# Patient Record
Sex: Male | Born: 1955
Health system: Southern US, Community
[De-identification: ages and names within clinical notes are randomized; demographics above are authoritative.]

## PROBLEM LIST (undated history)

## (undated) DIAGNOSIS — I219 Acute myocardial infarction, unspecified: Secondary | ICD-10-CM

## (undated) DIAGNOSIS — E119 Type 2 diabetes mellitus without complications: Secondary | ICD-10-CM

## (undated) DIAGNOSIS — K219 Gastro-esophageal reflux disease without esophagitis: Secondary | ICD-10-CM

## (undated) DIAGNOSIS — E1140 Type 2 diabetes mellitus with diabetic neuropathy, unspecified: Secondary | ICD-10-CM

## (undated) DIAGNOSIS — M1990 Unspecified osteoarthritis, unspecified site: Secondary | ICD-10-CM

## (undated) DIAGNOSIS — J810 Acute pulmonary edema: Secondary | ICD-10-CM

## (undated) DIAGNOSIS — I2510 Atherosclerotic heart disease of native coronary artery without angina pectoris: Secondary | ICD-10-CM

## (undated) DIAGNOSIS — I739 Peripheral vascular disease, unspecified: Secondary | ICD-10-CM

## (undated) DIAGNOSIS — I509 Heart failure, unspecified: Secondary | ICD-10-CM

## (undated) DIAGNOSIS — J439 Emphysema, unspecified: Secondary | ICD-10-CM

## (undated) DIAGNOSIS — J449 Chronic obstructive pulmonary disease, unspecified: Secondary | ICD-10-CM

## (undated) DIAGNOSIS — H9190 Unspecified hearing loss, unspecified ear: Secondary | ICD-10-CM

## (undated) DIAGNOSIS — E785 Hyperlipidemia, unspecified: Secondary | ICD-10-CM

## (undated) HISTORY — PX: OTHER SURGICAL HISTORY: SHX169

## (undated) HISTORY — DX: Type 2 diabetes mellitus without complications: E11.9

## (undated) HISTORY — PX: SPINE SURGERY: SHX786

## (undated) HISTORY — PX: KNEE ARTHROSCOPY: SHX127

---

## 2009-12-03 ENCOUNTER — Emergency Department (HOSPITAL_COMMUNITY): Admission: EM | Admit: 2009-12-03 | Discharge: 2009-12-03 | Payer: Self-pay | Admitting: Emergency Medicine

## 2013-11-26 ENCOUNTER — Ambulatory Visit: Payer: Self-pay | Admitting: Family Medicine

## 2013-11-26 ENCOUNTER — Ambulatory Visit: Payer: Self-pay

## 2013-11-26 VITALS — BP 130/70 | HR 89 | Temp 97.8°F | Resp 16 | Ht 68.25 in | Wt 223.4 lb

## 2013-11-26 DIAGNOSIS — E1140 Type 2 diabetes mellitus with diabetic neuropathy, unspecified: Secondary | ICD-10-CM

## 2013-11-26 DIAGNOSIS — M25551 Pain in right hip: Secondary | ICD-10-CM

## 2013-11-26 DIAGNOSIS — M1990 Unspecified osteoarthritis, unspecified site: Secondary | ICD-10-CM

## 2013-11-26 DIAGNOSIS — E1149 Type 2 diabetes mellitus with other diabetic neurological complication: Secondary | ICD-10-CM

## 2013-11-26 DIAGNOSIS — M25559 Pain in unspecified hip: Secondary | ICD-10-CM

## 2013-11-26 DIAGNOSIS — E119 Type 2 diabetes mellitus without complications: Secondary | ICD-10-CM

## 2013-11-26 DIAGNOSIS — M129 Arthropathy, unspecified: Secondary | ICD-10-CM

## 2013-11-26 LAB — COMPREHENSIVE METABOLIC PANEL WITH GFR
AST: 22 U/L (ref 0–37)
Albumin: 4.4 g/dL (ref 3.5–5.2)
Alkaline Phosphatase: 73 U/L (ref 39–117)
Glucose, Bld: 248 mg/dL — ABNORMAL HIGH (ref 70–99)
Potassium: 5 meq/L (ref 3.5–5.3)
Sodium: 137 meq/L (ref 135–145)
Total Protein: 7.7 g/dL (ref 6.0–8.3)

## 2013-11-26 LAB — POCT GLYCOSYLATED HEMOGLOBIN (HGB A1C): Hemoglobin A1C: 11.7

## 2013-11-26 LAB — COMPREHENSIVE METABOLIC PANEL
ALT: 42 U/L (ref 0–53)
BUN: 15 mg/dL (ref 6–23)
CO2: 25 mEq/L (ref 19–32)
Calcium: 9.3 mg/dL (ref 8.4–10.5)
Chloride: 104 mEq/L (ref 96–112)
Creat: 0.82 mg/dL (ref 0.50–1.35)
Total Bilirubin: 0.5 mg/dL (ref 0.3–1.2)

## 2013-11-26 MED ORDER — METFORMIN HCL 1000 MG PO TABS: 1000.0000 mg | ORAL_TABLET | Freq: Two times a day (BID) | ORAL | Status: DC

## 2013-11-26 MED ORDER — GABAPENTIN 100 MG PO CAPS: 100.0000 mg | ORAL_CAPSULE | Freq: Every day | ORAL | Status: DC

## 2013-11-26 NOTE — Progress Notes (Signed)
Chief Complaint:  Chief Complaint  Patient presents with  . Hip Pain    Right X 6 months, now pain travels down leg  . Arm Pain    Left forearm, tingling  . Cyst    On back, X 3 months  . Lab work    Wants to know blood sugar    HPI: Robert Sanchez is a 58 y.o. male who is here for : 1. Right hip pain x 6  Months, last 30 days he has had  Worsening pain going down from hip to leg, and he feels like his leg is dragging, it is  achey sometimes sharp burning pain. 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 After a long day he can feel it. He has not tried anything for this. He also has occ numbness and tingling in his hands. He denies any current back pain. 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 2. 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 Crawling and squatting. Prior to this about several years ago he had relief with steroid injection . Prior h/o cervical spine surgery from work comp injury 3. He has had diabetes for a long time--10 years ago. He was previosuly on medications. HE lost a lot of weight and his sugars were under control and was off of it for several years.    Past Medical History  Diagnosis Date  . Diabetes mellitus without complication    Past Surgical History  Procedure Laterality Date  . Spine surgery     History   Social History  . Marital Status: Single    Spouse Name: N/A    Number of Children: N/A  . Years of Education: N/A   Social History Main Topics  . Smoking status: Current Every Day Smoker  . Smokeless tobacco: None  . Alcohol Use: Yes  . Drug Use: No  . Sexual Activity: None   Other Topics Concern  . None   Social History Narrative  . None   Family History  Problem Relation Age of Onset  . Diabetes Mother   . Cancer Father   . Heart disease Father    No Known  Allergies Prior to Admission medications   Not on File     ROS: The patient denies fevers, chills, night sweats, unintentional weight loss, chest pain, palpitations, wheezing, dyspnea on exertion, nausea, vomiting, abdominal pain, dysuria, hematuria, melena, + numbness, weakness, or tingling.   All other systems have been reviewed and were otherwise negative with the exception of those mentioned in the HPI and as above.    PHYSICAL EXAM: Filed Vitals:   11/26/13 1046  BP: 130/70  Pulse: 89  Temp: 97.8 F (36.6 C)  Resp: 16   Filed Vitals:   11/26/13 1046  Height: 5' 8.25" (1.734 m)  Weight: 223 lb 6.4 oz (101.334 kg)   Body mass index is 33.7 kg/(m^2).  General: Alert, no acute distress HEENT:  Normocephalic, atraumatic, oropharynx patent. EOMI, PERRLA, fundoscopic exam nl Cardiovascular:  Regular rate and rhythm, no rubs murmurs or gallops.  No Carotid bruits, radial pulse intact. No pedal edema.  Respiratory: Clear to auscultation bilaterally.  No wheezes, rales, or rhonchi.  No cyanosis, no use of accessory musculature GI: No organomegaly, abdomen is soft and non-tender, positive bowel  sounds.  No masses. Skin: No rashes. Neurologic: Facial musculature symmetric. Microfilament exam nl Psychiatric: Patient is appropriate throughout our interaction. Lymphatic: No cervical lymphadenopathy Musculoskeletal: Gait intact. Neck exam -normal, neg spurling + minimal  paramsk L spine tenderness  Full ROM 5/5 strength, 2/2 DTRs No saddle anesthesia Straight leg negative Right Hip-nontender, full ROM, 5/5 strength, 2/2 DTR knee exam--normal   LABS: Results for orders placed in visit on 11/26/13  COMPREHENSIVE METABOLIC PANEL      Result Value Range   Sodium 137  135 - 145 mEq/L   Potassium 5.0  3.5 - 5.3 mEq/L   Chloride 104  96 - 112 mEq/L   CO2 25  19 - 32 mEq/L   Glucose, Bld 248 (*) 70 - 99 mg/dL   BUN 15  6 - 23 mg/dL   Creat 2.590.82  5.630.50 - 8.751.35 mg/dL   Total  Bilirubin 0.5  0.3 - 1.2 mg/dL   Alkaline Phosphatase 73  39 - 117 U/L   AST 22  0 - 37 U/L   ALT 42  0 - 53 U/L   Total Protein 7.7  6.0 - 8.3 g/dL   Albumin 4.4  3.5 - 5.2 g/dL   Calcium 9.3  8.4 - 64.310.5 mg/dL  POCT GLYCOSYLATED HEMOGLOBIN (HGB A1C)      Result Value Range   Hemoglobin A1C 11.7       EKG/XRAY:   Primary read interpreted by [facility]. No fractures or dislocation Minimal DJD and also spondy of l-spine   ASSESSMENT/PLAN: Encounter Diagnoses  Name Primary?  . Hip pain, right Yes  . Diabetes   . Arthritis   . Neuropathy in diabetes    Restart Dm meds, Rx metformin 1 gram BID Rx Gabapentin He will get glucose monitor and call me with fasting glucose logs  Recommend : ADA diet, BP goal <140/90, daily foot exams, tobacco cessation if smoking, annual eye exam, annual flu vaccine, PNA vaccine if age and time appropriate.  I advise him to take otc tylenol and ibuprofen prn for hip pain, hesitate to give steroid injection if this is really just DM neuropathy. He has bilaterla hand numbness and tingling which may be related to prior c spin injury but I will recheck this in 2 weeks when he calls with fasting sugars and he has tried the gabapentin.  He has 2 large sebacous cyst that had a black head in it which I was able to express most of the cyst , he had 2 large balckheads whoich I was able to express partially. Patietn gave verbal consent manually drain cyst and black head and knows the cyst may return since we did not do an incision, he is self pay so will not do anything surgical since it is a cosmetic eye sore and does not bother him. Prepped and cleaned with alcohol swab.  F/u by phone with fasting gluocose in 2 weeks F/u in 3 month for office visit, CMP pendingR  Gross sideeffects, risk and benefits, and alternatives of medications d/w patient. Patient is aware that all medications have potential sideeffects and we are unable to predict every sideeffect or  drug-drug interaction that may occur.  Druanne Bosques PHUONG, DO 11/27/2013 10:56 AM

## 2013-11-27 DIAGNOSIS — E119 Type 2 diabetes mellitus without complications: Secondary | ICD-10-CM | POA: Insufficient documentation

## 2013-11-27 DIAGNOSIS — E1140 Type 2 diabetes mellitus with diabetic neuropathy, unspecified: Secondary | ICD-10-CM | POA: Insufficient documentation

## 2013-11-27 DIAGNOSIS — M1990 Unspecified osteoarthritis, unspecified site: Secondary | ICD-10-CM | POA: Insufficient documentation

## 2013-11-27 DIAGNOSIS — E1169 Type 2 diabetes mellitus with other specified complication: Secondary | ICD-10-CM | POA: Insufficient documentation

## 2014-08-14 ENCOUNTER — Ambulatory Visit (INDEPENDENT_AMBULATORY_CARE_PROVIDER_SITE_OTHER): Payer: Self-pay | Admitting: Family Medicine

## 2014-08-14 VITALS — BP 130/80 | HR 92 | Temp 98.2°F | Resp 18 | Ht 68.0 in | Wt 223.0 lb

## 2014-08-14 DIAGNOSIS — M25551 Pain in right hip: Secondary | ICD-10-CM

## 2014-08-14 DIAGNOSIS — E1140 Type 2 diabetes mellitus with diabetic neuropathy, unspecified: Secondary | ICD-10-CM

## 2014-08-14 DIAGNOSIS — M1990 Unspecified osteoarthritis, unspecified site: Secondary | ICD-10-CM

## 2014-08-14 DIAGNOSIS — G609 Hereditary and idiopathic neuropathy, unspecified: Secondary | ICD-10-CM

## 2014-08-14 DIAGNOSIS — Z2821 Immunization not carried out because of patient refusal: Secondary | ICD-10-CM

## 2014-08-14 DIAGNOSIS — Z9119 Patient's noncompliance with other medical treatment and regimen: Secondary | ICD-10-CM

## 2014-08-14 DIAGNOSIS — Z91199 Patient's noncompliance with other medical treatment and regimen due to unspecified reason: Secondary | ICD-10-CM

## 2014-08-14 LAB — POCT GLYCOSYLATED HEMOGLOBIN (HGB A1C): Hemoglobin A1C: 12.5

## 2014-08-14 MED ORDER — TRAMADOL HCL 50 MG PO TABS
50.0000 mg | ORAL_TABLET | Freq: Three times a day (TID) | ORAL | Status: DC | PRN
Start: 2014-08-14 — End: 2016-08-23

## 2014-08-14 MED ORDER — METFORMIN HCL 1000 MG PO TABS: 1000.0000 mg | ORAL_TABLET | Freq: Two times a day (BID) | ORAL | Status: DC

## 2014-08-14 MED ORDER — LISINOPRIL 2.5 MG PO TABS: 2.5000 mg | ORAL_TABLET | Freq: Every day | ORAL | Status: DC

## 2014-08-14 MED ORDER — GABAPENTIN 100 MG PO CAPS: 100.0000 mg | ORAL_CAPSULE | Freq: Every day | ORAL | Status: DC

## 2014-08-14 NOTE — Progress Notes (Signed)
Chief Complaint:  Chief Complaint  Patient presents with  . Peripheral Neuropathy    pain in hip and leg  . med refills    HPI: Robert Sanchez is a 58 y.o. male who is here for : 1 Diabetes recheck -he has bnee without mds for 3 months, I acutally gave hm 6 montsh of medicine but he did not know he had a refill. He was taking metformin 1 g BID . Last A1c was about 11. He has not been watching his eating or exercising, he doe snot take his blood sugar at home, he has leg pain and neuropathy, he was given gabapentin to try but it did not help , dose was 100 mg daily 2. Right hip pain and radicular pain going down right leg, he denies numbness or tinglign in his toes or hands, he states his hip hurts him the worst when he is going up and down stairs.   Last OV was 11/26/2013 Robert Sanchez is a 58 y.o. male who is here for :  1. Right hip pain x 6 Months, last 30 days he has had Worsening pain going down from hip to leg, and he feels like his leg is dragging, it is achey sometimes sharp burning pain. 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 After a long day he can feel it. He has not tried anything for this. He also has occ numbness and tingling in his hands. He denies any current back pain. 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  2. 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 Crawling and squatting. Prior to this about several years ago he had relief with steroid injection . Prior h/o cervical spine surgery from work comp injury  3. He has had diabetes for a long time--10 years ago. He was previosuly on medications. HE lost a lot of weight and his sugars were under control and was off of it for several years.    Past Medical History  Diagnosis Date  . Diabetes mellitus without complication    Past Surgical History  Procedure  Laterality Date  . Spine surgery     History   Social History  . Marital Status: Single    Spouse Name: N/A    Number of Children: N/A  . Years of Education: N/A   Social History Main Topics  . Smoking status: Current Every Day Smoker  . Smokeless tobacco: None  . Alcohol Use: Yes  . Drug Use: No  . Sexual Activity: None   Other Topics Concern  . None   Social History Narrative  . None   Family History  Problem Relation Age of Onset  . Diabetes Mother   . Cancer Father   . Heart disease Father    No Known Allergies Prior to Admission medications   Medication Sig Start Date End Date Taking? Authorizing Provider  gabapentin (NEURONTIN) 100 MG capsule Take 1 capsule (100 mg total) by mouth at bedtime. 11/26/13  Yes Thao P Le, DO  metFORMIN (GLUCOPHAGE) 1000 MG tablet Take 1 tablet (1,000 mg total) by mouth 2 (two) times daily with a meal. 11/26/13  Yes Thao P Le, DO     ROS: The patient denies fevers, chills, night sweats, unintentional weight loss, chest pain, palpitations, wheezing, dyspnea  on exertion, nausea, vomiting, abdominal pain, dysuria, hematuria, melena, numbness, weakness, or tingling. + pain   All other systems have been reviewed and were otherwise negative with the exception of those mentioned in the HPI and as above.    PHYSICAL EXAM: Filed Vitals:   08/14/14 0924  BP: 130/80  Pulse: 92  Temp: 98.2 F (36.8 C)  Resp: 18   Filed Vitals:   08/14/14 0924  Height: 5\' 8"  (1.727 m)  Weight: 223 lb (101.152 kg)   Body mass index is 33.91 kg/(m^2).  General: Alert, no acute distress HEENT:  Normocephalic, atraumatic, oropharynx patent. EOMI, PERRLA Cardiovascular:  Regular rate and rhythm, no rubs murmurs or gallops.  No Carotid bruits, radial pulse intact. No pedal edema.  Respiratory: Clear to auscultation bilaterally.  No wheezes, rales, or rhonchi.  No cyanosis, no use of accessory musculature GI: No organomegaly, abdomen is soft and non-tender,  positive bowel sounds.  No masses. Skin: No rashes. Neurologic: Facial musculature symmetric. + microfilament exam  Psychiatric: Patient is appropriate throughout our interaction. Lymphatic: No cervical lymphadenopathy Musculoskeletal: Gait intact.   LABS: Results for orders placed in visit on 08/14/14  POCT GLYCOSYLATED HEMOGLOBIN (HGB A1C)      Result Value Ref Range   Hemoglobin A1C 12.5       EKG/XRAY:   Primary read interpreted by [facility].   ASSESSMENT/PLAN: Encounter Diagnoses  Name Primary?  . Type 2 diabetes mellitus with diabetic neuropathy Yes  . Right hip pain   . Arthritis   . 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  Refilled meds: metformin 1 gram BID, gabapentin 100 mg daily may increase to 200 mg if able to tolerate, he can increase to 300 mg but I would like to see if thsi helps.  Rx tramadol 50 mg for severe hip pain with precautions Rx Lisinopril 2.5 mg daily for renal protection, he is an uncontrolled diabetic , smoker so will do this as ppx. Return in 1 month for fasting labs only of his CMP to check glucose renal function, if needed then will also add glipizide  I have advise him on the importance of controlling his blood sugars, also advise to get flu vaccine but declined Recommend : ADA diet, BP goal <140/90, daily foot exams, tobacco cessation if smoking, annual eye exam, annual flu vaccine, PNA vaccine if age and time appropriate.  Otherwise in 3 months  Gross sideeffects, risk and benefits, and alternatives of medications d/w patient. Patient is aware that all medications have potential sideeffects and we are  unable to predict every sideeffect or drug-drug interaction that may occur.  LE, THAO PHUONG, DO 08/14/2014 11:58 AM

## 2014-08-15 LAB — COMPLETE METABOLIC PANEL WITH GFR
ALT: 42 U/L (ref 0–53)
Albumin: 4.4 g/dL (ref 3.5–5.2)
CO2: 21 mEq/L (ref 19–32)
Calcium: 9.7 mg/dL (ref 8.4–10.5)
Chloride: 104 mEq/L (ref 96–112)
GFR, Est African American: >89 mL/min
GFR, Est Non African American: >89 mL/min
Glucose, Bld: 331 mg/dL — ABNORMAL HIGH (ref 70–99)
Potassium: 4.9 mEq/L (ref 3.5–5.3)
Sodium: 134 mEq/L — ABNORMAL LOW (ref 135–145)
Total Protein: 7.9 g/dL (ref 6.0–8.3)

## 2014-08-15 LAB — COMPLETE METABOLIC PANEL WITHOUT GFR
AST: 24 U/L (ref 0–37)
Alkaline Phosphatase: 84 U/L (ref 39–117)
BUN: 16 mg/dL (ref 6–23)
Creat: 0.81 mg/dL (ref 0.50–1.35)
Total Bilirubin: 0.6 mg/dL (ref 0.2–1.2)

## 2014-08-16 ENCOUNTER — Encounter: Payer: Self-pay | Admitting: Family Medicine

## 2016-01-16 DIAGNOSIS — L732 Hidradenitis suppurativa: Secondary | ICD-10-CM | POA: Insufficient documentation

## 2016-08-23 ENCOUNTER — Ambulatory Visit (INDEPENDENT_AMBULATORY_CARE_PROVIDER_SITE_OTHER): Payer: Self-pay | Admitting: Family Medicine

## 2016-08-23 VITALS — BP 150/70 | HR 78 | Temp 97.8°F | Resp 18 | Ht 68.0 in | Wt 222.0 lb

## 2016-08-23 DIAGNOSIS — M25551 Pain in right hip: Secondary | ICD-10-CM

## 2016-08-23 DIAGNOSIS — L723 Sebaceous cyst: Secondary | ICD-10-CM

## 2016-08-23 DIAGNOSIS — M25561 Pain in right knee: Secondary | ICD-10-CM | POA: Insufficient documentation

## 2016-08-23 DIAGNOSIS — E1140 Type 2 diabetes mellitus with diabetic neuropathy, unspecified: Secondary | ICD-10-CM

## 2016-08-23 DIAGNOSIS — L732 Hidradenitis suppurativa: Secondary | ICD-10-CM

## 2016-08-23 DIAGNOSIS — E118 Type 2 diabetes mellitus with unspecified complications: Secondary | ICD-10-CM

## 2016-08-23 LAB — GLUCOSE, POCT (MANUAL RESULT ENTRY): POC GLUCOSE: 313 mg/dL — AB (ref 70–99)

## 2016-08-23 LAB — POCT GLYCOSYLATED HEMOGLOBIN (HGB A1C): HEMOGLOBIN A1C: 13.1

## 2016-08-23 MED ORDER — METFORMIN HCL 1000 MG PO TABS: 1000.0000 mg | ORAL_TABLET | Freq: Two times a day (BID) | ORAL | 1 refills | Status: DC

## 2016-08-23 MED ORDER — DOXYCYCLINE HYCLATE 50 MG PO CAPS: 50.0000 mg | ORAL_CAPSULE | Freq: Two times a day (BID) | ORAL | 0 refills | Status: DC

## 2016-08-23 MED ORDER — TRAMADOL HCL 50 MG PO TABS: 50.0000 mg | ORAL_TABLET | Freq: Three times a day (TID) | ORAL | 0 refills | Status: DC | PRN

## 2016-08-23 NOTE — Assessment & Plan Note (Signed)
Acute on chronic knee pain that is likely related to arthritic change with possible meniscal pain. Holding on x-rays today for now.  - tramadol for pain  - if he gets his diabetes under control then would consider steroid injections or visco supplementation.

## 2016-08-23 NOTE — Assessment & Plan Note (Signed)
Uncontrolled. He hasn't taken medications in two months. He has no insurance so he has problems with obtaining medications  - restart metformin.  - likely needs lantus but follow up and finances are obstacles.  - f/u in two weeks.

## 2016-08-23 NOTE — Patient Instructions (Addendum)
Thank you for coming in,   Please follow up with us in two weeks.   Please establish care [facility] or [facility].   [facility]  [address] [place], [place], [place] [address] [phone]  Please feel free to call with any questions or concerns at any time, at [phone].  Please get a reli-on meter and strips at [facility].   --Dr. Jordan LikesSchmitz  Diet Recommendations for Diabetes   Starchy (carb) foods include: Bread, rice, pasta, potatoes, corn, crackers, bagels, muffins, all baked goods.   Protein foods include: Meat, fish, poultry, eggs, dairy foods, and beans such as pinto and kidney beans (beans also provide carbohydrate).   1. Eat at least 3 meals and 1-2 snacks per day. Never go more than 4-5 hours while awake without eating.  2. Limit starchy foods to TWO per meal and ONE per snack. ONE portion of a starchy  food is equal to the following:   - ONE slice of bread (or its equivalent, such as half of a hamburger bun).   - 1/2 cup of a "scoopable" starchy food such as potatoes or rice.   - 1 OUNCE (28 grams) of starchy snack foods such as crackers or pretzels (look on label).   - 15 grams of carbohydrate as shown on food label.  3. Both lunch and dinner should include a protein food, a carb food, and vegetables.   - Obtain twice as many veg's as protein or carbohydrate foods for both lunch and dinner.   - Try to keep frozen veg's on hand for a quick vegetable serving.     - Fresh or frozen veg's are best.  4. Breakfast should always include protein.      IF you received an x-ray today, you will receive an invoice from [facility]. Please contact [facility] at [phone] with questions or concerns regarding your invoice.   IF you received labwork today, you will receive an invoice from [facility]. Please contact [facility] at [phone] with questions or concerns regarding your invoice.    Our billing staff will not be able to assist you with questions regarding bills from these companies.  You will be contacted with the lab results as soon as they are available. The fastest way to get your results is to activate your My Chart account. Instructions are located on the last page of this paperwork. If you have not heard from us regarding the results in 2 weeks, please contact this office.

## 2016-08-23 NOTE — Progress Notes (Signed)
Subjective:    Patient ID: Robert Sanchez, male    DOB: 1955-11-21, 60 y.o.   MRN: [id number]  Chief Complaint  Patient presents with  . Knee Pain    x 2 months, Neuropathy in Right leg  . Medication Refill    metformin, gabapentin, doxycycline hyc 50 mg    PCP: No primary care provider on file.  HPI  This is a 60 y.o. male who is presenting with knee pain. Having pain in his right knee for two months. He almost isn't able to work now secondary to pain. He has pain on the medial aspect of his right knee. The pain started gradual in nature. It feels swollen and tender to touch. He isn't taking any medications. The pain is constant and throbs. He isn't able to sleep. He has to change positions in bed to fall asleep.   He currently doesn't have healthy insurance and has [facility] to [facility]. He has uncontrolled diabetes. He has an abscess on his back and has scrotal hidradenitis. He was supposed to have surgery on this but it was canceled secondary to his uncontrolled dm. He was taking metformin, gabapentin and doxycycline but has been out of them for at least two months. He reports his last A1c was 13.   He reports having neuropathy down his right leg. He reports he was diagnosed with neuropathy that starts from his right hip to his leg. He reports little to no symptoms on his left side. He reports the right side of his shoe will wear out before any other part of the shoe. He thinks he has had to change the way he walks.    Review of Systems  PMH: type 2 dm. 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 1 ppd smoker, occasional alcohol use  FHx: heart disease, cancer     Objective:   Physical Exam BP (!) 150/70   Pulse 78   Temp 97.8 F (36.6 C) (Oral)   Resp 18   Ht 5\' 8"  (1.727 m)   Wt 222 lb (100.7 kg)   SpO2 98%   BMI 33.75 kg/m  Gen: NAD, alert, cooperative with exam,  HEENT:   EOMI, clear conjunctiva,  CV: RRR, good S1/S2, no murmur, no edema, capillary refill brisk  Resp: CTABL, no wheezes, non-labored Skin: two sebaceous cysts on his midline thoracic back. No streaking or active draining of them.  Several hidradenitis tracks in his left groin and left scrotum No active draining or erythema. No pain to palpation of scrotum  No swelling of scrotum.  Neuro: normal strength in lower extremities  Monofilament testing was performed and documented in chart.  Psych: good insight, alert and oriented Knee:  No obvious swelling.  Chronic thickened callus skin on b/l anterior knees.  Unable to achieve full extension  Pain with internal rotation of the right hip.  Significant pain with palpation on the medial aspect.  No TTP of the lateral aspect  Pain with valgus stress testing  Some pain with McMurray's test  Pain with straight leg raise but likely due to his knee pain.        Assessment & Plan:   Diabetes Uncontrolled. He hasn't taken medications in two months. He has no insurance so he has problems with obtaining medications  - restart metformin.  - likely needs lantus but follow up and finances are  obstacles.  - f/u in two weeks.   Acute pain of right knee Acute on chronic knee pain that is likely related to arthritic change with possible meniscal pain. Holding on x-rays today for now.  - tramadol for pain  - if he gets his diabetes under control then would consider steroid injections or visco supplementation.    Sebaceous cyst Doesn't appear to be acutely infected on his midline thoracic back but likely will need to be drained at follow up if not improving.     Hidradenitis Occurring in his left groin and scrotum. He was being seen at [facility] and had a planned surgery but he was unable to get his blood sugar under control  - resumed doxycycline today for 21 days.  - advised he follow up with [place] or establish with [facility] so he  can get his diabetes under control and be able to consider surgery again.

## 2016-08-23 NOTE — Assessment & Plan Note (Signed)
Doesn't appear to be acutely infected on his midline thoracic back but likely will need to be drained at follow up if not improving.

## 2016-08-23 NOTE — Assessment & Plan Note (Signed)
Occurring in his left groin and scrotum. [facility] and had a planned surgery but he was unable to get his blood sugar under control  - resumed doxycycline today for 21 days.  - advised he follow up with us or establish with [facility] so he can get his diabetes under control and be able to consider surgery again.

## 2016-08-23 NOTE — Progress Notes (Signed)
Patient discussed with Dr. Jordan LikesSchmitz. Agree with findings, assessment and plan of care per his note.   Signed,   Meredith StaggersJeffrey Jesslyn Viglione, MD [facility].  08/23/16 3:59 PM

## 2017-08-29 ENCOUNTER — Ambulatory Visit (INDEPENDENT_AMBULATORY_CARE_PROVIDER_SITE_OTHER): Payer: Self-pay | Admitting: Physician Assistant

## 2017-08-29 ENCOUNTER — Encounter: Payer: Self-pay | Admitting: Physician Assistant

## 2017-08-29 VITALS — BP 162/82 | HR 100 | Temp 98.2°F | Resp 16 | Ht 68.0 in | Wt 220.0 lb

## 2017-08-29 DIAGNOSIS — H6122 Impacted cerumen, left ear: Secondary | ICD-10-CM

## 2017-08-29 DIAGNOSIS — T162XXA Foreign body in left ear, initial encounter: Secondary | ICD-10-CM

## 2017-08-29 MED ORDER — HYDROCORTISONE-ACETIC ACID 1-2 % OT SOLN: 4.0000 [drp] | Freq: Two times a day (BID) | OTIC | 0 refills | Status: DC

## 2017-08-29 NOTE — Patient Instructions (Addendum)
You do not need to use longer than one week.   You can take ibuprofen for your pain and fever  Ear Foreign Body An ear foreign body is an object that is stuck in your ear. Objects in your ear can cause:  Pain.  Buzzing or roaring sounds.  Hearing loss.  Fluid coming from your ear (drainage) or bleeding.  Feeling sick to your stomach (nausea) or throwing up (vomiting).  A feeling that your ear is full.  Follow these instructions at home:  Keep all follow-up visits as told by your doctor. This is important.  Take medicines only as told by your doctor.  If you were prescribed an antibiotic medicine, finish it all even if you start to feel better. Contact a doctor if:  You have a headache.  Your have blood coming from your ear.  You have a fever.  You have increased pain or swelling of your ear.  Your hearing is reduced.  You have discharge coming from your ear. This information is not intended to replace advice given to you by your health care provider. Make sure you discuss any questions you have with your health care provider. Document Released: 04/07/2010 Document Revised: 03/25/2016 Document Reviewed: 06/03/2014 Elsevier Interactive Patient Education  2018 [employer]     IF you received an x-ray today, you will receive an invoice from [facility]. Please contact [facility] at [phone] with questions or concerns regarding your invoice.   IF you received labwork today, you will receive an invoice from [facility]. Please contact [facility] at [phone] with questions or concerns regarding your invoice.   Our billing staff will not be able to assist you with questions regarding bills from these companies.  You will be contacted with the lab results as soon as they are available. The fastest way to get your results is to activate your My Chart account. Instructions are located on the last page of this paperwork. If you have not heard from us  regarding the results in 2 weeks, please contact this office.

## 2017-08-29 NOTE — Progress Notes (Signed)
[facility] [address], [place] [address] [phone]  Date:  08/29/2017   Name:  Ronda Fairlyhomas E Antonopoulos   DOB:  1956/02/01   MRN:  [id number]  PCP:  Patient, No Pcp Per    History of Present Illness:  Ronda Fairlyhomas E Finks is a 61 y.o. male patient who presents to PCP with  Chief Complaint  Patient presents with  . Ear Pain    possible infection/ left. x1 wk     Patient is here for 1 week of left ear pain.   No drainage from the ear.  He has had lack of hearing as well. He reports that he has no fever. He tried to clear with OTC cerumenolytics drops.   He states that more than one week ago, he had qtips in his ear, and the whole cotton came off in his ear.    Patient Active Problem List   Diagnosis Date Noted  . Sebaceous cyst 08/23/2016  . Acute pain of right knee 08/23/2016  . Hidradenitis 08/23/2016  . Right hip pain 08/14/2014  . Diabetes (HCC) 11/27/2013  . Arthritis 11/27/2013  . Neuropathy in diabetes (HCC) 11/27/2013    Past Medical History:  Diagnosis Date  . Diabetes mellitus without complication [facility](HCC)     Past Surgical History:  Procedure Laterality Date  . SPINE SURGERY      Social History  Substance Use Topics  . Smoking status: Current Every Day Smoker  . Smokeless tobacco: Never Used  . Alcohol use Yes    Family History  Problem Relation Age of Onset  . Diabetes Mother   . Cancer Father   . Heart disease Father     Allergies  Allergen Reactions  . Lisinopril Other (See Comments)    Syncope    Medication list has been reviewed and updated.  Current Outpatient Prescriptions on File Prior to Visit  Medication Sig Dispense Refill  . metFORMIN (GLUCOPHAGE) 1000 MG tablet Take 1 tablet (1,000 mg total) by mouth 2 (two) times daily with a meal. 180 tablet 1  . traMADol (ULTRAM) 50 MG tablet Take 1 tablet (50 mg total) by mouth every 8 (eight) hours as needed. May cause constipation 30 tablet 0  . gabapentin (NEURONTIN) 100 MG  capsule Take 1 capsule (100 mg total) by mouth at bedtime. (Patient not taking: Reported on 08/29/2017) 90 capsule 0  . lisinopril (ZESTRIL) 2.5 MG tablet Take 1 tablet (2.5 mg total) by mouth daily. (Patient not taking: Reported on 08/23/2016) 90 tablet 1   No current facility-administered medications on file prior to visit.     ROS ROS otherwise unremarkable unless listed above.  Physical Examination: BP (!) 162/82   Pulse 100   Temp 98.2 F (36.8 C) (Oral)   Resp 16   Ht 5\' 8"  (1.727 m)   Wt 220 lb (99.8 kg)   SpO2 98%   BMI 33.45 kg/m  Ideal Body Weight: Weight in (lb) to have BMI = 25: 164.1  Physical Exam  Constitutional: He is oriented to person, place, and time. He appears well-developed and well-nourished. No distress.  HENT:  Head: Normocephalic and atraumatic.  Right Ear: Tympanic membrane, external ear and ear canal normal.  Left Ear: External ear normal. No mastoid tenderness.  Left canal with significant cerumen impaction.  With irrigation, cotton materials are removed.  Notes improvement post irrigation.  Canal minimally erythematous, with some soreness as well.   Eyes: Pupils are equal, round, and reactive to  light. Conjunctivae and EOM are normal.  Cardiovascular: Normal rate and regular rhythm.   No murmur heard. Pulmonary/Chest: Effort normal. No respiratory distress.  Lymphadenopathy:       Head (left side): No preauricular and no posterior auricular adenopathy present.  Neurological: He is alert and oriented to person, place, and time.  Skin: Skin is warm and dry. He is not diaphoretic.  Psychiatric: He has a normal mood and affect. His behavior is normal.     Assessment and Plan: BELA NYBORG is a 61 y.o. male who is here today for cc of  Chief Complaint  Patient presents with  . Ear Pain    possible infection/ left. x1 wk  reports relief with irrigation.  Vosol and nsaid use advised and prescribed. Impacted cerumen of left ear - Plan: acetic  acid-hydrocortisone (VOSOL-HC) OTIC solution  Foreign body of left ear, initial encounter - Plan: acetic acid-hydrocortisone (VOSOL-HC) OTIC solution  Trena Platt, PA-C [facility] 10/29/20181:18 PM

## 2018-01-30 ENCOUNTER — Encounter: Payer: Self-pay | Admitting: Physician Assistant

## 2018-06-03 ENCOUNTER — Encounter (HOSPITAL_COMMUNITY): Payer: Self-pay

## 2018-06-03 ENCOUNTER — Other Ambulatory Visit: Payer: Self-pay

## 2018-06-03 ENCOUNTER — Inpatient Hospital Stay (HOSPITAL_COMMUNITY)
Admission: EM | Admit: 2018-06-03 | Discharge: 2018-06-06 | DRG: 286 | Discharge status: Against Medical Advice | Payer: Self-pay | Attending: Cardiovascular Disease | Admitting: Cardiovascular Disease

## 2018-06-03 ENCOUNTER — Emergency Department (HOSPITAL_COMMUNITY): Payer: Self-pay

## 2018-06-03 DIAGNOSIS — E1110 Type 2 diabetes mellitus with ketoacidosis without coma: Secondary | ICD-10-CM | POA: Diagnosis present

## 2018-06-03 DIAGNOSIS — I340 Nonrheumatic mitral (valve) insufficiency: Secondary | ICD-10-CM | POA: Diagnosis present

## 2018-06-03 DIAGNOSIS — R0902 Hypoxemia: Secondary | ICD-10-CM | POA: Diagnosis present

## 2018-06-03 DIAGNOSIS — J811 Chronic pulmonary edema: Secondary | ICD-10-CM | POA: Diagnosis present

## 2018-06-03 DIAGNOSIS — I5023 Acute on chronic systolic (congestive) heart failure: Secondary | ICD-10-CM | POA: Diagnosis present

## 2018-06-03 DIAGNOSIS — J810 Acute pulmonary edema: Secondary | ICD-10-CM | POA: Diagnosis present

## 2018-06-03 DIAGNOSIS — I2510 Atherosclerotic heart disease of native coronary artery without angina pectoris: Secondary | ICD-10-CM | POA: Diagnosis present

## 2018-06-03 DIAGNOSIS — I248 Other forms of acute ischemic heart disease: Secondary | ICD-10-CM | POA: Diagnosis present

## 2018-06-03 DIAGNOSIS — Z9114 Patient's other noncompliance with medication regimen: Secondary | ICD-10-CM

## 2018-06-03 DIAGNOSIS — E1141 Type 2 diabetes mellitus with diabetic mononeuropathy: Secondary | ICD-10-CM | POA: Diagnosis present

## 2018-06-03 DIAGNOSIS — Z9111 Patient's noncompliance with dietary regimen: Secondary | ICD-10-CM

## 2018-06-03 DIAGNOSIS — I249 Acute ischemic heart disease, unspecified: Secondary | ICD-10-CM

## 2018-06-03 DIAGNOSIS — Z23 Encounter for immunization: Secondary | ICD-10-CM

## 2018-06-03 DIAGNOSIS — I110 Hypertensive heart disease with heart failure: Principal | ICD-10-CM | POA: Diagnosis present

## 2018-06-03 DIAGNOSIS — Z7984 Long term (current) use of oral hypoglycemic drugs: Secondary | ICD-10-CM

## 2018-06-03 DIAGNOSIS — E1310 Other specified diabetes mellitus with ketoacidosis without coma: Secondary | ICD-10-CM

## 2018-06-03 DIAGNOSIS — F17210 Nicotine dependence, cigarettes, uncomplicated: Secondary | ICD-10-CM | POA: Diagnosis present

## 2018-06-03 DIAGNOSIS — I509 Heart failure, unspecified: Secondary | ICD-10-CM

## 2018-06-03 DIAGNOSIS — Z79899 Other long term (current) drug therapy: Secondary | ICD-10-CM

## 2018-06-03 DIAGNOSIS — E669 Obesity, unspecified: Secondary | ICD-10-CM | POA: Diagnosis present

## 2018-06-03 DIAGNOSIS — Z6832 Body mass index (BMI) 32.0-32.9, adult: Secondary | ICD-10-CM

## 2018-06-03 HISTORY — DX: Unspecified osteoarthritis, unspecified site: M19.90

## 2018-06-03 HISTORY — DX: Acute pulmonary edema: J81.0

## 2018-06-03 HISTORY — DX: Type 2 diabetes mellitus with diabetic neuropathy, unspecified: E11.40

## 2018-06-03 LAB — CBC
HEMATOCRIT: 47.2 % (ref 39.0–52.0)
Hemoglobin: 15.5 g/dL (ref 13.0–17.0)
MCH: 30.8 pg (ref 26.0–34.0)
MCHC: 32.8 g/dL (ref 30.0–36.0)
MCV: 93.7 fL (ref 78.0–100.0)
Platelets: 296 10*3/uL (ref 150–400)
RBC: 5.04 MIL/uL (ref 4.22–5.81)
RDW: 13.2 % (ref 11.5–15.5)
WBC: 13.6 10*3/uL — AB (ref 4.0–10.5)

## 2018-06-03 LAB — GLUCOSE, CAPILLARY
GLUCOSE-CAPILLARY: 258 mg/dL — AB (ref 70–99)
GLUCOSE-CAPILLARY: 116 mg/dL — AB (ref 70–99)
GLUCOSE-CAPILLARY: 173 mg/dL — AB (ref 70–99)
GLUCOSE-CAPILLARY: 201 mg/dL — AB (ref 70–99)
Glucose-Capillary: 402 mg/dL — ABNORMAL HIGH (ref 70–99)
Glucose-Capillary: 296 mg/dL — ABNORMAL HIGH (ref 70–99)
Glucose-Capillary: 252 mg/dL — ABNORMAL HIGH (ref 70–99)
Glucose-Capillary: 141 mg/dL — ABNORMAL HIGH (ref 70–99)
Glucose-Capillary: 129 mg/dL — ABNORMAL HIGH (ref 70–99)

## 2018-06-03 LAB — URINALYSIS, ROUTINE W REFLEX MICROSCOPIC
BILIRUBIN URINE: NEGATIVE
Bacteria, UA: NONE SEEN
Glucose, UA: >=500 mg/dL — AB
HGB URINE DIPSTICK: NEGATIVE
Ketones, ur: NEGATIVE mg/dL
LEUKOCYTES UA: NEGATIVE
Nitrite: NEGATIVE
Protein, ur: NEGATIVE mg/dL
SPECIFIC GRAVITY, URINE: 1.016 (ref 1.005–1.030)
pH: 5 (ref 5.0–8.0)

## 2018-06-03 LAB — BASIC METABOLIC PANEL
ANION GAP: 13 (ref 5–15)
ANION GAP: 13 (ref 5–15)
BUN: 12 mg/dL (ref 6–20)
BUN: 13 mg/dL (ref 6–20)
CHLORIDE: 99 mmol/L (ref 98–111)
CO2: 22 mmol/L (ref 22–32)
CO2: 21 mmol/L — ABNORMAL LOW (ref 22–32)
Calcium: 9 mg/dL (ref 8.9–10.3)
Calcium: 8.9 mg/dL (ref 8.9–10.3)
Chloride: 105 mmol/L (ref 98–111)
Creatinine, Ser: 1.13 mg/dL (ref 0.61–1.24)
Creatinine, Ser: 1.05 mg/dL (ref 0.61–1.24)
Glucose, Bld: 528 mg/dL (ref 70–99)
Glucose, Bld: 396 mg/dL — ABNORMAL HIGH (ref 70–99)
POTASSIUM: 4.7 mmol/L (ref 3.5–5.1)
POTASSIUM: 4.3 mmol/L (ref 3.5–5.1)
SODIUM: 134 mmol/L — AB (ref 135–145)
SODIUM: 139 mmol/L (ref 135–145)

## 2018-06-03 LAB — HEMOGLOBIN A1C
Hgb A1c MFr Bld: 12.4 % — ABNORMAL HIGH (ref 4.8–5.6)
Mean Plasma Glucose: 309.18 mg/dL

## 2018-06-03 LAB — CBC WITH DIFFERENTIAL/PLATELET
Abs Immature Granulocytes: 0.1 10*3/uL (ref 0.0–0.1)
BASOS PCT: 1 %
Basophils Absolute: 0.1 10*3/uL (ref 0.0–0.1)
EOS ABS: 0.3 10*3/uL (ref 0.0–0.7)
EOS PCT: 2 %
HCT: 47.2 % (ref 39.0–52.0)
Hemoglobin: 15 g/dL (ref 13.0–17.0)
IMMATURE GRANULOCYTES: 1 %
LYMPHS ABS: 4.8 10*3/uL — AB (ref 0.7–4.0)
Lymphocytes Relative: 28 %
MCH: 30.7 pg (ref 26.0–34.0)
MCHC: 31.8 g/dL (ref 30.0–36.0)
MCV: 96.5 fL (ref 78.0–100.0)
MONO ABS: 1.3 10*3/uL — AB (ref 0.1–1.0)
MONOS PCT: 7 %
NEUTROS PCT: 61 %
Neutro Abs: 10.6 10*3/uL — ABNORMAL HIGH (ref 1.7–7.7)
PLATELETS: 336 10*3/uL (ref 150–400)
RBC: 4.89 MIL/uL (ref 4.22–5.81)
RDW: 13.1 % (ref 11.5–15.5)
WBC: 17.1 10*3/uL — ABNORMAL HIGH (ref 4.0–10.5)

## 2018-06-03 LAB — CBG MONITORING, ED
GLUCOSE-CAPILLARY: 418 mg/dL — AB (ref 70–99)
Glucose-Capillary: 580 mg/dL (ref 70–99)
Glucose-Capillary: 477 mg/dL — ABNORMAL HIGH (ref 70–99)

## 2018-06-03 LAB — TSH: TSH: 0.733 u[IU]/mL (ref 0.350–4.500)

## 2018-06-03 LAB — COMPREHENSIVE METABOLIC PANEL
ALBUMIN: 3.6 g/dL (ref 3.5–5.0)
ALK PHOS: 109 U/L (ref 38–126)
ALT: 80 U/L — AB (ref 0–44)
ANION GAP: 18 — AB (ref 5–15)
AST: 93 U/L — ABNORMAL HIGH (ref 15–41)
BILIRUBIN TOTAL: 0.8 mg/dL (ref 0.3–1.2)
BUN: 10 mg/dL (ref 6–20)
CALCIUM: 9 mg/dL (ref 8.9–10.3)
CO2: 17 mmol/L — AB (ref 22–32)
CREATININE: 1.19 mg/dL (ref 0.61–1.24)
Chloride: 100 mmol/L (ref 98–111)
GFR calc Af Amer: >60 mL/min (ref >60)
GFR calc non Af Amer: >60 mL/min (ref >60)
GLUCOSE: 621 mg/dL — AB (ref 70–99)
Potassium: 4.2 mmol/L (ref 3.5–5.1)
SODIUM: 135 mmol/L (ref 135–145)
TOTAL PROTEIN: 7.2 g/dL (ref 6.5–8.1)

## 2018-06-03 LAB — I-STAT TROPONIN, ED: Troponin i, poc: 0.16 ng/mL (ref 0.00–0.08)

## 2018-06-03 LAB — I-STAT ARTERIAL BLOOD GAS, ED
Acid-base deficit: 8 mmol/L — ABNORMAL HIGH (ref 0.0–2.0)
Bicarbonate: 17.8 mmol/L — ABNORMAL LOW (ref 20.0–28.0)
O2 SAT: 96 %
PCO2 ART: 36.1 mmHg (ref 32.0–48.0)
PO2 ART: 87 mmHg (ref 83.0–108.0)
Patient temperature: 98.6
TCO2: 19 mmol/L — AB (ref 22–32)
pH, Arterial: 7.302 — ABNORMAL LOW (ref 7.350–7.450)

## 2018-06-03 LAB — TROPONIN I
TROPONIN I: 3.19 ng/mL — AB (ref <0.03)
Troponin I: 0.56 ng/mL (ref <0.03)
Troponin I: 1.83 ng/mL (ref <0.03)

## 2018-06-03 LAB — BRAIN NATRIURETIC PEPTIDE: B Natriuretic Peptide: 758.3 pg/mL — ABNORMAL HIGH (ref 0.0–100.0)

## 2018-06-03 MED ORDER — INSULIN ASPART 100 UNIT/ML ~~LOC~~ SOLN
0.0000 [IU] | Freq: Three times a day (TID) | SUBCUTANEOUS | Status: DC
  Administered 2018-06-04: 3 [IU] via SUBCUTANEOUS
  Administered 2018-06-04: 7 [IU] via SUBCUTANEOUS
  Administered 2018-06-04: 5 [IU] via SUBCUTANEOUS
  Administered 2018-06-05 (×2): 2 [IU] via SUBCUTANEOUS

## 2018-06-03 MED ORDER — ACETAMINOPHEN 650 MG RE SUPP: 650.0000 mg | Freq: Four times a day (QID) | RECTAL | Status: DC | PRN

## 2018-06-03 MED ORDER — FUROSEMIDE 10 MG/ML IJ SOLN
40.0000 mg | Freq: Once | INTRAMUSCULAR | Status: AC
  Administered 2018-06-03: 40 mg via INTRAVENOUS

## 2018-06-03 MED ORDER — HEPARIN BOLUS VIA INFUSION
2000.0000 [IU] | Freq: Once | INTRAVENOUS | Status: AC
Start: 2018-06-03 — End: 2018-06-03
  Administered 2018-06-03: 2000 [IU] via INTRAVENOUS
  Filled 2018-06-03: qty 2000

## 2018-06-03 MED ORDER — FOLIC ACID 1 MG PO TABS
1.0000 mg | ORAL_TABLET | Freq: Every day | ORAL | Status: DC
  Administered 2018-06-04 – 2018-06-05 (×2): 1 mg via ORAL
  Filled 2018-06-03 (×2): qty 1

## 2018-06-03 MED ORDER — INSULIN GLARGINE 100 UNIT/ML ~~LOC~~ SOLN
10.0000 [IU] | Freq: Once | SUBCUTANEOUS | Status: AC
  Administered 2018-06-03: 10 [IU] via SUBCUTANEOUS
  Filled 2018-06-03: qty 0.1

## 2018-06-03 MED ORDER — LORAZEPAM 2 MG/ML IJ SOLN
1.0000 mg | Freq: Once | INTRAMUSCULAR | Status: AC
  Administered 2018-06-03: 1 mg via INTRAVENOUS

## 2018-06-03 MED ORDER — PNEUMOCOCCAL VAC POLYVALENT 25 MCG/0.5ML IJ INJ
0.5000 mL | INJECTION | INTRAMUSCULAR | Status: AC
Start: 2018-06-04 — End: 2018-06-04
  Administered 2018-06-04: 0.5 mL via INTRAMUSCULAR
  Filled 2018-06-03: qty 0.5

## 2018-06-03 MED ORDER — ONDANSETRON HCL 4 MG/2ML IJ SOLN
4.0000 mg | Freq: Four times a day (QID) | INTRAMUSCULAR | Status: DC | PRN
Start: 2018-06-03 — End: 2018-06-06

## 2018-06-03 MED ORDER — NITROGLYCERIN 2 % TD OINT
1.0000 [in_us] | TOPICAL_OINTMENT | Freq: Once | TRANSDERMAL | Status: AC
  Administered 2018-06-03: 1 [in_us] via TOPICAL
  Filled 2018-06-03: qty 1

## 2018-06-03 MED ORDER — POTASSIUM CHLORIDE 10 MEQ/100ML IV SOLN
10.0000 meq | INTRAVENOUS | Status: AC
  Filled 2018-06-03: qty 100

## 2018-06-03 MED ORDER — ADULT MULTIVITAMIN W/MINERALS CH
1.0000 | ORAL_TABLET | Freq: Every day | ORAL | Status: DC
  Administered 2018-06-04 – 2018-06-05 (×2): 1 via ORAL
  Filled 2018-06-03 (×2): qty 1

## 2018-06-03 MED ORDER — INSULIN ASPART 100 UNIT/ML ~~LOC~~ SOLN
0.0000 [IU] | Freq: Every day | SUBCUTANEOUS | Status: DC
  Administered 2018-06-03 – 2018-06-04 (×2): 2 [IU] via SUBCUTANEOUS
  Administered 2018-06-05: 3 [IU] via SUBCUTANEOUS

## 2018-06-03 MED ORDER — HEPARIN (PORCINE) IN NACL 100-0.45 UNIT/ML-% IJ SOLN
2150.0000 [IU]/h | INTRAMUSCULAR | Status: DC
Start: 2018-06-03 — End: 2018-06-05
  Administered 2018-06-03 (×2): 1400 [IU]/h via INTRAVENOUS
  Administered 2018-06-04: 1600 [IU]/h via INTRAVENOUS
  Administered 2018-06-04: 1950 [IU]/h via INTRAVENOUS
  Filled 2018-06-03 (×4): qty 250

## 2018-06-03 MED ORDER — SODIUM CHLORIDE 0.9 % IV SOLN
Freq: Once | INTRAVENOUS | Status: AC
  Administered 2018-06-03: 10 mL/h via INTRAVENOUS

## 2018-06-03 MED ORDER — ACETAMINOPHEN 325 MG PO TABS
650.0000 mg | ORAL_TABLET | Freq: Four times a day (QID) | ORAL | Status: DC | PRN
  Administered 2018-06-04: 650 mg via ORAL
  Filled 2018-06-03: qty 2

## 2018-06-03 MED ORDER — VITAMIN B-1 100 MG PO TABS
100.0000 mg | ORAL_TABLET | Freq: Every day | ORAL | Status: DC
  Filled 2018-06-03: qty 1

## 2018-06-03 MED ORDER — LORAZEPAM 2 MG/ML IJ SOLN
1.0000 mg | Freq: Four times a day (QID) | INTRAMUSCULAR | Status: DC | PRN
  Administered 2018-06-03 – 2018-06-05 (×4): 1 mg via INTRAVENOUS
  Filled 2018-06-03 (×4): qty 1

## 2018-06-03 MED ORDER — DEXTROSE-NACL 5-0.45 % IV SOLN: INTRAVENOUS | Status: DC

## 2018-06-03 MED ORDER — SODIUM CHLORIDE 0.9 % IV SOLN
INTRAVENOUS | Status: DC
  Administered 2018-06-03: 7.7 [IU]/h via INTRAVENOUS
  Administered 2018-06-03: 5.2 [IU]/h via INTRAVENOUS
  Administered 2018-06-03: 7.1 [IU]/h via INTRAVENOUS
  Filled 2018-06-03: qty 1

## 2018-06-03 MED ORDER — SODIUM CHLORIDE 0.9 % IV SOLN
INTRAVENOUS | Status: AC
  Administered 2018-06-03: 13:00:00 via INTRAVENOUS

## 2018-06-03 MED ORDER — SODIUM CHLORIDE 0.9 % IV SOLN
INTRAVENOUS | Status: AC
  Administered 2018-06-03: 11:00:00 via INTRAVENOUS

## 2018-06-03 MED ORDER — ADULT MULTIVITAMIN W/MINERALS CH
1.0000 | ORAL_TABLET | Freq: Every day | ORAL | Status: DC
  Filled 2018-06-03: qty 1

## 2018-06-03 MED ORDER — THIAMINE HCL 100 MG/ML IJ SOLN
100.0000 mg | Freq: Every day | INTRAMUSCULAR | Status: DC
  Administered 2018-06-03: 100 mg via INTRAVENOUS
  Filled 2018-06-03: qty 2

## 2018-06-03 MED ORDER — POLYETHYLENE GLYCOL 3350 17 G PO PACK: 17.0000 g | PACK | Freq: Every day | ORAL | Status: DC | PRN

## 2018-06-03 MED ORDER — LORAZEPAM 1 MG PO TABS: 1.0000 mg | ORAL_TABLET | Freq: Four times a day (QID) | ORAL | Status: DC | PRN

## 2018-06-03 MED ORDER — VITAMIN B-1 100 MG PO TABS
100.0000 mg | ORAL_TABLET | Freq: Every day | ORAL | Status: DC
  Administered 2018-06-04 – 2018-06-05 (×2): 100 mg via ORAL
  Filled 2018-06-03 (×2): qty 1

## 2018-06-03 MED ORDER — SODIUM CHLORIDE 0.9 % IV SOLN
INTRAVENOUS | Status: DC
  Administered 2018-06-03 – 2018-06-04 (×2): via INTRAVENOUS

## 2018-06-03 MED ORDER — FOLIC ACID 1 MG PO TABS
1.0000 mg | ORAL_TABLET | Freq: Every day | ORAL | Status: DC
  Filled 2018-06-03: qty 1

## 2018-06-03 MED ORDER — SODIUM CHLORIDE 0.9% FLUSH
3.0000 mL | Freq: Two times a day (BID) | INTRAVENOUS | Status: DC
  Administered 2018-06-03 – 2018-06-05 (×4): 3 mL via INTRAVENOUS

## 2018-06-03 MED ORDER — LORAZEPAM 2 MG/ML IJ SOLN
INTRAMUSCULAR | Status: AC
Start: 2018-06-03 — End: 2018-06-03
  Filled 2018-06-03: qty 1

## 2018-06-03 MED ORDER — ENOXAPARIN SODIUM 40 MG/0.4ML ~~LOC~~ SOLN
40.0000 mg | SUBCUTANEOUS | Status: DC
  Administered 2018-06-03: 40 mg via SUBCUTANEOUS
  Filled 2018-06-03: qty 0.4

## 2018-06-03 MED ORDER — FUROSEMIDE 10 MG/ML IJ SOLN
40.0000 mg | Freq: Once | INTRAMUSCULAR | Status: AC
  Administered 2018-06-03: 40 mg via INTRAVENOUS
  Filled 2018-06-03: qty 4

## 2018-06-03 MED ORDER — FUROSEMIDE 10 MG/ML IJ SOLN
INTRAMUSCULAR | Status: AC
  Filled 2018-06-03: qty 4

## 2018-06-03 MED ORDER — ONDANSETRON HCL 4 MG PO TABS
4.0000 mg | ORAL_TABLET | Freq: Four times a day (QID) | ORAL | Status: DC | PRN
Start: 2018-06-03 — End: 2018-06-06

## 2018-06-03 MED ORDER — POTASSIUM CHLORIDE 10 MEQ/100ML IV SOLN
10.0000 meq | INTRAVENOUS | Status: AC
  Administered 2018-06-03 (×2): 10 meq via INTRAVENOUS
  Filled 2018-06-03: qty 100

## 2018-06-03 NOTE — Progress Notes (Signed)
ANTICOAGULATION CONSULT NOTE - Initial Consult  Pharmacy Consult for heparin Indication: ACS  Allergies  Allergen Reactions  . Lisinopril Other (See Comments)    Syncope  . Codeine Rash    Patient Measurements: Height: 5\' 8"  (172.7 cm) Weight: 218 lb (98.9 kg) IBW/kg (Calculated) : 68.4 Heparin Dosing Weight: 89.5  Vital Signs: Temp: 97.6 F (36.4 C) (08/03 1545) Temp Source: Axillary (08/03 1545) BP: 133/81 (08/03 1545) Pulse Rate: 107 (08/03 1545)  Labs: Recent Labs    06/03/18 0442 06/03/18 0924 06/03/18 1229 06/03/18 1414  HGB 15.0 15.5  --   --   HCT 47.2 47.2  --   --   PLT 336 296  --   --   CREATININE 1.19 1.13 1.05  --   TROPONINI  --  0.56*  --  1.83*    Estimated Creatinine Clearance: 85.3 mL/min (by C-G formula based on SCr of 1.05 mg/dL).   Medical History: Past Medical History:  Diagnosis Date  . Diabetes mellitus without complication [facility](HCC)     Assessment: 62 yo male with pharmacy consult for heparin for ACS. Patient received enoxparin 40mg  at ~1230. Will give half bolus due to this. No anticoagulation PTA noted.     Goal of Therapy:  Heparin level 0.3-0.7 units/ml Monitor platelets by anticoagulation protocol: Yes   Plan:  D/C enoxaparin  Heparin bolus 2000 units IV x 1 Heparin drip 1400 units/hr Heparin level in 6 hours Heparin level daily Monitor for s/sx of bleeding  Daaiyah Baumert A. Jeanella CrazePierce, PharmD, BCPS Clinical Pharmacist [place] Pager: [phone] Please utilize Amion for appropriate phone number to reach the unit pharmacist [facility]    06/03/2018,5:44 PM

## 2018-06-03 NOTE — Progress Notes (Signed)
Pt ripped two IVs out of arms, pulled progressive care monitor off, and took his BIPAP off and went to the bathroom. RN and NT cleaned pt up. Respiratory came to assess pt, told the RN to put pt on 4L [place] until pt is cleaned up and placed back in bed. Insulin drip is d/ced. IV team consult placed to obtain new IV access. Will continue to monitor pt.

## 2018-06-03 NOTE — Progress Notes (Signed)
[facility] progress Note.   Reviewed patient progress.   Troponin rising.  ST depressions in lateral leads.  Discussed with Dr. Algie CofferKadakia in Cardiology who will see the patient - heparin drip started.    Noted that patient was unable to come off of bipap, extra dose of lasix ordered.   Spoke with nursing, tachycardia improved with CIWA initiation and ativan given.   AG now 13, Bicarb 21 (improved).  Will plan to give 10 units of lantus, asked nursing to turn off insulin drip in 2 hours after lantus given.  BMET this evening.  Will also start SSI.    Plan discussed with Denny Peonrin in nursing.   Debe CoderEmily Jadie Allington, MD

## 2018-06-03 NOTE — ED Triage Notes (Signed)
Pt BIB [facility] d/t respiratory distress that started this morning. Pt met EMS at door w/ c/o SOB, diaphoretic. Pt was started on CPAP. Pt was given 1/2 of douneb. Pt has hx of DM

## 2018-06-03 NOTE — ED Notes (Signed)
Dr Judd Lienelo informed of troponin results .16

## 2018-06-03 NOTE — H&P (Addendum)
History and Physical    Robert Sanchez ZOX:[id number] DOB: [birth year] DOA: 06/03/2018  PCP: Patient, No Pcp Per, Goes to [facility] urgent care when needed Patient coming from: Home   Chief Complaint: SOB  HPI: LEEVON Sanchez is a 62 y.o. male with medical history significant of DM2 who presents with acute SOB.  Robert Sanchez was in his normal state of health yesterday when he went to sleep.  He woke with acute SOB and significant difficulty catching his breath.  He called 911 and was found on his porch tripoding.  He was placed on CPAP and brought to the ED.  He was found to be hypoxic in the ED to 83%.  His breathing has improved with CPAP and a dose of lasix.  CXR showed likely pulmonary edema.  He is not aware of a diagnosis of heart failure or CAD.  He does not see doctors regularly.  He was previously seeing a PCP, but has not for some years.  He is on metformin which he gets from urgent care.  He has chronic right leg neuropathy for which he used to take gabapentin.  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  He denies any chest pain, nausea, recent illness.  He does report coughing, increased thirst and polyuria.  He reports having pneumonia about 3-4 weeks ago and taking an antibiotic, but he cannot remember which one.    ED Course: In the ED, he was found to be in mild DKA with a bicarb of 17, pH of 7.3, Glucose of 621, AG of 18.  His initial troponin was 0.16.  His BNP was 758.  He had a WBC of 17.1 and a CXR showing mild cardiomegaly and likely pulmonary edema.  He was given lasix, ativan and nitroglycerin with good improvement in his SOB.  He continues to be tachycardic into the 120s.  He has an EKG which shows likely sinus tachycardia, but difficult to assess given artifact.   Review of Systems: As per HPI otherwise 10 point review of systems negative.  He reports multiple bug bites yesterday and using calamine lotion on his legs for itching.    Past Medical History:  Diagnosis Date  .  Diabetes mellitus without complication [facility])     Past Surgical History:  Procedure Laterality Date  . SPINE SURGERY     He is a current smoker.   reports that he has been smoking.  He has been smoking about 1.00 pack per day. He has never used smokeless tobacco. He reports that he drinks alcohol. He reports that he does not use drugs.  Allergies  Allergen Reactions  . Lisinopril Other (See Comments)    Syncope  . Codeine Rash   Reviewed with patient.  Family History  Problem Relation Age of Onset  . Diabetes Mother   . Cancer Father   . Heart disease Father     Prior to Admission medications   Medication Sig Start Date End Date Taking? Authorizing Provider  metFORMIN (GLUCOPHAGE) 1000 MG tablet Take 1 tablet (1,000 mg total) by mouth 2 (two) times daily with a meal. 08/23/16  Yes Myra Rude, MD                                Physical Exam:  Constitutional: NAD, calm, comfortable Vitals:   06/03/18 0600 06/03/18 0615 06/03/18 0630 06/03/18 0825  BP: (!) 148/116 133/87 129/85  Pulse: (!) 132 (!) 125 (!) 128 (!) 120  Resp: (!) 31 (!) 31 (!) 31   SpO2: 97% 93% 97% 96%  Weight:      Height:       Eyes: lids and conjunctivae normal ENMT: Wearing CPAP mask, dry mouth, + facial hair Neck: normal, supple Respiratory: + rales in bases, worse on the right.  He has CPAP on, no wheezing Cardiovascular: Tachycardic, regular, no apparent murmur, pulses palpable in radial and DP, though 1+ in DP bilaterally.  Abdomen: + distention, but soft, +BS Musculoskeletal: no clubbing / cyanosis. Normal muscle tone.  Skin: He has multiple bug bites on legs, dried calamine lotion to legs bilaterally, no apparent new rash Neurologic: Grossly normal, moving all extremities.  Psychiatric: Normal judgment and insight. Alert and oriented x 3. Normal mood.    Labs on Admission: I have personally reviewed following labs and imaging studies  CBC: Recent Labs  Lab 06/03/18 0442  WBC  17.1*  NEUTROABS 10.6*  HGB 15.0  HCT 47.2  MCV 96.5  PLT 336   Basic Metabolic Panel: Recent Labs  Lab 06/03/18 0442  NA 135  K 4.2  CL 100  CO2 17*  GLUCOSE 621*  BUN 10  CREATININE 1.19  CALCIUM 9.0   GFR: Estimated Creatinine Robert: 75.3 mL/min (by C-G formula based on SCr of 1.19 mg/dL). Liver Function Tests: Recent Labs  Lab 06/03/18 0442  AST 93*  ALT 80*  ALKPHOS 109  BILITOT 0.8  PROT 7.2  ALBUMIN 3.6   No results for input(s): LIPASE, AMYLASE in the last 168 hours. No results for input(s): AMMONIA in the last 168 hours. Coagulation Profile: No results for input(s): INR, PROTIME in the last 168 hours. Cardiac Enzymes: No results for input(s): CKTOTAL, CKMB, CKMBINDEX, TROPONINI in the last 168 hours. BNP (last 3 results) No results for input(s): PROBNP in the last 8760 hours. HbA1C: No results for input(s): HGBA1C in the last 72 hours. CBG: Recent Labs  Lab 06/03/18 0814  GLUCAP 580*   Lipid Profile: No results for input(s): CHOL, HDL, LDLCALC, TRIG, CHOLHDL, LDLDIRECT in the last 72 hours. Thyroid Function Tests: No results for input(s): TSH, T4TOTAL, FREET4, T3FREE, THYROIDAB in the last 72 hours. Anemia Panel: No results for input(s): VITAMINB12, FOLATE, FERRITIN, TIBC, IRON, RETICCTPCT in the last 72 hours. Urine analysis: No results found for: COLORURINE, APPEARANCEUR, LABSPEC, PHURINE, GLUCOSEU, HGBUR, BILIRUBINUR, KETONESUR, PROTEINUR, UROBILINOGEN, NITRITE, LEUKOCYTESUR  Radiological Exams on Admission: Dg Chest Portable 1 View  Result Date: 06/03/2018 CLINICAL DATA:  Respiratory distress. EXAM: PORTABLE CHEST 1 VIEW COMPARISON:  None. FINDINGS: Mild cardiomegaly. Normal mediastinal contours. Significant diffuse bilateral interstitial and bronchial thickening. Probable fluid in the right minor fissure. No pneumothorax. No acute osseous abnormalities. IMPRESSION: Mild cardiomegaly with diffuse interstitial and bronchial thickening  suspicious for pulmonary edema, at least moderate in degree. Probable fluid in the right minor fissure. Electronically Signed   By: Rubye Oaks M.D.   On: 06/03/2018 05:39    EKG: Independently reviewed. Sinus tachycardia, artifact, possible block, difficult to assess.    Assessment/Plan Acute pulmonary edema  - DDx includes acute heart failure (BNP elevated), acute renal failure (BUN and Cr relatively normal), acute MI (troponin elevated and with tachycardia), acute liver failure (mild elevation in transaminases would not fit picture) - Trend troponin, check repeat EKG.  If flat, this is likely reactive to acute illness - IV lasix X 1, strict I/O, CPAP.  If unable to come off of CPAP,  consider further lasix.  He will be getting quite a bit of fluid with his treatment of DKA - TTE ordered - Repeat EKG - Repeat CMET in the AM - reports drinking beer so may be related to ETOH use    DKA, mild - AG 18, pH 7.3, Bicarb 17 - these are consistent with mild disease - Initiated insulin drip, glucommander  - Transition to long acting insulin likely today - Check A1C - Will likely need more therapy than metformin, discuss establishing with PCP - He has neuropathy at baseline, trial of gabapentin to start tonight  Sinus tachycardia with elevated troponin - Possibly reactive, related to acute DKA and possibly dehydration (though in the setting of pulmonary edema); also reported to nursing that he drinks daily, possible withdrawal.  - Strict I/O - Monitor on telemetry - Trend troponin - Repeat EKG - Fluids as per insulin drip/glucommander protocol - Monitor for worsening - He is active, no recent surgery or cancer diagnosis - Wells score for PE is 1.5 - low risk - CIWA protocol ordered.  - Check TSH  Leukocytosis - He reports recent pneumonia and antibiotic use - Check UA (does not report symptoms).  - BC for fever - CXR does not appear to show a pneumonia - Trend   DVT prophylaxis:  Lovenox Code Status: Full Disposition Plan: Admit for work up of pulmonary edema Consults called: None Admission status: Inpatient, SDU   Debe CoderEmily Akiva Brassfield MD [facility] Pager [phone]  If 7PM-7AM, please contact night-coverage [email or web address] Password [email or web address]  06/03/2018, 8:44 AM

## 2018-06-03 NOTE — ED Notes (Signed)
RT at beside.

## 2018-06-03 NOTE — ED Notes (Signed)
RT Called Pt desat to 83%

## 2018-06-03 NOTE — ED Notes (Signed)
Pt CBG is 621, per lab

## 2018-06-03 NOTE — ED Notes (Signed)
Pt's CBG result was 580, informed Audrey-RN.

## 2018-06-03 NOTE — Progress Notes (Signed)
11:50 Telephone report rec'd from HaitiAurdia, Charity fundraiserN (ED), pt. rec'd from ED at 12:00 via stretcher, Bipap in place, RT at bedside. Client admitted to progressive level of care. Placed on telemon, Insulin infusing at 4.2 units/hr, [facility], CHG bath completed, condom cath placed. Oriented client to room, call bell, and phone. Denies any pain or distress at this time. Lungs clear anteriorly, Posterior  mid-lower lobes with wheezes. Glucose stabilizer iniated, blood sugar assessed, rate increased per stabilizer, increased to 10.units/hr-verified by RN. 1 bolus 0.9NS administered, per Hamilton CapriAudria one remaining bolus needed on floor, order verified-pharmacy notified to make order active. No distress at this time. Will continue to monitor. Erle CrockerKJones RN

## 2018-06-03 NOTE — Progress Notes (Signed)
Notified pharmacy x 3 regarding potassium chloride, order not showing active for administration.

## 2018-06-03 NOTE — ED Provider Notes (Signed)
[facility] EMERGENCY DEPARTMENT Provider Note   CSN: [id number] Arrival date & time: 06/03/18  0431     History   Chief Complaint Chief Complaint  Patient presents with  . Respiratory Distress    HPI Robert Sanchez is a 62 y.o. male.  Patient is a 62 year old male with history of diabetes, arthritis.  He presents with complaints of shortness of breath.  He woke from sleep this morning with severe difficulty breathing.  911 was called.  He was found on his front porch by paramedics tripoding in severe respiratory distress.  He is placed on CPAP and transported here.  He denies any chest pain.  He denies any prior cardiac history.  He has no history of CHF.  The history is provided by the patient.    Past Medical History:  Diagnosis Date  . Diabetes mellitus without complication [facility])     Patient Active Problem List   Diagnosis Date Noted  . Sebaceous cyst 08/23/2016  . Acute pain of right knee 08/23/2016  . Hidradenitis 08/23/2016  . Right hip pain 08/14/2014  . Diabetes (HCC) 11/27/2013  . Arthritis 11/27/2013  . Neuropathy in diabetes (HCC) 11/27/2013    Past Surgical History:  Procedure Laterality Date  . SPINE SURGERY          Home Medications    Prior to Admission medications   Medication Sig Start Date End Date Taking? Authorizing Provider  acetic acid-hydrocortisone (VOSOL-HC) OTIC solution Place 4 drops into the left ear 2 (two) times daily. 08/29/17   Trena Platt D, PA  gabapentin (NEURONTIN) 100 MG capsule Take 1 capsule (100 mg total) by mouth at bedtime. Patient not taking: Reported on 08/29/2017 08/14/14   Le, Thao P, DO  lisinopril (ZESTRIL) 2.5 MG tablet Take 1 tablet (2.5 mg total) by mouth daily. Patient not taking: Reported on 08/23/2016 08/14/14   Le, Thao P, DO  metFORMIN (GLUCOPHAGE) 1000 MG tablet Take 1 tablet (1,000 mg total) by mouth 2 (two) times daily with a meal. 08/23/16   Myra Rude, MD  traMADol  (ULTRAM) 50 MG tablet Take 1 tablet (50 mg total) by mouth every 8 (eight) hours as needed. May cause constipation 08/23/16   Myra Rude, MD    Family History Family History  Problem Relation Age of Onset  . Diabetes Mother   . Cancer Father   . Heart disease Father     Social History Social History   Tobacco Use  . Smoking status: Current Every Day Smoker  . Smokeless tobacco: Never Used  Substance Use Topics  . Alcohol use: Yes  . Drug use: No     Allergies   Lisinopril   Review of Systems Review of Systems  All other systems reviewed and are negative.    Physical Exam Updated Vital Signs There were no vitals taken for this visit.  Physical Exam  Constitutional: He is oriented to person, place, and time. He appears well-developed and well-nourished. He appears distressed.  HENT:  Head: Normocephalic and atraumatic.  Mouth/Throat: Oropharynx is clear and moist.  Neck: Normal range of motion. Neck supple.  Cardiovascular: Normal rate and regular rhythm. Exam reveals no friction rub.  No murmur heard. Pulmonary/Chest: He is in respiratory distress. He has no wheezes. He has no rales.  Patient arrives in severe respiratory distress.  He is extremely anxious, tachypneic.  There are rales in the bases bilaterally.  Abdominal: Soft. Bowel sounds are normal. He exhibits no  distension. There is no tenderness.  Musculoskeletal: Normal range of motion. He exhibits no edema.  Neurological: He is alert and oriented to person, place, and time. Coordination normal.  Skin: Skin is warm. He is diaphoretic.  Nursing note and vitals reviewed.    ED Treatments / Results  Labs (all labs ordered are listed, but only abnormal results are displayed) Labs Reviewed  COMPREHENSIVE METABOLIC PANEL  BRAIN NATRIURETIC PEPTIDE  CBC WITH DIFFERENTIAL/PLATELET  BLOOD GAS, ARTERIAL  I-STAT TROPONIN, ED    ED ECG REPORT   Date: 06/03/2018  Rate: 155  Rhythm: sinus  tachycardia  QRS Axis: right  Intervals: normal  ST/T Wave abnormalities: nonspecific T wave changes  Conduction Disutrbances:none  Narrative Interpretation:   Old EKG Reviewed: none available  I have personally reviewed the EKG tracing and agree with the computerized printout as noted.   Radiology No results found.  Procedures Procedures (including critical care time)  Medications Ordered in ED Medications  furosemide (LASIX) injection 40 mg (has no administration in time range)  LORazepam (ATIVAN) 2 MG/ML injection (has no administration in time range)  furosemide (LASIX) 10 MG/ML injection (has no administration in time range)  LORazepam (ATIVAN) injection 1 mg (has no administration in time range)  nitroGLYCERIN (NITROGLYN) 2 % ointment 1 inch (has no administration in time range)     Initial Impression / Assessment and Plan / ED Course  I have reviewed the triage vital signs and the nursing notes.  Pertinent labs & imaging results that were available during my care of the patient were reviewed by me and considered in my medical decision making (see chart for details).  Patient brought by EMS being found acutely dyspneic.  He arrived here in severe respiratory distress.  He was markedly hypertensive, diaphoretic, and struggling to breathe.  His CPAP was removed and BiPAP applied with good results.  He was also given Nitropaste, Lasix, and Ativan.  This significantly improved his situation.  Work-up reveals elevated BNP, findings consistent with CHF on the chest x-ray, and electrolytes that are reflective of mild DKA.  He was started on a glucose stabilizer, however fluids were held secondary to volume overload status.  I have spoken with Dr. Toniann FailKakrakandy who agrees to admit.  CRITICAL CARE Performed by: Geoffery Lyonsouglas Klarisa Barman Total critical care time: 45 minutes Critical care time was exclusive of separately billable procedures and treating other patients. Critical care was  necessary to treat or prevent imminent or life-threatening deterioration. Critical care was time spent personally by me on the following activities: development of treatment plan with patient and/or surrogate as well as nursing, discussions with consultants, evaluation of patient's response to treatment, examination of patient, obtaining history from patient or surrogate, ordering and performing treatments and interventions, ordering and review of laboratory studies, ordering and review of radiographic studies, pulse oximetry and re-evaluation of patient's condition.   Final Clinical Impressions(s) / ED Diagnoses   Final diagnoses:  None    ED Discharge Orders    None       Geoffery Lyonselo, Montrae Braithwaite, MD 06/03/18 [phone]

## 2018-06-03 NOTE — Consult Note (Signed)
Referring Physician: Lenise Herald , MD  Robert Sanchez is an 62 y.o. male.                       Chief Complaint: Shortness of breath  HPI: 62 year old patient presented with acute onset of shortness of breath. He has PMH of hypertension and DM, II. He denied chest pain but had cough and increased thirst and polyuria. He had pneumonia 3 -4 weeks ago. His BNP was elevated at 758, Hgb A1c of 12.4, blood glucose of 621 with A gap of 18 and bicarb of 17. Chest x-ray showed cardiomegaly with pulmonary edema. His troponin I is mildly elevated from demand ischemia. EKG shows sinus tachycardia.  Past Medical History:  Diagnosis Date  . Diabetes mellitus without complication [facility])       Past Surgical History:  Procedure Laterality Date  . SPINE SURGERY      Family History  Problem Relation Age of Onset  . Diabetes Mother   . Cancer Father   . Heart disease Father    Social History:  reports that he has been smoking.  He has been smoking about 1.00 pack per day. He has never used smokeless tobacco. He reports that he drinks alcohol. He reports that he does not use drugs.  Allergies:  Allergies  Allergen Reactions  . Lisinopril Other (See Comments)    Syncope  . Codeine Rash    Medications Prior to Admission  Medication Sig Dispense Refill  . metFORMIN (GLUCOPHAGE) 1000 MG tablet Take 1 tablet (1,000 mg total) by mouth 2 (two) times daily with a meal. 180 tablet 1  . acetic acid-hydrocortisone (VOSOL-HC) OTIC solution Place 4 drops into the left ear 2 (two) times daily. (Patient not taking: Reported on 06/03/2018) 10 mL 0  . gabapentin (NEURONTIN) 100 MG capsule Take 1 capsule (100 mg total) by mouth at bedtime. (Patient not taking: Reported on 08/29/2017) 90 capsule 0  . lisinopril (ZESTRIL) 2.5 MG tablet Take 1 tablet (2.5 mg total) by mouth daily. (Patient not taking: Reported on 08/23/2016) 90 tablet 1  . traMADol (ULTRAM) 50 MG tablet Take 1 tablet (50 mg total) by mouth every 8  (eight) hours as needed. May cause constipation (Patient not taking: Reported on 06/03/2018) 30 tablet 0    Results for orders placed or performed during the hospital encounter of 06/03/18 (from the past 48 hour(s))  Comprehensive metabolic panel     Status: Abnormal   Collection Time: 06/03/18  4:42 AM  Result Value Ref Range   Sodium 135 135 - 145 mmol/L   Potassium 4.2 3.5 - 5.1 mmol/L   Chloride 100 98 - 111 mmol/L   CO2 17 (L) 22 - 32 mmol/L   Glucose, Bld 621 (HH) 70 - 99 mg/dL    Comment: CRITICAL RESULT CALLED TO, READ BACK BY AND VERIFIED WITH: DAVID B,RN 06/03/18 0537 WAYK    BUN 10 6 - 20 mg/dL   Creatinine, Ser 1.19 0.61 - 1.24 mg/dL   Calcium 9.0 8.9 - 10.3 mg/dL   Total Protein 7.2 6.5 - 8.1 g/dL   Albumin 3.6 3.5 - 5.0 g/dL   AST 93 (H) 15 - 41 U/L   ALT 80 (H) 0 - 44 U/L   Alkaline Phosphatase 109 38 - 126 U/L   Total Bilirubin 0.8 0.3 - 1.2 mg/dL   GFR calc non Af Amer >60 >60 mL/min   GFR calc Af Amer >60 >60  mL/min    Comment: (NOTE) The eGFR has been calculated using the CKD EPI equation. This calculation has not been validated in all clinical situations. eGFR's persistently <60 mL/min signify possible Chronic Kidney Disease.    Anion gap 18 (H) 5 - 15    Comment: Performed at [facility], [place] [address] [place], [place] [address]  Brain natriuretic peptide     Status: Abnormal   Collection Time: 06/03/18  4:42 AM  Result Value Ref Range   B Natriuretic Peptide 758.3 (H) 0.0 - 100.0 pg/mL    Comment: Performed at [facility] [address], [place], [place] [address]  CBC with Differential     Status: Abnormal   Collection Time: 06/03/18  4:42 AM  Result Value Ref Range   WBC 17.1 (H) 4.0 - 10.5 K/uL   RBC 4.89 4.22 - 5.81 MIL/uL   Hemoglobin 15.0 13.0 - 17.0 g/dL   HCT 47.2 39.0 - 52.0 %   MCV 96.5 78.0 - 100.0 fL   MCH 30.7 26.0 - 34.0 pg   MCHC 31.8 30.0 - 36.0 g/dL   RDW 13.1 11.5 - 15.5 %   Platelets 336 150 - 400 K/uL    Neutrophils Relative % 61 %   Neutro Abs 10.6 (H) 1.7 - 7.7 K/uL   Lymphocytes Relative 28 %   Lymphs Abs 4.8 (H) 0.7 - 4.0 K/uL   Monocytes Relative 7 %   Monocytes Absolute 1.3 (H) 0.1 - 1.0 K/uL   Eosinophils Relative 2 %   Eosinophils Absolute 0.3 0.0 - 0.7 K/uL   Basophils Relative 1 %   Basophils Absolute 0.1 0.0 - 0.1 K/uL   Immature Granulocytes 1 %   Abs Immature Granulocytes 0.1 0.0 - 0.1 K/uL    Comment: Performed at [facility], [address], [place], [place] [address]  I-stat troponin, ED     Status: Abnormal   Collection Time: 06/03/18  4:51 AM  Result Value Ref Range   Troponin i, poc 0.16 (HH) 0.00 - 0.08 ng/mL   Comment NOTIFIED PHYSICIAN    Comment 3            Comment: Due to the release kinetics of cTnI, a negative result within the first hours of the onset of symptoms does not rule out myocardial infarction with certainty. If myocardial infarction is still suspected, repeat the test at appropriate intervals.   I-Stat arterial blood gas, ED     Status: Abnormal   Collection Time: 06/03/18  5:03 AM  Result Value Ref Range   pH, Arterial 7.302 (L) 7.350 - 7.450   pCO2 arterial 36.1 32.0 - 48.0 mmHg   pO2, Arterial 87.0 83.0 - 108.0 mmHg   Bicarbonate 17.8 (L) 20.0 - 28.0 mmol/L   TCO2 19 (L) 22 - 32 mmol/L   O2 Saturation 96.0 %   Acid-base deficit 8.0 (H) 0.0 - 2.0 mmol/L   Patient temperature 98.6 F    Collection site RADIAL, ALLEN'S TEST ACCEPTABLE    Drawn by Operator    Sample type ARTERIAL   POC CBG, ED     Status: Abnormal   Collection Time: 06/03/18  8:14 AM  Result Value Ref Range   Glucose-Capillary 580 (HH) 70 - 99 mg/dL   Comment 1 Notify RN    Comment 2 Document in Chart   Basic metabolic panel     Status: Abnormal   Collection Time: 06/03/18  9:24 AM  Result Value  Ref Range   Sodium 134 (L) 135 - 145 mmol/L   Potassium 4.7 3.5 - 5.1 mmol/L   Chloride 99 98 - 111 mmol/L   CO2 22 22 - 32 mmol/L   Glucose, Bld 528 (HH) 70 - 99  mg/dL    Comment: CRITICAL RESULT CALLED TO, READ BACK BY AND VERIFIED WITH: AUDREY MCKNOWN RN AT 1055 06/03/18 BY WOOLLENK    BUN 12 6 - 20 mg/dL   Creatinine, Ser 1.13 0.61 - 1.24 mg/dL   Calcium 9.0 8.9 - 10.3 mg/dL   GFR calc non Af Amer >60 >60 mL/min   GFR calc Af Amer >60 >60 mL/min    Comment: (NOTE) The eGFR has been calculated using the CKD EPI equation. This calculation has not been validated in all clinical situations. eGFR's persistently <60 mL/min signify possible Chronic Kidney Disease.    Anion gap 13 5 - 15    Comment: Performed at [facility] [address], [place], [place] [address]  CBC     Status: Abnormal   Collection Time: 06/03/18  9:24 AM  Result Value Ref Range   WBC 13.6 (H) 4.0 - 10.5 K/uL   RBC 5.04 4.22 - 5.81 MIL/uL   Hemoglobin 15.5 13.0 - 17.0 g/dL   HCT 47.2 39.0 - 52.0 %   MCV 93.7 78.0 - 100.0 fL   MCH 30.8 26.0 - 34.0 pg   MCHC 32.8 30.0 - 36.0 g/dL   RDW 13.2 11.5 - 15.5 %   Platelets 296 150 - 400 K/uL    Comment: Performed at [facility], [place] [address] [place], [place] [address]  Troponin I (q 6hr x 3)     Status: Abnormal   Collection Time: 06/03/18  9:24 AM  Result Value Ref Range   Troponin I 0.56 (HH) <0.03 ng/mL    Comment: CRITICAL RESULT CALLED TO, READ BACK BY AND VERIFIED WITHZack Seal RN AT 1055 06/03/18 BY Norco Performed at [facility], [address], [place], [place] [address]   Hemoglobin A1c     Status: Abnormal   Collection Time: 06/03/18  9:24 AM  Result Value Ref Range   Hgb A1c MFr Bld 12.4 (H) 4.8 - 5.6 %    Comment: (NOTE) Pre diabetes:          5.7%-6.4% Diabetes:              >6.4% Glycemic control for   <7.0% adults with diabetes    Mean Plasma Glucose 309.18 mg/dL    Comment: Performed at [facility] [address], [place], [place] [address]  POC CBG, ED     Status: Abnormal   Collection Time: 06/03/18  9:46 AM  Result Value Ref Range   Glucose-Capillary  477 (H) 70 - 99 mg/dL   Comment 1 Notify RN    Comment 2 Document in Chart   Urinalysis, Routine w reflex microscopic     Status: Abnormal   Collection Time: 06/03/18  9:50 AM  Result Value Ref Range   Color, Urine STRAW (A) YELLOW   APPearance CLEAR CLEAR   Specific Gravity, Urine 1.016 1.005 - 1.030   pH 5.0 5.0 - 8.0   Glucose, UA >=500 (A) NEGATIVE mg/dL   Hgb urine dipstick NEGATIVE NEGATIVE   Bilirubin Urine NEGATIVE NEGATIVE   Ketones, ur NEGATIVE NEGATIVE mg/dL   Protein, ur NEGATIVE NEGATIVE mg/dL   Nitrite NEGATIVE NEGATIVE   Leukocytes, UA NEGATIVE  NEGATIVE   Bacteria, UA NONE SEEN NONE SEEN   Mucus PRESENT     Comment: Performed at [facility], [place] [address], [place], [place] [address]  CBG monitoring, ED     Status: Abnormal   Collection Time: 06/03/18 11:31 AM  Result Value Ref Range   Glucose-Capillary 418 (H) 70 - 99 mg/dL  Glucose, capillary     Status: Abnormal   Collection Time: 06/03/18 12:19 PM  Result Value Ref Range   Glucose-Capillary 402 (H) 70 - 99 mg/dL  Basic metabolic panel     Status: Abnormal   Collection Time: 06/03/18 12:29 PM  Result Value Ref Range   Sodium 139 135 - 145 mmol/L   Potassium 4.3 3.5 - 5.1 mmol/L   Chloride 105 98 - 111 mmol/L   CO2 21 (L) 22 - 32 mmol/L   Glucose, Bld 396 (H) 70 - 99 mg/dL   BUN 13 6 - 20 mg/dL   Creatinine, Ser 1.05 0.61 - 1.24 mg/dL   Calcium 8.9 8.9 - 10.3 mg/dL   GFR calc non Af Amer >60 >60 mL/min   GFR calc Af Amer >60 >60 mL/min    Comment: (NOTE) The eGFR has been calculated using the CKD EPI equation. This calculation has not been validated in all clinical situations. eGFR's persistently <60 mL/min signify possible Chronic Kidney Disease.    Anion gap 13 5 - 15    Comment: Performed at [facility] [address], [place], [place] [address]  Glucose, capillary     Status: Abnormal   Collection Time: 06/03/18  1:30 PM  Result Value Ref Range   Glucose-Capillary 296 (H) 70 - 99  mg/dL  Troponin I (q 6hr x 3)     Status: Abnormal   Collection Time: 06/03/18  2:14 PM  Result Value Ref Range   Troponin I 1.83 (HH) <0.03 ng/mL    Comment: CRITICAL VALUE NOTED.  VALUE IS CONSISTENT WITH PREVIOUSLY REPORTED AND CALLED VALUE. Performed at [facility], [place] [address], [place], [place] [address]   TSH     Status: None   Collection Time: 06/03/18  2:14 PM  Result Value Ref Range   TSH 0.733 0.350 - 4.500 uIU/mL    Comment: Performed by a 3rd Generation assay with a functional sensitivity of <=0.01 uIU/mL. Performed at [facility], [place] [address] [place], [place] [address]   Glucose, capillary     Status: Abnormal   Collection Time: 06/03/18  2:36 PM  Result Value Ref Range   Glucose-Capillary 252 (H) 70 - 99 mg/dL  Glucose, capillary     Status: Abnormal   Collection Time: 06/03/18  3:44 PM  Result Value Ref Range   Glucose-Capillary 258 (H) 70 - 99 mg/dL  Glucose, capillary     Status: Abnormal   Collection Time: 06/03/18  4:50 PM  Result Value Ref Range   Glucose-Capillary 141 (H) 70 - 99 mg/dL   Dg Chest Portable 1 View  Result Date: 06/03/2018 CLINICAL DATA:  Respiratory distress. EXAM: PORTABLE CHEST 1 VIEW COMPARISON:  None. FINDINGS: Mild cardiomegaly. Normal mediastinal contours. Significant diffuse bilateral interstitial and bronchial thickening. Probable fluid in the right minor fissure. No pneumothorax. No acute osseous abnormalities. IMPRESSION: Mild cardiomegaly with diffuse interstitial and bronchial thickening suspicious for pulmonary edema, at least moderate in degree. Probable fluid in the right minor fissure. Electronically Signed   By: Jeb Levering M.D.   On: 06/03/2018 05:39  Review Of Systems- unable to obtain at this time due to sedation and CPAP mask Constitutional: No fever, chills, weight loss or gain. Eyes: No vision change, wears glasses. No discharge or pain. Ears: No hearing loss, No tinnitus. Respiratory: No  asthma, COPD, pneumonias. No shortness of breath. No hemoptysis. Cardiovascular: No chest pain, palpitation, leg edema. Gastrointestinal: No nausea, vomiting, diarrhea, constipation. No GI bleed. No hepatitis. Genitourinary: No dysuria, hematuria, kidney stone. No incontinance. Neurological: No headache, stroke, seizures.  Psychiatry: No psych facility admission for anxiety, depression, suicide. No detox. Skin: No rash. Musculoskeletal: No joint pain, fibromyalgia. No neck pain, back pain. Lymphadenopathy: No lymphadenopathy. Hematology: No anemia or easy bruising.   Blood pressure 133/81, pulse (!) 107, temperature 97.6 F (36.4 C), temperature source Axillary, resp. rate (!) 21, height 5' 8" (1.727 m), weight 98.9 kg (218 lb), SpO2 100 %. Body mass index is 33.15 kg/m. General appearance: Sleepy, cooperative, appears stated age and moderate respiratory distress Head: Normocephalic, atraumatic. Eyes: Blue eyes, pink conjunctiva, corneas clear. PERRL, EOM's intact. Neck: No adenopathy, no carotid bruit, no JVD, supple, symmetrical, trachea midline and thyroid not enlarged. Resp: Crackles to auscultation bilaterally. Cardio: Tachycardic, Regular rate and rhythm, S1, S2 normal, II/VI systolic murmur, no click, rub or gallop GI: Soft, non-tender; bowel sounds normal; no organomegaly. Extremities: No edema, cyanosis or clubbing. Skin: Warm and dry.  Neurologic: Alert and oriented X 3, normal strength.  Assessment/Plan Acute coronary syndrome Acute pulmonary edema Possible diastolic left heart failure, acute DKA, mild Obesity Hypertension  Agree with IV lasix, IV heparin use and troponin I cycling. Echocardiogram for LV function. Continue CPAP till pulmonary edema is decreased. Will need cardiac interventions post stabilization in absence of chest pain and EKG changes of STEMI.   Birdie Riddle, MD  06/03/2018, 5:53 PM

## 2018-06-03 NOTE — Progress Notes (Addendum)
RT called regarding order for patient on BIPAP and not CPAP as per MD order. RT was given report by ED RT that patient was tried off BIPAP in ED but was unable to come off due to desats and increases WOB. RT tried patient off BIPAP. Patient with in 2 minutes dropped is sats to 87-88% and had increased WOB and stated he was not breathing well. RT placed patient back on BIPAP previous setting and decreased his FIO2 to 50%. RT placed a BIPAP order as per RCP protocol. RN aware and notified MD. Vital signs stable at this time.

## 2018-06-04 ENCOUNTER — Inpatient Hospital Stay (HOSPITAL_COMMUNITY): Payer: Self-pay

## 2018-06-04 LAB — BASIC METABOLIC PANEL
ANION GAP: 11 (ref 5–15)
BUN: 11 mg/dL (ref 6–20)
CHLORIDE: 104 mmol/L (ref 98–111)
CO2: 25 mmol/L (ref 22–32)
Calcium: 8.4 mg/dL — ABNORMAL LOW (ref 8.9–10.3)
Creatinine, Ser: 0.99 mg/dL (ref 0.61–1.24)
GFR calc Af Amer: >60 mL/min (ref >60)
GLUCOSE: 238 mg/dL — AB (ref 70–99)
POTASSIUM: 4 mmol/L (ref 3.5–5.1)
SODIUM: 140 mmol/L (ref 135–145)

## 2018-06-04 LAB — PROCALCITONIN: PROCALCITONIN: 0.55 ng/mL

## 2018-06-04 LAB — CBC
HCT: 41.4 % (ref 39.0–52.0)
HEMATOCRIT: 41.8 % (ref 39.0–52.0)
HEMOGLOBIN: 13.2 g/dL (ref 13.0–17.0)
HEMOGLOBIN: 13.6 g/dL (ref 13.0–17.0)
MCH: 30.5 pg (ref 26.0–34.0)
MCH: 30.7 pg (ref 26.0–34.0)
MCHC: 31.9 g/dL (ref 30.0–36.0)
MCHC: 32.5 g/dL (ref 30.0–36.0)
MCV: 95.6 fL (ref 78.0–100.0)
MCV: 94.4 fL (ref 78.0–100.0)
PLATELETS: 226 10*3/uL (ref 150–400)
Platelets: 246 10*3/uL (ref 150–400)
RBC: 4.33 MIL/uL (ref 4.22–5.81)
RBC: 4.43 MIL/uL (ref 4.22–5.81)
RDW: 13.2 % (ref 11.5–15.5)
RDW: 13.3 % (ref 11.5–15.5)
WBC: 13.9 10*3/uL — AB (ref 4.0–10.5)
WBC: 16.8 10*3/uL — ABNORMAL HIGH (ref 4.0–10.5)

## 2018-06-04 LAB — HEPARIN LEVEL (UNFRACTIONATED)
Heparin Unfractionated: 0.13 IU/mL — ABNORMAL LOW (ref 0.30–0.70)
Heparin Unfractionated: 0.21 IU/mL — ABNORMAL LOW (ref 0.30–0.70)
Heparin Unfractionated: 0.26 IU/mL — ABNORMAL LOW (ref 0.30–0.70)

## 2018-06-04 LAB — GLUCOSE, CAPILLARY
GLUCOSE-CAPILLARY: 237 mg/dL — AB (ref 70–99)
GLUCOSE-CAPILLARY: 271 mg/dL — AB (ref 70–99)
GLUCOSE-CAPILLARY: 301 mg/dL — AB (ref 70–99)
Glucose-Capillary: 211 mg/dL — ABNORMAL HIGH (ref 70–99)

## 2018-06-04 LAB — LACTIC ACID, PLASMA: Lactic Acid, Venous: 1.7 mmol/L (ref 0.5–1.9)

## 2018-06-04 LAB — HIV ANTIBODY (ROUTINE TESTING W REFLEX): HIV SCREEN 4TH GENERATION: NONREACTIVE

## 2018-06-04 MED ORDER — ASPIRIN 81 MG PO CHEW
81.0000 mg | CHEWABLE_TABLET | Freq: Every day | ORAL | Status: DC
  Administered 2018-06-04 – 2018-06-05 (×2): 81 mg via ORAL
  Filled 2018-06-04 (×2): qty 1

## 2018-06-04 MED ORDER — INSULIN GLARGINE 100 UNIT/ML ~~LOC~~ SOLN
20.0000 [IU] | Freq: Every day | SUBCUTANEOUS | Status: DC
  Administered 2018-06-04: 20 [IU] via SUBCUTANEOUS
  Filled 2018-06-04: qty 0.2

## 2018-06-04 MED ORDER — INSULIN ASPART 100 UNIT/ML ~~LOC~~ SOLN
8.0000 [IU] | Freq: Three times a day (TID) | SUBCUTANEOUS | Status: DC
  Administered 2018-06-04: 8 [IU] via SUBCUTANEOUS

## 2018-06-04 MED ORDER — HEPARIN BOLUS VIA INFUSION
2000.0000 [IU] | Freq: Once | INTRAVENOUS | Status: AC
  Administered 2018-06-04: 2000 [IU] via INTRAVENOUS
  Filled 2018-06-04: qty 2000

## 2018-06-04 MED ORDER — FAMOTIDINE IN NACL 20-0.9 MG/50ML-% IV SOLN
20.0000 mg | Freq: Two times a day (BID) | INTRAVENOUS | Status: DC
  Administered 2018-06-04 – 2018-06-05 (×2): 20 mg via INTRAVENOUS
  Filled 2018-06-04 (×3): qty 50

## 2018-06-04 MED ORDER — DIPHENHYDRAMINE HCL 50 MG/ML IJ SOLN
INTRAMUSCULAR | Status: AC
  Administered 2018-06-04: 25 mg via INTRAVENOUS
  Filled 2018-06-04: qty 1

## 2018-06-04 MED ORDER — DIPHENHYDRAMINE HCL 50 MG/ML IJ SOLN
25.0000 mg | Freq: Four times a day (QID) | INTRAMUSCULAR | Status: DC | PRN
  Administered 2018-06-04: 25 mg via INTRAVENOUS

## 2018-06-04 MED ORDER — HEPARIN BOLUS VIA INFUSION
1250.0000 [IU] | Freq: Once | INTRAVENOUS | Status: AC
  Administered 2018-06-04: 1250 [IU] via INTRAVENOUS
  Filled 2018-06-04: qty 1250

## 2018-06-04 MED ORDER — METOPROLOL TARTRATE 25 MG PO TABS
25.0000 mg | ORAL_TABLET | Freq: Two times a day (BID) | ORAL | Status: DC
  Administered 2018-06-04 – 2018-06-05 (×3): 25 mg via ORAL
  Filled 2018-06-04 (×4): qty 1

## 2018-06-04 MED ORDER — FUROSEMIDE 10 MG/ML IJ SOLN
40.0000 mg | Freq: Two times a day (BID) | INTRAMUSCULAR | Status: DC
  Administered 2018-06-04 – 2018-06-05 (×4): 40 mg via INTRAVENOUS
  Filled 2018-06-04 (×4): qty 4

## 2018-06-04 MED ORDER — LABETALOL HCL 5 MG/ML IV SOLN
5.0000 mg | INTRAVENOUS | Status: DC | PRN
  Filled 2018-06-04: qty 4

## 2018-06-04 MED ORDER — DIGOXIN 0.25 MG/ML IJ SOLN
0.2500 mg | Freq: Every day | INTRAMUSCULAR | Status: AC
  Administered 2018-06-04 – 2018-06-05 (×2): 0.25 mg via INTRAVENOUS
  Filled 2018-06-04 (×2): qty 2

## 2018-06-04 NOTE — Progress Notes (Signed)
MD notified that patient has a new temp of 101.1 orally, soft BP, and a new rash on the upper back and chest.  Patient also has periorbital edema.  The MD ordered benadryl, pepcid, and labs. After administration of medications, the patient became more relaxed, bipap was placed, O2 saturations stabilized, and temperature came down.  Will continue to monitor patient closely.

## 2018-06-04 NOTE — Progress Notes (Signed)
ANTICOAGULATION CONSULT NOTE   Pharmacy Consult for heparin Indication: ACS  Allergies  Allergen Reactions  . Lisinopril Other (See Comments)    Syncope  . Codeine Rash    Patient Measurements: Height: 5\' 8"  (172.7 cm) Weight: 216 lb 0.8 oz (98 kg) IBW/kg (Calculated) : 68.4 Heparin Dosing Weight: 89.5  Vital Signs: Temp: 98.6 F (37 C) (08/04 1459) Temp Source: Oral (08/04 1459) BP: 135/77 (08/04 1459) Pulse Rate: 119 (08/04 1459)  Labs: Recent Labs    06/03/18 0924 06/03/18 1229 06/03/18 1414 06/03/18 2106 06/04/18 0009 06/04/18 0830 06/04/18 1620  HGB 15.5  --   --   --  13.6 13.2  --   HCT 47.2  --   --   --  41.8 41.4  --   PLT 296  --   --   --  246 226  --   HEPARINUNFRC  --   --   --   --  0.21* 0.26* 0.13*  CREATININE 1.13 1.05  --   --   --  0.99  --   TROPONINI 0.56*  --  1.83* 3.19*  --   --   --     Estimated Creatinine Clearance: 90 mL/min (by C-G formula based on SCr of 0.99 mg/dL).  Assessment: 62 y/o male admitted for SOB and started on IV heparin for possible ACS. Pharmacy consulted to manage heparin drip.  Heparin level is below goal at 0.13 on 1750 units/hr. No problems with heparin infusion per RN. No bleeding noted, CBC is stable. Troponin up to 3.19.   Goal of Therapy:  Heparin level 0.3-0.7 units/ml Monitor platelets by anticoagulation protocol: Yes   Plan:  Bolus heparin 1250 units x 1 Increase heparin drip to 1950 units/hr 6 hr heparin level Daily heparin level and CBC Monitor for s/sx of bleeding    Niccolo Burggraf A. Jeanella CrazePierce, PharmD, BCPS Clinical Pharmacist [place] Pager: [phone] Please utilize Amion for appropriate phone number to reach the unit pharmacist [facility]   06/04/2018 5:34 PM

## 2018-06-04 NOTE — Progress Notes (Signed)
ANTICOAGULATION CONSULT NOTE  Pharmacy Consult for heparin Indication: ACS  Allergies  Allergen Reactions  . Lisinopril Other (See Comments)    Syncope  . Codeine Rash    Patient Measurements: Height: 5\' 8"  (172.7 cm) Weight: 218 lb (98.9 kg) IBW/kg (Calculated) : 68.4 Heparin Dosing Weight: 89.5  Vital Signs: Temp: 98.5 F (36.9 C) (08/03 2322) Temp Source: Axillary (08/03 2322) BP: 115/77 (08/03 1946) Pulse Rate: 95 (08/03 2306)  Labs: Recent Labs    06/03/18 0442 06/03/18 0924 06/03/18 1229 06/03/18 1414 06/03/18 2106 06/04/18 0009  HGB 15.0 15.5  --   --   --  13.6  HCT 47.2 47.2  --   --   --  41.8  PLT 336 296  --   --   --  246  HEPARINUNFRC  --   --   --   --   --  0.21*  CREATININE 1.19 1.13 1.05  --   --   --   TROPONINI  --  0.56*  --  1.83* 3.19*  --     Estimated Creatinine Clearance: 85.3 mL/min (by C-G formula based on SCr of 1.05 mg/dL).  Assessment: 62 y.o. male with SOB and elevated cardiac markers for heparin   Goal of Therapy:  Heparin level 0.3-0.7 units/ml Monitor platelets by anticoagulation protocol: Yes   Plan:  Heparin 2000 units IV bolus, then increase heparin 1600 units/hr Check heparin level in 6 hours.   Geannie RisenGreg Chase Arnall, PharmD, BCPS  06/04/2018,1:02 AM

## 2018-06-04 NOTE — Progress Notes (Signed)
PROGRESS NOTE    Robert Sanchez  BJY:[id number] DOB: 22-Aug-1956 DOA: 06/03/2018 PCP: Patient, No Pcp Per    Brief Narrative:  62 y.o. male with medical history significant of DM2 who presents with acute SOB.  Robert Sanchez was in his normal state of health yesterday when he went to sleep.  He woke with acute SOB and significant difficulty catching his breath.  He called 911 and was found on his porch tripoding.  He was placed on CPAP and brought to the ED.  He was found to be hypoxic in the ED to 83%.  His breathing has improved with CPAP and a dose of lasix.  CXR showed likely pulmonary edema.  He is not aware of a diagnosis of heart failure or CAD.  He does not see doctors regularly.  He was previously seeing a PCP, but has not for some years.  He is on metformin which he gets from urgent care.  He has chronic right leg neuropathy for which he used to take gabapentin.  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  He denies any chest pain, nausea, recent illness.  He does report coughing, increased thirst and polyuria.  He reports having pneumonia about 3-4 weeks ago and taking an antibiotic, but he cannot remember which one.    ED Course: In the ED, he was found to be in mild DKA with a bicarb of 17, pH of 7.3, Glucose of 621, AG of 18.  His initial troponin was 0.16.  His BNP was 758.  He had a WBC of 17.1 and a CXR showing mild cardiomegaly and likely pulmonary edema.  He was given lasix, ativan and nitroglycerin with good improvement in his SOB.   Assessment & Plan:   Active Problems:   Acute pulmonary edema (HCC)   DKA, type 2 (HCC)  Acute pulmonary edema  - Suspected acute CHF exacerbation -2d echo ordered, pending - Troponin trending up, see below - Cardiology following - Will continue BID IV lasix - Initiallly required bipap, now weaned to [facility] - Continue to diurese as tolerated -Repeat bmet in AM    DKA, mild - Presenting AG 18, pH 7.3, Bicarb 17 -Improved with insulin  gtt, since weaned to off - Glucose trends reviewed. Glucose remains in the mid-200's after 10 units lantus overnight -Will increase to 20 units lantus with 8 units meal coverage. Anticipate would require more insulin to achieve euglycemia - Hgb a1c of 12.4 noted. Will benefit from insulin at time of discharge. Patient apprehensive about insulin, primarily regarding cost - Started gabapentin for neuropathy - Check bmet in AM  Sinus tachycardia with elevated troponin - TSH within normal limits, reviewed - Will start metoprolol 25mg  po bid - Dig ordered per Cardiology  Leukocytosis - He reports recent pneumonia and antibiotic use - UA unremarkable and CXR unremarkable, both reviewed -Will repeat CBC in AM   DVT prophylaxis: heparin gtt Code Status: Full Family Communication: Pt in room,family not at bedside Disposition Plan: Uncertain at this time  Consultants:   Cardiology  Procedures:     Antimicrobials: Anti-infectives (From admission, onward)   None       Subjective: Eager to go home  Objective: Vitals:   06/04/18 1200 06/04/18 1205 06/04/18 1256 06/04/18 1459  BP:   123/79 135/77  Pulse: (!) 106  (!) 107 (!) 119  Resp:   (!) 29 (!) 26  Temp:  98.5 F (36.9 C) 98 F (36.7 C) 98.6  F (37 C)  TempSrc:  Oral Oral Oral  SpO2:   (!) 86% 90%  Weight:      Height:        Intake/Output Summary (Last 24 hours) at 06/04/2018 1609 Last data filed at 06/04/2018 1541 Gross per 24 hour  Intake 467.79 ml  Output 3300 ml  Net -2832.21 ml   Filed Weights   06/03/18 0500 06/04/18 0500  Weight: 98.9 kg (218 lb) 98 kg (216 lb 0.8 oz)    Examination:  General exam: Appears calm and comfortable  Respiratory system: Clear to auscultation. Respiratory effort normal. Cardiovascular system: tachycardic, s1, s2 Gastrointestinal system: Abdomen is nondistended, soft and nontender. No organomegaly or masses felt. Normal bowel sounds heard. Central nervous system: Alert  and oriented. No focal neurological deficits. Extremities: Symmetric 5 x 5 power. Skin: No rashes, lesions  Psychiatry: Judgement and insight appear normal. Mood & affect appropriate.   Data Reviewed: I have personally reviewed following labs and imaging studies  CBC: Recent Labs  Lab 06/03/18 0442 06/03/18 0924 06/04/18 0009 06/04/18 0830  WBC 17.1* 13.6* 16.8* 13.9*  NEUTROABS 10.6*  --   --   --   HGB 15.0 15.5 13.6 13.2  HCT 47.2 47.2 41.8 41.4  MCV 96.5 93.7 94.4 95.6  PLT 336 296 246 226   Basic Metabolic Panel: Recent Labs  Lab 06/03/18 0442 06/03/18 0924 06/03/18 1229 06/04/18 0830  NA 135 134* 139 140  K 4.2 4.7 4.3 4.0  CL 100 99 105 104  CO2 17* 22 21* 25  GLUCOSE 621* 528* 396* 238*  BUN 10 12 13 11   CREATININE 1.19 1.13 1.05 0.99  CALCIUM 9.0 9.0 8.9 8.4*   GFR: Estimated Creatinine Clearance: 90 mL/min (by C-G formula based on SCr of 0.99 mg/dL). Liver Function Tests: Recent Labs  Lab 06/03/18 0442  AST 93*  ALT 80*  ALKPHOS 109  BILITOT 0.8  PROT 7.2  ALBUMIN 3.6   No results for input(s): LIPASE, AMYLASE in the last 168 hours. No results for input(s): AMMONIA in the last 168 hours. Coagulation Profile: No results for input(s): INR, PROTIME in the last 168 hours. Cardiac Enzymes: Recent Labs  Lab 06/03/18 0924 06/03/18 1414 06/03/18 2106  TROPONINI 0.56* 1.83* 3.19*   BNP (last 3 results) No results for input(s): PROBNP in the last 8760 hours. HbA1C: Recent Labs    06/03/18 0924  HGBA1C 12.4*   CBG: Recent Labs  Lab 06/03/18 1908 06/03/18 2021 06/03/18 2223 06/04/18 0752 06/04/18 1204  GLUCAP 116* 173* 201* 237* 271*   Lipid Profile: No results for input(s): CHOL, HDL, LDLCALC, TRIG, CHOLHDL, LDLDIRECT in the last 72 hours. Thyroid Function Tests: Recent Labs    06/03/18 1414  TSH 0.733   Anemia Panel: No results for input(s): VITAMINB12, FOLATE, FERRITIN, TIBC, IRON, RETICCTPCT in the last 72 hours. Sepsis  Labs: No results for input(s): PROCALCITON, LATICACIDVEN in the last 168 hours.  No results found for this or any previous visit (from the past 240 hour(s)).   Radiology Studies: Dg Chest Portable 1 View  Result Date: 06/03/2018 CLINICAL DATA:  Respiratory distress. EXAM: PORTABLE CHEST 1 VIEW COMPARISON:  None. FINDINGS: Mild cardiomegaly. Normal mediastinal contours. Significant diffuse bilateral interstitial and bronchial thickening. Probable fluid in the right minor fissure. No pneumothorax. No acute osseous abnormalities. IMPRESSION: Mild cardiomegaly with diffuse interstitial and bronchial thickening suspicious for pulmonary edema, at least moderate in degree. Probable fluid in the right minor fissure. Electronically Signed  By: Rubye Oaks M.D.   On: 06/03/2018 05:39   Dg Chest Port 1v Same Day  Result Date: 06/04/2018 CLINICAL DATA:  CHF EXAM: PORTABLE CHEST 1 VIEW COMPARISON:  06/03/2018 FINDINGS: Cardiac shadow is stable. Diffuse changes of CHF are again identified and relatively stable given some variation in the technical factors of the image. No new focal abnormality is seen. IMPRESSION: Stable CHF. Electronically Signed   By: Alcide Clever M.D.   On: 06/04/2018 15:36    Scheduled Meds: . aspirin  81 mg Oral Daily  . digoxin  0.25 mg Intravenous Daily  . folic acid  1 mg Oral Daily  . furosemide  40 mg Intravenous BID  . insulin aspart  0-5 Units Subcutaneous QHS  . insulin aspart  0-9 Units Subcutaneous TID WC  . insulin aspart  8 Units Subcutaneous TID WC  . insulin glargine  20 Units Subcutaneous QHS  . multivitamin with minerals  1 tablet Oral Daily  . sodium chloride flush  3 mL Intravenous Q12H  . thiamine  100 mg Oral Daily   Or  . thiamine  100 mg Intravenous Daily   Continuous Infusions: . sodium chloride 25 mL/hr at 06/03/18 1804  . dextrose 5 % and 0.45% NaCl    . heparin 1,750 Units/hr (06/04/18 1610)     LOS: 1 day   Rickey Barbara, MD [facility] Pager [phone]  If 7PM-7AM, please contact night-coverage [email or web address] Password [email or web address] 06/04/2018, 4:09 PM

## 2018-06-04 NOTE — Consult Note (Signed)
Ref: Patient, No Pcp Per   Subjective:  Awake and afebrile. Now off CPAP. Understood need to stay in hospital and undergo cardiac interventions.   Objective:  Vital Signs in the last 24 hours: Temp:  [97.4 F (36.3 C)-98.5 F (36.9 C)] 97.7 F (36.5 C) (08/04 0752) Pulse Rate:  [87-111] 101 (08/04 0752) Cardiac Rhythm: Normal sinus rhythm (08/04 0700) Resp:  [21-34] 23 (08/04 0752) BP: (96-149)/(70-103) 96/74 (08/04 0850) SpO2:  [93 %-100 %] 93 % (08/04 0752) Weight:  [98 kg (216 lb 0.8 oz)] 98 kg (216 lb 0.8 oz) (08/04 0500)  Physical Exam: BP Readings from Last 1 Encounters:  06/04/18 96/74     Wt Readings from Last 1 Encounters:  06/04/18 98 kg (216 lb 0.8 oz)    Weight change: -0.884 kg (-1 lb 15.2 oz) Body mass index is 32.85 kg/m. HEENT: [place]/AT, Eyes-Blue, Conjunctiva-Pink, Sclera-Non-icteric Neck: No JVD, No bruit, Trachea midline. Lungs:  Clear, Bilateral. Cardiac:  Regular rhythm, normal S1 and S2, no S3. II/VI systolic murmur. Abdomen:  Soft, non-tender. BS present. Extremities:  No edema present. No cyanosis. No clubbing. CNS: AxOx3, Cranial nerves grossly intact, moves all 4 extremities.  Skin: Warm and dry.   Intake/Output from previous day: 08/03 0701 - 08/04 0700 In: 2467.8 [I.V.:2467.8] Out: 2700 [Urine:2700]    Lab Results: BMET    Component Value Date/Time   NA 140 06/04/2018 0830   NA 139 06/03/2018 1229   NA 134 (L) 06/03/2018 0924   K 4.0 06/04/2018 0830   K 4.3 06/03/2018 1229   K 4.7 06/03/2018 0924   CL 104 06/04/2018 0830   CL 105 06/03/2018 1229   CL 99 06/03/2018 0924   CO2 25 06/04/2018 0830   CO2 21 (L) 06/03/2018 1229   CO2 22 06/03/2018 0924   GLUCOSE 238 (H) 06/04/2018 0830   GLUCOSE 396 (H) 06/03/2018 1229   GLUCOSE 528 (HH) 06/03/2018 0924   BUN 11 06/04/2018 0830   BUN 13 06/03/2018 1229   BUN 12 06/03/2018 0924   CREATININE 0.99 06/04/2018 0830   CREATININE 1.05 06/03/2018 1229   CREATININE 1.13 06/03/2018 0924   CREATININE 0.81 08/14/2014 1126   CREATININE 0.82 11/26/2013 1210   CALCIUM 8.4 (L) 06/04/2018 0830   CALCIUM 8.9 06/03/2018 1229   CALCIUM 9.0 06/03/2018 0924   GFRNONAA >60 06/04/2018 0830   GFRNONAA >60 06/03/2018 1229   GFRNONAA >60 06/03/2018 0924   GFRNONAA >89 08/14/2014 1126   GFRAA >60 06/04/2018 0830   GFRAA >60 06/03/2018 1229   GFRAA >60 06/03/2018 0924   GFRAA >89 08/14/2014 1126   CBC    Component Value Date/Time   WBC 13.9 (H) 06/04/2018 0830   RBC 4.33 06/04/2018 0830   HGB 13.2 06/04/2018 0830   HCT 41.4 06/04/2018 0830   PLT 226 06/04/2018 0830   MCV 95.6 06/04/2018 0830   MCH 30.5 06/04/2018 0830   MCHC 31.9 06/04/2018 0830   RDW 13.3 06/04/2018 0830   LYMPHSABS 4.8 (H) 06/03/2018 0442   MONOABS 1.3 (H) 06/03/2018 0442   EOSABS 0.3 06/03/2018 0442   BASOSABS 0.1 06/03/2018 0442   HEPATIC Function Panel Recent Labs    06/03/18 0442  PROT 7.2   HEMOGLOBIN A1C No components found for: HGA1C,  MPG CARDIAC ENZYMES Lab Results  Component Value Date   TROPONINI 3.19 (HH) 06/03/2018   TROPONINI 1.83 (HH) 06/03/2018   TROPONINI 0.56 (HH) 06/03/2018   BNP No results for input(s): PROBNP in the last 8760  hours. TSH Recent Labs    06/03/18 1414  TSH 0.733   CHOLESTEROL No results for input(s): CHOL in the last 8760 hours.  Scheduled Meds: . aspirin  81 mg Oral Daily  . folic acid  1 mg Oral Daily  . furosemide  40 mg Intravenous BID  . insulin aspart  0-5 Units Subcutaneous QHS  . insulin aspart  0-9 Units Subcutaneous TID WC  . multivitamin with minerals  1 tablet Oral Daily  . pneumococcal 23 valent vaccine  0.5 mL Intramuscular Tomorrow-1000  . sodium chloride flush  3 mL Intravenous Q12H  . thiamine  100 mg Oral Daily   Or  . thiamine  100 mg Intravenous Daily   Continuous Infusions: . sodium chloride 25 mL/hr at 06/03/18 1804  . dextrose 5 % and 0.45% NaCl    . heparin 1,750 Units/hr (06/04/18 0938)   PRN Meds:.acetaminophen  **OR** acetaminophen, LORazepam **OR** LORazepam, ondansetron **OR** ondansetron (ZOFRAN) IV, polyethylene glycol  Assessment/Plan: Acute coronary syndrome Acute pulmonary edema Possible acute diastolic left heart failure Mild DKA, improving Obesity Hypertension  Awaiting echocardiogram C.cath in AM.   LOS: 1 day    Orpah Cobb  MD  06/04/2018, 11:21 AM

## 2018-06-04 NOTE — Procedures (Signed)
Heart rate too high for echo at 1:50pm, 3:30pm and 4:15pm.  Hold off on echo until HR is lower per Dr. Algie CofferKadakia.

## 2018-06-04 NOTE — Progress Notes (Signed)
ANTICOAGULATION CONSULT NOTE   Pharmacy Consult for heparin Indication: ACS  Allergies  Allergen Reactions  . Lisinopril Other (See Comments)    Syncope  . Codeine Rash    Patient Measurements: Height: 5\' 8"  (172.7 cm) Weight: 216 lb 0.8 oz (98 kg) IBW/kg (Calculated) : 68.4 Heparin Dosing Weight: 89.5  Vital Signs: Temp: 97.7 F (36.5 C) (08/04 0752) Temp Source: Oral (08/04 0752) BP: 96/74 (08/04 0850) Pulse Rate: 101 (08/04 0752)  Labs: Recent Labs    06/03/18 0924 06/03/18 1229 06/03/18 1414 06/03/18 2106 06/04/18 0009 06/04/18 0830  HGB 15.5  --   --   --  13.6 13.2  HCT 47.2  --   --   --  41.8 41.4  PLT 296  --   --   --  246 226  HEPARINUNFRC  --   --   --   --  0.21* 0.26*  CREATININE 1.13 1.05  --   --   --  0.99  TROPONINI 0.56*  --  1.83* 3.19*  --   --     Estimated Creatinine Clearance: 90 mL/min (by C-G formula based on SCr of 0.99 mg/dL).  Assessment: 62 y/o male admitted for SOB and started on IV heparin for possible ACS. Pharmacy consulted to manage heparin drip.  Heparin level is below goal at 0.26 on 1600 units/hr. Pulled out lines last night but appears heparin infusing without problems - verified with RN. No bleeding noted, CBC is stable. Troponin up to 3.19.   Goal of Therapy:  Heparin level 0.3-0.7 units/ml Monitor platelets by anticoagulation protocol: Yes   Plan:  Increase heparin drip to 1750 units/hr 6 hr heparin level Daily heparin level and CBC Monitor for s/sx of bleeding Consider adding aspirin for possible ACS    Loura BackJennifer Akeley, PharmD, BCPS Clinical Pharmacist Clinical phone for 06/04/2018 until 3p is [phone] Please check AMION for all Pharmacist numbers by unit 06/04/2018 9:30 AM

## 2018-06-04 NOTE — Progress Notes (Signed)
RT note: patient taken off of bipap and placed on 2L nasal cannula, currently tolerating well.  Will continue to monitor.

## 2018-06-04 NOTE — H&P (View-Only) (Signed)
Ref: Patient, No Pcp Per   Subjective:  Awake and afebrile. Now off CPAP. Understood need to stay in hospital and undergo cardiac interventions.   Objective:  Vital Signs in the last 24 hours: Temp:  [97.4 F (36.3 C)-98.5 F (36.9 C)] 97.7 F (36.5 C) (08/04 0752) Pulse Rate:  [87-111] 101 (08/04 0752) Cardiac Rhythm: Normal sinus rhythm (08/04 0700) Resp:  [21-34] 23 (08/04 0752) BP: (96-149)/(70-103) 96/74 (08/04 0850) SpO2:  [93 %-100 %] 93 % (08/04 0752) Weight:  [98 kg (216 lb 0.8 oz)] 98 kg (216 lb 0.8 oz) (08/04 0500)  Physical Exam: BP Readings from Last 1 Encounters:  06/04/18 96/74     Wt Readings from Last 1 Encounters:  06/04/18 98 kg (216 lb 0.8 oz)    Weight change: -0.884 kg (-1 lb 15.2 oz) Body mass index is 32.85 kg/m. HEENT: [place]/AT, Eyes-Blue, Conjunctiva-Pink, Sclera-Non-icteric Neck: No JVD, No bruit, Trachea midline. Lungs:  Clear, Bilateral. Cardiac:  Regular rhythm, normal S1 and S2, no S3. II/VI systolic murmur. Abdomen:  Soft, non-tender. BS present. Extremities:  No edema present. No cyanosis. No clubbing. CNS: AxOx3, Cranial nerves grossly intact, moves all 4 extremities.  Skin: Warm and dry.   Intake/Output from previous day: 08/03 0701 - 08/04 0700 In: 2467.8 [I.V.:2467.8] Out: 2700 [Urine:2700]    Lab Results: BMET    Component Value Date/Time   NA 140 06/04/2018 0830   NA 139 06/03/2018 1229   NA 134 (L) 06/03/2018 0924   K 4.0 06/04/2018 0830   K 4.3 06/03/2018 1229   K 4.7 06/03/2018 0924   CL 104 06/04/2018 0830   CL 105 06/03/2018 1229   CL 99 06/03/2018 0924   CO2 25 06/04/2018 0830   CO2 21 (L) 06/03/2018 1229   CO2 22 06/03/2018 0924   GLUCOSE 238 (H) 06/04/2018 0830   GLUCOSE 396 (H) 06/03/2018 1229   GLUCOSE 528 (HH) 06/03/2018 0924   BUN 11 06/04/2018 0830   BUN 13 06/03/2018 1229   BUN 12 06/03/2018 0924   CREATININE 0.99 06/04/2018 0830   CREATININE 1.05 06/03/2018 1229   CREATININE 1.13 06/03/2018 0924   CREATININE 0.81 08/14/2014 1126   CREATININE 0.82 11/26/2013 1210   CALCIUM 8.4 (L) 06/04/2018 0830   CALCIUM 8.9 06/03/2018 1229   CALCIUM 9.0 06/03/2018 0924   GFRNONAA >60 06/04/2018 0830   GFRNONAA >60 06/03/2018 1229   GFRNONAA >60 06/03/2018 0924   GFRNONAA >89 08/14/2014 1126   GFRAA >60 06/04/2018 0830   GFRAA >60 06/03/2018 1229   GFRAA >60 06/03/2018 0924   GFRAA >89 08/14/2014 1126   CBC    Component Value Date/Time   WBC 13.9 (H) 06/04/2018 0830   RBC 4.33 06/04/2018 0830   HGB 13.2 06/04/2018 0830   HCT 41.4 06/04/2018 0830   PLT 226 06/04/2018 0830   MCV 95.6 06/04/2018 0830   MCH 30.5 06/04/2018 0830   MCHC 31.9 06/04/2018 0830   RDW 13.3 06/04/2018 0830   LYMPHSABS 4.8 (H) 06/03/2018 0442   MONOABS 1.3 (H) 06/03/2018 0442   EOSABS 0.3 06/03/2018 0442   BASOSABS 0.1 06/03/2018 0442   HEPATIC Function Panel Recent Labs    06/03/18 0442  PROT 7.2   HEMOGLOBIN A1C No components found for: HGA1C,  MPG CARDIAC ENZYMES Lab Results  Component Value Date   TROPONINI 3.19 (HH) 06/03/2018   TROPONINI 1.83 (HH) 06/03/2018   TROPONINI 0.56 (HH) 06/03/2018   BNP No results for input(s): PROBNP in the last 8760   hours. TSH Recent Labs    06/03/18 1414  TSH 0.733   CHOLESTEROL No results for input(s): CHOL in the last 8760 hours.  Scheduled Meds: . aspirin  81 mg Oral Daily  . folic acid  1 mg Oral Daily  . furosemide  40 mg Intravenous BID  . insulin aspart  0-5 Units Subcutaneous QHS  . insulin aspart  0-9 Units Subcutaneous TID WC  . multivitamin with minerals  1 tablet Oral Daily  . pneumococcal 23 valent vaccine  0.5 mL Intramuscular Tomorrow-1000  . sodium chloride flush  3 mL Intravenous Q12H  . thiamine  100 mg Oral Daily   Or  . thiamine  100 mg Intravenous Daily   Continuous Infusions: . sodium chloride 25 mL/hr at 06/03/18 1804  . dextrose 5 % and 0.45% NaCl    . heparin 1,750 Units/hr (06/04/18 0938)   PRN Meds:.acetaminophen  **OR** acetaminophen, LORazepam **OR** LORazepam, ondansetron **OR** ondansetron (ZOFRAN) IV, polyethylene glycol  Assessment/Plan: Acute coronary syndrome Acute pulmonary edema Possible acute diastolic left heart failure Mild DKA, improving Obesity Hypertension  Awaiting echocardiogram C.cath in AM.   LOS: 1 day    Darvis Croft  MD  06/04/2018, 11:21 AM     

## 2018-06-05 ENCOUNTER — Inpatient Hospital Stay (HOSPITAL_COMMUNITY): Payer: Self-pay

## 2018-06-05 ENCOUNTER — Encounter (HOSPITAL_COMMUNITY): Payer: Self-pay | Admitting: General Practice

## 2018-06-05 ENCOUNTER — Encounter (HOSPITAL_COMMUNITY)
Admission: EM | Discharge status: Against Medical Advice | Payer: Self-pay | Source: Home / Self Care | Attending: Internal Medicine

## 2018-06-05 DIAGNOSIS — J810 Acute pulmonary edema: Secondary | ICD-10-CM

## 2018-06-05 DIAGNOSIS — I2510 Atherosclerotic heart disease of native coronary artery without angina pectoris: Secondary | ICD-10-CM | POA: Diagnosis present

## 2018-06-05 DIAGNOSIS — E1310 Other specified diabetes mellitus with ketoacidosis without coma: Secondary | ICD-10-CM

## 2018-06-05 DIAGNOSIS — I249 Acute ischemic heart disease, unspecified: Secondary | ICD-10-CM

## 2018-06-05 HISTORY — DX: Acute pulmonary edema: J81.0

## 2018-06-05 HISTORY — PX: RIGHT/LEFT HEART CATH AND CORONARY ANGIOGRAPHY: CATH118266

## 2018-06-05 LAB — POCT I-STAT 3, ART BLOOD GAS (G3+)
Acid-base deficit: 1 mmol/L (ref 0.0–2.0)
BICARBONATE: 23.4 mmol/L (ref 20.0–28.0)
Bicarbonate: 24.7 mmol/L (ref 20.0–28.0)
O2 SAT: 61 %
O2 Saturation: 95 %
PCO2 ART: 36.3 mmHg (ref 32.0–48.0)
PCO2 ART: 41 mmHg (ref 32.0–48.0)
PH ART: 7.417 (ref 7.350–7.450)
TCO2: 24 mmol/L (ref 22–32)
TCO2: 26 mmol/L (ref 22–32)
pH, Arterial: 7.388 (ref 7.350–7.450)
pO2, Arterial: 32 mmHg — CL (ref 83.0–108.0)
pO2, Arterial: 76 mmHg — ABNORMAL LOW (ref 83.0–108.0)

## 2018-06-05 LAB — CBC
HEMATOCRIT: 39.8 % (ref 39.0–52.0)
HEMOGLOBIN: 13 g/dL (ref 13.0–17.0)
MCH: 30.7 pg (ref 26.0–34.0)
MCHC: 32.7 g/dL (ref 30.0–36.0)
MCV: 93.9 fL (ref 78.0–100.0)
Platelets: 219 10*3/uL (ref 150–400)
RBC: 4.24 MIL/uL (ref 4.22–5.81)
RDW: 13.1 % (ref 11.5–15.5)
WBC: 11.5 10*3/uL — AB (ref 4.0–10.5)

## 2018-06-05 LAB — MRSA PCR SCREENING: MRSA BY PCR: NEGATIVE

## 2018-06-05 LAB — ECHOCARDIOGRAM COMPLETE
HEIGHTINCHES: 68 in
Weight: 3407.43 oz

## 2018-06-05 LAB — HEPARIN LEVEL (UNFRACTIONATED): HEPARIN UNFRACTIONATED: 0.24 [IU]/mL — AB (ref 0.30–0.70)

## 2018-06-05 LAB — GLUCOSE, CAPILLARY
GLUCOSE-CAPILLARY: 170 mg/dL — AB (ref 70–99)
Glucose-Capillary: 174 mg/dL — ABNORMAL HIGH (ref 70–99)
Glucose-Capillary: 142 mg/dL — ABNORMAL HIGH (ref 70–99)
Glucose-Capillary: 295 mg/dL — ABNORMAL HIGH (ref 70–99)

## 2018-06-05 LAB — BASIC METABOLIC PANEL
ANION GAP: 9 (ref 5–15)
BUN: 13 mg/dL (ref 6–20)
CALCIUM: 8.2 mg/dL — AB (ref 8.9–10.3)
CHLORIDE: 101 mmol/L (ref 98–111)
CO2: 27 mmol/L (ref 22–32)
Creatinine, Ser: 0.98 mg/dL (ref 0.61–1.24)
GFR calc non Af Amer: >60 mL/min (ref >60)
Glucose, Bld: 168 mg/dL — ABNORMAL HIGH (ref 70–99)
Potassium: 3.3 mmol/L — ABNORMAL LOW (ref 3.5–5.1)
Sodium: 137 mmol/L (ref 135–145)

## 2018-06-05 LAB — POCT ACTIVATED CLOTTING TIME: ACTIVATED CLOTTING TIME: 125 s

## 2018-06-05 SURGERY — RIGHT/LEFT HEART CATH AND CORONARY ANGIOGRAPHY
Anesthesia: LOCAL

## 2018-06-05 MED ORDER — IOHEXOL 350 MG/ML SOLN
INTRAVENOUS | Status: DC | PRN
  Administered 2018-06-05: 50 mL

## 2018-06-05 MED ORDER — HEPARIN (PORCINE) IN NACL 1000-0.9 UT/500ML-% IV SOLN
INTRAVENOUS | Status: DC | PRN
  Administered 2018-06-05 (×2): 500 mL

## 2018-06-05 MED ORDER — LIDOCAINE HCL (PF) 1 % IJ SOLN
INTRAMUSCULAR | Status: DC | PRN
  Administered 2018-06-05: 15 mL

## 2018-06-05 MED ORDER — HEPARIN (PORCINE) IN NACL 100-0.45 UNIT/ML-% IJ SOLN
2150.0000 [IU]/h | INTRAMUSCULAR | Status: DC
  Administered 2018-06-06: 2150 [IU]/h via INTRAVENOUS
  Filled 2018-06-05: qty 250

## 2018-06-05 MED ORDER — SODIUM CHLORIDE 0.9% FLUSH
3.0000 mL | Freq: Two times a day (BID) | INTRAVENOUS | Status: DC
  Administered 2018-06-05: 3 mL via INTRAVENOUS

## 2018-06-05 MED ORDER — MIDAZOLAM HCL 2 MG/2ML IJ SOLN
INTRAMUSCULAR | Status: DC | PRN
  Administered 2018-06-05: 1 mg via INTRAVENOUS

## 2018-06-05 MED ORDER — FENTANYL CITRATE (PF) 100 MCG/2ML IJ SOLN
INTRAMUSCULAR | Status: AC
  Filled 2018-06-05: qty 2

## 2018-06-05 MED ORDER — SODIUM CHLORIDE 0.9 % IV SOLN: 250.0000 mL | INTRAVENOUS | Status: DC | PRN

## 2018-06-05 MED ORDER — FENTANYL CITRATE (PF) 100 MCG/2ML IJ SOLN
INTRAMUSCULAR | Status: DC | PRN
  Administered 2018-06-05: 25 ug via INTRAVENOUS

## 2018-06-05 MED ORDER — PERFLUTREN LIPID MICROSPHERE
1.0000 mL | INTRAVENOUS | Status: AC | PRN
  Administered 2018-06-05: 2 mL via INTRAVENOUS
  Filled 2018-06-05: qty 10

## 2018-06-05 MED ORDER — INSULIN ASPART PROT & ASPART (70-30 MIX) 100 UNIT/ML ~~LOC~~ SUSP
15.0000 [IU] | Freq: Two times a day (BID) | SUBCUTANEOUS | Status: DC
  Filled 2018-06-05: qty 10

## 2018-06-05 MED ORDER — MIDAZOLAM HCL 2 MG/2ML IJ SOLN
INTRAMUSCULAR | Status: AC
  Filled 2018-06-05: qty 2

## 2018-06-05 MED ORDER — ATORVASTATIN CALCIUM 80 MG PO TABS: 80.0000 mg | ORAL_TABLET | Freq: Every day | ORAL | Status: DC

## 2018-06-05 MED ORDER — HEPARIN (PORCINE) IN NACL 1000-0.9 UT/500ML-% IV SOLN
INTRAVENOUS | Status: AC
  Filled 2018-06-05: qty 1000

## 2018-06-05 MED ORDER — SODIUM CHLORIDE 0.9% FLUSH: 3.0000 mL | INTRAVENOUS | Status: DC | PRN

## 2018-06-05 MED ORDER — LIDOCAINE HCL (PF) 1 % IJ SOLN
INTRAMUSCULAR | Status: AC
  Filled 2018-06-05: qty 30

## 2018-06-05 MED ORDER — POTASSIUM CHLORIDE 10 MEQ/100ML IV SOLN
10.0000 meq | INTRAVENOUS | Status: AC
  Administered 2018-06-05 (×3): 10 meq via INTRAVENOUS
  Filled 2018-06-05 (×4): qty 100

## 2018-06-05 SURGICAL SUPPLY — 8 items
CATH INFINITI 5FR MULTPACK ANG (CATHETERS) ×1 IMPLANT
CATH SWAN GANZ 7F STRAIGHT (CATHETERS) ×1 IMPLANT
KIT HEART LEFT (KITS) ×2 IMPLANT
PACK CARDIAC CATHETERIZATION (CUSTOM PROCEDURE TRAY) ×2 IMPLANT
SHEATH PINNACLE 5F 10CM (SHEATH) ×1 IMPLANT
SHEATH PINNACLE 7F 10CM (SHEATH) ×1 IMPLANT
TRANSDUCER W/STOPCOCK (MISCELLANEOUS) ×3 IMPLANT
WIRE EMERALD 3MM-J .035X150CM (WIRE) ×1 IMPLANT

## 2018-06-05 NOTE — Progress Notes (Signed)
ANTICOAGULATION CONSULT NOTE   Pharmacy Consult for heparin Indication: multivessel CAD  Allergies  Allergen Reactions  . Lisinopril Other (See Comments)    Syncope  . Codeine Rash    Patient Measurements: Height: 5\' 8"  (172.7 cm) Weight: 212 lb 15.4 oz (96.6 kg) IBW/kg (Calculated) : 68.4 Heparin Dosing Weight: 89.5  Vital Signs: Temp: 98.3 F (36.8 C) (08/05 1230) Temp Source: Oral (08/05 1230) BP: 113/67 (08/05 1740) Pulse Rate: 87 (08/05 1740)  Labs: Recent Labs    06/03/18 0924 06/03/18 1229 06/03/18 1414 06/03/18 2106  06/04/18 0009 06/04/18 0830 06/04/18 1620 06/04/18 2053 06/05/18 0444  HGB 15.5  --   --   --   --  13.6 13.2  --   --  13.0  HCT 47.2  --   --   --   --  41.8 41.4  --   --  39.8  PLT 296  --   --   --   --  246 226  --   --  219  HEPARINUNFRC  --   --   --   --    < > 0.21* 0.26* 0.13* <0.10* 0.24*  CREATININE 1.13 1.05  --   --   --   --  0.99  --   --  0.98  TROPONINI 0.56*  --  1.83* 3.19*  --   --   --   --   --   --    < > = values in this interval not displayed.    Estimated Creatinine Clearance: 90.4 mL/min (by C-G formula based on SCr of 0.98 mg/dL).  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 TCTS consulted for possible CABG. Pharmacy consulted to resume heparin drip 8 hrs post sheath removal. Sheath out ~17:40. No bleeding noted.    Goal of Therapy:  Heparin level 0.3-0.7 units/ml Monitor platelets by anticoagulation protocol: Yes   Plan:  Resume heparin drip at 2150 units/hr with no bolus on 8/6 at 01:45 6 hr heparin level Daily heparin level and CBC Monitor for s/sx of bleeding F/U TCTS consult and plan   Loura BackJennifer [place], PharmD, BCPS Clinical Pharmacist Clinical phone for 06/05/2018 until 10p is [phone] Please check AMION for all Pharmacist numbers by unit 06/05/2018 6:11 PM

## 2018-06-05 NOTE — Progress Notes (Signed)
Inpatient Diabetes Program Recommendations  AACE/ADA: New Consensus Statement on Inpatient Glycemic Control (2015)  Target Ranges:  Prepandial:   less than 140 mg/dL      Peak postprandial:   less than 180 mg/dL (1-2 hours)      Critically ill patients:  140 - 180 mg/dL   Spoke with patient about diabetes and home regimen for diabetes control. Patient reports that he is followed by a PCP for diabetes management. Patient reports that he has taken Metformin for years and that he has had an A1c around 12% for years.  Discussed A1C results 12.4% this admission. Discussed glucose and A1C goals. Discussed importance of checking CBGs and maintaining good CBG control to prevent long-term and short-term complications.   Patient reports drinking 4-25oz beers/day. Patient says he "tried to watch his carb intake." Explained to patient that part of his hyperglycemia is from the beverages he is drinking.  Discussed insulin at time of discharge. informed patient that he will be prescribed 70/30 at d/c. Gave patient a Biomedical scientist. Told patient to check glucose at least BID before insulin injections. Showed patient the insulin pen. Reviewed contents of insulin flexpen starter kit. Reviewed all steps if insulin pen including attachment of needle, 2-unit air shot, dialing up dose, giving injection, removing needle, disposal of sharps, storage of unused insulin, disposal of insulin etc.   Explained how hyperglycemia leads to damage within blood vessels which lead to the common complications seen with uncontrolled diabetes. Explained to patient how Diabetes has effected his current medical conditions. Patient seems to not know what is going on with his care.   Spoke with Dr. Wyline Copas about patient concerns. Dr. Earlie Counts had in depth discussion with patient this am about his medical condition and plan of care.   Patient verbalized understanding of information discussed and he states that he has no further questions at  this time related to diabetes.  Discussed pt w/ CM. Follow up appointment has been made.  Thanks,  Tama Headings RN, MSN, BC-ADM Inpatient Diabetes Coordinator Team Pager [phone] (8a-5p)

## 2018-06-05 NOTE — Progress Notes (Addendum)
Inpatient Diabetes Program Recommendations  AACE/ADA: New Consensus Statement on Inpatient Glycemic Control (2015)  Target Ranges:  Prepandial:   less than 140 mg/dL      Peak postprandial:   less than 180 mg/dL (1-2 hours)      Critically ill patients:  140 - 180 mg/dL   Lab Results  Component Value Date   GLUCAP 170 (H) 06/05/2018   HGBA1C 12.4 (H) 06/03/2018   Review of Glycemic Control  Diabetes history: DM 2 Outpatient Diabetes medications: Metformin 1000 mg BID Current orders for Inpatient glycemic control: Lantus 20 units, Novolog 0-9 units tid, Novolog 0-5 units qhs, Novolog 8 units tid with meals  Inpatient Diabetes Program Recommendations:    A1c 12.4% per MD would benefit from insulin. Will see patient today to teach insulin and review A1c goals.  Noted patient does not have insurance, if he does not follow up in one of our clinics, patient can get 70/30 insulin via vial for $25/vial (would need syringes as well). 70/30 insulin via insulin pen for $43 a box (5 pens) (would need insulin pens).  Will give patient a [employer] product list to guide him.  Current dose of Lantus via 70/30 dose would be 15 units BID.  Thanks,  Christena DeemShannon Barrie Sigmund RN, MSN, BC-ADM Inpatient Diabetes Coordinator Team Pager [phone] (8a-5p)

## 2018-06-05 NOTE — Interval H&P Note (Signed)
History and Physical Interval Note:  06/05/2018 3:50 PM  Robert Sanchez  has presented today for surgery, with the diagnosis of cp  The various methods of treatment have been discussed with the patient and family. After consideration of risks, benefits and other options for treatment, the patient has consented to  Procedure(s): LEFT HEART CATH AND CORONARY ANGIOGRAPHY (N/A) as a surgical intervention .  The patient's history has been reviewed, patient examined, no change in status, stable for surgery.  I have reviewed the patient's chart and labs.  Questions were answered to the patient's satisfaction.     Ricki RodriguezAjay S Neilah Fulwider

## 2018-06-05 NOTE — Progress Notes (Signed)
Transferred-in from the cath lab by bed awake and alert. Non- compliant with instructions  not to bend  right knee to avoid bleeding. Bedrest emphasized till 10 pm tonight.

## 2018-06-05 NOTE — Progress Notes (Signed)
  Echocardiogram 2D Echocardiogram has been performed.  Tye SavoyCasey N Vicki Pasqual 06/05/2018, 11:48 AM

## 2018-06-05 NOTE — Progress Notes (Signed)
Heparin drip stopped.  Pt. Sent to cardiac Cath in stable condition.

## 2018-06-05 NOTE — Progress Notes (Signed)
Site area: Right groin a 5 french arterial and 7 french venous sheath was removed  Site Prior to Removal:  Level 0  Pressure Applied For 20 MINUTES    Bedrest Beginning at 1740p  Manual:   Yes.    Patient Status During Pull:  stable  Post Pull Groin Site:  Level 0  Post Pull Instructions Given:  Yes.    Post Pull Pulses Present:  Yes.    Dressing Applied:  Yes.    Comments:  VS remain stable.  Pt stated he was going home after sheath  Was out.  Advised pt about 4 hours of bedrest.

## 2018-06-05 NOTE — Progress Notes (Signed)
PROGRESS NOTE    GERMANY CHELF  ZOX:[id number] DOB: 1955-11-10 DOA: 06/03/2018 PCP: Patient, No Pcp Per    Brief Narrative:  62 y.o. male with medical history significant of DM2 who presents with acute SOB.  Mr. Menning was in his normal state of health yesterday when he went to sleep.  He woke with acute SOB and significant difficulty catching his breath.  He called 911 and was found on his porch tripoding.  He was placed on CPAP and brought to the ED.  He was found to be hypoxic in the ED to 83%.  His breathing has improved with CPAP and a dose of lasix.  CXR showed likely pulmonary edema.  He is not aware of a diagnosis of heart failure or CAD.  He does not see doctors regularly.  He was previously seeing a PCP, but has not for some years.  He is on metformin which he gets from urgent care.  He has chronic right leg neuropathy for which he used to take gabapentin.  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  He denies any chest pain, nausea, recent illness.  He does report coughing, increased thirst and polyuria.  He reports having pneumonia about 3-4 weeks ago and taking an antibiotic, but he cannot remember which one.    ED Course: In the ED, he was found to be in mild DKA with a bicarb of 17, pH of 7.3, Glucose of 621, AG of 18.  His initial troponin was 0.16.  His BNP was 758.  He had a WBC of 17.1 and a CXR showing mild cardiomegaly and likely pulmonary edema.  He was given lasix, ativan and nitroglycerin with good improvement in his SOB.   Assessment & Plan:   Active Problems:   Acute pulmonary edema (HCC)   DKA, type 2 (HCC)  Acute pulmonary edema secondary to acute systolic CHF, chronicity is unknown, new finding - Suspected acute CHF exacerbation - 2d echo ordered, pending - Troponin trending up, see below - Cardiology following - Will continue BID IV lasix - Initiallly required bipap, now weaned to Bier - Continue to diurese as tolerated - Recheck bmet in AM    DKA, mild -  Presenting AG 18, pH 7.3, Bicarb 17 -Improved with insulin gtt, since weaned to off - Glucose trends reviewed. Glucose remains in the mid-200's after 10 units lantus overnight -Will increase to 20 units lantus with 8 units meal coverage. Anticipate would require more insulin to achieve euglycemia - Hgb a1c of 12.4 noted. Will benefit from insulin at time of discharge. Patient apprehensive about insulin, primarily regarding cost - Started gabapentin for neuropathy - Have transitioned to 70-30 insulin at 15 units BID  Sinus tachycardia with elevated troponin - TSH within normal limits, reviewed - Had started metoprolol 25mg  po bid - Dig ordered per Cardiology  Leukocytosis - He reports recent pneumonia and antibiotic use - UA unremarkable and CXR unremarkable, both reviewed - Will recheck CBC in AM  DVT prophylaxis: heparin gtt Code Status: Full Family Communication: Pt in room,family not at bedside Disposition Plan: Uncertain at this time  Consultants:   Cardiology  Procedures:     Antimicrobials: Anti-infectives (From admission, onward)   None      Subjective: Without complaints. Reports breathing better  Objective: Vitals:   06/05/18 0500 06/05/18 0750 06/05/18 1228 06/05/18 1230  BP:   113/76   Pulse:   89   Resp:      Temp:  98.1 F (36.7 C) (!) 97.5 F (36.4 C) 98.3 F (36.8 C)  TempSrc:  Axillary Oral Oral  SpO2:   91%   Weight: 96.6 kg (212 lb 15.4 oz)     Height:        Intake/Output Summary (Last 24 hours) at 06/05/2018 1518 Last data filed at 06/05/2018 1239 Gross per 24 hour  Intake 0 ml  Output 5550 ml  Net -5550 ml   Filed Weights   06/03/18 0500 06/04/18 0500 06/05/18 0500  Weight: 98.9 kg (218 lb) 98 kg (216 lb 0.8 oz) 96.6 kg (212 lb 15.4 oz)    Examination: General exam: Conversant, in no acute distress Respiratory system: normal chest rise, clear, no audible wheezing Cardiovascular system: regular rhythm, s1-s2 Gastrointestinal  system: Nondistended, nontender, pos BS Central nervous system: No seizures, no tremors Extremities: No cyanosis, no joint deformities Skin: No rashes, no pallor Psychiatry: Affect normal // no auditory hallucinations   Data Reviewed: I have personally reviewed following labs and imaging studies  CBC: Recent Labs  Lab 06/03/18 0442 06/03/18 0924 06/04/18 0009 06/04/18 0830 06/05/18 0444  WBC 17.1* 13.6* 16.8* 13.9* 11.5*  NEUTROABS 10.6*  --   --   --   --   HGB 15.0 15.5 13.6 13.2 13.0  HCT 47.2 47.2 41.8 41.4 39.8  MCV 96.5 93.7 94.4 95.6 93.9  PLT 336 296 246 226 219   Basic Metabolic Panel: Recent Labs  Lab 06/03/18 0442 06/03/18 0924 06/03/18 1229 06/04/18 0830 06/05/18 0444  NA 135 134* 139 140 137  K 4.2 4.7 4.3 4.0 3.3*  CL 100 99 105 104 101  CO2 17* 22 21* 25 27  GLUCOSE 621* 528* 396* 238* 168*  BUN 10 12 13 11 13   CREATININE 1.19 1.13 1.05 0.99 0.98  CALCIUM 9.0 9.0 8.9 8.4* 8.2*   GFR: Estimated Creatinine Clearance: 90.4 mL/min (by C-G formula based on SCr of 0.98 mg/dL). Liver Function Tests: Recent Labs  Lab 06/03/18 0442  AST 93*  ALT 80*  ALKPHOS 109  BILITOT 0.8  PROT 7.2  ALBUMIN 3.6   No results for input(s): LIPASE, AMYLASE in the last 168 hours. No results for input(s): AMMONIA in the last 168 hours. Coagulation Profile: No results for input(s): INR, PROTIME in the last 168 hours. Cardiac Enzymes: Recent Labs  Lab 06/03/18 0924 06/03/18 1414 06/03/18 2106  TROPONINI 0.56* 1.83* 3.19*   BNP (last 3 results) No results for input(s): PROBNP in the last 8760 hours. HbA1C: Recent Labs    06/03/18 0924  HGBA1C 12.4*   CBG: Recent Labs  Lab 06/04/18 1204 06/04/18 1742 06/04/18 2130 06/05/18 0748 06/05/18 1236  GLUCAP 271* 301* 211* 170* 174*   Lipid Profile: No results for input(s): CHOL, HDL, LDLCALC, TRIG, CHOLHDL, LDLDIRECT in the last 72 hours. Thyroid Function Tests: Recent Labs    06/03/18 1414  TSH 0.733     Anemia Panel: No results for input(s): VITAMINB12, FOLATE, FERRITIN, TIBC, IRON, RETICCTPCT in the last 72 hours. Sepsis Labs: Recent Labs  Lab 06/04/18 2053  PROCALCITON 0.55  LATICACIDVEN 1.7    Recent Results (from the past 240 hour(s))  Culture, blood (routine x 2)     Status: None (Preliminary result)   Collection Time: 06/04/18  9:03 PM  Result Value Ref Range Status   Specimen Description BLOOD LEFT ARM  Final   Special Requests   Final    BOTTLES DRAWN AEROBIC AND ANAEROBIC Blood Culture adequate volume   Culture  Final    NO GROWTH < 24 HOURS Performed at [facility], [address] [place], [place] [address]    Report Status PENDING  Incomplete  Culture, blood (routine x 2)     Status: None (Preliminary result)   Collection Time: 06/04/18  9:07 PM  Result Value Ref Range Status   Specimen Description BLOOD LEFT WRIST  Final   Special Requests   Final    BOTTLES DRAWN AEROBIC AND ANAEROBIC Blood Culture adequate volume   Culture   Final    NO GROWTH < 24 HOURS Performed at [facility], [address] [place], [place] [address]    Report Status PENDING  Incomplete     Radiology Studies: Dg Chest Port 1v Same Day  Result Date: 06/04/2018 CLINICAL DATA:  CHF EXAM: PORTABLE CHEST 1 VIEW COMPARISON:  06/03/2018 FINDINGS: Cardiac shadow is stable. Diffuse changes of CHF are again identified and relatively stable given some variation in the technical factors of the image. No new focal abnormality is seen. IMPRESSION: Stable CHF. Electronically Signed   By: Alcide Clever M.D.   On: 06/04/2018 15:36    Scheduled Meds: . aspirin  81 mg Oral Daily  . folic acid  1 mg Oral Daily  . furosemide  40 mg Intravenous BID  . insulin aspart  0-5 Units Subcutaneous QHS  . insulin aspart  0-9 Units Subcutaneous TID WC  . insulin aspart protamine- aspart  15 Units Subcutaneous BID WC  . metoprolol tartrate  25 mg Oral BID  . multivitamin with minerals  1 tablet  Oral Daily  . sodium chloride flush  3 mL Intravenous Q12H  . thiamine  100 mg Oral Daily   Or  . thiamine  100 mg Intravenous Daily   Continuous Infusions: . sodium chloride 25 mL/hr at 06/04/18 1814  . dextrose 5 % and 0.45% NaCl    . famotidine (PEPCID) IV Stopped (06/05/18 1000)  . heparin 2,150 Units/hr (06/05/18 2130)     LOS: 2 days   Rickey Barbara, MD [facility] Pager [phone]  If 7PM-7AM, please contact night-coverage [email or web address] Password [facility] 06/05/2018, 3:18 PM

## 2018-06-05 NOTE — Progress Notes (Signed)
RN notified this RT that MD says not to worry about placing this pt on CPAP/BIPAP tonight. Pt was refusing stating he does not wear one at home and does not wish to wear one here at the hospital.

## 2018-06-05 NOTE — Progress Notes (Signed)
ANTICOAGULATION CONSULT NOTE  Pharmacy Consult for heparin Indication: ACS  Allergies  Allergen Reactions  . Lisinopril Other (See Comments)    Syncope  . Codeine Rash    Patient Measurements: Height: 5\' 8"  (172.7 cm) Weight: 216 lb 0.8 oz (98 kg) IBW/kg (Calculated) : 68.4 Heparin Dosing Weight: 89.5  Vital Signs: Temp: 97.6 F (36.4 C) (08/05 0455) Temp Source: Oral (08/05 0455) BP: 115/94 (08/05 0455) Pulse Rate: 83 (08/05 0455)  Labs: Recent Labs    06/03/18 0924 06/03/18 1229 06/03/18 1414 06/03/18 2106  06/04/18 0009 06/04/18 0830 06/04/18 1620 06/04/18 2053 06/05/18 0444  HGB 15.5  --   --   --   --  13.6 13.2  --   --  13.0  HCT 47.2  --   --   --   --  41.8 41.4  --   --  39.8  PLT 296  --   --   --   --  246 226  --   --  219  HEPARINUNFRC  --   --   --   --    < > 0.21* 0.26* 0.13* <0.10* 0.24*  CREATININE 1.13 1.05  --   --   --   --  0.99  --   --  0.98  TROPONINI 0.56*  --  1.83* 3.19*  --   --   --   --   --   --    < > = values in this interval not displayed.    Estimated Creatinine Clearance: 90.9 mL/min (by C-G formula based on SCr of 0.98 mg/dL).  Assessment: 62 y.o. male with SOB and elevated cardiac markers for heparin   Goal of Therapy:  Heparin level 0.3-0.7 units/ml Monitor platelets by anticoagulation protocol: Yes   Plan:   Increase Heparin 2150 units/hr Follow up after cath today   Geannie RisenGreg Kaleigh Spiegelman, PharmD, BCPS  06/05/2018,6:01 AM

## 2018-06-06 ENCOUNTER — Encounter (HOSPITAL_COMMUNITY): Payer: Self-pay | Admitting: Cardiovascular Disease

## 2018-06-06 LAB — CBC
HCT: 42.5 % (ref 39.0–52.0)
Hemoglobin: 13.7 g/dL (ref 13.0–17.0)
MCH: 30 pg (ref 26.0–34.0)
MCHC: 32.2 g/dL (ref 30.0–36.0)
MCV: 93 fL (ref 78.0–100.0)
PLATELETS: 234 10*3/uL (ref 150–400)
RBC: 4.57 MIL/uL (ref 4.22–5.81)
RDW: 12.8 % (ref 11.5–15.5)
WBC: 9.5 10*3/uL (ref 4.0–10.5)

## 2018-06-06 LAB — GLUCOSE, CAPILLARY: Glucose-Capillary: 182 mg/dL — ABNORMAL HIGH (ref 70–99)

## 2018-06-06 LAB — BASIC METABOLIC PANEL
Anion gap: 9 (ref 5–15)
BUN: 13 mg/dL (ref 6–20)
CALCIUM: 8.4 mg/dL — AB (ref 8.9–10.3)
CO2: 27 mmol/L (ref 22–32)
Chloride: 100 mmol/L (ref 98–111)
Creatinine, Ser: 0.87 mg/dL (ref 0.61–1.24)
GFR calc Af Amer: >60 mL/min (ref >60)
Glucose, Bld: 153 mg/dL — ABNORMAL HIGH (ref 70–99)
POTASSIUM: 3.6 mmol/L (ref 3.5–5.1)
SODIUM: 136 mmol/L (ref 135–145)

## 2018-06-06 LAB — HEPARIN LEVEL (UNFRACTIONATED): Heparin Unfractionated: 0.19 IU/mL — ABNORMAL LOW (ref 0.30–0.70)

## 2018-06-06 MED ORDER — HEPARIN (PORCINE) IN NACL 100-0.45 UNIT/ML-% IJ SOLN: 2300.0000 [IU]/h | INTRAMUSCULAR | Status: DC

## 2018-06-06 MED ORDER — FAMOTIDINE 20 MG PO TABS: 20.0000 mg | ORAL_TABLET | Freq: Two times a day (BID) | ORAL | Status: DC

## 2018-06-06 NOTE — Progress Notes (Signed)
Pt. pulled out iv line  And monitor leads claiming that he is going home. MD made aware, pt .signed out against medical advise despite explanation given for further  Medical regimen.

## 2018-06-06 NOTE — Discharge Summary (Signed)
Physician Discharge Summary  Patient ID: MIGEL HANNIS MRN: [id number] DOB/AGE: 07-14-1956 62 y.o.  Admit date: 06/03/2018 Discharge date: 06/06/2018  Admission Diagnoses: Acute pulmonary edema Mild DKA  Sinus tachycardia with elevated troponin Leukocytosis  Discharge Diagnoses:  Principle problem: Acute coronary syndrome Active Problems:   Acute pulmonary edema (HCC)   Acute on chronic systolic left heart failure   DKA, type 2 (HCC)   Multivessel, native vessel, CAD   Moderate mitral regurgitation   Obesity   Hypertension   Dietary and medication non-compliance    Discharged Condition: fair  Hospital Course: 62 year old male with DM type 2 had acute shortness of breath. He required CPAP use foe 2 days. IV lasix improved his pulmonary edema with BNP of 758. He was simultaneously treated for mild DKA with blood sugar of 621 mg/dL with insulin drip. His troponin I was elevated from demand ischemia. A small NSTEMI can not be excluded post cardiac catheterization findings. Echocardiogram showed poor LV systolic function with EF of 20-25 %. Hence he underwent R + L heart catheterization. His right heart pressures were minimally elevated but he had significant left main coronary artery disease with total occlusion of LCX at origin and RCA proximally.  CVTS surgeon was consulted post patient agreed to see him and not leave prematurely. He changed his mind in 1-2 hours and left AMA while I was in cath lab on another case.  Hopefully he will return to the hospital post taking care of his personal problem.  Consults: cardiology  Significant Diagnostic Studies: labs: Elevated WBC count other wise unremarkable. Low CO2, elevated sugar of 621 mg otherwise unremarkable. Hgb A1C of 12.4 from 13.1 1 year ago. Troponin I levels were 0.56, 1.83 and 3.19. BNP was 758.3.  EKG: ST, Poor r wave progression, IVCD. 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  Chest X-ray : Cardiomegaly with  pulmonary edema.  Echocardiogram: Poor LV systolic function with EF 20-25 %. Moderate MR.  R + L Heart catheterization: Minimally elevated right heart pressures and pulmonary wedge pressure. Significant distal LM and Proximal LAD disease with total occlusion of LCx at origin and total occlusion of RCA proximally.  Treatments: cardiac meds: IV fluids, IV insulin, IV lasix, Lisinopril, IV lanoxin.  Discharge Exam: Blood pressure (!) 112/98, pulse 84, temperature 97.8 F (36.6 C), temperature source Oral, resp. rate 13, height 5\' 8"  (1.727 m), weight 96.6 kg (212 lb 15.4 oz), SpO2 98 %. General appearance: alert, cooperative and appears stated age. Head: Normocephalic, atraumatic. Eyes: Blue eyes, pink conjunctiva, corneas clear. PERRL, EOM's intact.  Neck: No adenopathy, no carotid bruit, no JVD, supple, symmetrical, trachea midline and thyroid not enlarged. Resp: Clear to auscultation bilaterally. Cardio: Regular rate and rhythm, S1, S2 normal, II/VI systolic murmur, S3. no click or rub. GI: Soft, non-tender; bowel sounds normal; no organomegaly. Extremities: No edema, cyanosis or clubbing. No right groin hematoma. Skin: Warm and dry.  Neurologic: Alert and oriented X 3, normal strength and tone. Normal coordination and gait.  Disposition: AMA.   Allergies as of 06/06/2018      Reactions   Lisinopril Other (See Comments)   Syncope   Codeine Rash      Medication List    ASK your doctor about these medications   acetic acid-hydrocortisone OTIC solution Commonly known as:  VOSOL-HC Place 4 drops into the left ear 2 (two) times daily.   gabapentin 100 MG capsule Commonly known as:  NEURONTIN Take 1 capsule (100  mg total) by mouth at bedtime.   lisinopril 2.5 MG tablet Commonly known as:  ZESTRIL Take 1 tablet (2.5 mg total) by mouth daily.   metFORMIN 1000 MG tablet Commonly known as:  GLUCOPHAGE Take 1 tablet (1,000 mg total) by mouth 2 (two) times daily with a meal.    traMADol 50 MG tablet Commonly known as:  ULTRAM Take 1 tablet (50 mg total) by mouth every 8 (eight) hours as needed. May cause constipation      Follow-up Information    [facility]. Go on 06/14/2018.   Why:  9:30am with Georgian CoAngela McClung PA Contact information: [address] [place] [id number] [phone]          Signed: Ricki Rodriguezjay S Kohlton Gilpatrick 06/06/2018, 10:27 PM

## 2018-06-06 NOTE — Plan of Care (Signed)

## 2018-06-06 NOTE — Progress Notes (Signed)
ANTICOAGULATION CONSULT NOTE   Pharmacy Consult for heparin Indication: multivessel CAD  Allergies  Allergen Reactions  . Lisinopril Other (See Comments)    Syncope  . Codeine Rash    Patient Measurements: Height: 5\' 8"  (172.7 cm) Weight: 212 lb 15.4 oz (96.6 kg) IBW/kg (Calculated) : 68.4 Heparin Dosing Weight: 89.5  Vital Signs: Temp: 97.8 F (36.6 C) (08/06 0740) Temp Source: Oral (08/06 0740) BP: 112/98 (08/06 0740) Pulse Rate: 84 (08/06 0740)  Labs: Recent Labs    06/03/18 0924  06/03/18 1414 06/03/18 2106  06/04/18 0830  06/04/18 2053 06/05/18 0444 06/06/18 0218 06/06/18 0715  HGB 15.5  --   --   --    < > 13.2  --   --  13.0  --  13.7  HCT 47.2  --   --   --    < > 41.4  --   --  39.8  --  42.5  PLT 296  --   --   --    < > 226  --   --  219  --  234  HEPARINUNFRC  --   --   --   --    < > 0.26*   < > <0.10* 0.24*  --  0.19*  CREATININE 1.13   < >  --   --   --  0.99  --   --  0.98 0.87  --   TROPONINI 0.56*  --  1.83* 3.19*  --   --   --   --   --   --   --    < > = values in this interval not displayed.    Estimated Creatinine Clearance: 101.8 mL/min (by C-G formula based on SCr of 0.87 mg/dL).  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 TCTS consulted for possible CABG.   Heparin was restarted last night 8 hours after sheath removal. Heparin level this morning was subtherapeutic at 0.19, on 2150 units/hr. Hg 13.7, plt 234. No s/sx of bleeding or infusion issues.     Goal of Therapy:  Heparin level 0.3-0.7 units/ml Monitor platelets by anticoagulation protocol: Yes   Plan:  Increase heparin infusion to 2300 units/hr 6 hr heparin level Daily heparin level and CBC Monitor for s/sx of bleeding F/U TCTS consult and plan   Girard CooterKimberly Perkins, PharmD Clinical Pharmacist  Pager: [phone] Phone: [phone] Please check AMION for all Pharmacist numbers by unit 06/06/2018 8:54 AM

## 2018-06-08 ENCOUNTER — Encounter (HOSPITAL_COMMUNITY): Payer: Self-pay | Admitting: *Deleted

## 2018-06-08 ENCOUNTER — Ambulatory Visit: Payer: Self-pay | Admitting: Family Medicine

## 2018-06-08 ENCOUNTER — Inpatient Hospital Stay (HOSPITAL_COMMUNITY)
Admission: EM | Admit: 2018-06-08 | Discharge: 2018-06-19 | DRG: 236 | Disposition: A | Discharge status: Home / Self Care | Payer: Self-pay | Attending: Cardiothoracic Surgery | Admitting: Cardiothoracic Surgery

## 2018-06-08 ENCOUNTER — Emergency Department (HOSPITAL_COMMUNITY): Payer: Self-pay

## 2018-06-08 DIAGNOSIS — J449 Chronic obstructive pulmonary disease, unspecified: Secondary | ICD-10-CM | POA: Diagnosis present

## 2018-06-08 DIAGNOSIS — N179 Acute kidney failure, unspecified: Secondary | ICD-10-CM | POA: Diagnosis not present

## 2018-06-08 DIAGNOSIS — I214 Non-ST elevation (NSTEMI) myocardial infarction: Principal | ICD-10-CM | POA: Diagnosis present

## 2018-06-08 DIAGNOSIS — Z6831 Body mass index (BMI) 31.0-31.9, adult: Secondary | ICD-10-CM

## 2018-06-08 DIAGNOSIS — K5900 Constipation, unspecified: Secondary | ICD-10-CM | POA: Diagnosis present

## 2018-06-08 DIAGNOSIS — I110 Hypertensive heart disease with heart failure: Secondary | ICD-10-CM | POA: Diagnosis present

## 2018-06-08 DIAGNOSIS — Z7984 Long term (current) use of oral hypoglycemic drugs: Secondary | ICD-10-CM

## 2018-06-08 DIAGNOSIS — I340 Nonrheumatic mitral (valve) insufficiency: Secondary | ICD-10-CM | POA: Diagnosis present

## 2018-06-08 DIAGNOSIS — I255 Ischemic cardiomyopathy: Secondary | ICD-10-CM | POA: Diagnosis present

## 2018-06-08 DIAGNOSIS — F17210 Nicotine dependence, cigarettes, uncomplicated: Secondary | ICD-10-CM | POA: Diagnosis present

## 2018-06-08 DIAGNOSIS — D62 Acute posthemorrhagic anemia: Secondary | ICD-10-CM | POA: Diagnosis not present

## 2018-06-08 DIAGNOSIS — I445 Left posterior fascicular block: Secondary | ICD-10-CM | POA: Diagnosis present

## 2018-06-08 DIAGNOSIS — Z833 Family history of diabetes mellitus: Secondary | ICD-10-CM

## 2018-06-08 DIAGNOSIS — Z888 Allergy status to other drugs, medicaments and biological substances status: Secondary | ICD-10-CM

## 2018-06-08 DIAGNOSIS — I249 Acute ischemic heart disease, unspecified: Secondary | ICD-10-CM

## 2018-06-08 DIAGNOSIS — Z885 Allergy status to narcotic agent status: Secondary | ICD-10-CM

## 2018-06-08 DIAGNOSIS — I493 Ventricular premature depolarization: Secondary | ICD-10-CM | POA: Diagnosis not present

## 2018-06-08 DIAGNOSIS — E1151 Type 2 diabetes mellitus with diabetic peripheral angiopathy without gangrene: Secondary | ICD-10-CM | POA: Diagnosis present

## 2018-06-08 DIAGNOSIS — E1165 Type 2 diabetes mellitus with hyperglycemia: Secondary | ICD-10-CM | POA: Diagnosis not present

## 2018-06-08 DIAGNOSIS — E669 Obesity, unspecified: Secondary | ICD-10-CM | POA: Diagnosis present

## 2018-06-08 DIAGNOSIS — Z951 Presence of aortocoronary bypass graft: Secondary | ICD-10-CM

## 2018-06-08 DIAGNOSIS — I2510 Atherosclerotic heart disease of native coronary artery without angina pectoris: Secondary | ICD-10-CM | POA: Diagnosis present

## 2018-06-08 DIAGNOSIS — Z8249 Family history of ischemic heart disease and other diseases of the circulatory system: Secondary | ICD-10-CM

## 2018-06-08 DIAGNOSIS — I472 Ventricular tachycardia: Secondary | ICD-10-CM | POA: Diagnosis not present

## 2018-06-08 DIAGNOSIS — E871 Hypo-osmolality and hyponatremia: Secondary | ICD-10-CM | POA: Diagnosis not present

## 2018-06-08 DIAGNOSIS — E1141 Type 2 diabetes mellitus with diabetic mononeuropathy: Secondary | ICD-10-CM | POA: Diagnosis present

## 2018-06-08 DIAGNOSIS — F1010 Alcohol abuse, uncomplicated: Secondary | ICD-10-CM | POA: Diagnosis present

## 2018-06-08 DIAGNOSIS — E876 Hypokalemia: Secondary | ICD-10-CM | POA: Diagnosis not present

## 2018-06-08 DIAGNOSIS — I5040 Unspecified combined systolic (congestive) and diastolic (congestive) heart failure: Secondary | ICD-10-CM | POA: Diagnosis present

## 2018-06-08 DIAGNOSIS — I313 Pericardial effusion (noninflammatory): Secondary | ICD-10-CM | POA: Diagnosis present

## 2018-06-08 LAB — COMPREHENSIVE METABOLIC PANEL
ALK PHOS: 69 U/L (ref 38–126)
ALT: 70 U/L — ABNORMAL HIGH (ref 0–44)
ANION GAP: 11 (ref 5–15)
AST: 74 U/L — ABNORMAL HIGH (ref 15–41)
Albumin: 3.4 g/dL — ABNORMAL LOW (ref 3.5–5.0)
BILIRUBIN TOTAL: 1.2 mg/dL (ref 0.3–1.2)
BUN: 18 mg/dL (ref 6–20)
CO2: 21 mmol/L — ABNORMAL LOW (ref 22–32)
Calcium: 8.8 mg/dL — ABNORMAL LOW (ref 8.9–10.3)
Chloride: 102 mmol/L (ref 98–111)
Creatinine, Ser: 1.28 mg/dL — ABNORMAL HIGH (ref 0.61–1.24)
GFR, EST NON AFRICAN AMERICAN: 59 mL/min — AB (ref >60)
Glucose, Bld: 325 mg/dL — ABNORMAL HIGH (ref 70–99)
POTASSIUM: 4.2 mmol/L (ref 3.5–5.1)
Sodium: 134 mmol/L — ABNORMAL LOW (ref 135–145)
TOTAL PROTEIN: 8 g/dL (ref 6.5–8.1)

## 2018-06-08 LAB — TROPONIN I
TROPONIN I: 0.31 ng/mL — AB (ref <0.03)
TROPONIN I: 0.32 ng/mL — AB (ref <0.03)

## 2018-06-08 LAB — CBC
HCT: 41.5 % (ref 39.0–52.0)
Hemoglobin: 13.7 g/dL (ref 13.0–17.0)
MCH: 30.4 pg (ref 26.0–34.0)
MCHC: 33 g/dL (ref 30.0–36.0)
MCV: 92 fL (ref 78.0–100.0)
PLATELETS: 278 10*3/uL (ref 150–400)
RBC: 4.51 MIL/uL (ref 4.22–5.81)
RDW: 12.5 % (ref 11.5–15.5)
WBC: 10.6 10*3/uL — ABNORMAL HIGH (ref 4.0–10.5)

## 2018-06-08 LAB — HEPARIN LEVEL (UNFRACTIONATED): HEPARIN UNFRACTIONATED: 0.47 [IU]/mL (ref 0.30–0.70)

## 2018-06-08 LAB — LIPASE, BLOOD: LIPASE: 33 U/L (ref 11–51)

## 2018-06-08 LAB — GLUCOSE, CAPILLARY
GLUCOSE-CAPILLARY: 242 mg/dL — AB (ref 70–99)
GLUCOSE-CAPILLARY: 142 mg/dL — AB (ref 70–99)

## 2018-06-08 LAB — CBG MONITORING, ED: Glucose-Capillary: 265 mg/dL — ABNORMAL HIGH (ref 70–99)

## 2018-06-08 LAB — I-STAT TROPONIN, ED: TROPONIN I, POC: 0.28 ng/mL — AB (ref 0.00–0.08)

## 2018-06-08 MED ORDER — METFORMIN HCL 500 MG PO TABS
1000.0000 mg | ORAL_TABLET | Freq: Two times a day (BID) | ORAL | Status: DC
  Administered 2018-06-08 – 2018-06-10 (×4): 1000 mg via ORAL
  Filled 2018-06-08 (×4): qty 2

## 2018-06-08 MED ORDER — CARVEDILOL 3.125 MG PO TABS
3.1250 mg | ORAL_TABLET | Freq: Two times a day (BID) | ORAL | Status: DC
  Administered 2018-06-08 – 2018-06-11 (×7): 3.125 mg via ORAL
  Filled 2018-06-08 (×7): qty 1

## 2018-06-08 MED ORDER — INSULIN ASPART 100 UNIT/ML ~~LOC~~ SOLN
0.0000 [IU] | Freq: Three times a day (TID) | SUBCUTANEOUS | Status: DC
  Administered 2018-06-08: 7 [IU] via SUBCUTANEOUS
  Administered 2018-06-08: 11 [IU] via SUBCUTANEOUS
  Administered 2018-06-09: 7 [IU] via SUBCUTANEOUS
  Filled 2018-06-08: qty 1

## 2018-06-08 MED ORDER — ASPIRIN 81 MG PO CHEW
324.0000 mg | CHEWABLE_TABLET | ORAL | Status: AC
  Administered 2018-06-08: 324 mg via ORAL
  Filled 2018-06-08: qty 4

## 2018-06-08 MED ORDER — ASPIRIN 300 MG RE SUPP: 300.0000 mg | RECTAL | Status: AC

## 2018-06-08 MED ORDER — MAGNESIUM HYDROXIDE 400 MG/5ML PO SUSP
30.0000 mL | Freq: Every day | ORAL | Status: DC | PRN
  Administered 2018-06-08 – 2018-06-09 (×3): 30 mL via ORAL
  Filled 2018-06-08 (×3): qty 30

## 2018-06-08 MED ORDER — FUROSEMIDE 40 MG PO TABS
40.0000 mg | ORAL_TABLET | Freq: Every day | ORAL | Status: DC
  Administered 2018-06-08 – 2018-06-11 (×4): 40 mg via ORAL
  Filled 2018-06-08: qty 2
  Filled 2018-06-08 (×3): qty 1

## 2018-06-08 MED ORDER — NITROGLYCERIN 0.4 MG SL SUBL: 0.4000 mg | SUBLINGUAL_TABLET | SUBLINGUAL | Status: DC | PRN

## 2018-06-08 MED ORDER — HEPARIN (PORCINE) IN NACL 100-0.45 UNIT/ML-% IJ SOLN
2300.0000 [IU]/h | INTRAMUSCULAR | Status: DC
  Administered 2018-06-08 – 2018-06-09 (×3): 2200 [IU]/h via INTRAVENOUS
  Administered 2018-06-09 – 2018-06-11 (×5): 2300 [IU]/h via INTRAVENOUS
  Filled 2018-06-08 (×9): qty 250

## 2018-06-08 MED ORDER — ACETAMINOPHEN 325 MG PO TABS: 650.0000 mg | ORAL_TABLET | ORAL | Status: DC | PRN

## 2018-06-08 MED ORDER — ASPIRIN EC 81 MG PO TBEC
81.0000 mg | DELAYED_RELEASE_TABLET | Freq: Every day | ORAL | Status: DC
  Administered 2018-06-09 – 2018-06-11 (×3): 81 mg via ORAL
  Filled 2018-06-08 (×3): qty 1

## 2018-06-08 MED ORDER — ONDANSETRON HCL 4 MG/2ML IJ SOLN: 4.0000 mg | Freq: Four times a day (QID) | INTRAMUSCULAR | Status: DC | PRN

## 2018-06-08 MED ORDER — SPIRONOLACTONE 12.5 MG HALF TABLET
12.5000 mg | ORAL_TABLET | Freq: Two times a day (BID) | ORAL | Status: DC
  Administered 2018-06-08 – 2018-06-11 (×8): 12.5 mg via ORAL
  Filled 2018-06-08 (×9): qty 1

## 2018-06-08 MED ORDER — HEPARIN BOLUS VIA INFUSION
4000.0000 [IU] | Freq: Once | INTRAVENOUS | Status: AC
  Administered 2018-06-08: 4000 [IU] via INTRAVENOUS
  Filled 2018-06-08: qty 4000

## 2018-06-08 NOTE — ED Triage Notes (Signed)
Pt in c/o abdominal pain and chest/back pain that started last Friday night, seen at that time and admitted, pt in due to worsening symptoms, no distress on arrival

## 2018-06-08 NOTE — ED Notes (Signed)
[facility]andra-Heart Healthy/Carb Modified Lunch Tray Ordered @ 1240-per RN-called by Marylene LandAngela

## 2018-06-08 NOTE — ED Provider Notes (Signed)
[facility] EMERGENCY DEPARTMENT Provider Note   CSN: [id number] Arrival date & time: 06/08/18  7829     History   Chief Complaint Chief Complaint  Patient presents with  . Abdominal Pain  . Chest Pain    HPI Robert Sanchez is a 62 y.o. male.  The history is provided by the patient. No language interpreter was used.  Abdominal Pain    Chest Pain   Associated symptoms include abdominal pain.   Robert Sanchez is a 62 y.o. male who presents to the Emergency Department complaining of abdominal pain/chest pain. Since to the emergency department complaining of chest pain and abdominal pain. He was recently admitted to the hospital for chest pain and left AMA two days ago. He is not completely sure what occurred during his hospitalization but stated that he had to leave urgently to care for his animals. He did have some chest pain at the time of hospital discharge. He states that last night he had difficulty sleeping due to pain in his chest with shortness of breath and abdominal pain. He states that his pain feels like indigestion. He also endorses numbness to his left foot that is new since hospitalization. He denies any fevers. He is a cough productive of sputum. No nausea or vomiting. He does endorse alcohol abuse and describes himself as a beercaholic. Past Medical History:  Diagnosis Date  . Acute pulmonary edema (HCC) 06/05/2018  . Arthritis   . Diabetes mellitus without complication (HCC)   . Diabetic neuropathy [facility])     Patient Active Problem List   Diagnosis Date Noted  . Acute coronary syndrome (HCC) 06/05/2018  . Acute pulmonary edema (HCC) 06/03/2018  . DKA, type 2 (HCC) 06/03/2018  . Diabetic acidosis without coma (HCC)   . Sebaceous cyst 08/23/2016  . Acute pain of right knee 08/23/2016  . Hidradenitis 08/23/2016  . Right hip pain 08/14/2014  . Diabetes (HCC) 11/27/2013  . Arthritis 11/27/2013  . Neuropathy in diabetes (HCC) 11/27/2013     Past Surgical History:  Procedure Laterality Date  . KNEE ARTHROSCOPY    . RIGHT/LEFT HEART CATH AND CORONARY ANGIOGRAPHY N/A 06/05/2018   Procedure: RIGHT/LEFT HEART CATH AND CORONARY ANGIOGRAPHY;  Surgeon: Orpah Cobb, MD;  Location: [facility] INVASIVE CV LAB;  Service: Cardiovascular;  Laterality: N/A;  . SPINE SURGERY          Home Medications    Prior to Admission medications   Medication Sig Start Date End Date Taking? Authorizing Provider  metFORMIN (GLUCOPHAGE) 1000 MG tablet Take 1 tablet (1,000 mg total) by mouth 2 (two) times daily with a meal. 08/23/16  Yes Myra Rude, MD    Family History Family History  Problem Relation Age of Onset  . Diabetes Mother   . Cancer Father   . Heart disease Father     Social History Social History   Tobacco Use  . Smoking status: Current Every Day Smoker    Packs/day: 1.00    Years: 40.00    Pack years: 40.00  . Smokeless tobacco: Never Used  Substance Use Topics  . Alcohol use: Yes  . Drug use: No     Allergies   Lisinopril and Codeine   Review of Systems Review of Systems  Cardiovascular: Positive for chest pain.  Gastrointestinal: Positive for abdominal pain.  All other systems reviewed and are negative.    Physical Exam Updated Vital Signs BP (!) 155/87   Pulse 88   Temp  98.2 F (36.8 C) (Oral)   Resp 16   SpO2 98%   Physical Exam  Constitutional: He is oriented to person, place, and time. He appears well-developed and well-nourished.  HENT:  Head: Normocephalic and atraumatic.  Cardiovascular: Normal rate and regular rhythm.  No murmur heard. Pulmonary/Chest: Effort normal and breath sounds normal. No respiratory distress.  Abdominal: Soft. There is no rebound and no guarding.  Mild generalized abdominal tenderness without guarding or rebound  Musculoskeletal: He exhibits no edema or tenderness.  2+ DP pulses bilaterally  Neurological: He is alert and oriented to person, place, and time.   Skin: Skin is warm and dry.  Psychiatric: He has a normal mood and affect. His behavior is normal.  Nursing note and vitals reviewed.    ED Treatments / Results  Labs (all labs ordered are listed, but only abnormal results are displayed) Labs Reviewed  CBC - Abnormal; Notable for the following components:      Result Value   WBC 10.6 (*)    All other components within normal limits  COMPREHENSIVE METABOLIC PANEL - Abnormal; Notable for the following components:   Sodium 134 (*)    CO2 21 (*)    Glucose, Bld 325 (*)    Creatinine, Ser 1.28 (*)    Calcium 8.8 (*)    Albumin 3.4 (*)    AST 74 (*)    ALT 70 (*)    GFR calc non Af Amer 59 (*)    All other components within normal limits  TROPONIN I - Abnormal; Notable for the following components:   Troponin I 0.31 (*)    All other components within normal limits  I-STAT TROPONIN, ED - Abnormal; Notable for the following components:   Troponin i, poc 0.28 (*)    All other components within normal limits  CBG MONITORING, ED - Abnormal; Notable for the following components:   Glucose-Capillary 265 (*)    All other components within normal limits  LIPASE, BLOOD  TROPONIN I  TROPONIN I  HEPARIN LEVEL (UNFRACTIONATED)    EKG EKG Interpretation  Date/Time:  Thursday June 08 2018 09:30:10 EDT Ventricular Rate:  98 PR Interval:  154 QRS Duration: 116 QT Interval:  354 QTC Calculation: 451 R Axis:   111 Text Interpretation:  Normal sinus rhythm Left posterior fascicular block Cannot rule out Inferior infarct , age undetermined Abnormal ECG Confirmed by Tilden Fossaees, Daryon Remmert [phone]([id number]) on 06/08/2018 9:30:41 AM   Radiology Dg Chest 2 View  Result Date: 06/08/2018 CLINICAL DATA:  LEFT chest pain and abdominal pain since last night, hospitalized last week with possible MI and shortness of breath, history diabetes mellitus, smoker, pulmonary edema EXAM: CHEST - 2 VIEW COMPARISON:  06/04/2018 FINDINGS: Enlargement of cardiac silhouette  with pulmonary vascular congestion. Distal contours stable. Improved interstitial edema. No new infiltrate, pleural effusion or pneumothorax. Scattered endplate spur formation thoracic spine. IMPRESSION: Improved pulmonary edema. Electronically Signed   By: Ulyses SouthwardMark  Boles M.D.   On: 06/08/2018 10:07    Procedures Procedures (including critical care time)  Medications Ordered in ED Medications  aspirin EC tablet 81 mg (has no administration in time range)  nitroGLYCERIN (NITROSTAT) SL tablet 0.4 mg (has no administration in time range)  acetaminophen (TYLENOL) tablet 650 mg (has no administration in time range)  ondansetron (ZOFRAN) injection 4 mg (has no administration in time range)  metFORMIN (GLUCOPHAGE) tablet 1,000 mg (has no administration in time range)  carvedilol (COREG) tablet 3.125 mg (has no administration in time  range)  spironolactone (ALDACTONE) tablet 12.5 mg (12.5 mg Oral Given 06/08/18 1335)  furosemide (LASIX) tablet 40 mg (40 mg Oral Given 06/08/18 1239)  insulin aspart (novoLOG) injection 0-20 Units (11 Units Subcutaneous Given 06/08/18 1413)  heparin ADULT infusion 100 units/mL (25000 units/245mL sodium chloride 0.45%) (2,200 Units/hr Intravenous New Bag/Given 06/08/18 1241)  aspirin chewable tablet 324 mg (324 mg Oral Given 06/08/18 1239)    Or  aspirin suppository 300 mg ( Rectal See Alternative 06/08/18 1239)  heparin bolus via infusion 4,000 Units (4,000 Units Intravenous Bolus from Bag 06/08/18 1244)     Initial Impression / Assessment and Plan / ED Course  I have reviewed the triage vital signs and the nursing notes.  Pertinent labs & imaging results that were available during my care of the patient were reviewed by me and considered in my medical decision making (see chart for details).     Patient here for evaluation of chest pain, shortness of breath after recent admission for acute coronary syndrome. He did leave against medical advice. He returns today for evaluation.  He is non-toxic appearing on examination. Troponin is elevated, similar to compared to priors. Discussed with Dr. Algie Coffer with cardiology, who plans to admit for further management and evaluation.  Final Clinical Impressions(s) / ED Diagnoses   Final diagnoses:  None    ED Discharge Orders    None       Tilden Fossa, MD 06/08/18 1601

## 2018-06-08 NOTE — Plan of Care (Signed)

## 2018-06-08 NOTE — Progress Notes (Signed)
ANTICOAGULATION CONSULT NOTE - Follow Up Consult   Pharmacy Consult for heparin Indication: chest pain/ACS   Patient Measurements: Heparin Dosing Weight: 88.3 kg  Vital Signs: Temp: 98.3 F (36.8 C) (08/08 1657) Temp Source: Oral (08/08 1657) BP: 150/89 (08/08 1657) Pulse Rate: 89 (08/08 1657)   Labs: Recent Labs    06/06/18 0218 06/06/18 0715 06/08/18 0936 06/08/18 1228 06/08/18 1815  HGB  --  13.7 13.7  --   --   HCT  --  42.5 41.5  --   --   PLT  --  234 278  --   --   HEPARINUNFRC  --  0.19*  --   --  0.47  CREATININE 0.87  --  1.28*  --   --   TROPONINI  --   --   --  0.31* 0.32*    Medical History: Past Medical History:  Diagnosis Date  . Acute pulmonary edema (HCC) 06/05/2018  . Arthritis   . Diabetes mellitus without complication (HCC)   . Diabetic neuropathy [facility](HCC)      Assessment: Patient was admitted earlier this week but had to leave the hospital AMA to take care of a personal issue. He is now back seeking medical care. Planning to start heparin for ACS. He previously required heparin up to rates of 2150 units/hr and was still never therapeutic. He had a cath on 06/05/18 which showed multi-vessel disease. Planning to consult TCTS for possible CABG.   Heparin is now therapeutic on 2200 units/hr.  Goal of Therapy:  Heparin level 0.3-0.7 units/ml Monitor platelets by anticoagulation protocol: Yes    Plan:  -Continue heparin at 2200 units/hr  -Daily heparin level, CBC  Emya Picado, Pharm.D., BCPS Clinical Pharmacist Clinical phone for 06/08/2018 is [phone].  **Pharmacist phone directory can now be found on [email or web address] ([email or web address]).  [facility] under [facility].  06/08/2018 7:34 PM

## 2018-06-08 NOTE — H&P (Signed)
Referring Physician:  DONNAVIN VANDENBRINK is an 62 y.o. male.                       Chief Complaint: Chest pain  HPI: 62 year old male with Left main and 3 vessel disease has chest pain. He left AMA 2 days back for personal reason and is noe ready to stay in hospital with Surgery or high risk angioplasty. He had mild DKA last time. He is not short of breath as much and has minimal leg edema.   Past Medical History:  Diagnosis Date  . Acute pulmonary edema ([place]) 06/05/2018  . Arthritis   . Diabetes mellitus without complication ([place])   . Diabetic neuropathy [facility])       Past Surgical History:  Procedure Laterality Date  . KNEE ARTHROSCOPY    . RIGHT/LEFT HEART CATH AND CORONARY ANGIOGRAPHY N/A 06/05/2018   Procedure: RIGHT/LEFT HEART CATH AND CORONARY ANGIOGRAPHY;  Surgeon: Dixie Dials, MD;  Location: [place] CV LAB;  Service: Cardiovascular;  Laterality: N/A;  . SPINE SURGERY      Family History  Problem Relation Age of Onset  . Diabetes Mother   . Cancer Father   . Heart disease Father    Social History:  reports that he has been smoking. He has a 40.00 pack-year smoking history. He has never used smokeless tobacco. He reports that he drinks alcohol. He reports that he does not use drugs.  Allergies:  Allergies  Allergen Reactions  . Lisinopril Other (See Comments)    Syncope  . Codeine Rash     (Not in a hospital admission)  Results for orders placed or performed during the hospital encounter of 06/08/18 (from the past 48 hour(s))  CBC     Status: Abnormal   Collection Time: 06/08/18  9:36 AM  Result Value Ref Range   WBC 10.6 (H) 4.0 - 10.5 K/uL   RBC 4.51 4.22 - 5.81 MIL/uL   Hemoglobin 13.7 13.0 - 17.0 g/dL   HCT 41.5 39.0 - 52.0 %   MCV 92.0 78.0 - 100.0 fL   MCH 30.4 26.0 - 34.0 pg   MCHC 33.0 30.0 - 36.0 g/dL   RDW 12.5 11.5 - 15.5 %   Platelets 278 150 - 400 K/uL    Comment: Performed at [facility] [address], [place], [place] [address]   Comprehensive metabolic panel     Status: Abnormal   Collection Time: 06/08/18  9:36 AM  Result Value Ref Range   Sodium 134 (L) 135 - 145 mmol/L   Potassium 4.2 3.5 - 5.1 mmol/L   Chloride 102 98 - 111 mmol/L   CO2 21 (L) 22 - 32 mmol/L   Glucose, Bld 325 (H) 70 - 99 mg/dL   BUN 18 6 - 20 mg/dL   Creatinine, Ser 1.28 (H) 0.61 - 1.24 mg/dL   Calcium 8.8 (L) 8.9 - 10.3 mg/dL   Total Protein 8.0 6.5 - 8.1 g/dL   Albumin 3.4 (L) 3.5 - 5.0 g/dL   AST 74 (H) 15 - 41 U/L   ALT 70 (H) 0 - 44 U/L   Alkaline Phosphatase 69 38 - 126 U/L   Total Bilirubin 1.2 0.3 - 1.2 mg/dL   GFR calc non Af Amer 59 (L) >60 mL/min   GFR calc Af Amer >60 >60 mL/min    Comment: (NOTE) The eGFR has been calculated using the CKD EPI equation. This calculation has not  been validated in all clinical situations. eGFR's persistently <60 mL/min signify possible Chronic Kidney Disease.    Anion gap 11 5 - 15    Comment: Performed at [facility] [address] [place], [place] [address]  Lipase, blood     Status: None   Collection Time: 06/08/18  9:36 AM  Result Value Ref Range   Lipase 33 11 - 51 U/L    Comment: Performed at [facility] [address], [place], [place] [address]  I-stat troponin, ED     Status: Abnormal   Collection Time: 06/08/18  9:51 AM  Result Value Ref Range   Troponin i, poc 0.28 (HH) 0.00 - 0.08 ng/mL   Comment NOTIFIED PHYSICIAN    Comment 3            Comment: Due to the release kinetics of cTnI, a negative result within the first hours of the onset of symptoms does not rule out myocardial infarction with certainty. If myocardial infarction is still suspected, repeat the test at appropriate intervals.    Dg Chest 2 View  Result Date: 06/08/2018 CLINICAL DATA:  LEFT chest pain and abdominal pain since last night, hospitalized last week with possible MI and shortness of breath, history diabetes mellitus, smoker, pulmonary edema EXAM: CHEST - 2 VIEW COMPARISON:   06/04/2018 FINDINGS: Enlargement of cardiac silhouette with pulmonary vascular congestion. Distal contours stable. Improved interstitial edema. No new infiltrate, pleural effusion or pneumothorax. Scattered endplate spur formation thoracic spine. IMPRESSION: Improved pulmonary edema. Electronically Signed   By: Lavonia Dana M.D.   On: 06/08/2018 10:07    Review Of Systems Constitutional: No fever, chills, weight loss or gain. Eyes: Positive vision change, wears glasses. No discharge or pain. Ears: No hearing loss, No tinnitus. Respiratory: No asthma, COPD, pneumonias. Positive shortness of breath. No hemoptysis. Cardiovascular: Positive chest pain, palpitation, leg edema. Gastrointestinal: No nausea, vomiting, diarrhea, constipation. No GI bleed. No hepatitis. Genitourinary: No dysuria, hematuria, kidney stone. No incontinance. Neurological: No headache, stroke, seizures.  Psychiatry: No psych facility admission for anxiety, depression, suicide. No detox. Skin: No rash. Musculoskeletal: Positive joint pain, no fibromyalgia. No neck pain, positive back pain. Lymphadenopathy: No lymphadenopathy. Hematology: No anemia or easy bruising.   Blood pressure (!) 161/85, pulse 88, temperature 98.2 F (36.8 C), temperature source Oral, resp. rate (!) 22, SpO2 93 %. There is no height or weight on file to calculate BMI. General appearance: alert, cooperative, appears stated age and no distress Head: Normocephalic, atraumatic. Eyes: Blue eyes, pink conjunctiva, corneas clear. PERRL, EOM's intact. Neck: No adenopathy, no carotid bruit, no JVD, supple, symmetrical, trachea midline and thyroid not enlarged. Resp: Clear to auscultation bilaterally. Cardio: Regular rate and rhythm, S1, S2 normal, II/VI systolic murmur, no click, rub or gallop GI: Soft, non-tender; bowel sounds normal; no organomegaly. Extremities: Trace edema, cyanosis or clubbing. Skin: Warm and dry.  Neurologic: Alert and oriented X 3,  normal strength. Normal coordination and gait.  Assessment/Plan Acute coronary syndrome LM and 2 vesel CAD Obesity Hypertension Uncontrolled DM, II  Admit CVTS consult High risk angioplasty consult if needed  Birdie Riddle, MD  06/08/2018, 11:46 AM

## 2018-06-08 NOTE — Progress Notes (Signed)
ANTICOAGULATION CONSULT NOTE - Initial Consult   Pharmacy Consult for heparin Indication: chest pain/ACS   Patient Measurements: Heparin Dosing Weight: 88.3 kg  Vital Signs: Temp: 98.2 F (36.8 C) (08/08 0927) Temp Source: Oral (08/08 0927) BP: 161/85 (08/08 1115) Pulse Rate: 88 (08/08 1115)   Labs: Recent Labs    06/06/18 0218 06/06/18 0715 06/08/18 0936  HGB  --  13.7 13.7  HCT  --  42.5 41.5  PLT  --  234 278  HEPARINUNFRC  --  0.19*  --   CREATININE 0.87  --  1.28*    Medical History: Past Medical History:  Diagnosis Date  . Acute pulmonary edema (HCC) 06/05/2018  . Arthritis   . Diabetes mellitus without complication (HCC)   . Diabetic neuropathy [facility](HCC)      Assessment: Patient was admitted earlier this week but had to leave the hospital AMA to take care of a personal issue. He is now back seeking medical care. Planning to start heparin for ACS. He previously required heparin up to rates of 2150 units/hr and was still never therapeutic. He had a cath on 06/05/18 which showed multi-vessel disease. Planning to consult TCTS for possible CABG.    Goal of Therapy:  Heparin level 0.3-0.7 units/ml Monitor platelets by anticoagulation protocol: Yes    Plan:  -Heparin bolus 4000 units x1 then initiate heparin at 2200 units/hr based on previous level information -Daily HL, CBC -Check level in 6 hours    Jae Bruck, Darl HouseholderAlison M 06/08/2018,11:51 AM

## 2018-06-08 NOTE — ED Notes (Signed)
Lab contacted and reports adding on lipase to previous blood draw.

## 2018-06-09 ENCOUNTER — Inpatient Hospital Stay (HOSPITAL_COMMUNITY): Payer: Self-pay

## 2018-06-09 ENCOUNTER — Other Ambulatory Visit: Payer: Self-pay | Admitting: *Deleted

## 2018-06-09 ENCOUNTER — Other Ambulatory Visit: Payer: Self-pay

## 2018-06-09 DIAGNOSIS — I25110 Atherosclerotic heart disease of native coronary artery with unstable angina pectoris: Secondary | ICD-10-CM

## 2018-06-09 DIAGNOSIS — Z01810 Encounter for preprocedural cardiovascular examination: Secondary | ICD-10-CM

## 2018-06-09 DIAGNOSIS — I2510 Atherosclerotic heart disease of native coronary artery without angina pectoris: Secondary | ICD-10-CM

## 2018-06-09 LAB — BASIC METABOLIC PANEL
ANION GAP: 15 (ref 5–15)
BUN: 18 mg/dL (ref 6–20)
CALCIUM: 8.8 mg/dL — AB (ref 8.9–10.3)
CHLORIDE: 96 mmol/L — AB (ref 98–111)
CO2: 22 mmol/L (ref 22–32)
Creatinine, Ser: 1.26 mg/dL — ABNORMAL HIGH (ref 0.61–1.24)
GFR calc Af Amer: >60 mL/min (ref >60)
GFR calc non Af Amer: >60 mL/min (ref >60)
GLUCOSE: 246 mg/dL — AB (ref 70–99)
Potassium: 3.8 mmol/L (ref 3.5–5.1)
Sodium: 133 mmol/L — ABNORMAL LOW (ref 135–145)

## 2018-06-09 LAB — PULMONARY FUNCTION TEST
FEF 25-75 Pre: 2 L/sec
FEF2575-%Pred-Pre: 72 %
FEV1-%Pred-Pre: 59 %
FEV1-Pre: 1.99 L
FEV1FVC-%Pred-Pre: 103 %
FEV6-%Pred-Pre: 59 %
FEV6-Pre: 2.53 L
FEV6FVC-%Pred-Pre: 105 %
FVC-%Pred-Pre: 57 %
FVC-Pre: 2.53 L
Pre FEV1/FVC ratio: 79 %
Pre FEV6/FVC Ratio: 100 %

## 2018-06-09 LAB — CULTURE, BLOOD (ROUTINE X 2)
Culture: NO GROWTH
Culture: NO GROWTH
Special Requests: ADEQUATE
Special Requests: ADEQUATE

## 2018-06-09 LAB — GLUCOSE, CAPILLARY
Glucose-Capillary: 226 mg/dL — ABNORMAL HIGH (ref 70–99)
Glucose-Capillary: 217 mg/dL — ABNORMAL HIGH (ref 70–99)

## 2018-06-09 LAB — HEPARIN LEVEL (UNFRACTIONATED): HEPARIN UNFRACTIONATED: 0.33 [IU]/mL (ref 0.30–0.70)

## 2018-06-09 LAB — LIPID PANEL
CHOL/HDL RATIO: 7.9 ratio
Cholesterol: 166 mg/dL (ref 0–200)
HDL: 21 mg/dL — AB (ref >40)
LDL CALC: 122 mg/dL — AB (ref 0–99)
Triglycerides: 115 mg/dL (ref <150)
VLDL: 23 mg/dL (ref 0–40)

## 2018-06-09 LAB — PROTIME-INR
INR: 1.23
Prothrombin Time: 15.4 seconds — ABNORMAL HIGH (ref 11.4–15.2)

## 2018-06-09 LAB — CBC
HEMATOCRIT: 42 % (ref 39.0–52.0)
Hemoglobin: 13.7 g/dL (ref 13.0–17.0)
MCH: 29.8 pg (ref 26.0–34.0)
MCHC: 32.6 g/dL (ref 30.0–36.0)
MCV: 91.5 fL (ref 78.0–100.0)
PLATELETS: 269 10*3/uL (ref 150–400)
RBC: 4.59 MIL/uL (ref 4.22–5.81)
RDW: 12.6 % (ref 11.5–15.5)
WBC: 13.8 10*3/uL — AB (ref 4.0–10.5)

## 2018-06-09 LAB — SURGICAL PCR SCREEN
MRSA, PCR: NEGATIVE
Staphylococcus aureus: NEGATIVE

## 2018-06-09 LAB — TROPONIN I: Troponin I: 0.25 ng/mL (ref <0.03)

## 2018-06-09 MED ORDER — MOMETASONE FURO-FORMOTEROL FUM 100-5 MCG/ACT IN AERO
2.0000 | INHALATION_SPRAY | Freq: Two times a day (BID) | RESPIRATORY_TRACT | Status: DC
  Administered 2018-06-09 – 2018-06-11 (×3): 2 via RESPIRATORY_TRACT
  Filled 2018-06-09: qty 8.8

## 2018-06-09 MED ORDER — MAGNESIUM CHLORIDE 64 MG PO TBEC
1.0000 | DELAYED_RELEASE_TABLET | Freq: Every day | ORAL | Status: DC
  Administered 2018-06-09 – 2018-06-11 (×3): 64 mg via ORAL
  Filled 2018-06-09 (×4): qty 1

## 2018-06-09 MED ORDER — REGADENOSON 0.4 MG/5ML IV SOLN
INTRAVENOUS | Status: AC
  Filled 2018-06-09: qty 5

## 2018-06-09 MED ORDER — INSULIN ASPART 100 UNIT/ML ~~LOC~~ SOLN
0.0000 [IU] | Freq: Three times a day (TID) | SUBCUTANEOUS | Status: DC
  Administered 2018-06-09: 7 [IU] via SUBCUTANEOUS
  Administered 2018-06-10: 4 [IU] via SUBCUTANEOUS
  Administered 2018-06-10 (×3): 7 [IU] via SUBCUTANEOUS
  Administered 2018-06-11: 4 [IU] via SUBCUTANEOUS
  Administered 2018-06-11: 7 [IU] via SUBCUTANEOUS
  Administered 2018-06-11: 15 [IU] via SUBCUTANEOUS

## 2018-06-09 MED ORDER — POTASSIUM CHLORIDE CRYS ER 10 MEQ PO TBCR
10.0000 meq | EXTENDED_RELEASE_TABLET | Freq: Two times a day (BID) | ORAL | Status: DC
  Administered 2018-06-09 – 2018-06-11 (×6): 10 meq via ORAL
  Filled 2018-06-09 (×8): qty 1

## 2018-06-09 MED ORDER — INSULIN GLARGINE 100 UNIT/ML ~~LOC~~ SOLN
15.0000 [IU] | Freq: Every day | SUBCUTANEOUS | Status: DC
  Administered 2018-06-09 – 2018-06-11 (×3): 15 [IU] via SUBCUTANEOUS
  Filled 2018-06-09 (×3): qty 0.15

## 2018-06-09 MED ORDER — REGADENOSON 0.4 MG/5ML IV SOLN
0.4000 mg | Freq: Once | INTRAVENOUS | Status: AC
  Administered 2018-06-09: 0.4 mg via INTRAVENOUS
  Filled 2018-06-09: qty 5

## 2018-06-09 MED ORDER — TECHNETIUM TC 99M TETROFOSMIN IV KIT
30.0000 | PACK | Freq: Once | INTRAVENOUS | Status: AC | PRN
  Administered 2018-06-09: 30 via INTRAVENOUS

## 2018-06-09 MED ORDER — TECHNETIUM TC 99M TETROFOSMIN IV KIT
10.0000 | PACK | Freq: Once | INTRAVENOUS | Status: AC | PRN
  Administered 2018-06-09: 10 via INTRAVENOUS

## 2018-06-09 NOTE — Progress Notes (Signed)
Patient c/o abd pain/constipation this morning. Milk of magnesia given 2x last night. Cardiology paged for input.

## 2018-06-09 NOTE — Progress Notes (Addendum)
Pre-op Cardiac Surgery  Carotid Findings:  Findings suggest 1-39% internal carotid artery stenosis bilaterally. Vertebral arteries are patent with antegrade flow.  Upper Extremity Right Left  Brachial Pressures 114-Triphasic Triphasic- unable to obtain pressure due to IV location.  Radial Waveforms Triphasic Triphasic  Ulnar Waveforms Triphasic Triphasic  Palmar Arch (Allen's Test) Signal is unaffected with radial compression, obliterates with ulnar compression Signal is unaffected with radial compression, obliterates with ulnar compression    Lower  Extremity Right Left  Dorsalis Pedis 69-monophasic 31-monophasic  Posterior Tibial 81-monophasic 43-monophasic  Ankle/Brachial Indices 0.71 0.38    Findings:   The right ABI is suggestive of moderate arterial insufficiency at rest. The left ABI is suggestive of severe arterial insufficiency at rest.  Preliminary results discussed with Alycia Rossettiyan, RN of TCTS and Dr. Algie CofferKadakia.  06/09/2018 2:04 PM Gertie FeyMichelle Rance Smithson, MHA, RVT, RDCS, RDMS

## 2018-06-09 NOTE — Progress Notes (Signed)
Ref: Patient, No Pcp Per   Subjective:  Feeling tired. No active chest pain. VS are stable. Appreciate CVTS consult. Elevated blood sugars.  Objective:  Vital Signs in the last 24 hours: Temp:  [98.3 F (36.8 C)-98.6 F (37 C)] 98.4 F (36.9 C) (08/09 1450) Pulse Rate:  [85-96] 85 (08/09 1450) Cardiac Rhythm: Normal sinus rhythm (08/09 0720) Resp:  [14-18] 18 (08/09 0552) BP: (118-159)/(63-92) 118/63 (08/09 1450) SpO2:  [93 %-99 %] 94 % (08/09 1450) Weight:  [91.9 kg-92.9 kg] 91.9 kg (08/09 0552)  Physical Exam: BP Readings from Last 1 Encounters:  06/09/18 118/63     Wt Readings from Last 1 Encounters:  06/09/18 91.9 kg    Weight change:  Body mass index is 30.81 kg/m. HEENT: [place]/AT, Eyes-Blue, PERL, EOMI, Conjunctiva-Pink, Sclera-Non-icteric Neck: No JVD, No bruit, Trachea midline. Lungs:  Clear, Bilateral. Cardiac:  Regular rhythm, normal S1 and S2, no S3. II/VI systolic murmur. Abdomen:  Soft, non-tender. BS present. Extremities:  No edema present. No cyanosis. No clubbing.  CNS: AxOx3, Cranial nerves grossly intact, moves all 4 extremities.  Skin: Warm and dry.   Intake/Output from previous day: 08/08 0701 - 08/09 0700 In: 795.3 [P.O.:360; I.V.:435.3] Out: -     Lab Results: BMET    Component Value Date/Time   NA 133 (L) 06/09/2018 0450   NA 134 (L) 06/08/2018 0936   NA 136 06/06/2018 0218   K 3.8 06/09/2018 0450   K 4.2 06/08/2018 0936   K 3.6 06/06/2018 0218   CL 96 (L) 06/09/2018 0450   CL 102 06/08/2018 0936   CL 100 06/06/2018 0218   CO2 22 06/09/2018 0450   CO2 21 (L) 06/08/2018 0936   CO2 27 06/06/2018 0218   GLUCOSE 246 (H) 06/09/2018 0450   GLUCOSE 325 (H) 06/08/2018 0936   GLUCOSE 153 (H) 06/06/2018 0218   BUN 18 06/09/2018 0450   BUN 18 06/08/2018 0936   BUN 13 06/06/2018 0218   CREATININE 1.26 (H) 06/09/2018 0450   CREATININE 1.28 (H) 06/08/2018 0936   CREATININE 0.87 06/06/2018 0218   CREATININE 0.81 08/14/2014 1126   CREATININE  0.82 11/26/2013 1210   CALCIUM 8.8 (L) 06/09/2018 0450   CALCIUM 8.8 (L) 06/08/2018 0936   CALCIUM 8.4 (L) 06/06/2018 0218   GFRNONAA >60 06/09/2018 0450   GFRNONAA 59 (L) 06/08/2018 0936   GFRNONAA >60 06/06/2018 0218   GFRNONAA >89 08/14/2014 1126   GFRAA >60 06/09/2018 0450   GFRAA >60 06/08/2018 0936   GFRAA >60 06/06/2018 0218   GFRAA >89 08/14/2014 1126   CBC    Component Value Date/Time   WBC 13.8 (H) 06/09/2018 0450   RBC 4.59 06/09/2018 0450   HGB 13.7 06/09/2018 0450   HCT 42.0 06/09/2018 0450   PLT 269 06/09/2018 0450   MCV 91.5 06/09/2018 0450   MCH 29.8 06/09/2018 0450   MCHC 32.6 06/09/2018 0450   RDW 12.6 06/09/2018 0450   LYMPHSABS 4.8 (H) 06/03/2018 0442   MONOABS 1.3 (H) 06/03/2018 0442   EOSABS 0.3 06/03/2018 0442   BASOSABS 0.1 06/03/2018 0442   HEPATIC Function Panel Recent Labs    06/03/18 0442 06/08/18 0936  PROT 7.2 8.0   HEMOGLOBIN A1C No components found for: HGA1C,  MPG CARDIAC ENZYMES Lab Results  Component Value Date   TROPONINI 0.25 (HH) 06/09/2018   TROPONINI 0.32 (HH) 06/08/2018   TROPONINI 0.31 (HH) 06/08/2018   BNP No results for input(s): PROBNP in the last 8760 hours. TSH Recent  Labs    06/03/18 1414  TSH 0.733   CHOLESTEROL Recent Labs    06/09/18 0450  CHOL 166    Scheduled Meds: . aspirin EC  81 mg Oral Daily  . carvedilol  3.125 mg Oral BID WC  . furosemide  40 mg Oral Daily  . insulin aspart  0-20 Units Subcutaneous TID AC & HS  . metFORMIN  1,000 mg Oral BID WC  . mometasone-formoterol  2 puff Inhalation BID  . regadenoson      . spironolactone  12.5 mg Oral BID   Continuous Infusions: . heparin 2,300 Units/hr (06/09/18 1211)   PRN Meds:.acetaminophen, magnesium hydroxide, nitroGLYCERIN, ondansetron (ZOFRAN) IV  Assessment/Plan: Acute coronary syndrome LM/3 vessel CAD Obesity Type 2 DM Hypertension PVD  Add 15 units of Lantus sq q HS for sugar control. Add potassium and small dose  magnesium.   LOS: 1 day    Orpah Cobb  MD  06/09/2018, 3:14 PM

## 2018-06-09 NOTE — Consult Note (Addendum)
[address]       [place] [id number]             [phone]        Ronda Fairlyhomas E [facility] Medical Record #[id number] Date of Birth: 5/17Bessemer/1957  Referring: No ref. provider found Primary Care: Patient, No Pcp Per Primary Cardiologist:No primary care provider on file.  Chief Complaint:    Chief Complaint  Patient presents with  . Abdominal Pain  . Chest Pain    History of Present Illness:      Robert Sanchez is a 62 year old male patient with past medical history significant for Hypertension, Obesity, tobacco abuse, alcohol abuse, uncontrolled diabetes mellitus type 2 with history of DKA and chronic right leg neuropathy who presented to the emergency department with shortness of breath.  He shares that he woke up with acute shortness of breath and had significant difficulty catching his breath.  He called 911 and was placed on CPAP in route to the ED.  A chest x-ray was obtained which showed likely pulmonary edema.  He denies chest pain, nausea, or recent illness.  He reports having pneumonia about a month ago and was taking an antibiotic but he cannot recall which one.  In the emergency department he was found to be in mild DKA.  His troponin was 0.16.  He was given Lasix, Ativan and nitroglycerin with improvement in his shortness of breath.  He did have sinus tachycardia with rates into the 120s.  Troponin peaked at 0.32.  Cardiology was consulted for further work-up.  Cardiac catheterization was obtained on 06/05/2018 which revealed multivessel coronary artery disease.  He was simultaneously being treated for mild DKA with a blood glucose level of 621 with an insulin drip.  His echocardiogram showed poor LV systolic function with an EF of 20 to 25%.  TCTS was consulted at this time however the patient left AMA before we were able to do an official consultation.  The patient returns to the emergency department on 06/08/2018 with a chief complaint of abdominal pain  and chest and back pain.  We are being consulted for possible surgical revascularization.  Current Activity/ Functional Status: Patient was independent with mobility/ambulation, transfers, ADL's, IADL's.   Zubrod Score: At the time of surgery this patient's most appropriate activity status/level should be described as: []     0    Normal activity, no symptoms [x]     1    Restricted in physical strenuous activity but ambulatory, able to do out light work []     2    Ambulatory and capable of self care, unable to do work activities, up and about                 more than 50%  Of the time                            []     3    Only limited self care, in bed greater than 50% of waking hours []     4    Completely disabled, no self care, confined to bed or chair []     5    Moribund  Past Medical History:  Diagnosis Date  . Acute pulmonary edema (HCC) 06/05/2018  . Arthritis   . Diabetes mellitus without complication (HCC)   . Diabetic neuropathy [facility](HCC)     Past Surgical History:  Procedure Laterality Date  .  KNEE ARTHROSCOPY    . RIGHT/LEFT HEART CATH AND CORONARY ANGIOGRAPHY N/A 06/05/2018   Procedure: RIGHT/LEFT HEART CATH AND CORONARY ANGIOGRAPHY;  Surgeon: Orpah Cobb, MD;  Location: [facility] INVASIVE CV LAB;  Service: Cardiovascular;  Laterality: N/A;  . SPINE SURGERY      Social History   Tobacco Use  Smoking Status Current Every Day Smoker  . Packs/day: 1.00  . Years: 40.00  . Pack years: 40.00  Smokeless Tobacco Never Used    Social History   Substance and Sexual Activity  Alcohol Use Yes     Allergies  Allergen Reactions  . Lisinopril Other (See Comments)    Syncope  . Codeine Rash    Current Facility-Administered Medications  Medication Dose Route Frequency Provider Last Rate Last Dose  . acetaminophen (TYLENOL) tablet 650 mg  650 mg Oral Q4H PRN Orpah Cobb, MD      . aspirin EC tablet 81 mg  81 mg Oral Daily Orpah Cobb, MD   81 mg at 06/09/18 1100  .  carvedilol (COREG) tablet 3.125 mg  3.125 mg Oral BID WC Orpah Cobb, MD   3.125 mg at 06/09/18 1100  . furosemide (LASIX) tablet 40 mg  40 mg Oral Daily Orpah Cobb, MD   40 mg at 06/09/18 1100  . heparin ADULT infusion 100 units/mL (25000 units/23mL sodium chloride 0.45%)  2,300 Units/hr Intravenous Continuous Hammons, Kimberly B, RPH 23 mL/hr at 06/09/18 1211 2,300 Units/hr at 06/09/18 1211  . insulin aspart (novoLOG) injection 0-20 Units  0-20 Units Subcutaneous TID WC Orpah Cobb, MD   7 Units at 06/09/18 1210  . magnesium hydroxide (MILK OF MAGNESIA) suspension 30 mL  30 mL Oral Daily PRN Orpah Cobb, MD   30 mL at 06/09/18 0019  . metFORMIN (GLUCOPHAGE) tablet 1,000 mg  1,000 mg Oral BID WC Orpah Cobb, MD   1,000 mg at 06/09/18 1059  . nitroGLYCERIN (NITROSTAT) SL tablet 0.4 mg  0.4 mg Sublingual Q5 Min x 3 PRN Orpah Cobb, MD      . ondansetron (ZOFRAN) injection 4 mg  4 mg Intravenous Q6H PRN Orpah Cobb, MD      . regadenoson (LEXISCAN) 0.4 MG/5ML injection SOLN           . spironolactone (ALDACTONE) tablet 12.5 mg  12.5 mg Oral BID Orpah Cobb, MD   12.5 mg at 06/09/18 1100    Medications Prior to Admission  Medication Sig Dispense Refill Last Dose  . metFORMIN (GLUCOPHAGE) 1000 MG tablet Take 1 tablet (1,000 mg total) by mouth 2 (two) times daily with a meal. 180 tablet 1 unk    Family History  Problem Relation Age of Onset  . Diabetes Mother   . Cancer Father   . Heart disease Father      Review of Systems:   Review of Systems  Constitutional: Negative for diaphoresis and fever.  HENT: Negative.   Eyes: Negative for blurred vision and double vision.  Respiratory: Negative for cough, shortness of breath and wheezing.   Cardiovascular: Positive for chest pain.  Gastrointestinal: Positive for abdominal pain, constipation, nausea and vomiting. Negative for diarrhea.  Neurological: Negative.    Pertinent items are noted in HPI.     Physical Exam: BP  130/73   Pulse 85   Temp 98.3 F (36.8 C)   Resp 18   Ht 5\' 8"  (1.727 m)   Wt 91.9 kg   SpO2 98%   BMI 30.81 kg/m    General appearance: alert,  cooperative and no distress Resp: clear to auscultation bilaterally Cardio: regular rate and rhythm, S1, S2 normal, no murmur, click, rub or gallop GI: soft, non-tender; bowel sounds normal; no masses,  no organomegaly Extremities: extremities normal, atraumatic, no cyanosis or edema Neurologic: Grossly normal     Cardiac Cath 06/05/2018  Conclusion:  Prox RCA to Mid RCA lesion is 100% stenosed.  Prox Cx lesion is 100% stenosed.  Dist LM lesion is 50% stenosed.  Ost LAD to Prox LAD lesion is 70% stenosed.  2nd Mrg lesion is 80% stenosed.  Ost RPDA to RPDA lesion is 80% stenosed.  Dist RCA lesion is 80% stenosed.  Post Atrio lesion is 100% stenosed.  Dist LAD lesion is 70% stenosed.  LV end diastolic pressure is normal.   CVTS consult for CABG post viability test. Lifestyle modification.   Echocardiogram 8/5   Result status: Final result                              *[place]*                   *[facility]*                         [address]                        [place], [place] [address]                            [phone]  ------------------------------------------------------------------- Transthoracic Echocardiography  Patient:    Robert Sanchez, Robert Sanchez MR #:       [id number] Study Date: 06/05/2018 Gender:     M Age:        62 Height:     172.7 cm Weight:     96.6 kg BSA:        2.18 m^2 Pt. Status: Room:       5W05C   Winfred Burn, MD  PERFORMING   Orpah Cobb, MD  REFERRING    Orpah Cobb, MD  ATTENDING    Geoffery Lyons [id number]  ADMITTING    Elray Mcgregor B  SONOGRAPHER  Thurman Coyer  cc:  ------------------------------------------------------------------- LV EF: 20% -    25%  ------------------------------------------------------------------- Indications:      Acute respiratory distress 518.82.  ------------------------------------------------------------------- History:   Risk factors:  Current tobacco use. Diabetes mellitus.   ------------------------------------------------------------------- Study Conclusions  - Left ventricle: The cavity size was mildly dilated. There was   mild concentric hypertrophy. Systolic function was severely   reduced. The estimated ejection fraction was in the range of 20%   to 25%. Diffuse hypokinesis. Doppler parameters are consistent   with abnormal left ventricular relaxation (grade 1 diastolic   dysfunction). - Aortic valve: There was trivial regurgitation. - Mitral valve: There was moderate regurgitation. - Left atrium: The atrium was mildly dilated.     Recent Radiology Findings:   Dg Chest 2 View  Result Date: 06/08/2018 CLINICAL DATA:  LEFT chest pain and abdominal pain since last night, hospitalized last week with possible MI and shortness of breath, history diabetes mellitus, smoker, pulmonary edema EXAM: CHEST - 2 VIEW COMPARISON:  06/04/2018 FINDINGS: Enlargement of cardiac silhouette with pulmonary vascular congestion. Distal contours  stable. Improved interstitial edema. No new infiltrate, pleural effusion or pneumothorax. Scattered endplate spur formation thoracic spine. IMPRESSION: Improved pulmonary edema. Electronically Signed   By: Ulyses Southward M.D.   On: 06/08/2018 10:07     I have independently reviewed the above radiologic studies and discussed with the patient   Recent Lab Findings: Lab Results  Component Value Date   WBC 13.8 (H) 06/09/2018   HGB 13.7 06/09/2018   HCT 42.0 06/09/2018   PLT 269 06/09/2018   GLUCOSE 246 (H) 06/09/2018   CHOL 166 06/09/2018   TRIG 115 06/09/2018   HDL 21 (L) 06/09/2018   LDLCALC 122 (H) 06/09/2018   ALT 70 (H) 06/08/2018   AST 74 (H) 06/08/2018   NA 133  (L) 06/09/2018   K 3.8 06/09/2018   CL 96 (L) 06/09/2018   CREATININE 1.26 (H) 06/09/2018   BUN 18 06/09/2018   CO2 22 06/09/2018   TSH 0.733 06/03/2018   INR 1.23 06/09/2018   HGBA1C 12.4 (H) 06/03/2018      Assessment / Plan:   1. Acute Coronary Syndrome-Continue Heparin gtt, no current chest pain. Continue ASA and Coreg . Plan for cardiac MRI viability study to help with decision regarding operability 2. Combined systolic and diastolic congestive heart failure- EF is 20-25% on recent echocardiogram. There is moderate MR.  f mag PRN, patient is requesting prune juice  Plan:review results of cardiac MRI viability- patient undergoing eval for high risk cabg CABG  I  spent 40 minutes counseling the patient face to face.   Jari Favre, PA-C  06/09/2018 12:24 PM Agree with above assesment by Margaretha Glassing, PA Will review cardiac MRI, pre cabg carotid dopplers and CT chest to decide if patient would benefit from CABG Kerin Perna MD

## 2018-06-09 NOTE — Progress Notes (Signed)
ANTICOAGULATION CONSULT NOTE - Follow Up Consult   Pharmacy Consult for heparin Indication: chest pain/ACS   Patient Measurements: Heparin Dosing Weight: 88.3 kg  Vital Signs: Temp: 98.3 F (36.8 C) (08/09 0552) BP: 130/73 (08/09 0942) Pulse Rate: 85 (08/09 0552)   Labs: Recent Labs    06/08/18 0936 06/08/18 1228 06/08/18 1815 06/09/18 0004 06/09/18 0450  HGB 13.7  --   --   --  13.7  HCT 41.5  --   --   --  42.0  PLT 278  --   --   --  269  LABPROT  --   --   --   --  15.4*  INR  --   --   --   --  1.23  HEPARINUNFRC  --   --  0.47  --  0.33  CREATININE 1.28*  --   --   --  1.26*  TROPONINI  --  0.31* 0.32* 0.25*  --     Medical History: Past Medical History:  Diagnosis Date  . Acute pulmonary edema (HCC) 06/05/2018  . Arthritis   . Diabetes mellitus without complication (HCC)   . Diabetic neuropathy [facility]HCC)      Assessment: Patient was admitted earlier this week but had to leave the hospital AMA to take care of a personal issue. He is now back seeking medical care. Restarted on heparin for ACS. He previously required heparin up to rates of 2150 units/hr and was still never therapeutic. He had a cath on 06/05/18 which showed multi-vessel disease. Planning to consult TCTS for possible CABG.   Heparin is therapeutic on 2200 units/hr but at lower end of goal.  Goal of Therapy:  Heparin level 0.3-0.7 units/ml Monitor platelets by anticoagulation protocol: Yes    Plan:  -Increase heparin to 2300 units/hr  -Daily heparin level, CBC  Rees Santistevan, Pharm.D., BCPS Clinical Pharmacist Clinical phone for 06/09/2018 is [phone].  **Pharmacist phone directory can now be found on [email or web address] ([email or web address]).  Listed under [facility].  06/09/2018 11:54 AM

## 2018-06-10 LAB — CBC
HCT: 41.7 % (ref 39.0–52.0)
HEMOGLOBIN: 13.9 g/dL (ref 13.0–17.0)
MCH: 30.3 pg (ref 26.0–34.0)
MCHC: 33.3 g/dL (ref 30.0–36.0)
MCV: 91 fL (ref 78.0–100.0)
Platelets: 302 10*3/uL (ref 150–400)
RBC: 4.58 MIL/uL (ref 4.22–5.81)
RDW: 12.7 % (ref 11.5–15.5)
WBC: 13.7 10*3/uL — ABNORMAL HIGH (ref 4.0–10.5)

## 2018-06-10 LAB — GLUCOSE, CAPILLARY
GLUCOSE-CAPILLARY: 218 mg/dL — AB (ref 70–99)
Glucose-Capillary: 217 mg/dL — ABNORMAL HIGH (ref 70–99)
Glucose-Capillary: 214 mg/dL — ABNORMAL HIGH (ref 70–99)
Glucose-Capillary: 221 mg/dL — ABNORMAL HIGH (ref 70–99)
Glucose-Capillary: 171 mg/dL — ABNORMAL HIGH (ref 70–99)

## 2018-06-10 LAB — LIPID PANEL
Cholesterol: 148 mg/dL (ref 0–200)
HDL: 24 mg/dL — ABNORMAL LOW (ref >40)
LDL Cholesterol: 100 mg/dL — ABNORMAL HIGH (ref 0–99)
Total CHOL/HDL Ratio: 6.2 RATIO
Triglycerides: 119 mg/dL (ref <150)
VLDL: 24 mg/dL (ref 0–40)

## 2018-06-10 LAB — TSH: TSH: 3.719 u[IU]/mL (ref 0.350–4.500)

## 2018-06-10 LAB — PROTIME-INR
INR: 1.14
Prothrombin Time: 14.5 seconds (ref 11.4–15.2)

## 2018-06-10 LAB — HEPARIN LEVEL (UNFRACTIONATED): Heparin Unfractionated: 0.38 IU/mL (ref 0.30–0.70)

## 2018-06-10 MED ORDER — ATORVASTATIN CALCIUM 40 MG PO TABS
40.0000 mg | ORAL_TABLET | Freq: Every day | ORAL | Status: DC
  Administered 2018-06-10 – 2018-06-11 (×2): 40 mg via ORAL
  Filled 2018-06-10 (×2): qty 1

## 2018-06-10 MED ORDER — LEVALBUTEROL HCL 1.25 MG/0.5ML IN NEBU
1.2500 mg | INHALATION_SOLUTION | Freq: Four times a day (QID) | RESPIRATORY_TRACT | Status: DC
  Administered 2018-06-10 – 2018-06-11 (×4): 1.25 mg via RESPIRATORY_TRACT
  Filled 2018-06-10 (×5): qty 0.5

## 2018-06-10 MED ORDER — SORBITOL 70 % SOLN: 30.0000 mL | Freq: Every morning | Status: DC

## 2018-06-10 MED ORDER — SORBITOL 70 % SOLN
60.0000 mL | Freq: Every morning | Status: AC
  Administered 2018-06-11: 60 mL via ORAL
  Filled 2018-06-10: qty 60

## 2018-06-10 MED ORDER — POLYETHYLENE GLYCOL 3350 17 G PO PACK
17.0000 g | PACK | Freq: Every day | ORAL | Status: DC | PRN
  Administered 2018-06-10: 17 g via ORAL
  Filled 2018-06-10: qty 1

## 2018-06-10 NOTE — Plan of Care (Signed)

## 2018-06-10 NOTE — Progress Notes (Signed)
Procedure(s) (LRB): CORONARY ARTERY BYPASS GRAFTING (CABG) x 3 (N/A) TRANSESOPHAGEAL ECHOCARDIOGRAM (TEE) (N/A) Subjective: Patient stable on IV heparin Sinus rhythm Blood sugars better controlled  Patient did not receive cardiac MRI viability study but with anterior wall function only mildly reduced and with significant left main disease and total RCA and total circumflex he will need surgical coronary revascularization.  Plan surgery on Monday, August 12.  I discussed the procedure of CABG in detail with the patient including the expected benefits expected recovery and potential risks including stroke death and infection.  Patient has significant peripheral vascular disease with left leg ABI 0.3 with a traumatic claudication.  No significant carotid disease.  Pulmonary mechanics adequate for sternotomy.  Chest CT scan shows moderate COPD but no suspicious at risk pulmonary mass or mediastinal adenopathy.  Objective: Vital signs in last 24 hours: Temp:  [97.6 F (36.4 C)-98.8 F (37.1 C)] 97.6 F (36.4 C) (08/10 1313) Pulse Rate:  [83-89] 83 (08/10 1313) Cardiac Rhythm: Normal sinus rhythm (08/10 0700) Resp:  [18-20] 20 (08/10 0545) BP: (129-136)/(67-91) 136/91 (08/10 1313) SpO2:  [90 %-97 %] 93 % (08/10 1313) Weight:  [86.2 kg] 86.2 kg (08/10 0545)  Hemodynamic parameters for last 24 hours:    Intake/Output from previous day: 08/09 0701 - 08/10 0700 In: 205.2 [I.V.:205.2] Out: -  Intake/Output this shift: Total I/O In: 526 [P.O.:480; I.V.:46] Out: -   Alert and comfortable Lungs clear No groin hematoma No peripheral edema  Lab Results: Recent Labs    06/09/18 0450 06/10/18 0342  WBC 13.8* 13.7*  HGB 13.7 13.9  HCT 42.0 41.7  PLT 269 302   BMET:  Recent Labs    06/08/18 0936 06/09/18 0450  NA 134* 133*  K 4.2 3.8  CL 102 96*  CO2 21* 22  GLUCOSE 325* 246*  BUN 18 18  CREATININE 1.28* 1.26*  CALCIUM 8.8* 8.8*    PT/INR:  Recent Labs     06/10/18 0342  LABPROT 14.5  INR 1.14   ABG    Component Value Date/Time   PHART 7.417 06/05/2018 1613   HCO3 23.4 06/05/2018 1613   TCO2 24 06/05/2018 1613   ACIDBASEDEF 1.0 06/05/2018 1613   O2SAT 95.0 06/05/2018 1613   CBG (last 3)  Recent Labs    06/09/18 2154 06/10/18 0725 06/10/18 1107  GLUCAP 217* 214* 218*    Assessment/Plan: S/P Procedure(s) (LRB): CORONARY ARTERY BYPASS GRAFTING (CABG) x 3 (N/A) TRANSESOPHAGEAL ECHOCARDIOGRAM (TEE) (N/A) Patient scheduled for CABG a.m. of August 12   LOS: 2 days    Kathlee Nationseter Van Trigt III 06/10/2018

## 2018-06-10 NOTE — Progress Notes (Signed)
ANTICOAGULATION CONSULT NOTE - Follow Up Consult   Pharmacy Consult for heparin Indication: chest pain/ACS   Patient Measurements: Heparin Dosing Weight: 88.3 kg  Vital Signs: Temp: 97.6 F (36.4 C) (08/10 1313) Temp Source: Oral (08/10 1313) BP: 136/91 (08/10 1313) Pulse Rate: 83 (08/10 1313)   Labs: Recent Labs    06/08/18 0936 06/08/18 1228 06/08/18 1815 06/09/18 0004 06/09/18 0450 06/10/18 0342  HGB 13.7  --   --   --  13.7 13.9  HCT 41.5  --   --   --  42.0 41.7  PLT 278  --   --   --  269 302  LABPROT  --   --   --   --  15.4* 14.5  INR  --   --   --   --  1.23 1.14  HEPARINUNFRC  --   --  0.47  --  0.33 0.38  CREATININE 1.28*  --   --   --  1.26*  --   TROPONINI  --  0.31* 0.32* 0.25*  --   --     Medical History: Past Medical History:  Diagnosis Date  . Acute pulmonary edema (HCC) 06/05/2018  . Arthritis   . Diabetes mellitus without complication (HCC)   . Diabetic neuropathy [facility](HCC)      Assessment: Patient was admitted earlier this week but had to leave the hospital AMA to take care of a personal issue. He is now back seeking medical care. Restarted on heparin for ACS. He previously required heparin up to rates of 2150 units/hr and was still never therapeutic. He had a cath on 06/05/18 which showed multi-vessel disease. Planning to consult TCTS for possible CABG.   Heparin is therapeutic on 2300 units/hr  Goal of Therapy:  Heparin level 0.3-0.7 units/ml Monitor platelets by anticoagulation protocol: Yes    Plan:  -Continue heparin at 2300 units/hr  -Daily heparin level, CBC  Kemaria Dedic, Pharm.D., BCPS Clinical Pharmacist Clinical phone for 06/10/2018 is [phone].  **Pharmacist phone directory can now be found on [email or web address] ([email or web address]).  Listed under [facility].  06/10/2018 1:29 PM

## 2018-06-10 NOTE — Progress Notes (Signed)
Subjective:  Denies chest pain or shortness of breath.complains of constipation and lower abdominal pain otherwise feels okay.  Objective:  Vital Signs in the last 24 hours: Temp:  [98.1 F (36.7 C)-98.8 F (37.1 C)] 98.1 F (36.7 C) (08/10 0545) Pulse Rate:  [84-89] 84 (08/10 0545) Resp:  [18-20] 20 (08/10 0545) BP: (118-129)/(63-76) 129/76 (08/10 0545) SpO2:  [90 %-97 %] 90 % (08/10 0915) Weight:  [86.2 kg] 86.2 kg (08/10 0545)  Intake/Output from previous day: 08/09 0701 - 08/10 0700 In: 205.2 [I.V.:205.2] Out: -  Intake/Output from this shift: Total I/O In: 286 [P.O.:240; I.V.:46] Out: -   Physical Exam: Neck: no adenopathy, no carotid bruit, no JVD and supple, symmetrical, trachea midline Lungs: clear to auscultation bilaterally Heart: regular rate and rhythm, S1, S2 normal and 2/6 systolic murmur noted no S3 gallop. Abdomen: soft, non-tender; bowel sounds normal; no masses,  no organomegaly Extremities: extremities normal, atraumatic, no cyanosis or edema  Lab Results: Recent Labs    06/09/18 0450 06/10/18 0342  WBC 13.8* 13.7*  HGB 13.7 13.9  PLT 269 302   Recent Labs    06/08/18 0936 06/09/18 0450  NA 134* 133*  K 4.2 3.8  CL 102 96*  CO2 21* 22  GLUCOSE 325* 246*  BUN 18 18  CREATININE 1.28* 1.26*   Recent Labs    06/08/18 1815 06/09/18 0004  TROPONINI 0.32* 0.25*   Hepatic Function Panel Recent Labs    06/08/18 0936  PROT 8.0  ALBUMIN 3.4*  AST 74*  ALT 70*  ALKPHOS 69  BILITOT 1.2   Recent Labs    06/10/18 0342  CHOL 148   No results for input(s): PROTIME in the last 72 hours.  Imaging: Imaging results have been reviewed and Ct Chest Without Contrast  Result Date: 06/09/2018 CLINICAL DATA:  Chest pain, shortness of breath. Patient admitted for CHF. Acute coronary syndrome. EXAM: CT CHEST WITHOUT CONTRAST TECHNIQUE: Multidetector CT imaging of the chest was performed following the standard protocol without IV contrast.  COMPARISON:  No prior CT. Chest x-rays 06/08/2018 dating back to 06/03/2018. FINDINGS: Cardiovascular: Heart mildly enlarged. Mild mitral annular calcifications. Moderate aortic annular calcification. Severe three-vessel coronary atherosclerosis. Very small pericardial effusion. Mild to moderate atherosclerosis involving the thoracic and proximal abdominal aorta without evidence of aneurysm. Mediastinum/Nodes: Numerous normal sized lymph nodes throughout the mediastinum. No pathologically enlarged mediastinal, hilar or axillary lymph nodes. No mediastinal masses. Normal-appearing esophagus. Normal-appearing thyroid gland. Lungs/Pleura: Emphysematous changes throughout both lungs. Resolution of previously identified pulmonary edema. In the LATERAL RIGHT LOWER LOBE adjacent to the major fissure there is a 6 x 4 mm nodule (5 mm mean diameter) on image 78 of series 8. Scattered smaller nodules are present in the RIGHT MIDDLE LOBE (image 78), RIGHT LOWER LOBE (image 87) and LEFT UPPER LOBE (image 78). Central airways patent with mild bronchial wall thickening. No pleural effusions. Upper Abdomen: Large stool burden in the visualized colon. Benign simple cyst arising from the mid LEFT kidney. Visualized upper abdomen otherwise unremarkable for the unenhanced technique. Musculoskeletal: Degenerative disc disease and spondylosis throughout the thoracic spine. No acute findings. IMPRESSION: 1. Resolution of previously identified pulmonary edema. No acute cardiopulmonary disease currently. 2. Multiple small BILATERAL lung nodules, the largest measuring approximately 5 mm in the LATERAL RIGHT LOWER LOBE adjacent to the major fissure. No follow-up needed if patient is low-risk (and has no known or suspected primary neoplasm). Non-contrast chest CT can be considered in 12 months if patient is  high-risk. This recommendation follows the consensus statement: Guidelines for Management of Incidental Pulmonary Nodules Detected on CT  Images: From the Fleischner Society 2017; Radiology 2017; 284:228-243. 3. Mild cardiomegaly.  Severe three-vessel coronary atherosclerosis. Aortic Atherosclerosis (ICD10-I70.0) and Emphysema (ICD10-J43.9). Electronically Signed   By: Hulan Saashomas  Lawrence M.D.   On: 06/09/2018 16:14   Nm Myocar Multi W/spect W/wall Motion / Ef  Result Date: 06/09/2018 CLINICAL DATA:  62 year old male with chest pain EXAM: MYOCARDIAL IMAGING WITH SPECT (REST AND PHARMACOLOGIC-STRESS) GATED LEFT VENTRICULAR WALL MOTION STUDY LEFT VENTRICULAR EJECTION FRACTION TECHNIQUE: Standard myocardial SPECT imaging was performed after resting intravenous injection of 10 mCi Tc-2271m tetrofosmin. Subsequently, intravenous infusion of Lexiscan was performed under the supervision of the Cardiology staff. At peak effect of the drug, 30 mCi Tc-771m tetrofosmin was injected intravenously and standard myocardial SPECT imaging was performed. Quantitative gated imaging was also performed to evaluate left ventricular wall motion, and estimate left ventricular ejection fraction. COMPARISON:  None. FINDINGS: Stress EKG: ST depression noted. Perfusion: Moderate region is severe decreased counts within the apical mid and basilar segment of the inferior wall which are fixed on rest and stress. No evidence reversible ischemia. Wall Motion: LEFT ventricular dilatation. Inferior wall hypokinesia. Left Ventricular Ejection Fraction: 29 % End diastolic volume 142 ml End systolic volume 101 ml IMPRESSION: 1. Inferior wall infarction.  No evidence reversible ischemia. 2. LEFT ventricular dilatation and inferior wall hypokinesia. 3. Left ventricular ejection fraction 29% 4. Non invasive risk stratification*: High (predominately based on low ejection fraction). *2012 Appropriate Use Criteria for Coronary Revascularization Focused Update: J Am Coll Cardiol. 2012;59(9):857-881. http://content.dementiazones.comonlinejacc.org/article.aspx?articleid=1201161 Electronically Signed   By: Genevive BiStewart   Edmunds M.D.   On: 06/09/2018 15:32    Cardiac Studies:  Assessment/Plan:  Acute coronary syndrome LM/3 vessel CAD Obesity Type 2 DM Hypertension PVD Constipation Plan Rx for constipation Workup for possible evaluation for CABG in progress. Awaiting cardiac MRI to look for viability.  LOS: 2 days    Rinaldo CloudHarwani, Jasson Siegmann 06/10/2018, 10:27 AM

## 2018-06-11 LAB — GLUCOSE, CAPILLARY
Glucose-Capillary: 200 mg/dL — ABNORMAL HIGH (ref 70–99)
Glucose-Capillary: 244 mg/dL — ABNORMAL HIGH (ref 70–99)
Glucose-Capillary: 310 mg/dL — ABNORMAL HIGH (ref 70–99)
Glucose-Capillary: 128 mg/dL — ABNORMAL HIGH (ref 70–99)
Glucose-Capillary: 217 mg/dL — ABNORMAL HIGH (ref 70–99)

## 2018-06-11 LAB — COMPREHENSIVE METABOLIC PANEL
ALT: 59 U/L — ABNORMAL HIGH (ref 0–44)
AST: 36 U/L (ref 15–41)
Albumin: 3.1 g/dL — ABNORMAL LOW (ref 3.5–5.0)
Alkaline Phosphatase: 58 U/L (ref 38–126)
Anion gap: 12 (ref 5–15)
BUN: 22 mg/dL — ABNORMAL HIGH (ref 6–20)
CO2: 23 mmol/L (ref 22–32)
Calcium: 8.8 mg/dL — ABNORMAL LOW (ref 8.9–10.3)
Chloride: 94 mmol/L — ABNORMAL LOW (ref 98–111)
Creatinine, Ser: 1.57 mg/dL — ABNORMAL HIGH (ref 0.61–1.24)
GFR calc Af Amer: 54 mL/min — ABNORMAL LOW (ref >60)
GFR calc non Af Amer: 46 mL/min — ABNORMAL LOW (ref >60)
Glucose, Bld: 177 mg/dL — ABNORMAL HIGH (ref 70–99)
Potassium: 4.5 mmol/L (ref 3.5–5.1)
Sodium: 129 mmol/L — ABNORMAL LOW (ref 135–145)
Total Bilirubin: 0.9 mg/dL (ref 0.3–1.2)
Total Protein: 7.7 g/dL (ref 6.5–8.1)

## 2018-06-11 LAB — CBC
HEMATOCRIT: 43.6 % (ref 39.0–52.0)
HEMOGLOBIN: 14.2 g/dL (ref 13.0–17.0)
MCH: 30.1 pg (ref 26.0–34.0)
MCHC: 32.6 g/dL (ref 30.0–36.0)
MCV: 92.4 fL (ref 78.0–100.0)
Platelets: 375 10*3/uL (ref 150–400)
RBC: 4.72 MIL/uL (ref 4.22–5.81)
RDW: 12.7 % (ref 11.5–15.5)
WBC: 15.4 10*3/uL — ABNORMAL HIGH (ref 4.0–10.5)

## 2018-06-11 LAB — PREPARE RBC (CROSSMATCH)

## 2018-06-11 LAB — ABO/RH: ABO/RH(D): O NEG

## 2018-06-11 LAB — HEPARIN LEVEL (UNFRACTIONATED): HEPARIN UNFRACTIONATED: 0.35 [IU]/mL (ref 0.30–0.70)

## 2018-06-11 MED ORDER — MAGNESIUM SULFATE 50 % IJ SOLN
40.0000 meq | INTRAMUSCULAR | Status: DC
  Filled 2018-06-11: qty 9.85

## 2018-06-11 MED ORDER — BISACODYL 5 MG PO TBEC: 5.0000 mg | DELAYED_RELEASE_TABLET | Freq: Once | ORAL | Status: DC

## 2018-06-11 MED ORDER — POTASSIUM CHLORIDE 2 MEQ/ML IV SOLN
80.0000 meq | INTRAVENOUS | Status: DC
  Filled 2018-06-11: qty 40

## 2018-06-11 MED ORDER — VANCOMYCIN HCL 10 G IV SOLR
1250.0000 mg | INTRAVENOUS | Status: AC
  Administered 2018-06-12: 1250 mg via INTRAVENOUS
  Filled 2018-06-11: qty 1250

## 2018-06-11 MED ORDER — SODIUM CHLORIDE 0.9 % IV SOLN: INTRAVENOUS | Status: AC | PRN

## 2018-06-11 MED ORDER — PLASMA-LYTE 148 IV SOLN
INTRAVENOUS | Status: DC
  Filled 2018-06-11: qty 2.5

## 2018-06-11 MED ORDER — MILRINONE LACTATE IN DEXTROSE 20-5 MG/100ML-% IV SOLN
0.1250 ug/kg/min | INTRAVENOUS | Status: DC
  Filled 2018-06-11: qty 100

## 2018-06-11 MED ORDER — TEMAZEPAM 15 MG PO CAPS
15.0000 mg | ORAL_CAPSULE | Freq: Once | ORAL | Status: AC | PRN
  Administered 2018-06-11: 15 mg via ORAL
  Filled 2018-06-11: qty 1

## 2018-06-11 MED ORDER — NITROGLYCERIN IN D5W 200-5 MCG/ML-% IV SOLN
2.0000 ug/min | INTRAVENOUS | Status: DC
  Filled 2018-06-11: qty 250

## 2018-06-11 MED ORDER — DOPAMINE-DEXTROSE 3.2-5 MG/ML-% IV SOLN
0.0000 ug/kg/min | INTRAVENOUS | Status: DC
  Filled 2018-06-11: qty 250

## 2018-06-11 MED ORDER — MILRINONE LACTATE IN DEXTROSE 20-5 MG/100ML-% IV SOLN
0.1250 ug/kg/min | INTRAVENOUS | Status: DC
  Administered 2018-06-11: 0.125 ug/kg/min via INTRAVENOUS
  Filled 2018-06-11 (×2): qty 100

## 2018-06-11 MED ORDER — SODIUM CHLORIDE 0.9 % IV SOLN
750.0000 mg | INTRAVENOUS | Status: AC
  Administered 2018-06-12: 1.5 g via INTRAVENOUS
  Filled 2018-06-11: qty 750

## 2018-06-11 MED ORDER — ALPRAZOLAM 0.25 MG PO TABS: 0.2500 mg | ORAL_TABLET | ORAL | Status: DC | PRN

## 2018-06-11 MED ORDER — SODIUM CHLORIDE 0.9 % IV SOLN
30.0000 ug/min | INTRAVENOUS | Status: DC
  Filled 2018-06-11: qty 2

## 2018-06-11 MED ORDER — DIAZEPAM 5 MG PO TABS
5.0000 mg | ORAL_TABLET | Freq: Once | ORAL | Status: AC
  Administered 2018-06-12: 5 mg via ORAL
  Filled 2018-06-11: qty 1

## 2018-06-11 MED ORDER — CHLORHEXIDINE GLUCONATE 0.12 % MT SOLN
15.0000 mL | Freq: Once | OROMUCOSAL | Status: AC
  Administered 2018-06-12: 15 mL via OROMUCOSAL
  Filled 2018-06-11: qty 15

## 2018-06-11 MED ORDER — TRANEXAMIC ACID (OHS) BOLUS VIA INFUSION
15.0000 mg/kg | INTRAVENOUS | Status: AC
  Administered 2018-06-12: 1375.5 mg via INTRAVENOUS
  Filled 2018-06-11: qty 1376

## 2018-06-11 MED ORDER — TRANEXAMIC ACID (OHS) PUMP PRIME SOLUTION
2.0000 mg/kg | INTRAVENOUS | Status: DC
Start: 2018-06-12 — End: 2018-06-12
  Filled 2018-06-11: qty 1.83

## 2018-06-11 MED ORDER — SODIUM CHLORIDE 0.9 % IV SOLN
INTRAVENOUS | Status: DC
  Filled 2018-06-11: qty 30

## 2018-06-11 MED ORDER — SODIUM CHLORIDE 0.9 % IV SOLN
INTRAVENOUS | Status: DC
  Filled 2018-06-11 (×2): qty 1

## 2018-06-11 MED ORDER — EPINEPHRINE PF 1 MG/ML IJ SOLN
0.0000 ug/min | INTRAVENOUS | Status: DC
  Filled 2018-06-11: qty 4

## 2018-06-11 MED ORDER — CHLORHEXIDINE GLUCONATE 4 % EX LIQD
60.0000 mL | Freq: Once | CUTANEOUS | Status: AC
  Administered 2018-06-12: 4 via TOPICAL
  Filled 2018-06-11: qty 60

## 2018-06-11 MED ORDER — CHLORHEXIDINE GLUCONATE 4 % EX LIQD
60.0000 mL | Freq: Once | CUTANEOUS | Status: AC
  Administered 2018-06-11: 4 via TOPICAL
  Filled 2018-06-11: qty 60

## 2018-06-11 MED ORDER — ~~LOC~~ CARDIAC SURGERY, PATIENT & FAMILY EDUCATION
Freq: Once | Status: AC
  Administered 2018-06-11: 22:00:00
  Filled 2018-06-11: qty 1

## 2018-06-11 MED ORDER — DEXMEDETOMIDINE HCL IN NACL 400 MCG/100ML IV SOLN
0.1000 ug/kg/h | INTRAVENOUS | Status: DC
  Filled 2018-06-11: qty 100

## 2018-06-11 MED ORDER — TRANEXAMIC ACID 1000 MG/10ML IV SOLN
1.5000 mg/kg/h | INTRAVENOUS | Status: AC
  Administered 2018-06-12: 1.5 mg/kg/h via INTRAVENOUS
  Filled 2018-06-11: qty 25

## 2018-06-11 MED ORDER — METOPROLOL TARTRATE 12.5 MG HALF TABLET
12.5000 mg | ORAL_TABLET | Freq: Once | ORAL | Status: AC
  Administered 2018-06-12: 12.5 mg via ORAL
  Filled 2018-06-11: qty 1

## 2018-06-11 MED ORDER — SODIUM CHLORIDE 0.9 % IV SOLN
1.5000 g | INTRAVENOUS | Status: AC
  Administered 2018-06-12: 1.5 g via INTRAVENOUS
  Filled 2018-06-11: qty 1.5

## 2018-06-11 NOTE — Progress Notes (Signed)
Subjective:  Patient denies any chest pain or shortness of breath. Scheduled for CABG in a.m.denies PND orthopnea leg swelling. Denies palpitations.  Objective:  Vital Signs in the last 24 hours: Temp:  [97.6 F (36.4 C)-98.7 F (37.1 C)] 98.7 F (37.1 C) (08/11 0359) Pulse Rate:  [82-90] 90 (08/11 0918) Resp:  [19] 19 (08/11 0359) BP: (118-141)/(58-91) 141/89 (08/11 0918) SpO2:  [93 %-99 %] 96 % (08/11 0743) Weight:  [91.7 kg] 91.7 kg (08/11 0356)  Intake/Output from previous day: 08/10 0701 - 08/11 0700 In: 1065 [P.O.:821; I.V.:244] Out: -  Intake/Output from this shift: No intake/output data recorded.  Physical Exam: Neck: no adenopathy, no carotid bruit, no JVD and supple, symmetrical, trachea midline Lungs: clear to auscultation bilaterally Heart: regular rate and rhythm, S1, S2 normal and 2/6 systolic murmur noted Abdomen: soft, non-tender; bowel sounds normal; no masses,  no organomegaly Extremities: extremities normal, atraumatic, no cyanosis or edema  Lab Results: Recent Labs    06/10/18 0342 06/11/18 0413  WBC 13.7* 15.4*  HGB 13.9 14.2  PLT 302 375   Recent Labs    06/09/18 0450 06/11/18 0413  NA 133* 129*  K 3.8 4.5  CL 96* 94*  CO2 22 23  GLUCOSE 246* 177*  BUN 18 22*  CREATININE 1.26* 1.57*   Recent Labs    06/08/18 1815 06/09/18 0004  TROPONINI 0.32* 0.25*   Hepatic Function Panel Recent Labs    06/11/18 0413  PROT 7.7  ALBUMIN 3.1*  AST 36  ALT 59*  ALKPHOS 58  BILITOT 0.9   Recent Labs    06/10/18 0342  CHOL 148   No results for input(s): PROTIME in the last 72 hours.  Imaging: Imaging results have been reviewed and Ct Chest Without Contrast  Result Date: 06/09/2018 CLINICAL DATA:  Chest pain, shortness of breath. Patient admitted for CHF. Acute coronary syndrome. EXAM: CT CHEST WITHOUT CONTRAST TECHNIQUE: Multidetector CT imaging of the chest was performed following the standard protocol without IV contrast. COMPARISON:  No  prior CT. Chest x-rays 06/08/2018 dating back to 06/03/2018. FINDINGS: Cardiovascular: Heart mildly enlarged. Mild mitral annular calcifications. Moderate aortic annular calcification. Severe three-vessel coronary atherosclerosis. Very small pericardial effusion. Mild to moderate atherosclerosis involving the thoracic and proximal abdominal aorta without evidence of aneurysm. Mediastinum/Nodes: Numerous normal sized lymph nodes throughout the mediastinum. No pathologically enlarged mediastinal, hilar or axillary lymph nodes. No mediastinal masses. Normal-appearing esophagus. Normal-appearing thyroid gland. Lungs/Pleura: Emphysematous changes throughout both lungs. Resolution of previously identified pulmonary edema. In the LATERAL RIGHT LOWER LOBE adjacent to the major fissure there is a 6 x 4 mm nodule (5 mm mean diameter) on image 78 of series 8. Scattered smaller nodules are present in the RIGHT MIDDLE LOBE (image 78), RIGHT LOWER LOBE (image 87) and LEFT UPPER LOBE (image 78). Central airways patent with mild bronchial wall thickening. No pleural effusions. Upper Abdomen: Large stool burden in the visualized colon. Benign simple cyst arising from the mid LEFT kidney. Visualized upper abdomen otherwise unremarkable for the unenhanced technique. Musculoskeletal: Degenerative disc disease and spondylosis throughout the thoracic spine. No acute findings. IMPRESSION: 1. Resolution of previously identified pulmonary edema. No acute cardiopulmonary disease currently. 2. Multiple small BILATERAL lung nodules, the largest measuring approximately 5 mm in the LATERAL RIGHT LOWER LOBE adjacent to the major fissure. No follow-up needed if patient is low-risk (and has no known or suspected primary neoplasm). Non-contrast chest CT can be considered in 12 months if patient is high-risk. This recommendation  follows the consensus statement: Guidelines for Management of Incidental Pulmonary Nodules Detected on CT Images: From the  Fleischner Society 2017; Radiology 2017; 284:228-243. 3. Mild cardiomegaly.  Severe three-vessel coronary atherosclerosis. Aortic Atherosclerosis (ICD10-I70.0) and Emphysema (ICD10-J43.9). Electronically Signed   By: Hulan Saas M.D.   On: 06/09/2018 16:14    Cardiac Studies:  Assessment/Plan:  Acute coronary syndrome LM/3 vessel CAD Obesity Type 2 DM Hypertension PVD Acute renal injury probably secondary to prerenal azotemia Prerenal azotemia Hyponatremia Plan Hold Lasix today Give IV normal saline 50 mL per hour for 10 hours. Check labs in a.m. Scheduled for CABG in a.m. Understands the risk and benefits and agrees to proceed  LOS: 3 days    Rinaldo Cloud 06/11/2018, 10:57 AM

## 2018-06-11 NOTE — Progress Notes (Addendum)
Attempted to show pt education video on CABG.  Patient education network not available at present.  Will order pt education book and attempt video at later time.

## 2018-06-11 NOTE — Progress Notes (Signed)
Procedure(s) (LRB): CORONARY ARTERY BYPASS GRAFTING (CABG) x 3 (N/A) TRANSESOPHAGEAL ECHOCARDIOGRAM (TEE) (N/A) Subjective: Glucose < 200 No chest pain ready for CABG in am - orders placed  Objective: Vital signs in last 24 hours: Temp:  [97.6 F (36.4 C)-98.7 F (37.1 C)] 98.7 F (37.1 C) (08/11 0359) Pulse Rate:  [82-90] 90 (08/11 0918) Cardiac Rhythm: Sinus bradycardia (08/11 0700) Resp:  [19] 19 (08/11 0359) BP: (118-141)/(58-91) 141/89 (08/11 0918) SpO2:  [93 %-99 %] 96 % (08/11 0743) Weight:  [91.7 kg] 91.7 kg (08/11 0356)  Hemodynamic parameters for last 24 hours:    Intake/Output from previous day: 08/10 0701 - 08/11 0700 In: 1065 [P.O.:821; I.V.:244] Out: -  Intake/Output this shift: No intake/output data recorded.  lungs clear    Lab Results: Recent Labs    06/10/18 0342 06/11/18 0413  WBC 13.7* 15.4*  HGB 13.9 14.2  HCT 41.7 43.6  PLT 302 375   BMET:  Recent Labs    06/09/18 0450 06/11/18 0413  NA 133* 129*  K 3.8 4.5  CL 96* 94*  CO2 22 23  GLUCOSE 246* 177*  BUN 18 22*  CREATININE 1.26* 1.57*  CALCIUM 8.8* 8.8*    PT/INR:  Recent Labs    06/10/18 0342  LABPROT 14.5  INR 1.14   ABG    Component Value Date/Time   PHART 7.417 06/05/2018 1613   HCO3 23.4 06/05/2018 1613   TCO2 24 06/05/2018 1613   ACIDBASEDEF 1.0 06/05/2018 1613   O2SAT 95.0 06/05/2018 1613   CBG (last 3)  Recent Labs    06/10/18 2114 06/11/18 0719 06/11/18 1135  GLUCAP 171* 200* 244*    Assessment/Plan: S/P Procedure(s) (LRB): CORONARY ARTERY BYPASS GRAFTING (CABG) x 3 (N/A) TRANSESOPHAGEAL ECHOCARDIOGRAM (TEE) (N/A) CABG in am- R leg EVH Patient understands the surgery is at increased risk because pf mod-severe LV dysfunction   LOS: 3 days    Robert Sanchez 06/11/2018

## 2018-06-11 NOTE — Progress Notes (Signed)
ANTICOAGULATION CONSULT NOTE - Follow Up Consult   Pharmacy Consult for heparin Indication: chest pain/ACS   Patient Measurements: Heparin Dosing Weight: 88.3 kg  Vital Signs: Temp: 98.7 F (37.1 C) (08/11 0359) Temp Source: Oral (08/11 0359) BP: 141/89 (08/11 0918) Pulse Rate: 90 (08/11 0918)   Labs: Recent Labs    06/08/18 1228  06/08/18 1815 06/09/18 0004  06/09/18 0450 06/10/18 0342 06/11/18 0413  HGB  --   --   --   --    < > 13.7 13.9 14.2  HCT  --   --   --   --   --  42.0 41.7 43.6  PLT  --   --   --   --   --  269 302 375  LABPROT  --   --   --   --   --  15.4* 14.5  --   INR  --   --   --   --   --  1.23 1.14  --   HEPARINUNFRC  --    < > 0.47  --   --  0.33 0.38 0.35  CREATININE  --   --   --   --   --  1.26*  --  1.57*  TROPONINI 0.31*  --  0.32* 0.25*  --   --   --   --    < > = values in this interval not displayed.    Medical History: Past Medical History:  Diagnosis Date  . Acute pulmonary edema (HCC) 06/05/2018  . Arthritis   . Diabetes mellitus without complication (HCC)   . Diabetic neuropathy [facility](HCC)      Assessment: Patient was admitted earlier this week but had to leave the hospital AMA to take care of a personal issue. He is now back seeking medical care. Restarted on heparin for ACS. He previously required heparin up to rates of 2150 units/hr and was still never therapeutic. He had a cath on 06/05/18 which showed multi-vessel disease. TCTS consult for possible CABG >> scheduled for 8/12.   Heparin is therapeutic on 2300 units/hr  Goal of Therapy:  Heparin level 0.3-0.7 units/ml Monitor platelets by anticoagulation protocol: Yes    Plan:  -Continue heparin at 2300 units/hr  -Daily heparin level, CBC  Armando Bukhari, Pharm.D., BCPS Clinical Pharmacist Clinical phone for 06/11/2018 is [phone].  **Pharmacist phone directory can now be found on [email or web address] ([email or web address]).  [facility] under [facility].  06/11/2018 12:24 PM

## 2018-06-12 ENCOUNTER — Inpatient Hospital Stay (HOSPITAL_COMMUNITY): Payer: Self-pay

## 2018-06-12 ENCOUNTER — Inpatient Hospital Stay (HOSPITAL_COMMUNITY)
Admission: EM | Disposition: A | Discharge status: Home / Self Care | Payer: Self-pay | Source: Home / Self Care | Attending: Cardiothoracic Surgery

## 2018-06-12 ENCOUNTER — Inpatient Hospital Stay (HOSPITAL_COMMUNITY): Payer: Self-pay | Admitting: Certified Registered Nurse Anesthetist

## 2018-06-12 ENCOUNTER — Encounter (HOSPITAL_COMMUNITY): Payer: Self-pay | Admitting: Certified Registered Nurse Anesthetist

## 2018-06-12 DIAGNOSIS — Z951 Presence of aortocoronary bypass graft: Secondary | ICD-10-CM

## 2018-06-12 HISTORY — PX: CORONARY ARTERY BYPASS GRAFT: SHX141

## 2018-06-12 HISTORY — PX: TEE WITHOUT CARDIOVERSION: SHX5443

## 2018-06-12 LAB — URINALYSIS, ROUTINE W REFLEX MICROSCOPIC
Bilirubin Urine: NEGATIVE
Glucose, UA: NEGATIVE mg/dL
Ketones, ur: NEGATIVE mg/dL
Leukocytes, UA: NEGATIVE
Nitrite: NEGATIVE
Protein, ur: 100 mg/dL — AB
RBC / HPF: >50 RBC/hpf — ABNORMAL HIGH (ref 0–5)
Specific Gravity, Urine: 1.017 (ref 1.005–1.030)
pH: 6 (ref 5.0–8.0)

## 2018-06-12 LAB — GLUCOSE, CAPILLARY
GLUCOSE-CAPILLARY: 162 mg/dL — AB (ref 70–99)
GLUCOSE-CAPILLARY: 133 mg/dL — AB (ref 70–99)
GLUCOSE-CAPILLARY: 124 mg/dL — AB (ref 70–99)
GLUCOSE-CAPILLARY: 119 mg/dL — AB (ref 70–99)
GLUCOSE-CAPILLARY: 115 mg/dL — AB (ref 70–99)
GLUCOSE-CAPILLARY: 129 mg/dL — AB (ref 70–99)
Glucose-Capillary: 165 mg/dL — ABNORMAL HIGH (ref 70–99)
Glucose-Capillary: 121 mg/dL — ABNORMAL HIGH (ref 70–99)
Glucose-Capillary: 139 mg/dL — ABNORMAL HIGH (ref 70–99)

## 2018-06-12 LAB — BASIC METABOLIC PANEL
Anion gap: 13 (ref 5–15)
BUN: 21 mg/dL — ABNORMAL HIGH (ref 6–20)
CHLORIDE: 98 mmol/L (ref 98–111)
CO2: 20 mmol/L — ABNORMAL LOW (ref 22–32)
Calcium: 8.6 mg/dL — ABNORMAL LOW (ref 8.9–10.3)
Creatinine, Ser: 1.4 mg/dL — ABNORMAL HIGH (ref 0.61–1.24)
GFR, EST NON AFRICAN AMERICAN: 53 mL/min — AB (ref >60)
Glucose, Bld: 159 mg/dL — ABNORMAL HIGH (ref 70–99)
Potassium: 4.4 mmol/L (ref 3.5–5.1)
SODIUM: 131 mmol/L — AB (ref 135–145)

## 2018-06-12 LAB — POCT I-STAT, CHEM 8
BUN: 22 mg/dL — AB (ref 6–20)
BUN: 18 mg/dL (ref 6–20)
BUN: 21 mg/dL — ABNORMAL HIGH (ref 6–20)
BUN: 21 mg/dL — AB (ref 6–20)
BUN: 21 mg/dL — ABNORMAL HIGH (ref 6–20)
BUN: 22 mg/dL — AB (ref 6–20)
BUN: 22 mg/dL — ABNORMAL HIGH (ref 6–20)
CALCIUM ION: 1.15 mmol/L (ref 1.15–1.40)
CHLORIDE: 100 mmol/L (ref 98–111)
CHLORIDE: 100 mmol/L (ref 98–111)
CHLORIDE: 98 mmol/L (ref 98–111)
CREATININE: 1.3 mg/dL — AB (ref 0.61–1.24)
CREATININE: 1.2 mg/dL (ref 0.61–1.24)
CREATININE: 1.2 mg/dL (ref 0.61–1.24)
CREATININE: 1.3 mg/dL — AB (ref 0.61–1.24)
Calcium, Ion: 0.99 mmol/L — ABNORMAL LOW (ref 1.15–1.40)
Calcium, Ion: 1.14 mmol/L — ABNORMAL LOW (ref 1.15–1.40)
Calcium, Ion: 1.07 mmol/L — ABNORMAL LOW (ref 1.15–1.40)
Calcium, Ion: 1.11 mmol/L — ABNORMAL LOW (ref 1.15–1.40)
Calcium, Ion: 1.07 mmol/L — ABNORMAL LOW (ref 1.15–1.40)
Calcium, Ion: 1.08 mmol/L — ABNORMAL LOW (ref 1.15–1.40)
Chloride: 102 mmol/L (ref 98–111)
Chloride: 98 mmol/L (ref 98–111)
Chloride: 96 mmol/L — ABNORMAL LOW (ref 98–111)
Chloride: 98 mmol/L (ref 98–111)
Creatinine, Ser: 1.1 mg/dL (ref 0.61–1.24)
Creatinine, Ser: 1.2 mg/dL (ref 0.61–1.24)
Creatinine, Ser: 1.1 mg/dL (ref 0.61–1.24)
GLUCOSE: 174 mg/dL — AB (ref 70–99)
GLUCOSE: 155 mg/dL — AB (ref 70–99)
Glucose, Bld: 135 mg/dL — ABNORMAL HIGH (ref 70–99)
Glucose, Bld: 152 mg/dL — ABNORMAL HIGH (ref 70–99)
Glucose, Bld: 136 mg/dL — ABNORMAL HIGH (ref 70–99)
Glucose, Bld: 143 mg/dL — ABNORMAL HIGH (ref 70–99)
Glucose, Bld: 137 mg/dL — ABNORMAL HIGH (ref 70–99)
HCT: 38 % — ABNORMAL LOW (ref 39.0–52.0)
HCT: 29 % — ABNORMAL LOW (ref 39.0–52.0)
HCT: 28 % — ABNORMAL LOW (ref 39.0–52.0)
HCT: 33 % — ABNORMAL LOW (ref 39.0–52.0)
HEMATOCRIT: 33 % — AB (ref 39.0–52.0)
HEMATOCRIT: 37 % — AB (ref 39.0–52.0)
HEMATOCRIT: 33 % — AB (ref 39.0–52.0)
HEMOGLOBIN: 11.2 g/dL — AB (ref 13.0–17.0)
HEMOGLOBIN: 12.6 g/dL — AB (ref 13.0–17.0)
HEMOGLOBIN: 11.2 g/dL — AB (ref 13.0–17.0)
HEMOGLOBIN: 11.2 g/dL — AB (ref 13.0–17.0)
Hemoglobin: 12.9 g/dL — ABNORMAL LOW (ref 13.0–17.0)
Hemoglobin: 9.9 g/dL — ABNORMAL LOW (ref 13.0–17.0)
Hemoglobin: 9.5 g/dL — ABNORMAL LOW (ref 13.0–17.0)
POTASSIUM: 5 mmol/L (ref 3.5–5.1)
POTASSIUM: 5.1 mmol/L (ref 3.5–5.1)
POTASSIUM: 5 mmol/L (ref 3.5–5.1)
POTASSIUM: 5.1 mmol/L (ref 3.5–5.1)
POTASSIUM: 6.3 mmol/L — AB (ref 3.5–5.1)
POTASSIUM: 5.3 mmol/L — AB (ref 3.5–5.1)
Potassium: 4.6 mmol/L (ref 3.5–5.1)
SODIUM: 132 mmol/L — AB (ref 135–145)
SODIUM: 133 mmol/L — AB (ref 135–145)
SODIUM: 135 mmol/L (ref 135–145)
SODIUM: 129 mmol/L — AB (ref 135–145)
Sodium: 134 mmol/L — ABNORMAL LOW (ref 135–145)
Sodium: 129 mmol/L — ABNORMAL LOW (ref 135–145)
Sodium: 132 mmol/L — ABNORMAL LOW (ref 135–145)
TCO2: 23 mmol/L (ref 22–32)
TCO2: 24 mmol/L (ref 22–32)
TCO2: 25 mmol/L (ref 22–32)
TCO2: 23 mmol/L (ref 22–32)
TCO2: 24 mmol/L (ref 22–32)
TCO2: 23 mmol/L (ref 22–32)
TCO2: 23 mmol/L (ref 22–32)

## 2018-06-12 LAB — CBC
HCT: 42.1 % (ref 39.0–52.0)
HCT: 33.5 % — ABNORMAL LOW (ref 39.0–52.0)
HCT: 32.2 % — ABNORMAL LOW (ref 39.0–52.0)
HEMOGLOBIN: 14.3 g/dL (ref 13.0–17.0)
HEMOGLOBIN: 11 g/dL — AB (ref 13.0–17.0)
Hemoglobin: 10.7 g/dL — ABNORMAL LOW (ref 13.0–17.0)
MCH: 30.6 pg (ref 26.0–34.0)
MCH: 30.5 pg (ref 26.0–34.0)
MCH: 30.7 pg (ref 26.0–34.0)
MCHC: 34 g/dL (ref 30.0–36.0)
MCHC: 32.8 g/dL (ref 30.0–36.0)
MCHC: 33.2 g/dL (ref 30.0–36.0)
MCV: 90.1 fL (ref 78.0–100.0)
MCV: 92.8 fL (ref 78.0–100.0)
MCV: 92.5 fL (ref 78.0–100.0)
PLATELETS: 347 10*3/uL (ref 150–400)
Platelets: 148 10*3/uL — ABNORMAL LOW (ref 150–400)
Platelets: 171 10*3/uL (ref 150–400)
RBC: 4.67 MIL/uL (ref 4.22–5.81)
RBC: 3.61 MIL/uL — ABNORMAL LOW (ref 4.22–5.81)
RBC: 3.48 MIL/uL — ABNORMAL LOW (ref 4.22–5.81)
RDW: 12.6 % (ref 11.5–15.5)
RDW: 12.7 % (ref 11.5–15.5)
RDW: 12.7 % (ref 11.5–15.5)
WBC: 11.8 10*3/uL — ABNORMAL HIGH (ref 4.0–10.5)
WBC: 17.2 10*3/uL — ABNORMAL HIGH (ref 4.0–10.5)
WBC: 12.6 10*3/uL — ABNORMAL HIGH (ref 4.0–10.5)

## 2018-06-12 LAB — PLATELET COUNT: Platelets: 108 10*3/uL — ABNORMAL LOW (ref 150–400)

## 2018-06-12 LAB — POCT I-STAT 3, ART BLOOD GAS (G3+)
ACID-BASE DEFICIT: 2 mmol/L (ref 0.0–2.0)
ACID-BASE DEFICIT: 4 mmol/L — AB (ref 0.0–2.0)
ACID-BASE DEFICIT: 3 mmol/L — AB (ref 0.0–2.0)
Acid-base deficit: 3 mmol/L — ABNORMAL HIGH (ref 0.0–2.0)
Acid-base deficit: 7 mmol/L — ABNORMAL HIGH (ref 0.0–2.0)
Acid-base deficit: 4 mmol/L — ABNORMAL HIGH (ref 0.0–2.0)
BICARBONATE: 23.9 mmol/L (ref 20.0–28.0)
BICARBONATE: 22.8 mmol/L (ref 20.0–28.0)
BICARBONATE: 22.9 mmol/L (ref 20.0–28.0)
BICARBONATE: 20.7 mmol/L (ref 20.0–28.0)
Bicarbonate: 18.7 mmol/L — ABNORMAL LOW (ref 20.0–28.0)
Bicarbonate: 22 mmol/L (ref 20.0–28.0)
O2 SAT: 100 %
O2 SAT: 94 %
O2 SAT: 97 %
O2 SAT: 94 %
O2 Saturation: 100 %
O2 Saturation: 100 %
PCO2 ART: 42.4 mmHg (ref 32.0–48.0)
PCO2 ART: 34.6 mmHg (ref 32.0–48.0)
PCO2 ART: 36.3 mmHg (ref 32.0–48.0)
PH ART: 7.314 — AB (ref 7.350–7.450)
PH ART: 7.339 — AB (ref 7.350–7.450)
PO2 ART: 362 mmHg — AB (ref 83.0–108.0)
PO2 ART: 67 mmHg — AB (ref 83.0–108.0)
Patient temperature: 36.4
Patient temperature: 36.3
Patient temperature: 36.7
TCO2: 25 mmol/L (ref 22–32)
TCO2: 24 mmol/L (ref 22–32)
TCO2: 24 mmol/L (ref 22–32)
TCO2: 20 mmol/L — ABNORMAL LOW (ref 22–32)
TCO2: 23 mmol/L (ref 22–32)
TCO2: 22 mmol/L (ref 22–32)
pCO2 arterial: 47.1 mmHg (ref 32.0–48.0)
pCO2 arterial: 46.7 mmHg (ref 32.0–48.0)
pCO2 arterial: 33.3 mmHg (ref 32.0–48.0)
pH, Arterial: 7.299 — ABNORMAL LOW (ref 7.350–7.450)
pH, Arterial: 7.337 — ABNORMAL LOW (ref 7.350–7.450)
pH, Arterial: 7.387 (ref 7.350–7.450)
pH, Arterial: 7.4 (ref 7.350–7.450)
pO2, Arterial: 299 mmHg — ABNORMAL HIGH (ref 83.0–108.0)
pO2, Arterial: 195 mmHg — ABNORMAL HIGH (ref 83.0–108.0)
pO2, Arterial: 71 mmHg — ABNORMAL LOW (ref 83.0–108.0)
pO2, Arterial: 90 mmHg (ref 83.0–108.0)

## 2018-06-12 LAB — HEPARIN LEVEL (UNFRACTIONATED)

## 2018-06-12 LAB — MAGNESIUM: Magnesium: 3 mg/dL — ABNORMAL HIGH (ref 1.7–2.4)

## 2018-06-12 LAB — PROTIME-INR
INR: 1.45
PROTHROMBIN TIME: 17.5 s — AB (ref 11.4–15.2)

## 2018-06-12 LAB — CREATININE, SERUM
Creatinine, Ser: 1.35 mg/dL — ABNORMAL HIGH (ref 0.61–1.24)
GFR calc Af Amer: >60 mL/min (ref >60)
GFR calc non Af Amer: 56 mL/min — ABNORMAL LOW (ref >60)

## 2018-06-12 LAB — HEMOGLOBIN AND HEMATOCRIT, BLOOD
HCT: 30.8 % — ABNORMAL LOW (ref 39.0–52.0)
Hemoglobin: 10 g/dL — ABNORMAL LOW (ref 13.0–17.0)

## 2018-06-12 LAB — POCT I-STAT 4, (NA,K, GLUC, HGB,HCT)
GLUCOSE: 158 mg/dL — AB (ref 70–99)
HEMATOCRIT: 30 % — AB (ref 39.0–52.0)
Hemoglobin: 10.2 g/dL — ABNORMAL LOW (ref 13.0–17.0)
Potassium: 4.7 mmol/L (ref 3.5–5.1)
Sodium: 133 mmol/L — ABNORMAL LOW (ref 135–145)

## 2018-06-12 LAB — HEMOGLOBIN A1C
Hgb A1c MFr Bld: 12.3 % — ABNORMAL HIGH (ref 4.8–5.6)
Mean Plasma Glucose: 306 mg/dL

## 2018-06-12 LAB — APTT
aPTT: 44 seconds — ABNORMAL HIGH (ref 24–36)
aPTT: 32 seconds (ref 24–36)

## 2018-06-12 SURGERY — CORONARY ARTERY BYPASS GRAFTING (CABG)
Anesthesia: General | Site: Chest

## 2018-06-12 MED ORDER — ACETAMINOPHEN 650 MG RE SUPP
650.0000 mg | Freq: Once | RECTAL | Status: AC
  Administered 2018-06-12: 650 mg via RECTAL

## 2018-06-12 MED ORDER — LACTATED RINGERS IV SOLN
INTRAVENOUS | Status: DC
  Administered 2018-06-12: 20 mL/h via INTRAVENOUS

## 2018-06-12 MED ORDER — SODIUM CHLORIDE 0.9 % IV SOLN
1.5000 g | Freq: Two times a day (BID) | INTRAVENOUS | Status: AC
  Administered 2018-06-12 – 2018-06-14 (×4): 1.5 g via INTRAVENOUS
  Filled 2018-06-12 (×4): qty 1.5

## 2018-06-12 MED ORDER — FENTANYL CITRATE (PF) 250 MCG/5ML IJ SOLN
INTRAMUSCULAR | Status: DC | PRN
  Administered 2018-06-12: 50 ug via INTRAVENOUS
  Administered 2018-06-12 (×2): 250 ug via INTRAVENOUS
  Administered 2018-06-12: 100 ug via INTRAVENOUS
  Administered 2018-06-12: 50 ug via INTRAVENOUS
  Administered 2018-06-12: 150 ug via INTRAVENOUS
  Administered 2018-06-12: 250 ug via INTRAVENOUS
  Administered 2018-06-12 (×3): 50 ug via INTRAVENOUS

## 2018-06-12 MED ORDER — INSULIN REGULAR BOLUS VIA INFUSION
0.0000 [IU] | Freq: Three times a day (TID) | INTRAVENOUS | Status: DC
  Filled 2018-06-12: qty 10

## 2018-06-12 MED ORDER — BISACODYL 5 MG PO TBEC
10.0000 mg | DELAYED_RELEASE_TABLET | Freq: Every day | ORAL | Status: DC
  Administered 2018-06-13 – 2018-06-15 (×3): 10 mg via ORAL
  Filled 2018-06-12 (×4): qty 2

## 2018-06-12 MED ORDER — SODIUM CHLORIDE 0.9 % IJ SOLN
INTRAMUSCULAR | Status: AC
  Filled 2018-06-12: qty 20

## 2018-06-12 MED ORDER — POTASSIUM CHLORIDE 10 MEQ/50ML IV SOLN: 10.0000 meq | INTRAVENOUS | Status: AC

## 2018-06-12 MED ORDER — VANCOMYCIN HCL IN DEXTROSE 1-5 GM/200ML-% IV SOLN
1000.0000 mg | Freq: Two times a day (BID) | INTRAVENOUS | Status: AC
  Administered 2018-06-12 – 2018-06-13 (×3): 1000 mg via INTRAVENOUS
  Filled 2018-06-12 (×3): qty 200

## 2018-06-12 MED ORDER — DEXMEDETOMIDINE HCL IN NACL 400 MCG/100ML IV SOLN
0.0000 ug/kg/h | INTRAVENOUS | Status: DC
  Filled 2018-06-12: qty 100

## 2018-06-12 MED ORDER — FAMOTIDINE IN NACL 20-0.9 MG/50ML-% IV SOLN: 20.0000 mg | Freq: Two times a day (BID) | INTRAVENOUS | Status: AC

## 2018-06-12 MED ORDER — METOCLOPRAMIDE HCL 5 MG/ML IJ SOLN
10.0000 mg | Freq: Four times a day (QID) | INTRAMUSCULAR | Status: AC
  Administered 2018-06-12 – 2018-06-18 (×20): 10 mg via INTRAVENOUS
  Filled 2018-06-12 (×20): qty 2

## 2018-06-12 MED ORDER — HEMOSTATIC AGENTS (NO CHARGE) OPTIME
TOPICAL | Status: DC | PRN
  Administered 2018-06-12: 1 via TOPICAL

## 2018-06-12 MED ORDER — SODIUM CHLORIDE 0.9% FLUSH
10.0000 mL | Freq: Two times a day (BID) | INTRAVENOUS | Status: DC
  Administered 2018-06-12 – 2018-06-18 (×8): 10 mL

## 2018-06-12 MED ORDER — DEXTROSE 5 % IV SOLN
2.0000 ug/min | INTRAVENOUS | Status: DC
  Administered 2018-06-13: 2 ug/min via INTRAVENOUS
  Filled 2018-06-12: qty 4

## 2018-06-12 MED ORDER — ATORVASTATIN CALCIUM 40 MG PO TABS
40.0000 mg | ORAL_TABLET | Freq: Every day | ORAL | Status: DC
  Administered 2018-06-13 – 2018-06-18 (×6): 40 mg via ORAL
  Filled 2018-06-12 (×6): qty 1

## 2018-06-12 MED ORDER — FENTANYL CITRATE (PF) 250 MCG/5ML IJ SOLN
INTRAMUSCULAR | Status: AC
  Filled 2018-06-12: qty 30

## 2018-06-12 MED ORDER — MAGNESIUM SULFATE 4 GM/100ML IV SOLN
INTRAVENOUS | Status: AC
  Filled 2018-06-12: qty 100

## 2018-06-12 MED ORDER — HEPARIN SODIUM (PORCINE) 1000 UNIT/ML IJ SOLN
INTRAMUSCULAR | Status: AC
  Filled 2018-06-12: qty 1

## 2018-06-12 MED ORDER — ONDANSETRON HCL 4 MG/2ML IJ SOLN: 4.0000 mg | Freq: Four times a day (QID) | INTRAMUSCULAR | Status: DC | PRN

## 2018-06-12 MED ORDER — BISACODYL 10 MG RE SUPP
10.0000 mg | Freq: Every day | RECTAL | Status: DC
  Administered 2018-06-16: 10 mg via RECTAL
  Filled 2018-06-12: qty 1

## 2018-06-12 MED ORDER — PHENYLEPHRINE HCL 10 MG/ML IJ SOLN
INTRAMUSCULAR | Status: DC | PRN
  Administered 2018-06-12 (×2): 40 ug via INTRAVENOUS

## 2018-06-12 MED ORDER — MIDAZOLAM HCL 2 MG/2ML IJ SOLN: 2.0000 mg | INTRAMUSCULAR | Status: DC | PRN

## 2018-06-12 MED ORDER — CHLORHEXIDINE GLUCONATE CLOTH 2 % EX PADS
6.0000 | MEDICATED_PAD | Freq: Every day | CUTANEOUS | Status: DC
  Administered 2018-06-12 – 2018-06-18 (×6): 6 via TOPICAL

## 2018-06-12 MED ORDER — NOREPINEPHRINE 4 MG/250ML-% IV SOLN
0.0000 ug/min | INTRAVENOUS | Status: DC
  Administered 2018-06-13: 5 ug/min via INTRAVENOUS
  Filled 2018-06-12: qty 250

## 2018-06-12 MED ORDER — ANTITHROMBIN III (HUMAN) 500 UNITS IV SOLR
500.0000 [IU] | Freq: Once | INTRAVENOUS | Status: DC
  Filled 2018-06-12: qty 10

## 2018-06-12 MED ORDER — SODIUM CHLORIDE 0.9 % IV SOLN
INTRAVENOUS | Status: DC
  Filled 2018-06-12 (×2): qty 1

## 2018-06-12 MED ORDER — ACETAMINOPHEN 160 MG/5ML PO SOLN: 1000.0000 mg | Freq: Four times a day (QID) | ORAL | Status: AC

## 2018-06-12 MED ORDER — EPHEDRINE 5 MG/ML INJ
INTRAVENOUS | Status: AC
  Filled 2018-06-12: qty 10

## 2018-06-12 MED ORDER — LIDOCAINE 2% (20 MG/ML) 5 ML SYRINGE
INTRAMUSCULAR | Status: AC
  Filled 2018-06-12: qty 5

## 2018-06-12 MED ORDER — MIDAZOLAM HCL 10 MG/2ML IJ SOLN
INTRAMUSCULAR | Status: AC
  Filled 2018-06-12: qty 2

## 2018-06-12 MED ORDER — NITROGLYCERIN IN D5W 200-5 MCG/ML-% IV SOLN: 0.0000 ug/min | INTRAVENOUS | Status: DC

## 2018-06-12 MED ORDER — SODIUM CHLORIDE 0.9% FLUSH
3.0000 mL | Freq: Two times a day (BID) | INTRAVENOUS | Status: DC
  Administered 2018-06-13 – 2018-06-17 (×5): 3 mL via INTRAVENOUS

## 2018-06-12 MED ORDER — SODIUM CHLORIDE 0.9 % IV SOLN
INTRAVENOUS | Status: DC | PRN
  Administered 2018-06-12: 13:00:00 via INTRAVENOUS

## 2018-06-12 MED ORDER — MIDAZOLAM HCL 5 MG/5ML IJ SOLN
INTRAMUSCULAR | Status: DC | PRN
  Administered 2018-06-12 (×2): 2 mg via INTRAVENOUS
  Administered 2018-06-12: 3 mg via INTRAVENOUS
  Administered 2018-06-12: 1 mg via INTRAVENOUS
  Administered 2018-06-12 (×3): 2 mg via INTRAVENOUS

## 2018-06-12 MED ORDER — METOPROLOL TARTRATE 25 MG/10 ML ORAL SUSPENSION: 12.5000 mg | Freq: Two times a day (BID) | ORAL | Status: DC

## 2018-06-12 MED ORDER — ORAL CARE MOUTH RINSE
15.0000 mL | OROMUCOSAL | Status: DC
  Administered 2018-06-12 (×2): 15 mL via OROMUCOSAL

## 2018-06-12 MED ORDER — HEPARIN SODIUM (PORCINE) 1000 UNIT/ML IJ SOLN
INTRAMUSCULAR | Status: DC | PRN
  Administered 2018-06-12: 10 mL via INTRAVENOUS
  Administered 2018-06-12: 30 mL via INTRAVENOUS
  Administered 2018-06-12: 2 mL via INTRAVENOUS

## 2018-06-12 MED ORDER — OXYCODONE HCL 5 MG PO TABS
5.0000 mg | ORAL_TABLET | ORAL | Status: DC | PRN
  Administered 2018-06-13 – 2018-06-14 (×4): 10 mg via ORAL
  Filled 2018-06-12 (×4): qty 2

## 2018-06-12 MED ORDER — MILRINONE LACTATE IN DEXTROSE 20-5 MG/100ML-% IV SOLN
0.2500 ug/kg/min | INTRAVENOUS | Status: DC
  Administered 2018-06-12 – 2018-06-14 (×4): 0.3 ug/kg/min via INTRAVENOUS
  Filled 2018-06-12 (×4): qty 100

## 2018-06-12 MED ORDER — MORPHINE SULFATE (PF) 2 MG/ML IV SOLN
2.0000 mg | INTRAVENOUS | Status: DC | PRN
  Administered 2018-06-12: 4 mg via INTRAVENOUS
  Administered 2018-06-12: 5 mg via INTRAVENOUS
  Administered 2018-06-12: 2 mg via INTRAVENOUS
  Administered 2018-06-13 (×3): 4 mg via INTRAVENOUS
  Filled 2018-06-12: qty 2
  Filled 2018-06-12: qty 3
  Filled 2018-06-12: qty 2
  Filled 2018-06-12: qty 1
  Filled 2018-06-12 (×2): qty 2

## 2018-06-12 MED ORDER — SODIUM CHLORIDE 0.9 % IV SOLN
INTRAVENOUS | Status: DC | PRN
  Administered 2018-06-12: 40 ug/min via INTRAVENOUS

## 2018-06-12 MED ORDER — MILRINONE LACTATE IN DEXTROSE 20-5 MG/100ML-% IV SOLN
0.1250 ug/kg/min | INTRAVENOUS | Status: AC
  Administered 2018-06-12: 0.25 ug/kg/min via INTRAVENOUS
  Filled 2018-06-12: qty 100

## 2018-06-12 MED ORDER — LACTATED RINGERS IV SOLN
INTRAVENOUS | Status: DC | PRN
  Administered 2018-06-12: 07:00:00 via INTRAVENOUS

## 2018-06-12 MED ORDER — CHLORHEXIDINE GLUCONATE 0.12 % MT SOLN
15.0000 mL | OROMUCOSAL | Status: AC
  Administered 2018-06-12: 15 mL via OROMUCOSAL

## 2018-06-12 MED ORDER — 0.9 % SODIUM CHLORIDE (POUR BTL) OPTIME
TOPICAL | Status: DC | PRN
  Administered 2018-06-12: 5000 mL
  Administered 2018-06-12: 1000 mL

## 2018-06-12 MED ORDER — HEPARIN SODIUM (PORCINE) 1000 UNIT/ML IJ SOLN
INTRAMUSCULAR | Status: AC
  Filled 2018-06-12: qty 2

## 2018-06-12 MED ORDER — MOMETASONE FURO-FORMOTEROL FUM 200-5 MCG/ACT IN AERO
2.0000 | INHALATION_SPRAY | Freq: Two times a day (BID) | RESPIRATORY_TRACT | Status: DC
  Administered 2018-06-14 – 2018-06-18 (×9): 2 via RESPIRATORY_TRACT
  Filled 2018-06-12 (×2): qty 8.8

## 2018-06-12 MED ORDER — PROTAMINE SULFATE 10 MG/ML IV SOLN
INTRAVENOUS | Status: AC
  Filled 2018-06-12: qty 25

## 2018-06-12 MED ORDER — PLASMA-LYTE 148 IV SOLN
INTRAVENOUS | Status: DC | PRN
  Administered 2018-06-12: 500 mL via INTRAVASCULAR

## 2018-06-12 MED ORDER — HEMOSTATIC AGENTS (NO CHARGE) OPTIME
TOPICAL | Status: DC | PRN
  Administered 2018-06-12 (×2): 1 via TOPICAL

## 2018-06-12 MED ORDER — DOPAMINE-DEXTROSE 3.2-5 MG/ML-% IV SOLN: 2.5000 ug/kg/min | INTRAVENOUS | Status: DC

## 2018-06-12 MED ORDER — LACTATED RINGERS IV SOLN
INTRAVENOUS | Status: DC | PRN
  Administered 2018-06-12 (×3): via INTRAVENOUS

## 2018-06-12 MED ORDER — SODIUM CHLORIDE 0.45 % IV SOLN: INTRAVENOUS | Status: DC | PRN

## 2018-06-12 MED ORDER — DOCUSATE SODIUM 100 MG PO CAPS
200.0000 mg | ORAL_CAPSULE | Freq: Every day | ORAL | Status: DC
  Administered 2018-06-13 – 2018-06-16 (×4): 200 mg via ORAL
  Filled 2018-06-12 (×5): qty 2

## 2018-06-12 MED ORDER — SODIUM CHLORIDE 0.9 % IV SOLN
INTRAVENOUS | Status: DC | PRN
  Administered 2018-06-12: 2.3 [IU]/h via INTRAVENOUS

## 2018-06-12 MED ORDER — VANCOMYCIN HCL IN DEXTROSE 1-5 GM/200ML-% IV SOLN: 1000.0000 mg | Freq: Once | INTRAVENOUS | Status: DC

## 2018-06-12 MED ORDER — SODIUM BICARBONATE 8.4 % IV SOLN
50.0000 meq | Freq: Once | INTRAVENOUS | Status: AC
  Administered 2018-06-12: 50 meq via INTRAVENOUS

## 2018-06-12 MED ORDER — MAGNESIUM SULFATE 4 GM/100ML IV SOLN
4.0000 g | Freq: Once | INTRAVENOUS | Status: AC
  Administered 2018-06-12: 4 g via INTRAVENOUS

## 2018-06-12 MED ORDER — SODIUM CHLORIDE 0.9 % IV SOLN
INTRAVENOUS | Status: DC | PRN
  Administered 2018-06-12: 0.2 ug/kg/h via INTRAVENOUS

## 2018-06-12 MED ORDER — PROTAMINE SULFATE 10 MG/ML IV SOLN
INTRAVENOUS | Status: DC | PRN
  Administered 2018-06-12: 10 mg via INTRAVENOUS
  Administered 2018-06-12: 310 mg via INTRAVENOUS

## 2018-06-12 MED ORDER — CHLORHEXIDINE GLUCONATE 0.12% ORAL RINSE (MEDLINE KIT)
15.0000 mL | Freq: Two times a day (BID) | OROMUCOSAL | Status: DC
  Administered 2018-06-12: 15 mL via OROMUCOSAL

## 2018-06-12 MED ORDER — EPINEPHRINE PF 1 MG/ML IJ SOLN
INTRAVENOUS | Status: DC | PRN
  Administered 2018-06-12: 3 ug/min via INTRAVENOUS

## 2018-06-12 MED ORDER — PHENYLEPHRINE HCL-NACL 20-0.9 MG/250ML-% IV SOLN: 0.0000 ug/min | INTRAVENOUS | Status: DC

## 2018-06-12 MED ORDER — SODIUM CHLORIDE 0.9 % IV SOLN: INTRAVENOUS | Status: DC | PRN

## 2018-06-12 MED ORDER — SODIUM CHLORIDE 0.9% FLUSH: 3.0000 mL | INTRAVENOUS | Status: DC | PRN

## 2018-06-12 MED ORDER — LACTATED RINGERS IV SOLN: INTRAVENOUS | Status: DC

## 2018-06-12 MED ORDER — ASPIRIN 81 MG PO CHEW: 324.0000 mg | CHEWABLE_TABLET | Freq: Every day | ORAL | Status: DC

## 2018-06-12 MED ORDER — SODIUM CHLORIDE 0.9 % IV SOLN: 250.0000 mL | INTRAVENOUS | Status: DC

## 2018-06-12 MED ORDER — PROPOFOL 10 MG/ML IV BOLUS
INTRAVENOUS | Status: AC
  Filled 2018-06-12: qty 20

## 2018-06-12 MED ORDER — TRAMADOL HCL 50 MG PO TABS
50.0000 mg | ORAL_TABLET | ORAL | Status: DC | PRN
  Administered 2018-06-13: 100 mg via ORAL
  Filled 2018-06-12: qty 2

## 2018-06-12 MED ORDER — ASPIRIN EC 325 MG PO TBEC
325.0000 mg | DELAYED_RELEASE_TABLET | Freq: Every day | ORAL | Status: DC
  Administered 2018-06-13 – 2018-06-19 (×7): 325 mg via ORAL
  Filled 2018-06-12 (×7): qty 1

## 2018-06-12 MED ORDER — PROTAMINE SULFATE 10 MG/ML IV SOLN
INTRAVENOUS | Status: AC
  Filled 2018-06-12: qty 20

## 2018-06-12 MED ORDER — PROPOFOL 10 MG/ML IV BOLUS
INTRAVENOUS | Status: DC | PRN
  Administered 2018-06-12 (×2): 50 mg via INTRAVENOUS

## 2018-06-12 MED ORDER — METOPROLOL TARTRATE 12.5 MG HALF TABLET: 12.5000 mg | ORAL_TABLET | Freq: Two times a day (BID) | ORAL | Status: DC

## 2018-06-12 MED ORDER — SODIUM CHLORIDE 0.9% FLUSH: 10.0000 mL | INTRAVENOUS | Status: DC | PRN

## 2018-06-12 MED ORDER — METOPROLOL TARTRATE 5 MG/5ML IV SOLN: 2.5000 mg | INTRAVENOUS | Status: DC | PRN

## 2018-06-12 MED ORDER — ACETAMINOPHEN 160 MG/5ML PO SOLN: 650.0000 mg | Freq: Once | ORAL | Status: AC

## 2018-06-12 MED ORDER — ROCURONIUM BROMIDE 100 MG/10ML IV SOLN
INTRAVENOUS | Status: DC | PRN
  Administered 2018-06-12: 40 mg via INTRAVENOUS
  Administered 2018-06-12: 50 mg via INTRAVENOUS
  Administered 2018-06-12: 60 mg via INTRAVENOUS
  Administered 2018-06-12: 50 mg via INTRAVENOUS

## 2018-06-12 MED ORDER — SODIUM CHLORIDE 0.9 % IV SOLN
INTRAVENOUS | Status: DC
  Administered 2018-06-12: 10 mL/h via INTRAVENOUS
  Administered 2018-06-13: 20 mL/h via INTRAVENOUS
  Administered 2018-06-15: 21:00:00 via INTRAVENOUS

## 2018-06-12 MED ORDER — PANTOPRAZOLE SODIUM 40 MG PO TBEC
40.0000 mg | DELAYED_RELEASE_TABLET | Freq: Every day | ORAL | Status: DC
  Administered 2018-06-14 – 2018-06-19 (×6): 40 mg via ORAL
  Filled 2018-06-12 (×6): qty 1

## 2018-06-12 MED ORDER — ALBUMIN HUMAN 5 % IV SOLN
INTRAVENOUS | Status: DC | PRN
  Administered 2018-06-12: 13:00:00 via INTRAVENOUS

## 2018-06-12 MED ORDER — NOREPINEPHRINE 4 MG/250ML-% IV SOLN
0.0000 ug/min | INTRAVENOUS | Status: DC
  Administered 2018-06-12: 5 ug/min via INTRAVENOUS
  Filled 2018-06-12: qty 250

## 2018-06-12 MED ORDER — EPHEDRINE SULFATE 50 MG/ML IJ SOLN
INTRAMUSCULAR | Status: DC | PRN
  Administered 2018-06-12: 10 mg via INTRAVENOUS

## 2018-06-12 MED ORDER — ACETAMINOPHEN 500 MG PO TABS
1000.0000 mg | ORAL_TABLET | Freq: Four times a day (QID) | ORAL | Status: AC
  Administered 2018-06-12 – 2018-06-17 (×13): 1000 mg via ORAL
  Filled 2018-06-12 (×14): qty 2

## 2018-06-12 MED ORDER — ALBUMIN HUMAN 5 % IV SOLN
250.0000 mL | INTRAVENOUS | Status: AC | PRN
  Administered 2018-06-12 – 2018-06-13 (×3): 250 mL via INTRAVENOUS
  Filled 2018-06-12: qty 250

## 2018-06-12 MED ORDER — LEVALBUTEROL HCL 1.25 MG/0.5ML IN NEBU: 1.2500 mg | INHALATION_SOLUTION | Freq: Three times a day (TID) | RESPIRATORY_TRACT | Status: DC

## 2018-06-12 MED ORDER — SUCCINYLCHOLINE CHLORIDE 200 MG/10ML IV SOSY
PREFILLED_SYRINGE | INTRAVENOUS | Status: AC
  Filled 2018-06-12: qty 10

## 2018-06-12 MED ORDER — LEVALBUTEROL HCL 0.63 MG/3ML IN NEBU
0.6300 mg | INHALATION_SOLUTION | Freq: Three times a day (TID) | RESPIRATORY_TRACT | Status: DC
  Administered 2018-06-12 – 2018-06-13 (×3): 0.63 mg via RESPIRATORY_TRACT
  Filled 2018-06-12 (×3): qty 3

## 2018-06-12 MED ORDER — ORAL CARE MOUTH RINSE
15.0000 mL | Freq: Two times a day (BID) | OROMUCOSAL | Status: DC
  Administered 2018-06-12 – 2018-06-18 (×6): 15 mL via OROMUCOSAL

## 2018-06-12 MED ORDER — LACTATED RINGERS IV SOLN: 500.0000 mL | Freq: Once | INTRAVENOUS | Status: DC | PRN

## 2018-06-12 SURGICAL SUPPLY — 108 items
ADAPTER CARDIO PERF ANTE/RETRO (ADAPTER) ×4 IMPLANT
ADH SKN CLS APL DERMABOND .7 (GAUZE/BANDAGES/DRESSINGS) ×2
ADPR PRFSN 84XANTGRD RTRGD (ADAPTER) ×2
BAG DECANTER FOR FLEXI CONT (MISCELLANEOUS) ×4 IMPLANT
BANDAGE ACE 4X5 VEL STRL LF (GAUZE/BANDAGES/DRESSINGS) ×4 IMPLANT
BANDAGE ACE 6X5 VEL STRL LF (GAUZE/BANDAGES/DRESSINGS) ×4 IMPLANT
BASKET HEART  (ORDER IN 25'S) (MISCELLANEOUS) ×1
BASKET HEART (ORDER IN 25'S) (MISCELLANEOUS) ×1
BASKET HEART (ORDER IN 25S) (MISCELLANEOUS) ×2 IMPLANT
BLADE CLIPPER SURG (BLADE) IMPLANT
BLADE STERNUM SYSTEM 6 (BLADE) ×4 IMPLANT
BLADE SURG 11 STRL SS (BLADE) ×2 IMPLANT
BLADE SURG 12 STRL SS (BLADE) ×4 IMPLANT
BNDG GAUZE ELAST 4 BULKY (GAUZE/BANDAGES/DRESSINGS) ×4 IMPLANT
CANISTER SUCT 3000ML PPV (MISCELLANEOUS) ×4 IMPLANT
CANNULA GUNDRY RCSP 15FR (MISCELLANEOUS) ×4 IMPLANT
CATH CPB KIT VANTRIGT (MISCELLANEOUS) ×4 IMPLANT
CATH ROBINSON RED A/P 18FR (CATHETERS) ×12 IMPLANT
CATH THORACIC 36FR RT ANG (CATHETERS) ×4 IMPLANT
CLIP VESOCCLUDE SM WIDE 24/CT (CLIP) ×2 IMPLANT
COVER SURGICAL LIGHT HANDLE (MISCELLANEOUS) ×2 IMPLANT
CRADLE DONUT ADULT HEAD (MISCELLANEOUS) ×4 IMPLANT
DERMABOND ADVANCED (GAUZE/BANDAGES/DRESSINGS) ×2
DERMABOND ADVANCED .7 DNX12 (GAUZE/BANDAGES/DRESSINGS) IMPLANT
DRAIN CHANNEL 32F RND 10.7 FF (WOUND CARE) ×4 IMPLANT
DRAPE CARDIOVASCULAR INCISE (DRAPES) ×4
DRAPE SLUSH/WARMER DISC (DRAPES) ×4 IMPLANT
DRAPE SRG 135X102X78XABS (DRAPES) ×2 IMPLANT
DRSG AQUACEL AG ADV 3.5X14 (GAUZE/BANDAGES/DRESSINGS) ×4 IMPLANT
ELECT BLADE 4.0 EZ CLEAN MEGAD (MISCELLANEOUS) ×4
ELECT BLADE 6.5 EXT (BLADE) ×4 IMPLANT
ELECT CAUTERY BLADE 6.4 (BLADE) ×4 IMPLANT
ELECT REM PT RETURN 9FT ADLT (ELECTROSURGICAL) ×8
ELECTRODE BLDE 4.0 EZ CLN MEGD (MISCELLANEOUS) ×2 IMPLANT
ELECTRODE REM PT RTRN 9FT ADLT (ELECTROSURGICAL) ×4 IMPLANT
FELT TEFLON 1X6 (MISCELLANEOUS) ×6 IMPLANT
GAUZE SPONGE 4X4 12PLY STRL (GAUZE/BANDAGES/DRESSINGS) ×8 IMPLANT
GLOVE BIO SURGEON STRL SZ 6 (GLOVE) ×10 IMPLANT
GLOVE BIO SURGEON STRL SZ 6.5 (GLOVE) ×1 IMPLANT
GLOVE BIO SURGEON STRL SZ7.5 (GLOVE) ×10 IMPLANT
GLOVE BIO SURGEONS STRL SZ 6.5 (GLOVE) ×1
GLOVE BIOGEL PI IND STRL 6 (GLOVE) IMPLANT
GLOVE BIOGEL PI IND STRL 6.5 (GLOVE) IMPLANT
GLOVE BIOGEL PI INDICATOR 6 (GLOVE) ×6
GLOVE BIOGEL PI INDICATOR 6.5 (GLOVE) ×10
GLOVE SURG SS PI 6.0 STRL IVOR (GLOVE) ×4 IMPLANT
GOWN STRL REUS W/ TWL LRG LVL3 (GOWN DISPOSABLE) ×8 IMPLANT
GOWN STRL REUS W/TWL LRG LVL3 (GOWN DISPOSABLE) ×32
HEMOSTAT POWDER SURGIFOAM 1G (HEMOSTASIS) ×6 IMPLANT
HEMOSTAT SURGICEL 2X14 (HEMOSTASIS) ×4 IMPLANT
INSERT FOGARTY XLG (MISCELLANEOUS) IMPLANT
KIT BASIN OR (CUSTOM PROCEDURE TRAY) ×4 IMPLANT
KIT SUCTION CATH 14FR (SUCTIONS) ×4 IMPLANT
KIT TURNOVER KIT B (KITS) ×4 IMPLANT
KIT VASOVIEW HEMOPRO VH 3000 (KITS) ×4 IMPLANT
LEAD PACING MYOCARDI (MISCELLANEOUS) ×4 IMPLANT
LOOP VESSEL MAXI BLUE (MISCELLANEOUS) ×2 IMPLANT
MARKER GRAFT CORONARY BYPASS (MISCELLANEOUS) ×12 IMPLANT
NS IRRIG 1000ML POUR BTL (IV SOLUTION) ×24 IMPLANT
PACK E OPEN HEART (SUTURE) ×4 IMPLANT
PACK OPEN HEART (CUSTOM PROCEDURE TRAY) ×4 IMPLANT
PAD ARMBOARD 7.5X6 YLW CONV (MISCELLANEOUS) ×8 IMPLANT
PAD ELECT DEFIB RADIOL ZOLL (MISCELLANEOUS) ×4 IMPLANT
PENCIL BUTTON HOLSTER BLD 10FT (ELECTRODE) ×4 IMPLANT
POWDER SURGICEL 3.0 GRAM (HEMOSTASIS) ×4 IMPLANT
PUNCH AORTIC ROTATE  4.5MM 8IN (MISCELLANEOUS) ×2 IMPLANT
PUNCH AORTIC ROTATE 4.0MM (MISCELLANEOUS) IMPLANT
PUNCH AORTIC ROTATE 4.5MM 8IN (MISCELLANEOUS) IMPLANT
PUNCH AORTIC ROTATE 5MM 8IN (MISCELLANEOUS) IMPLANT
SENSOR MYOCARDIAL TEMP (MISCELLANEOUS) ×2 IMPLANT
SET CARDIOPLEGIA MPS 5001102 (MISCELLANEOUS) ×2 IMPLANT
SOLUTION ANTI FOG 6CC (MISCELLANEOUS) ×2 IMPLANT
SPONGE LAP 18X18 X RAY DECT (DISPOSABLE) ×4 IMPLANT
SURGIFLO W/THROMBIN 8M KIT (HEMOSTASIS) ×4 IMPLANT
SUT BONE WAX W31G (SUTURE) ×4 IMPLANT
SUT MNCRL AB 4-0 PS2 18 (SUTURE) ×2 IMPLANT
SUT PROLENE 3 0 SH DA (SUTURE) IMPLANT
SUT PROLENE 3 0 SH1 36 (SUTURE) IMPLANT
SUT PROLENE 4 0 RB 1 (SUTURE) ×4
SUT PROLENE 4 0 SH DA (SUTURE) ×4 IMPLANT
SUT PROLENE 4-0 RB1 .5 CRCL 36 (SUTURE) ×2 IMPLANT
SUT PROLENE 5 0 C 1 36 (SUTURE) IMPLANT
SUT PROLENE 6 0 C 1 30 (SUTURE) IMPLANT
SUT PROLENE 6 0 CC (SUTURE) ×20 IMPLANT
SUT PROLENE 8 0 BV175 6 (SUTURE) ×4 IMPLANT
SUT PROLENE BLUE 7 0 (SUTURE) ×6 IMPLANT
SUT SILK  1 MH (SUTURE)
SUT SILK 1 MH (SUTURE) IMPLANT
SUT SILK 2 0 SH CR/8 (SUTURE) ×2 IMPLANT
SUT SILK 3 0 SH CR/8 (SUTURE) IMPLANT
SUT STEEL 6MS V (SUTURE) ×6 IMPLANT
SUT STEEL SZ 6 DBL 3X14 BALL (SUTURE) ×4 IMPLANT
SUT VIC AB 1 CTX 36 (SUTURE) ×8
SUT VIC AB 1 CTX36XBRD ANBCTR (SUTURE) ×4 IMPLANT
SUT VIC AB 2-0 CT1 27 (SUTURE) ×4
SUT VIC AB 2-0 CT1 TAPERPNT 27 (SUTURE) IMPLANT
SUT VIC AB 2-0 CTX 27 (SUTURE) IMPLANT
SUT VIC AB 3-0 X1 27 (SUTURE) IMPLANT
SYR 10ML LL (SYRINGE) ×2 IMPLANT
SYSTEM SAHARA CHEST DRAIN ATS (WOUND CARE) ×4 IMPLANT
TAPE CLOTH SURG 4X10 WHT LF (GAUZE/BANDAGES/DRESSINGS) ×4 IMPLANT
TAPE PAPER 2X10 WHT MICROPORE (GAUZE/BANDAGES/DRESSINGS) ×2 IMPLANT
TOWEL GREEN STERILE (TOWEL DISPOSABLE) ×4 IMPLANT
TOWEL GREEN STERILE FF (TOWEL DISPOSABLE) ×4 IMPLANT
TRAY FOLEY SLVR 16FR TEMP STAT (SET/KITS/TRAYS/PACK) ×4 IMPLANT
TUBING INSUFFLATION (TUBING) ×4 IMPLANT
UNDERPAD 30X30 (UNDERPADS AND DIAPERS) ×4 IMPLANT
WATER STERILE IRR 1000ML POUR (IV SOLUTION) ×8 IMPLANT

## 2018-06-12 NOTE — Anesthesia Preprocedure Evaluation (Addendum)
Anesthesia Evaluation  Patient identified by MRN, date of birth, ID band Patient awake    Reviewed: Allergy & Precautions, NPO status , Patient's Chart, lab work & pertinent test results, reviewed documented beta blocker date and time   Airway Mallampati: III  TM Distance: >3 FB Neck ROM: Full    Dental  (+) Missing, Edentulous Upper, Loose,    Pulmonary Current Smoker,    Pulmonary exam normal breath sounds clear to auscultation       Cardiovascular + CAD, + Past MI and +CHF  Normal cardiovascular exam Rhythm:Regular Rate:Normal  ECG: NSR, rate 84  ECHO: LV EF: 20% -   25% Left ventricle: The cavity size was mildly dilated. There was mild concentric hypertrophy. Systolic function was severely reduced. The estimated ejection fraction was in the range of 20% to 25%. Diffuse hypokinesis. Doppler parameters are consistent with abnormal left ventricular relaxation (grade 1 diastolic dysfunction). Aortic valve: There was trivial regurgitation. Mitral valve: There was moderate regurgitation. Left atrium: The atrium was mildly dilated.  CATH: 1. Inferior wall infarction.  No evidence reversible ischemia. 2. LEFT ventricular dilatation and inferior wall hypokinesia. 3. Left ventricular ejection fraction 29% 4. Non invasive risk stratification*: High (predominately based on low ejection fraction).    Neuro/Psych Diabetic neuropathy  negative psych ROS   GI/Hepatic negative GI ROS, Neg liver ROS,   Endo/Other  diabetes, Oral Hypoglycemic Agents  Renal/GU negative Renal ROS     Musculoskeletal negative musculoskeletal ROS (+)   Abdominal   Peds  Hematology negative hematology ROS (+)   Anesthesia Other Findings   Reproductive/Obstetrics                            Anesthesia Physical Anesthesia Plan  ASA: IV  Anesthesia Plan: General   Post-op Pain Management:    Induction:  Intravenous  PONV Risk Score and Plan: 1 and Treatment may vary due to age or medical condition and Midazolam  Airway Management Planned: Oral ETT  Additional Equipment: Arterial line, CVP, PA Cath, TEE and Ultrasound Guidance Line Placement  Intra-op Plan:   Post-operative Plan: Post-operative intubation/ventilation  Informed Consent: I have reviewed the patients History and Physical, chart, labs and discussed the procedure including the risks, benefits and alternatives for the proposed anesthesia with the patient or authorized representative who has indicated his/her understanding and acceptance.   Dental advisory given  Plan Discussed with: CRNA, Anesthesiologist and Surgeon  Anesthesia Plan Comments:        Anesthesia Quick Evaluation

## 2018-06-12 NOTE — Anesthesia Postprocedure Evaluation (Signed)
Anesthesia Post Note  Patient: Robert Sanchez  Procedure(s) Performed: CORONARY ARTERY BYPASS GRAFTING (CABG) x 3 WITH ENDOSCOPIC HARVESTING OF RIGHT GREATER SAPHENOUS VEIN. LIMA TO LAD. SVG TO PD. SVG TO DIAGONAL. (N/A Chest) TRANSESOPHAGEAL ECHOCARDIOGRAM (TEE) (N/A )     Patient location during evaluation: SICU Anesthesia Type: General Level of consciousness: sedated Pain management: pain level controlled Vital Signs Assessment: post-procedure vital signs reviewed and stable Respiratory status: patient remains intubated per anesthesia plan Cardiovascular status: stable Postop Assessment: no apparent nausea or vomiting Anesthetic complications: no    Last Vitals:  Vitals:   06/12/18 1851 06/12/18 1900  BP:  101/63  Pulse: 90 91  Resp: (!) 24 (!) 22  Temp: 36.9 C 36.9 C  SpO2: 94% 93%    Last Pain:  Vitals:   06/12/18 1851  TempSrc:   PainSc: 10-Worst pain ever                 Tiffancy Moger P Jo Cerone

## 2018-06-12 NOTE — Op Note (Signed)
NAME: Robert Sanchez, Robert E. MEDICAL RECORD [id number] ACCOUNT [id number] DATE OF BIRTH:03-24-1956 FACILITY: [facility] LOCATION: [facility] PHYSICIAN:PETER VAN TRIGT III, MD  OPERATIVE REPORT  DATE OF PROCEDURE:  06/12/2018  OPERATION:   1.  Coronary artery bypass grafting x3 (left internal mammary artery to left anterior descending, saphenous vein graft to diagonal, saphenous vein graft to posterolateral branch of the right coronary. 2.  Endoscopic harvest of right leg greater saphenous vein.  SURGEON:  Kerin PernaPeter Van Trigt, MD  ASSISTANT:  Boris Lownessa Conti, PA-C.  ANESTHESIA:  General by Dr. Bradley FerrisEllender.    PREOPERATIVE DIAGNOSIS:  Ischemic cardiomyopathy, non-ST elevation myocardial infarction, severe 3-vessel coronary artery disease, poorly controlled diabetes and history of smoking, peripheral vascular disease.  POSTOPERATIVE DIAGNOSIS:  Ischemic cardiomyopathy, non-ST elevation myocardial infarction, severe 3-vessel coronary artery disease, poorly controlled diabetes and history of smoking, peripheral vascular disease.  CLINICAL NOTE:  The patient is a 62 year old diabetic smoker, who presented recently to the hospital with acute respiratory failure/flash pulmonary edema, which was treated without intubation.  Echocardiogram showed EF 25%.  He subsequently underwent  coronary catheterization demonstrating chronic occlusion of the RCA, chronic occlusion of the circumflex, and a moderate left main stenosis of the proximal LAD diagonal stenosis.  Surgical coronary revascularization was recommended as his best long-term  therapy.  His preoperative studies showed significant peripheral vascular disease with a left leg ABI of 0.3.  Chest CT scan showed evidence of COPD, but no at risk pulmonary nodules, masses or adenopathy.  The patient left the hospital briefly for  personal reasons then returned and accepted the recommendation for high-risk coronary artery bypass graft surgery for treatment of his severe  coronary artery disease and severe LV dysfunction.  Prior to surgery, I discussed the details of surgery  including the use of general anesthesia and cardiopulmonary bypass, the location of the surgical incisions, and the expected postoperative hospital recovery.  He understood the potential risks to him of CABG including risks of stroke, bleeding, blood  transfusion, postoperative infection, postoperative pulmonary problems including pleural effusion, postoperative organ failure, and death.  He demonstrated his understanding and agreed to proceed with surgery under what I felt was an informed consent.  OPERATIVE FINDINGS:  Poor targets for grafting due to severe diffuse diabetic pattern of disease.  The LAD, diagonal and distal posterolateral were suboptimal but adequate targets.  The circumflex marginal was atretic and very thickened and not an  adequate target for grafting.  The saphenous vein and mammary artery conduit were satisfactory.  No blood products were required for the surgery.  DESCRIPTION OF PROCEDURE:  The patient was brought to the operating room and placed supine on the operating room table.  General anesthesia was induced under invasive hemodynamic monitoring.  A transesophageal echo probe was placed by the anesthesia  team.  The patient was prepped and draped as a sterile field.  A proper time-out was performed.  A sternal incision was made as the saphenous vein was harvested endoscopically from the right leg.  The left internal mammary artery was harvested as a  pedicle graft from its origin at the subclavian vessels.  It was a 1.5 mm vessel with good flow.  A sternal retractor was placed, and the pericardium was opened.  There was a moderate clear pericardial effusion, probably from heart failure.  The pericardium was suspended.  Pursestrings were placed in the ascending aorta and right atrium.  Heparin was  administered and ACT was documented as being therapeutic.  The patient was  then  cannulated and placed on cardiopulmonary bypass.  The coronaries were identified for grafting, and the mammary artery and vein grafts were prepared for the distal  anastomoses.  Cardioplegic cannulas were placed both antegrade aortic and retrograde coronary sinus cardioplegia.  The patient was cooled to 32 degrees, and the aortic crossclamp was applied.  One liter of cold blood cardioplegia was delivered in split  doses between the antegrade aortic and retrograde coronary sinus catheters.  There was good cardioplegic arrest and supple temperature dropped less than 14 degrees.  Cardioplegia was delivered every 20 minutes.  The distal coronary anastomoses were performed.  The first distal anastomosis was the distal posterolateral branch of the right coronary.  It was totally occluded proximally.  It was a 1.4 mm vessel.  Reverse saphenous vein was sewn end-to-side with  running 7-0 Prolene with good flow through the graft.  Cardioplegia was redosed.  The second distal anastomosis was the diagonal branch of the LAD.  There is an ostial 80% stenosis.  A reverse saphenous vein was sewn end-to-side to this 1.5 mm vessel with running 7-0 Prolene.  There was good flow through the graft.  Cardioplegia was  redosed.  The third distal anastomosis was the mid to distal third portion of the LAD.  The LAD had a proximal 80%-90% left main and ostial stenosis.  The left IMA pedicle was brought through an opening in the left lateral pericardium and was brought down onto the  LAD and sewn end-to-side with running 8-0 Prolene.  There was good flow through the anastomosis after briefly releasing the pedicle bulldog and the mammary artery.  The bulldog was reapplied and the pedicle was secured to the epicardium with 6-0  Prolenes.  Cardioplegia was redosed.  While the crossclamp was still in place, 2 proximal vein anastomoses were performed on the ascending aorta using a 4.5 mm punch and running 6-0 Prolene.  Prior to  tying down the final proximal anastomosis, air was vented from the coronaries and the left  side of the heart with the usual de-airing maneuvers as well as a dose of retrograde warm blood cardioplegia -- hot shot.  The crossclamp was removed.  The heart resumed a spontaneous rhythm.  The cardioplegia cannula was removed.  The vein grafts were deaired and opened and each had good flow, and hemostasis was documented at the proximal and distal anastomoses.  The patient was rewarmed and  reperfused.  Temporary pacing wires were applied.  The lungs reexpanded.  Ventilator was resumed.  The patient was weaned off cardiopulmonary bypass on low dose milrinone and norepinephrine.  Cardiac output and blood pressure were satisfactory.  Echo  showed improved global LV function following CABG.  Protamine was administered without adverse reaction.  There was adequate hemostasis.  The mediastinum was irrigated.  The superior pericardial fat was closed over the aorta and vein grafts.  The anterior mediastinum and left pleural chest tubes were  placed and brought out through separate incisions.  The sternum was closed with a wire.  The patient remained stable.  The pectoralis fascia was closed with a running #1 Vicryl.  Subcutaneous and skin layers were closed using running Vicryl.  The patient  returned to the ICU in stable but critical condition.  Total cardiopulmonary bypass time was 118 minutes.  TN/NUANCE  D:06/12/2018 T:06/12/2018 JOB:[id number]

## 2018-06-12 NOTE — Progress Notes (Signed)
TCTS BRIEF SICU PROGRESS NOTE  Day of Surgery  S/P Procedure(s) (LRB): CORONARY ARTERY BYPASS GRAFTING (CABG) x 3 WITH ENDOSCOPIC HARVESTING OF RIGHT GREATER SAPHENOUS VEIN. LIMA TO LAD. SVG TO PD. SVG TO DIAGONAL. (N/A) TRANSESOPHAGEAL ECHOCARDIOGRAM (TEE) (N/A)   Sedated on vent but starting to wake NSR w/ stable hemodynamics on milrinone, levophed and Epi drips, PA pressures relatively low O2 sats 100% Chest tube output low UOP adequate Labs okay  Plan: Continue routine early postop  Purcell Nailslarence H Owen, MD 06/12/2018 5:21 PM

## 2018-06-12 NOTE — Transfer of Care (Signed)
Immediate Anesthesia Transfer of Care Note  Patient: Ronda Fairlyhomas E Huntley  Procedure(s) Performed: CORONARY ARTERY BYPASS GRAFTING (CABG) x 3 WITH ENDOSCOPIC HARVESTING OF RIGHT GREATER SAPHENOUS VEIN. LIMA TO LAD. SVG TO PD. SVG TO DIAGONAL. (N/A Chest) TRANSESOPHAGEAL ECHOCARDIOGRAM (TEE) (N/A )  Patient Location: SICU  Anesthesia Type:General  Level of Consciousness: sedated and Patient remains intubated per anesthesia plan  Airway & Oxygen Therapy: Patient remains intubated per anesthesia plan and Patient placed on Ventilator (see vital sign flow sheet for setting)  Post-op Assessment: Report given to RN and Post -op Vital signs reviewed and stable  Post vital signs: Reviewed and stable  Last Vitals:  Vitals Value Taken Time  BP 100/63 06/12/2018  2:00 PM  Temp 36.4 C 06/12/2018  2:09 PM  Pulse 83 06/12/2018  2:09 PM  Resp 27 06/12/2018  2:09 PM  SpO2 94 % 06/12/2018  2:09 PM  Vitals shown include unvalidated device data.  Last Pain:  Vitals:   06/12/18 0602  TempSrc: Oral  PainSc:       Patients Stated Pain Goal: 0 (06/10/18 [id number])  Complications: No apparent anesthesia complications

## 2018-06-12 NOTE — Brief Op Note (Signed)
06/08/2018 - 06/12/2018  7:56 AM  PATIENT:  Robert Sanchez  62 y.o. male  PRE-OPERATIVE DIAGNOSIS:  non stemi myocardial infarction  POST-OPERATIVE DIAGNOSIS:  non stemi myocardial infarction CAD  PROCEDURE:  Procedure(s): CORONARY ARTERY BYPASS GRAFTING (CABG) x 3 WITH ENDOSCOPIC HARVESTING OF RIGHT GREATER SAPHENOUS VEIN. LIMA TO LAD. SVG TO PD. SVG TO DIAGONAL. (N/A) TRANSESOPHAGEAL ECHOCARDIOGRAM (TEE) (N/A)  LIMA to LAD SVG to Diag 1 SVG to PDA  SURGEON:  Surgeon(s) and Role:    Kerin Perna* Van Trigt, Peter, MD - Primary  PHYSICIAN ASSISTANT:  Jari Favreessa Conte, PA-C  ANESTHESIA:   general  EBL:  500 mL   BLOOD ADMINISTERED:none  DRAINS: ROUTINE   LOCAL MEDICATIONS USED:  NONE  SPECIMEN:  No Specimen  DISPOSITION OF SPECIMEN:  PATHOLOGY  COUNTS:  YES  TOURNIQUET:  * No tourniquets in log *  DICTATION: .Dragon Dictation  PLAN OF CARE: Admit   PATIENT DISPOSITION:  ICU - intubated and hemodynamically stable.   Delay start of Pharmacological VTE agent (>24hrs) due to surgical blood loss or risk of bleeding: yes

## 2018-06-12 NOTE — Anesthesia Procedure Notes (Signed)
Central Venous Catheter Insertion Performed by: Arta Brucessey, Reida Hem, MD, anesthesiologist Start/End8/10/2018 6:35 AM, 06/12/2018 6:45 AM Patient location: Pre-op. Preanesthetic checklist: patient identified, IV checked, risks and benefits discussed, surgical consent, monitors and equipment checked, pre-op evaluation, timeout performed and anesthesia consent Position: Trendelenburg Lidocaine 1% used for infiltration and patient sedated Hand hygiene performed  and maximum sterile barriers used  Catheter size: 8.5 Fr PA cath and Central line was placed.Sheath introducer Swan type:thermodilution PA Cath depth:48 Procedure performed using ultrasound guided technique. Ultrasound Notes:anatomy identified, needle tip was noted to be adjacent to the nerve/plexus identified, no ultrasound evidence of intravascular and/or intraneural injection and image(s) printed for medical record Attempts: 1 Following insertion, line sutured and dressing applied. Post procedure assessment: blood return through all ports, free fluid flow and no air  Patient tolerated the procedure well with no immediate complications.

## 2018-06-12 NOTE — Progress Notes (Signed)
Ref: Patient, No Pcp Per   Subjective:  Awake and extubated. On small doses of pressure support and milrinone. VS stable. Afebrile.  Objective:  Vital Signs in the last 24 hours: Temp:  [97.2 F (36.2 C)-98.4 F (36.9 C)] 98.4 F (36.9 C) (08/12 1900) Pulse Rate:  [76-93] 91 (08/12 1900) Cardiac Rhythm: Normal sinus rhythm (08/12 1845) Resp:  [12-34] 22 (08/12 1900) BP: (95-129)/(56-77) 101/63 (08/12 1900) SpO2:  [89 %-100 %] 93 % (08/12 1900) Arterial Line BP: (104-157)/(48-64) 105/52 (08/12 1900) FiO2 (%):  [40 %-70 %] 40 % (08/12 1815) Weight:  [90.2 kg] 90.2 kg (08/12 1355)  Physical Exam: BP Readings from Last 1 Encounters:  06/12/18 101/63     Wt Readings from Last 1 Encounters:  06/12/18 90.2 kg    Weight change: -1.48 kg Body mass index is 30.24 kg/m. HEENT: [place]/AT, Eyes-Blue, Conjunctiva-Pink, Sclera-Non-icteric Neck: No JVD, No bruit, Trachea midline. Lungs:  Clearing, Bilateral. Suction noise masks breath and heart sounds. Mild line scar with dressing. Cardiac:  Regular rhythm, normal S1 and S2, no S3. II/VI systolic murmur. Abdomen:  Soft, non-tender. BS present. Extremities:  No edema present. No cyanosis. No clubbing. CNS: AxOx2, Cranial nerves grossly intact, moves all 4 extremities.  Skin: Warm and dry.   Intake/Output from previous day: 08/11 0701 - 08/12 0700 In: 927.9 [P.O.:420; I.V.:507.9] Out: 100 [Urine:100]    Lab Results: BMET    Component Value Date/Time   NA 133 (L) 06/12/2018 1403   NA 132 (L) 06/12/2018 1242   NA 129 (L) 06/12/2018 1154   K 4.7 06/12/2018 1403   K 5.3 (H) 06/12/2018 1242   K 6.3 (HH) 06/12/2018 1154   CL 98 06/12/2018 1242   CL 98 06/12/2018 1154   CL 96 (L) 06/12/2018 1125   CO2 20 (L) 06/12/2018 0522   CO2 23 06/11/2018 0413   CO2 22 06/09/2018 0450   GLUCOSE 158 (H) 06/12/2018 1403   GLUCOSE 155 (H) 06/12/2018 1242   GLUCOSE 137 (H) 06/12/2018 1154   BUN 22 (H) 06/12/2018 1242   BUN 22 (H) 06/12/2018 1154    BUN 21 (H) 06/12/2018 1125   CREATININE 1.30 (H) 06/12/2018 1242   CREATININE 1.20 06/12/2018 1154   CREATININE 1.10 06/12/2018 1125   CREATININE 0.81 08/14/2014 1126   CREATININE 0.82 11/26/2013 1210   CALCIUM 8.6 (L) 06/12/2018 0522   CALCIUM 8.8 (L) 06/11/2018 0413   CALCIUM 8.8 (L) 06/09/2018 0450   GFRNONAA 53 (L) 06/12/2018 0522   GFRNONAA 46 (L) 06/11/2018 0413   GFRNONAA >60 06/09/2018 0450   GFRNONAA >89 08/14/2014 1126   GFRAA >60 06/12/2018 0522   GFRAA 54 (L) 06/11/2018 0413   GFRAA >60 06/09/2018 0450   GFRAA >89 08/14/2014 1126   CBC    Component Value Date/Time   WBC 17.2 (H) 06/12/2018 1430   RBC 3.61 (L) 06/12/2018 1430   HGB 11.0 (L) 06/12/2018 1430   HCT 33.5 (L) 06/12/2018 1430   PLT 148 (L) 06/12/2018 1430   MCV 92.8 06/12/2018 1430   MCH 30.5 06/12/2018 1430   MCHC 32.8 06/12/2018 1430   RDW 12.7 06/12/2018 1430   LYMPHSABS 4.8 (H) 06/03/2018 0442   MONOABS 1.3 (H) 06/03/2018 0442   EOSABS 0.3 06/03/2018 0442   BASOSABS 0.1 06/03/2018 0442   HEPATIC Function Panel Recent Labs    06/03/18 0442 06/08/18 0936 06/11/18 0413  PROT 7.2 8.0 7.7   HEMOGLOBIN A1C No components found for: HGA1C,  MPG CARDIAC ENZYMES  Lab Results  Component Value Date   TROPONINI 0.25 ([place]) 06/09/2018   TROPONINI 0.32 (HH) 06/08/2018   TROPONINI 0.31 (HH) 06/08/2018   BNP No results for input(s): PROBNP in the last 8760 hours. TSH Recent Labs    06/03/18 1414 06/10/18 0342  TSH 0.733 3.719   CHOLESTEROL Recent Labs    06/09/18 0450 06/10/18 0342  CHOL 166 148    Scheduled Meds: . [START ON 06/13/2018] acetaminophen  1,000 mg Oral Q6H   Or  . [START ON 06/13/2018] acetaminophen (TYLENOL) oral liquid 160 mg/5 mL  1,000 mg Per Tube Q6H  . [START ON 06/13/2018] aspirin EC  325 mg Oral Daily   Or  . [START ON 06/13/2018] aspirin  324 mg Per Tube Daily  . [START ON 06/13/2018] atorvastatin  40 mg Oral q1800  . [START ON 06/13/2018] bisacodyl  10 mg Oral  Daily   Or  . [START ON 06/13/2018] bisacodyl  10 mg Rectal Daily  . chlorhexidine gluconate (MEDLINE KIT)  15 mL Mouth Rinse BID  . Chlorhexidine Gluconate Cloth  6 each Topical Daily  . [START ON 06/13/2018] docusate sodium  200 mg Oral Daily  . insulin regular  0-10 Units Intravenous TID WC  . levalbuterol  0.63 mg Nebulization TID  . mouth rinse  15 mL Mouth Rinse 10 times per day  . metoCLOPramide (REGLAN) injection  10 mg Intravenous Q6H  . metoprolol tartrate  12.5 mg Oral BID   Or  . metoprolol tartrate  12.5 mg Per Tube BID  . mometasone-formoterol  2 puff Inhalation BID  . [START ON 06/14/2018] pantoprazole  40 mg Oral Daily  . sodium chloride flush  10-40 mL Intracatheter Q12H  . [START ON 06/13/2018] sodium chloride flush  3 mL Intravenous Q12H   Continuous Infusions: . sodium chloride 20 mL/hr at 06/12/18 1901  . [START ON 06/13/2018] sodium chloride    . sodium chloride 10 mL/hr (06/12/18 1400)  . albumin human 250 mL (06/12/18 1649)  . cefUROXime (ZINACEF)  IV Stopped (06/12/18 1537)  . dexmedetomidine (PRECEDEX) IV infusion Stopped (06/12/18 1718)  . epinephrine    . famotidine (PEPCID) IV Stopped (06/12/18 1451)  . insulin (NOVOLIN-R) infusion 3 mL/hr at 06/12/18 1901  . lactated ringers    . lactated ringers 20 mL/hr at 06/12/18 1901  . magnesium sulfate 20 mL/hr at 06/12/18 1901  . milrinone 0.3 mcg/kg/min (06/12/18 1901)  . nitroGLYCERIN    . norepinephrine (LEVOPHED) Adult infusion 4 mcg/min (06/12/18 1901)  . vancomycin 1,000 mg (06/12/18 1901)   PRN Meds:.sodium chloride, albumin human, metoprolol tartrate, midazolam, morphine injection, ondansetron (ZOFRAN) IV, oxyCODONE, sodium chloride flush, [START ON 06/13/2018] sodium chloride flush, traMADol  Assessment/Plan: CABG x 3 LM/3 Vessel CAD Type 2 DM Obesity PVD Acute renal failure Hyponatremia  Continue follow with surgery.   LOS: 4 days    Dixie Dials  MD  06/12/2018, 7:05 PM

## 2018-06-12 NOTE — Anesthesia Procedure Notes (Signed)
Procedure Name: Intubation Date/Time: 06/12/2018 7:55 AM Performed by: Carney Living, CRNA Pre-anesthesia Checklist: Patient identified, Emergency Drugs available, Suction available, Patient being monitored and Timeout performed Patient Re-evaluated:Patient Re-evaluated prior to induction Oxygen Delivery Method: Circle system utilized Preoxygenation: Pre-oxygenation with 100% oxygen Induction Type: IV induction Ventilation: Mask ventilation without difficulty and Oral airway inserted - appropriate to patient size Laryngoscope Size: Mac and 4 Grade View: Grade II Tube type: Oral Tube size: 8.0 mm Number of attempts: 1 Airway Equipment and Method: Stylet Placement Confirmation: ETT inserted through vocal cords under direct vision,  positive ETCO2 and breath sounds checked- equal and bilateral Secured at: 23 cm Tube secured with: Tape Dental Injury: Teeth and Oropharynx as per pre-operative assessment

## 2018-06-12 NOTE — Anesthesia Procedure Notes (Signed)
Arterial Line Insertion Start/End8/10/2018 7:00 AM, 06/12/2018 7:10 AM Performed by: Rogelia BogaMueller, Tarvaris P, CRNA, CRNA  Patient location: Pre-op. Preanesthetic checklist: patient identified, IV checked, risks and benefits discussed, pre-op evaluation and timeout performed Lidocaine 1% used for infiltration Left, radial was placed Catheter size: 20 G Hand hygiene performed  and maximum sterile barriers used   Attempts: 1 Procedure performed without using ultrasound guided technique. Following insertion, dressing applied and Biopatch. Post procedure assessment: normal and unchanged  Patient tolerated the procedure well with no immediate complications.

## 2018-06-12 NOTE — Progress Notes (Signed)
Pre Procedure note for inpatients:   Ronda Fairlyhomas E Temme has been scheduled for Procedure(s): CORONARY ARTERY BYPASS GRAFTING (CABG) x 3 (N/A) TRANSESOPHAGEAL ECHOCARDIOGRAM (TEE) (N/A) today. The various methods of treatment have been discussed with the patient. After consideration of the risks, benefits and treatment options the patient has consented to the planned procedure.   The patient has been seen and labs reviewed. There are no changes in the patient's condition to prevent proceeding with the planned procedure today.  Recent labs:  Lab Results  Component Value Date   WBC 11.8 (H) 06/12/2018   HGB 14.3 06/12/2018   HCT 42.1 06/12/2018   PLT 347 06/12/2018   GLUCOSE 159 (H) 06/12/2018   CHOL 148 06/10/2018   TRIG 119 06/10/2018   HDL 24 (L) 06/10/2018   LDLCALC 100 (H) 06/10/2018   ALT 59 (H) 06/11/2018   AST 36 06/11/2018   NA 131 (L) 06/12/2018   K 4.4 06/12/2018   CL 98 06/12/2018   CREATININE 1.40 (H) 06/12/2018   BUN 21 (H) 06/12/2018   CO2 20 (L) 06/12/2018   TSH 3.719 06/10/2018   INR 1.14 06/10/2018   HGBA1C 12.4 (H) 06/03/2018    Mikey BussingPeter Van Trigt III, MD 06/12/2018 7:02 AM

## 2018-06-12 NOTE — Procedures (Signed)
Extubation Procedure Note  Patient Details:   Name: Robert Sanchez DOB: [birth year] MRN: [id number]   Airway Documentation:    Vent end date: 06/12/18 Vent end time: 1842   Evaluation  O2 sats: stable throughout Complications: No apparent complications Patient did tolerate procedure well. Bilateral Breath Sounds: Rhonchi, Diminished   Yes  RT extubated patient to 6L [place]. Patient vital capacity was 1100 and NIF was -30. Patient had a positive cuff leak. No stridor noted. Patient is able to speak after extubation. RN working with patient on Facilities managerincentive spirometer.   Robert Sanchez 06/12/2018, 6:46 PM

## 2018-06-13 ENCOUNTER — Encounter (HOSPITAL_COMMUNITY): Payer: Self-pay | Admitting: Cardiothoracic Surgery

## 2018-06-13 ENCOUNTER — Inpatient Hospital Stay (HOSPITAL_COMMUNITY): Payer: Self-pay

## 2018-06-13 LAB — BLOOD GAS, ARTERIAL
Acid-base deficit: 3.4 mmol/L — ABNORMAL HIGH (ref 0.0–2.0)
Bicarbonate: 20.9 mmol/L (ref 20.0–28.0)
Drawn by: 519031
O2 Content: 8 L/min
O2 Saturation: 89.7 %
Patient temperature: 98.2
pCO2 arterial: 35.8 mmHg (ref 32.0–48.0)
pH, Arterial: 7.382 (ref 7.350–7.450)
pO2, Arterial: 58.9 mmHg — ABNORMAL LOW (ref 83.0–108.0)

## 2018-06-13 LAB — POCT I-STAT, CHEM 8
BUN: 19 mg/dL (ref 6–20)
BUN: 16 mg/dL (ref 6–20)
CALCIUM ION: 1.12 mmol/L — AB (ref 1.15–1.40)
CHLORIDE: 101 mmol/L (ref 98–111)
Calcium, Ion: 1.1 mmol/L — ABNORMAL LOW (ref 1.15–1.40)
Chloride: 97 mmol/L — ABNORMAL LOW (ref 98–111)
Creatinine, Ser: 1.3 mg/dL — ABNORMAL HIGH (ref 0.61–1.24)
Creatinine, Ser: 1.7 mg/dL — ABNORMAL HIGH (ref 0.61–1.24)
GLUCOSE: 149 mg/dL — AB (ref 70–99)
Glucose, Bld: 145 mg/dL — ABNORMAL HIGH (ref 70–99)
HCT: 31 % — ABNORMAL LOW (ref 39.0–52.0)
HCT: 36 % — ABNORMAL LOW (ref 39.0–52.0)
HEMOGLOBIN: 10.5 g/dL — AB (ref 13.0–17.0)
HEMOGLOBIN: 12.2 g/dL — AB (ref 13.0–17.0)
POTASSIUM: 4.7 mmol/L (ref 3.5–5.1)
Potassium: 4.9 mmol/L (ref 3.5–5.1)
SODIUM: 133 mmol/L — AB (ref 135–145)
Sodium: 132 mmol/L — ABNORMAL LOW (ref 135–145)
TCO2: 22 mmol/L (ref 22–32)
TCO2: 23 mmol/L (ref 22–32)

## 2018-06-13 LAB — CREATININE, SERUM
CREATININE: 1.66 mg/dL — AB (ref 0.61–1.24)
GFR, EST AFRICAN AMERICAN: 50 mL/min — AB (ref >60)
GFR, EST NON AFRICAN AMERICAN: 43 mL/min — AB (ref >60)

## 2018-06-13 LAB — GLUCOSE, CAPILLARY
GLUCOSE-CAPILLARY: 104 mg/dL — AB (ref 70–99)
GLUCOSE-CAPILLARY: 107 mg/dL — AB (ref 70–99)
GLUCOSE-CAPILLARY: 118 mg/dL — AB (ref 70–99)
GLUCOSE-CAPILLARY: 134 mg/dL — AB (ref 70–99)
GLUCOSE-CAPILLARY: 160 mg/dL — AB (ref 70–99)
GLUCOSE-CAPILLARY: 184 mg/dL — AB (ref 70–99)
Glucose-Capillary: 105 mg/dL — ABNORMAL HIGH (ref 70–99)
Glucose-Capillary: 105 mg/dL — ABNORMAL HIGH (ref 70–99)
Glucose-Capillary: 112 mg/dL — ABNORMAL HIGH (ref 70–99)
Glucose-Capillary: 115 mg/dL — ABNORMAL HIGH (ref 70–99)
Glucose-Capillary: 121 mg/dL — ABNORMAL HIGH (ref 70–99)
Glucose-Capillary: 126 mg/dL — ABNORMAL HIGH (ref 70–99)
Glucose-Capillary: 140 mg/dL — ABNORMAL HIGH (ref 70–99)
Glucose-Capillary: 156 mg/dL — ABNORMAL HIGH (ref 70–99)
Glucose-Capillary: 180 mg/dL — ABNORMAL HIGH (ref 70–99)
Glucose-Capillary: 87 mg/dL (ref 70–99)
Glucose-Capillary: 91 mg/dL (ref 70–99)
Glucose-Capillary: 94 mg/dL (ref 70–99)

## 2018-06-13 LAB — CBC
HCT: 33.9 % — ABNORMAL LOW (ref 39.0–52.0)
HCT: 35.7 % — ABNORMAL LOW (ref 39.0–52.0)
Hemoglobin: 10.9 g/dL — ABNORMAL LOW (ref 13.0–17.0)
Hemoglobin: 11.4 g/dL — ABNORMAL LOW (ref 13.0–17.0)
MCH: 29.9 pg (ref 26.0–34.0)
MCH: 30 pg (ref 26.0–34.0)
MCHC: 32.2 g/dL (ref 30.0–36.0)
MCHC: 31.9 g/dL (ref 30.0–36.0)
MCV: 92.9 fL (ref 78.0–100.0)
MCV: 93.9 fL (ref 78.0–100.0)
Platelets: 197 K/uL (ref 150–400)
Platelets: 197 K/uL (ref 150–400)
RBC: 3.65 MIL/uL — ABNORMAL LOW (ref 4.22–5.81)
RBC: 3.8 MIL/uL — ABNORMAL LOW (ref 4.22–5.81)
RDW: 12.7 % (ref 11.5–15.5)
RDW: 13 % (ref 11.5–15.5)
WBC: 15.4 K/uL — ABNORMAL HIGH (ref 4.0–10.5)
WBC: 17.3 K/uL — ABNORMAL HIGH (ref 4.0–10.5)

## 2018-06-13 LAB — POCT I-STAT 3, ART BLOOD GAS (G3+)
Acid-base deficit: 2 mmol/L (ref 0.0–2.0)
Bicarbonate: 21.8 mmol/L (ref 20.0–28.0)
O2 Saturation: 95 %
PH ART: 7.407 (ref 7.350–7.450)
TCO2: 23 mmol/L (ref 22–32)
pCO2 arterial: 34.6 mmHg (ref 32.0–48.0)
pO2, Arterial: 73 mmHg — ABNORMAL LOW (ref 83.0–108.0)

## 2018-06-13 LAB — BASIC METABOLIC PANEL
ANION GAP: 10 (ref 5–15)
BUN: 16 mg/dL (ref 6–20)
CALCIUM: 7.6 mg/dL — AB (ref 8.9–10.3)
CO2: 20 mmol/L — ABNORMAL LOW (ref 22–32)
Chloride: 104 mmol/L (ref 98–111)
Creatinine, Ser: 1.4 mg/dL — ABNORMAL HIGH (ref 0.61–1.24)
GFR, EST NON AFRICAN AMERICAN: 53 mL/min — AB (ref >60)
Glucose, Bld: 93 mg/dL (ref 70–99)
POTASSIUM: 4.6 mmol/L (ref 3.5–5.1)
SODIUM: 134 mmol/L — AB (ref 135–145)

## 2018-06-13 LAB — COOXEMETRY PANEL
Carboxyhemoglobin: 1.1 % (ref 0.5–1.5)
Methemoglobin: 1.9 % — ABNORMAL HIGH (ref 0.0–1.5)
O2 Saturation: 52.9 %
Total hemoglobin: 10.3 g/dL — ABNORMAL LOW (ref 12.0–16.0)

## 2018-06-13 LAB — MAGNESIUM
MAGNESIUM: 2.7 mg/dL — AB (ref 1.7–2.4)
MAGNESIUM: 2.5 mg/dL — AB (ref 1.7–2.4)

## 2018-06-13 LAB — HEMOGLOBIN A1C
Hgb A1c MFr Bld: 12.4 % — ABNORMAL HIGH (ref 4.8–5.6)
Mean Plasma Glucose: 309 mg/dL

## 2018-06-13 MED ORDER — SODIUM BICARBONATE 8.4 % IV SOLN
50.0000 meq | Freq: Once | INTRAVENOUS | Status: AC
  Administered 2018-06-13: 50 meq via INTRAVENOUS

## 2018-06-13 MED ORDER — FUROSEMIDE 10 MG/ML IJ SOLN
80.0000 mg | Freq: Once | INTRAMUSCULAR | Status: AC
  Administered 2018-06-13: 80 mg via INTRAVENOUS
  Filled 2018-06-13: qty 8

## 2018-06-13 MED ORDER — INSULIN DETEMIR 100 UNIT/ML ~~LOC~~ SOLN
15.0000 [IU] | Freq: Two times a day (BID) | SUBCUTANEOUS | Status: DC
  Administered 2018-06-13 (×2): 15 [IU] via SUBCUTANEOUS
  Filled 2018-06-13 (×3): qty 0.15

## 2018-06-13 MED ORDER — INSULIN ASPART 100 UNIT/ML ~~LOC~~ SOLN
0.0000 [IU] | SUBCUTANEOUS | Status: DC
  Administered 2018-06-13 (×2): 2 [IU] via SUBCUTANEOUS
  Administered 2018-06-13: 4 [IU] via SUBCUTANEOUS
  Administered 2018-06-13: 2 [IU] via SUBCUTANEOUS
  Administered 2018-06-14 – 2018-06-15 (×5): 4 [IU] via SUBCUTANEOUS
  Administered 2018-06-15 (×2): 2 [IU] via SUBCUTANEOUS
  Administered 2018-06-16 (×2): 4 [IU] via SUBCUTANEOUS
  Administered 2018-06-16: 2 [IU] via SUBCUTANEOUS
  Administered 2018-06-17: 4 [IU] via SUBCUTANEOUS
  Administered 2018-06-17 (×2): 2 [IU] via SUBCUTANEOUS
  Administered 2018-06-17: 8 [IU] via SUBCUTANEOUS
  Administered 2018-06-18: 4 [IU] via SUBCUTANEOUS
  Administered 2018-06-18: 2 [IU] via SUBCUTANEOUS
  Administered 2018-06-18 (×2): 4 [IU] via SUBCUTANEOUS

## 2018-06-13 MED ORDER — METOPROLOL TARTRATE 25 MG PO TABS
25.0000 mg | ORAL_TABLET | Freq: Two times a day (BID) | ORAL | Status: DC
  Administered 2018-06-13 – 2018-06-16 (×6): 25 mg via ORAL
  Filled 2018-06-13 (×6): qty 1

## 2018-06-13 MED ORDER — INSULIN ASPART 100 UNIT/ML ~~LOC~~ SOLN
4.0000 [IU] | Freq: Three times a day (TID) | SUBCUTANEOUS | Status: DC
  Administered 2018-06-14 – 2018-06-16 (×7): 4 [IU] via SUBCUTANEOUS

## 2018-06-13 MED ORDER — DOPAMINE-DEXTROSE 3.2-5 MG/ML-% IV SOLN
2.0000 ug/kg/min | INTRAVENOUS | Status: DC
  Administered 2018-06-13: 2.5 ug/kg/min via INTRAVENOUS

## 2018-06-13 MED ORDER — METOPROLOL TARTRATE 25 MG/10 ML ORAL SUSPENSION: 25.0000 mg | Freq: Two times a day (BID) | ORAL | Status: DC

## 2018-06-13 MED ORDER — FUROSEMIDE 10 MG/ML IJ SOLN
40.0000 mg | Freq: Two times a day (BID) | INTRAMUSCULAR | Status: DC
  Administered 2018-06-13 (×2): 40 mg via INTRAVENOUS
  Filled 2018-06-13: qty 4

## 2018-06-13 MED ORDER — LEVALBUTEROL HCL 0.63 MG/3ML IN NEBU: 0.6300 mg | INHALATION_SOLUTION | Freq: Four times a day (QID) | RESPIRATORY_TRACT | Status: DC | PRN

## 2018-06-13 MED ORDER — FAMOTIDINE IN NACL 20-0.9 MG/50ML-% IV SOLN
20.0000 mg | Freq: Two times a day (BID) | INTRAVENOUS | Status: AC
  Administered 2018-06-13: 20 mg via INTRAVENOUS
  Filled 2018-06-13: qty 50

## 2018-06-13 MED FILL — Heparin Sodium (Porcine) Inj 1000 Unit/ML: INTRAMUSCULAR | Qty: 10 | Status: AC

## 2018-06-13 MED FILL — Magnesium Sulfate Inj 50%: INTRAMUSCULAR | Qty: 10 | Status: AC

## 2018-06-13 MED FILL — Sodium Bicarbonate IV Soln 8.4%: INTRAVENOUS | Qty: 50 | Status: AC

## 2018-06-13 MED FILL — Heparin Sodium (Porcine) Inj 1000 Unit/ML: INTRAMUSCULAR | Qty: 30 | Status: AC

## 2018-06-13 MED FILL — Dexmedetomidine HCl in NaCl 0.9% IV Soln 400 MCG/100ML: INTRAVENOUS | Qty: 100 | Status: AC

## 2018-06-13 MED FILL — Electrolyte-R (PH 7.4) Solution: INTRAVENOUS | Qty: 3000 | Status: AC

## 2018-06-13 MED FILL — Potassium Chloride Inj 2 mEq/ML: INTRAVENOUS | Qty: 40 | Status: AC

## 2018-06-13 MED FILL — Lidocaine HCl(Cardiac) IV PF Soln Pref Syr 100 MG/5ML (2%): INTRAVENOUS | Qty: 5 | Status: AC

## 2018-06-13 MED FILL — Sodium Chloride IV Soln 0.9%: INTRAVENOUS | Qty: 2000 | Status: AC

## 2018-06-13 MED FILL — Mannitol IV Soln 20%: INTRAVENOUS | Qty: 500 | Status: AC

## 2018-06-13 NOTE — Progress Notes (Signed)
1 Day Post-Op Procedure(s) (LRB): CORONARY ARTERY BYPASS GRAFTING (CABG) x 3 WITH ENDOSCOPIC HARVESTING OF RIGHT GREATER SAPHENOUS VEIN. LIMA TO LAD. SVG TO PD. SVG TO DIAGONAL. (N/A) TRANSESOPHAGEAL ECHOCARDIOGRAM (TEE) (N/A) Subjective: Ischemic cardiomyopathy, COPD active smoker LLE ABI 0.3 Needs him flow O2- lasix , nebs ordered Objective: Vital signs in last 24 hours: Temp:  [97.2 F (36.2 C)-99.1 F (37.3 C)] 98.3 F (36.8 C) (08/13 1127) Pulse Rate:  [76-112] 109 (08/13 1434) Cardiac Rhythm: (P) Normal sinus rhythm (08/13 1045) Resp:  [13-31] 20 (08/13 1434) BP: (91-135)/(56-102) 124/86 (08/13 1434) SpO2:  [88 %-100 %] 92 % (08/13 1434) Arterial Line BP: (72-177)/(49-73) 158/72 (08/13 1200) FiO2 (%):  [40 %-70 %] 40 % (08/12 1815) Weight:  [97.7 kg] 97.7 kg (08/13 0543)  Hemodynamic parameters for last 24 hours: PAP: (24-48)/(11-24) 48/19 CO:  [4.2 L/min-7.8 L/min] 6.1 L/min CI:  [2.1 L/min/m2-3.8 L/min/m2] 3 L/min/m2  Intake/Output from previous day: 08/12 0701 - 08/13 0700 In: 1610.96879.2 [I.V.:5403.8; Blood:200; IV Piggyback:1275.4] Out: 2845 [Urine:1905; Blood:500; Chest Tube:440] Intake/Output this shift: Total I/O In: 454.4 [I.V.:266.5; IV Piggyback:187.8] Out: 900 [Urine:800; Chest Tube:100]       Exam    General- alert and comfortable    Neck- no JVD, no cervical adenopathy palpable, no carotid bruit   Lungs- clear without rales, wheezes   Cor- regular rate and rhythm, no murmur , gallop   Abdomen- soft, non-tender   Extremities - warm, non-tender, minimal edema   Neuro- oriented, appropriate, no focal weakness   Lab Results: Recent Labs    06/12/18 2003 06/13/18 0309  WBC 12.6* 15.4*  HGB 10.5*  10.7* 10.9*  HCT 31.0*  32.2* 33.9*  PLT 171 197   BMET:  Recent Labs    06/12/18 0522  06/12/18 2003 06/13/18 0309  NA 131*   < > 133* 134*  K 4.4   < > 4.7 4.6  CL 98   < > 101 104  CO2 20*  --   --  20*  GLUCOSE 159*   < > 145* 93  BUN 21*    < > 19 16  CREATININE 1.40*   < > 1.30*  1.35* 1.40*  CALCIUM 8.6*  --   --  7.6*   < > = values in this interval not displayed.    PT/INR:  Recent Labs    06/12/18 1430  LABPROT 17.5*  INR 1.45   ABG    Component Value Date/Time   PHART 7.382 06/13/2018 1000   HCO3 20.9 06/13/2018 1000   TCO2 22 06/12/2018 2003   ACIDBASEDEF 3.4 (H) 06/13/2018 1000   O2SAT 52.9 06/13/2018 1118   CBG (last 3)  Recent Labs    06/13/18 1005 06/13/18 1113 06/13/18 1220  GLUCAP 180* 184* 118*    Assessment/Plan: S/P Procedure(s) (LRB): CORONARY ARTERY BYPASS GRAFTING (CABG) x 3 WITH ENDOSCOPIC HARVESTING OF RIGHT GREATER SAPHENOUS VEIN. LIMA TO LAD. SVG TO PD. SVG TO DIAGONAL. (N/A) TRANSESOPHAGEAL ECHOCARDIOGRAM (TEE) (N/A) Mobilize Diuresis Diabetes control d/c tubes/lines See progression orders   LOS: 5 days    Kathlee Nationseter Van Trigt III 06/13/2018

## 2018-06-13 NOTE — Progress Notes (Signed)
      [address]       [place] [id number]             [phone]      POD # 1 CABG x 3  On BIPAP 12L, 100% O2  BP (!) (P) 152/81 (BP Location: Right Arm)   Pulse (!) 115   Temp 97.7 F (36.5 C) (Oral)   Resp (!) 21   Ht 5\' 8"  (1.727 m)   Wt 97.7 kg   SpO2 97%   BMI 32.75 kg/m   Intake/Output Summary (Last 24 hours) at 06/13/2018 1828 Last data filed at 06/13/2018 1800 Gross per 24 hour  Intake 1863.8 ml  Output 2625 ml  Net -761.2 ml   1L negative so far today- moderate response to 80 mg of lasix  On low dose dopamine in addition to milrinone  Viviann SpareSteven C. Dorris FetchHendrickson, MD [facility] [phone]

## 2018-06-13 NOTE — Progress Notes (Addendum)
Ref: Patient, No Pcp Per   Subjective:  Awake. T max 99.1 degree F. IV milrinone and dopamine drip continues.   Objective:  Vital Signs in the last 24 hours: Temp:  [97.5 F (36.4 C)-99.1 F (37.3 C)] 97.7 F (36.5 C) (08/13 1555) Pulse Rate:  [80-112] 103 (08/13 1600) Cardiac Rhythm: Normal sinus rhythm (08/13 1045) Resp:  [13-31] 16 (08/13 1600) BP: (91-153)/(56-102) 102/78 (08/13 1600) SpO2:  [82 %-100 %] 97 % (08/13 1600) Arterial Line BP: (72-177)/(49-73) 158/72 (08/13 1200) FiO2 (%):  [40 %-100 %] 100 % (08/13 1600) Weight:  [97.7 kg] 97.7 kg (08/13 0543)  Physical Exam: BP Readings from Last 1 Encounters:  06/13/18 102/78     Wt Readings from Last 1 Encounters:  06/13/18 97.7 kg    Weight change: -0.02 kg Body mass index is 32.75 kg/m. HEENT: [place]/AT, Eyes-Blue, Conjunctiva-Pink, Sclera-Non-icteric Neck: No JVD, No bruit, Trachea midline. Lungs:  Clearing, Bilateral. Cardiac:  Regular rhythm, normal S1 and S2, no S3. II/VI systolic murmur. Abdomen:  Soft, non-tender. BS present. Extremities:  No edema present. No cyanosis. No clubbing. CNS: AxOx3, Cranial nerves grossly intact, moves all 4 extremities.  Skin: Warm and dry.   Intake/Output from previous day: 08/12 0701 - 08/13 0700 In: 4098.16879.2 [I.V.:5403.8; Blood:200; IV Piggyback:1275.4] Out: 2845 [Urine:1905; Blood:500; Chest Tube:440]    Lab Results: BMET    Component Value Date/Time   NA 132 (L) 06/13/2018 1647   NA 134 (L) 06/13/2018 0309   NA 133 (L) 06/12/2018 2003   K 4.9 06/13/2018 1647   K 4.6 06/13/2018 0309   K 4.7 06/12/2018 2003   CL 97 (L) 06/13/2018 1647   CL 104 06/13/2018 0309   CL 101 06/12/2018 2003   CO2 20 (L) 06/13/2018 0309   CO2 20 (L) 06/12/2018 0522   CO2 23 06/11/2018 0413   GLUCOSE 149 (H) 06/13/2018 1647   GLUCOSE 93 06/13/2018 0309   GLUCOSE 145 (H) 06/12/2018 2003   BUN 16 06/13/2018 1647   BUN 16 06/13/2018 0309   BUN 19 06/12/2018 2003   CREATININE 1.70 (H)  06/13/2018 1647   CREATININE 1.40 (H) 06/13/2018 0309   CREATININE 1.35 (H) 06/12/2018 2003   CREATININE 1.30 (H) 06/12/2018 2003   CREATININE 0.81 08/14/2014 1126   CREATININE 0.82 11/26/2013 1210   CALCIUM 7.6 (L) 06/13/2018 0309   CALCIUM 8.6 (L) 06/12/2018 0522   CALCIUM 8.8 (L) 06/11/2018 0413   GFRNONAA 53 (L) 06/13/2018 0309   GFRNONAA 56 (L) 06/12/2018 2003   GFRNONAA 53 (L) 06/12/2018 0522   GFRNONAA >89 08/14/2014 1126   GFRAA >60 06/13/2018 0309   GFRAA >60 06/12/2018 2003   GFRAA >60 06/12/2018 0522   GFRAA >89 08/14/2014 1126   CBC    Component Value Date/Time   WBC 15.4 (H) 06/13/2018 0309   RBC 3.65 (L) 06/13/2018 0309   HGB 12.2 (L) 06/13/2018 1647   HCT 36.0 (L) 06/13/2018 1647   PLT 197 06/13/2018 0309   MCV 92.9 06/13/2018 0309   MCH 29.9 06/13/2018 0309   MCHC 32.2 06/13/2018 0309   RDW 12.7 06/13/2018 0309   LYMPHSABS 4.8 (H) 06/03/2018 0442   MONOABS 1.3 (H) 06/03/2018 0442   EOSABS 0.3 06/03/2018 0442   BASOSABS 0.1 06/03/2018 0442   HEPATIC Function Panel Recent Labs    06/03/18 0442 06/08/18 0936 06/11/18 0413  PROT 7.2 8.0 7.7   HEMOGLOBIN A1C No components found for: HGA1C,  MPG CARDIAC ENZYMES Lab Results  Component Value  Date   TROPONINI 0.25 (HH) 06/09/2018   TROPONINI 0.32 (HH) 06/08/2018   TROPONINI 0.31 (HH) 06/08/2018   BNP No results for input(s): PROBNP in the last 8760 hours. TSH Recent Labs    06/03/18 1414 06/10/18 0342  TSH 0.733 3.719   CHOLESTEROL Recent Labs    06/09/18 0450 06/10/18 0342  CHOL 166 148    Scheduled Meds: . acetaminophen  1,000 mg Oral Q6H   Or  . acetaminophen (TYLENOL) oral liquid 160 mg/5 mL  1,000 mg Per Tube Q6H  . aspirin EC  325 mg Oral Daily   Or  . aspirin  324 mg Per Tube Daily  . atorvastatin  40 mg Oral q1800  . bisacodyl  10 mg Oral Daily   Or  . bisacodyl  10 mg Rectal Daily  . Chlorhexidine Gluconate Cloth  6 each Topical Daily  . docusate sodium  200 mg Oral  Daily  . furosemide  40 mg Intravenous BID  . insulin aspart  0-24 Units Subcutaneous Q4H  . insulin aspart  4 Units Subcutaneous TID WC  . insulin detemir  15 Units Subcutaneous BID  . mouth rinse  15 mL Mouth Rinse BID  . metoCLOPramide (REGLAN) injection  10 mg Intravenous Q6H  . metoprolol tartrate  12.5 mg Oral BID   Or  . metoprolol tartrate  12.5 mg Per Tube BID  . mometasone-formoterol  2 puff Inhalation BID  . [START ON 06/14/2018] pantoprazole  40 mg Oral Daily  . sodium chloride flush  10-40 mL Intracatheter Q12H  . sodium chloride flush  3 mL Intravenous Q12H   Continuous Infusions: . sodium chloride Stopped (06/13/18 1658)  . sodium chloride    . sodium chloride 10 mL/hr (06/12/18 1400)  . cefUROXime (ZINACEF)  IV 200 mL/hr at 06/13/18 1700  . dexmedetomidine (PRECEDEX) IV infusion Stopped (06/12/18 1718)  . DOPamine 1 mcg/kg/min (06/13/18 1700)  . famotidine (PEPCID) IV    . lactated ringers    . lactated ringers Stopped (06/13/18 1038)  . milrinone 0.3 mcg/kg/min (06/13/18 1700)  . vancomycin Stopped (06/13/18 0756)   PRN Meds:.sodium chloride, levalbuterol, metoprolol tartrate, midazolam, morphine injection, ondansetron (ZOFRAN) IV, oxyCODONE, sodium chloride flush, sodium chloride flush, traMADol  Assessment/Plan: CABG x 3 LM/3 vessel CAD Type 2 DM PVD Obesity  Continue milrinone drip in patient with ischemic cardiomyopathy with poor LV systolic function.   LOS: 5 days    Orpah CobbAjay Sandee Bernath  MD  06/13/2018, 5:19 PM

## 2018-06-14 ENCOUNTER — Inpatient Hospital Stay (HOSPITAL_COMMUNITY): Payer: Self-pay

## 2018-06-14 ENCOUNTER — Inpatient Hospital Stay: Payer: Self-pay

## 2018-06-14 LAB — BASIC METABOLIC PANEL
Anion gap: 9 (ref 5–15)
BUN: 15 mg/dL (ref 6–20)
CO2: 25 mmol/L (ref 22–32)
Calcium: 8.2 mg/dL — ABNORMAL LOW (ref 8.9–10.3)
Chloride: 97 mmol/L — ABNORMAL LOW (ref 98–111)
Creatinine, Ser: 1.6 mg/dL — ABNORMAL HIGH (ref 0.61–1.24)
GFR calc Af Amer: 52 mL/min — ABNORMAL LOW (ref >60)
GFR calc non Af Amer: 45 mL/min — ABNORMAL LOW (ref >60)
Glucose, Bld: 103 mg/dL — ABNORMAL HIGH (ref 70–99)
Potassium: 4.2 mmol/L (ref 3.5–5.1)
Sodium: 131 mmol/L — ABNORMAL LOW (ref 135–145)

## 2018-06-14 LAB — CBC
HCT: 33.9 % — ABNORMAL LOW (ref 39.0–52.0)
Hemoglobin: 10.8 g/dL — ABNORMAL LOW (ref 13.0–17.0)
MCH: 30 pg (ref 26.0–34.0)
MCHC: 31.9 g/dL (ref 30.0–36.0)
MCV: 94.2 fL (ref 78.0–100.0)
Platelets: 202 10*3/uL (ref 150–400)
RBC: 3.6 MIL/uL — ABNORMAL LOW (ref 4.22–5.81)
RDW: 12.8 % (ref 11.5–15.5)
WBC: 16.1 10*3/uL — ABNORMAL HIGH (ref 4.0–10.5)

## 2018-06-14 LAB — POCT I-STAT, CHEM 8
BUN: 16 mg/dL (ref 6–20)
CALCIUM ION: 1.11 mmol/L — AB (ref 1.15–1.40)
Chloride: 94 mmol/L — ABNORMAL LOW (ref 98–111)
Creatinine, Ser: 1.6 mg/dL — ABNORMAL HIGH (ref 0.61–1.24)
GLUCOSE: 197 mg/dL — AB (ref 70–99)
HCT: 33 % — ABNORMAL LOW (ref 39.0–52.0)
HEMOGLOBIN: 11.2 g/dL — AB (ref 13.0–17.0)
POTASSIUM: 3.9 mmol/L (ref 3.5–5.1)
SODIUM: 133 mmol/L — AB (ref 135–145)
TCO2: 24 mmol/L (ref 22–32)

## 2018-06-14 LAB — GLUCOSE, CAPILLARY
GLUCOSE-CAPILLARY: 94 mg/dL (ref 70–99)
GLUCOSE-CAPILLARY: 166 mg/dL — AB (ref 70–99)
Glucose-Capillary: 91 mg/dL (ref 70–99)
Glucose-Capillary: 201 mg/dL — ABNORMAL HIGH (ref 70–99)
Glucose-Capillary: 196 mg/dL — ABNORMAL HIGH (ref 70–99)

## 2018-06-14 LAB — COOXEMETRY PANEL
Carboxyhemoglobin: 1.3 % (ref 0.5–1.5)
Methemoglobin: 1.3 % (ref 0.0–1.5)
O2 Saturation: 63.6 %
Total hemoglobin: 11.3 g/dL — ABNORMAL LOW (ref 12.0–16.0)

## 2018-06-14 MED ORDER — INSULIN STARTER KIT- PEN NEEDLES (ENGLISH)
1.0000 | Freq: Once | Status: AC
  Administered 2018-06-14: 1
  Filled 2018-06-14: qty 1

## 2018-06-14 MED ORDER — MILRINONE LACTATE IN DEXTROSE 20-5 MG/100ML-% IV SOLN
0.1250 ug/kg/min | INTRAVENOUS | Status: DC
  Administered 2018-06-14: 0.25 ug/kg/min via INTRAVENOUS
  Administered 2018-06-15 – 2018-06-18 (×3): 0.125 ug/kg/min via INTRAVENOUS
  Filled 2018-06-14 (×5): qty 100

## 2018-06-14 MED ORDER — SODIUM CHLORIDE 0.9% FLUSH
10.0000 mL | Freq: Two times a day (BID) | INTRAVENOUS | Status: DC
  Administered 2018-06-14 – 2018-06-18 (×6): 10 mL

## 2018-06-14 MED ORDER — SODIUM CHLORIDE 0.9% FLUSH: 10.0000 mL | INTRAVENOUS | Status: DC | PRN

## 2018-06-14 MED ORDER — INSULIN DETEMIR 100 UNIT/ML ~~LOC~~ SOLN: 12.0000 [IU] | Freq: Two times a day (BID) | SUBCUTANEOUS | Status: DC

## 2018-06-14 MED ORDER — INSULIN DETEMIR 100 UNIT/ML ~~LOC~~ SOLN
15.0000 [IU] | Freq: Two times a day (BID) | SUBCUTANEOUS | Status: DC
  Administered 2018-06-14 – 2018-06-16 (×6): 15 [IU] via SUBCUTANEOUS
  Filled 2018-06-14 (×8): qty 0.15

## 2018-06-14 MED ORDER — FUROSEMIDE 10 MG/ML IJ SOLN
80.0000 mg | Freq: Two times a day (BID) | INTRAMUSCULAR | Status: DC
  Administered 2018-06-14 – 2018-06-15 (×3): 80 mg via INTRAVENOUS
  Filled 2018-06-14 (×3): qty 8

## 2018-06-14 NOTE — Progress Notes (Signed)
Ref: Patient, No Pcp Per   Subjective:  Awake. Afebrile. Off pressures. Low dose milrinone continues. CXR stable.  Objective:  Vital Signs in the last 24 hours: Temp:  [97.3 F (36.3 C)-98.7 F (37.1 C)] 98.7 F (37.1 C) (08/14 1551) Pulse Rate:  [81-123] 109 (08/14 1700) Cardiac Rhythm: Sinus tachycardia (08/14 1600) Resp:  [7-32] 28 (08/14 1700) BP: (94-161)/(56-91) 140/65 (08/14 1700) SpO2:  [90 %-100 %] 93 % (08/14 1923) Weight:  [96.8 kg] 96.8 kg (08/14 0600)  Physical Exam: BP Readings from Last 1 Encounters:  06/14/18 140/65     Wt Readings from Last 1 Encounters:  06/14/18 96.8 kg    Weight change: 6.6 kg Body mass index is 32.45 kg/m. HEENT: [place]/AT, Eyes-Blue, PERL, EOMI, Conjunctiva-Pink, Sclera-Non-icteric Neck: No JVD, No bruit, Trachea midline. Lungs:  Clearing, Bilateral. Midline chest scar.  Cardiac:  Regular rhythm, normal S1 and S2, no S3. II/VI systolic murmur. Abdomen:  Soft, non-tender. BS present. Extremities:  No edema present. No cyanosis. No clubbing. CNS: AxOx3, Cranial nerves grossly intact, moves all 4 extremities.  Skin: Warm and dry.   Intake/Output from previous day: 08/13 0701 - 08/14 0700 In: 890.7 [I.V.:502.8; IV Piggyback:387.9] Out: 3185 [Urine:3085; Chest Tube:100]    Lab Results: BMET    Component Value Date/Time   NA 133 (L) 06/14/2018 1645   NA 131 (L) 06/14/2018 0357   NA 132 (L) 06/13/2018 1647   K 3.9 06/14/2018 1645   K 4.2 06/14/2018 0357   K 4.9 06/13/2018 1647   CL 94 (L) 06/14/2018 1645   CL 97 (L) 06/14/2018 0357   CL 97 (L) 06/13/2018 1647   CO2 25 06/14/2018 0357   CO2 20 (L) 06/13/2018 0309   CO2 20 (L) 06/12/2018 0522   GLUCOSE 197 (H) 06/14/2018 1645   GLUCOSE 103 (H) 06/14/2018 0357   GLUCOSE 149 (H) 06/13/2018 1647   BUN 16 06/14/2018 1645   BUN 15 06/14/2018 0357   BUN 16 06/13/2018 1647   CREATININE 1.60 (H) 06/14/2018 1645   CREATININE 1.60 (H) 06/14/2018 0357   CREATININE 1.66 (H) 06/13/2018  1715   CREATININE 0.81 08/14/2014 1126   CREATININE 0.82 11/26/2013 1210   CALCIUM 8.2 (L) 06/14/2018 0357   CALCIUM 7.6 (L) 06/13/2018 0309   CALCIUM 8.6 (L) 06/12/2018 0522   GFRNONAA 45 (L) 06/14/2018 0357   GFRNONAA 43 (L) 06/13/2018 1715   GFRNONAA 53 (L) 06/13/2018 0309   GFRNONAA >89 08/14/2014 1126   GFRAA 52 (L) 06/14/2018 0357   GFRAA 50 (L) 06/13/2018 1715   GFRAA >60 06/13/2018 0309   GFRAA >89 08/14/2014 1126   CBC    Component Value Date/Time   WBC 16.1 (H) 06/14/2018 0357   RBC 3.60 (L) 06/14/2018 0357   HGB 11.2 (L) 06/14/2018 1645   HCT 33.0 (L) 06/14/2018 1645   PLT 202 06/14/2018 0357   MCV 94.2 06/14/2018 0357   MCH 30.0 06/14/2018 0357   MCHC 31.9 06/14/2018 0357   RDW 12.8 06/14/2018 0357   LYMPHSABS 4.8 (H) 06/03/2018 0442   MONOABS 1.3 (H) 06/03/2018 0442   EOSABS 0.3 06/03/2018 0442   BASOSABS 0.1 06/03/2018 0442   HEPATIC Function Panel Recent Labs    06/03/18 0442 06/08/18 0936 06/11/18 0413  PROT 7.2 8.0 7.7   HEMOGLOBIN A1C No components found for: HGA1C,  MPG CARDIAC ENZYMES Lab Results  Component Value Date   TROPONINI 0.25 (HH) 06/09/2018   TROPONINI 0.32 (HH) 06/08/2018   TROPONINI 0.31 (HH) 06/08/2018  BNP No results for input(s): PROBNP in the last 8760 hours. TSH Recent Labs    06/03/18 1414 06/10/18 0342  TSH 0.733 3.719   CHOLESTEROL Recent Labs    06/09/18 0450 06/10/18 0342  CHOL 166 148    Scheduled Meds: . acetaminophen  1,000 mg Oral Q6H   Or  . acetaminophen (TYLENOL) oral liquid 160 mg/5 mL  1,000 mg Per Tube Q6H  . aspirin EC  325 mg Oral Daily   Or  . aspirin  324 mg Per Tube Daily  . atorvastatin  40 mg Oral q1800  . bisacodyl  10 mg Oral Daily   Or  . bisacodyl  10 mg Rectal Daily  . Chlorhexidine Gluconate Cloth  6 each Topical Daily  . docusate sodium  200 mg Oral Daily  . furosemide  80 mg Intravenous BID  . insulin aspart  0-24 Units Subcutaneous Q4H  . insulin aspart  4 Units  Subcutaneous TID WC  . insulin detemir  15 Units Subcutaneous BID  . mouth rinse  15 mL Mouth Rinse BID  . metoCLOPramide (REGLAN) injection  10 mg Intravenous Q6H  . metoprolol tartrate  25 mg Oral BID   Or  . metoprolol tartrate  25 mg Per Tube BID  . mometasone-formoterol  2 puff Inhalation BID  . pantoprazole  40 mg Oral Daily  . sodium chloride flush  10-40 mL Intracatheter Q12H  . sodium chloride flush  10-40 mL Intracatheter Q12H  . sodium chloride flush  3 mL Intravenous Q12H   Continuous Infusions: . sodium chloride Stopped (06/13/18 2001)  . sodium chloride    . sodium chloride 10 mL/hr (06/14/18 0509)  . DOPamine Stopped (06/14/18 1100)  . lactated ringers    . lactated ringers Stopped (06/13/18 1038)  . milrinone 0.25 mcg/kg/min (06/14/18 1810)   PRN Meds:.sodium chloride, levalbuterol, metoprolol tartrate, midazolam, morphine injection, ondansetron (ZOFRAN) IV, oxyCODONE, sodium chloride flush, sodium chloride flush, sodium chloride flush, traMADol  Assessment/Plan: CABG x 3 LM/3 Vessel CAD Type 2 DM PVD Obesity  Continue medical treatment and activity.D   LOS: 6 days    Orpah CobbAjay Silverio Hagan  MD  06/14/2018, 8:08 PM

## 2018-06-14 NOTE — Progress Notes (Signed)
Peripherally Inserted Central Catheter/Midline Placement  The IV Nurse has discussed with the patient and/or persons authorized to consent for the patient, the purpose of this procedure and the potential benefits and risks involved with this procedure.  The benefits include less needle sticks, lab draws from the catheter, and the patient may be discharged home with the catheter. Risks include, but not limited to, infection, bleeding, blood clot (thrombus formation), and puncture of an artery; nerve damage and irregular heartbeat and possibility to perform a PICC exchange if needed/ordered by physician.  Alternatives to this procedure were also discussed.  B[employer] PICC patient education guide, fact sheet on infection prevention and patient information card has been provided to patient /or left at bedside.    PICC/Midline Placement Documentation        Timmothy Soursewman, Mirriam Vadala Renee 06/14/2018, 3:31 PM

## 2018-06-14 NOTE — Progress Notes (Signed)
CT Surgery  Remains on hi flow O2 - sat 94% nsr Mil at 0.2  Pm labs ok

## 2018-06-14 NOTE — Progress Notes (Signed)
Pt remains on NIV throughout the night requiring increase oxygenation support. FiO2 > 60. No breakdown or redness noted on skin. Pt is stable at this time. No complications noted. No respiratory compromise noted. SATs currently 94% on 70% FiO2. With good pleth

## 2018-06-14 NOTE — Progress Notes (Signed)
2 Days Post-Op Procedure(s) (LRB): CORONARY ARTERY BYPASS GRAFTING (CABG) x 3 WITH ENDOSCOPIC HARVESTING OF RIGHT GREATER SAPHENOUS VEIN. LIMA TO LAD. SVG TO PD. SVG TO DIAGONAL. (N/A) TRANSESOPHAGEAL ECHOCARDIOGRAM (TEE) (N/A) Subjective: Needed BIPAP yesterday but with diuresis now off on [place]- CXR w/ mild edema nsr- low dose milrinone for ischemic cardiomyopathy Objective: Vital signs in last 24 hours: Temp:  [97.3 F (36.3 C)-98.6 F (37 C)] 98.6 F (37 C) (08/14 0746) Pulse Rate:  [81-122] 104 (08/14 0700) Cardiac Rhythm: Sinus tachycardia (08/14 0300) Resp:  [7-30] 27 (08/14 0700) BP: (97-153)/(66-102) 120/72 (08/14 0700) SpO2:  [82 %-98 %] 94 % (08/14 0717) Arterial Line BP: (149-161)/(62-73) 158/72 (08/13 1200) FiO2 (%):  [100 %] 100 % (08/13 1900) Weight:  [96.8 kg] 96.8 kg (08/14 0600)  Hemodynamic parameters for last 24 hours: PAP: (38-48)/(17-21) 48/19 CO:  [6.1 L/min] 6.1 L/min CI:  [3 L/min/m2] 3 L/min/m2  Intake/Output from previous day: 08/13 0701 - 08/14 0700 In: 890.7 [I.V.:502.8; IV Piggyback:387.9] Out: 3185 [Urine:3085; Chest Tube:100] Intake/Output this shift: No intake/output data recorded.      Exam    General- alert and comfortable    Neck- no JVD, no cervical adenopathy palpable, no carotid bruit   Lungs- coarse BS with ronchi, wheezes   Cor- regular rate and rhythm, no murmur , gallop   Abdomen- soft, non-tender   Extremities - warm, non-tender, minimal edema   Neuro- oriented, appropriate, no focal weakness    Lab Results: Recent Labs    06/13/18 1715 06/14/18 0357  WBC 17.3* 16.1*  HGB 11.4* 10.8*  HCT 35.7* 33.9*  PLT 197 202   BMET:  Recent Labs    06/13/18 0309 06/13/18 1647 06/13/18 1715 06/14/18 0357  NA 134* 132*  --  131*  K 4.6 4.9  --  4.2  CL 104 97*  --  97*  CO2 20*  --   --  25  GLUCOSE 93 149*  --  103*  BUN 16 16  --  15  CREATININE 1.40* 1.70* 1.66* 1.60*  CALCIUM 7.6*  --   --  8.2*    PT/INR:  Recent  Labs    06/12/18 1430  LABPROT 17.5*  INR 1.45   ABG    Component Value Date/Time   PHART 7.382 06/13/2018 1000   HCO3 20.9 06/13/2018 1000   TCO2 23 06/13/2018 1647   ACIDBASEDEF 3.4 (H) 06/13/2018 1000   O2SAT 63.6 06/14/2018 0356   CBG (last 3)  Recent Labs    06/13/18 2343 06/14/18 0407 06/14/18 0736  GLUCAP 126* 94 91    Assessment/Plan: S/P Procedure(s) (LRB): CORONARY ARTERY BYPASS GRAFTING (CABG) x 3 WITH ENDOSCOPIC HARVESTING OF RIGHT GREATER SAPHENOUS VEIN. LIMA TO LAD. SVG TO PD. SVG TO DIAGONAL. (N/A) TRANSESOPHAGEAL ECHOCARDIOGRAM (TEE) (N/A) Mobilize Diuresis cont aggressive diuresis- follow elevated creatinine, leave foley   LOS: 6 days    Kathlee Nationseter Van Trigt III 06/14/2018

## 2018-06-14 NOTE — Progress Notes (Signed)
Inpatient Diabetes Program Recommendations  AACE/ADA: New Consensus Statement on Inpatient Glycemic Control (2019)  Target Ranges:  Prepandial:   less than 140 mg/dL      Peak postprandial:   less than 180 mg/dL (1-2 hours)      Critically ill patients:  140 - 180 mg/dL  Results for THOMSON, HERBERS (MRN [id number]) as of 06/14/2018 07:31  Ref. Range 06/13/2018 10:05 06/13/2018 11:13 06/13/2018 12:20 06/13/2018 13:50 06/13/2018 15:53 06/13/2018 19:51 06/13/2018 23:43 06/14/2018 04:07  Glucose-Capillary Latest Ref Range: 70 - 99 mg/dL 180 (H) 184 (H) 118 (H) 134 (H) 140 (H) 160 (H) 126 (H) 94  Results for MAXTON, NOREEN (MRN [id number]) as of 06/14/2018 07:31  Ref. Range 08/23/2016 09:47 06/03/2018 09:24 06/11/2018 04:13 06/12/2018 05:22  Hemoglobin A1C Latest Ref Range: 4.8 - 5.6 % 13.1 12.4 (H) 12.3 (H) 12.4 (H)   Review of Glycemic Control  Diabetes history: DM2 Outpatient Diabetes medications: Metformin 1000 mg BID Current orders for Inpatient glycemic control: Levemir 15 units BID, Novolog 0-24 units Q4H, Novolog 4 units TID with meals for meal coverage  Inpatient Diabetes Program Recommendations: HgbA1C: A1C 12.4% on 06/12/18 indicating an average glucose of 309 mg/dl over the past 2-3 months. Patient will need to be discharged on affordable insulin for DM control.  NOTE: Patient was seen by Inpatient Diabetes Coordinator during last admission on 06/05/18 regarding DM control and insulin. Plan was for patient to be discharged on 70/30 insulin due to cost. However, patient left AMA on 06/06/18.  Patient has no insurance or PCP. Ordered CM consult for medication assistance and follow up. Ordered insulin starter kit, patient education by bedside RNs on insulin and DM control, and diabetes education videos. BEDSIDE NURSING: Please use each patient interaction to provide diabetes education. Please instruct on insulin administration and allow patient to check own glucose and self-administer insulin  injections.   Thanks, Barnie Alderman, RN, MSN, CDE Diabetes Coordinator Inpatient Diabetes Program [phone] (Team Pager from 8am to 5pm)

## 2018-06-15 ENCOUNTER — Inpatient Hospital Stay (HOSPITAL_COMMUNITY): Payer: Self-pay

## 2018-06-15 LAB — TYPE AND SCREEN
ABO/RH(D): O NEG
Antibody Screen: NEGATIVE
Unit division: 0
Unit division: 0

## 2018-06-15 LAB — GLUCOSE, CAPILLARY
GLUCOSE-CAPILLARY: 105 mg/dL — AB (ref 70–99)
GLUCOSE-CAPILLARY: 113 mg/dL — AB (ref 70–99)
GLUCOSE-CAPILLARY: 115 mg/dL — AB (ref 70–99)
GLUCOSE-CAPILLARY: 141 mg/dL — AB (ref 70–99)
GLUCOSE-CAPILLARY: 165 mg/dL — AB (ref 70–99)
GLUCOSE-CAPILLARY: 177 mg/dL — AB (ref 70–99)
Glucose-Capillary: 166 mg/dL — ABNORMAL HIGH (ref 70–99)

## 2018-06-15 LAB — BASIC METABOLIC PANEL
Anion gap: 10 (ref 5–15)
BUN: 15 mg/dL (ref 6–20)
CO2: 26 mmol/L (ref 22–32)
Calcium: 7.9 mg/dL — ABNORMAL LOW (ref 8.9–10.3)
Chloride: 96 mmol/L — ABNORMAL LOW (ref 98–111)
Creatinine, Ser: 1.41 mg/dL — ABNORMAL HIGH (ref 0.61–1.24)
GFR calc Af Amer: >60 mL/min (ref >60)
GFR calc non Af Amer: 53 mL/min — ABNORMAL LOW (ref >60)
Glucose, Bld: 164 mg/dL — ABNORMAL HIGH (ref 70–99)
Potassium: 3.1 mmol/L — ABNORMAL LOW (ref 3.5–5.1)
Sodium: 132 mmol/L — ABNORMAL LOW (ref 135–145)

## 2018-06-15 LAB — CBC
HCT: 30.4 % — ABNORMAL LOW (ref 39.0–52.0)
Hemoglobin: 9.8 g/dL — ABNORMAL LOW (ref 13.0–17.0)
MCH: 29.8 pg (ref 26.0–34.0)
MCHC: 32.2 g/dL (ref 30.0–36.0)
MCV: 92.4 fL (ref 78.0–100.0)
Platelets: 207 10*3/uL (ref 150–400)
RBC: 3.29 MIL/uL — ABNORMAL LOW (ref 4.22–5.81)
RDW: 12.7 % (ref 11.5–15.5)
WBC: 12.2 10*3/uL — ABNORMAL HIGH (ref 4.0–10.5)

## 2018-06-15 LAB — BPAM RBC
Blood Product Expiration Date: 201909082359
Blood Product Expiration Date: 201909082359
Unit Type and Rh: 9500
Unit Type and Rh: 9500

## 2018-06-15 LAB — COOXEMETRY PANEL
Carboxyhemoglobin: 1.3 % (ref 0.5–1.5)
Methemoglobin: 2 % — ABNORMAL HIGH (ref 0.0–1.5)
O2 Saturation: 65.2 %
Total hemoglobin: 10.2 g/dL — ABNORMAL LOW (ref 12.0–16.0)

## 2018-06-15 MED ORDER — POTASSIUM CHLORIDE CRYS ER 20 MEQ PO TBCR
20.0000 meq | EXTENDED_RELEASE_TABLET | Freq: Two times a day (BID) | ORAL | Status: DC
Start: 2018-06-15 — End: 2018-06-19
  Administered 2018-06-15 – 2018-06-19 (×9): 20 meq via ORAL
  Filled 2018-06-15 (×9): qty 1

## 2018-06-15 MED ORDER — POTASSIUM CHLORIDE 10 MEQ/50ML IV SOLN
10.0000 meq | INTRAVENOUS | Status: AC
  Administered 2018-06-15 (×3): 10 meq via INTRAVENOUS
  Filled 2018-06-15 (×3): qty 50

## 2018-06-15 MED ORDER — FUROSEMIDE 10 MG/ML IJ SOLN
80.0000 mg | Freq: Every day | INTRAMUSCULAR | Status: DC
Start: 2018-06-16 — End: 2018-06-16

## 2018-06-15 NOTE — Progress Notes (Signed)
3 Days Post-Op Procedure(s) (LRB): CORONARY ARTERY BYPASS GRAFTING (CABG) x 3 WITH ENDOSCOPIC HARVESTING OF RIGHT GREATER SAPHENOUS VEIN. LIMA TO LAD. SVG TO PD. SVG TO DIAGONAL. (N/A) TRANSESOPHAGEAL ECHOCARDIOGRAM (TEE) (N/A) Subjective: Diuresing well with better pulmonary status weaning milrinone nsr Co-oc 65% Objective: Vital signs in last 24 hours: Temp:  [98.2 F (36.8 C)-98.7 F (37.1 C)] 98.6 F (37 C) (08/15 0742) Pulse Rate:  [93-123] 96 (08/15 0742) Cardiac Rhythm: Normal sinus rhythm (08/15 0400) Resp:  [15-28] 26 (08/15 0742) BP: (94-140)/(56-91) 98/57 (08/15 0742) SpO2:  [91 %-100 %] 96 % (08/15 0742) Weight:  [95.1 kg] 95.1 kg (08/15 0500)  Hemodynamic parameters for last 24 hours:    Intake/Output from previous day: 08/14 0701 - 08/15 0700 In: 306.6 [P.O.:120; I.V.:186.6] Out: 3880 [Urine:3880] Intake/Output this shift: No intake/output data recorded.       Exam    General- alert and comfortable    Neck- no JVD, no cervical adenopathy palpable, no carotid bruit   Lungs- clear without rales, wheezes   Cor- regular rate and rhythm, no murmur , gallop   Abdomen- soft, non-tender   Extremities - warm, non-tender, minimal edema   Neuro- oriented, appropriate, no focal weakness   Lab Results: Recent Labs    06/14/18 0357 06/14/18 1645 06/15/18 0410  WBC 16.1*  --  12.2*  HGB 10.8* 11.2* 9.8*  HCT 33.9* 33.0* 30.4*  PLT 202  --  207   BMET:  Recent Labs    06/14/18 0357 06/14/18 1645 06/15/18 0410  NA 131* 133* 132*  K 4.2 3.9 3.1*  CL 97* 94* 96*  CO2 25  --  26  GLUCOSE 103* 197* 164*  BUN 15 16 15   CREATININE 1.60* 1.60* 1.41*  CALCIUM 8.2*  --  7.9*    PT/INR:  Recent Labs    06/12/18 1430  LABPROT 17.5*  INR 1.45   ABG    Component Value Date/Time   PHART 7.382 06/13/2018 1000   HCO3 20.9 06/13/2018 1000   TCO2 24 06/14/2018 1645   ACIDBASEDEF 3.4 (H) 06/13/2018 1000   O2SAT 65.2 06/15/2018 0420   CBG (last 3)   Recent Labs    06/14/18 2006 06/14/18 2351 06/15/18 0417  GLUCAP 166* 105* 177*    Assessment/Plan: S/P Procedure(s) (LRB): CORONARY ARTERY BYPASS GRAFTING (CABG) x 3 WITH ENDOSCOPIC HARVESTING OF RIGHT GREATER SAPHENOUS VEIN. LIMA TO LAD. SVG TO PD. SVG TO DIAGONAL. (N/A) TRANSESOPHAGEAL ECHOCARDIOGRAM (TEE) (N/A) Mobilize Diuresis Diabetes control Plan for transfer to step-down: see transfer orders   LOS: 7 days    Kathlee Nationseter Van Trigt III 06/15/2018

## 2018-06-15 NOTE — Progress Notes (Signed)
      [address]       [place] [id number]             [phone]      Up walking around in room  BP 125/74   Pulse 99   Temp 98.1 F (36.7 C) (Oral)   Resp (!) 22   Ht 5\' 8"  (1.727 m)   Wt 95.1 kg   SpO2 96%   BMI 31.88 kg/m  On room air  Intake/Output Summary (Last 24 hours) at 06/15/2018 1803 Last data filed at 06/15/2018 1200 Gross per 24 hour  Intake 452.61 ml  Output 3560 ml  Net -3107.39 ml   Awaiting bed on step down  Viviann SpareSteven C. Dorris FetchHendrickson, MD [facility] [phone]

## 2018-06-15 NOTE — Care Management Note (Signed)
Case Management Note Donn PieriniKristi Ashir Kunz RN,BSN Unit 2H 1-22 - RN Care Coordinator (Case Management) [phone]  Patient Details  Name: Robert Sanchez MRN: [id number] Date of Birth: 08/03/1956  Subjective/Objective:   Pt admitted with ACS s/p CABGx3 on 8/12                 Action/Plan: PTA pt lived at home, pt will need PCP and MATCH at time of discharge- CM to follow for transition of care needs.   Expected Discharge Date:                  Expected Discharge Plan:     In-House Referral:     Discharge planning Services  CM Consult, [facility], Med[facility] Assistance  Post Acute Care Choice:    Choice offered to:     DME Arranged:    DME Agency:     HH Arranged:    HH Agency:     Status of Service:  In process, will continue to follow  If discussed at Long Length of Stay Meetings, dates discussed:    Discharge Disposition:   Additional Comments:  Darrold SpanWebster, Quetzal Meany Hall, RN 06/15/2018, 11:13 AM

## 2018-06-15 NOTE — Progress Notes (Signed)
Ref: Patient, No Pcp Per   Subjective:  Sitting up. Ambulates some. Afebrile. VS stable. Good diuresis. HR in 90's. Weaning off milrinone.  Objective:  Vital Signs in the last 24 hours: Temp:  [98.2 F (36.8 C)-98.7 F (37.1 C)] 98.6 F (37 C) (08/15 0742) Pulse Rate:  [93-123] 100 (08/15 0800) Cardiac Rhythm: Normal sinus rhythm;Sinus tachycardia (08/15 0800) Resp:  [15-28] 25 (08/15 0800) BP: (94-140)/(56-91) 122/60 (08/15 0800) SpO2:  [90 %-100 %] 92 % (08/15 0827) Weight:  [95.1 kg] 95.1 kg (08/15 0500)  Physical Exam: BP Readings from Last 1 Encounters:  06/15/18 122/60     Wt Readings from Last 1 Encounters:  06/15/18 95.1 kg    Weight change: -1.7 kg Body mass index is 31.88 kg/m. HEENT: [place]/AT, Eyes-Blue, Conjunctiva-Pink, Sclera-Non-icteric Neck: No JVD, No bruit, Trachea midline. Lungs:  Clear, Bilateral. Cardiac:  Regular rhythm, normal S1 and S2, no S3. II/VI systolic murmur. Abdomen:  Soft, non-tender. BS present. Extremities:  Trace ankle edema present. No cyanosis. No clubbing. CNS: AxOx3, Cranial nerves grossly intact, moves all 4 extremities.  Skin: Warm and dry.   Intake/Output from previous day: 08/14 0701 - 08/15 0700 In: 306.6 [P.O.:120; I.V.:186.6] Out: 3880 [Urine:3880]    Lab Results: BMET    Component Value Date/Time   NA 132 (L) 06/15/2018 0410   NA 133 (L) 06/14/2018 1645   NA 131 (L) 06/14/2018 0357   K 3.1 (L) 06/15/2018 0410   K 3.9 06/14/2018 1645   K 4.2 06/14/2018 0357   CL 96 (L) 06/15/2018 0410   CL 94 (L) 06/14/2018 1645   CL 97 (L) 06/14/2018 0357   CO2 26 06/15/2018 0410   CO2 25 06/14/2018 0357   CO2 20 (L) 06/13/2018 0309   GLUCOSE 164 (H) 06/15/2018 0410   GLUCOSE 197 (H) 06/14/2018 1645   GLUCOSE 103 (H) 06/14/2018 0357   BUN 15 06/15/2018 0410   BUN 16 06/14/2018 1645   BUN 15 06/14/2018 0357   CREATININE 1.41 (H) 06/15/2018 0410   CREATININE 1.60 (H) 06/14/2018 1645   CREATININE 1.60 (H) 06/14/2018 0357   CREATININE 0.81 08/14/2014 1126   CREATININE 0.82 11/26/2013 1210   CALCIUM 7.9 (L) 06/15/2018 0410   CALCIUM 8.2 (L) 06/14/2018 0357   CALCIUM 7.6 (L) 06/13/2018 0309   GFRNONAA 53 (L) 06/15/2018 0410   GFRNONAA 45 (L) 06/14/2018 0357   GFRNONAA 43 (L) 06/13/2018 1715   GFRNONAA >89 08/14/2014 1126   GFRAA >60 06/15/2018 0410   GFRAA 52 (L) 06/14/2018 0357   GFRAA 50 (L) 06/13/2018 1715   GFRAA >89 08/14/2014 1126   CBC    Component Value Date/Time   WBC 12.2 (H) 06/15/2018 0410   RBC 3.29 (L) 06/15/2018 0410   HGB 9.8 (L) 06/15/2018 0410   HCT 30.4 (L) 06/15/2018 0410   PLT 207 06/15/2018 0410   MCV 92.4 06/15/2018 0410   MCH 29.8 06/15/2018 0410   MCHC 32.2 06/15/2018 0410   RDW 12.7 06/15/2018 0410   LYMPHSABS 4.8 (H) 06/03/2018 0442   MONOABS 1.3 (H) 06/03/2018 0442   EOSABS 0.3 06/03/2018 0442   BASOSABS 0.1 06/03/2018 0442   HEPATIC Function Panel Recent Labs    06/03/18 0442 06/08/18 0936 06/11/18 0413  PROT 7.2 8.0 7.7   HEMOGLOBIN A1C No components found for: HGA1C,  MPG CARDIAC ENZYMES Lab Results  Component Value Date   TROPONINI 0.25 (HH) 06/09/2018   TROPONINI 0.32 (HH) 06/08/2018   TROPONINI 0.31 (HH) 06/08/2018  BNP No results for input(s): PROBNP in the last 8760 hours. TSH Recent Labs    06/03/18 1414 06/10/18 0342  TSH 0.733 3.719   CHOLESTEROL Recent Labs    06/09/18 0450 06/10/18 0342  CHOL 166 148    Scheduled Meds: . acetaminophen  1,000 mg Oral Q6H   Or  . acetaminophen (TYLENOL) oral liquid 160 mg/5 mL  1,000 mg Per Tube Q6H  . aspirin EC  325 mg Oral Daily   Or  . aspirin  324 mg Per Tube Daily  . atorvastatin  40 mg Oral q1800  . bisacodyl  10 mg Oral Daily   Or  . bisacodyl  10 mg Rectal Daily  . Chlorhexidine Gluconate Cloth  6 each Topical Daily  . docusate sodium  200 mg Oral Daily  . [START ON 06/16/2018] furosemide  80 mg Intravenous Daily  . insulin aspart  0-24 Units Subcutaneous Q4H  . insulin aspart   4 Units Subcutaneous TID WC  . insulin detemir  15 Units Subcutaneous BID  . mouth rinse  15 mL Mouth Rinse BID  . metoCLOPramide (REGLAN) injection  10 mg Intravenous Q6H  . metoprolol tartrate  25 mg Oral BID   Or  . metoprolol tartrate  25 mg Per Tube BID  . mometasone-formoterol  2 puff Inhalation BID  . pantoprazole  40 mg Oral Daily  . potassium chloride  20 mEq Oral BID  . sodium chloride flush  10-40 mL Intracatheter Q12H  . sodium chloride flush  10-40 mL Intracatheter Q12H  . sodium chloride flush  3 mL Intravenous Q12H   Continuous Infusions: . sodium chloride Stopped (06/13/18 2001)  . sodium chloride    . sodium chloride 10 mL/hr (06/14/18 0509)  . milrinone 0.125 mcg/kg/min (06/15/18 0815)  . potassium chloride 10 mEq (06/15/18 0912)   PRN Meds:.sodium chloride, levalbuterol, metoprolol tartrate, morphine injection, ondansetron (ZOFRAN) IV, oxyCODONE, sodium chloride flush, sodium chloride flush, sodium chloride flush, traMADol  Assessment/Plan: CABG x 3 LM/3 vessel CAD Type 2 DM PVD Obesity Hypokalemia  Awaiting transfer to floor with increase activity. Potassium replacement. Check magnesium level in AM.   LOS: 7 days    Orpah CobbAjay Melah Ebling  MD  06/15/2018, 9:16 AM

## 2018-06-15 NOTE — Progress Notes (Signed)
Patient received from 2H A&Ox4, VSS. Telemetry applied and ccmd notified.

## 2018-06-15 NOTE — Progress Notes (Signed)
Inpatient Diabetes Program Recommendations  AACE/ADA: New Consensus Statement on Inpatient Glycemic Control (2019)  Target Ranges:  Prepandial:   less than 140 mg/dL      Peak postprandial:   less than 180 mg/dL (1-2 hours)      Critically ill patients:  140 - 180 mg/dL   Results for Robert Sanchez, Robert Sanchez (MRN [id number]) as of 06/15/2018 14:09  Ref. Range 06/14/2018 07:36 06/14/2018 11:40 06/14/2018 15:49 06/14/2018 20:06 06/14/2018 23:51 06/15/2018 04:17 06/15/2018 11:57  Glucose-Capillary Latest Ref Range: 70 - 99 mg/dL 91 201 (H) 196 (H) 166 (H) 105 (H) 177 (H) 166 (H)   Review of Glycemic Control  Diabetes history: DM2 Outpatient Diabetes medications: Metformin 1000 mg BID Current orders for Inpatient glycemic control: Levemir 15 units BID, Novolog 0-24 units Q4H, Novolog 4 units TID with meals for meal coverage  Inpatient Diabetes Program Recommendations: HgbA1C: A1C 12.4% on 06/12/18 indicating an average glucose of 309 mg/dl over the past 2-3 months. Patient will need to be discharged on affordable insulin for DM control.  NOTE: Patient was seen by Inpatient Diabetes Coordinator during last admission on 06/05/18 regarding DM control and insulin. Plan was for patient to be discharged on 70/30 insulin due to cost. However, patient left AMA on 06/06/18.  Patient has no insurance or PCP. Ordered (on 06/14/18)CM consult for medication assistance and follow up, insulin starter kit, patient education by bedside RNs on insulin and DM control, and diabetes education videos. BEDSIDE NURSING: Please use each patient interaction to provide diabetes education. Please instruct on insulin administration and allow patient to check own glucose and self-administer insulin injections.  Spoke with patient about diabetes and home regimen for diabetes control. Patient reports that he is followed by Urgent Care for diabetes management and currently he takes Metformin (off and on) as an outpatient for diabetes control.  Patient reports that he does not consistently take Metformin.  Patient does not have a working glucometer and testing supplies. Informed patient he could go to [facility] and purchase Reli-On glucometer for $9 and a box of 50 test strips for $9.  Inquired about prior A1C and patient reports that his A1C is usually in the 12-13% range. Discussed A1C results 12.4% on 06/12/18) and explained that his most current A1C indicates an average glucose of 309 mg/dl over the past 2-3 months. Discussed glucose and A1C goals. Discussed importance of checking CBGs and maintaining good CBG control to prevent long-term and short-term complications. Stressed importance of tight glycemic control following CABG.  Explained how hyperglycemia leads to damage within blood vessels which lead to the common complications seen with uncontrolled diabetes. Stressed to the patient the importance of improving glycemic control to prevent further complications from uncontrolled diabetes. Discussed impact of nutrition, exercise, stress, sickness, and medications on diabetes control. Discussed Levemir, Novolog, and 70/30 insulin. Patient states that he will need affordable insulin. Informed patient that NOVOLIN NPH, NOVOLIN Regular, and NOVOLIN 70/30 can be purchased at [facility] for $25 per vial and NOVOLIN 70/30 insulin pens were $43 per box.  Patient states that he would prefer to use insulin vial/syringe. Patient states that he has given his mother insulin injections several years ago. Reviewed and demonstrated how to draw up and administer insulin with vial and syringe. Patient was able to successfully demonstrate how to draw up and administer insulin with vial and syringe.  Encouraged patient to check his glucose 4 times per day (before meals and at bedtime) and to keep a log book of glucose  readings and insulin taken which he will need to take to doctor appointments. Explained how the doctor he follows up with can use the log book to continue to  make insulin adjustments if needed. Encouraged patient to establish care with PCP for consistent follow up. Informed patient that RNs will be asking him to self-administer insulin to ensure proper technique and ability to administer self insulin shots.   Patient verbalized understanding of information discussed and he states that he has no further questions at this time related to diabetes.   RNs to provide ongoing basic DM education at bedside with this patient and engage patient to actively check blood glucose and administer insulin injections.   Thanks, Barnie Alderman, RN, MSN, CDE Diabetes Coordinator Inpatient Diabetes Program [phone] (Team Pager)

## 2018-06-16 ENCOUNTER — Inpatient Hospital Stay (HOSPITAL_COMMUNITY): Payer: Self-pay

## 2018-06-16 DIAGNOSIS — M79609 Pain in unspecified limb: Secondary | ICD-10-CM

## 2018-06-16 DIAGNOSIS — I70222 Atherosclerosis of native arteries of extremities with rest pain, left leg: Secondary | ICD-10-CM

## 2018-06-16 LAB — BASIC METABOLIC PANEL
Anion gap: 8 (ref 5–15)
BUN: 16 mg/dL (ref 6–20)
CO2: 27 mmol/L (ref 22–32)
Calcium: 8 mg/dL — ABNORMAL LOW (ref 8.9–10.3)
Chloride: 101 mmol/L (ref 98–111)
Creatinine, Ser: 1.28 mg/dL — ABNORMAL HIGH (ref 0.61–1.24)
GFR calc Af Amer: >60 mL/min (ref >60)
GFR calc non Af Amer: 59 mL/min — ABNORMAL LOW (ref >60)
Glucose, Bld: 104 mg/dL — ABNORMAL HIGH (ref 70–99)
Potassium: 3.8 mmol/L (ref 3.5–5.1)
Sodium: 136 mmol/L (ref 135–145)

## 2018-06-16 LAB — GLUCOSE, CAPILLARY
GLUCOSE-CAPILLARY: 150 mg/dL — AB (ref 70–99)
GLUCOSE-CAPILLARY: 159 mg/dL — AB (ref 70–99)
GLUCOSE-CAPILLARY: 165 mg/dL — AB (ref 70–99)
Glucose-Capillary: 109 mg/dL — ABNORMAL HIGH (ref 70–99)
Glucose-Capillary: 140 mg/dL — ABNORMAL HIGH (ref 70–99)

## 2018-06-16 LAB — COOXEMETRY PANEL
Carboxyhemoglobin: 1.3 % (ref 0.5–1.5)
Methemoglobin: 1.7 % — ABNORMAL HIGH (ref 0.0–1.5)
O2 Saturation: 54.8 %
Total hemoglobin: 10.1 g/dL — ABNORMAL LOW (ref 12.0–16.0)

## 2018-06-16 LAB — CBC
HCT: 27.5 % — ABNORMAL LOW (ref 39.0–52.0)
Hemoglobin: 8.9 g/dL — ABNORMAL LOW (ref 13.0–17.0)
MCH: 30 pg (ref 26.0–34.0)
MCHC: 32.4 g/dL (ref 30.0–36.0)
MCV: 92.6 fL (ref 78.0–100.0)
Platelets: 285 10*3/uL (ref 150–400)
RBC: 2.97 MIL/uL — ABNORMAL LOW (ref 4.22–5.81)
RDW: 12.7 % (ref 11.5–15.5)
WBC: 11.7 10*3/uL — ABNORMAL HIGH (ref 4.0–10.5)

## 2018-06-16 LAB — MAGNESIUM: Magnesium: 2.2 mg/dL (ref 1.7–2.4)

## 2018-06-16 MED ORDER — CARVEDILOL 12.5 MG PO TABS
12.5000 mg | ORAL_TABLET | Freq: Two times a day (BID) | ORAL | Status: DC
  Administered 2018-06-16 – 2018-06-19 (×6): 12.5 mg via ORAL
  Filled 2018-06-16 (×6): qty 1

## 2018-06-16 MED ORDER — AMIODARONE HCL 200 MG PO TABS
400.0000 mg | ORAL_TABLET | Freq: Two times a day (BID) | ORAL | Status: DC
  Administered 2018-06-16 – 2018-06-19 (×7): 400 mg via ORAL
  Filled 2018-06-16 (×7): qty 2

## 2018-06-16 MED ORDER — METOPROLOL TARTRATE 5 MG/5ML IV SOLN: 2.5000 mg | INTRAVENOUS | Status: DC | PRN

## 2018-06-16 MED ORDER — FUROSEMIDE 40 MG PO TABS
40.0000 mg | ORAL_TABLET | Freq: Every day | ORAL | Status: DC
  Administered 2018-06-17 – 2018-06-19 (×3): 40 mg via ORAL
  Filled 2018-06-16 (×4): qty 1

## 2018-06-16 NOTE — Progress Notes (Signed)
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  Left: 30-49% stenosis noted in the deep femoral artery. Total occlusion noted in the superficial femoral artery mid. Total occlusion noted in the posterior tibial artery proximal.   Farrel DemarkJill Eunice, RDMS, RVT

## 2018-06-16 NOTE — Discharge Summary (Addendum)
Physician Discharge Summary       [address]       [place] [id number]             [phone]    Patient ID: Robert Sanchez MRN: [id number] DOB/AGE: Sep 15, 1956 62 y.o.  Admit date: 06/08/2018 Discharge date: 06/19/2018  Admission Diagnoses: 1. Acute coronary syndrome (HCC) 2. Ischemic cardiomyopathy 3. Coronary artery disease  Discharge Diagnoses:  1. S/P CABG x 3 2. ABL anemia 3. History of acute pulmonary edema (HCC) 4. History of diabetes mellitus without complication (HCC) 5. History of diabetic neuropathy (HCC) 6. History of tobacco abuse  Procedure (s):  Orpah CobbKadakia, Ajay, MD (Primary)    Procedures   RIGHT/LEFT HEART CATH AND CORONARY ANGIOGRAPHY on 06/05/2018:  Conclusion     Prox RCA to Mid RCA lesion is 100% stenosed.  Prox Cx lesion is 100% stenosed.  Dist LM lesion is 50% stenosed.  Ost LAD to Prox LAD lesion is 70% stenosed.  2nd Mrg lesion is 80% stenosed.  Ost RPDA to RPDA lesion is 80% stenosed.  Dist RCA lesion is 80% stenosed.  Post Atrio lesion is 100% stenosed.  Dist LAD lesion is 70% stenosed.  LV end diastolic pressure is normal.   CVTS consult for CABG post viability test. Lifestyle modification.    1.  Coronary artery bypass grafting x3 (left internal mammary artery to left anterior descending, saphenous vein graft to diagonal, saphenous vein graft to posterolateral branch of the right coronary. 2.  Endoscopic harvest of right leg greater saphenous vein by Dr. Donata ClayVan Trigt on 06/12/2018.  History of Presenting Illness: Mr. Buford Dresserhomas Sanchez is a 62 year old male patient with past medical history significant for Hypertension, Obesity, tobacco abuse, alcohol abuse, uncontrolled diabetes mellitus type 2 with history of DKA and chronic right leg neuropathy who presented to the emergency department with shortness of breath.  He shares that he woke up with acute shortness of breath and had significant difficulty catching his  breath.  He called 911 and was placed on CPAP in route to the ED.  A chest x-ray was obtained which showed likely pulmonary edema.  He denies chest pain, nausea, or recent illness.  He reports having pneumonia about a month ago and was taking an antibiotic but he cannot recall which one.  In the emergency department he was found to be in mild DKA.  His troponin was 0.16.  He was given Lasix, Ativan and nitroglycerin with improvement in his shortness of breath.  He did have sinus tachycardia with rates into the 120s.  Troponin peaked at 0.32.  Cardiology was consulted for further work-up.  Cardiac catheterization was obtained on 06/05/2018 which revealed multivessel coronary artery disease.  He was simultaneously being treated for mild DKA with a blood glucose level of 621 with an insulin drip.  His echocardiogram showed poor LV systolic function with an EF of 20 to 25%.  TCTS was consulted at this time however the patient left AMA before we were able to do an official consultation.  The patient returns to the emergency department on 06/08/2018 with a chief complaint of abdominal pain and chest and back pain.  We are being consulted for possible surgical revascularization. He did not receive cardiac MRI viability study but with anterior wall function only mildly reduced and with significant left main disease and total RCA and total circumflex, he will need surgical coronary revascularization.    Dr. Donata ClayVan Trigt discussed the procedure of CABG in detail  with the patient including the expected benefits expected recovery and potential risks including stroke death and infection.  Patient agreed to proceed with surgery. Patient has significant peripheral vascular disease with left leg ABI 0.3 with a traumatic claudication.  No significant carotid disease.  Pulmonary mechanics adequate for sternotomy.  Chest CT scan shows moderate COPD but no suspicious pulmonary mass or mediastinal adenopathy. He underwent a CABG x 3 on  06/12/2018.  Brief Hospital Course:  The patient was extubated the morning of post operative day one without difficulty. He initially needed Bipap but after diuresis, his pulmonary status improved. He remained afebrile and hemodynamically stable. He was weaned off Milrinone, Levophed, and Epinephrine drips. Theone Murdoch, a line, and chest tubes were removed early in the post operative course. Foley remained for a few days and then was removed. Lopressor was started and titrated accordingly. He was volume over loaded and diuresed. He had ABL anemia. He did not require a post op transfusion. Last H and H was 10.1/30.7. He was weaned off the insulin drip.  Once he was tolerating a diet, Metfromin was restarted.  The patient's glucose remained well controlled.The patient's HGA1C pre op was 12.4 . The patient was felt surgically stable for transfer from the ICU to [facility]U for further convalescence on 06/15/2018. He continues to progress with cardiac rehab. He/she was ambulating on room air. He has been tolerating a diet and has had a bowel movement. Epicardial pacing wires were removed on 06/18/2018. Chest tube sutures will be removed the day of discharge. The patient is felt surgically stable for discharge today.   Latest Vital Signs: Blood pressure 128/71, pulse 76, temperature 98.7 F (37.1 C), temperature source Oral, resp. rate 20, height 5\' 8"  (1.727 m), weight 94 kg, SpO2 95 %.  Physical Exam: General appearance: alert, cooperative and no distress Heart: regular rate and rhythm Lungs: clear to auscultation bilaterally Abdomen: soft, non-tender; bowel sounds normal; no masses,  no organomegaly Extremities: edema + pitting edema RLE Wound: clean and dry  Discharge Condition: Stable and discharged to Home  Recent laboratory studies:  Lab Results  Component Value Date   WBC 11.3 (H) 06/19/2018   HGB 10.1 (L) 06/19/2018   HCT 30.7 (L) 06/19/2018   MCV 92.7 06/19/2018   PLT 436 (H) 06/19/2018    Lab Results  Component Value Date   NA 135 06/19/2018   K 4.9 06/19/2018   CL 104 06/19/2018   CO2 24 06/19/2018   CREATININE 1.40 (H) 06/19/2018   GLUCOSE 148 (H) 06/19/2018      Diagnostic Studies: Dg Chest 2 View  Result Date: 06/16/2018 CLINICAL DATA:  CABG EXAM: CHEST - 2 VIEW COMPARISON:  06/15/2018 FINDINGS: Prior CABG. Cardiomegaly. Diffuse interstitial prominence throughout the lungs could reflect interstitial edema. More confluent opacity in the left lower lobe again noted, slightly improved. IMPRESSION: Diffuse interstitial prominence, question interstitial edema. Focal left lower lobe opacity could reflect pneumonia, slightly improved. Electronically Signed   By: Charlett Nose M.D.   On: 06/16/2018 09:22   Dg Chest 2 View  Result Date: 06/08/2018 CLINICAL DATA:  LEFT chest pain and abdominal pain since last night, hospitalized last week with possible MI and shortness of breath, history diabetes mellitus, smoker, pulmonary edema EXAM: CHEST - 2 VIEW COMPARISON:  06/04/2018 FINDINGS: Enlargement of cardiac silhouette with pulmonary vascular congestion. Distal contours stable. Improved interstitial edema. No new infiltrate, pleural effusion or pneumothorax. Scattered endplate spur formation thoracic spine. IMPRESSION: Improved pulmonary edema. Electronically Signed  By: Ulyses SouthwardMark  Boles M.D.   On: 06/08/2018 10:07   Ct Chest Without Contrast  Result Date: 06/09/2018 CLINICAL DATA:  Chest pain, shortness of breath. Patient admitted for CHF. Acute coronary syndrome. EXAM: CT CHEST WITHOUT CONTRAST TECHNIQUE: Multidetector CT imaging of the chest was performed following the standard protocol without IV contrast. COMPARISON:  No prior CT. Chest x-rays 06/08/2018 dating back to 06/03/2018. FINDINGS: Cardiovascular: Heart mildly enlarged. Mild mitral annular calcifications. Moderate aortic annular calcification. Severe three-vessel coronary atherosclerosis. Very small pericardial effusion. Mild  to moderate atherosclerosis involving the thoracic and proximal abdominal aorta without evidence of aneurysm. Mediastinum/Nodes: Numerous normal sized lymph nodes throughout the mediastinum. No pathologically enlarged mediastinal, hilar or axillary lymph nodes. No mediastinal masses. Normal-appearing esophagus. Normal-appearing thyroid gland. Lungs/Pleura: Emphysematous changes throughout both lungs. Resolution of previously identified pulmonary edema. In the LATERAL RIGHT LOWER LOBE adjacent to the major fissure there is a 6 x 4 mm nodule (5 mm mean diameter) on image 78 of series 8. Scattered smaller nodules are present in the RIGHT MIDDLE LOBE (image 78), RIGHT LOWER LOBE (image 87) and LEFT UPPER LOBE (image 78). Central airways patent with mild bronchial wall thickening. No pleural effusions. Upper Abdomen: Large stool burden in the visualized colon. Benign simple cyst arising from the mid LEFT kidney. Visualized upper abdomen otherwise unremarkable for the unenhanced technique. Musculoskeletal: Degenerative disc disease and spondylosis throughout the thoracic spine. No acute findings. IMPRESSION: 1. Resolution of previously identified pulmonary edema. No acute cardiopulmonary disease currently. 2. Multiple small BILATERAL lung nodules, the largest measuring approximately 5 mm in the LATERAL RIGHT LOWER LOBE adjacent to the major fissure. No follow-up needed if patient is low-risk (and has no known or suspected primary neoplasm). Non-contrast chest CT can be considered in 12 months if patient is high-risk. This recommendation follows the consensus statement: Guidelines for Management of Incidental Pulmonary Nodules Detected on CT Images: From the Fleischner Society 2017; Radiology 2017; 284:228-243. 3. Mild cardiomegaly.  Severe three-vessel coronary atherosclerosis. Aortic Atherosclerosis (ICD10-I70.0) and Emphysema (ICD10-J43.9). Electronically Signed   By: Hulan Saashomas  Lawrence M.D.   On: 06/09/2018 16:14   Nm  Myocar Multi W/spect W/wall Motion / Ef  Result Date: 06/09/2018 CLINICAL DATA:  62 year old male with chest pain EXAM: MYOCARDIAL IMAGING WITH SPECT (REST AND PHARMACOLOGIC-STRESS) GATED LEFT VENTRICULAR WALL MOTION STUDY LEFT VENTRICULAR EJECTION FRACTION TECHNIQUE: Standard myocardial SPECT imaging was performed after resting intravenous injection of 10 mCi Tc-8971m tetrofosmin. Subsequently, intravenous infusion of Lexiscan was performed under the supervision of the Cardiology staff. At peak effect of the drug, 30 mCi Tc-5671m tetrofosmin was injected intravenously and standard myocardial SPECT imaging was performed. Quantitative gated imaging was also performed to evaluate left ventricular wall motion, and estimate left ventricular ejection fraction. COMPARISON:  None. FINDINGS: Stress EKG: ST depression noted. Perfusion: Moderate region is severe decreased counts within the apical mid and basilar segment of the inferior wall which are fixed on rest and stress. No evidence reversible ischemia. Wall Motion: LEFT ventricular dilatation. Inferior wall hypokinesia. Left Ventricular Ejection Fraction: 29 % End diastolic volume 142 ml End systolic volume 101 ml IMPRESSION: 1. Inferior wall infarction.  No evidence reversible ischemia. 2. LEFT ventricular dilatation and inferior wall hypokinesia. 3. Left ventricular ejection fraction 29% 4. Non invasive risk stratification*: High (predominately based on low ejection fraction). *2012 Appropriate Use Criteria for Coronary Revascularization Focused Update: J Am Coll Cardiol. 2012;59(9):857-881. http://content.dementiazones.comonlinejacc.org/article.aspx?articleid=1201161 Electronically Signed   By: Genevive BiStewart  Edmunds M.D.   On: 06/09/2018 15:32  Dg Chest Port 1 View  Result Date: 06/15/2018 CLINICAL DATA:  Chest pain. EXAM: PORTABLE CHEST 1 VIEW COMPARISON:  June 14, 2018 FINDINGS: Cordis has been removed. There is now a peripherally inserted central catheter with the tip in the right  atrium, approximately 3 cm distal to the cavoatrial junction. No pneumothorax. There is persistent airspace consolidation throughout the left mid and lower lung zones as well as in the right base. There are small pleural effusions bilaterally. There is stable cardiomegaly with pulmonary vascularity normal. No adenopathy. No bone lesions. Status post coronary artery bypass grafting. IMPRESSION: Central catheter tip in right atrium. No pneumothorax. Stable cardiomegaly. Patchy infiltrate concerning for pneumonia in the left mid lower lung zones as well as in the right base. There is superimposed atelectatic change in these areas as well. Small pleural effusions noted. Electronically Signed   By: Bretta Bang III M.D.   On: 06/15/2018 08:42   Dg Chest Port 1 View  Result Date: 06/14/2018 CLINICAL DATA:  Chest pain status post coronary artery bypass graft. EXAM: PORTABLE CHEST 1 VIEW COMPARISON:  Radiograph of June 13, 2018. FINDINGS: Stable cardiomediastinal silhouette. Status post coronary bypass graft Swan-Ganz catheter has been removed. Right internal jugular venous sheath is noted. No pneumothorax is noted. Left-sided chest tube has been removed. Stable bibasilar atelectasis is noted with possible minimal pleural effusions. Bony thorax is unremarkable. IMPRESSION: No pneumothorax is seen status post left-sided chest tube removal. Stable bibasilar atelectasis is noted with possible minimal pleural effusions. Electronically Signed   By: Lupita Raider, M.D.   On: 06/14/2018 09:39   Dg Chest Port 1 View  Result Date: 06/13/2018 CLINICAL DATA:  Chest tube, post CABG EXAM: PORTABLE CHEST 1 VIEW COMPARISON:  06/12/2018 FINDINGS: Changes of CABG. Endotracheal tube has been removed as has the NG tube. Swan-Ganz catheter remains in the central right pulmonary artery, unchanged. Left chest tube stable. No pneumothorax. Cardiomegaly with vascular congestion and bibasilar atelectasis. IMPRESSION: Interval  extubation. Mild vascular congestion and bibasilar atelectasis. No pneumothorax. Electronically Signed   By: Charlett Nose M.D.   On: 06/13/2018 07:23   Dg Chest Port 1 View  Result Date: 06/12/2018 CLINICAL DATA:  Postop CABG EXAM: PORTABLE CHEST 1 VIEW COMPARISON:  06/08/2018 FINDINGS: Median sternotomy and CABG. Endotracheal tube tip 3 cm above the carina. Nasogastric tube enters the stomach. Right internal jugular Swan-Ganz catheter tip in the main pulmonary artery. Left chest tube is in place. No pneumothorax. Mild bibasilar atelectasis. IMPRESSION: Lines and tubes well positioned. No pneumothorax. Mild bibasilar atelectasis. Electronically Signed   By: Paulina Fusi M.D.   On: 06/12/2018 14:16   Dg Chest Portable 1 View  Result Date: 06/03/2018 CLINICAL DATA:  Respiratory distress. EXAM: PORTABLE CHEST 1 VIEW COMPARISON:  None. FINDINGS: Mild cardiomegaly. Normal mediastinal contours. Significant diffuse bilateral interstitial and bronchial thickening. Probable fluid in the right minor fissure. No pneumothorax. No acute osseous abnormalities. IMPRESSION: Mild cardiomegaly with diffuse interstitial and bronchial thickening suspicious for pulmonary edema, at least moderate in degree. Probable fluid in the right minor fissure. Electronically Signed   By: Rubye Oaks M.D.   On: 06/03/2018 05:39   Dg Chest Port 1v Same Day  Result Date: 06/04/2018 CLINICAL DATA:  CHF EXAM: PORTABLE CHEST 1 VIEW COMPARISON:  06/03/2018 FINDINGS: Cardiac shadow is stable. Diffuse changes of CHF are again identified and relatively stable given some variation in the technical factors of the image. No new focal abnormality is seen. IMPRESSION: Stable CHF. Electronically  Signed   By: Alcide Clever M.D.   On: 06/04/2018 15:36   [place] Ekg Site Rite  Result Date: 06/14/2018 If Site Rite image not attached, placement could not be confirmed due to current cardiac rhythm.      Discharge Instructions    Amb Referral to  Cardiac Rehabilitation   Complete by:  As directed    Diagnosis:  CABG   CABG X ___:  3     The patient has been discharged on:   1.Beta Blocker:  Yes [ x ]                              No   [   ]                              If No, reason:  2.Ace Inhibitor/ARB: Yes [   ]                                     No  [ x   ]                                     If No, reason: elevated creatinine  3.Statin:   Yes [ x  ]                  No  [   ]                  If No, reason:  4.Ecasa:  Yes  [x   ]                  No   [   ]                  If No, reason:  Discharge Medications: Allergies as of 06/19/2018      Reactions   Lisinopril Other (See Comments)   Syncope   Codeine Rash      Medication List    TAKE these medications   amiodarone 400 MG tablet Commonly known as:  PACERONE Take 1 tablet (400 mg total) by mouth 2 (two) times daily. For 7 days, then decrease to 400 mg daily   aspirin 325 MG EC tablet Take 1 tablet (325 mg total) by mouth daily. Start taking on:  06/20/2018   atorvastatin 40 MG tablet Commonly known as:  LIPITOR Take 1 tablet (40 mg total) by mouth daily at 6 PM.   carvedilol 12.5 MG tablet Commonly known as:  COREG Take 1 tablet (12.5 mg total) by mouth 2 (two) times daily with a meal.   furosemide 40 MG tablet Commonly known as:  LASIX Take 1 tablet (40 mg total) by mouth daily. Start taking on:  06/20/2018   metFORMIN 1000 MG tablet Commonly known as:  GLUCOPHAGE Take 1 tablet (1,000 mg total) by mouth 2 (two) times daily with a meal.   potassium chloride SA 20 MEQ tablet Commonly known as:  K-DUR,KLOR-CON Take 1 tablet (20 mEq total) by mouth daily.   traMADol 50 MG tablet Commonly known as:  ULTRAM Take 1 tablet (50 mg total) by mouth every 4 (four) hours as needed for moderate  pain.       Follow Up Appointments: Follow-up Information    Orpah Cobb, MD. Call.   Specialty:  Cardiology Why:  for a follow up appointment for  2 weeks  Contact information: [address] [place] [address] [phone]        Medical doctor Follow up.   Why:  Obtain either a medical doctor or endocrinologist for further diabetes management and surveillance of HGA1C 12.4       [facility] [place] Follow up on 06/23/2018.   Specialty:  Cardiothoracic Surgery Why:  Appointment is at 2:00, please get CXR at 1:30 at [facility] located on first floor of our office building Contact information: [address] [place], [address] [place] [address] [phone]          Signed: Carl Best 06/19/2018, 8:25 AM   patient examined and medical record reviewed,agree with above note. Kathlee Nations Trigt III 06/21/2018

## 2018-06-16 NOTE — Progress Notes (Signed)
Ref: Patient, No Pcp Per   Subjective:  Awake. Walking some. Left leg pain holds him back. Good diuresis.  Hyponatremia resolved. Magnesium level is normal. BUN/Creatinine is stable.  VS stable on 2 Litre oxygen.  Small dose milrinone drip continues. Non-sustained VT episode.  Objective:  Vital Signs in the last 24 hours: Temp:  [97.7 F (36.5 C)-98.9 F (37.2 C)] 97.7 F (36.5 C) (08/16 0833) Pulse Rate:  [80-99] 89 (08/16 0833) Cardiac Rhythm: Normal sinus rhythm (08/16 0700) Resp:  [16-24] 16 (08/16 0833) BP: (89-138)/(45-74) 112/63 (08/16 0833) SpO2:  [91 %-98 %] 95 % (08/16 0833) Weight:  [94.6 kg] 94.6 kg (08/16 0331)  Physical Exam: BP Readings from Last 1 Encounters:  06/16/18 112/63     Wt Readings from Last 1 Encounters:  06/16/18 94.6 kg    Weight change: -0.48 kg Body mass index is 31.72 kg/m. HEENT: [place]/AT, Eyes-Blue, PERL, EOMI, Conjunctiva-Pink, Sclera-Non-icteric Neck: No JVD, No bruit, Trachea midline. Lungs:  Clear, Bilateral. Midline scar healing well. Cardiac:  Regular rhythm, normal S1 and S2, no S3. II/VI systolic murmur. Abdomen:  Soft, non-tender. BS present. Extremities:  No edema present. No cyanosis. No clubbing. Left foot cooler than right. CNS: AxOx3, Cranial nerves grossly intact, moves all 4 extremities.  Skin: Warm and dry.   Intake/Output from previous day: 08/15 0701 - 08/16 0700 In: 864 [P.O.:600; I.V.:89.3; IV Piggyback:174.7] Out: 2025 [Urine:2025]    Lab Results: BMET    Component Value Date/Time   NA 136 06/16/2018 0340   NA 132 (L) 06/15/2018 0410   NA 133 (L) 06/14/2018 1645   K 3.8 06/16/2018 0340   K 3.1 (L) 06/15/2018 0410   K 3.9 06/14/2018 1645   CL 101 06/16/2018 0340   CL 96 (L) 06/15/2018 0410   CL 94 (L) 06/14/2018 1645   CO2 27 06/16/2018 0340   CO2 26 06/15/2018 0410   CO2 25 06/14/2018 0357   GLUCOSE 104 (H) 06/16/2018 0340   GLUCOSE 164 (H) 06/15/2018 0410   GLUCOSE 197 (H) 06/14/2018 1645   BUN 16  06/16/2018 0340   BUN 15 06/15/2018 0410   BUN 16 06/14/2018 1645   CREATININE 1.28 (H) 06/16/2018 0340   CREATININE 1.41 (H) 06/15/2018 0410   CREATININE 1.60 (H) 06/14/2018 1645   CREATININE 0.81 08/14/2014 1126   CREATININE 0.82 11/26/2013 1210   CALCIUM 8.0 (L) 06/16/2018 0340   CALCIUM 7.9 (L) 06/15/2018 0410   CALCIUM 8.2 (L) 06/14/2018 0357   GFRNONAA 59 (L) 06/16/2018 0340   GFRNONAA 53 (L) 06/15/2018 0410   GFRNONAA 45 (L) 06/14/2018 0357   GFRNONAA >89 08/14/2014 1126   GFRAA >60 06/16/2018 0340   GFRAA >60 06/15/2018 0410   GFRAA 52 (L) 06/14/2018 0357   GFRAA >89 08/14/2014 1126   CBC    Component Value Date/Time   WBC 11.7 (H) 06/16/2018 0340   RBC 2.97 (L) 06/16/2018 0340   HGB 8.9 (L) 06/16/2018 0340   HCT 27.5 (L) 06/16/2018 0340   PLT 285 06/16/2018 0340   MCV 92.6 06/16/2018 0340   MCH 30.0 06/16/2018 0340   MCHC 32.4 06/16/2018 0340   RDW 12.7 06/16/2018 0340   LYMPHSABS 4.8 (H) 06/03/2018 0442   MONOABS 1.3 (H) 06/03/2018 0442   EOSABS 0.3 06/03/2018 0442   BASOSABS 0.1 06/03/2018 0442   HEPATIC Function Panel Recent Labs    06/03/18 0442 06/08/18 0936 06/11/18 0413  PROT 7.2 8.0 7.7   HEMOGLOBIN A1C No components found for: HGA1C,  MPG CARDIAC ENZYMES Lab Results  Component Value Date   TROPONINI 0.25 (HH) 06/09/2018   TROPONINI 0.32 (HH) 06/08/2018   TROPONINI 0.31 (HH) 06/08/2018   BNP No results for input(s): PROBNP in the last 8760 hours. TSH Recent Labs    06/03/18 1414 06/10/18 0342  TSH 0.733 3.719   CHOLESTEROL Recent Labs    06/09/18 0450 06/10/18 0342  CHOL 166 148    Scheduled Meds: . acetaminophen  1,000 mg Oral Q6H   Or  . acetaminophen (TYLENOL) oral liquid 160 mg/5 mL  1,000 mg Per Tube Q6H  . aspirin EC  325 mg Oral Daily   Or  . aspirin  324 mg Per Tube Daily  . atorvastatin  40 mg Oral q1800  . bisacodyl  10 mg Oral Daily   Or  . bisacodyl  10 mg Rectal Daily  . Chlorhexidine Gluconate Cloth  6  each Topical Daily  . docusate sodium  200 mg Oral Daily  . furosemide  80 mg Intravenous Daily  . insulin aspart  0-24 Units Subcutaneous Q4H  . insulin aspart  4 Units Subcutaneous TID WC  . insulin detemir  15 Units Subcutaneous BID  . mouth rinse  15 mL Mouth Rinse BID  . metoCLOPramide (REGLAN) injection  10 mg Intravenous Q6H  . metoprolol tartrate  25 mg Oral BID   Or  . metoprolol tartrate  25 mg Per Tube BID  . mometasone-formoterol  2 puff Inhalation BID  . pantoprazole  40 mg Oral Daily  . potassium chloride  20 mEq Oral BID  . sodium chloride flush  10-40 mL Intracatheter Q12H  . sodium chloride flush  10-40 mL Intracatheter Q12H  . sodium chloride flush  3 mL Intravenous Q12H   Continuous Infusions: . sodium chloride Stopped (06/13/18 2001)  . sodium chloride    . sodium chloride 10 mL/hr at 06/15/18 2120  . milrinone 0.125 mcg/kg/min (06/16/18 0000)   PRN Meds:.sodium chloride, levalbuterol, metoprolol tartrate, morphine injection, ondansetron (ZOFRAN) IV, oxyCODONE, sodium chloride flush, sodium chloride flush, sodium chloride flush, traMADol  Assessment/Plan: CABG x 3, post op day 4 LM/3 vessel CAD Type 2 DM Severe PVD Obesity Hypokalemia-resolved  Change lasix to PO. Increase activity Lower extremities vascular surgery referral in 1 month. F/U office in 1 week.   LOS: 8 days    Orpah CobbAjay Holston Oyama  MD  06/16/2018, 9:41 AM

## 2018-06-16 NOTE — Evaluation (Signed)
Physical Therapy Evaluation & Discharge Patient Details Name: Robert Sanchez MRN: [id number] DOB: July 28, 1956 Today's Date: 06/16/2018   History of Present Illness  Pt is a 62 y.o. male admitted 06/08/18 with chest pain; now s/p CABG 8/12. Also with c/o L foot numbness/tingling; BLE arterial duplex 8/16 shows total occlusion in R superficial femoral artery and L posterior tibial artery. PMH includes DM, PE, neuropathy, arthritis, spine sx.    Clinical Impression  Patient evaluated by Physical Therapy with no further acute PT needs identified. PTA, pt indep and works; has family/friends available for PRN support. Pt currently indep with mobility and ADLs. Continues to c/o "pins and needles" sensation in L foot. Reviewed sternal precautions and encouraged continued ambulation. Pt also reporting desire to quit smoking. All education has been completed and the patient has no further questions. PT is signing off. Thank you for this referral.    Follow Up Recommendations No PT follow up;Supervision - Intermittent    Equipment Recommendations  None recommended by PT    Recommendations for Other Services       Precautions / Restrictions Precautions Precautions: Sternal Precaution Comments: Verbally reviewed precautions Restrictions Weight Bearing Restrictions: (Sternal)      Mobility  Bed Mobility               General bed mobility comments: Received sitting in recliner  Transfers Overall transfer level: Independent               General transfer comment: Able to stand without UE support  Ambulation/Gait Ambulation/Gait assistance: Independent Gait Distance (Feet): 400 Feet Assistive device: None;IV Pole Gait Pattern/deviations: Step-through pattern;Decreased stride length Gait velocity: Decreased Gait velocity interpretation: 1.31 - 2.62 ft/sec, indicative of limited community ambulator General Gait Details: Indep walking with and without pushing IV pole  Stairs             Wheelchair Mobility    Modified Rankin (Stroke Patients Only)       Balance Overall balance assessment: Needs assistance   Sitting balance-Leahy Scale: Good       Standing balance-Leahy Scale: Good               High level balance activites: Side stepping;Backward walking;Direction changes;Turns;Head turns;Sudden stops High Level Balance Comments: No overt instability or LOB with higher level balance activities             Pertinent Vitals/Pain Pain Assessment: Faces Faces Pain Scale: Hurts a little bit Pain Location: Sternal incision (sore); L foot (tingling) Pain Descriptors / Indicators: Sore Pain Intervention(s): Monitored during session    Home Living Family/patient expects to be discharged to:: Private residence Living Arrangements: Alone Available Help at Discharge: Family;Friend(s);Available PRN/intermittently Type of Home: House Home Access: Stairs to enter     Home Layout: One level        Prior Function Level of Independence: Independent         Comments: Works as Merchandiser, retailplumber     Hand Dominance        Extremity/Trunk Assessment   Upper Extremity Assessment Upper Extremity Assessment: Overall WFL for tasks assessed(with limits of sternal precautions)    Lower Extremity Assessment Lower Extremity Assessment: LLE deficits/detail LLE Deficits / Details: pt reports "pins and needles" throughout L distal foot LLE Sensation: decreased light touch    Cervical / Trunk Assessment Cervical / Trunk Assessment: Normal  Communication   Communication: No difficulties  Cognition Arousal/Alertness: Awake/alert Behavior During Therapy: WFL for tasks assessed/performed Overall Cognitive Status: Within  Functional Limits for tasks assessed                                        General Comments      Exercises     Assessment/Plan    PT Assessment Patent does not need any further PT services  PT Problem List          PT Treatment Interventions      PT Goals (Current goals can be found in the Care Plan section)  Acute Rehab PT Goals PT Goal Formulation: All assessment and education complete, DC therapy    Frequency     Barriers to discharge        Co-evaluation               AM-PAC PT "6 Clicks" Daily Activity  Outcome Measure Difficulty turning over in bed (including adjusting bedclothes, sheets and blankets)?: None Difficulty moving from lying on back to sitting on the side of the bed? : None Difficulty sitting down on and standing up from a chair with arms (e.g., wheelchair, bedside commode, etc,.)?: None Help needed moving to and from a bed to chair (including a wheelchair)?: None Help needed walking in hospital room?: None Help needed climbing 3-5 steps with a railing? : None 6 Click Score: 24    End of Session Equipment Utilized During Treatment: Gait belt Activity Tolerance: Patient tolerated treatment well Patient left: in chair Nurse Communication: Mobility status(Indep) PT Visit Diagnosis: Other abnormalities of gait and mobility (R26.89)    Time: [id number] PT Time Calculation (min) (ACUTE ONLY): 18 min   Charges:   PT Evaluation $PT Eval Moderate Complexity: 1 Mod         Robert Sanchez, PT, DPT Acute Rehab Services  Pager: [phone]  Robert Sanchez 06/16/2018, 5:05 PM

## 2018-06-16 NOTE — Discharge Instructions (Signed)
Coronary Artery Bypass Grafting, Care After °These instructions give you information on caring for yourself after your procedure. Your doctor may also give you more specific instructions. Call your doctor if you have any problems or questions after your procedure. °Follow these instructions at home: °· Only take medicine as told by your doctor. Take medicines exactly as told. Do not stop taking medicines or start any new medicines without talking to your doctor first. °· Take your pulse as told by your doctor. °· Do deep breathing as told by your doctor. Use your breathing device (incentive spirometer), if given, to practice deep breathing several times a day. Support your chest with a pillow or your arms when you take deep breaths or cough. °· Keep the area clean, dry, and protected where the surgery cuts (incisions) were made. Remove bandages (dressings) only as told by your doctor. If strips were applied to surgical area, do not take them off. They fall off on their own. °· Check the surgery area daily for puffiness (swelling), redness, or leaking fluid. °· If surgery cuts were made in your legs: °? Avoid crossing your legs. °? Avoid sitting for long periods of time. Change positions every 30 minutes. °? Raise your legs when you are sitting. Place them on pillows. °· Wear stockings that help keep blood clots from forming in your legs (compression stockings). °· Only take sponge baths until your doctor says it is okay to take showers. Pat the surgery area dry. Do not rub the surgery area with a washcloth or towel. Do not bathe, swim, or use a hot tub until your doctor says it is okay. °· Eat foods that are high in fiber. These include raw fruits and vegetables, whole grains, beans, and nuts. Choose lean meats. Avoid canned, processed, and fried foods. °· Drink enough fluids to keep your pee (urine) clear or pale yellow. °· Weigh yourself every day. °· Rest and limit activity as told by your doctor. You may be told  to: °? Stop any activity if you have chest pain, shortness of breath, changes in heartbeat, or dizziness. Get help right away if this happens. °? Move around often for short amounts of time or take short walks as told by your doctor. Gradually become more active. You may need help to strengthen your muscles and build endurance. °? Avoid lifting, pushing, or pulling anything heavier than 10 pounds (4.5 kg) for at least 6 weeks after surgery. °· Do not drive until your doctor says it is okay. °· Ask your doctor when you can go back to work. °· Ask your doctor when you can begin sexual activity again. °· Follow up with your doctor as told. °Contact a doctor if: °· You have puffiness, redness, more pain, or fluid draining from the incision site. °· You have a fever. °· You have puffiness in your ankles or legs. °· You have pain in your legs. °· You gain 2 or more pounds (0.9 kg) a day. °· You feel sick to your stomach (nauseous) or throw up (vomit). °· You have watery poop (diarrhea). °Get help right away if: °· You have chest pain that goes to your jaw or arms. °· You have shortness of breath. °· You have a fast or irregular heartbeat. °· You notice a "clicking" in your breastbone when you move. °· You have numbness or weakness in your arms or legs. °· You feel dizzy or light-headed. °This information is not intended to replace advice given to you by   your health care provider. Make sure you discuss any questions you have with your health care provider. °Document Released: 10/23/2013 Document Revised: 03/25/2016 Document Reviewed: 03/27/2013 °Elsevier Interactive Patient Education © 2017 [employer] ° °

## 2018-06-16 NOTE — Progress Notes (Signed)
CCMD notified RN pt with 10 beat V-TAC. Strip printed and Zackery Barefootressa Conte PA at bedside made aware. No new orders. Will continue to monitor. Francee Piccoloerek RCharity fundraiser

## 2018-06-16 NOTE — Consult Note (Addendum)
[facility] NOTE   MRN : [id number]  Reason for Consult: left toe rest pain  Referring Physician: Dr. Morton Peters  History of Present Illness: 62 y/o male s/p CABG with reported left toe pain and feeling of ice cold sensation.  He states this is a new symptom since his heart bypass surgery.  He has a history of right LE peripheral neuropathy.  He denise non healing ulcers, loss of sensation, loss of motor, or reproducible calf pain with ambulation.     Past medical history DM, CAD POD # 4 CABG, SOB,  CXR showed likely pulmonary edema in the ED.  Home medication to date Metformin.       Current Facility-Administered Medications  Medication Dose Route Frequency Provider Last Rate Last Dose  . 0.45 % sodium chloride infusion   Intravenous Continuous PRN Sharlene Dory, PA-C   Stopped at 06/13/18 2001  . 0.9 %  sodium chloride infusion  250 mL Intravenous Continuous Conte, Tessa N, PA-C      . 0.9 %  sodium chloride infusion   Intravenous Continuous Sharlene Dory, PA-C 10 mL/hr at 06/15/18 2120    . acetaminophen (TYLENOL) tablet 1,000 mg  1,000 mg Oral Q6H Asa Lente, Tessa N, PA-C   1,000 mg at 06/16/18 1157   Or  . acetaminophen (TYLENOL) solution 1,000 mg  1,000 mg Per Tube Q6H Conte, Tessa N, PA-C      . amiodarone (PACERONE) tablet 400 mg  400 mg Oral BID Kerin Perna, MD      . aspirin EC tablet 325 mg  325 mg Oral Daily Jari Favre N, [place]   325 mg at 06/16/18 1011   Or  . aspirin chewable tablet 324 mg  324 mg Per Tube Daily Conte, Tessa N, PA-C      . atorvastatin (LIPITOR) tablet 40 mg  40 mg Oral q1800 Conte, Tessa N, PA-C   40 mg at 06/15/18 1731  . bisacodyl (DULCOLAX) EC tablet 10 mg  10 mg Oral Daily Jari Favre N, PA-C   10 mg at 06/15/18 4696   Or  . bisacodyl (DULCOLAX) suppository 10 mg  10 mg Rectal Daily Asa Lente, Tessa N, PA-C   10 mg at 06/16/18 1011  . carvedilol (COREG) tablet 12.5 mg  12.5 mg Oral BID WC Donata Clay, Theron Arista, MD       . Chlorhexidine Gluconate Cloth 2 % PADS 6 each  6 each Topical Daily Kerin Perna, MD   6 each at 06/15/18 1652  . docusate sodium (COLACE) capsule 200 mg  200 mg Oral Daily Conte, Tessa N, PA-C   200 mg at 06/16/18 1010  . [START ON 06/17/2018] furosemide (LASIX) tablet 40 mg  40 mg Oral Daily Orpah Cobb, MD      . insulin aspart (novoLOG) injection 0-24 Units  0-24 Units Subcutaneous Q4H Kerin Perna, MD   2 Units at 06/16/18 1157  . insulin aspart (novoLOG) injection 4 Units  4 Units Subcutaneous TID WC Kerin Perna, MD   4 Units at 06/16/18 1201  . insulin detemir (LEVEMIR) injection 15 Units  15 Units Subcutaneous BID Kerin Perna, MD   15 Units at 06/16/18 1013  . levalbuterol (XOPENEX) nebulizer solution 0.63 mg  0.63 mg Nebulization Q6H PRN Kerin Perna, MD      . MEDLINE mouth rinse  15 mL Mouth Rinse BID Kerin Perna, MD   15 mL at 06/15/18  2114  . metoCLOPramide (REGLAN) injection 10 mg  10 mg Intravenous Q6H Kerin PernaVan Trigt, Peter, MD   10 mg at 06/16/18 1158  . metoprolol tartrate (LOPRESSOR) injection 2.5-5 mg  2.5-5 mg Intravenous Q5 min PRN Conte, Tessa N, PA-C      . milrinone (PRIMACOR) 20 MG/100 ML (0.2 mg/mL) infusion  0.125 mcg/kg/min Intravenous Continuous Donata ClayVan Trigt, Theron AristaPeter, MD 3.6 mL/hr at 06/16/18 0000 0.125 mcg/kg/min at 06/16/18 0000  . mometasone-formoterol (DULERA) 200-5 MCG/ACT inhaler 2 puff  2 puff Inhalation BID Donata ClayVan Trigt, Theron AristaPeter, MD   2 puff at 06/16/18 [phone]  . morphine 2 MG/ML injection 2-5 mg  2-5 mg Intravenous Q1H PRN Jari Favreonte, Tessa N, PA-C   4 mg at 06/13/18 1338  . ondansetron (ZOFRAN) injection 4 mg  4 mg Intravenous Q6H PRN Asa Lenteonte, Tessa N, PA-C      . oxyCODONE (Oxy IR/ROXICODONE) immediate release tablet 5-10 mg  5-10 mg Oral Q3H PRN Asa Lenteonte, Tessa N, PA-C   10 mg at 06/14/18 1149  . pantoprazole (PROTONIX) EC tablet 40 mg  40 mg Oral Daily Jari FavreConte, Tessa N, PA-C   40 mg at 06/16/18 1011  . potassium chloride SA (K-DUR,KLOR-CON) CR tablet 20  mEq  20 mEq Oral BID Donata ClayVan Trigt, Theron AristaPeter, MD   20 mEq at 06/16/18 1010  . sodium chloride flush (NS) 0.9 % injection 10-40 mL  10-40 mL Intracatheter Q12H Kerin PernaVan Trigt, Peter, MD   10 mL at 06/13/18 2107  . sodium chloride flush (NS) 0.9 % injection 10-40 mL  10-40 mL Intracatheter PRN Donata ClayVan Trigt, Theron AristaPeter, MD      . sodium chloride flush (NS) 0.9 % injection 10-40 mL  10-40 mL Intracatheter Q12H Kerin PernaVan Trigt, Peter, MD   10 mL at 06/15/18 2115  . sodium chloride flush (NS) 0.9 % injection 10-40 mL  10-40 mL Intracatheter PRN Donata ClayVan Trigt, Theron AristaPeter, MD      . sodium chloride flush (NS) 0.9 % injection 3 mL  3 mL Intravenous Q12H Conte, Tessa N, PA-C   3 mL at 06/14/18 0930  . sodium chloride flush (NS) 0.9 % injection 3 mL  3 mL Intravenous PRN Conte, Tessa N, PA-C      . traMADol Janean Sark(ULTRAM) tablet 50-100 mg  50-100 mg Oral Q4H PRN Asa Lenteonte, Tessa N, PA-C   100 mg at 06/13/18 [id number]    Pt meds include: Statin :No Betablocker: No ASA: No Other anticoagulants/antiplatelets: none  Past Medical History:  Diagnosis Date  . Acute pulmonary edema (HCC) 06/05/2018  . Arthritis   . Diabetes mellitus without complication (HCC)   . Diabetic neuropathy [facility](HCC)     Past Surgical History:  Procedure Laterality Date  . CORONARY ARTERY BYPASS GRAFT N/A 06/12/2018   Procedure: CORONARY ARTERY BYPASS GRAFTING (CABG) x 3 WITH ENDOSCOPIC HARVESTING OF RIGHT GREATER SAPHENOUS VEIN. LIMA TO LAD. SVG TO PD. SVG TO DIAGONAL.;  Surgeon: Kerin PernaVan Trigt, Peter, MD;  [facility];  Service: Open Heart Surgery;  Laterality: N/A;  . KNEE ARTHROSCOPY    . RIGHT/LEFT HEART CATH AND CORONARY ANGIOGRAPHY N/A 06/05/2018   Procedure: RIGHT/LEFT HEART CATH AND CORONARY ANGIOGRAPHY;  Surgeon: Orpah CobbKadakia, Ajay, MD;  Location: [facility] INVASIVE CV LAB;  Service: Cardiovascular;  Laterality: N/A;  . SPINE SURGERY    . TEE WITHOUT CARDIOVERSION N/A 06/12/2018   Procedure: TRANSESOPHAGEAL ECHOCARDIOGRAM (TEE);  Surgeon: Donata ClayVan Trigt, Theron AristaPeter, MD;  [facility];   Service: Open Heart Surgery;  Laterality: N/A;    Social History Social History  Tobacco Use  . Smoking status: Current Every Day Smoker    Packs/day: 1.00    Years: 40.00    Pack years: 40.00  . Smokeless tobacco: Never Used  Substance Use Topics  . Alcohol use: Yes  . Drug use: No    Family History Family History  Problem Relation Age of Onset  . Diabetes Mother   . Cancer Father   . Heart disease Father     Allergies  Allergen Reactions  . Lisinopril Other (See Comments)    Syncope  . Codeine Rash     REVIEW OF SYSTEMS  General: [ ]  Weight loss, [ ]  Fever, [ ]  chills Neurologic: [ ]  Dizziness, [ ]  Blackouts, [ ]  Seizure [ ]  Stroke, [ ]  "Mini stroke", [ ]  Slurred speech, [ ]  Temporary blindness; [ ]  weakness in arms or legs, [ ]  Hoarseness [ ]  Dysphagia Cardiac: [x ] Chest pain/pressure, x[ ]  Shortness of breath at rest [ ]  Shortness of breath with exertion, [ ]  Atrial fibrillation or irregular heartbeat  Vascular: [ ]  Pain in legs with walking, [ ]  Pain in legs at rest, [ ]  Pain in legs at night,  [ ]  Non-healing ulcer, [ ]  Blood clot in vein/DVT,   Pulmonary: [ ]  Home oxygen, [ ]  Productive cough, [ ]  Coughing up blood, [ ]  Asthma,  [ ]  Wheezing [ ]  COPD Musculoskeletal:  [ ]  Arthritis, [ ]  Low back pain, [ ]  Joint pain Hematologic: [ ]  Easy Bruising, [ ]  Anemia; [ ]  Hepatitis Gastrointestinal: [ ]  Blood in stool, [ ]  Gastroesophageal Reflux/heartburn, Urinary: [ ]  chronic Kidney disease, [ ]  on HD - [ ]  MWF or [ ]  TTHS, [ ]  Burning with urination, [ ]  Difficulty urinating Skin: [ ]  Rashes, [ ]  Wounds Psychological: [ ]  Anxiety, [ ]  Depression  Physical Examination Vitals:   06/16/18 0331 06/16/18 0821 06/16/18 0833 06/16/18 1256  BP: 129/73  112/63 114/63  Pulse: 80  89 82  Resp: 16  16 20   Temp: 98.3 F (36.8 C)  97.7 F (36.5 C) (!) 97.4 F (36.3 C)  TempSrc: Oral  Oral Oral  SpO2: 97% 93% 95% 95%  Weight: 94.6 kg     Height:       Body mass  index is 31.72 kg/m.  General:  WDWN in NAD Gait: not observed HENT: WNL, normocephalic Eyes: Pupils equal Pulmonary: normal non-labored breathing Cardiac: RRR, without  Murmurs, rubs or gallops;, well healing chest incision No carotid bruits Abdomen: soft, NT, no masses Skin: no rashes, ulcers noted;  no Gangrene , no cellulitis; no open wounds;   Vascular Exam/Pulses:palpable femoral pulses B, doppler DP/PT right > left signals   Musculoskeletal: no muscle wasting or atrophy; minimal edema  Neurologic: A&O X 3; Appropriate Affect ;  SENSATION: normal; MOTOR FUNCTION: grossly 5/5 Symmetric Speech is fluent/normal   Significant Diagnostic Studies: CBC Lab Results  Component Value Date   WBC 11.7 (H) 06/16/2018   HGB 8.9 (L) 06/16/2018   HCT 27.5 (L) 06/16/2018   MCV 92.6 06/16/2018   PLT 285 06/16/2018    BMET    Component Value Date/Time   NA 136 06/16/2018 0340   K 3.8 06/16/2018 0340   CL 101 06/16/2018 0340   CO2 27 06/16/2018 0340   GLUCOSE 104 (H) 06/16/2018 0340   BUN 16 06/16/2018 0340   CREATININE 1.28 (H) 06/16/2018 0340   CREATININE 0.81 08/14/2014 1126   CALCIUM 8.0 (L) 06/16/2018 0340  GFRNONAA 59 (L) 06/16/2018 0340   GFRNONAA >89 08/14/2014 1126   GFRAA >60 06/16/2018 0340   GFRAA >89 08/14/2014 1126   Estimated Creatinine Clearance: 68.5 mL/min (A) (by C-G formula based on SCr of 1.28 mg/dL (H)).  COAG Lab Results  Component Value Date   INR 1.45 06/12/2018   INR 1.14 06/10/2018   INR 1.23 06/09/2018     Non-Invasive Vascular Imaging:  ABI  Left 0.38 Right 0.71  Carotid duplex Right Carotid: Velocities in the right ICA are consistent with a 1-39% stenosis.  Left Carotid: Velocities in the left ICA are consistent with a 1-39% stenosis.  ASSESSMENT/PLAN:  PAD with infrainguinal disease  He has palpable femoral pulses bilaterally.  He has active range of motion of the the left foot.  He states he has ambulated in the halls  multiple times.  He is not at risk for limb loss and has good doppler signals.  There are no non healing wounds on the left foot.    We will order an arterial duplex.  Pending further work up and plan for angiogram with run off bilaterally LE with possible intervention.  If he is discharged we can schedule this as an out patient on Wednesday 06/21/2018.      Mosetta Pigeon 06/16/2018 1:34 PM   I have seen and evaluated the patient. I agree with the PA note as documented above.  Describes one month of rest pain in left foot, ABI 0.3 vs 0.7 right foot.  Biphasic DP/PT signals in left foot with no tissue loss.  One week post-op s/p CABG.  Will obtain arterial duplex of left leg.  Likely plan for outpatient arteriogram this week.  Cephus Shelling, MD [facility] Office: [phone] Pager: [phone]

## 2018-06-16 NOTE — Progress Notes (Signed)
CARDIAC REHAB PHASE I   PRE:  Rate/Rhythm: 83 SR    BP: sitting 124/67    SaO2: 96 2 1/2L  MODE:  Ambulation: 390 ft   POST:  Rate/Rhythm: 98 SR    BP: sitting 140/88     SaO2: 88, 93 RA  Pt moving well. Able to walk without assist in hall, slow pace. Only c/o is numb left foot. Not unsteady. SaO2 on RA 88 and greater. Left off O2 in room in recliner, 91 RA. Discussed smoking cessation. Pt is motivated to quit smoking. Gave him fake cigarette, which he is smoking now. Left education materials. Encouraged IS and flutter. He does not like IS, 750 mL. Encouraged x2 more walks today. Will f/u tomorrow.  [phone]  Harriet MassonRandi Kristan Tallon Gertz CES, ACSM 06/16/2018 2:18 PM

## 2018-06-16 NOTE — Progress Notes (Signed)
CCMD notified RN of 19 beat V-TAC. Pt asymptomatic. Jari Favreessa Conte PA notified. Order to give prn Lopressor and EKG if happens again. Will continue to montitor. Francee Piccoloerek RCharity fundraiser

## 2018-06-16 NOTE — Progress Notes (Addendum)
[address]       [place] [id number]             [phone]      4 Days Post-Op Procedure(s) (LRB): CORONARY ARTERY BYPASS GRAFTING (CABG) x 3 WITH ENDOSCOPIC HARVESTING OF RIGHT GREATER SAPHENOUS VEIN. LIMA TO LAD. SVG TO PD. SVG TO DIAGONAL. (N/A) TRANSESOPHAGEAL ECHOCARDIOGRAM (TEE) (N/A) Subjective: Shares that his left foot feels like its in a bucket of ice and is being stuck by pins and needles  Objective: Vital signs in last 24 hours: Temp:  [97.7 F (36.5 C)-98.9 F (37.2 C)] 97.7 F (36.5 C) (08/16 0833) Pulse Rate:  [80-99] 89 (08/16 0833) Cardiac Rhythm: Normal sinus rhythm (08/16 0700) Resp:  [16-24] 16 (08/16 0833) BP: (89-138)/(45-74) 112/63 (08/16 0833) SpO2:  [91 %-98 %] 95 % (08/16 0833) Weight:  [94.6 kg] 94.6 kg (08/16 0331)     Intake/Output from previous day: 08/15 0701 - 08/16 0700 In: 864 [P.O.:600; I.V.:89.3; IV Piggyback:174.7] Out: 2025 [Urine:2025] Intake/Output this shift: No intake/output data recorded.  General appearance: alert, cooperative and no distress Heart: regular rate and rhythm, S1, S2 normal, no murmur, click, rub or gallop Lungs: clear to auscultation bilaterally Abdomen: soft, non-tender; bowel sounds normal; no masses,  no organomegaly Extremities: extremities normal, atraumatic, no cyanosis or edema Wound: clean and dry  Lab Results: Recent Labs    06/15/18 0410 06/16/18 0340  WBC 12.2* 11.7*  HGB 9.8* 8.9*  HCT 30.4* 27.5*  PLT 207 285   BMET:  Recent Labs    06/15/18 0410 06/16/18 0340  NA 132* 136  K 3.1* 3.8  CL 96* 101  CO2 26 27  GLUCOSE 164* 104*  BUN 15 16  CREATININE 1.41* 1.28*  CALCIUM 7.9* 8.0*    PT/INR: No results for input(s): LABPROT, INR in the last 72 hours. ABG    Component Value Date/Time   PHART 7.382 06/13/2018 1000   HCO3 20.9 06/13/2018 1000   TCO2 24 06/14/2018 1645   ACIDBASEDEF 3.4 (H) 06/13/2018 1000   O2SAT 54.8 06/16/2018 0340   CBG (last 3)    Recent Labs    06/15/18 2332 06/16/18 0327 06/16/18 0830  GLUCAP 113* 109* 140*    Assessment/Plan: S/P Procedure(s) (LRB): CORONARY ARTERY BYPASS GRAFTING (CABG) x 3 WITH ENDOSCOPIC HARVESTING OF RIGHT GREATER SAPHENOUS VEIN. LIMA TO LAD. SVG TO PD. SVG TO DIAGONAL. (N/A) TRANSESOPHAGEAL ECHOCARDIOGRAM (TEE) (N/A)  1. CV-NSR in the 80s, BP well controlled. Continue ASA, lipitor, lopressor 25mg  BID, on milrinone 0.18625mcg/kg/min. coox 54.8 down from 65.2 2. Pulm-tolerating 3L [place] with good oxygenation. CXR appears stable, no obvious pneumothorax and no pleural effusions.  3. Renal-creatinine 1.28, electrolyte okay. Volume overload. Continue Lasix 80mg  IV daily. Remains about 8kg fluid overloaded.  4. H and H 8.9/27.5, platelets 285k-expected acute blood loss anemia 5. Endo-blood glucose level well controlled on current regimen  Plan: May consider vascular consult-history of severe PAD. Consult to PT for possible rehab needs. Wean oxygen as tolerated. Will likely continue milrinone one more day. He did have an episode of NSVT this morning-continue BB. Pain is well controlled. Encouraged ambulation and incentive spirometer use.    LOS: 8 days    Sharlene Doryessa N Conte 06/16/2018 Weaning oxygen with preoperative COPD, active smoking Cannot tolerate high-dose beta-blocker due to lung disease Maintaining sinus rhythm Increased rest pain left foot with preoperative ABI of 0.3-will ask for VVS consultation Continue low-dose milrinone with co-ox dropped to 54%.  Patient has severe ischemic cardiomyopathy with low EF Patient with poor home social situation - keep  in hospital through the weekend  patient examined and medical record reviewed,agree with above note. Kathlee Nationseter Van Trigt III 06/16/2018

## 2018-06-17 LAB — BASIC METABOLIC PANEL
Anion gap: 9 (ref 5–15)
BUN: 13 mg/dL (ref 6–20)
CO2: 28 mmol/L (ref 22–32)
Calcium: 7.6 mg/dL — ABNORMAL LOW (ref 8.9–10.3)
Chloride: 98 mmol/L (ref 98–111)
Creatinine, Ser: 1.13 mg/dL (ref 0.61–1.24)
GFR calc Af Amer: >60 mL/min (ref >60)
GFR calc non Af Amer: >60 mL/min (ref >60)
Glucose, Bld: 137 mg/dL — ABNORMAL HIGH (ref 70–99)
Potassium: 4.5 mmol/L (ref 3.5–5.1)
Sodium: 135 mmol/L (ref 135–145)

## 2018-06-17 LAB — CBC
HCT: 29.9 % — ABNORMAL LOW (ref 39.0–52.0)
Hemoglobin: 9.6 g/dL — ABNORMAL LOW (ref 13.0–17.0)
MCH: 30 pg (ref 26.0–34.0)
MCHC: 32.1 g/dL (ref 30.0–36.0)
MCV: 93.4 fL (ref 78.0–100.0)
Platelets: 344 10*3/uL (ref 150–400)
RBC: 3.2 MIL/uL — ABNORMAL LOW (ref 4.22–5.81)
RDW: 12.6 % (ref 11.5–15.5)
WBC: 9.9 10*3/uL (ref 4.0–10.5)

## 2018-06-17 LAB — GLUCOSE, CAPILLARY
GLUCOSE-CAPILLARY: 138 mg/dL — AB (ref 70–99)
GLUCOSE-CAPILLARY: 142 mg/dL — AB (ref 70–99)
GLUCOSE-CAPILLARY: 151 mg/dL — AB (ref 70–99)
Glucose-Capillary: 132 mg/dL — ABNORMAL HIGH (ref 70–99)
Glucose-Capillary: 107 mg/dL — ABNORMAL HIGH (ref 70–99)
Glucose-Capillary: 201 mg/dL — ABNORMAL HIGH (ref 70–99)

## 2018-06-17 LAB — COOXEMETRY PANEL
Carboxyhemoglobin: 1.1 % (ref 0.5–1.5)
Methemoglobin: 1.8 % — ABNORMAL HIGH (ref 0.0–1.5)
O2 Saturation: 47.7 %
Total hemoglobin: 10.3 g/dL — ABNORMAL LOW (ref 12.0–16.0)

## 2018-06-17 MED ORDER — METFORMIN HCL 500 MG PO TABS
1000.0000 mg | ORAL_TABLET | Freq: Two times a day (BID) | ORAL | Status: DC
  Administered 2018-06-17 (×2): 1000 mg via ORAL
  Filled 2018-06-17 (×2): qty 2

## 2018-06-17 NOTE — Progress Notes (Signed)
CARDIAC REHAB PHASE I   PRE:  Rate/Rhythm: 81 NSR  BP:   Sitting: 119/65     SaO2: 95% RA  MODE:  Ambulation: 470 ft   POST:  Rate/Rhythm: 86 NSR  BP:   Sitting: 137/66     SaO2: 94% RA 12:43-1:45 Pt ambulated 470ft with one person A and IV pole. Pt maintained a56 steady gait and pace. Pt did complain of fatigue and soreness in L foot. L foot appeared to be swollen, notified RN.  Encouraged mobility and LE elevation for blood flow. Returned pt to recliner and VSS. Performed OHS education, practiced bed mobility and sit to stands without use of UE. Pt demonstrated well. Pt did have a bowel movement while cardiac rehab was in the room. Able to resume OHS education on wound care, risk factors, lifting and activity restrictions, sternal precautions, HH/diabetes diet, smoking cessation (have fake cigarrette), exercise guidelines and emergency/temperature precautions. Pt practice IS and flutter valve. [facility].  Ahmir Bracken D Aisling Emigh,MS,ACSM-RCEP 06/17/2018

## 2018-06-17 NOTE — Progress Notes (Signed)
[facility]  Subjective  - Left foot pain stable - rest pain for one month.  Duplex yesterday suggestive of SFA occlusion.  ABI 0.3 in left foot.    Objective 119/65 82 99.1 F (37.3 C) (Oral) 16 95%  Intake/Output Summary (Last 24 hours) at 06/17/2018 1417 Last data filed at 06/17/2018 1200 Gross per 24 hour  Intake 556.01 ml  Output -  Net 556.01 ml    General: Resting, NAD Extremities: left foot warm, motor intact, PT/DP signal  Assessment/Planning: Rest pain in left foot x one month.  One week s/p CABG.  No tissue loss.  Ok for discharge from our standpoint.  Will arrange outpatient arteriogram Wednesday this week.  Call with questions or concerns.  Cephus ShellingChristopher J Aaliyah Cancro 06/17/2018 2:17 PM --  Laboratory Lab Results: Recent Labs    06/16/18 0340 06/17/18 0342  WBC 11.7* 9.9  HGB 8.9* 9.6*  HCT 27.5* 29.9*  PLT 285 344   BMET Recent Labs    06/16/18 0340 06/17/18 0342  NA 136 135  K 3.8 4.5  CL 101 98  CO2 27 28  GLUCOSE 104* 137*  BUN 16 13  CREATININE 1.28* 1.13  CALCIUM 8.0* 7.6*    COAG Lab Results  Component Value Date   INR 1.45 06/12/2018   INR 1.14 06/10/2018   INR 1.23 06/09/2018   No results found for: PTT    Cephus Shellinghristopher J. Tresia Revolorio, MD [facility] [place] Office: [phone] Pager: [phone]

## 2018-06-17 NOTE — Progress Notes (Signed)
[address]       [place] [id number]             [phone]      5 Days Post-Op Procedure(s) (LRB): CORONARY ARTERY BYPASS GRAFTING (CABG) x 3 WITH ENDOSCOPIC HARVESTING OF RIGHT GREATER SAPHENOUS VEIN. LIMA TO LAD. SVG TO PD. SVG TO DIAGONAL. (N/A) TRANSESOPHAGEAL ECHOCARDIOGRAM (TEE) (N/A) Subjective: Primary c/o is inability to sleep in these hospital beds Some sternotomy discomfort  Objective: Vital signs in last 24 hours: Temp:  [97.4 F (36.3 C)-98.2 F (36.8 C)] 98.2 F (36.8 C) (08/17 0031) Pulse Rate:  [72-89] 72 (08/17 0031) Cardiac Rhythm: Normal sinus rhythm (08/17 0700) Resp:  [14-21] 14 (08/17 0031) BP: (109-114)/(63-72) 109/72 (08/17 0031) SpO2:  [93 %-95 %] 95 % (08/17 0031) FiO2 (%):  [98 %] 98 % (08/16 1948) Weight:  [94.7 kg] 94.7 kg (08/17 0324)  Hemodynamic parameters for last 24 hours:    Intake/Output from previous day: 08/16 0701 - 08/17 0700 In: 301.7 [P.O.:240; I.V.:61.7] Out: -  Intake/Output this shift: No intake/output data recorded.  General appearance: alert, cooperative and no distress Heart: regular rate and rhythm Lungs: dim right base, o/w clear without rales or wheeze Abdomen: benign Extremities: + edema Wound: incis healing well  Lab Results: Recent Labs    06/16/18 0340 06/17/18 0342  WBC 11.7* 9.9  HGB 8.9* 9.6*  HCT 27.5* 29.9*  PLT 285 344   BMET:  Recent Labs    06/16/18 0340 06/17/18 0342  NA 136 135  K 3.8 4.5  CL 101 98  CO2 27 28  GLUCOSE 104* 137*  BUN 16 13  CREATININE 1.28* 1.13  CALCIUM 8.0* 7.6*    PT/INR: No results for input(s): LABPROT, INR in the last 72 hours. ABG    Component Value Date/Time   PHART 7.382 06/13/2018 1000   HCO3 20.9 06/13/2018 1000   TCO2 24 06/14/2018 1645   ACIDBASEDEF 3.4 (H) 06/13/2018 1000   O2SAT 47.7 06/17/2018 0350   CBG (last 3)  Recent Labs    06/16/18 2008 06/17/18 0032 06/17/18 0503  GLUCAP 165* 138* 132*     Meds Scheduled Meds: . acetaminophen  1,000 mg Oral Q6H   Or  . acetaminophen (TYLENOL) oral liquid 160 mg/5 mL  1,000 mg Per Tube Q6H  . amiodarone  400 mg Oral BID  . aspirin EC  325 mg Oral Daily   Or  . aspirin  324 mg Per Tube Daily  . atorvastatin  40 mg Oral q1800  . bisacodyl  10 mg Oral Daily   Or  . bisacodyl  10 mg Rectal Daily  . carvedilol  12.5 mg Oral BID WC  . Chlorhexidine Gluconate Cloth  6 each Topical Daily  . docusate sodium  200 mg Oral Daily  . furosemide  40 mg Oral Daily  . insulin aspart  0-24 Units Subcutaneous Q4H  . insulin aspart  4 Units Subcutaneous TID WC  . insulin detemir  15 Units Subcutaneous BID  . mouth rinse  15 mL Mouth Rinse BID  . metoCLOPramide (REGLAN) injection  10 mg Intravenous Q6H  . mometasone-formoterol  2 puff Inhalation BID  . pantoprazole  40 mg Oral Daily  . potassium chloride  20 mEq Oral BID  . sodium chloride flush  10-40 mL Intracatheter Q12H  . sodium chloride flush  10-40 mL Intracatheter Q12H  . sodium chloride flush  3 mL Intravenous Q12H   Continuous  Infusions: . sodium chloride Stopped (06/13/18 2001)  . sodium chloride    . sodium chloride 10 mL/hr at 06/15/18 2120  . milrinone 0.125 mcg/kg/min (06/17/18 0335)   PRN Meds:.sodium chloride, levalbuterol, metoprolol tartrate, morphine injection, ondansetron (ZOFRAN) IV, oxyCODONE, sodium chloride flush, sodium chloride flush, sodium chloride flush, traMADol  Xrays Dg Chest 2 View  Result Date: 06/16/2018 CLINICAL DATA:  CABG EXAM: CHEST - 2 VIEW COMPARISON:  06/15/2018 FINDINGS: Prior CABG. Cardiomegaly. Diffuse interstitial prominence throughout the lungs could reflect interstitial edema. More confluent opacity in the left lower lobe again noted, slightly improved. IMPRESSION: Diffuse interstitial prominence, question interstitial edema. Focal left lower lobe opacity could reflect pneumonia, slightly improved. Electronically Signed   By: Charlett NoseKevin  Dover M.D.    On: 06/16/2018 09:22    Assessment/Plan: S/P Procedure(s) (LRB): CORONARY ARTERY BYPASS GRAFTING (CABG) x 3 WITH ENDOSCOPIC HARVESTING OF RIGHT GREATER SAPHENOUS VEIN. LIMA TO LAD. SVG TO PD. SVG TO DIAGONAL. (N/A) TRANSESOPHAGEAL ECHOCARDIOGRAM (TEE) (N/A)   1 some runs of vtach- K= is 4.5, MG++ 2.2 most recent, on coreg and amio currently, cont to monitor. Will leave pacer wires one more day. COOX is 47.7 today and he is trending down. Milrinone is 0.125- consider stopping soon. May need to consider life vest. Post procedure  TEE EF was 35-40%, preop 20-25%. May need AHF team to assist with long term management 2 CBG's fair control, most recent creat 1.13- will start glucophage which he was on preop and stop insulin except SSI for now 3 H/H is pretty stable 4 cont lasix for volume overload 5 routine rehab/pulm toilet, pulm RX 6 vasc exam is clinically stable- VVS has outlined plans for PAD eval 7 keep in hospital this weekend per PVT  LOS: 9 days    Rowe ClackWayne E Gold 06/17/2018

## 2018-06-17 NOTE — Plan of Care (Signed)
  Problem: Education: Goal: Knowledge of General Education information will improve Description Including pain rating scale, medication(s)/side effects and non-pharmacologic comfort measures Outcome: Progressing   Problem: Health Behavior/Discharge Planning: Goal: Ability to manage health-related needs will improve Outcome: Progressing   Problem: Clinical Measurements: Goal: Will remain free from infection Outcome: Progressing Goal: Respiratory complications will improve Outcome: Progressing Goal: Cardiovascular complication will be avoided Outcome: Progressing   Problem: Nutrition: Goal: Adequate nutrition will be maintained Outcome: Progressing   Problem: Skin Integrity: Goal: Risk for impaired skin integrity will decrease Outcome: Progressing   Problem: Activity: Goal: Ability to tolerate increased activity will improve Outcome: Progressing   Problem: Coping: Goal: Ability to adjust to condition or change in health will improve Outcome: Progressing   Problem: Elimination: Goal: Will not experience complications related to bowel motility Outcome: Completed/Met Goal: Will not experience complications related to urinary retention Outcome: Completed/Met

## 2018-06-17 NOTE — Progress Notes (Signed)
Subjective:  Patient denies any chest pain or shortness of breath complaints of tingling numbness in the left foot. No further episodes of nonsustained VT. Objective:  Vital Signs in the last 24 hours: Temp:  [97.4 F (36.3 C)-98.7 F (37.1 C)] 98.7 F (37.1 C) (08/17 0823) Pulse Rate:  [72-82] 82 (08/17 0833) Resp:  [14-24] 24 (08/17 0900) BP: (109-124)/(63-72) 124/64 (08/17 0823) SpO2:  [95 %] 95 % (08/17 0031) FiO2 (%):  [97 %-98 %] 97 % (08/17 0900) Weight:  [94.7 kg] 94.7 kg (08/17 0324)  Intake/Output from previous day: 08/16 0701 - 08/17 0700 In: 301.7 [P.O.:240; I.V.:61.7] Out: -  Intake/Output from this shift: No intake/output data recorded.  Physical Exam: Neck: no adenopathy, no carotid bruit, no JVD and supple, symmetrical, trachea midline Lungs: Decreased breath sound at bases Heart: regular rate and rhythm, S1, S2 normal and Soft systolic murmur noted surgical incision healing well Abdomen: soft, non-tender; bowel sounds normal; no masses,  no organomegaly Extremities: No clubbing cyanosis 1+ edema noted  Lab Results: Recent Labs    06/16/18 0340 06/17/18 0342  WBC 11.7* 9.9  HGB 8.9* 9.6*  PLT 285 344   Recent Labs    06/16/18 0340 06/17/18 0342  NA 136 135  K 3.8 4.5  CL 101 98  CO2 27 28  GLUCOSE 104* 137*  BUN 16 13  CREATININE 1.28* 1.13   No results for input(s): TROPONINI in the last 72 hours.  Invalid input(s): CK, MB Hepatic Function Panel No results for input(s): PROT, ALBUMIN, AST, ALT, ALKPHOS, BILITOT, BILIDIR, IBILI in the last 72 hours. No results for input(s): CHOL in the last 72 hours. No results for input(s): PROTIME in the last 72 hours.  Imaging: Imaging results have been reviewed and Dg Chest 2 View  Result Date: 06/16/2018 CLINICAL DATA:  CABG EXAM: CHEST - 2 VIEW COMPARISON:  06/15/2018 FINDINGS: Prior CABG. Cardiomegaly. Diffuse interstitial prominence throughout the lungs could reflect interstitial edema. More  confluent opacity in the left lower lobe again noted, slightly improved. IMPRESSION: Diffuse interstitial prominence, question interstitial edema. Focal left lower lobe opacity could reflect pneumonia, slightly improved. Electronically Signed   By: Charlett NoseKevin  Dover M.D.   On: 06/16/2018 09:22    Cardiac Studies:  Assessment/Plan:  Status post acute coronary syndrome Severe three-vessel coronary artery disease status post CABG 3 postop day 5 doing well Ischemic cardiomyopathy Status post nonsustained VT Hypertension Diabetes mellitus Peripheral vascular disease Postop anemia Plan Add low-dose ACE inhibitor. Increase ambulation as tolerated Home soon   LOS: 9 days    Rinaldo CloudHarwani, Vielka Klinedinst 06/17/2018, 11:23 AM

## 2018-06-17 NOTE — Plan of Care (Signed)
Care plans reviewed and patient is progressing.  

## 2018-06-18 LAB — BASIC METABOLIC PANEL
Anion gap: 5 (ref 5–15)
BUN: 17 mg/dL (ref 6–20)
CO2: 25 mmol/L (ref 22–32)
Calcium: 8.2 mg/dL — ABNORMAL LOW (ref 8.9–10.3)
Chloride: 107 mmol/L (ref 98–111)
Creatinine, Ser: 1.54 mg/dL — ABNORMAL HIGH (ref 0.61–1.24)
GFR calc Af Amer: 55 mL/min — ABNORMAL LOW (ref >60)
GFR calc non Af Amer: 47 mL/min — ABNORMAL LOW (ref >60)
Glucose, Bld: 162 mg/dL — ABNORMAL HIGH (ref 70–99)
Potassium: 4.6 mmol/L (ref 3.5–5.1)
Sodium: 137 mmol/L (ref 135–145)

## 2018-06-18 LAB — COOXEMETRY PANEL
Carboxyhemoglobin: 1.4 % (ref 0.5–1.5)
Methemoglobin: 1.3 % (ref 0.0–1.5)
O2 Saturation: 60.6 %
Total hemoglobin: 10.8 g/dL — ABNORMAL LOW (ref 12.0–16.0)

## 2018-06-18 LAB — GLUCOSE, CAPILLARY
GLUCOSE-CAPILLARY: 128 mg/dL — AB (ref 70–99)
GLUCOSE-CAPILLARY: 181 mg/dL — AB (ref 70–99)
GLUCOSE-CAPILLARY: 229 mg/dL — AB (ref 70–99)
GLUCOSE-CAPILLARY: 88 mg/dL (ref 70–99)
Glucose-Capillary: 150 mg/dL — ABNORMAL HIGH (ref 70–99)
Glucose-Capillary: 193 mg/dL — ABNORMAL HIGH (ref 70–99)
Glucose-Capillary: 65 mg/dL — ABNORMAL LOW (ref 70–99)

## 2018-06-18 LAB — CBC
HCT: 31.8 % — ABNORMAL LOW (ref 39.0–52.0)
Hemoglobin: 10.1 g/dL — ABNORMAL LOW (ref 13.0–17.0)
MCH: 30.1 pg (ref 26.0–34.0)
MCHC: 31.8 g/dL (ref 30.0–36.0)
MCV: 94.9 fL (ref 78.0–100.0)
Platelets: 471 10*3/uL — ABNORMAL HIGH (ref 150–400)
RBC: 3.35 MIL/uL — ABNORMAL LOW (ref 4.22–5.81)
RDW: 12.9 % (ref 11.5–15.5)
WBC: 10.5 10*3/uL (ref 4.0–10.5)

## 2018-06-18 MED ORDER — INSULIN GLARGINE 100 UNIT/ML ~~LOC~~ SOLN
15.0000 [IU] | Freq: Every day | SUBCUTANEOUS | Status: DC
  Filled 2018-06-18: qty 0.15

## 2018-06-18 MED ORDER — ZOLPIDEM TARTRATE 5 MG PO TABS
5.0000 mg | ORAL_TABLET | Freq: Every evening | ORAL | Status: DC | PRN
  Administered 2018-06-18: 5 mg via ORAL
  Filled 2018-06-18: qty 1

## 2018-06-18 NOTE — Plan of Care (Signed)
Care plans reviewed and patient is progressing.  

## 2018-06-18 NOTE — Progress Notes (Signed)
Subjective:  Patient denies any chest pain or shortness of breath.  Continues to have numbness and tingling in the left foot  Objective:  Vital Signs in the last 24 hours: Temp:  [97.9 F (36.6 C)-99.1 F (37.3 C)] 97.9 F (36.6 C) (08/18 0901) Pulse Rate:  [77-82] 77 (08/18 0901) Resp:  [15-21] 20 (08/18 0901) BP: (102-125)/(55-67) 121/67 (08/18 0901) SpO2:  [96 %-97 %] 96 % (08/18 0806) FiO2 (%):  [96 %-97 %] 96 % (08/17 1959) Weight:  [94.6 kg] 94.6 kg (08/18 0433)  Intake/Output from previous day: 08/17 0701 - 08/18 0700 In: 321 [P.O.:240; I.V.:81] Out: -  Intake/Output from this shift: No intake/output data recorded.  Physical Exam: Neck: no adenopathy, no carotid bruit, no JVD and supple, symmetrical, trachea midline Lungs: clear to auscultation bilaterally Heart: regular rate and rhythm, S1, S2 normal and soft systolic murmur noted Abdomen: soft, non-tender; bowel sounds normal; no masses,  no organomegaly Extremities: extremities normal, atraumatic, no cyanosis or edema  Lab Results: Recent Labs    06/17/18 0342 06/18/18 0431  WBC 9.9 10.5  HGB 9.6* 10.1*  PLT 344 471*   Recent Labs    06/17/18 0342 06/18/18 0431  NA 135 137  K 4.5 4.6  CL 98 107  CO2 28 25  GLUCOSE 137* 162*  BUN 13 17  CREATININE 1.13 1.54*   No results for input(s): TROPONINI in the last 72 hours.  Invalid input(s): CK, MB Hepatic Function Panel No results for input(s): PROT, ALBUMIN, AST, ALT, ALKPHOS, BILITOT, BILIDIR, IBILI in the last 72 hours. No results for input(s): CHOL in the last 72 hours. No results for input(s): PROTIME in the last 72 hours.  Imaging: Imaging results have been reviewed and No results found.  Cardiac Studies:  Assessment/Plan:  Status post acute coronary syndrome Severe three-vessel coronary artery disease status post CABG 3 postop day 5 doing well Ischemic cardiomyopathy Status post nonsustained VT Hypertension Diabetes mellitus Peripheral  vascular disease Postop anemia Acute kidney injury. Plan Agree with holding metformin and ACE inhibitor for now   LOS: 10 days    Rinaldo CloudHarwani, Zyrion Coey 06/18/2018, 9:03 AM

## 2018-06-18 NOTE — Progress Notes (Addendum)
[address]       [place] [id number]             [phone]      6 Days Post-Op Procedure(s) (LRB): CORONARY ARTERY BYPASS GRAFTING (CABG) x 3 WITH ENDOSCOPIC HARVESTING OF RIGHT GREATER SAPHENOUS VEIN. LIMA TO LAD. SVG TO PD. SVG TO DIAGONAL. (N/A) TRANSESOPHAGEAL ECHOCARDIOGRAM (TEE) (N/A) Subjective: Feels pretty well, COOX 60 this am, mo further Vtach, much fewer PVC's  Objective: Vital signs in last 24 hours: Temp:  [97.9 F (36.6 C)-99.1 F (37.3 C)] 97.9 F (36.6 C) (08/18 0445) Pulse Rate:  [79-82] 82 (08/18 0011) Cardiac Rhythm: Normal sinus rhythm (08/18 0030) Resp:  [15-24] 15 (08/18 0445) BP: (102-125)/(55-67) 113/64 (08/18 0445) SpO2:  [97 %] 97 % (08/18 0445) FiO2 (%):  [96 %-97 %] 96 % (08/17 1959) Weight:  [94.6 kg] 94.6 kg (08/18 0433)  Hemodynamic parameters for last 24 hours:    Intake/Output from previous day: 08/17 0701 - 08/18 0700 In: 321 [P.O.:240; I.V.:81] Out: -  Intake/Output this shift: No intake/output data recorded.  General appearance: alert, cooperative and no distress Heart: regular rate and rhythm Lungs: dim in bases Abdomen: benign Extremities: edema improved Wound: incis healing well  Lab Results: Recent Labs    06/17/18 0342 06/18/18 0431  WBC 9.9 10.5  HGB 9.6* 10.1*  HCT 29.9* 31.8*  PLT 344 471*   BMET:  Recent Labs    06/17/18 0342 06/18/18 0431  NA 135 137  K 4.5 4.6  CL 98 107  CO2 28 25  GLUCOSE 137* 162*  BUN 13 17  CREATININE 1.13 1.54*  CALCIUM 7.6* 8.2*    PT/INR: No results for input(s): LABPROT, INR in the last 72 hours. ABG    Component Value Date/Time   PHART 7.382 06/13/2018 1000   HCO3 20.9 06/13/2018 1000   TCO2 24 06/14/2018 1645   ACIDBASEDEF 3.4 (H) 06/13/2018 1000   O2SAT 60.6 06/18/2018 0445   CBG (last 3)  Recent Labs    06/18/18 0009 06/18/18 0036 06/18/18 0359  GLUCAP 65* 88 128*    Meds Scheduled Meds: . amiodarone  400 mg Oral BID  .  aspirin EC  325 mg Oral Daily   Or  . aspirin  324 mg Per Tube Daily  . atorvastatin  40 mg Oral q1800  . bisacodyl  10 mg Oral Daily   Or  . bisacodyl  10 mg Rectal Daily  . carvedilol  12.5 mg Oral BID WC  . Chlorhexidine Gluconate Cloth  6 each Topical Daily  . docusate sodium  200 mg Oral Daily  . furosemide  40 mg Oral Daily  . insulin aspart  0-24 Units Subcutaneous Q4H  . mouth rinse  15 mL Mouth Rinse BID  . metFORMIN  1,000 mg Oral BID WC  . metoCLOPramide (REGLAN) injection  10 mg Intravenous Q6H  . mometasone-formoterol  2 puff Inhalation BID  . pantoprazole  40 mg Oral Daily  . potassium chloride  20 mEq Oral BID  . sodium chloride flush  10-40 mL Intracatheter Q12H  . sodium chloride flush  10-40 mL Intracatheter Q12H  . sodium chloride flush  3 mL Intravenous Q12H   Continuous Infusions: . sodium chloride Stopped (06/13/18 2001)  . sodium chloride    . sodium chloride 10 mL/hr at 06/15/18 2120  . milrinone 0.125 mcg/kg/min (06/18/18 0648)   PRN Meds:.sodium chloride, levalbuterol, metoprolol tartrate, morphine injection, ondansetron (ZOFRAN) IV,  oxyCODONE, sodium chloride flush, sodium chloride flush, sodium chloride flush, traMADol  Xrays Dg Chest 2 View  Result Date: 06/16/2018 CLINICAL DATA:  CABG EXAM: CHEST - 2 VIEW COMPARISON:  06/15/2018 FINDINGS: Prior CABG. Cardiomegaly. Diffuse interstitial prominence throughout the lungs could reflect interstitial edema. More confluent opacity in the left lower lobe again noted, slightly improved. IMPRESSION: Diffuse interstitial prominence, question interstitial edema. Focal left lower lobe opacity could reflect pneumonia, slightly improved. Electronically Signed   By: Charlett NoseKevin  Dover M.D.   On: 06/16/2018 09:22    Assessment/Plan: S/P Procedure(s) (LRB): CORONARY ARTERY BYPASS GRAFTING (CABG) x 3 WITH ENDOSCOPIC HARVESTING OF RIGHT GREATER SAPHENOUS VEIN. LIMA TO LAD. SVG TO PD. SVG TO DIAGONAL. (N/A) TRANSESOPHAGEAL  ECHOCARDIOGRAM (TEE) (N/A)  1 Improving hemodynamics , rhythm more stable- will d/c milrinone today, cont coreg and amio 2 creat increased to 1.5- will d/c metformin initiated yesterday, will ask DM coordinator to assist HgBA1C 12.4- will add lantus, A1C has never been good since 2015 at least 11 3 cont to monitor left foot with plans as per vascular surgery 4 cont current diuretics 5 d/c wires 6 H/H stable  LOS: 10 days    Rowe ClackWayne E Gold 06/18/2018  I have seen and examined the patient and agree with the assessment and plan as outlined.  Purcell Nailslarence H Owen, MD 06/18/2018 11:32 AM

## 2018-06-18 NOTE — Progress Notes (Addendum)
[facility]  Subjective  - Doing well over all.  Still having tingling in the left toes.  No report of pain.   Objective 113/64 82 97.9 F (36.6 C) (Oral) 15 97%  Intake/Output Summary (Last 24 hours) at 06/18/2018 0740 Last data filed at 06/18/2018 0040 Gross per 24 hour  Intake 320.95 ml  Output -  Net 320.95 ml    Left foot without non healing wounds, Doppler PT/DP signals, active range of motion intact.  Arterial duplex 06/16/2018 Final Interpretation: Right: 50-74% stenosis noted in the deep femoral artery. 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  Left: 30-49% stenosis noted in the deep femoral artery. Total occlusion noted in the superficial femoral artery mid. Total occlusion noted in the posterior tibial artery proximal.  Assessment/Planning: PAD with symptomatic change in sensation and tingling left GT and second toe. Occluded SFA's Bilaterally Intact Doppler signal PT/DP left LE Stable disposition without  The threat of limb loss.  Plan angiogram with runoff and possible intervention Wed. With Dr. Randie Heinzain in patient verses outpatient.  Pending his discharge date post CABG.  Cr elevate 1.54 Metformin was started yesterday, now on hold.  A1C > 11. Currently on aspirin 325 po daily.  Robert Sanchez 06/18/2018 7:40 AM --  Laboratory Lab Results: Recent Labs    06/17/18 0342 06/18/18 0431  WBC 9.9 10.5  HGB 9.6* 10.1*  HCT 29.9* 31.8*  PLT 344 471*   BMET Recent Labs    06/17/18 0342 06/18/18 0431  NA 135 137  K 4.5 4.6  CL 98 107  CO2 28 25  GLUCOSE 137* 162*  BUN 13 17  CREATININE 1.13 1.54*  CALCIUM 7.6* 8.2*    COAG Lab Results  Component Value Date   INR 1.45 06/12/2018   INR 1.14 06/10/2018   INR 1.23 06/09/2018   No results found for: PTT  I have seen and evaluated the patient. I agree with the PA note as documented above.  Stable rest pain in left foot. Discussed  outpatient arteriogram Wednesday unless he is still here.  Cr slightly increased today - will need to watch prior to arteriogram.   Cephus Shellinghristopher J. Brayant Dorr, MD [facility] [place] Office: [phone] Pager: [phone]

## 2018-06-18 NOTE — Progress Notes (Signed)
Hypoglycemic Event  CBG: 65  Treatment: 15 GM carbohydrate snack  Symptoms: None  Follow-up CBG: Time: 00:37 CBG Result: 1588   [name]

## 2018-06-19 ENCOUNTER — Other Ambulatory Visit: Payer: Self-pay | Admitting: *Deleted

## 2018-06-19 LAB — COOXEMETRY PANEL
Carboxyhemoglobin: 1.1 % (ref 0.5–1.5)
Methemoglobin: 1.8 % — ABNORMAL HIGH (ref 0.0–1.5)
O2 Saturation: 49.5 %
Total hemoglobin: 10.2 g/dL — ABNORMAL LOW (ref 12.0–16.0)

## 2018-06-19 LAB — BASIC METABOLIC PANEL
Anion gap: 7 (ref 5–15)
BUN: 17 mg/dL (ref 6–20)
CO2: 24 mmol/L (ref 22–32)
Calcium: 8.5 mg/dL — ABNORMAL LOW (ref 8.9–10.3)
Chloride: 104 mmol/L (ref 98–111)
Creatinine, Ser: 1.4 mg/dL — ABNORMAL HIGH (ref 0.61–1.24)
GFR calc Af Amer: >60 mL/min (ref >60)
GFR calc non Af Amer: 53 mL/min — ABNORMAL LOW (ref >60)
Glucose, Bld: 148 mg/dL — ABNORMAL HIGH (ref 70–99)
Potassium: 4.9 mmol/L (ref 3.5–5.1)
Sodium: 135 mmol/L (ref 135–145)

## 2018-06-19 LAB — CBC
HCT: 30.7 % — ABNORMAL LOW (ref 39.0–52.0)
Hemoglobin: 10.1 g/dL — ABNORMAL LOW (ref 13.0–17.0)
MCH: 30.5 pg (ref 26.0–34.0)
MCHC: 32.9 g/dL (ref 30.0–36.0)
MCV: 92.7 fL (ref 78.0–100.0)
Platelets: 436 10*3/uL — ABNORMAL HIGH (ref 150–400)
RBC: 3.31 MIL/uL — ABNORMAL LOW (ref 4.22–5.81)
RDW: 12.6 % (ref 11.5–15.5)
WBC: 11.3 10*3/uL — ABNORMAL HIGH (ref 4.0–10.5)

## 2018-06-19 LAB — GLUCOSE, CAPILLARY
GLUCOSE-CAPILLARY: 135 mg/dL — AB (ref 70–99)
GLUCOSE-CAPILLARY: 141 mg/dL — AB (ref 70–99)

## 2018-06-19 MED ORDER — ASPIRIN 325 MG PO TBEC: 325.0000 mg | DELAYED_RELEASE_TABLET | Freq: Every day | ORAL | 0 refills | Status: DC

## 2018-06-19 MED ORDER — ENOXAPARIN SODIUM 30 MG/0.3ML ~~LOC~~ SOLN: 30.0000 mg | SUBCUTANEOUS | Status: DC

## 2018-06-19 MED ORDER — INSULIN ASPART 100 UNIT/ML ~~LOC~~ SOLN: 0.0000 [IU] | Freq: Three times a day (TID) | SUBCUTANEOUS | Status: DC

## 2018-06-19 MED ORDER — FUROSEMIDE 40 MG PO TABS: 40.0000 mg | ORAL_TABLET | Freq: Every day | ORAL | 1 refills | Status: DC

## 2018-06-19 MED ORDER — POTASSIUM CHLORIDE CRYS ER 20 MEQ PO TBCR: 20.0000 meq | EXTENDED_RELEASE_TABLET | Freq: Every day | ORAL | Status: DC

## 2018-06-19 MED ORDER — AMIODARONE HCL 400 MG PO TABS: 400.0000 mg | ORAL_TABLET | Freq: Two times a day (BID) | ORAL | 1 refills | Status: DC

## 2018-06-19 MED ORDER — TRAMADOL HCL 50 MG PO TABS: 50.0000 mg | ORAL_TABLET | ORAL | 0 refills | Status: DC | PRN

## 2018-06-19 MED ORDER — CARVEDILOL 12.5 MG PO TABS: 12.5000 mg | ORAL_TABLET | Freq: Two times a day (BID) | ORAL | 3 refills | Status: DC

## 2018-06-19 MED ORDER — ATORVASTATIN CALCIUM 40 MG PO TABS: 40.0000 mg | ORAL_TABLET | Freq: Every day | ORAL | 3 refills | Status: DC

## 2018-06-19 MED ORDER — POTASSIUM CHLORIDE CRYS ER 20 MEQ PO TBCR: 20.0000 meq | EXTENDED_RELEASE_TABLET | Freq: Every day | ORAL | 1 refills | Status: DC

## 2018-06-19 MED FILL — CARVEDILOL 12.5 MG TABLET: 12.5 | 30 days supply | Qty: 60 | Fill #0

## 2018-06-19 MED FILL — FUROSEMIDE 40 MG TAB: 40 | 30 days supply | Qty: 30 | Fill #0

## 2018-06-19 MED FILL — POTASSIUM CL ER 20 MEQ TAB: 20 | 30 days supply | Qty: 30 | Fill #0

## 2018-06-19 MED FILL — AMIODARONE HCL 200 MG TAB: 200 | 30 days supply | Qty: 120 | Fill #0

## 2018-06-19 MED FILL — ATORVASTATIN 40 MG TABLET: 40 | 30 days supply | Qty: 30 | Fill #0

## 2018-06-19 MED FILL — traMADol HCL 50 MG TABS: 50 | 5 days supply | Qty: 30 | Fill #0

## 2018-06-19 NOTE — Progress Notes (Signed)
  Progress Note    06/19/2018 7:40 AM 7 Days Post-Op  Subjective:  No new complaints.  Still with numbness and tingling in L foot   Vitals:   06/18/18 2141 06/19/18 0439  BP: (!) 118/58 128/71  Pulse:    Resp: 20 20  Temp: 98.1 F (36.7 C) 98.7 F (37.1 C)  SpO2: 95%    Physical Exam: Lungs:  Non labored Extremities:  L PT and ATA soft signals by doppler Abdomen:  Soft Neurologic: A&O  CBC    Component Value Date/Time   WBC 11.3 (H) 06/19/2018 0442   RBC 3.31 (L) 06/19/2018 0442   HGB 10.1 (L) 06/19/2018 0442   HCT 30.7 (L) 06/19/2018 0442   PLT 436 (H) 06/19/2018 0442   MCV 92.7 06/19/2018 0442   MCH 30.5 06/19/2018 0442   MCHC 32.9 06/19/2018 0442   RDW 12.6 06/19/2018 0442   LYMPHSABS 4.8 (H) 06/03/2018 0442   MONOABS 1.3 (H) 06/03/2018 0442   EOSABS 0.3 06/03/2018 0442   BASOSABS 0.1 06/03/2018 0442    BMET    Component Value Date/Time   NA 135 06/19/2018 0442   K 4.9 06/19/2018 0442   CL 104 06/19/2018 0442   CO2 24 06/19/2018 0442   GLUCOSE 148 (H) 06/19/2018 0442   BUN 17 06/19/2018 0442   CREATININE 1.40 (H) 06/19/2018 0442   CREATININE 0.81 08/14/2014 1126   CALCIUM 8.5 (L) 06/19/2018 0442   GFRNONAA 53 (L) 06/19/2018 0442   GFRNONAA >89 08/14/2014 1126   GFRAA >60 06/19/2018 0442   GFRAA >89 08/14/2014 1126    INR    Component Value Date/Time   INR 1.45 06/12/2018 1430     Intake/Output Summary (Last 24 hours) at 06/19/2018 0740 Last data filed at 06/18/2018 1138 Gross per 24 hour  Intake 510.94 ml  Output -  Net 510.94 ml     Assessment/Plan:  62 y.o. male is s/p CABG with PAD and B SFA CTO 7 Days Post-Op   L PT and AT soft by doppler Ok for discharge from vascular standpoint Plan is for outpatient arteriogram LLE on Wednesday Patient is agreeable to this and anticipating discharge today from primary service   Emilie RutterMatthew Suellyn Meenan, PA-C [facility] [phone] 06/19/2018 7:40 AM

## 2018-06-19 NOTE — Progress Notes (Signed)
Discussed with the patient and all questioned fully answered. He will call me if any problems arise.  IV removed. Telemetry removed, CCMD notified. Sutures removed per order. Discharge paper work reviewed by Lenward ChancellorLydia Betz RN.   Leonidas Rombergaitlin S Bumbledare, RN

## 2018-06-19 NOTE — Progress Notes (Addendum)
      [address]       [place] [id number]             [phone]      7 Days Post-Op Procedure(s) (LRB): CORONARY ARTERY BYPASS GRAFTING (CABG) x 3 WITH ENDOSCOPIC HARVESTING OF RIGHT GREATER SAPHENOUS VEIN. LIMA TO LAD. SVG TO PD. SVG TO DIAGONAL. (N/A) TRANSESOPHAGEAL ECHOCARDIOGRAM (TEE) (N/A)   Subjective:  Patient wants to go home.  States he feels fine and can not stay here another day.  He states he isn't resting, he has stuff he needs to get done and he has been here too long.  Objective: Vital signs in last 24 hours: Temp:  [97.9 F (36.6 C)-98.7 F (37.1 C)] 98.7 F (37.1 C) (08/19 0439) Pulse Rate:  [74-78] 76 (08/18 1622) Cardiac Rhythm: Normal sinus rhythm (08/19 0700) Resp:  [16-24] 20 (08/19 0439) BP: (113-128)/(58-71) 128/71 (08/19 0439) SpO2:  [94 %-96 %] 95 % (08/18 2141) Weight:  [94 kg] 94 kg (08/19 0439)  Intake/Output from previous day: 08/18 0701 - 08/19 0700 In: 510.9 [P.O.:480; I.V.:30.9] Out: -   General appearance: alert, cooperative and no distress Heart: regular rate and rhythm Lungs: clear to auscultation bilaterally Abdomen: soft, non-tender; bowel sounds normal; no masses,  no organomegaly Extremities: edema + pitting edema RLE Wound: clean and dry  Lab Results: Recent Labs    06/18/18 0431 06/19/18 0442  WBC 10.5 11.3*  HGB 10.1* 10.1*  HCT 31.8* 30.7*  PLT 471* 436*   BMET:  Recent Labs    06/18/18 0431 06/19/18 0442  NA 137 135  K 4.6 4.9  CL 107 104  CO2 25 24  GLUCOSE 162* 148*  BUN 17 17  CREATININE 1.54* 1.40*  CALCIUM 8.2* 8.5*    PT/INR: No results for input(s): LABPROT, INR in the last 72 hours. ABG    Component Value Date/Time   PHART 7.382 06/13/2018 1000   HCO3 20.9 06/13/2018 1000   TCO2 24 06/14/2018 1645   ACIDBASEDEF 3.4 (H) 06/13/2018 1000   O2SAT 49.5 06/19/2018 0450   CBG (last 3)  Recent Labs    06/18/18 2025 06/18/18 2135 06/19/18 0432  GLUCAP 229* 193* 135*     Assessment/Plan: S/P Procedure(s) (LRB): CORONARY ARTERY BYPASS GRAFTING (CABG) x 3 WITH ENDOSCOPIC HARVESTING OF RIGHT GREATER SAPHENOUS VEIN. LIMA TO LAD. SVG TO PD. SVG TO DIAGONAL. (N/A) TRANSESOPHAGEAL ECHOCARDIOGRAM (TEE) (N/A)  1. CV- NSR, BP controlled- Milrinone stopped yesterday, Co-ox down to 49.5 this morning, continue Amiodarone, Coreg 2. Pulm- no acute issues,continue IS 3. Renal- creatinine down to 1.40, weight is stable, continue Lasix at discharge 4. DM- sugars controlled during stay, uncontrolled  Preoperatively, patient's metformin stopped due to mild elevation in creatinine, will restart today, patient will require close follow up at discharge 5. Left lower extremity PAD, needs intervention by vascular tentatively for Wednesday 6. Dispo- patient stable, will discuss possible d/c with Dr. Donata ClayVan  Trigt, Coox did drop to 49 after cessation of Milrinone on Coreg, Amiodarone, may benefit from heart failure consult for medication optimization, however he may be unwilling to stay past today   LOS: 11 days    Lowella Dandyrin Barrett 06/19/2018  DC instructions reviewed with patient patient examined and medical record reviewed,agree with above note. Kathlee Nationseter Van Trigt III 06/19/2018

## 2018-06-19 NOTE — Progress Notes (Signed)
Ref: Patient, No Pcp Per   Subjective:  Awake. VS stable. Monitor shows sinus rhythm.   Objective:  Vital Signs in the last 24 hours: Temp:  [97.9 F (36.6 C)-98.7 F (37.1 C)] 98.7 F (37.1 C) (08/19 0439) Pulse Rate:  [74-77] 76 (08/18 1622) Cardiac Rhythm: Normal sinus rhythm (08/19 0700) Resp:  [16-24] 20 (08/19 0439) BP: (113-128)/(58-71) 128/71 (08/19 0439) SpO2:  [94 %-95 %] 95 % (08/18 2141) Weight:  [94 kg] 94 kg (08/19 0439)  Physical Exam: BP Readings from Last 1 Encounters:  06/19/18 128/71     Wt Readings from Last 1 Encounters:  06/19/18 94 kg    Weight change: -0.6 kg Body mass index is 31.51 kg/m. HEENT: [place]/AT, Eyes-Blue, Conjunctiva-Pink, Sclera-Non-icteric Neck: No JVD, No bruit, Trachea midline. Lungs:  Clear, Bilateral. Healing midline scar. Cardiac:  Regular rhythm, normal S1 and S2, no S3. II/VI systolic murmur. Abdomen:  Soft, non-tender. BS present. Extremities:  Trace right lower leg edema present. No cyanosis. No clubbing. CNS: AxOx3, Cranial nerves grossly intact, moves all 4 extremities.  Skin: Warm and dry.   Intake/Output from previous day: 08/18 0701 - 08/19 0700 In: 510.9 [P.O.:480; I.V.:30.9] Out: -     Lab Results: BMET    Component Value Date/Time   NA 135 06/19/2018 0442   NA 137 06/18/2018 0431   NA 135 06/17/2018 0342   K 4.9 06/19/2018 0442   K 4.6 06/18/2018 0431   K 4.5 06/17/2018 0342   CL 104 06/19/2018 0442   CL 107 06/18/2018 0431   CL 98 06/17/2018 0342   CO2 24 06/19/2018 0442   CO2 25 06/18/2018 0431   CO2 28 06/17/2018 0342   GLUCOSE 148 (H) 06/19/2018 0442   GLUCOSE 162 (H) 06/18/2018 0431   GLUCOSE 137 (H) 06/17/2018 0342   BUN 17 06/19/2018 0442   BUN 17 06/18/2018 0431   BUN 13 06/17/2018 0342   CREATININE 1.40 (H) 06/19/2018 0442   CREATININE 1.54 (H) 06/18/2018 0431   CREATININE 1.13 06/17/2018 0342   CREATININE 0.81 08/14/2014 1126   CREATININE 0.82 11/26/2013 1210   CALCIUM 8.5 (L)  06/19/2018 0442   CALCIUM 8.2 (L) 06/18/2018 0431   CALCIUM 7.6 (L) 06/17/2018 0342   GFRNONAA 53 (L) 06/19/2018 0442   GFRNONAA 47 (L) 06/18/2018 0431   GFRNONAA >60 06/17/2018 0342   GFRNONAA >89 08/14/2014 1126   GFRAA >60 06/19/2018 0442   GFRAA 55 (L) 06/18/2018 0431   GFRAA >60 06/17/2018 0342   GFRAA >89 08/14/2014 1126   CBC    Component Value Date/Time   WBC 11.3 (H) 06/19/2018 0442   RBC 3.31 (L) 06/19/2018 0442   HGB 10.1 (L) 06/19/2018 0442   HCT 30.7 (L) 06/19/2018 0442   PLT 436 (H) 06/19/2018 0442   MCV 92.7 06/19/2018 0442   MCH 30.5 06/19/2018 0442   MCHC 32.9 06/19/2018 0442   RDW 12.6 06/19/2018 0442   LYMPHSABS 4.8 (H) 06/03/2018 0442   MONOABS 1.3 (H) 06/03/2018 0442   EOSABS 0.3 06/03/2018 0442   BASOSABS 0.1 06/03/2018 0442   HEPATIC Function Panel Recent Labs    06/03/18 0442 06/08/18 0936 06/11/18 0413  PROT 7.2 8.0 7.7   HEMOGLOBIN A1C No components found for: HGA1C,  MPG CARDIAC ENZYMES Lab Results  Component Value Date   TROPONINI 0.25 (HH) 06/09/2018   TROPONINI 0.32 (HH) 06/08/2018   TROPONINI 0.31 (HH) 06/08/2018   BNP No results for input(s): PROBNP in the last 8760 hours. TSH  Recent Labs    06/03/18 1414 06/10/18 0342  TSH 0.733 3.719   CHOLESTEROL Recent Labs    06/09/18 0450 06/10/18 0342  CHOL 166 148    Scheduled Meds: . amiodarone  400 mg Oral BID  . aspirin EC  325 mg Oral Daily   Or  . aspirin  324 mg Per Tube Daily  . atorvastatin  40 mg Oral q1800  . bisacodyl  10 mg Oral Daily   Or  . bisacodyl  10 mg Rectal Daily  . carvedilol  12.5 mg Oral BID WC  . Chlorhexidine Gluconate Cloth  6 each Topical Daily  . docusate sodium  200 mg Oral Daily  . enoxaparin (LOVENOX) injection  30 mg Subcutaneous Q24H  . furosemide  40 mg Oral Daily  . insulin aspart  0-24 Units Subcutaneous TID WC  . insulin glargine  15 Units Subcutaneous QHS  . mouth rinse  15 mL Mouth Rinse BID  . mometasone-formoterol  2 puff  Inhalation BID  . pantoprazole  40 mg Oral Daily  . potassium chloride  20 mEq Oral Daily  . sodium chloride flush  10-40 mL Intracatheter Q12H  . sodium chloride flush  10-40 mL Intracatheter Q12H  . sodium chloride flush  3 mL Intravenous Q12H   Continuous Infusions: . sodium chloride Stopped (06/13/18 2001)  . sodium chloride    . sodium chloride 10 mL/hr at 06/15/18 2120   PRN Meds:.sodium chloride, levalbuterol, metoprolol tartrate, morphine injection, ondansetron (ZOFRAN) IV, oxyCODONE, sodium chloride flush, sodium chloride flush, sodium chloride flush, traMADol, zolpidem  Assessment/Plan: Acute coronary syndrome CABG x 3 post op day 7 LM/3 vessel VAD Ischemic cardiomyopathy Nonsustained VT Hypertension Post Op anemia of blood loss Type 2 DM Severe PVD Obesity Acute kidney injury- stable  May discharge home. F/U in 2 weeks.   LOS: 11 days    Orpah CobbAjay Lansing Sigmon  MD  06/19/2018, 8:44 AM

## 2018-06-19 NOTE — Care Management Note (Addendum)
Case Management Note  Patient Details  Name: Ronda Fairlyhomas E Hollar MRN: [id number] Date of Birth: [birth year]  Subjective/Objective:  Pt presented for Chest Pain s/p CABG x3 on 06-12-18. Pt is without insurance at this time and no PCP. CM received referral for PCP needs.             Action/Plan: CM did speak with patient and he is agreeable to have CM [facility] Appointment was scheduled and placed on AVS. Patient is aware to utilize the pharmacy at this location and cost will range from $4.00-$10.00. Patient states he will be able to afford the medications. MATCH not utilized for this hospitalization. No further needs from CM at this time.   Expected Discharge Date:  06/19/18               Expected Discharge Plan:  Home/Self Care  In-House Referral:  NA  Discharge planning Services  CM Consult, Indigent [facility] Clinic, Medication Assistance, Follow-up appt scheduled  Post Acute Care Choice:  NA Choice offered to:  Patient  DME Arranged:  N/A DME Agency:  NA  HH Arranged:  NA HH Agency:  NA  Status of Service:  Completed, signed off  If discussed at Long Length of Stay Meetings, dates discussed:    Additional Comments:  Gala LewandowskyGraves-Bigelow, Lilah Mijangos Kaye, RN 06/19/2018, 10:43 AM

## 2018-06-20 ENCOUNTER — Telehealth (HOSPITAL_COMMUNITY): Payer: Self-pay

## 2018-06-20 NOTE — Telephone Encounter (Signed)
Called pt regarding CR, pt stated he does not have any insurance at this time. Explained to pt the maintenance program. Pt stated he is not working at the moment and will not be able to join the CR program.  Closed referral

## 2018-06-21 ENCOUNTER — Encounter (HOSPITAL_COMMUNITY)
Admission: RE | Disposition: A | Discharge status: Home / Self Care | Payer: Self-pay | Source: Ambulatory Visit | Attending: Vascular Surgery

## 2018-06-21 ENCOUNTER — Telehealth: Payer: Self-pay | Admitting: Vascular Surgery

## 2018-06-21 ENCOUNTER — Ambulatory Visit: Payer: Self-pay | Admitting: Family Medicine

## 2018-06-21 ENCOUNTER — Ambulatory Visit (HOSPITAL_COMMUNITY)
Admission: RE | Admit: 2018-06-21 | Discharge: 2018-06-21 | Disposition: A | Discharge status: Home / Self Care | Payer: Self-pay | Source: Ambulatory Visit | Attending: Vascular Surgery | Admitting: Vascular Surgery

## 2018-06-21 DIAGNOSIS — Z885 Allergy status to narcotic agent status: Secondary | ICD-10-CM | POA: Insufficient documentation

## 2018-06-21 DIAGNOSIS — I70222 Atherosclerosis of native arteries of extremities with rest pain, left leg: Secondary | ICD-10-CM | POA: Insufficient documentation

## 2018-06-21 DIAGNOSIS — Z9889 Other specified postprocedural states: Secondary | ICD-10-CM | POA: Insufficient documentation

## 2018-06-21 DIAGNOSIS — Z951 Presence of aortocoronary bypass graft: Secondary | ICD-10-CM | POA: Insufficient documentation

## 2018-06-21 DIAGNOSIS — R0602 Shortness of breath: Secondary | ICD-10-CM | POA: Insufficient documentation

## 2018-06-21 DIAGNOSIS — J810 Acute pulmonary edema: Secondary | ICD-10-CM | POA: Insufficient documentation

## 2018-06-21 DIAGNOSIS — Z8249 Family history of ischemic heart disease and other diseases of the circulatory system: Secondary | ICD-10-CM | POA: Insufficient documentation

## 2018-06-21 DIAGNOSIS — Z79899 Other long term (current) drug therapy: Secondary | ICD-10-CM | POA: Insufficient documentation

## 2018-06-21 DIAGNOSIS — Z955 Presence of coronary angioplasty implant and graft: Secondary | ICD-10-CM | POA: Insufficient documentation

## 2018-06-21 DIAGNOSIS — E1140 Type 2 diabetes mellitus with diabetic neuropathy, unspecified: Secondary | ICD-10-CM | POA: Insufficient documentation

## 2018-06-21 DIAGNOSIS — F17210 Nicotine dependence, cigarettes, uncomplicated: Secondary | ICD-10-CM | POA: Insufficient documentation

## 2018-06-21 DIAGNOSIS — M1990 Unspecified osteoarthritis, unspecified site: Secondary | ICD-10-CM | POA: Insufficient documentation

## 2018-06-21 DIAGNOSIS — Z794 Long term (current) use of insulin: Secondary | ICD-10-CM | POA: Insufficient documentation

## 2018-06-21 DIAGNOSIS — Z7982 Long term (current) use of aspirin: Secondary | ICD-10-CM | POA: Insufficient documentation

## 2018-06-21 DIAGNOSIS — Z888 Allergy status to other drugs, medicaments and biological substances status: Secondary | ICD-10-CM | POA: Insufficient documentation

## 2018-06-21 DIAGNOSIS — Z833 Family history of diabetes mellitus: Secondary | ICD-10-CM | POA: Insufficient documentation

## 2018-06-21 HISTORY — PX: PERIPHERAL VASCULAR INTERVENTION: CATH118257

## 2018-06-21 HISTORY — PX: ABDOMINAL AORTOGRAM W/LOWER EXTREMITY: CATH118223

## 2018-06-21 LAB — POCT I-STAT, CHEM 8
BUN: 34 mg/dL — AB (ref 6–20)
CREATININE: 1.5 mg/dL — AB (ref 0.61–1.24)
Calcium, Ion: 1.13 mmol/L — ABNORMAL LOW (ref 1.15–1.40)
Chloride: 99 mmol/L (ref 98–111)
Glucose, Bld: 222 mg/dL — ABNORMAL HIGH (ref 70–99)
HEMATOCRIT: 36 % — AB (ref 39.0–52.0)
Hemoglobin: 12.2 g/dL — ABNORMAL LOW (ref 13.0–17.0)
Potassium: 5.2 mmol/L — ABNORMAL HIGH (ref 3.5–5.1)
Sodium: 134 mmol/L — ABNORMAL LOW (ref 135–145)
TCO2: 25 mmol/L (ref 22–32)

## 2018-06-21 LAB — GLUCOSE, CAPILLARY: Glucose-Capillary: 235 mg/dL — ABNORMAL HIGH (ref 70–99)

## 2018-06-21 LAB — POCT ACTIVATED CLOTTING TIME: Activated Clotting Time: 202 seconds

## 2018-06-21 SURGERY — ABDOMINAL AORTOGRAM W/LOWER EXTREMITY
Anesthesia: LOCAL

## 2018-06-21 MED ORDER — HEPARIN SODIUM (PORCINE) 1000 UNIT/ML IJ SOLN
INTRAMUSCULAR | Status: DC | PRN
  Administered 2018-06-21: 9000 [IU] via INTRAVENOUS

## 2018-06-21 MED ORDER — IODIXANOL 320 MG/ML IV SOLN
INTRAVENOUS | Status: DC | PRN
  Administered 2018-06-21: 45 mL via INTRA_ARTERIAL

## 2018-06-21 MED ORDER — HEPARIN (PORCINE) IN NACL 1000-0.9 UT/500ML-% IV SOLN
INTRAVENOUS | Status: AC
  Filled 2018-06-21: qty 1000

## 2018-06-21 MED ORDER — LIDOCAINE HCL (PF) 1 % IJ SOLN
INTRAMUSCULAR | Status: DC | PRN
  Administered 2018-06-21: 20 mL via INTRADERMAL

## 2018-06-21 MED ORDER — ONDANSETRON HCL 4 MG/2ML IJ SOLN: 4.0000 mg | Freq: Four times a day (QID) | INTRAMUSCULAR | Status: DC | PRN

## 2018-06-21 MED ORDER — LABETALOL HCL 5 MG/ML IV SOLN: 10.0000 mg | INTRAVENOUS | Status: DC | PRN

## 2018-06-21 MED ORDER — LIDOCAINE HCL (PF) 1 % IJ SOLN
INTRAMUSCULAR | Status: AC
  Filled 2018-06-21: qty 30

## 2018-06-21 MED ORDER — ACETAMINOPHEN 325 MG PO TABS: 650.0000 mg | ORAL_TABLET | ORAL | Status: DC | PRN

## 2018-06-21 MED ORDER — SODIUM CHLORIDE 0.9 % WEIGHT BASED INFUSION: 1.0000 mL/kg/h | INTRAVENOUS | Status: DC

## 2018-06-21 MED ORDER — SODIUM CHLORIDE 0.9 % IV SOLN
INTRAVENOUS | Status: DC
  Administered 2018-06-21: 10:00:00 via INTRAVENOUS

## 2018-06-21 MED ORDER — SODIUM CHLORIDE 0.9% FLUSH: 3.0000 mL | Freq: Two times a day (BID) | INTRAVENOUS | Status: DC

## 2018-06-21 MED ORDER — HEPARIN SODIUM (PORCINE) 1000 UNIT/ML IJ SOLN
INTRAMUSCULAR | Status: AC
  Filled 2018-06-21: qty 1

## 2018-06-21 MED ORDER — SODIUM CHLORIDE 0.9% FLUSH: 3.0000 mL | INTRAVENOUS | Status: DC | PRN

## 2018-06-21 MED ORDER — SODIUM CHLORIDE 0.9 % IV SOLN: 250.0000 mL | INTRAVENOUS | Status: DC | PRN

## 2018-06-21 MED ORDER — HYDRALAZINE HCL 20 MG/ML IJ SOLN: 5.0000 mg | INTRAMUSCULAR | Status: DC | PRN

## 2018-06-21 MED ORDER — CLOPIDOGREL BISULFATE 75 MG PO TABS: 75.0000 mg | ORAL_TABLET | Freq: Every day | ORAL | Status: DC

## 2018-06-21 MED ORDER — CLOPIDOGREL BISULFATE 75 MG PO TABS: 75.0000 mg | ORAL_TABLET | Freq: Every day | ORAL | 11 refills | Status: DC

## 2018-06-21 MED ORDER — HEPARIN (PORCINE) IN NACL 1000-0.9 UT/500ML-% IV SOLN
INTRAVENOUS | Status: DC | PRN
  Administered 2018-06-21 (×2): 500 mL

## 2018-06-21 MED ORDER — CLOPIDOGREL BISULFATE 75 MG PO TABS
300.0000 mg | ORAL_TABLET | Freq: Once | ORAL | Status: AC
  Administered 2018-06-21: 300 mg via ORAL
  Filled 2018-06-21: qty 4

## 2018-06-21 MED FILL — CLOPIDOGREL 75 MG TABLET: 75 | 30 days supply | Qty: 30 | Fill #0

## 2018-06-21 SURGICAL SUPPLY — 23 items
BALLN MUSTANG 6.0X20 75 (BALLOONS) ×3
BALLN MUSTANG 7.0X20 75 (BALLOONS) ×3
BALLOON MUSTANG 6.0X20 75 (BALLOONS) IMPLANT
BALLOON MUSTANG 7.0X20 75 (BALLOONS) IMPLANT
CATH OMNI FLUSH 5F 65CM (CATHETERS) ×1 IMPLANT
DEVICE CLOSURE MYNXGRIP 6/7F (Vascular Products) ×1 IMPLANT
FILTER CO2 0.2 MICRON (VASCULAR PRODUCTS) ×1 IMPLANT
KIT ENCORE 26 ADVANTAGE (KITS) ×1 IMPLANT
KIT MICROPUNCTURE NIT STIFF (SHEATH) ×1 IMPLANT
KIT PV (KITS) ×3 IMPLANT
RESERVOIR CO2 (VASCULAR PRODUCTS) ×1 IMPLANT
SET FLUSH CO2 (MISCELLANEOUS) ×1 IMPLANT
SHEATH FLEX ANSEL ANG 6F 45CM (SHEATH) ×1 IMPLANT
SHEATH PINNACLE 5F 10CM (SHEATH) ×1 IMPLANT
SHEATH PINNACLE 6F 10CM (SHEATH) ×1 IMPLANT
SHEATH PROBE COVER 6X72 (BAG) ×1 IMPLANT
STENT INNOVA 8X20X130 (Permanent Stent) ×1 IMPLANT
SYR MEDRAD MARK V 150ML (SYRINGE) ×3 IMPLANT
TRANSDUCER W/STOPCOCK (MISCELLANEOUS) ×3 IMPLANT
TRAY PV CATH (CUSTOM PROCEDURE TRAY) ×3 IMPLANT
WIRE AMPLATZ SS-J .035X180CM (WIRE) ×1 IMPLANT
WIRE BENTSON .035X145CM (WIRE) ×1 IMPLANT
WIRE HI TORQ VERSACORE 300 (WIRE) ×1 IMPLANT

## 2018-06-21 NOTE — Op Note (Signed)
.   Patient name: Robert Sanchez MRN: [id number] DOB: [birth year] Sex: male  06/21/2018 Pre-operative Diagnosis: Critical limb ischemia of the left lower extremity with rest pain Post-operative diagnosis:  Same Surgeon:  Cephus Shellinghristopher J. Nnamdi Dacus, MD Procedure Performed: 1.  Ultrasound-guided access of the right common femoral artery 2.  CO2 aortogram 3.  Left lower extremity arteriogram with selection of tertiary branches 4.  Left external iliac angioplasty with stent placement (8 mm x 20 mm Innova) 5.  Mynx closure of the right common femoral artery  Indications: Patient is a 62 year old male with multiple medical comorbidities.  He underwent a CABG approximately 10 days ago.  We were called over the weekend to evaluate his left lower extremity due to a severely reduced ABI of 0.3 and rest pain over the last month.  We subsequently recommended proceeding to the OR today for aortogram and lower extremity arteriogram.  We did use CO2 for the aortogram and as much of the arteriogram as possible given his renal insufficiency.  He understands the risks and benefits.  Findings:  1.  Patient had patent common iliac arteries bilaterally.  He had a focal high-grade stenosis of the proximal left external iliac artery.  The left hypogastric artery was patent but severely diseased. 2.  The left common femoral and the left profunda femoral artery were patent. 3.  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 4.  The above and below-knee popliteal arteries were patent but then he had severe trifurcation disease with no opacification of the proximal anterior tibial, proximal peroneal or tibial peroneal trunk. 5.  Predominant runoff in the left lower extremity was via the posterior tibial artery that filled into the plantar arch of the foot.  The anterior tibial  artery filled very late in the mid calf and had no filling into the foot distally.   Procedure:  The patient was identified in the holding area and taken to room 8.  The patient was then placed supine on the table and prepped and draped in the usual sterile fashion.  A time out was called.  Ultrasound was used to evaluate the right common femoral artery.  It was patent .  A digital ultrasound image was acquired.  A micropuncture needle was used to access the right common femoral artery under ultrasound guidance.  An 018 wire was advanced without resistance and a micropuncture sheath was placed.  The 018 wire was removed and a benson wire was placed.  The micropuncture sheath was exchanged for a 5 french sheath.  An omniflush catheter was advanced over the wire to the level of L-1.  An abdominal angiogram was obtained with CO2.  Most notably we saw a high-grade focal stenosis of the proximal left external iliac artery that was >90%.  Next, using the omniflush catheter and a benson wire, the aortic bifurcation was crossed and the catheter was placed into theleft external iliac artery and left runoff was obtained.  We initially used CO2 for the left lower extremity runoff.  We could visualize the common femoral as well as the profunda that was patent.  There was an approximate 14 cm occlusion of the mid left superficial femoral artery with reconstitution of the above-knee popliteal artery.  Ultimately in order to evaluate the left tibial runoff we selected the SFA and advanced a Omni Flush catheter into the SFA where  it was occluded.  Hand injected contrast imaging was then obtained of the left calf to evaluate tibial runoff.   Ultimately after evaluating all of his imaging we elected to treat his left external iliac stenosis since he only has rest pain and no wound.  I felt his SFA occlusion was likely too long for recanalization and not to mention that even after reconstitution of the above-knee popliteal artery  he had severe trifurcation disease below the knee.  At that point we exchanged our wire for a Amplatz through the Omni Flush catheter.  We then upsized to a long 6 [place] Ansell sheath in the right groin.  The patient was then given 9000 units of IV heparin.  We then used right anterior oblique to get more focal imaging of the left external iliac lesion.  We initially treated the focal stenosis that was greater than 90% with a 6 mm x 20 mm Mustang.  We then deployed a 8 mm x 20 mm self-expanding Innova stent.  We then postdilated the stent with a 7 mm x 20 mm Mustang balloon.  The normal native artery proximal to the stenosis measured approximately 6.5 mm in diameter.  A completion iliac arteriogram was obtained through the sheath injection that showed less than 30% residual stenosis with patent runoff below the infrarenal infrarenal ligament.  At this point we changed back for a short 6 French sheath in the right groin.  Our catheters and wires were removed.  A Mynx closure device was deployed in the right groin.  Patient was then taken to PACU in stable condition.  He had a posterior tibial signal at the completion of the case in his left foot.     Cephus Shellinghristopher J. Shenae Bonanno, MD [facility] [place] Office: [phone] Pager: [phone]  Cephus Shellinghristopher J Rajinder Mesick

## 2018-06-21 NOTE — H&P (Signed)
History and Physical Interval Note:  06/21/2018 10:26 AM  Robert Sanchez  has presented today for surgery, with the diagnosis of pvd ulcer  The various methods of treatment have been discussed with the patient and family. After consideration of risks, benefits and other options for treatment, the patient has consented to  Procedure(s): ABDOMINAL AORTOGRAM W/LOWER EXTREMITY (N/A) as a surgical intervention .  The patient's history has been reviewed, patient examined, no change in status, stable for surgery.  I have reviewed the patient's chart and labs.  Questions were answered to the patient's satisfaction.     LLE arteriogram with possible intervention.  Will shoot runoff in both legs.   Cephus Shelling    [facility] NOTE  MRN : [id number]  Reason for Consult: left toe rest pain  Referring Physician: Dr. Morton Peters  History of Present Illness: 62 y/o male s/p CABG with reported left toe pain and feeling of ice cold sensation. He states this is a new symptom since his heart bypass surgery. He has a history of right LE peripheral neuropathy. He denise non healing ulcers, loss of sensation, loss of motor, or reproducible calf pain with ambulation.  Past medical history DM, CAD POD # 4 CABG, SOB, CXR showed likely pulmonary edema in the ED. Home medication to date Metformin.           Current Facility-Administered Medications  Medication Dose Route Frequency Provider Last Rate Last Dose  . 0.45 % sodium chloride infusion  Intravenous Continuous PRN Sharlene Dory, PA-C  Stopped at 06/13/18 2001  . 0.9 % sodium chloride infusion 250 mL Intravenous Continuous Conte, Tessa N, PA-C    . 0.9 % sodium chloride infusion  Intravenous Continuous Sharlene Dory, PA-C 10 mL/hr at 06/15/18 2120   . acetaminophen (TYLENOL) tablet 1,000 mg 1,000 mg Oral Q6H Asa Lente, Tessa N, PA-C  1,000 mg at 06/16/18 1157   Or  . acetaminophen (TYLENOL) solution 1,000 mg 1,000  mg Per Tube Q6H Conte, Tessa N, PA-C    . amiodarone (PACERONE) tablet 400 mg 400 mg Oral BID Kerin Perna, MD    . aspirin EC tablet 325 mg 325 mg Oral Daily Jari Favre N, [place]  325 mg at 06/16/18 1011   Or  . aspirin chewable tablet 324 mg 324 mg Per Tube Daily Conte, Tessa N, PA-C    . atorvastatin (LIPITOR) tablet 40 mg 40 mg Oral q1800 Conte, Tessa N, PA-C  40 mg at 06/15/18 1731  . bisacodyl (DULCOLAX) EC tablet 10 mg 10 mg Oral Daily Jari Favre N, PA-C  10 mg at 06/15/18 4098   Or  . bisacodyl (DULCOLAX) suppository 10 mg 10 mg Rectal Daily Asa Lente, Tessa N, PA-C  10 mg at 06/16/18 1011  . carvedilol (COREG) tablet 12.5 mg 12.5 mg Oral BID WC Donata Clay, Theron Arista, MD    . Chlorhexidine Gluconate Cloth 2 % PADS 6 each 6 each Topical Daily Kerin Perna, MD  6 each at 06/15/18 1652  . docusate sodium (COLACE) capsule 200 mg 200 mg Oral Daily Conte, Tessa N, PA-C  200 mg at 06/16/18 1010  . [START ON 06/17/2018] furosemide (LASIX) tablet 40 mg 40 mg Oral Daily Orpah Cobb, MD    . insulin aspart (novoLOG) injection 0-24 Units 0-24 Units Subcutaneous Q4H Kerin Perna, MD  2 Units at 06/16/18 1157  . insulin aspart (novoLOG) injection 4 Units 4 Units Subcutaneous TID WC  Kerin Perna, MD  4 Units at 06/16/18 1201  . insulin detemir (LEVEMIR) injection 15 Units 15 Units Subcutaneous BID Kerin Perna, MD  15 Units at 06/16/18 1013  . levalbuterol (XOPENEX) nebulizer solution 0.63 mg 0.63 mg Nebulization Q6H PRN Kerin Perna, MD    . MEDLINE mouth rinse 15 mL Mouth Rinse BID Kerin Perna, MD  15 mL at 06/15/18 2114  . metoCLOPramide (REGLAN) injection 10 mg 10 mg Intravenous Q6H Kerin Perna, MD  10 mg at 06/16/18 1158  . metoprolol tartrate (LOPRESSOR) injection 2.5-5 mg 2.5-5 mg Intravenous Q5 min PRN Conte, Tessa N, PA-C    . milrinone (PRIMACOR) 20 MG/100 ML (0.2 mg/mL) infusion 0.125 mcg/kg/min Intravenous Continuous Donata Clay, Theron Arista, MD 3.6 mL/hr at 06/16/18 0000  0.125 mcg/kg/min at 06/16/18 0000  . mometasone-formoterol (DULERA) 200-5 MCG/ACT inhaler 2 puff 2 puff Inhalation BID Donata Clay, Theron Arista, MD  2 puff at 06/16/18 [phone]  . morphine 2 MG/ML injection 2-5 mg 2-5 mg Intravenous Q1H PRN Jari Favre N, PA-C  4 mg at 06/13/18 1338  . ondansetron (ZOFRAN) injection 4 mg 4 mg Intravenous Q6H PRN Asa Lente, Tessa N, PA-C    . oxyCODONE (Oxy IR/ROXICODONE) immediate release tablet 5-10 mg 5-10 mg Oral Q3H PRN Asa Lente, Tessa N, PA-C  10 mg at 06/14/18 1149  . pantoprazole (PROTONIX) EC tablet 40 mg 40 mg Oral Daily Jari Favre N, PA-C  40 mg at 06/16/18 1011  . potassium chloride SA (K-DUR,KLOR-CON) CR tablet 20 mEq 20 mEq Oral BID Donata Clay, Theron Arista, MD  20 mEq at 06/16/18 1010  . sodium chloride flush (NS) 0.9 % injection 10-40 mL 10-40 mL Intracatheter Q12H Kerin Perna, MD  10 mL at 06/13/18 2107  . sodium chloride flush (NS) 0.9 % injection 10-40 mL 10-40 mL Intracatheter PRN Donata Clay, Theron Arista, MD    . sodium chloride flush (NS) 0.9 % injection 10-40 mL 10-40 mL Intracatheter Q12H Kerin Perna, MD  10 mL at 06/15/18 2115  . sodium chloride flush (NS) 0.9 % injection 10-40 mL 10-40 mL Intracatheter PRN Donata Clay, Theron Arista, MD    . sodium chloride flush (NS) 0.9 % injection 3 mL 3 mL Intravenous Q12H Conte, Tessa N, PA-C  3 mL at 06/14/18 0930  . sodium chloride flush (NS) 0.9 % injection 3 mL 3 mL Intravenous PRN Conte, Tessa N, PA-C    . traMADol Janean Sark) tablet 50-100 mg 50-100 mg Oral Q4H PRN Asa Lente, Tessa N, PA-C  100 mg at 06/13/18 9604   Pt meds include:  Statin :No  Betablocker: No  ASA: No  Other anticoagulants/antiplatelets: none      Past Medical History:  Diagnosis Date  . Acute pulmonary edema (HCC) 06/05/2018  . Arthritis   . Diabetes mellitus without complication (HCC)   . Diabetic neuropathy [facility])         Past Surgical History:  Procedure Laterality Date  . CORONARY ARTERY BYPASS GRAFT N/A 06/12/2018   Procedure: CORONARY ARTERY  BYPASS GRAFTING (CABG) x 3 WITH ENDOSCOPIC HARVESTING OF RIGHT GREATER SAPHENOUS VEIN. LIMA TO LAD. SVG TO PD. SVG TO DIAGONAL.; Surgeon: Kerin Perna, MD; Location: [facility] OR; Service: Open Heart Surgery; Laterality: N/A;  . KNEE ARTHROSCOPY    . RIGHT/LEFT HEART CATH AND CORONARY ANGIOGRAPHY N/A 06/05/2018   Procedure: RIGHT/LEFT HEART CATH AND CORONARY ANGIOGRAPHY; Surgeon: Orpah Cobb, MD; Location: [facility] INVASIVE CV LAB; Service: Cardiovascular; Laterality: N/A;  . SPINE SURGERY    .  TEE WITHOUT CARDIOVERSION N/A 06/12/2018   Procedure: TRANSESOPHAGEAL ECHOCARDIOGRAM (TEE); Surgeon: Donata ClayVan Trigt, Theron AristaPeter, MD; [facility]; Service: Open Heart Surgery; Laterality: N/A;   Social History  Social History        Tobacco Use  . Smoking status: Current Every Day Smoker    Packs/day: 1.00    Years: 40.00    Pack years: 40.00  . Smokeless tobacco: Never Used  Substance Use Topics  . Alcohol use: Yes  . Drug use: No   Family History       Family History  Problem Relation Age of Onset  . Diabetes Mother   . Cancer Father   . Heart disease Father         Allergies  Allergen Reactions  . Lisinopril Other (See Comments)    Syncope  . Codeine Rash   REVIEW OF SYSTEMS  General: [ ]  Weight loss, [ ]  Fever, [ ]  chills  Neurologic: [ ]  Dizziness, [ ]  Blackouts, [ ]  Seizure  [ ]  Stroke, [ ]  "Mini stroke", [ ]  Slurred speech, [ ]  Temporary blindness; [ ]  weakness in arms or legs, [ ]  Hoarseness [ ]  Dysphagia  Cardiac: [x ] Chest pain/pressure, x[ ]  Shortness of breath at rest [ ]  Shortness of breath with exertion, [ ]  Atrial fibrillation or irregular heartbeat  Vascular: [ ]  Pain in legs with walking, [ ]  Pain in legs at rest, [ ]  Pain in legs at night,  [ ]  Non-healing ulcer, [ ]  Blood clot in vein/DVT,  Pulmonary: [ ]  Home oxygen, [ ]  Productive cough, [ ]  Coughing up blood, [ ]  Asthma,  [ ]  Wheezing [ ]  COPD  Musculoskeletal: [ ]  Arthritis, [ ]  Low back pain, [ ]  Joint pain  Hematologic:  [ ]  Easy Bruising, [ ]  Anemia; [ ]  Hepatitis  Gastrointestinal: [ ]  Blood in stool, [ ]  Gastroesophageal Reflux/heartburn,  Urinary: [ ]  chronic Kidney disease, [ ]  on HD - [ ]  MWF or [ ]  TTHS, [ ]  Burning with urination, [ ]  Difficulty urinating  Skin: [ ]  Rashes, [ ]  Wounds  Psychological: [ ]  Anxiety, [ ]  Depression  Physical Examination        Vitals:   06/16/18 0331 06/16/18 0821 06/16/18 0833 06/16/18 1256  BP: 129/73  112/63 114/63  Pulse: 80  89 82  Resp: 16  16 20   Temp: 98.3 F (36.8 C)  97.7 F (36.5 C) (!) 97.4 F (36.3 C)  TempSrc: Oral  Oral Oral  SpO2: 97% 93% 95% 95%  Weight: 94.6 kg     Height:       Body mass index is 31.72 kg/m.  General: WDWN in NAD  Gait: not observed  HENT: WNL, normocephalic  Eyes: Pupils equal  Pulmonary: normal non-labored breathing  Cardiac: RRR, without Murmurs, rubs or gallops;, well healing chest incision  No carotid bruits  Abdomen: soft, NT, no masses  Skin: no rashes, ulcers noted; no Gangrene , no cellulitis; no open wounds;  Vascular Exam/Pulses:palpable femoral pulses B, doppler DP/PT right > left signals  Musculoskeletal: no muscle wasting or atrophy; minimal edema  Neurologic: A&O X 3; Appropriate Affect ;  SENSATION: normal;  MOTOR FUNCTION: grossly 5/5 Symmetric  Speech is fluent/normal  Significant Diagnostic Studies:  CBC  Recent Labs  BMET  Labs (Brief)                                                                                               Estimated Creatinine Clearance: 68.5 mL/min (A) (by C-G formula based on SCr of 1.28 mg/dL (H)).  COAG  Recent Labs                                   Non-Invasive Vascular Imaging:  ABI  Left 0.38  Right 0.71  Carotid duplex  Right Carotid: Velocities in the right ICA are consistent with a 1-39% stenosis.  Left Carotid: Velocities in the left ICA are consistent with a 1-39% stenosis.   ASSESSMENT/PLAN:  PAD with infrainguinal disease  He has palpable femoral pulses bilaterally. He has active range of motion of the the left foot. He states he has ambulated in the halls multiple times. He is not at risk for limb loss and has good doppler signals. There are no non healing wounds on the left foot.  We will order an arterial duplex. Pending further work up and plan for angiogram with run off bilaterally LE with possible intervention. If he is discharged we can schedule this as an out patient on Wednesday 06/21/2018.  Mosetta Pigeonmma Maureen Collins  06/16/2018  1:34 PM  I have seen and evaluated the patient. I agree with the PA note as documented above. Describes one month of rest pain in left foot, ABI 0.3 vs 0.7 right foot. Biphasic DP/PT signals in left foot with no tissue loss. One week post-op s/p CABG. Will obtain arterial duplex of left leg. Likely plan for outpatient arteriogram this week.  Cephus Shellinghristopher J. Gladiola Madore, MD  [facility] [place]  Office: [phone]  Pager: [phone]

## 2018-06-21 NOTE — Discharge Instructions (Signed)
Angiogram, Care After °This sheet gives you information about how to care for yourself after your procedure. Your health care provider may also give you more specific instructions. If you have problems or questions, contact your health care provider. °What can I expect after the procedure? °After the procedure, it is common to have bruising and tenderness at the catheter insertion area. °Follow these instructions at home: °Insertion site care °· Follow instructions from your health care provider about how to take care of your insertion site. Make sure you: °? Wash your hands with soap and water before you change your bandage (dressing). If soap and water are not available, use hand sanitizer. °? Change your dressing as told by your health care provider. °? Leave stitches (sutures), skin glue, or adhesive strips in place. These skin closures may need to stay in place for 2 weeks or longer. If adhesive strip edges start to loosen and curl up, you may trim the loose edges. Do not remove adhesive strips completely unless your health care provider tells you to do that. °· Do not take baths, swim, or use a hot tub until your health care provider approves. °· You may shower 24-48 hours after the procedure or as told by your health care provider. °? Gently wash the site with plain soap and water. °? Pat the area dry with a clean towel. °? Do not rub the site. This may cause bleeding. °· Do not apply powder or lotion to the site. Keep the site clean and dry. °· Check your insertion site every day for signs of infection. Check for: °? Redness, swelling, or pain. °? Fluid or blood. °? Warmth. °? Pus or a bad smell. °Activity °· Rest as told by your health care provider, usually for 1-2 days. °· Do not lift anything that is heavier than 10 lbs. (4.5 kg) or as told by your health care provider. °· Do not drive for 24 hours if you were given a medicine to help you relax (sedative). °· Do not drive or use heavy machinery while  taking prescription pain medicine. °General instructions °· Return to your normal activities as told by your health care provider, usually in about a week. Ask your health care provider what activities are safe for you. °· If the catheter site starts bleeding, lie flat and put pressure on the site. If the bleeding does not stop, get help right away. This is a medical emergency. °· Drink enough fluid to keep your urine clear or pale yellow. This helps flush the contrast dye from your body. °· Take over-the-counter and prescription medicines only as told by your health care provider. °· Keep all follow-up visits as told by your health care provider. This is important. °Contact a health care provider if: °· You have a fever or chills. °· You have redness, swelling, or pain around your insertion site. °· You have fluid or blood coming from your insertion site. °· The insertion site feels warm to the touch. °· You have pus or a bad smell coming from your insertion site. °· You have bruising around the insertion site. °· You notice blood collecting in the tissue around the catheter site (hematoma). The hematoma may be painful to the touch. °Get help right away if: °· You have severe pain at the catheter insertion area. °· The catheter insertion area swells very fast. °· The catheter insertion area is bleeding, and the bleeding does not stop when you hold steady pressure on the area. °·   The area near or just beyond the catheter insertion site becomes pale, cool, tingly, or numb. These symptoms may represent a serious problem that is an emergency. Do not wait to see if the symptoms will go away. Get medical help right away. Call your local emergency services (911 in the [place] Do not drive yourself to the hospital. Summary  After the procedure, it is common to have bruising and tenderness at the catheter insertion area.  After the procedure, it is important to rest and drink plenty of fluids.  Do not take baths,  swim, or use a hot tub until your health care provider says it is okay to do so. You may shower 24-48 hours after the procedure or as told by your health care provider.  If the catheter site starts bleeding, lie flat and put pressure on the site. If the bleeding does not stop, get help right away. This is a medical emergency. This information is not intended to replace advice given to you by your health care provider. Make sure you discuss any questions you have with your health care provider. Document Released: 05/06/2005 Document Revised: 09/22/2016 Document Reviewed: 09/22/2016 Elsevier Interactive Patient Education  2018 [employer] Clopidogrel tablets What is this medicine? CLOPIDOGREL (kloh PID oh grel) helps to prevent blood clots. This medicine is used to prevent heart attack, stroke, or other vascular events in people who are at high risk. This medicine may be used for other purposes; ask your health care provider or pharmacist if you have questions. COMMON BRAND NAME(S): Plavix What should I tell my health care provider before I take this medicine? They need to know if you have any of the following conditions: -bleeding disorders -bleeding in the brain -having surgery -history of stomach bleeding -an unusual or allergic reaction to clopidogrel, other medicines, foods, dyes, or preservatives -pregnant or trying to get pregnant -breast-feeding How should I use this medicine? Take this medicine by mouth with a glass of water. Follow the directions on the prescription label. You may take this medicine with or without food. If it upsets your stomach, take it with food. Take your medicine at regular intervals. Do not take it more often than directed. Do not stop taking except on your doctor's advice. A special MedGuide will be given to you by the pharmacist with each prescription and refill. Be sure to read this information carefully each time. Talk to your pediatrician regarding the  use of this medicine in children. Special care may be needed. Overdosage: If you think you have taken too much of this medicine contact a poison control center or emergency room at once. NOTE: This medicine is only for you. Do not share this medicine with others. What if I miss a dose? If you miss a dose, take it as soon as you can. If it is almost time for your next dose, take only that dose. Do not take double or extra doses. What may interact with this medicine? Do not take this medicine with the following medications: -dasabuvir; ombitasvir; paritaprevir; ritonavir -defibrotide This medicine may also interact with the following medications: -antiviral medicines for HIV or AIDS -aspirin -certain medicines for depression like citalopram, fluoxetine, fluvoxamine -certain medicines for fungal infections like ketoconazole, fluconazole, voriconazole -certain medicines for seizures like felbamate, oxcarbazepine, phenytoin -certain medicines for stomach problems like cimetidine, omeprazole, esomeprazole -certain medicines that treat or prevent blood clots like warfarin, enoxaparin, dalteparin, apixaban, dabigatran, rivaroxaban, ticlopidine -chloramphenicol -cilostazol -fluvastatin -isoniazid -modafinil -nicardipine -NSAIDS, medicines for pain  and inflammation, like ibuprofen or naproxen -quinine -repaglinide -tamoxifen -tolbutamide -topiramate -torsemide This list may not describe all possible interactions. Give your health care provider a list of all the medicines, herbs, non-prescription drugs, or dietary supplements you use. Also tell them if you smoke, drink alcohol, or use illegal drugs. Some items may interact with your medicine. What should I watch for while using this medicine? Visit your doctor or health care professional for regular check ups. Do not stop taking your medicine unless your doctor tells you to. Notify your doctor or health care professional and seek emergency  treatment if you develop breathing problems; changes in vision; chest pain; severe, sudden headache; pain, swelling, warmth in the leg; trouble speaking; sudden numbness or weakness of the face, arm or leg. These can be signs that your condition has gotten worse. If you are going to have surgery or dental work, tell your doctor or health care professional that you are taking this medicine. Certain genetic factors may reduce the effect of this medicine. Your doctor may use genetic tests to determine treatment. What side effects may I notice from receiving this medicine? Side effects that you should report to your doctor or health care professional as soon as possible: -allergic reactions like skin rash, itching or hives, swelling of the face, lips, or tongue -signs and symptoms of bleeding such as bloody or black, tarry stools; red or dark-brown urine; spitting up blood or brown material that looks like coffee grounds; red spots on the skin; unusual bruising or bleeding from the eye, gums, or nose -signs and symptoms of a blood clot such as breathing problems; changes in vision; chest pain; severe, sudden headache; pain, swelling, warmth in the leg; trouble speaking; sudden numbness or weakness of the face, arm or leg Side effects that usually do not require medical attention (report to your doctor or health care professional if they continue or are bothersome): -constipation -diarrhea -headache -upset stomach This list may not describe all possible side effects. Call your doctor for medical advice about side effects. You may report side effects to FDA at 1-800-FDA-1088. Where should I keep my medicine? Keep out of the reach of children. Store at room temperature of 59 to 86 degrees F (15 to 30 degrees C). Throw away any unused medicine after the expiration date. NOTE: This sheet is a summary. It may not cover all possible information. If you have questions about this medicine, talk to your doctor,  pharmacist, or health care provider.  2018 Elsevier/Gold Standard (2015-07-24 10:00:44)

## 2018-06-21 NOTE — Telephone Encounter (Signed)
sch appt lvm 07/18/18 3pm ABI 345pm f/u MD

## 2018-06-22 ENCOUNTER — Encounter (HOSPITAL_COMMUNITY): Payer: Self-pay | Admitting: Vascular Surgery

## 2018-06-22 ENCOUNTER — Other Ambulatory Visit: Payer: Self-pay

## 2018-06-22 ENCOUNTER — Emergency Department (HOSPITAL_COMMUNITY)
Admission: EM | Admit: 2018-06-22 | Discharge: 2018-06-23 | Disposition: A | Discharge status: Home / Self Care | Payer: Self-pay | Attending: Emergency Medicine | Admitting: Emergency Medicine

## 2018-06-22 ENCOUNTER — Emergency Department (HOSPITAL_COMMUNITY): Payer: Self-pay

## 2018-06-22 DIAGNOSIS — Z7982 Long term (current) use of aspirin: Secondary | ICD-10-CM | POA: Insufficient documentation

## 2018-06-22 DIAGNOSIS — Z7984 Long term (current) use of oral hypoglycemic drugs: Secondary | ICD-10-CM | POA: Insufficient documentation

## 2018-06-22 DIAGNOSIS — R0602 Shortness of breath: Secondary | ICD-10-CM | POA: Insufficient documentation

## 2018-06-22 DIAGNOSIS — Z7902 Long term (current) use of antithrombotics/antiplatelets: Secondary | ICD-10-CM | POA: Insufficient documentation

## 2018-06-22 DIAGNOSIS — I70229 Atherosclerosis of native arteries of extremities with rest pain, unspecified extremity: Secondary | ICD-10-CM

## 2018-06-22 DIAGNOSIS — E1140 Type 2 diabetes mellitus with diabetic neuropathy, unspecified: Secondary | ICD-10-CM | POA: Insufficient documentation

## 2018-06-22 DIAGNOSIS — G4730 Sleep apnea, unspecified: Secondary | ICD-10-CM | POA: Insufficient documentation

## 2018-06-22 DIAGNOSIS — Z951 Presence of aortocoronary bypass graft: Secondary | ICD-10-CM | POA: Insufficient documentation

## 2018-06-22 DIAGNOSIS — I998 Other disorder of circulatory system: Secondary | ICD-10-CM

## 2018-06-22 DIAGNOSIS — Z87891 Personal history of nicotine dependence: Secondary | ICD-10-CM | POA: Insufficient documentation

## 2018-06-22 LAB — COMPREHENSIVE METABOLIC PANEL
ALBUMIN: 3.1 g/dL — AB (ref 3.5–5.0)
ALK PHOS: 70 U/L (ref 38–126)
ALT: 33 U/L (ref 0–44)
AST: 18 U/L (ref 15–41)
Anion gap: 10 (ref 5–15)
BILIRUBIN TOTAL: 0.6 mg/dL (ref 0.3–1.2)
BUN: 27 mg/dL — AB (ref 6–20)
CALCIUM: 9.1 mg/dL (ref 8.9–10.3)
CO2: 28 mmol/L (ref 22–32)
CREATININE: 1.69 mg/dL — AB (ref 0.61–1.24)
Chloride: 98 mmol/L (ref 98–111)
GFR calc Af Amer: 49 mL/min — ABNORMAL LOW (ref >60)
GFR, EST NON AFRICAN AMERICAN: 42 mL/min — AB (ref >60)
GLUCOSE: 263 mg/dL — AB (ref 70–99)
Potassium: 4.2 mmol/L (ref 3.5–5.1)
Sodium: 136 mmol/L (ref 135–145)
TOTAL PROTEIN: 7.9 g/dL (ref 6.5–8.1)

## 2018-06-22 LAB — I-STAT TROPONIN, ED: TROPONIN I, POC: 0.02 ng/mL (ref 0.00–0.08)

## 2018-06-22 LAB — CBC WITH DIFFERENTIAL/PLATELET
ABS IMMATURE GRANULOCYTES: 0.1 10*3/uL (ref 0.0–0.1)
Basophils Absolute: 0.1 10*3/uL (ref 0.0–0.1)
Basophils Relative: 1 %
EOS PCT: 3 %
Eosinophils Absolute: 0.3 10*3/uL (ref 0.0–0.7)
HEMATOCRIT: 34.3 % — AB (ref 39.0–52.0)
HEMOGLOBIN: 10.7 g/dL — AB (ref 13.0–17.0)
IMMATURE GRANULOCYTES: 0 %
Lymphocytes Relative: 15 %
Lymphs Abs: 1.9 10*3/uL (ref 0.7–4.0)
MCH: 29.2 pg (ref 26.0–34.0)
MCHC: 31.2 g/dL (ref 30.0–36.0)
MCV: 93.7 fL (ref 78.0–100.0)
MONO ABS: 1.1 10*3/uL — AB (ref 0.1–1.0)
Monocytes Relative: 9 %
NEUTROS ABS: 9 10*3/uL — AB (ref 1.7–7.7)
Neutrophils Relative %: 72 %
Platelets: 618 10*3/uL — ABNORMAL HIGH (ref 150–400)
RBC: 3.66 MIL/uL — AB (ref 4.22–5.81)
RDW: 12.7 % (ref 11.5–15.5)
WBC: 12.5 10*3/uL — ABNORMAL HIGH (ref 4.0–10.5)

## 2018-06-22 LAB — BRAIN NATRIURETIC PEPTIDE: B Natriuretic Peptide: 579.7 pg/mL — ABNORMAL HIGH (ref 0.0–100.0)

## 2018-06-22 NOTE — ED Provider Notes (Signed)
Patient placed in Quick Look pathway, seen and evaluated   Chief Complaint: Shortness of breath/orthopnea  HPI:   Patient states for the past week, he has been having shortness of breath at night.  He wakes up gasping for air.  This is never happened before.  He has a significant cardiac history in the past month, including cath (several wks ago) and left leg stent placement (yesterday).  He denies shortness of breath currently.  He denies chest pain.  He denies dyspnea on exertion.  He just wanted to get checked out considering his significant history.  ROS: sob  Physical Exam:   Gen: No distress  Neuro: Awake and Alert  Skin: Warm  Pulm: speaking in full sentences. Clear lung sounds in all fields.   CV: RRR   Initiation of care has begun. The patient has been counseled on the process, plan, and necessity for staying for the completion/evaluation, and the remainder of the medical screening examination    Tatjana Turcott, PA-C 08Alveria Apley/22/19 2029    Mesner, Barbara CowerJason, MD 06/23/18 [phone]

## 2018-06-22 NOTE — ED Notes (Signed)
Pt ambulated in hall, 02 sat's 97%, 98% and 99%   Pt tolerated well

## 2018-06-22 NOTE — ED Triage Notes (Addendum)
Pt reports recent cardiac hospital admission with stent placement. Pt reports since discharge he has been sob and sleep apnea. Pt started Plavix yesterday and takes asa. Denies pain and any other sx. Respirations regular and unlabored in triage.

## 2018-06-23 LAB — I-STAT TROPONIN, ED: Troponin i, poc: 0.01 ng/mL (ref 0.00–0.08)

## 2018-06-23 NOTE — ED Provider Notes (Addendum)
[facility] EMERGENCY DEPARTMENT Provider Note   CSN: [id number] Arrival date & time: 06/22/18  1810     History   Chief Complaint Chief Complaint  Patient presents with  . Sleep Apnea  . Shortness of Breath    HPI Robert Sanchez is a 62 y.o. male.  62 year old male with prior medical history as detailed below presents with complaint of shortness of breath.  Patient reports intermittent shortness of breath that bothers him when he is asleep.  When he is awake he has no shortness of breath, chest pain, or other complaint.  Patient reports that over the last several days he has had several episodes where he wakes up and he feels like he cannot catch his breath.  This symptom passes very quickly - episodes last several seconds.  He is currently comfortable and without complaint.  The history is provided by the patient and medical records.  Shortness of Breath  This is a new problem. The average episode lasts 10 seconds. The problem occurs rarely.The current episode started more than 2 days ago. The problem has been resolved. Pertinent negatives include no fever, no sputum production, no chest pain, no leg pain and no leg swelling. He has tried nothing for the symptoms.    Past Medical History:  Diagnosis Date  . Acute pulmonary edema (HCC) 06/05/2018  . Arthritis   . Diabetes mellitus without complication (HCC)   . Diabetic neuropathy [facility])     Patient Active Problem List   Diagnosis Date Noted  . 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 Acute coronary syndrome (HCC) 06/05/2018  . Acute pulmonary edema (HCC) 06/03/2018  . DKA, type 2 (HCC) 06/03/2018  . Diabetic acidosis without coma (HCC)   . Sebaceous cyst 08/23/2016  . Acute pain of right knee 08/23/2016  . Hidradenitis 08/23/2016  . Right hip pain 08/14/2014  . Diabetes (HCC) 11/27/2013  . Arthritis 11/27/2013  . Neuropathy in diabetes (HCC) 11/27/2013    Past Surgical History:  Procedure Laterality Date  .  ABDOMINAL AORTOGRAM W/LOWER EXTREMITY N/A 06/21/2018   Procedure: ABDOMINAL AORTOGRAM W/LOWER EXTREMITY;  Surgeon: Cephus Shelling, MD;  Location: [facility] INVASIVE CV LAB;  Service: Cardiovascular;  Laterality: N/A;  . CORONARY ARTERY BYPASS GRAFT N/A 06/12/2018   Procedure: CORONARY ARTERY BYPASS GRAFTING (CABG) x 3 WITH ENDOSCOPIC HARVESTING OF RIGHT GREATER SAPHENOUS VEIN. LIMA TO LAD. SVG TO PD. SVG TO DIAGONAL.;  Surgeon: Kerin Perna, MD;  Location: [facility] OR;  Service: Open Heart Surgery;  Laterality: N/A;  . KNEE ARTHROSCOPY    . PERIPHERAL VASCULAR INTERVENTION Left 06/21/2018   Procedure: PERIPHERAL VASCULAR INTERVENTION;  Surgeon: Cephus Shelling, MD;  Location: [facility] INVASIVE CV LAB;  Service: Cardiovascular;  Laterality: Left;  . RIGHT/LEFT HEART CATH AND CORONARY ANGIOGRAPHY N/A 06/05/2018   Procedure: RIGHT/LEFT HEART CATH AND CORONARY ANGIOGRAPHY;  Surgeon: Orpah Cobb, MD;  Location: [facility] INVASIVE CV LAB;  Service: Cardiovascular;  Laterality: N/A;  . SPINE SURGERY    . TEE WITHOUT CARDIOVERSION N/A 06/12/2018   Procedure: TRANSESOPHAGEAL ECHOCARDIOGRAM (TEE);  Surgeon: Donata Clay, Theron Arista, MD;  Location: [facility] OR;  Service: Open Heart Surgery;  Laterality: N/A;        Home Medications    Prior to Admission medications   Medication Sig Start Date End Date Taking? Authorizing Provider  amiodarone (PACERONE) 400 MG tablet Take 1 tablet (400 mg total) by mouth 2 (two) times daily. For 7 days, then decrease to 400 mg daily 06/19/18  Yes Barrett, Erin R, PA-C  aspirin EC 325 MG EC tablet Take 1 tablet (325 mg total) by mouth daily. 06/20/18  Yes Barrett, Erin R, PA-C  atorvastatin (LIPITOR) 40 MG tablet Take 1 tablet (40 mg total) by mouth daily at 6 PM. 06/19/18  Yes Barrett, Erin R, PA-C  carvedilol (COREG) 12.5 MG tablet Take 1 tablet (12.5 mg total) by mouth 2 (two) times daily with a meal. 06/19/18  Yes Barrett, Erin R, PA-C  clopidogrel (PLAVIX) 75 MG tablet Take 1 tablet (75 mg total)  by mouth daily. 06/21/18 06/21/19 Yes Cephus Shellinglark, Christopher J, MD  furosemide (LASIX) 40 MG tablet Take 1 tablet (40 mg total) by mouth daily. 06/20/18  Yes Barrett, Erin R, PA-C  metFORMIN (GLUCOPHAGE) 1000 MG tablet Take 1 tablet (1,000 mg total) by mouth 2 (two) times daily with a meal. 08/23/16  Yes Myra RudeSchmitz, Jeremy E, MD  potassium chloride SA (K-DUR,KLOR-CON) 20 MEQ tablet Take 1 tablet (20 mEq total) by mouth daily. 06/19/18  Yes Barrett, Erin R, PA-C  traMADol (ULTRAM) 50 MG tablet Take 1 tablet (50 mg total) by mouth every 4 (four) hours as needed for moderate pain. 06/19/18  Yes Barrett, Rae RoamErin R, PA-C    Family History Family History  Problem Relation Age of Onset  . Diabetes Mother   . Cancer Father   . Heart disease Father     Social History Social History   Tobacco Use  . Smoking status: Former Smoker    Packs/day: 1.00    Years: 40.00    Pack years: 40.00    Last attempt to quit: 05/23/2018    Years since quitting: 0.0  . Smokeless tobacco: Never Used  Substance Use Topics  . Alcohol use: Not Currently  . Drug use: No     Allergies   Lisinopril and Codeine   Review of Systems Review of Systems  Constitutional: Negative for fever.  Respiratory: Positive for shortness of breath. Negative for sputum production.   Cardiovascular: Negative for chest pain and leg swelling.  All other systems reviewed and are negative.    Physical Exam Updated Vital Signs BP (!) 114/59   Pulse 70   Temp 98.3 F (36.8 C) (Oral)   Resp 20   Ht 5\' 8"  (1.727 m)   Wt 91.6 kg   SpO2 93%   BMI 30.71 kg/m   Physical Exam  Constitutional: He is oriented to person, place, and time. He appears well-developed and well-nourished. No distress.  HENT:  Head: Normocephalic and atraumatic.  Mouth/Throat: Oropharynx is clear and moist.  Eyes: Pupils are equal, round, and reactive to light. Conjunctivae and EOM are normal.  Neck: Normal range of motion. Neck supple.  Cardiovascular: Normal  rate, regular rhythm and normal heart sounds.  Pulmonary/Chest: Effort normal and breath sounds normal. No respiratory distress.  Abdominal: Soft. He exhibits no distension. There is no tenderness.  Musculoskeletal: Normal range of motion. He exhibits no edema or deformity.  Neurological: He is alert and oriented to person, place, and time.  Skin: Skin is warm and dry.  Psychiatric: He has a normal mood and affect.  Nursing note and vitals reviewed.    ED Treatments / Results  Labs (all labs ordered are listed, but only abnormal results are displayed) Labs Reviewed  CBC WITH DIFFERENTIAL/PLATELET - Abnormal; Notable for the following components:      Result Value   WBC 12.5 (*)    RBC 3.66 (*)    Hemoglobin 10.7 (*)  HCT 34.3 (*)    Platelets 618 (*)    Neutro Abs 9.0 (*)    Monocytes Absolute 1.1 (*)    All other components within normal limits  COMPREHENSIVE METABOLIC PANEL - Abnormal; Notable for the following components:   Glucose, Bld 263 (*)    BUN 27 (*)    Creatinine, Ser 1.69 (*)    Albumin 3.1 (*)    GFR calc non Af Amer 42 (*)    GFR calc Af Amer 49 (*)    All other components within normal limits  BRAIN NATRIURETIC PEPTIDE - Abnormal; Notable for the following components:   B Natriuretic Peptide 579.7 (*)    All other components within normal limits  I-STAT TROPONIN, ED  I-STAT TROPONIN, ED    EKG EKG Interpretation  Date/Time:  Thursday June 22 2018 18:15:10 EDT Ventricular Rate:  77 PR Interval:  180 QRS Duration: 104 QT Interval:  428 QTC Calculation: 484 R Axis:   110 Text Interpretation:  Normal sinus rhythm Left posterior fascicular block Possible Inferior infarct , age undetermined Cannot rule out Anterior infarct , age undetermined Abnormal ECG Confirmed by Kristine Royal [phone]) on 06/22/2018 9:59:15 PM   Radiology Dg Chest 2 View  Result Date: 06/22/2018 CLINICAL DATA:  Recent stent placement.  Shortness of breath. EXAM: CHEST - 2 VIEW  COMPARISON:  06/16/2018 FINDINGS: Sternotomy wires unchanged. Lungs are adequately inflated demonstrate mild prominence of the perihilar markings left worse than right without significant change likely mild vascular congestion. Small amount left pleural fluid/atelectasis without significant change. Mild stable cardiomegaly. Remainder of the exam is unchanged. IMPRESSION: Evidence of mild vascular congestion. Small amount left pleural fluid/basilar atelectasis unchanged. Electronically Signed   By: Elberta Fortis M.D.   On: 06/22/2018 19:09    Procedures Procedures (including critical care time)  Medications Ordered in ED Medications - No data to display   Initial Impression / Assessment and Plan / ED Course  I have reviewed the triage vital signs and the nursing notes.  Pertinent labs & imaging results that were available during my care of the patient were reviewed by me and considered in my medical decision making (see chart for details).     MDM  Screen complete  Patient is presenting for evaluation of reported shortness of breath.  Episodes of shortness of breath occurred while patient is asleep.  Patient denies any symptoms while he is awake.  Patient with extensive recent history.  The patient may be suffering from an element of sleep apnea -which he has not been evaluated for previously.  Screening labs in the ED are without significant abnormality.  Patient feels asymptomatic during his ED evaluation.  He desires discharge home.  Patient was ambulated around the ED without hypoxia prior to discharge home.  Strict return precautions given and understood.  Importance of close follow-up stressed.  Final Clinical Impressions(s) / ED Diagnoses   Final diagnoses:  SOB (shortness of breath)    ED Discharge Orders    None       Wynetta Fines, MD 06/23/18 0034    Wynetta Fines, MD 06/23/18 [phone]

## 2018-06-23 NOTE — Discharge Instructions (Signed)
Please return for any problem. Follow up with your regular physician in 2 days as already scheduled.

## 2018-06-24 ENCOUNTER — Ambulatory Visit: Payer: Self-pay | Admitting: Physician Assistant

## 2018-06-27 ENCOUNTER — Ambulatory Visit: Payer: Self-pay | Admitting: Physician Assistant

## 2018-06-27 ENCOUNTER — Encounter: Payer: Self-pay | Admitting: Physician Assistant

## 2018-06-27 VITALS — BP 111/68 | HR 64 | Temp 97.9°F | Resp 17 | Ht 68.0 in | Wt 200.0 lb

## 2018-06-27 DIAGNOSIS — E1165 Type 2 diabetes mellitus with hyperglycemia: Secondary | ICD-10-CM

## 2018-06-27 MED ORDER — METFORMIN HCL 1000 MG PO TABS: ORAL_TABLET | ORAL | 1 refills | Status: DC

## 2018-06-27 MED ORDER — GLIPIZIDE 5 MG PO TABS: 5.0000 mg | ORAL_TABLET | Freq: Every day | ORAL | 1 refills | Status: DC

## 2018-06-27 MED FILL — glipiZIDE 5 MG TABS: 5 | 30 days supply | Qty: 30 | Fill #0

## 2018-06-27 MED FILL — metFORMIN HCL 1000 MG TABS: 1000 | 30 days supply | Qty: 60 | Fill #0

## 2018-06-27 NOTE — Progress Notes (Signed)
06/28/2018 9:34 AM   DOB: [birth year] / MRN: [id number]  SUBJECTIVE:  Robert Sanchez is a 62 y.o. male presenting for diabetes medication refills. 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  Fortunately he has stopped smoking five weeks ago and tells me "I am now on board with taking my meds because I'm not ready to die yet."  He has his first hospital follow up with [facility] on the 29th and plans to keep that appointment.  He has Cardiology and Vascular follow ups in place and plans to keep those appointments.  He is taking he medications exactly as they are prescribed. He tells me "I feel great today."  He is allergic to lisinopril and codeine.   He  has a past medical history of Acute pulmonary edema (HCC) (06/05/2018), Arthritis, Diabetes mellitus without complication (HCC), and Diabetic neuropathy (HCC).    He  reports that he quit smoking about 5 weeks ago. He has a 40.00 pack-year smoking history. He has never used smokeless tobacco. He reports that he drank alcohol. He reports that he does not use drugs. He  has no sexual activity history on file. The patient  has a past surgical history that includes Spine surgery; Knee arthroscopy; RIGHT/LEFT HEART CATH AND CORONARY ANGIOGRAPHY (N/A, 06/05/2018); Coronary artery bypass graft (N/A, 06/12/2018); TEE without cardioversion (N/A, 06/12/2018); ABDOMINAL AORTOGRAM W/LOWER EXTREMITY (N/A, 06/21/2018); and PERIPHERAL VASCULAR INTERVENTION (Left, 06/21/2018).  His family history includes Cancer in his father; Diabetes in his mother; Heart disease in his father.  Review of Systems  Constitutional: Negative for chills, diaphoresis and fever.  Eyes: Negative.   Respiratory: Negative for cough, hemoptysis, sputum production, shortness of breath and wheezing.   Cardiovascular: Negative for chest pain, orthopnea  and leg swelling.  Gastrointestinal: Negative for abdominal pain, blood in stool, constipation, diarrhea, heartburn, melena, nausea and vomiting.  Genitourinary: Negative for dysuria, flank pain, frequency, hematuria and urgency.  Skin: Negative for rash.  Neurological: Negative for dizziness, sensory change, speech change, focal weakness and headaches.    The problem list and medications were reviewed and updated by myself where necessary and exist elsewhere in the encounter.   OBJECTIVE:  BP 111/68   Pulse 64   Temp 97.9 F (36.6 C) (Oral)   Resp 17   Ht 5\' 8"  (1.727 m)   Wt 200 lb (90.7 kg)   SpO2 98%   BMI 30.41 kg/m   Wt Readings from Last 3 Encounters:  06/27/18 200 lb (90.7 kg)  06/22/18 202 lb (91.6 kg)  06/21/18 202 lb (91.6 kg)   Temp Readings from Last 3 Encounters:  06/27/18 97.9 F (36.6 C) (Oral)  06/22/18 98.3 F (36.8 C) (Oral)  06/21/18 (!) 97.2 F (36.2 C) (Oral)   BP Readings from Last 3 Encounters:  06/27/18 111/68  06/23/18 (!) 106/56  06/21/18 106/64   Pulse Readings from Last 3 Encounters:  06/27/18 64  06/23/18 72  06/21/18 71    Physical Exam  Constitutional: He is oriented to person, place, and time. He appears well-developed. He does not appear ill.  Eyes: Pupils are equal, round, and reactive to light. Conjunctivae and EOM are normal.  Cardiovascular: Normal rate, regular rhythm, S1 normal, S2 normal, normal heart sounds, intact distal pulses and normal pulses. Exam reveals no gallop and no friction rub.  No murmur heard. Pulmonary/Chest:  Effort normal. No stridor. No respiratory distress. He has no wheezes. He has no rales.  Abdominal: He exhibits no distension.  Musculoskeletal: Normal range of motion. He exhibits no edema.       Legs: Neurological: He is alert and oriented to person, place, and time. He has normal strength and normal reflexes. He is not disoriented. No cranial nerve deficit or sensory deficit. He exhibits normal  muscle tone. Coordination and gait normal.  Skin: Skin is warm and dry. He is not diaphoretic.  Psychiatric: He has a normal mood and affect. His behavior is normal.  Nursing note and vitals reviewed.   Lab Results  Component Value Date   HGBA1C 12.4 (H) 06/12/2018    Lab Results  Component Value Date   WBC 12.5 (H) 06/22/2018   HGB 10.7 (L) 06/22/2018   HCT 34.3 (L) 06/22/2018   MCV 93.7 06/22/2018   PLT 618 (H) 06/22/2018    Lab Results  Component Value Date   CREATININE 1.69 (H) 06/22/2018   BUN 27 (H) 06/22/2018   NA 136 06/22/2018   K 4.2 06/22/2018   CL 98 06/22/2018   CO2 28 06/22/2018   Estimated Creatinine Clearance: 50.8 mL/min (A) (by C-G formula based on SCr of 1.69 mg/dL (H)).   Lab Results  Component Value Date   ALT 33 06/22/2018   AST 18 06/22/2018   ALKPHOS 70 06/22/2018   BILITOT 0.6 06/22/2018    Lab Results  Component Value Date   TSH 3.719 06/10/2018    Lab Results  Component Value Date   CHOL 148 06/10/2018   HDL 24 (L) 06/10/2018   LDLCALC 100 (H) 06/10/2018   TRIG 119 06/10/2018   CHOLHDL 6.2 06/10/2018     ASSESSMENT AND PLAN:  [name] was seen today for medication refill.  Diagnoses and all orders for this visit:  Uncontrolled type 2 diabetes mellitus with hyperglycemia [facility](HCC): He plans to see [facility] in two days. He plans to keep his follow ups with cards and vascular. He has been uncontrolled from a diabetes standpoint for a long time.  I am easing him back onto metformin.  I am adding a low dose sulfonurea qam with food.   -     Cancel: POCT glycosylated hemoglobin (Hb A1C) -     glipiZIDE (GLUCOTROL) 5 MG tablet; Take 1 tablet (5 mg total) by mouth daily before breakfast. -     metFORMIN (GLUCOPHAGE) 1000 MG tablet; Take 1/2 tab in the morning and night for 1 week then increase to 1 tabs in the morning and night.  Other orders -     Discontinue: metFORMIN (GLUCOPHAGE) 1000 MG tablet; Take 1/2 tab in the morning and  night for 1 week then increase to 1 tabs in the morning and night.    The patient is advised to call or return to clinic if he does not see an improvement in symptoms, or to seek the care of the closest emergency department if he worsens with the above plan.   Deliah BostonMichael Walther Sanagustin, MHS, PA-C [facility] 06/28/2018 9:34 AM

## 2018-06-27 NOTE — Patient Instructions (Signed)
° ° ° °  If you have lab work done today you will be contacted with your lab results within the next 2 weeks.  If you have not heard from us then please contact us. The fastest way to get your results is to register for My Chart. ° ° °IF you received an x-ray today, you will receive an invoice from [facility]. Please contact [facility] at [phone] with questions or concerns regarding your invoice.  ° °IF you received labwork today, you will receive an invoice from [facility]. Please contact [facility] at [phone] with questions or concerns regarding your invoice.  ° °Our billing staff will not be able to assist you with questions regarding bills from these companies. ° °You will be contacted with the lab results as soon as they are available. The fastest way to get your results is to activate your My Chart account. Instructions are located on the last page of this paperwork. If you have not heard from us regarding the results in 2 weeks, please contact this office. °  ° ° ° °

## 2018-06-28 NOTE — Progress Notes (Deleted)
Patient ID: Robert Sanchez, male   DOB: 11-14-1955, 62 y.o.   MRN: [id number]  After being seen in ED 06/22/2018 for SOB while sleeping.  From A/P: Patient is presenting for evaluation of reported shortness of breath.  Episodes of shortness of breath occurred while patient is asleep.  Patient denies any symptoms while he is awake.  Patient with extensive recent history.  The patient may be suffering from an element of sleep apnea -which he has not been evaluated for previously.  Screening labs in the ED are without significant abnormality.  Patient feels asymptomatic during his ED evaluation.  He desires discharge home.  Patient was ambulated around the ED without hypoxia prior to discharge home.  Strict return precautions given and understood.  Importance of close follow-up stressed.

## 2018-06-29 ENCOUNTER — Inpatient Hospital Stay: Payer: Self-pay

## 2018-07-18 ENCOUNTER — Encounter: Payer: Self-pay | Admitting: Vascular Surgery

## 2018-07-18 ENCOUNTER — Other Ambulatory Visit: Payer: Self-pay | Admitting: *Deleted

## 2018-07-18 ENCOUNTER — Other Ambulatory Visit: Payer: Self-pay

## 2018-07-18 ENCOUNTER — Ambulatory Visit (HOSPITAL_COMMUNITY)
Admission: RE | Admit: 2018-07-18 | Discharge: 2018-07-18 | Disposition: A | Discharge status: Home / Self Care | Payer: Self-pay | Source: Ambulatory Visit | Attending: Vascular Surgery | Admitting: Vascular Surgery

## 2018-07-18 ENCOUNTER — Ambulatory Visit (INDEPENDENT_AMBULATORY_CARE_PROVIDER_SITE_OTHER): Payer: Self-pay | Admitting: Vascular Surgery

## 2018-07-18 VITALS — BP 126/77 | HR 77 | Temp 97.4°F | Resp 20 | Ht 68.0 in | Wt 192.4 lb

## 2018-07-18 DIAGNOSIS — R200 Anesthesia of skin: Secondary | ICD-10-CM | POA: Insufficient documentation

## 2018-07-18 DIAGNOSIS — I70229 Atherosclerosis of native arteries of extremities with rest pain, unspecified extremity: Secondary | ICD-10-CM

## 2018-07-18 DIAGNOSIS — I998 Other disorder of circulatory system: Secondary | ICD-10-CM

## 2018-07-18 DIAGNOSIS — Z87891 Personal history of nicotine dependence: Secondary | ICD-10-CM | POA: Insufficient documentation

## 2018-07-18 DIAGNOSIS — I2510 Atherosclerotic heart disease of native coronary artery without angina pectoris: Secondary | ICD-10-CM | POA: Insufficient documentation

## 2018-07-18 DIAGNOSIS — I739 Peripheral vascular disease, unspecified: Secondary | ICD-10-CM

## 2018-07-18 NOTE — Progress Notes (Signed)
Patient name: Robert Sanchez MRN: [id number] DOB: [birth year] Sex: male  REASON FOR VISIT: Follow-up after left external iliac stent.  HPI: Robert Sanchez is a 62 y.o. male with multiple medical comorbidities that presents for hospital follow-up after left external iliac stent for rest pain.  Patient had a CABG earlier in August and was seen as consultation in the hospital for rest pain in the left foot with an ABI 0.3.  He was discharged home and presented as an outpatient for his arteriogram.  He had multilevel disease and we are able to stent his external iliac with self expanding stent.  Unfortunately he still had a long SFA occlusion as well as severe tibial disease with single-vessel runoff.  On evaluation today he continues to complain of rest pain in his left foot.  He states the symptoms were mildly improved initially but are back to baseline.  He did have ABIs today that show he has an ABI 0.4 in the left foot.  Past Medical History:  Diagnosis Date  . Acute pulmonary edema (HCC) 06/05/2018  . Arthritis   . Diabetes mellitus without complication (HCC)   . Diabetic neuropathy [facility])     Past Surgical History:  Procedure Laterality Date  . ABDOMINAL AORTOGRAM W/LOWER EXTREMITY N/A 06/21/2018   Procedure: ABDOMINAL AORTOGRAM W/LOWER EXTREMITY;  Surgeon: Cephus Shelling, MD;  Location: [facility] INVASIVE CV LAB;  Service: Cardiovascular;  Laterality: N/A;  . CORONARY ARTERY BYPASS GRAFT N/A 06/12/2018   Procedure: CORONARY ARTERY BYPASS GRAFTING (CABG) x 3 WITH ENDOSCOPIC HARVESTING OF RIGHT GREATER SAPHENOUS VEIN. LIMA TO LAD. SVG TO PD. SVG TO DIAGONAL.;  Surgeon: Kerin Perna, MD;  Location: [facility] OR;  Service: Open Heart Surgery;  Laterality: N/A;  . KNEE ARTHROSCOPY    . PERIPHERAL VASCULAR INTERVENTION Left 06/21/2018   Procedure: PERIPHERAL VASCULAR INTERVENTION;  Surgeon: Cephus Shelling, MD;  Location[facility] INVASIVE CV LAB;  Service: Cardiovascular;  Laterality: Left;  .  RIGHT/LEFT HEART CATH AND CORONARY ANGIOGRAPHY N/A 06/05/2018   Procedure: RIGHT/LEFT HEART CATH AND CORONARY ANGIOGRAPHY;  Surgeon: Orpah Cobb, MD;  Location: [facility] INVASIVE CV LAB;  Service: Cardiovascular;  Laterality: N/A;  . SPINE SURGERY    . TEE WITHOUT CARDIOVERSION N/A 06/12/2018   Procedure: TRANSESOPHAGEAL ECHOCARDIOGRAM (TEE);  Surgeon: Donata Clay, Theron Arista, MD;  Location: [facility] OR;  Service: Open Heart Surgery;  Laterality: N/A;    Family History  Problem Relation Age of Onset  . Diabetes Mother   . Cancer Father   . Heart disease Father     SOCIAL HISTORY: Social History   Tobacco Use  . Smoking status: Former Smoker    Packs/day: 1.00    Years: 40.00    Pack years: 40.00    Last attempt to quit: 05/23/2018    Years since quitting: 0.1  . Smokeless tobacco: Never Used  Substance Use Topics  . Alcohol use: Not Currently    Allergies  Allergen Reactions  . Lisinopril Other (See Comments)    Syncope  . Codeine Rash    Current Outpatient Medications  Medication Sig Dispense Refill  . amiodarone (PACERONE) 400 MG tablet Take 1 tablet (400 mg total) by mouth 2 (two) times daily. For 7 days, then decrease to 400 mg daily 60 tablet 1  . aspirin EC 325 MG EC tablet Take 1 tablet (325 mg total) by mouth daily. 30 tablet 0  . atorvastatin (LIPITOR) 40 MG tablet Take 1 tablet (40 mg total) by mouth daily  at 6 PM. 30 tablet 3  . carvedilol (COREG) 12.5 MG tablet Take 1 tablet (12.5 mg total) by mouth 2 (two) times daily with a meal. 60 tablet 3  . clopidogrel (PLAVIX) 75 MG tablet Take 1 tablet (75 mg total) by mouth daily. 30 tablet 11  . furosemide (LASIX) 40 MG tablet Take 1 tablet (40 mg total) by mouth daily. 30 tablet 1  . glipiZIDE (GLUCOTROL) 5 MG tablet Take 1 tablet (5 mg total) by mouth daily before breakfast. 30 tablet 1  . metFORMIN (GLUCOPHAGE) 1000 MG tablet Take 1/2 tab in the morning and night for 1 week then increase to 1 tabs in the morning and night. 60 tablet 1   . potassium chloride SA (K-DUR,KLOR-CON) 20 MEQ tablet Take 1 tablet (20 mEq total) by mouth daily. 30 tablet 1  . traMADol (ULTRAM) 50 MG tablet Take 1 tablet (50 mg total) by mouth every 4 (four) hours as needed for moderate pain. 30 tablet 0   No current facility-administered medications for this visit.     REVIEW OF SYSTEMS:  [X]  denotes positive finding, [ ]  denotes negative finding Cardiac  Comments:  Chest pain or chest pressure:    Shortness of breath upon exertion:    Short of breath when lying flat:    Irregular heart rhythm:        Vascular    Pain in calf, thigh, or hip brought on by ambulation: x   Pain in feet at night that wakes you up from your sleep:  x   Blood clot in your veins:    Leg swelling:         Pulmonary    Oxygen at home:    Productive cough:     Wheezing:         Neurologic    Sudden weakness in arms or legs:     Sudden numbness in arms or legs:     Sudden onset of difficulty speaking or slurred speech:    Temporary loss of vision in one eye:     Problems with dizziness:         Gastrointestinal    Blood in stool:     Vomited blood:         Genitourinary    Burning when urinating:     Blood in urine:        Psychiatric    Major depression:         Hematologic    Bleeding problems:    Problems with blood clotting too easily:        Skin    Rashes or ulcers:        Constitutional    Fever or chills:      PHYSICAL EXAM: Vitals:   07/18/18 1557  BP: 126/77  Pulse: 77  Resp: 20  Temp: (!) 97.4 F (36.3 C)  TempSrc: Oral  SpO2: 99%  Weight: 192 lb 6.4 oz (87.3 kg)  Height: 5\' 8"  (1.727 m)    GENERAL: The patient is a well-nourished male, in no acute distress. The vital signs are documented above. CARDIAC: There is a regular rate and rhythm.  VASCULAR:  Bilateral groins with no evidence of hematoma.  2+ femoral pulse bilateral groins. Left foot with no palpable pedal pulses.  He has monophasic dorsalis pedis and  posterior tibial signals. PULMONARY: There is good air exchange bilaterally without wheezing or rales. MUSCULOSKELETAL: There are no major deformities or cyanosis. NEUROLOGIC: No focal weakness  or paresthesias are detected. SKIN: There are no ulcers or rashes noted.   DATA:   I independently reviewed his noninvasive imaging that shows a ABI of 0.43 in the left foot now compared to 0.63 in the right foot.  Assessment/Plan:  62 year old male approximately one month status post CABG for severe coronary disease that presents with ongoing rest pain in his left foot.  Unfortunately did not appear to have significant improvement after we stented his left external iliac stenosis.  He has a long SFA occlusion as well as severe tibial disease and would need a left femoral to posterior tibial bypass.  We need to arrange vein mapping in order to see what options he has for conduit given the need for a below-knee bypass.  Moreover we need to make sure he is cleared with cardiology and CT surgery given that he is only about a month out from his open heart surgery to see when they feel he safe to proceed back to the OR. Long discussion with Mr. Gilman ButtnerMcCarty regarding the fact that if he has no usable vein we may have limited options using either prosthetic graft or cryo-vein which is not durable below the knee.  I get the impression that he feels the symptoms are not tolerable in his left foot and that he needs to have something done.  We will see his options when his vein mapping is done.   Cephus Shellinghristopher J. Meaghen Vecchiarelli, MD [facility] [place] Office: [phone] Pager: [phone]   Cephus Shellinghristopher J Sible Straley

## 2018-07-21 ENCOUNTER — Other Ambulatory Visit: Payer: Self-pay | Admitting: Cardiothoracic Surgery

## 2018-07-21 DIAGNOSIS — Z951 Presence of aortocoronary bypass graft: Secondary | ICD-10-CM

## 2018-07-21 MED FILL — FUROSEMIDE 40 MG TAB: 40 | 30 days supply | Qty: 30 | Fill #1

## 2018-07-21 MED FILL — CLOPIDOGREL 75 MG TABLET: 75 | 30 days supply | Qty: 30 | Fill #1

## 2018-07-21 MED FILL — POTASSIUM CL ER 20 MEQ TABL: 20 | 30 days supply | Qty: 30 | Fill #1

## 2018-07-24 ENCOUNTER — Ambulatory Visit (INDEPENDENT_AMBULATORY_CARE_PROVIDER_SITE_OTHER): Payer: Self-pay | Admitting: Surgical

## 2018-07-24 ENCOUNTER — Ambulatory Visit
Admission: RE | Admit: 2018-07-24 | Discharge: 2018-07-24 | Disposition: A | Discharge status: Home / Self Care | Payer: Self-pay | Source: Ambulatory Visit | Attending: Cardiothoracic Surgery | Admitting: Cardiothoracic Surgery

## 2018-07-24 VITALS — BP 119/72 | HR 78 | Resp 20 | Ht 68.0 in | Wt 197.0 lb

## 2018-07-24 DIAGNOSIS — Z951 Presence of aortocoronary bypass graft: Secondary | ICD-10-CM

## 2018-07-24 DIAGNOSIS — I2510 Atherosclerotic heart disease of native coronary artery without angina pectoris: Secondary | ICD-10-CM

## 2018-07-24 MED FILL — ATORVASTATIN 40 MG TABLET: 40 | 30 days supply | Qty: 30 | Fill #1

## 2018-07-24 NOTE — Patient Instructions (Signed)
Discussed activity restriction.

## 2018-07-24 NOTE — Progress Notes (Signed)
[address]       [place] [address]             [phone]           [address]       [place] [address]             [phone]      TYMERE DEPUY [facility] Medical Record #[id number] Date of Birth: 1956/08/03  Referring: Orpah Cobb, MD Primary Care: Patient, No Pcp Per Primary Cardiologist: No primary care provider on file.   Chief Complaint:   POST OP FOLLOW UP OPERATIVE REPORT  DATE OF PROCEDURE:  06/12/2018  OPERATION:   1.  Coronary artery bypass grafting x3 (left internal mammary artery to left anterior descending, saphenous vein graft to diagonal, saphenous vein graft to posterolateral branch of the right coronary. 2.  Endoscopic harvest of right leg greater saphenous vein.  SURGEON:  Kerin Perna, MD  ASSISTANT:  Jari Favre, PA-C.  ANESTHESIA:  General by Dr. Bradley Ferris.    History of Present Illness:    The patient is a 62 year old male status post the above described procedure seen in the office on today's date and routine postsurgical follow-up.  He reports that he is doing well.  Longer taking any pain medications and only has mild soreness.  He denies fevers, chills or other significant constitutional symptoms.  He denies shortness of breath or palpitations.  He denies peripheral edema.  Ambulation is limited by left foot ischemia and vascular surgery plans a femoral-tibial bypass due to severe SFA disease.  He does not have any lower extremity infection but does have some minor blistering of the soles of his foot.  Primary symptom is significantly limiting claudication affecting lifestyle.  Overall he is very pleased with his postsurgical recovery following his CABG.      Past Medical History:  Diagnosis Date  . Acute pulmonary edema (HCC) 06/05/2018  . Arthritis   . Diabetes mellitus without complication (HCC)   . Diabetic neuropathy (HCC)      Social History   Tobacco Use  Smoking  Status Former Smoker  . Packs/day: 1.00  . Years: 40.00  . Pack years: 40.00  . Last attempt to quit: 05/23/2018  . Years since quitting: 0.1  Smokeless Tobacco Never Used    Social History   Substance and Sexual Activity  Alcohol Use Not Currently     Allergies  Allergen Reactions  . Lisinopril Other (See Comments)    Syncope  . Codeine Rash    Current Outpatient Medications  Medication Sig Dispense Refill  . amiodarone (PACERONE) 400 MG tablet Take 1 tablet (400 mg total) by mouth 2 (two) times daily. For 7 days, then decrease to 400 mg daily 60 tablet 1  . aspirin EC 325 MG EC tablet Take 1 tablet (325 mg total) by mouth daily. 30 tablet 0  . atorvastatin (LIPITOR) 40 MG tablet Take 1 tablet (40 mg total) by mouth daily at 6 PM. 30 tablet 3  . carvedilol (COREG) 12.5 MG tablet Take 1 tablet (12.5 mg total) by mouth 2 (two) times daily with a meal. 60 tablet 3  . clopidogrel (PLAVIX) 75 MG tablet Take 1 tablet (75 mg total) by mouth daily. 30 tablet 11  . furosemide (LASIX) 40 MG tablet Take 1 tablet (40 mg total) by mouth daily. 30 tablet 1  . glipiZIDE (GLUCOTROL) 5 MG tablet Take 1 tablet (  5 mg total) by mouth daily before breakfast. 30 tablet 1  . metFORMIN (GLUCOPHAGE) 1000 MG tablet Take 1/2 tab in the morning and night for 1 week then increase to 1 tabs in the morning and night. 60 tablet 1  . potassium chloride SA (K-DUR,KLOR-CON) 20 MEQ tablet Take 1 tablet (20 mEq total) by mouth daily. 30 tablet 1  . traMADol (ULTRAM) 50 MG tablet Take 1 tablet (50 mg total) by mouth every 4 (four) hours as needed for moderate pain. 30 tablet 0   No current facility-administered medications for this visit.        Physical Exam: Ht 5\' 8"  (1.727 m)   BMI 29.25 kg/m   General appearance: alert, cooperative and no distress Heart: regular rate and rhythm Lungs: clear to auscultation bilaterally Abdomen: Benign exam Extremities: No edema Wound: Surgical incisions well-healed  without evidence of infection.  He does have some left foot blistering on the dorsum of his forefoot.  There is no active cellulitis or purulence.   Diagnostic Studies & Laboratory data:     Recent Radiology Findings:   Dg Chest 2 View  Result Date: 07/24/2018 CLINICAL DATA:  Status post coronary bypass graft. EXAM: CHEST - 2 VIEW COMPARISON:  Radiographs of June 22, 2018. FINDINGS: The heart size and mediastinal contours are within normal limits. Sternotomy wires are noted. No pneumothorax or pleural effusion is noted. Both lungs are clear. The visualized skeletal structures are unremarkable. IMPRESSION: No active cardiopulmonary disease. Electronically Signed   By: Lupita RaiderJames  Green Jr, M.D.   On: 07/24/2018 13:59      Recent Lab Findings: Lab Results  Component Value Date   WBC 12.5 (H) 06/22/2018   HGB 10.7 (L) 06/22/2018   HCT 34.3 (L) 06/22/2018   PLT 618 (H) 06/22/2018   GLUCOSE 263 (H) 06/22/2018   CHOL 148 06/10/2018   TRIG 119 06/10/2018   HDL 24 (L) 06/10/2018   LDLCALC 100 (H) 06/10/2018   ALT 33 06/22/2018   AST 18 06/22/2018   NA 136 06/22/2018   K 4.2 06/22/2018   CL 98 06/22/2018   CREATININE 1.69 (H) 06/22/2018   BUN 27 (H) 06/22/2018   CO2 28 06/22/2018   TSH 3.719 06/10/2018   INR 1.45 06/12/2018   HGBA1C 12.4 (H) 06/12/2018      Assessment / Plan: The patient is now 6-week status post CABG with excellent surgical recovery.  He has significant lifestyle limiting claudication of the left lower extremity and has been seen by vascular surgery for consideration of peripheral bypass.  From a surgical perspective following his CABG they can proceed at any time.  The patient appears anxious to proceed as soon as is feasible.  It is noted the patient is on Plavix so that will need to be considered at time of bypass if he needs a washout period.  He has not smoked cigarettes and at least 7 weeks and is committed to poking cessation.  His diabetes was very poorly  controlled previously but is improved and he is now on Glucophage and glyburide.  We discussed lifestyle and nutrition management for diabetes including significantly lowering his carbohydrate/glucose intake and he is committed to this long-term as well.  He has lost about 20 to 25 pounds since surgery.  We will see him again in the office in 2 months for routine follow-up.  He will also follow-up with cardiology, Dr. Algie CofferKadakia, in December.  His amiodarone was decreased at last cardiology appointment to 400 mg  daily.  I have not made any changes to other current medication regimen.          Rowe Clack, PA-C 07/24/2018 2:06 PM Pager [phone]

## 2018-07-25 ENCOUNTER — Ambulatory Visit (HOSPITAL_COMMUNITY)
Admission: RE | Admit: 2018-07-25 | Discharge: 2018-07-25 | Disposition: A | Discharge status: Home / Self Care | Payer: Self-pay | Source: Ambulatory Visit | Attending: Vascular Surgery | Admitting: Vascular Surgery

## 2018-07-25 ENCOUNTER — Encounter: Payer: Self-pay | Admitting: *Deleted

## 2018-07-25 ENCOUNTER — Other Ambulatory Visit: Payer: Self-pay

## 2018-07-25 ENCOUNTER — Ambulatory Visit (INDEPENDENT_AMBULATORY_CARE_PROVIDER_SITE_OTHER): Payer: Self-pay | Admitting: Vascular Surgery

## 2018-07-25 ENCOUNTER — Encounter: Payer: Self-pay | Admitting: Vascular Surgery

## 2018-07-25 VITALS — BP 156/83 | HR 72 | Temp 97.6°F | Resp 20 | Ht 68.0 in | Wt 198.0 lb

## 2018-07-25 DIAGNOSIS — E119 Type 2 diabetes mellitus without complications: Secondary | ICD-10-CM | POA: Insufficient documentation

## 2018-07-25 DIAGNOSIS — I998 Other disorder of circulatory system: Secondary | ICD-10-CM | POA: Insufficient documentation

## 2018-07-25 DIAGNOSIS — I70229 Atherosclerosis of native arteries of extremities with rest pain, unspecified extremity: Secondary | ICD-10-CM

## 2018-07-25 DIAGNOSIS — Z01818 Encounter for other preprocedural examination: Secondary | ICD-10-CM | POA: Insufficient documentation

## 2018-07-25 DIAGNOSIS — Z87891 Personal history of nicotine dependence: Secondary | ICD-10-CM | POA: Insufficient documentation

## 2018-07-25 DIAGNOSIS — I739 Peripheral vascular disease, unspecified: Secondary | ICD-10-CM

## 2018-07-25 NOTE — Progress Notes (Addendum)
Patient name: Robert Sanchez MRN: [id number] DOB: December 11, 1955 Sex: male  REASON FOR VISIT: Follow-up to discuss left fem distal bypass  HPI: Robert Sanchez is a 62 y.o. male with multiple medical comorbidities that presents for hospital follow-up after left external iliac stent for rest pain.  Patient had a CABG earlier in August and was seen as consultation in the hospital for rest pain in the left foot with an ABI 0.3.  He was discharged home and presented as an outpatient for his arteriogram.  He had multilevel disease and we were able to stent his external iliac with self expanding stent with minimal improvement.  Unfortunately he still had a long SFA occlusion as well as severe tibial disease with single-vessel PT runoff.  He states he cannot live with his symptoms.  He is ready to proceed with a bypass and understands the risks and benefits.  Past Medical History:  Diagnosis Date  . Acute pulmonary edema (HCC) 06/05/2018  . Arthritis   . Diabetes mellitus without complication (HCC)   . Diabetic neuropathy [facility](HCC)     Past Surgical History:  Procedure Laterality Date  . ABDOMINAL AORTOGRAM W/LOWER EXTREMITY N/A 06/21/2018   Procedure: ABDOMINAL AORTOGRAM W/LOWER EXTREMITY;  Surgeon: Cephus Shellinglark, Kentrel Clevenger J, MD;  Location: [facility] INVASIVE CV LAB;  Service: Cardiovascular;  Laterality: N/A;  . CORONARY ARTERY BYPASS GRAFT N/A 06/12/2018   Procedure: CORONARY ARTERY BYPASS GRAFTING (CABG) x 3 WITH ENDOSCOPIC HARVESTING OF RIGHT GREATER SAPHENOUS VEIN. LIMA TO LAD. SVG TO PD. SVG TO DIAGONAL.;  Surgeon: Kerin PernaVan Trigt, Peter, MD;  [facility];  Service: Open Heart Surgery;  Laterality: N/A;  . KNEE ARTHROSCOPY    . PERIPHERAL VASCULAR INTERVENTION Left 06/21/2018   Procedure: PERIPHERAL VASCULAR INTERVENTION;  Surgeon: Cephus Shellinglark, Marda Breidenbach J, MD;  [facility];  Service: Cardiovascular;  Laterality: Left;  . RIGHT/LEFT HEART CATH AND CORONARY ANGIOGRAPHY N/A 06/05/2018   Procedure:  RIGHT/LEFT HEART CATH AND CORONARY ANGIOGRAPHY;  Surgeon: Orpah CobbKadakia, Ajay, MD;  Location: [facility] INVASIVE CV LAB;  Service: Cardiovascular;  Laterality: N/A;  . SPINE SURGERY    . TEE WITHOUT CARDIOVERSION N/A 06/12/2018   Procedure: TRANSESOPHAGEAL ECHOCARDIOGRAM (TEE);  Surgeon: Donata ClayVan Trigt, Theron AristaPeter, MD;  [facility];  Service: Open Heart Surgery;  Laterality: N/A;    Family History  Problem Relation Age of Onset  . Diabetes Mother   . Cancer Father   . Heart disease Father     SOCIAL HISTORY: Social History   Tobacco Use  . Smoking status: Former Smoker    Packs/day: 1.00    Years: 40.00    Pack years: 40.00    Last attempt to quit: 05/23/2018    Years since quitting: 0.1  . Smokeless tobacco: Never Used  Substance Use Topics  . Alcohol use: Not Currently    Allergies  Allergen Reactions  . Lisinopril Other (See Comments)    Syncope  . Codeine Rash    Current Outpatient Medications  Medication Sig Dispense Refill  . amiodarone (PACERONE) 400 MG tablet Take 1 tablet (400 mg total) by mouth 2 (two) times daily. For 7 days, then decrease to 400 mg daily (Patient taking differently: Take 400 mg by mouth daily. For 7 days, then decrease to 400 mg daily) 60 tablet 1  . aspirin EC 325 MG EC tablet Take 1 tablet (325 mg total) by mouth daily. 30 tablet 0  . atorvastatin (LIPITOR) 40 MG tablet Take 1 tablet (40 mg total) by mouth  daily at 6 PM. 30 tablet 3  . carvedilol (COREG) 12.5 MG tablet Take 1 tablet (12.5 mg total) by mouth 2 (two) times daily with a meal. 60 tablet 3  . clopidogrel (PLAVIX) 75 MG tablet Take 1 tablet (75 mg total) by mouth daily. 30 tablet 11  . furosemide (LASIX) 40 MG tablet Take 1 tablet (40 mg total) by mouth daily. 30 tablet 1  . glipiZIDE (GLUCOTROL) 5 MG tablet Take 1 tablet (5 mg total) by mouth daily before breakfast. 30 tablet 1  . metFORMIN (GLUCOPHAGE) 1000 MG tablet Take 1/2 tab in the morning and night for 1 week then increase to 1 tabs in the  morning and night. 60 tablet 1  . potassium chloride SA (K-DUR,KLOR-CON) 20 MEQ tablet Take 1 tablet (20 mEq total) by mouth daily. 30 tablet 1   No current facility-administered medications for this visit.     REVIEW OF SYSTEMS:  [X]  denotes positive finding, [ ]  denotes negative finding Cardiac  Comments:  Chest pain or chest pressure:    Shortness of breath upon exertion:    Short of breath when lying flat:    Irregular heart rhythm:        Vascular    Pain in calf, thigh, or hip brought on by ambulation: x   Pain in feet at night that wakes you up from your sleep:  x   Blood clot in your veins:    Leg swelling:         Pulmonary    Oxygen at home:    Productive cough:     Wheezing:         Neurologic    Sudden weakness in arms or legs:     Sudden numbness in arms or legs:     Sudden onset of difficulty speaking or slurred speech:    Temporary loss of vision in one eye:     Problems with dizziness:         Gastrointestinal    Blood in stool:     Vomited blood:         Genitourinary    Burning when urinating:     Blood in urine:        Psychiatric    Major depression:         Hematologic    Bleeding problems:    Problems with blood clotting too easily:        Skin    Rashes or ulcers:        Constitutional    Fever or chills:      PHYSICAL EXAM: Vitals:   07/25/18 1457  BP: (!) 156/83  Pulse: 72  Resp: 20  Temp: 97.6 F (36.4 C)  TempSrc: Oral  SpO2: 98%  Weight: 89.8 kg  Height: 5\' 8"  (1.727 m)    GENERAL: The patient is a well-nourished male, in no acute distress. The vital signs are documented above. CARDIAC: There is a regular rate and rhythm.  VASCULAR:  2+ femoral pulse bilateral groins. Left foot with no palpable pedal pulses.  He has monophasic dorsalis pedis and posterior tibial signals in the left foot. No tissue loss in left foot. PULMONARY: There is good air exchange bilaterally without wheezing or rales. MUSCULOSKELETAL: There  are no major deformities or cyanosis. NEUROLOGIC: No focal weakness or paresthesias are detected.    DATA:   I independently reviewed his noninvasive imaging that shows a ABI of 0.43 in the left foot now compared  to 0.63 in the right foot.  I independently reviewed his vein mapping which shows a good saphenous vein in the left leg that 2.6 mm in smallest diameter.  Assessment/Plan:  He has now been cleared by CT surgery following his CABG to proceed with lower extremity bypass.  Fortunately on vein mapping today he has a nice long saphenous vein in his left leg.  I recommended a left common femoral artery to posterior tibial bypass for critical limb ischemia with rest pain.  We will schedule for next available date.  He will need to hold his Plavix 5 days before the bypass.  Cephus Shelling, MD [facility] [place] Office: [phone] Pager: [phone]   Cephus Shelling

## 2018-08-11 ENCOUNTER — Encounter: Payer: Self-pay | Admitting: Family Medicine

## 2018-08-11 ENCOUNTER — Other Ambulatory Visit: Payer: Self-pay

## 2018-08-11 ENCOUNTER — Ambulatory Visit: Payer: Self-pay | Admitting: Family Medicine

## 2018-08-11 VITALS — BP 129/78 | HR 88 | Temp 98.4°F | Resp 17 | Ht 68.0 in | Wt 201.6 lb

## 2018-08-11 DIAGNOSIS — I25810 Atherosclerosis of coronary artery bypass graft(s) without angina pectoris: Secondary | ICD-10-CM

## 2018-08-11 DIAGNOSIS — E1165 Type 2 diabetes mellitus with hyperglycemia: Secondary | ICD-10-CM

## 2018-08-11 DIAGNOSIS — I739 Peripheral vascular disease, unspecified: Secondary | ICD-10-CM

## 2018-08-11 LAB — POCT GLYCOSYLATED HEMOGLOBIN (HGB A1C): Hemoglobin A1C: 7.8 % — AB (ref 4.0–5.6)

## 2018-08-11 MED ORDER — METFORMIN HCL 1000 MG PO TABS: 1000.0000 mg | ORAL_TABLET | Freq: Two times a day (BID) | ORAL | 1 refills | Status: DC

## 2018-08-11 MED ORDER — CARVEDILOL 12.5 MG PO TABS: 12.5000 mg | ORAL_TABLET | Freq: Two times a day (BID) | ORAL | 1 refills | Status: DC

## 2018-08-11 MED ORDER — GLIPIZIDE 5 MG PO TABS: 5.0000 mg | ORAL_TABLET | Freq: Every day | ORAL | 1 refills | Status: DC

## 2018-08-11 NOTE — Patient Instructions (Addendum)
Lab Results  Component Value Date   HGBA1C 12.4 (H) 06/12/2018    Lab Results  Component Value Date   HGBA1C 7.8 (A) 08/11/2018       If you have lab work done today you will be contacted with your lab results within the next 2 weeks.  If you have not heard from [place] then please contact us. The fastest way to get your results is to register for My Chart.   IF you received an x-ray today, you will receive an invoice from [facility]. Please contact [facility] at [phone] with questions or concerns regarding your invoice.   IF you received labwork today, you will receive an invoice from [facility]. Please contact [facility] at [phone] with questions or concerns regarding your invoice.   Our billing staff will not be able to assist you with questions regarding bills from these companies.  You will be contacted with the lab results as soon as they are available. The fastest way to get your results is to activate your My Chart account. Instructions are located on the last page of this paperwork. If you have not heard from [place] regarding the results in 2 weeks, please contact this office.

## 2018-08-11 NOTE — Progress Notes (Signed)
Chief Complaint  Patient presents with  . Medication Refill    metformin, glipizide and carvedilol    HPI   He states that he had a triple bypass  Patient reports that he will be getting surgery for PAD on the left leg  He states that he is uninsured He states that his sugars stays high  He does not check it He lost some weight  Today he insists that he cannot afford labs or vaccines today He states that his cardiologist wants him to lose weight since discharge from the hospital He has been eating skinless checking, beans and vegetables He is out of metformin 1000mg  1 tablet bid He also takes glipizide 5mg  one tablet before breakfast  He takes carvedilol 12.5mg  bid  He has bypass surgery 08/21/18 and needs refills to help his sugars to help his surgery  Lab Results  Component Value Date   HGBA1C 12.4 (H) 06/12/2018    Lab Results  Component Value Date   HGBA1C 7.8 (A) 08/11/2018     The 10-year ASCVD risk score Denman George DC Jr., et al., 2013) is: 31.2%   Values used to calculate the score:     Age: 62 years     Sex: Male     Is Non-Hispanic African American: No     Diabetic: Yes     Tobacco smoker: Yes     Systolic Blood Pressure: 129 mmHg     Is BP treated: No     HDL Cholesterol: 24 mg/dL     Total Cholesterol: 148 mg/dL   Past Medical History:  Diagnosis Date  . Acute pulmonary edema (HCC) 06/05/2018  . Arthritis   . Diabetes mellitus without complication (HCC)   . Diabetic neuropathy (HCC)     Current Outpatient Medications  Medication Sig Dispense Refill  . amiodarone (PACERONE) 400 MG tablet Take 1 tablet (400 mg total) by mouth 2 (two) times daily. For 7 days, then decrease to 400 mg daily 60 tablet 1  . aspirin EC 325 MG EC tablet Take 1 tablet (325 mg total) by mouth daily. 30 tablet 0  . atorvastatin (LIPITOR) 40 MG tablet Take 1 tablet (40 mg total) by mouth daily at 6 PM. 30 tablet 3  . carvedilol (COREG) 12.5 MG tablet Take 1 tablet (12.5 mg  total) by mouth 2 (two) times daily with a meal. 180 tablet 1  . clopidogrel (PLAVIX) 75 MG tablet Take 1 tablet (75 mg total) by mouth daily. 30 tablet 11  . furosemide (LASIX) 40 MG tablet Take 1 tablet (40 mg total) by mouth daily. 30 tablet 1  . glipiZIDE (GLUCOTROL) 5 MG tablet Take 1 tablet (5 mg total) by mouth daily before breakfast. 90 tablet 1  . metFORMIN (GLUCOPHAGE) 1000 MG tablet Take 1 tablet (1,000 mg total) by mouth 2 (two) times daily with a meal. 180 tablet 1  . potassium chloride SA (K-DUR,KLOR-CON) 20 MEQ tablet Take 1 tablet (20 mEq total) by mouth daily. (Patient not taking: Reported on 08/11/2018) 30 tablet 1   No current facility-administered medications for this visit.     Allergies:  Allergies  Allergen Reactions  . Lisinopril Other (See Comments)    Syncope  . Codeine Rash    Past Surgical History:  Procedure Laterality Date  . ABDOMINAL AORTOGRAM W/LOWER EXTREMITY N/A 06/21/2018   Procedure: ABDOMINAL AORTOGRAM W/LOWER EXTREMITY;  Surgeon: Cephus Shelling, MD;  Location: [facility] INVASIVE CV LAB;  Service: Cardiovascular;  Laterality: N/A;  .  CORONARY ARTERY BYPASS GRAFT N/A 06/12/2018   Procedure: CORONARY ARTERY BYPASS GRAFTING (CABG) x 3 WITH ENDOSCOPIC HARVESTING OF RIGHT GREATER SAPHENOUS VEIN. LIMA TO LAD. SVG TO PD. SVG TO DIAGONAL.;  Surgeon: Kerin Perna, MD;  Location: [facility] OR;  Service: Open Heart Surgery;  Laterality: N/A;  . KNEE ARTHROSCOPY    . PERIPHERAL VASCULAR INTERVENTION Left 06/21/2018   Procedure: PERIPHERAL VASCULAR INTERVENTION;  Surgeon: Cephus Shelling, MD;  Location: [facility] INVASIVE CV LAB;  Service: Cardiovascular;  Laterality: Left;  . RIGHT/LEFT HEART CATH AND CORONARY ANGIOGRAPHY N/A 06/05/2018   Procedure: RIGHT/LEFT HEART CATH AND CORONARY ANGIOGRAPHY;  Surgeon: Orpah Cobb, MD;  Location: [facility] INVASIVE CV LAB;  Service: Cardiovascular;  Laterality: N/A;  . SPINE SURGERY    . TEE WITHOUT CARDIOVERSION N/A 06/12/2018    Procedure: TRANSESOPHAGEAL ECHOCARDIOGRAM (TEE);  Surgeon: Donata Clay, Theron Arista, MD;  Location: [facility] OR;  Service: Open Heart Surgery;  Laterality: N/A;    Social History   Socioeconomic History  . Marital status: Single    Spouse name: Not on file  . Number of children: Not on file  . Years of education: Not on file  . Highest education level: Not on file  Occupational History  . Not on file  Social Needs  . Financial resource strain: Not on file  . Food insecurity:    Worry: Not on file    Inability: Not on file  . Transportation needs:    Medical: Not on file    Non-medical: Not on file  Tobacco Use  . Smoking status: Former Smoker    Packs/day: 1.00    Years: 40.00    Pack years: 40.00    Last attempt to quit: 05/23/2018    Years since quitting: 0.2  . Smokeless tobacco: Never Used  Substance and Sexual Activity  . Alcohol use: Not Currently  . Drug use: No  . Sexual activity: Not on file  Lifestyle  . Physical activity:    Days per week: Not on file    Minutes per session: Not on file  . Stress: Not on file  Relationships  . Social connections:    Talks on phone: Not on file    Gets together: Not on file    Attends religious service: Not on file    Active member of club or organization: Not on file    Attends meetings of clubs or organizations: Not on file    Relationship status: Not on file  Other Topics Concern  . Not on file  Social History Narrative  . Not on file    Family History  Problem Relation Age of Onset  . Diabetes Mother   . Cancer Father   . Heart disease Father      ROS Review of Systems See HPI Constitution: No fevers or chills No malaise No diaphoresis Skin: No rash or itching Eyes: no blurry vision, no double vision GU: no dysuria or hematuria Neuro: no dizziness or headaches all others reviewed and negative   Objective: Vitals:   08/11/18 1230  BP: 129/78  Pulse: 88  Resp: 17  Temp: 98.4 F (36.9 C)  TempSrc: Oral    SpO2: 94%  Weight: 201 lb 9.6 oz (91.4 kg)  Height: 5\' 8"  (1.727 m)     Physical Exam  Constitutional: He is oriented to person, place, and time. He appears well-developed and well-nourished.  HENT:  Head: Normocephalic and atraumatic.  Eyes: Conjunctivae and EOM are normal.  Neck: Normal  range of motion. Neck supple.  Cardiovascular: Normal rate, regular rhythm and normal heart sounds.  Pulmonary/Chest: Effort normal and breath sounds normal. No stridor. No respiratory distress. He has no wheezes.  Neurological: He is alert and oriented to person, place, and time.  Skin: Skin is warm. Capillary refill takes less than 2 seconds.  Psychiatric: He has a normal mood and affect. His behavior is normal. Judgment and thought content normal.    Left foot without ulceration, warm, no palpable pulses  RIGHT/LEFT HEART CATH AND CORONARY ANGIOGRAPHY on 06/05/2018:  Conclusion     Prox RCA to Mid RCA lesion is 100% stenosed.  Prox Cx lesion is 100% stenosed.  Dist LM lesion is 50% stenosed.  Ost LAD to Prox LAD lesion is 70% stenosed.  2nd Mrg lesion is 80% stenosed.  Ost RPDA to RPDA lesion is 80% stenosed.  Dist RCA lesion is 80% stenosed.  Post Atrio lesion is 100% stenosed.  Dist LAD lesion is 70% stenosed.  LV end diastolic pressure is normal.     Assessment and Plan Robert Sanchez was seen today for medication refill.  Diagnoses and all orders for this visit:  Uncontrolled type 2 diabetes mellitus with hyperglycemia (HCC) -     HM Diabetes Foot Exam -     POCT glycosylated hemoglobin (Hb A1C) -     Comprehensive metabolic panel -     metFORMIN (GLUCOPHAGE) 1000 MG tablet; Take 1 tablet (1,000 mg total) by mouth 2 (two) times daily with a meal. -     glipiZIDE (GLUCOTROL) 5 MG tablet; Take 1 tablet (5 mg total) by mouth daily before breakfast.  PVD (peripheral vascular disease) (HCC)  Coronary artery disease involving coronary bypass graft of native heart without  angina pectoris -     carvedilol (COREG) 12.5 MG tablet; Take 1 tablet (12.5 mg total) by mouth 2 (two) times daily with a meal.  Continue current medications Discussed that there are patient assistance program and discussed [facility] and Wellness program  Discussed that he did not go back to [facility] because of the need for the meds right away He will follow up with [facility] Discussed that he is high risk for heart attack and stroke Pt states that no one told him this Discussed that he has improved his diet He quit smoking and has cut down dramatically on his drinking I am still very concerned that his diabetes is not appropriately managed due to his financial stress. Discussed that he needs to improve his diet and follow up with [facility] to see his PCP Also discussed that he should go to [facility] and meet with someone in the Financial Assistance Department   Raynelle Fujikawa A [employer]

## 2018-08-12 LAB — COMPREHENSIVE METABOLIC PANEL
ALT: 19 IU/L (ref 0–44)
AST: 13 IU/L (ref 0–40)
Albumin/Globulin Ratio: 1.1 — ABNORMAL LOW (ref 1.2–2.2)
Albumin: 4.1 g/dL (ref 3.6–4.8)
Alkaline Phosphatase: 86 IU/L (ref 39–117)
BUN/Creatinine Ratio: 13 (ref 10–24)
BUN: 14 mg/dL (ref 8–27)
Bilirubin Total: 0.4 mg/dL (ref 0.0–1.2)
CALCIUM: 9.5 mg/dL (ref 8.6–10.2)
CO2: 20 mmol/L (ref 20–29)
CREATININE: 1.09 mg/dL (ref 0.76–1.27)
Chloride: 97 mmol/L (ref 96–106)
GFR, EST AFRICAN AMERICAN: 85 mL/min/{1.73_m2} (ref >59)
GFR, EST NON AFRICAN AMERICAN: 73 mL/min/{1.73_m2} (ref >59)
Globulin, Total: 3.7 g/dL (ref 1.5–4.5)
Glucose: 154 mg/dL — ABNORMAL HIGH (ref 65–99)
Potassium: 4.6 mmol/L (ref 3.5–5.2)
Sodium: 135 mmol/L (ref 134–144)
TOTAL PROTEIN: 7.8 g/dL (ref 6.0–8.5)

## 2018-08-14 ENCOUNTER — Other Ambulatory Visit: Payer: Self-pay | Admitting: *Deleted

## 2018-08-14 ENCOUNTER — Encounter: Payer: Self-pay | Admitting: *Deleted

## 2018-08-17 ENCOUNTER — Encounter (HOSPITAL_COMMUNITY): Payer: Self-pay

## 2018-08-17 ENCOUNTER — Other Ambulatory Visit: Payer: Self-pay

## 2018-08-17 ENCOUNTER — Encounter (HOSPITAL_COMMUNITY)
Admission: RE | Admit: 2018-08-17 | Discharge: 2018-08-17 | Disposition: A | Discharge status: Home / Self Care | Payer: Self-pay | Source: Ambulatory Visit | Attending: Vascular Surgery | Admitting: Vascular Surgery

## 2018-08-17 DIAGNOSIS — Z01812 Encounter for preprocedural laboratory examination: Secondary | ICD-10-CM | POA: Insufficient documentation

## 2018-08-17 HISTORY — DX: Chronic obstructive pulmonary disease, unspecified: J44.9

## 2018-08-17 HISTORY — DX: Gastro-esophageal reflux disease without esophagitis: K21.9

## 2018-08-17 HISTORY — DX: Acute myocardial infarction, unspecified: I21.9

## 2018-08-17 HISTORY — DX: Atherosclerotic heart disease of native coronary artery without angina pectoris: I25.10

## 2018-08-17 HISTORY — DX: Unspecified hearing loss, unspecified ear: H91.90

## 2018-08-17 HISTORY — DX: Emphysema, unspecified: J43.9

## 2018-08-17 HISTORY — DX: Peripheral vascular disease, unspecified: I73.9

## 2018-08-17 LAB — CBC
HCT: 36.6 % — ABNORMAL LOW (ref 39.0–52.0)
HEMOGLOBIN: 11.2 g/dL — AB (ref 13.0–17.0)
MCH: 27.3 pg (ref 26.0–34.0)
MCHC: 30.6 g/dL (ref 30.0–36.0)
MCV: 89.1 fL (ref 80.0–100.0)
Platelets: 358 10*3/uL (ref 150–400)
RBC: 4.11 MIL/uL — ABNORMAL LOW (ref 4.22–5.81)
RDW: 14.5 % (ref 11.5–15.5)
WBC: 9 10*3/uL (ref 4.0–10.5)
nRBC: 0 % (ref 0.0–0.2)

## 2018-08-17 LAB — APTT: APTT: 30 s (ref 24–36)

## 2018-08-17 LAB — COMPREHENSIVE METABOLIC PANEL
ALK PHOS: 58 U/L (ref 38–126)
ALT: 25 U/L (ref 0–44)
AST: 29 U/L (ref 15–41)
Albumin: 3.5 g/dL (ref 3.5–5.0)
Anion gap: 14 (ref 5–15)
BUN: 20 mg/dL (ref 6–20)
CALCIUM: 9.5 mg/dL (ref 8.9–10.3)
CO2: 23 mmol/L (ref 22–32)
CREATININE: 1.46 mg/dL — AB (ref 0.61–1.24)
Chloride: 101 mmol/L (ref 98–111)
GFR, EST AFRICAN AMERICAN: 59 mL/min — AB (ref >60)
GFR, EST NON AFRICAN AMERICAN: 51 mL/min — AB (ref >60)
Glucose, Bld: 189 mg/dL — ABNORMAL HIGH (ref 70–99)
Potassium: 4.7 mmol/L (ref 3.5–5.1)
SODIUM: 138 mmol/L (ref 135–145)
TOTAL PROTEIN: 7.6 g/dL (ref 6.5–8.1)
Total Bilirubin: 1.5 mg/dL — ABNORMAL HIGH (ref 0.3–1.2)

## 2018-08-17 LAB — GLUCOSE, CAPILLARY: GLUCOSE-CAPILLARY: 177 mg/dL — AB (ref 70–99)

## 2018-08-17 LAB — URINALYSIS, ROUTINE W REFLEX MICROSCOPIC
BACTERIA UA: NONE SEEN
Bilirubin Urine: NEGATIVE
Glucose, UA: NEGATIVE mg/dL
Hgb urine dipstick: NEGATIVE
Ketones, ur: NEGATIVE mg/dL
LEUKOCYTES UA: NEGATIVE
Nitrite: NEGATIVE
PROTEIN: NEGATIVE mg/dL
SPECIFIC GRAVITY, URINE: 1.02 (ref 1.005–1.030)
pH: 5 (ref 5.0–8.0)

## 2018-08-17 LAB — SURGICAL PCR SCREEN
MRSA, PCR: NEGATIVE
Staphylococcus aureus: NEGATIVE

## 2018-08-17 LAB — TYPE AND SCREEN
ABO/RH(D): O NEG
Antibody Screen: NEGATIVE

## 2018-08-17 LAB — PROTIME-INR
INR: 1.19
PROTHROMBIN TIME: 15 s (ref 11.4–15.2)

## 2018-08-17 NOTE — Progress Notes (Signed)
Pt denies SOB and chest pain. Pt under the care of Dr. Algie Coffer, Cardiology. Pt stated that last dose of Plavix was Tuesday, 08/15/18 as instructed by MD. Pt verbalized understanding of all pre-op instructions. Pt chart forwarded to anesthesia to review cardiac history. Repeat ABG's on DOS.

## 2018-08-17 NOTE — Pre-Procedure Instructions (Signed)
Robert Sanchez  08/17/2018     [facility] - [place], [place] - [address] [place] [address] Phone: [phone] Fax: [phone]    Your procedure is scheduled on Monday, August 21, 2018  Report to [facility] Admitting at 5:30 A.M.  Call this number if you have problems the morning of surgery:  [phone]   Remember:  Do not eat or drink after midnight Sunday, August 20, 2018  Take these medicines the morning of surgery with A SIP OF WATER: amiodarone (PACERONE), aspirin, carvedilol (COREG)  Stop taking vitamins, fish oil and herbal medications. Do not take any NSAIDs ie: Ibuprofen, Advil, Naproxen (Aleve), Motrin, BC and Goody Powder; stop now.    How to Manage Your Diabetes Before and After Surgery  Why is it important to control my blood sugar before and after surgery? . Improving blood sugar levels before and after surgery helps healing and can limit problems. . A way of improving blood sugar control is eating a healthy diet by: o  Eating less sugar and carbohydrates o  Increasing activity/exercise o  Talking with your doctor about reaching your blood sugar goals . High blood sugars (greater than 180 mg/dL) can raise your risk of infections and slow your recovery, so you will need to focus on controlling your diabetes during the weeks before surgery. . Make sure that the doctor who takes care of your diabetes knows about your planned surgery including the date and location.  How do I manage my blood sugar before surgery? . Check your blood sugar at least 4 times a day, starting 2 days before surgery, to make sure that the level is not too high or low. o Check your blood sugar the morning of your surgery when you wake up and every 2 hours until you get to the Short Stay unit. . If your blood sugar is less than 70 mg/dL, you will need to treat for low blood sugar: o Do not take insulin. o Treat a low blood  sugar (less than 70 mg/dL) with  cup of clear juice (cranberry or apple), 4 glucose tablets, OR glucose gel. Recheck blood sugar in 15 minutes after treatment (to make sure it is greater than 70 mg/dL). If your blood sugar is not greater than 70 mg/dL on recheck, call [phone] o  for further instructions. . Report your blood sugar to the short stay nurse when you get to Short Stay.  . If you are admitted to the hospital after surgery: o Your blood sugar will be checked by the staff and you will probably be given insulin after surgery (instead of oral diabetes medicines) to make sure you have good blood sugar levels. o The goal for blood sugar control after surgery is 80-180 mg/dL  WHAT DO I DO ABOUT MY DIABETES MEDICATION?  Marland Kitchen Do not take oral diabetes medicines (pills) the morning of surgery such as glipiZIDE (GLUCOTROL) and  metFORMIN (GLUCOPHAGE)  Reviewed and Endorsed by [facility] Patient Education Committee, August 2015  Do not wear jewelry, make-up or nail polish.  Do not wear lotions, powders, or perfumes, or deodorant.  Do not shave 48 hours prior to surgery.  Men may shave face and neck.  Do not bring valuables to the hospital.  [facility] is not responsible for any belongings or valuables.  Contacts, dentures or bridgework may not be worn into surgery.  Leave your suitcase in the car.  After surgery  it may be brought to your room. Special instructions: See " [place]- Preparing For Surgery " sheet. Please read over the following fact sheets that you were given. Pain Booklet, Coughing and Deep Breathing, MRSA Information and Surgical Site Infection Prevention

## 2018-08-21 ENCOUNTER — Inpatient Hospital Stay (HOSPITAL_COMMUNITY): Payer: Self-pay | Admitting: Anesthesiology

## 2018-08-21 ENCOUNTER — Telehealth: Payer: Self-pay | Admitting: Vascular Surgery

## 2018-08-21 ENCOUNTER — Inpatient Hospital Stay (HOSPITAL_COMMUNITY): Payer: Self-pay | Admitting: Physician Assistant

## 2018-08-21 ENCOUNTER — Encounter (HOSPITAL_COMMUNITY): Payer: Self-pay | Admitting: Surgery

## 2018-08-21 ENCOUNTER — Encounter (HOSPITAL_COMMUNITY)
Admission: RE | Disposition: A | Discharge status: Home Health | Payer: Self-pay | Source: Home / Self Care | Attending: Vascular Surgery

## 2018-08-21 ENCOUNTER — Inpatient Hospital Stay (HOSPITAL_COMMUNITY)
Admission: RE | Admit: 2018-08-21 | Discharge: 2018-08-24 | DRG: 254 | Disposition: A | Discharge status: Home Health | Payer: Self-pay | Attending: Vascular Surgery | Admitting: Vascular Surgery

## 2018-08-21 ENCOUNTER — Encounter (HOSPITAL_COMMUNITY): Payer: Self-pay

## 2018-08-21 ENCOUNTER — Other Ambulatory Visit: Payer: Self-pay

## 2018-08-21 DIAGNOSIS — Z7984 Long term (current) use of oral hypoglycemic drugs: Secondary | ICD-10-CM

## 2018-08-21 DIAGNOSIS — Z7982 Long term (current) use of aspirin: Secondary | ICD-10-CM

## 2018-08-21 DIAGNOSIS — Z79899 Other long term (current) drug therapy: Secondary | ICD-10-CM

## 2018-08-21 DIAGNOSIS — Z833 Family history of diabetes mellitus: Secondary | ICD-10-CM

## 2018-08-21 DIAGNOSIS — I739 Peripheral vascular disease, unspecified: Secondary | ICD-10-CM | POA: Diagnosis present

## 2018-08-21 DIAGNOSIS — Z23 Encounter for immunization: Secondary | ICD-10-CM

## 2018-08-21 DIAGNOSIS — E1140 Type 2 diabetes mellitus with diabetic neuropathy, unspecified: Secondary | ICD-10-CM | POA: Diagnosis present

## 2018-08-21 DIAGNOSIS — I10 Essential (primary) hypertension: Secondary | ICD-10-CM | POA: Diagnosis present

## 2018-08-21 DIAGNOSIS — E1151 Type 2 diabetes mellitus with diabetic peripheral angiopathy without gangrene: Principal | ICD-10-CM | POA: Diagnosis present

## 2018-08-21 DIAGNOSIS — Z87891 Personal history of nicotine dependence: Secondary | ICD-10-CM

## 2018-08-21 DIAGNOSIS — I998 Other disorder of circulatory system: Secondary | ICD-10-CM | POA: Diagnosis present

## 2018-08-21 DIAGNOSIS — K219 Gastro-esophageal reflux disease without esophagitis: Secondary | ICD-10-CM | POA: Diagnosis present

## 2018-08-21 DIAGNOSIS — I252 Old myocardial infarction: Secondary | ICD-10-CM

## 2018-08-21 DIAGNOSIS — I70222 Atherosclerosis of native arteries of extremities with rest pain, left leg: Secondary | ICD-10-CM

## 2018-08-21 DIAGNOSIS — I2510 Atherosclerotic heart disease of native coronary artery without angina pectoris: Secondary | ICD-10-CM | POA: Diagnosis present

## 2018-08-21 DIAGNOSIS — Z7902 Long term (current) use of antithrombotics/antiplatelets: Secondary | ICD-10-CM

## 2018-08-21 DIAGNOSIS — Z951 Presence of aortocoronary bypass graft: Secondary | ICD-10-CM

## 2018-08-21 DIAGNOSIS — J449 Chronic obstructive pulmonary disease, unspecified: Secondary | ICD-10-CM | POA: Diagnosis present

## 2018-08-21 HISTORY — PX: FEMORAL-TIBIAL BYPASS GRAFT: SHX938

## 2018-08-21 HISTORY — PX: VEIN HARVEST: SHX6363

## 2018-08-21 LAB — CREATININE, SERUM
CREATININE: 1.59 mg/dL — AB (ref 0.61–1.24)
GFR, EST AFRICAN AMERICAN: 53 mL/min — AB (ref >60)
GFR, EST NON AFRICAN AMERICAN: 46 mL/min — AB (ref >60)

## 2018-08-21 LAB — CBC
HCT: 33 % — ABNORMAL LOW (ref 39.0–52.0)
HEMOGLOBIN: 10.1 g/dL — AB (ref 13.0–17.0)
MCH: 27.5 pg (ref 26.0–34.0)
MCHC: 30.6 g/dL (ref 30.0–36.0)
MCV: 89.9 fL (ref 80.0–100.0)
NRBC: 0 % (ref 0.0–0.2)
Platelets: 403 10*3/uL — ABNORMAL HIGH (ref 150–400)
RBC: 3.67 MIL/uL — ABNORMAL LOW (ref 4.22–5.81)
RDW: 14.7 % (ref 11.5–15.5)
WBC: 16.4 10*3/uL — AB (ref 4.0–10.5)

## 2018-08-21 LAB — GLUCOSE, CAPILLARY
GLUCOSE-CAPILLARY: 116 mg/dL — AB (ref 70–99)
GLUCOSE-CAPILLARY: 184 mg/dL — AB (ref 70–99)
GLUCOSE-CAPILLARY: 171 mg/dL — AB (ref 70–99)
Glucose-Capillary: 187 mg/dL — ABNORMAL HIGH (ref 70–99)

## 2018-08-21 LAB — POCT ACTIVATED CLOTTING TIME: ACTIVATED CLOTTING TIME: 219 s

## 2018-08-21 SURGERY — CREATION, BYPASS, ARTERIAL, FEMORAL TO TIBIAL, USING GRAFT
Anesthesia: General | Laterality: Left

## 2018-08-21 MED ORDER — CARVEDILOL 12.5 MG PO TABS
12.5000 mg | ORAL_TABLET | Freq: Two times a day (BID) | ORAL | Status: DC
  Administered 2018-08-21 – 2018-08-24 (×5): 12.5 mg via ORAL
  Filled 2018-08-21 (×6): qty 1

## 2018-08-21 MED ORDER — ACETAMINOPHEN 325 MG PO TABS: 325.0000 mg | ORAL_TABLET | ORAL | Status: DC | PRN

## 2018-08-21 MED ORDER — SODIUM CHLORIDE 0.9 % IV SOLN
INTRAVENOUS | Status: DC | PRN
  Administered 2018-08-21: 07:00:00

## 2018-08-21 MED ORDER — MORPHINE SULFATE (PF) 2 MG/ML IV SOLN
2.0000 mg | INTRAVENOUS | Status: DC | PRN
  Administered 2018-08-21 (×2): 2 mg via INTRAVENOUS
  Filled 2018-08-21 (×2): qty 1

## 2018-08-21 MED ORDER — LIDOCAINE 2% (20 MG/ML) 5 ML SYRINGE
INTRAMUSCULAR | Status: DC | PRN
  Administered 2018-08-21: 60 mg via INTRAVENOUS

## 2018-08-21 MED ORDER — CHLORHEXIDINE GLUCONATE CLOTH 2 % EX PADS: 6.0000 | MEDICATED_PAD | Freq: Once | CUTANEOUS | Status: DC

## 2018-08-21 MED ORDER — LACTATED RINGERS IV SOLN
INTRAVENOUS | Status: DC | PRN
  Administered 2018-08-21 (×3): via INTRAVENOUS

## 2018-08-21 MED ORDER — GUAIFENESIN-DM 100-10 MG/5ML PO SYRP: 15.0000 mL | ORAL_SOLUTION | ORAL | Status: DC | PRN

## 2018-08-21 MED ORDER — METOPROLOL TARTRATE 5 MG/5ML IV SOLN: 2.0000 mg | INTRAVENOUS | Status: DC | PRN

## 2018-08-21 MED ORDER — SODIUM CHLORIDE 0.9 % IV SOLN: INTRAVENOUS | Status: DC

## 2018-08-21 MED ORDER — FUROSEMIDE 40 MG PO TABS
40.0000 mg | ORAL_TABLET | Freq: Every day | ORAL | Status: DC
  Administered 2018-08-21 – 2018-08-24 (×4): 40 mg via ORAL
  Filled 2018-08-21 (×4): qty 1

## 2018-08-21 MED ORDER — MIDAZOLAM HCL 5 MG/5ML IJ SOLN
INTRAMUSCULAR | Status: DC | PRN
  Administered 2018-08-21: 2 mg via INTRAVENOUS

## 2018-08-21 MED ORDER — ACETAMINOPHEN 650 MG RE SUPP: 325.0000 mg | RECTAL | Status: DC | PRN

## 2018-08-21 MED ORDER — LABETALOL HCL 5 MG/ML IV SOLN: 10.0000 mg | INTRAVENOUS | Status: DC | PRN

## 2018-08-21 MED ORDER — SODIUM CHLORIDE 0.9 % IV SOLN
INTRAVENOUS | Status: DC
  Administered 2018-08-21: 17:00:00 via INTRAVENOUS

## 2018-08-21 MED ORDER — POLYETHYLENE GLYCOL 3350 17 G PO PACK: 17.0000 g | PACK | Freq: Every day | ORAL | Status: DC | PRN

## 2018-08-21 MED ORDER — HEMOSTATIC AGENTS (NO CHARGE) OPTIME
TOPICAL | Status: DC | PRN
  Administered 2018-08-21: 1 via TOPICAL

## 2018-08-21 MED ORDER — ALUM & MAG HYDROXIDE-SIMETH 200-200-20 MG/5ML PO SUSP: 15.0000 mL | ORAL | Status: DC | PRN

## 2018-08-21 MED ORDER — CLOPIDOGREL BISULFATE 75 MG PO TABS
75.0000 mg | ORAL_TABLET | Freq: Every day | ORAL | Status: DC
  Administered 2018-08-22 – 2018-08-24 (×3): 75 mg via ORAL
  Filled 2018-08-21 (×3): qty 1

## 2018-08-21 MED ORDER — FENTANYL CITRATE (PF) 250 MCG/5ML IJ SOLN
INTRAMUSCULAR | Status: DC | PRN
  Administered 2018-08-21: 50 ug via INTRAVENOUS
  Administered 2018-08-21: 25 ug via INTRAVENOUS
  Administered 2018-08-21 (×5): 50 ug via INTRAVENOUS
  Administered 2018-08-21: 100 ug via INTRAVENOUS

## 2018-08-21 MED ORDER — SODIUM CHLORIDE 0.9 % IV SOLN
INTRAVENOUS | Status: DC | PRN
  Administered 2018-08-21: 20 ug/min via INTRAVENOUS
  Administered 2018-08-21: 10:00:00 via INTRAVENOUS

## 2018-08-21 MED ORDER — DOCUSATE SODIUM 100 MG PO CAPS
100.0000 mg | ORAL_CAPSULE | Freq: Every day | ORAL | Status: DC
  Administered 2018-08-22 – 2018-08-24 (×3): 100 mg via ORAL
  Filled 2018-08-21 (×3): qty 1

## 2018-08-21 MED ORDER — OXYCODONE HCL 5 MG PO TABS: 5.0000 mg | ORAL_TABLET | Freq: Once | ORAL | Status: DC | PRN

## 2018-08-21 MED ORDER — INSULIN ASPART 100 UNIT/ML ~~LOC~~ SOLN
0.0000 [IU] | Freq: Three times a day (TID) | SUBCUTANEOUS | Status: DC
  Administered 2018-08-21 – 2018-08-22 (×2): 3 [IU] via SUBCUTANEOUS
  Administered 2018-08-22: 2 [IU] via SUBCUTANEOUS
  Administered 2018-08-22 – 2018-08-23 (×2): 3 [IU] via SUBCUTANEOUS
  Administered 2018-08-24: 2 [IU] via SUBCUTANEOUS

## 2018-08-21 MED ORDER — ACETAMINOPHEN 10 MG/ML IV SOLN: 1000.0000 mg | Freq: Once | INTRAVENOUS | Status: DC | PRN

## 2018-08-21 MED ORDER — ASPIRIN EC 325 MG PO TBEC
325.0000 mg | DELAYED_RELEASE_TABLET | Freq: Every day | ORAL | Status: DC
  Administered 2018-08-22 – 2018-08-24 (×3): 325 mg via ORAL
  Filled 2018-08-21 (×3): qty 1

## 2018-08-21 MED ORDER — CEFAZOLIN SODIUM 1 G IJ SOLR
INTRAMUSCULAR | Status: AC
  Filled 2018-08-21: qty 20

## 2018-08-21 MED ORDER — ONDANSETRON HCL 4 MG/2ML IJ SOLN
4.0000 mg | Freq: Four times a day (QID) | INTRAMUSCULAR | Status: DC | PRN
  Administered 2018-08-21: 4 mg via INTRAVENOUS
  Filled 2018-08-21: qty 2

## 2018-08-21 MED ORDER — POTASSIUM CHLORIDE CRYS ER 20 MEQ PO TBCR: 20.0000 meq | EXTENDED_RELEASE_TABLET | Freq: Every day | ORAL | Status: DC | PRN

## 2018-08-21 MED ORDER — HYDRALAZINE HCL 20 MG/ML IJ SOLN: 5.0000 mg | INTRAMUSCULAR | Status: DC | PRN

## 2018-08-21 MED ORDER — MAGNESIUM SULFATE 2 GM/50ML IV SOLN: 2.0000 g | Freq: Every day | INTRAVENOUS | Status: DC | PRN

## 2018-08-21 MED ORDER — SODIUM CHLORIDE 0.9 % IV SOLN: 500.0000 mL | Freq: Once | INTRAVENOUS | Status: DC | PRN

## 2018-08-21 MED ORDER — PAPAVERINE HCL 30 MG/ML IJ SOLN
INTRAMUSCULAR | Status: AC
  Filled 2018-08-21: qty 2

## 2018-08-21 MED ORDER — MIDAZOLAM HCL 2 MG/2ML IJ SOLN
INTRAMUSCULAR | Status: AC
  Filled 2018-08-21: qty 2

## 2018-08-21 MED ORDER — ACETAMINOPHEN 500 MG PO TABS: 1000.0000 mg | ORAL_TABLET | Freq: Once | ORAL | Status: DC | PRN

## 2018-08-21 MED ORDER — SODIUM CHLORIDE 0.9 % IV SOLN
INTRAVENOUS | Status: AC
  Filled 2018-08-21: qty 1.2

## 2018-08-21 MED ORDER — FENTANYL CITRATE (PF) 250 MCG/5ML IJ SOLN
INTRAMUSCULAR | Status: AC
  Filled 2018-08-21: qty 5

## 2018-08-21 MED ORDER — INFLUENZA VAC SPLIT QUAD 0.5 ML IM SUSY
0.5000 mL | PREFILLED_SYRINGE | INTRAMUSCULAR | Status: AC
  Administered 2018-08-22: 0.5 mL via INTRAMUSCULAR
  Filled 2018-08-21: qty 0.5

## 2018-08-21 MED ORDER — OXYCODONE-ACETAMINOPHEN 5-325 MG PO TABS
1.0000 | ORAL_TABLET | ORAL | Status: DC | PRN
  Administered 2018-08-21 – 2018-08-23 (×8): 2 via ORAL
  Filled 2018-08-21 (×8): qty 2

## 2018-08-21 MED ORDER — HEPARIN SODIUM (PORCINE) 1000 UNIT/ML IJ SOLN
INTRAMUSCULAR | Status: AC
  Filled 2018-08-21: qty 2

## 2018-08-21 MED ORDER — ROCURONIUM BROMIDE 10 MG/ML (PF) SYRINGE
PREFILLED_SYRINGE | INTRAVENOUS | Status: DC | PRN
  Administered 2018-08-21: 50 mg via INTRAVENOUS

## 2018-08-21 MED ORDER — FENTANYL CITRATE (PF) 100 MCG/2ML IJ SOLN: 25.0000 ug | INTRAMUSCULAR | Status: DC | PRN

## 2018-08-21 MED ORDER — HEPARIN SODIUM (PORCINE) 5000 UNIT/ML IJ SOLN
5000.0000 [IU] | Freq: Three times a day (TID) | INTRAMUSCULAR | Status: DC
  Administered 2018-08-22 – 2018-08-24 (×6): 5000 [IU] via SUBCUTANEOUS
  Filled 2018-08-21 (×6): qty 1

## 2018-08-21 MED ORDER — OXYCODONE HCL 5 MG/5ML PO SOLN: 5.0000 mg | Freq: Once | ORAL | Status: DC | PRN

## 2018-08-21 MED ORDER — PHENOL 1.4 % MT LIQD: 1.0000 | OROMUCOSAL | Status: DC | PRN

## 2018-08-21 MED ORDER — 0.9 % SODIUM CHLORIDE (POUR BTL) OPTIME
TOPICAL | Status: DC | PRN
  Administered 2018-08-21: 2000 mL

## 2018-08-21 MED ORDER — DEXAMETHASONE SODIUM PHOSPHATE 10 MG/ML IJ SOLN
INTRAMUSCULAR | Status: DC | PRN
  Administered 2018-08-21: 5 mg via INTRAVENOUS

## 2018-08-21 MED ORDER — BISACODYL 10 MG RE SUPP: 10.0000 mg | Freq: Every day | RECTAL | Status: DC | PRN

## 2018-08-21 MED ORDER — GLIPIZIDE 5 MG PO TABS
5.0000 mg | ORAL_TABLET | Freq: Every day | ORAL | Status: DC
  Administered 2018-08-22 – 2018-08-24 (×3): 5 mg via ORAL
  Filled 2018-08-21 (×3): qty 1

## 2018-08-21 MED ORDER — PROTAMINE SULFATE 10 MG/ML IV SOLN
INTRAVENOUS | Status: DC | PRN
  Administered 2018-08-21: 10 mg via INTRAVENOUS
  Administered 2018-08-21: 30 mg via INTRAVENOUS

## 2018-08-21 MED ORDER — PROPOFOL 10 MG/ML IV BOLUS
INTRAVENOUS | Status: AC
  Filled 2018-08-21: qty 40

## 2018-08-21 MED ORDER — PAPAVERINE HCL 30 MG/ML IJ SOLN
INTRAMUSCULAR | Status: DC | PRN
  Administered 2018-08-21: 60 mg

## 2018-08-21 MED ORDER — ONDANSETRON HCL 4 MG/2ML IJ SOLN
INTRAMUSCULAR | Status: AC
  Filled 2018-08-21: qty 2

## 2018-08-21 MED ORDER — ACETAMINOPHEN 160 MG/5ML PO SOLN: 1000.0000 mg | Freq: Once | ORAL | Status: DC | PRN

## 2018-08-21 MED ORDER — ATORVASTATIN CALCIUM 40 MG PO TABS
40.0000 mg | ORAL_TABLET | Freq: Every day | ORAL | Status: DC
  Administered 2018-08-21 – 2018-08-23 (×3): 40 mg via ORAL
  Filled 2018-08-21 (×3): qty 1

## 2018-08-21 MED ORDER — CEFAZOLIN SODIUM-DEXTROSE 2-4 GM/100ML-% IV SOLN
2.0000 g | INTRAVENOUS | Status: AC
  Administered 2018-08-21 (×2): 2 g via INTRAVENOUS
  Filled 2018-08-21: qty 100

## 2018-08-21 MED ORDER — PANTOPRAZOLE SODIUM 40 MG PO TBEC
40.0000 mg | DELAYED_RELEASE_TABLET | Freq: Every day | ORAL | Status: DC
  Administered 2018-08-21 – 2018-08-24 (×4): 40 mg via ORAL
  Filled 2018-08-21 (×4): qty 1

## 2018-08-21 MED ORDER — LIDOCAINE 2% (20 MG/ML) 5 ML SYRINGE
INTRAMUSCULAR | Status: AC
  Filled 2018-08-21: qty 5

## 2018-08-21 MED ORDER — CEFAZOLIN SODIUM-DEXTROSE 2-4 GM/100ML-% IV SOLN
2.0000 g | Freq: Three times a day (TID) | INTRAVENOUS | Status: AC
  Administered 2018-08-21 – 2018-08-22 (×2): 2 g via INTRAVENOUS
  Filled 2018-08-21 (×2): qty 100

## 2018-08-21 MED ORDER — HEPARIN SODIUM (PORCINE) 1000 UNIT/ML IJ SOLN
INTRAMUSCULAR | Status: DC | PRN
  Administered 2018-08-21: 10000 [IU] via INTRAVENOUS
  Administered 2018-08-21: 2000 [IU] via INTRAVENOUS

## 2018-08-21 MED ORDER — ONDANSETRON HCL 4 MG/2ML IJ SOLN
INTRAMUSCULAR | Status: DC | PRN
  Administered 2018-08-21: 4 mg via INTRAVENOUS

## 2018-08-21 MED ORDER — SUGAMMADEX SODIUM 200 MG/2ML IV SOLN
INTRAVENOUS | Status: DC | PRN
  Administered 2018-08-21: 150 mg via INTRAVENOUS

## 2018-08-21 MED ORDER — HEPARIN SODIUM (PORCINE) 1000 UNIT/ML IJ SOLN
INTRAMUSCULAR | Status: AC
  Filled 2018-08-21: qty 1

## 2018-08-21 MED ORDER — EPHEDRINE 5 MG/ML INJ
INTRAVENOUS | Status: AC
  Filled 2018-08-21: qty 10

## 2018-08-21 MED ORDER — EPHEDRINE SULFATE-NACL 50-0.9 MG/10ML-% IV SOSY
PREFILLED_SYRINGE | INTRAVENOUS | Status: DC | PRN
  Administered 2018-08-21: 10 mg via INTRAVENOUS

## 2018-08-21 MED ORDER — AMIODARONE HCL 200 MG PO TABS
200.0000 mg | ORAL_TABLET | Freq: Every day | ORAL | Status: DC
  Administered 2018-08-22 – 2018-08-24 (×3): 200 mg via ORAL
  Filled 2018-08-21 (×3): qty 1

## 2018-08-21 MED ORDER — PROPOFOL 10 MG/ML IV BOLUS
INTRAVENOUS | Status: DC | PRN
  Administered 2018-08-21: 20 mg via INTRAVENOUS
  Administered 2018-08-21: 120 mg via INTRAVENOUS

## 2018-08-21 MED ORDER — DEXAMETHASONE SODIUM PHOSPHATE 10 MG/ML IJ SOLN
INTRAMUSCULAR | Status: AC
  Filled 2018-08-21: qty 1

## 2018-08-21 SURGICAL SUPPLY — 61 items
ADH SKN CLS APL DERMABOND .7 (GAUZE/BANDAGES/DRESSINGS) ×4
CANISTER SUCT 3000ML PPV (MISCELLANEOUS) ×3 IMPLANT
CANNULA VESSEL 3MM 2 BLNT TIP (CANNULA) ×4 IMPLANT
CLIP VESOCCLUDE MED 24/CT (CLIP) ×3 IMPLANT
CLIP VESOCCLUDE SM WIDE 24/CT (CLIP) ×5 IMPLANT
COVER MAYO STAND STRL (DRAPES) ×2 IMPLANT
COVER PROBE W GEL 5X96 (DRAPES) ×3 IMPLANT
COVER WAND RF STERILE (DRAPES) ×3 IMPLANT
DERMABOND ADVANCED (GAUZE/BANDAGES/DRESSINGS) ×8
DERMABOND ADVANCED .7 DNX12 (GAUZE/BANDAGES/DRESSINGS) IMPLANT
DRAIN CHANNEL 15F RND FF W/TCR (WOUND CARE) IMPLANT
DRAPE C-ARM 42X72 X-RAY (DRAPES) ×3 IMPLANT
ELECT REM PT RETURN 9FT ADLT (ELECTROSURGICAL) ×3
ELECTRODE REM PT RTRN 9FT ADLT (ELECTROSURGICAL) ×1 IMPLANT
EVACUATOR SILICONE 100CC (DRAIN) IMPLANT
GLOVE BIO SURGEON STRL SZ 6.5 (GLOVE) ×3 IMPLANT
GLOVE BIO SURGEON STRL SZ7.5 (GLOVE) ×3 IMPLANT
GLOVE BIO SURGEONS STRL SZ 6.5 (GLOVE) ×3
GLOVE BIOGEL PI IND STRL 6.5 (GLOVE) IMPLANT
GLOVE BIOGEL PI IND STRL 7.0 (GLOVE) IMPLANT
GLOVE BIOGEL PI IND STRL 8 (GLOVE) ×1 IMPLANT
GLOVE BIOGEL PI INDICATOR 6.5 (GLOVE) ×6
GLOVE BIOGEL PI INDICATOR 7.0 (GLOVE) ×10
GLOVE BIOGEL PI INDICATOR 8 (GLOVE) ×2
GLOVE ECLIPSE 6.5 STRL STRAW (GLOVE) ×6 IMPLANT
GLOVE SURG SS PI 6.5 STRL IVOR (GLOVE) ×2 IMPLANT
GOWN STRL NON-REIN LRG LVL3 (GOWN DISPOSABLE) ×2 IMPLANT
GOWN STRL REUS W/ TWL LRG LVL3 (GOWN DISPOSABLE) ×2 IMPLANT
GOWN STRL REUS W/ TWL XL LVL3 (GOWN DISPOSABLE) ×2 IMPLANT
GOWN STRL REUS W/TWL LRG LVL3 (GOWN DISPOSABLE) ×15
GOWN STRL REUS W/TWL XL LVL3 (GOWN DISPOSABLE) ×3
HEMOSTAT SNOW SURGICEL 2X4 (HEMOSTASIS) ×2 IMPLANT
KIT BASIN OR (CUSTOM PROCEDURE TRAY) ×3 IMPLANT
KIT TURNOVER KIT B (KITS) ×3 IMPLANT
LOOP VESSEL MINI RED (MISCELLANEOUS) ×2 IMPLANT
NDL 18GX1X1/2 (RX/OR ONLY) (NEEDLE) IMPLANT
NEEDLE 18GX1X1/2 (RX/OR ONLY) (NEEDLE) ×3 IMPLANT
NS IRRIG 1000ML POUR BTL (IV SOLUTION) ×6 IMPLANT
PACK PERIPHERAL VASCULAR (CUSTOM PROCEDURE TRAY) ×3 IMPLANT
PAD ARMBOARD 7.5X6 YLW CONV (MISCELLANEOUS) ×6 IMPLANT
SPONGE LAP 18X18 RF (DISPOSABLE) ×2 IMPLANT
STOPCOCK 4 WAY LG BORE MALE ST (IV SETS) ×3 IMPLANT
SUT MNCRL AB 4-0 PS2 18 (SUTURE) ×10 IMPLANT
SUT PROLENE 5 0 C 1 24 (SUTURE) ×7 IMPLANT
SUT PROLENE 6 0 BV (SUTURE) ×15 IMPLANT
SUT SILK 2 0 SH (SUTURE) ×3 IMPLANT
SUT SILK 3 0 (SUTURE) ×3
SUT SILK 3-0 18XBRD TIE 12 (SUTURE) IMPLANT
SUT SILK 4 0 (SUTURE) ×3
SUT SILK 4-0 18XBRD TIE 12 (SUTURE) IMPLANT
SUT VIC AB 2-0 CT1 27 (SUTURE) ×12
SUT VIC AB 2-0 CT1 TAPERPNT 27 (SUTURE) ×2 IMPLANT
SUT VIC AB 3-0 SH 27 (SUTURE) ×15
SUT VIC AB 3-0 SH 27X BRD (SUTURE) ×2 IMPLANT
SYR 20CC LL (SYRINGE) ×2 IMPLANT
SYR 3ML LL SCALE MARK (SYRINGE) ×2 IMPLANT
TOWEL GREEN STERILE (TOWEL DISPOSABLE) ×3 IMPLANT
TRAY FOLEY MTR SLVR 16FR STAT (SET/KITS/TRAYS/PACK) ×3 IMPLANT
TUBING EXTENTION W/L.L. (IV SETS) ×3 IMPLANT
UNDERPAD 30X30 (UNDERPADS AND DIAPERS) ×3 IMPLANT
WATER STERILE IRR 1000ML POUR (IV SOLUTION) ×3 IMPLANT

## 2018-08-21 NOTE — Telephone Encounter (Signed)
-----   Message from Dara Lords, [place] sent at 08/21/2018 12:43 PM EDT ----- S/p left fem distal bypass 08/21/18.  F/u with Dr. Chestine Spore in 2-3 weeks.  Thanks

## 2018-08-21 NOTE — Anesthesia Procedure Notes (Signed)
Procedure Name: Intubation Date/Time: 08/21/2018 7:42 AM Performed by: Adria Dill, CRNA Pre-anesthesia Checklist: Patient identified, Emergency Drugs available, Suction available and Patient being monitored Patient Re-evaluated:Patient Re-evaluated prior to induction Oxygen Delivery Method: Circle system utilized Preoxygenation: Pre-oxygenation with 100% oxygen Induction Type: IV induction Ventilation: Mask ventilation without difficulty Laryngoscope Size: Miller and 3 Grade View: Grade I Tube type: Oral Tube size: 7.5 mm Number of attempts: 1 Airway Equipment and Method: Stylet Placement Confirmation: ETT inserted through vocal cords under direct vision,  positive ETCO2,  CO2 detector and breath sounds checked- equal and bilateral Secured at: 21 cm Tube secured with: Tape Dental Injury: Teeth and Oropharynx as per pre-operative assessment

## 2018-08-21 NOTE — Progress Notes (Signed)
08/21/2018 1420 Received pt to room 4E-22 from PACU.  Pt is S/P Fem/Tib Bypass.  A&O, some C/O pain in the L calf, there is a bruised area here, MD aware.  Tele monitor applied and CCMD notified.  CHG bath given.  Oriented to room, call light and bed.  Call bell in reach. Kathryne Hitch

## 2018-08-21 NOTE — Op Note (Signed)
Date: August 21, 2018  Preoperative diagnosis: Critical limb ischemia of the left lower extremity with rest pain  Postoperative diagnosis: Critical limb ischemia of the left lower extremity with rest pain  Procedure: 1.  Left common femoral artery to posterior tibial artery bypass with reversed ipsilateral great saphenous vein  Surgeon: Dr. Cephus Shelling, MD  Assistant: Doreatha Massed, PA  Indications: Patient is a 62 year old male who was seen in consultation initially for severe rest pain in his left lower extremity following coronary bypass.  We initially took him for aortogram with lower extremity arteriogram and he had severe multilevel disease including left external iliac stenosis, left SFA occlusion, and tibial disease with dominant posterior tibial runoff in the mid calf.  We initially stented his external iliac and when he was seen in follow-up he had minimal improvement in his symptoms.  Given a long SFA occlusion as well as severe occlusion of his tibial trifurcation we recommended a left common femoral to posterior tibial bypass with great saphenous vein.  He presents today after risks and benefits were discussed.  Findings: The left common femoral artery was moderately diseased but I did not feel an endarterectomy was necessary given that most of the plaque was on the posterior wall and there was no flow limiting stenosis on the arteriogram.  The left great saphenous vein was marginal below the knee and only about 2.5 mm but appeared adequate for suitable bypass.  The vein was reversed and sewn from the common femoral artery to the posterior tibial in the mid calf after tunneling.  The vein appeared to dilate nicely.  At the completion of the case the patient had a triphasic posterior tibial signal in the left foot.  Complications: None  Details: Patient was taken to the operating room after informed consent was obtained.  He was placed on operative table in supine position.   Prior to preparation of his left leg, I used ultrasound guidance and marked out his great saphenous vein along his left leg.  I also marked out his posterior tibial artery in the mid calf where he had evidence of flow by Doppler and this correlated with his arteriogram imaging.  His left groin and left leg were then prepped and draped in usual sterile fashion.  After preop timeout was performed to identify patient, procedure, and site we initially made a transverse incision above his left inguinal crease.  We dissected down through the subcutanoues tissue with Bovie cautery and ultimately dissected out the common femoral artery.  This was controlled proximally and distally with Vesseloops.  Through this incision we were able to identify the takeoff of the great saphenous vein and this was dissected free proximally with Metzenbaum scissors and Bovie cautery.  We then performed long longitudinal skip incisions down his left leg where his saphenous vein been marked.  Through these incisions we used Bovie cautery and blunt dissection and mobilized the saphenous vein in its entirety.  All branches were ligated with 4-0 silk ties proximally and small clips distally.  Once the vein was completely mobilized down to the ankle we then transected distally around the right ankle and then tied this off with a 2-0 silk.  In the groin we clamped the takeoff of the great saphenous with a right angle and this was oversewn with a 6-0 Prolene in running suture.  The vein was then reversed and tested and all remaining leaks were repaired with interrupted 6-0 Prolenes.  The vein appeared to dilate appropriately  and we felt that it was an adequate for bypass.  At this point in time we then went down through our saphenous vein skip incisions and dissected out the popliteal space by making an incision through the fascia a fingerbreadth posterior to the tibia.  I then went down to the mid calf and dissected out the posterior tibial artery.   I took down the gastrocnemius and soleus muscles and then identified the flexor digitorum under which the posterior tibial artery was identified.  The posterior tibial veins were dissected free and several small branches of the artery itself were controlled with small clips.  I then placed loose Vesseloops around the posterior tibial artery.  At this point in time we then tunneled from the popliteal space up to the groin with a large tunneler.  The patient was then given 10,000 units of IV heparin and ACTs were checked throughout the case to maintain them greater than 250.  We then spatulated the saphenous vein after it had been reversed and the proximal anastomosis to the common femoral artery was sewn with a running 5-0 Prolene.  Several small leaks were repaired with 6-0 Prolene.  The vein was then pressurized to ensure there were no additional leaks and had good pulsatile flow distally.  I then tunneled the vein in reversed fashion through the tunneler keeping the vein in proper orientation since it had been marked already.  I then after bringing the vein out through the popliteal space I then tunneled it through the subfascial space to the posterior tibial artery using a Kelly clamp.  The vein was then pulled to the appropriate tension and transected and spatulated.  The posterior tibial artery was then opened in longitudinal fashion with an 11 blade scalpel extended with Potts scissors.  The distal anastomosis was then sewn in running fashion with 6-0 Prolene.  Patient had a triphasic posterior tibial signal just distal to the distal anastomosis and also at the ankle.  This augmented with compression of the graft.  All wounds were then copiously irrigated with saline until the effluent was clear.  Protamine was given for reversal.  All the subcutaneous tissue was then closed in the lower leg with running 3-0 Vicryl and then 4 Monocryl in the skin and Dermabond was applied.  The groin incision was closed with a  deep 2-0 Vicryl then 3-0 Vicryl then 4 Monocryl in the skin.  Once we had closed all incisions and protamine administered we did confirm we had a robust posterior tibial signal in the left foot.  Taken to PACU in stable condition.  Anesthesia: General  Condition: Stable  Cephus Shelling, MD [facility] Office: [phone] Pager: [phone]  Cephus Shelling

## 2018-08-21 NOTE — H&P (Signed)
History and Physical Interval Note:  08/21/2018 7:18 AM  Robert Sanchez  has presented today for surgery, with the diagnosis of claudication  The various methods of treatment have been discussed with the patient and family. After consideration of risks, benefits and other options for treatment, the patient has consented to  Procedure(s): BYPASS GRAFT FEMORAL-POSTERIOR TIBIAL ARTERY (Left) as a surgical intervention .  The patient's history has been reviewed, patient examined, no change in status, stable for surgery.  I have reviewed the patient's chart and labs.  Questions were answered to the patient's satisfaction.     Left fem PT bypass  Cephus Shelling  Patient name: Robert Sanchez     MRN: [id number]        DOB: 1956-10-28          Sex: male  REASON FOR VISIT: Follow-up to discuss left fem distal bypass  HPI: Robert Sanchez is a 62 y.o. male with multiple medical comorbidities that presents for hospital follow-up after left external iliac stent for rest pain.  Patient had a CABG earlier in August and was seen as consultation in the hospital for rest pain in the left foot with an ABI 0.3.  He was discharged home and presented as an outpatient for his arteriogram.  He had multilevel disease and we were able to stent his external iliac with self expanding stent with minimal improvement.  Unfortunately he still had a long SFA occlusion as well as severe tibial disease with single-vessel PT runoff.  He states he cannot live with his symptoms.  He is ready to proceed with a bypass and understands the risks and benefits.      Past Medical History:  Diagnosis Date  . Acute pulmonary edema (HCC) 06/05/2018  . Arthritis   . Diabetes mellitus without complication (HCC)   . Diabetic neuropathy [facility])          Past Surgical History:  Procedure Laterality Date  . ABDOMINAL AORTOGRAM W/LOWER EXTREMITY N/A 06/21/2018   Procedure: ABDOMINAL AORTOGRAM W/LOWER EXTREMITY;  Surgeon:  Cephus Shelling, MD;  Location: [facility] INVASIVE CV LAB;  Service: Cardiovascular;  Laterality: N/A;  . CORONARY ARTERY BYPASS GRAFT N/A 06/12/2018   Procedure: CORONARY ARTERY BYPASS GRAFTING (CABG) x 3 WITH ENDOSCOPIC HARVESTING OF RIGHT GREATER SAPHENOUS VEIN. LIMA TO LAD. SVG TO PD. SVG TO DIAGONAL.;  Surgeon: Kerin Perna, MD;  Location: [facility] OR;  Service: Open Heart Surgery;  Laterality: N/A;  . KNEE ARTHROSCOPY    . PERIPHERAL VASCULAR INTERVENTION Left 06/21/2018   Procedure: PERIPHERAL VASCULAR INTERVENTION;  Surgeon: Cephus Shelling, MD;  Location: [facility] INVASIVE CV LAB;  Service: Cardiovascular;  Laterality: Left;  . RIGHT/LEFT HEART CATH AND CORONARY ANGIOGRAPHY N/A 06/05/2018   Procedure: RIGHT/LEFT HEART CATH AND CORONARY ANGIOGRAPHY;  Surgeon: Orpah Cobb, MD;  Location: [facility] INVASIVE CV LAB;  Service: Cardiovascular;  Laterality: N/A;  . SPINE SURGERY    . TEE WITHOUT CARDIOVERSION N/A 06/12/2018   Procedure: TRANSESOPHAGEAL ECHOCARDIOGRAM (TEE);  Surgeon: Donata Clay, Theron Arista, MD;  Location: [facility] OR;  Service: Open Heart Surgery;  Laterality: N/A;         Family History  Problem Relation Age of Onset  . Diabetes Mother   . Cancer Father   . Heart disease Father     SOCIAL HISTORY: Social History        Tobacco Use  . Smoking status: Former Smoker    Packs/day: 1.00    Years: 40.00  Pack years: 40.00    Last attempt to quit: 05/23/2018    Years since quitting: 0.1  . Smokeless tobacco: Never Used  Substance Use Topics  . Alcohol use: Not Currently         Allergies  Allergen Reactions  . Lisinopril Other (See Comments)    Syncope  . Codeine Rash          Current Outpatient Medications  Medication Sig Dispense Refill  . amiodarone (PACERONE) 400 MG tablet Take 1 tablet (400 mg total) by mouth 2 (two) times daily. For 7 days, then decrease to 400 mg daily (Patient taking differently: Take 400 mg by mouth daily. For 7 days, then  decrease to 400 mg daily) 60 tablet 1  . aspirin EC 325 MG EC tablet Take 1 tablet (325 mg total) by mouth daily. 30 tablet 0  . atorvastatin (LIPITOR) 40 MG tablet Take 1 tablet (40 mg total) by mouth daily at 6 PM. 30 tablet 3  . carvedilol (COREG) 12.5 MG tablet Take 1 tablet (12.5 mg total) by mouth 2 (two) times daily with a meal. 60 tablet 3  . clopidogrel (PLAVIX) 75 MG tablet Take 1 tablet (75 mg total) by mouth daily. 30 tablet 11  . furosemide (LASIX) 40 MG tablet Take 1 tablet (40 mg total) by mouth daily. 30 tablet 1  . glipiZIDE (GLUCOTROL) 5 MG tablet Take 1 tablet (5 mg total) by mouth daily before breakfast. 30 tablet 1  . metFORMIN (GLUCOPHAGE) 1000 MG tablet Take 1/2 tab in the morning and night for 1 week then increase to 1 tabs in the morning and night. 60 tablet 1  . potassium chloride SA (K-DUR,KLOR-CON) 20 MEQ tablet Take 1 tablet (20 mEq total) by mouth daily. 30 tablet 1   No current facility-administered medications for this visit.     REVIEW OF SYSTEMS:  [X]  denotes positive finding, [ ]  denotes negative finding Cardiac  Comments:  Chest pain or chest pressure:    Shortness of breath upon exertion:    Short of breath when lying flat:    Irregular heart rhythm:        Vascular    Pain in calf, thigh, or hip brought on by ambulation: x   Pain in feet at night that wakes you up from your sleep:  x   Blood clot in your veins:    Leg swelling:         Pulmonary    Oxygen at home:    Productive cough:     Wheezing:         Neurologic    Sudden weakness in arms or legs:     Sudden numbness in arms or legs:     Sudden onset of difficulty speaking or slurred speech:    Temporary loss of vision in one eye:     Problems with dizziness:         Gastrointestinal    Blood in stool:     Vomited blood:         Genitourinary    Burning when urinating:     Blood in urine:        Psychiatric     Major depression:         Hematologic    Bleeding problems:    Problems with blood clotting too easily:        Skin    Rashes or ulcers:        Constitutional  Fever or chills:      PHYSICAL EXAM:    Vitals:   07/25/18 1457  BP: (!) 156/83  Pulse: 72  Resp: 20  Temp: 97.6 F (36.4 C)  TempSrc: Oral  SpO2: 98%  Weight: 89.8 kg  Height: 5\' 8"  (1.727 m)    GENERAL: The patient is a well-nourished male, in no acute distress. The vital signs are documented above. CARDIAC: There is a regular rate and rhythm.  VASCULAR:  2+ femoral pulse bilateral groins. Left foot with no palpable pedal pulses.  He has monophasic dorsalis pedis and posterior tibial signals in the left foot. No tissue loss in left foot. PULMONARY: There is good air exchange bilaterally without wheezing or rales. MUSCULOSKELETAL: There are no major deformities or cyanosis. NEUROLOGIC: No focal weakness or paresthesias are detected.    DATA:   I independently reviewed his noninvasive imaging that shows a ABI of 0.43 in the left foot now compared to 0.63 in the right foot.  I independently reviewed his vein mapping which shows a good saphenous vein in the left leg that 2.6 mm in smallest diameter.  Assessment/Plan:  He has now been cleared by CT surgery following his CABG to proceed with lower extremity bypass.  Fortunately on vein mapping today he has a nice long saphenous vein in his left leg.  I recommended a left common femoral artery to posterior tibial bypass for critical limb ischemia with rest pain.  We will schedule for next available date.  He will need to hold his Plavix 5 days before the bypass.  Cephus Shelling, MD [facility] [place] Office: [phone] Pager: [phone]   Cephus Shelling     Electronically signed by Cephus Shelling, MD at 07/25/2018 5:58 PM Electronically signed by Cephus Shelling, MD at 07/25/2018 5:59 PM

## 2018-08-21 NOTE — Transfer of Care (Signed)
Immediate Anesthesia Transfer of Care Note  Patient: Robert Sanchez  Procedure(s) Performed: BYPASS GRAFT LEFT FEMORAL TO POSTERIOR TIBIAL ARTERY USING LEFT REVERSED GREAT SAPHENOUS VEIN (Left ) VEIN HARVEST LEFT GREAT SAPHENOUS VEIN (Left )  Patient Location: PACU  Anesthesia Type:General  Level of Consciousness: awake, alert , oriented and patient cooperative  Airway & Oxygen Therapy: Patient Spontanous Breathing and Patient connected to nasal cannula oxygen  Post-op Assessment: Report given to RN and Post -op Vital signs reviewed and stable  Post vital signs: Reviewed and stable  Last Vitals:  Vitals Value Taken Time  BP 154/84 08/21/2018 12:57 PM  Temp    Pulse 91 08/21/2018 12:59 PM  Resp 15 08/21/2018 12:59 PM  SpO2 93 % 08/21/2018 12:59 PM  Vitals shown include unvalidated device data.  Last Pain:  Vitals:   08/21/18 0652  TempSrc:   PainSc: 0-No pain      Patients Stated Pain Goal: 3 (08/21/18 1308)  Complications: No apparent anesthesia complications

## 2018-08-21 NOTE — Anesthesia Preprocedure Evaluation (Signed)
Anesthesia Evaluation  Patient identified by MRN, date of birth, ID band Patient awake    Reviewed: Allergy & Precautions, NPO status , Patient's Chart, lab work & pertinent test results, reviewed documented beta blocker date and time   History of Anesthesia Complications Negative for: history of anesthetic complications  Airway Mallampati: III  TM Distance: >3 FB Neck ROM: Full    Dental  (+) Missing, Edentulous Upper, Loose,    Pulmonary COPD, Current Smoker, former smoker,    breath sounds clear to auscultation       Cardiovascular hypertension, Pt. on home beta blockers (-) angina+ CAD, + Past MI, + CABG and +CHF   Rhythm:Regular Rate:Normal  ECG: NSR, rate 84  ECHO: LV EF: 20% -   25% Left ventricle: The cavity size was mildly dilated. There was mild concentric hypertrophy. Systolic function was severely reduced. The estimated ejection fraction was in the range of 20% to 25%. Diffuse hypokinesis. Doppler parameters are consistent with abnormal left ventricular relaxation (grade 1 diastolic dysfunction). Aortic valve: There was trivial regurgitation. Mitral valve: There was moderate regurgitation. Left atrium: The atrium was mildly dilated.  CATH: 1. Inferior wall infarction.  No evidence reversible ischemia. 2. LEFT ventricular dilatation and inferior wall hypokinesia. 3. Left ventricular ejection fraction 29% 4. Non invasive risk stratification*: High (predominately based on low ejection fraction).    Neuro/Psych Diabetic neuropathy  negative psych ROS   GI/Hepatic Neg liver ROS, GERD  ,  Endo/Other  diabetes, Type 2, Oral Hypoglycemic Agents  Renal/GU Renal InsufficiencyRenal disease     Musculoskeletal  (+) Arthritis ,   Abdominal   Peds  Hematology negative hematology ROS (+)   Anesthesia Other Findings   Reproductive/Obstetrics                             Anesthesia  Physical Anesthesia Plan  ASA: III  Anesthesia Plan: General   Post-op Pain Management:    Induction: Intravenous  PONV Risk Score and Plan: 2 and Ondansetron and Dexamethasone  Airway Management Planned: Oral ETT  Additional Equipment: None  Intra-op Plan:   Post-operative Plan: Extubation in OR  Informed Consent: I have reviewed the patients History and Physical, chart, labs and discussed the procedure including the risks, benefits and alternatives for the proposed anesthesia with the patient or authorized representative who has indicated his/her understanding and acceptance.   Dental advisory given  Plan Discussed with: CRNA and Surgeon  Anesthesia Plan Comments:         Anesthesia Quick Evaluation

## 2018-08-21 NOTE — Telephone Encounter (Signed)
sch appt lvm 09/19/18 1045am p/o MD

## 2018-08-21 NOTE — Progress Notes (Signed)
  Day of Surgery Note    Subjective:  No complaints in recovery room   Vitals:   08/21/18 1300 08/21/18 1312  BP: (!) 154/84 119/75  Pulse: 91 91  Resp: 15 15  Temp: (!) 97.3 F (36.3 C)   SpO2: 93% 93%    Incisions:   Left groin and other incisions are clean and dry.  There is a small hematoma in between the bridge and around the 2 distal incisions.  Extremities:  Excellent left DP/PT doppler signals Cardiac:  regular Lungs:  Non labored    Assessment/Plan:  This is a 62 y.o. male who is s/p  Left common femoral artery to posterior tibial artery bypass with reversed ipsilateral great saphenous vein  -pt doing well in recovery with excellent left DP/PT doppler signals -he does have a small hematoma around the two distal incisions that has not changed since arriving to pacu per RN.  Will continue to monitor and Dr. Chestine Spore will also check on the pt.    Doreatha Massed, PA-C 08/21/2018 1:25 PM [phone]

## 2018-08-22 ENCOUNTER — Encounter (HOSPITAL_COMMUNITY): Payer: Self-pay | Admitting: Vascular Surgery

## 2018-08-22 ENCOUNTER — Inpatient Hospital Stay (HOSPITAL_COMMUNITY): Payer: Self-pay

## 2018-08-22 DIAGNOSIS — Z9889 Other specified postprocedural states: Secondary | ICD-10-CM

## 2018-08-22 LAB — BASIC METABOLIC PANEL
Anion gap: 10 (ref 5–15)
BUN: 26 mg/dL — ABNORMAL HIGH (ref 6–20)
CHLORIDE: 102 mmol/L (ref 98–111)
CO2: 23 mmol/L (ref 22–32)
CREATININE: 1.68 mg/dL — AB (ref 0.61–1.24)
Calcium: 8.7 mg/dL — ABNORMAL LOW (ref 8.9–10.3)
GFR calc non Af Amer: 43 mL/min — ABNORMAL LOW (ref >60)
GFR, EST AFRICAN AMERICAN: 49 mL/min — AB (ref >60)
Glucose, Bld: 193 mg/dL — ABNORMAL HIGH (ref 70–99)
Potassium: 3.9 mmol/L (ref 3.5–5.1)
SODIUM: 135 mmol/L (ref 135–145)

## 2018-08-22 LAB — CBC
HCT: 30 % — ABNORMAL LOW (ref 39.0–52.0)
HEMOGLOBIN: 9.4 g/dL — AB (ref 13.0–17.0)
MCH: 28.4 pg (ref 26.0–34.0)
MCHC: 31.3 g/dL (ref 30.0–36.0)
MCV: 90.6 fL (ref 80.0–100.0)
Platelets: 364 10*3/uL (ref 150–400)
RBC: 3.31 MIL/uL — AB (ref 4.22–5.81)
RDW: 14.9 % (ref 11.5–15.5)
WBC: 12.1 10*3/uL — ABNORMAL HIGH (ref 4.0–10.5)
nRBC: 0 % (ref 0.0–0.2)

## 2018-08-22 LAB — GLUCOSE, CAPILLARY
GLUCOSE-CAPILLARY: 168 mg/dL — AB (ref 70–99)
Glucose-Capillary: 149 mg/dL — ABNORMAL HIGH (ref 70–99)
Glucose-Capillary: 152 mg/dL — ABNORMAL HIGH (ref 70–99)
Glucose-Capillary: 146 mg/dL — ABNORMAL HIGH (ref 70–99)

## 2018-08-22 NOTE — Anesthesia Postprocedure Evaluation (Signed)
Anesthesia Post Note  Patient: Robert Sanchez  Procedure(s) Performed: BYPASS GRAFT LEFT FEMORAL TO POSTERIOR TIBIAL ARTERY USING LEFT REVERSED GREAT SAPHENOUS VEIN (Left ) VEIN HARVEST LEFT GREAT SAPHENOUS VEIN (Left )     Patient location during evaluation: PACU Anesthesia Type: General Level of consciousness: awake and confused Pain management: pain level controlled Vital Signs Assessment: post-procedure vital signs reviewed and stable Respiratory status: spontaneous breathing, nonlabored ventilation, respiratory function stable and patient connected to nasal cannula oxygen Cardiovascular status: blood pressure returned to baseline and stable Postop Assessment: no apparent nausea or vomiting Anesthetic complications: no    Last Vitals:  Vitals:   08/22/18 1407 08/22/18 1632  BP: (!) 96/58 (!) 91/55  Pulse:    Resp:    Temp: 36.6 C   SpO2:      Last Pain:  Vitals:   08/22/18 1505  TempSrc:   PainSc: 5                  Jona Erkkila

## 2018-08-22 NOTE — Progress Notes (Signed)
VASCULAR LAB PRELIMINARY  ARTERIAL  ABI completed:  Right ABIs and TBIs appear decreased compared to prior study on 07/18/2018. Left ABI and TBI appear increased compared to prior study on 07/18/2018.   Right: Resting right ankle-brachial index indicates moderate right lower extremity arterial disease. The right toe-brachial index is abnormal.   Left: The left toe-brachial index is abnormal.   Left ABI is unable to obtain due to the location of surgical incision.     RIGHT    LEFT    PRESSURE WAVEFORM  PRESSURE WAVEFORM  BRACHIAL 124 triphasic BRACHIAL N/A Triphasic  DP 68 monophasic DP N/A Monophasic  PT 48 monophasic PT N/A Monophasic  GREAT TOE 45 dampened GREAT TOE 56     RIGHT LEFT  ABI/TBI 0.55/0.36 TBI 0.45     Robert  Lacosta Sanchez, RVT 08/22/2018, 11:27 AM

## 2018-08-22 NOTE — Progress Notes (Addendum)
  Progress Note    08/22/2018 7:10 AM 1 Day Post-Op  Subjective:  No complaints; says the sooner they get him up, the sooner he can go home  Afebrile HR 60's-70's NSR 90's-110's systolic 91-97% RA  Vitals:   11/91/47 0000 08/22/18 0554  BP: (!) 116/57 (!) 107/58  Pulse: 65 62  Resp: 13 16  Temp: 98.1 F (36.7 C) 97.8 F (36.6 C)  SpO2: 94% 96%    Physical Exam: Cardiac:  regular Lungs:  Non labored Incisions:  All are clean and dry Extremities:  Brisk doppler signal left PT doppler signal; small hematoma between distal incisions improved today   CBC    Component Value Date/Time   WBC 12.1 (H) 08/22/2018 0404   RBC 3.31 (L) 08/22/2018 0404   HGB 9.4 (L) 08/22/2018 0404   HCT 30.0 (L) 08/22/2018 0404   PLT 364 08/22/2018 0404   MCV 90.6 08/22/2018 0404   MCH 28.4 08/22/2018 0404   MCHC 31.3 08/22/2018 0404   RDW 14.9 08/22/2018 0404   LYMPHSABS 1.9 06/22/2018 1912   MONOABS 1.1 (H) 06/22/2018 1912   EOSABS 0.3 06/22/2018 1912   BASOSABS 0.1 06/22/2018 1912    BMET    Component Value Date/Time   NA 135 08/22/2018 0404   NA 135 08/11/2018 1449   K 3.9 08/22/2018 0404   CL 102 08/22/2018 0404   CO2 23 08/22/2018 0404   GLUCOSE 193 (H) 08/22/2018 0404   BUN 26 (H) 08/22/2018 0404   BUN 14 08/11/2018 1449   CREATININE 1.68 (H) 08/22/2018 0404   CREATININE 0.81 08/14/2014 1126   CALCIUM 8.7 (L) 08/22/2018 0404   GFRNONAA 43 (L) 08/22/2018 0404   GFRNONAA >89 08/14/2014 1126   GFRAA 49 (L) 08/22/2018 0404   GFRAA >89 08/14/2014 1126    INR    Component Value Date/Time   INR 1.19 08/17/2018 1359     Intake/Output Summary (Last 24 hours) at 08/22/2018 0710 Last data filed at 08/22/2018 0559 Gross per 24 hour  Intake 2786.88 ml  Output 2420 ml  Net 366.88 ml     Assessment:  62 y.o. male is s/p:  Left common femoral artery to posterior tibial artery bypass with reversed ipsilateral great saphenous vein  1 Day Post-Op  Plan: -excellent  left PT doppler signal -foley removed this morning -pt needs to be out of bed ambulating today -DVT prophylaxis:  Sq heparin to start today   Doreatha Massed, PA-C [facility] [phone] 08/22/2018 7:10 AM   I have seen and evaluated the patient. I agree with the PA note as documented above. Doing well this am.  Palpable PT pulse in left foot.  OOB to chair today.  Wean narcotics.  Groin and incisions look good.   Cephus Shelling, MD [facility] [place] Office: [phone] Pager: [phone]

## 2018-08-22 NOTE — Evaluation (Signed)
Physical Therapy Evaluation Patient Details Name: Robert Sanchez MRN: [id number] DOB: 14-Jul-1956 Today's Date: 08/22/2018   History of Present Illness  62yo male presenting with recent history of CABG August 2019 and ABI of 0.3 in his L leg, now presenting for L femoral bypass procedure. He received vascular bypass L LE on 08/21/18. PMH pulmonary edema, OA, DM, neuropathy, CABG, knee scope, hx spine surgery, cardiac cath   Clinical Impression   Patient received in bed, very pleasant and willing to work with PT today. He requires ModA to complete functional bed mobility however was able to perform functional transfers and gait approximately 39f with RW and close min guard. Gait distance limited by pain L LE today, and patient has difficulty in bearing weight down through L LE during all mobility this morning due to pain. He was left up in the chair with all needs met and education completed, all questions/concerns addressed. He will continue to benefit from skilled PT services in the acute setting as well as skilled HHPT services moving forward.     Follow Up Recommendations Home health PT    Equipment Recommendations  Rolling walker with 5" wheels;3in1 (PT)    Recommendations for Other Services       Precautions / Restrictions Precautions Precautions: Fall;Other (comment) Precaution Comments: CABG August 2019  Restrictions Weight Bearing Restrictions: No      Mobility  Bed Mobility Overal bed mobility: Needs Assistance Bed Mobility: Supine to Sit     Supine to sit: Mod assist     General bed mobility comments: ModA to bring L LE around and to pivot hips to EOB   Transfers Overall transfer level: Needs assistance Equipment used: Rolling walker (2 wheeled) Transfers: Sit to/from Stand Sit to Stand: Min guard         General transfer comment: Min guard and cues for safety and sequencing   Ambulation/Gait Ambulation/Gait assistance: Min guard Gait Distance (Feet):  15 Feet Assistive device: Rolling walker (2 wheeled) Gait Pattern/deviations: Step-to pattern;Decreased step length - right;Decreased stance time - left;Decreased stride length;Decreased weight shift to left;Antalgic;Trunk flexed Gait velocity: decreased    General Gait Details: difficulty bearing weight down through L LE due to pain, performed step to pattern with RW due to pain, gait distance limited due to pain L LE this morning   Stairs            Wheelchair Mobility    Modified Rankin (Stroke Patients Only)       Balance Overall balance assessment: Mild deficits observed, not formally tested                                           Pertinent Vitals/Pain Pain Assessment: 0-10 Pain Score: 4  Pain Location: L LE  Pain Descriptors / Indicators: Sore;Burning Pain Intervention(s): Limited activity within patient's tolerance;Monitored during session    Home Living Family/patient expects to be discharged to:: Private residence Living Arrangements: Alone Available Help at Discharge: Family;Friend(s);Available PRN/intermittently Type of Home: House Home Access: Stairs to enter Entrance Stairs-Rails: None Entrance Stairs-Number of Steps: 2 Home Layout: One level Home Equipment: None      Prior Function Level of Independence: Independent         Comments: Works as pAdministrator       Extremity/Trunk Assessment   Upper Extremity Assessment Upper Extremity Assessment:  Defer to OT evaluation    Lower Extremity Assessment Lower Extremity Assessment: Overall WFL for tasks assessed    Cervical / Trunk Assessment Cervical / Trunk Assessment: Normal  Communication   Communication: No difficulties  Cognition Arousal/Alertness: Awake/alert Behavior During Therapy: WFL for tasks assessed/performed Overall Cognitive Status: Within Functional Limits for tasks assessed                                         General Comments      Exercises     Assessment/Plan    PT Assessment Patient needs continued PT services  PT Problem List Decreased activity tolerance;Decreased safety awareness;Decreased balance;Pain;Decreased mobility;Decreased coordination       PT Treatment Interventions DME instruction;Balance training;Gait training;Neuromuscular re-education;Stair training;Functional mobility training;Patient/family education;Therapeutic activities;Therapeutic exercise;Manual techniques    PT Goals (Current goals can be found in the Care Plan section)  Acute Rehab PT Goals Patient Stated Goal: to go home, get back to work  PT Goal Formulation: With patient Time For Goal Achievement: 09/05/18 Potential to Achieve Goals: Good    Frequency Min 3X/week   Barriers to discharge        Co-evaluation               AM-PAC PT "6 Clicks" Daily Activity  Outcome Measure Difficulty turning over in bed (including adjusting bedclothes, sheets and blankets)?: Unable Difficulty moving from lying on back to sitting on the side of the bed? : Unable Difficulty sitting down on and standing up from a chair with arms (e.g., wheelchair, bedside commode, etc,.)?: Unable Help needed moving to and from a bed to chair (including a wheelchair)?: A Little Help needed walking in hospital room?: A Little Help needed climbing 3-5 steps with a railing? : A Lot 6 Click Score: 11    End of Session Equipment Utilized During Treatment: Gait belt Activity Tolerance: Patient tolerated treatment well Patient left: in chair;with call bell/phone within reach   PT Visit Diagnosis: Difficulty in walking, not elsewhere classified (R26.2)    Time: 5498-2641 PT Time Calculation (min) (ACUTE ONLY): 27 min   Charges:   PT Evaluation $PT Eval Moderate Complexity: 1 Mod PT Treatments $Gait Training: 8-22 mins        Deniece Ree PT, DPT, CBIS  Supplemental Physical Therapist [place]    Pager  [phone] Acute Rehab Office [phone]

## 2018-08-22 NOTE — Evaluation (Signed)
Occupational Therapy Evaluation Patient Details Name: Robert Sanchez MRN: [id number] DOB: [birth year] Today's Date: 08/22/2018    History of Present Illness 62yo male presenting with recent history of CABG August 2019 and ABI of 0.3 in his L leg, now presenting for L femoral bypass procedure. He received vascular bypass L LE on 08/21/18. PMH pulmonary edema, OA, DM, neuropathy, CABG, knee scope, hx spine surgery, cardiac cath    Clinical Impression   Pt admitted as above presenting with problems as listed below (please refer to OT problem list) whom should benefit from acute OT to assist in maximizing independence with ADL's and functional transfers related to ADL's prior to anticipated d/c home.    Follow Up Recommendations  No OT follow up;Supervision - Intermittent    Equipment Recommendations  3 in 1 bedside commode(Rec 3:1 for home, can be used in tub as well)    Recommendations for Other Services       Precautions / Restrictions Precautions Precautions: Fall;Other (comment) Precaution Comments: CABG August 2019  Restrictions Weight Bearing Restrictions: No      Mobility Bed Mobility Overal bed mobility: Needs Assistance Bed Mobility: Sit to Supine     Supine to sit: Mod assist Sit to supine: Min assist   General bed mobility comments: Min A LLE, pt used rails and RLE to scoot up to [facility].  Transfers Overall transfer level: Needs assistance Equipment used: Rolling walker (2 wheeled) Transfers: Sit to/from [employer] Sit to Stand: Min guard Stand pivot transfers: Min guard       General transfer comment: Min guard and cues for safety and sequencing with RW    Balance Overall balance assessment: Mild deficits observed, not formally tested                                         ADL either performed or assessed with clinical judgement   ADL Overall ADL's : Needs assistance/impaired Eating/Feeding: Set up;Bed  level;Sitting   Grooming: Wash/dry hands;Set up;Sitting   Upper Body Bathing: Set up;Sitting   Lower Body Bathing: Moderate assistance;Sit to/from stand   Upper Body Dressing : Set up;Sitting   Lower Body Dressing: Moderate assistance;Sit to/from stand   Toilet Transfer: RW;Minimal assistance;BSC;Ambulation   Toileting- Architect and Hygiene: Minimal assistance;Sit to/from stand(simulated transfer from recliner chair sit to stand and then chair to EOB)       Functional mobility during ADLs: Minimal assistance;Rolling walker;Cueing for safety;Cueing for sequencing General ADL Comments: Pt seen for OT Assessment currently demonstrating deficits in his ability to perform ADL's and functional mobility/transfers related to ADL's. Pt was educated in role of OT and plan of care. Pt lives alone and should benefit from acute OT to assist in pt ed and maxamizing independence with ADL/transfers. Will follow acutely.     Vision Baseline Vision/History: Wears glasses Wears Glasses: Reading only Patient Visual Report: No change from baseline Vision Assessment?: No apparent visual deficits     Perception     Praxis      Pertinent Vitals/Pain Pain Assessment: 0-10 Pain Score: 7  Pain Location: L LE  Pain Descriptors / Indicators: Sore;Burning Pain Intervention(s): Limited activity within patient's tolerance;Monitored during session;Patient requesting pain meds-RN notified     Hand Dominance Right   Extremity/Trunk Assessment Upper Extremity Assessment Upper Extremity Assessment: Overall WFL for tasks assessed   Lower Extremity Assessment Lower Extremity Assessment: Overall  WFL for tasks assessed   Cervical / Trunk Assessment Cervical / Trunk Assessment: Normal   Communication Communication Communication: No difficulties   Cognition Arousal/Alertness: Awake/alert Behavior During Therapy: WFL for tasks assessed/performed Overall Cognitive Status: Within Functional  Limits for tasks assessed                                     General Comments       Exercises     Shoulder Instructions      Home Living Family/patient expects to be discharged to:: Private residence Living Arrangements: Alone Available Help at Discharge: Family;Friend(s);Available PRN/intermittently Type of Home: House Home Access: Stairs to enter [employer] of Steps: 2 Entrance Stairs-Rails: None Home Layout: One level     Bathroom Shower/Tub: Chief Strategy Officer: Standard     Home Equipment: None          Prior Functioning/Environment Level of Independence: Independent        Comments: Works as Programme researcher, broadcasting/film/video Problem List: Decreased activity tolerance;Decreased knowledge of use of DME or AE;Pain;Decreased knowledge of precautions      OT Treatment/Interventions: Self-care/ADL training;DME and/or AE instruction;Therapeutic activities;Patient/family education    OT Goals(Current goals can be found in the care plan section) Acute Rehab OT Goals Patient Stated Goal: to go home, get back to work  OT Goal Formulation: With patient Time For Goal Achievement: 09/05/18 Potential to Achieve Goals: Good  OT Frequency: Min 2X/week   Barriers to D/C: Other (comment)  Lives alone       Co-evaluation              AM-PAC PT "6 Clicks" Daily Activity     Outcome Measure Help from another person eating meals?: None Help from another person taking care of personal grooming?: A Little Help from another person toileting, which includes using toliet, bedpan, or urinal?: A Lot Help from another person bathing (including washing, rinsing, drying)?: A Lot Help from another person to put on and taking off regular upper body clothing?: None Help from another person to put on and taking off regular lower body clothing?: A Little 6 Click Score: 18   End of Session Equipment Utilized During Treatment: Gait belt;Rolling  walker;Other (comment)(BP/Heart monitor/Lines) Nurse Communication: Mobility status;Patient requests pain meds  Activity Tolerance: Patient tolerated treatment well;Patient limited by pain Patient left: in bed;with call bell/phone within reach;Other (comment)(Transport came to get pt to take to ultrasound)  OT Visit Diagnosis: Unsteadiness on feet (R26.81);Pain Pain - Right/Left: Left Pain - part of body: Leg                Time: 1610-9604 OT Time Calculation (min): 20 min Charges:  OT General Charges $OT Visit: 1 Visit OT Evaluation $OT Eval Moderate Complexity: 1 Mod   Dalyn Kjos Beth Dixon, OTR/L 08/22/2018, 10:54 AM

## 2018-08-23 LAB — GLUCOSE, CAPILLARY
GLUCOSE-CAPILLARY: 185 mg/dL — AB (ref 70–99)
Glucose-Capillary: 114 mg/dL — ABNORMAL HIGH (ref 70–99)
Glucose-Capillary: 96 mg/dL (ref 70–99)
Glucose-Capillary: 155 mg/dL — ABNORMAL HIGH (ref 70–99)

## 2018-08-23 NOTE — Progress Notes (Signed)
Occupational Therapy Treatment Patient Details Name: Robert Sanchez MRN: [id number] DOB: [birth year] Today's Date: 08/23/2018    History of present illness 62yo male presenting with recent history of CABG August 2019 and ABI of 0.3 in his L leg, now presenting for L femoral bypass procedure. He received vascular bypass L LE on 08/21/18. PMH pulmonary edema, OA, DM, neuropathy, CABG, knee scope, hx spine surgery, cardiac cath    OT comments  Pt performing toileting, standing grooming and donning underwear (starting with L LE) with supervision. Issued sock aid and instructed in use. Educated in tub transfer with RW and use of 3 in 1 as tub seat.  Follow Up Recommendations  No OT follow up;Supervision - Intermittent    Equipment Recommendations  3 in 1 bedside commode    Recommendations for Other Services      Precautions / Restrictions Precautions Precautions: Fall;Other (comment) Precaution Comments: CABG August 2019        Mobility Bed Mobility               General bed mobility comments: pt in bathroom upon arrival  Transfers Overall transfer level: Needs assistance Equipment used: Rolling walker (2 wheeled) Transfers: Sit to/from Stand Sit to Stand: Supervision         General transfer comment: for safety    Balance                                           ADL either performed or assessed with clinical judgement   ADL Overall ADL's : Needs assistance/impaired     Grooming: Wash/dry hands;Standing;Supervision/safety               Lower Body Dressing: Minimal assistance;Sit to/from stand Lower Body Dressing Details (indicate cue type and reason): issued sock aide, instructed to dress L LE first Toilet Transfer: Ambulation;RW;Supervision/safety   Toileting- Clothing Manipulation and Hygiene: Supervision/safety;Sit to/from stand   Tub/ Shower Transfer: Tub Lexicographer Details (indicate  cue type and reason): educated pt that 3 in 1 can be used for tub seat, educated in technique with RW Functional mobility during ADLs: Supervision/safety;Rolling walker       Vision       Perception     Praxis      Cognition Arousal/Alertness: Awake/alert Behavior During Therapy: WFL for tasks assessed/performed Overall Cognitive Status: Within Functional Limits for tasks assessed                                          Exercises     Shoulder Instructions       General Comments      Pertinent Vitals/ Pain       Pain Assessment: Faces Faces Pain Scale: Hurts little more Pain Location: L LE  Pain Descriptors / Indicators: Sore Pain Intervention(s): Monitored during session  Home Living                                          Prior Functioning/Environment              Frequency  Min 2X/week        Progress Toward Goals  OT Goals(current goals  can now be found in the care plan section)  Progress towards OT goals: Progressing toward goals  Acute Rehab OT Goals Patient Stated Goal: to go home, get back to work  OT Goal Formulation: With patient Time For Goal Achievement: 09/05/18 Potential to Achieve Goals: Good  Plan Discharge plan remains appropriate    Co-evaluation                 AM-PAC PT "6 Clicks" Daily Activity     Outcome Measure   Help from another person eating meals?: None Help from another person taking care of personal grooming?: A Little Help from another person toileting, which includes using toliet, bedpan, or urinal?: A Little Help from another person bathing (including washing, rinsing, drying)?: A Little Help from another person to put on and taking off regular upper body clothing?: None Help from another person to put on and taking off regular lower body clothing?: A Little 6 Click Score: 20    End of Session Equipment Utilized During Treatment: Gait belt;Rolling walker  OT  Visit Diagnosis: Unsteadiness on feet (R26.81);Pain Pain - Right/Left: Left Pain - part of body: Leg   Activity Tolerance Patient tolerated treatment well   Patient Left in chair;with call bell/phone within reach   Nurse Communication          Time: 1610-9604 OT Time Calculation (min): 21 min  Charges: OT General Charges $OT Visit: 1 Visit OT Treatments $Self Care/Home Management : 8-22 mins  Martie Round, OTR/L Acute Rehabilitation Services Pager: [phone] Office: [phone]   Evern Bio 08/23/2018, 2:56 PM

## 2018-08-23 NOTE — Progress Notes (Signed)
Physical Therapy Treatment Patient Details Name: Robert Sanchez MRN: [id number] DOB: February 18, 1956 Today's Date: 08/23/2018    History of Present Illness 62yo male presenting with recent history of CABG August 2019 and ABI of 0.3 in his L leg, now presenting for L femoral bypass procedure. He received vascular bypass L LE on 08/21/18. PMH pulmonary edema, OA, DM, neuropathy, CABG, knee scope, hx spine surgery, cardiac cath     PT Comments    Patient is progressing very well towards their physical therapy goals.Session focused on gait training and lower extremity strengthening exercises.   Verbalizing improved pain tolerance/control this session and increased ambulation distance to 200 feet using walker and supervision. D/c plan remains appropriate.     Follow Up Recommendations  Home health PT     Equipment Recommendations  Rolling walker with 5" wheels;3in1 (PT)    Recommendations for Other Services       Precautions / Restrictions Precautions Precautions: Fall;Other (comment) Precaution Comments: CABG August 2019  Restrictions Weight Bearing Restrictions: No    Mobility  Bed Mobility Overal bed mobility: Modified Independent             General bed mobility comments: pt in bathroom upon arrival  Transfers Overall transfer level: Needs assistance Equipment used: Rolling walker (2 wheeled) Transfers: Sit to/from Stand Sit to Stand: Supervision         General transfer comment: for safety  Ambulation/Gait Ambulation/Gait assistance: Supervision Gait Distance (Feet): 200 Feet Assistive device: Rolling walker (2 wheeled) Gait Pattern/deviations: Step-to pattern;Decreased step length - right;Decreased stance time - left;Decreased stride length;Decreased weight shift to left;Antalgic;Step-through pattern;Narrow base of support Gait velocity: decreased    General Gait Details: Patient able to progressively increase weightbearing through LLE. Cues for decreased  left step length, decreased left foot external rotation (maintaining neutral), upright posture, looking up, and step through pattern. Pt utilizing slightly faster gait speed towards end of walk.   Stairs             Wheelchair Mobility    Modified Rankin (Stroke Patients Only)       Balance Overall balance assessment: Mild deficits observed, not formally tested                                          Cognition Arousal/Alertness: Awake/alert Behavior During Therapy: WFL for tasks assessed/performed Overall Cognitive Status: Within Functional Limits for tasks assessed                                        Exercises General Exercises - Lower Extremity Quad Sets: 20 reps;Both;Supine Heel Slides: 10 reps;Left;Supine Hip ABduction/ADduction: 10 reps;Left;Supine    General Comments        Pertinent Vitals/Pain Pain Assessment: Faces Faces Pain Scale: Hurts a little bit Pain Location: L LE  Pain Descriptors / Indicators: Sore Pain Intervention(s): Monitored during session    Home Living                      Prior Function            PT Goals (current goals can now be found in the care plan section) Acute Rehab PT Goals Patient Stated Goal: to go home, get back to work  Potential to Achieve Goals: Good Progress  towards PT goals: Progressing toward goals    Frequency    Min 3X/week      PT Plan Current plan remains appropriate    Co-evaluation              AM-PAC PT "6 Clicks" Daily Activity  Outcome Measure  Difficulty turning over in bed (including adjusting bedclothes, sheets and blankets)?: A Little Difficulty moving from lying on back to sitting on the side of the bed? : A Little Difficulty sitting down on and standing up from a chair with arms (e.g., wheelchair, bedside commode, etc,.)?: A Little Help needed moving to and from a bed to chair (including a wheelchair)?: A Little Help needed  walking in hospital room?: A Little Help needed climbing 3-5 steps with a railing? : A Little 6 Click Score: 18    End of Session Equipment Utilized During Treatment: Gait belt Activity Tolerance: Patient tolerated treatment well Patient left: Other (comment)(handoff to OT)   PT Visit Diagnosis: Difficulty in walking, not elsewhere classified (R26.2)     Time: 7829-5621 PT Time Calculation (min) (ACUTE ONLY): 15 min  Charges:  $Gait Training: 8-22 mins                     Laurina Bustle, [place], DPT Acute Rehabilitation Services Pager [phone] Office [phone]    Vanetta Mulders 08/23/2018, 3:13 PM

## 2018-08-23 NOTE — Progress Notes (Addendum)
Progress Note    08/23/2018 7:12 AM 2 Days Post-Op  Subjective:  C/o leg soreness.  Says he got up to the bathroom this morning and got back in bed and it was a little better.  Feels like he is getting more blood to his foot.   Afebrile HR 70's-80's NSR 90's-110's systolic 95% RA  Vitals:   08/23/18 0000 08/23/18 0300  BP: (!) 102/54 119/60  Pulse: 76 80  Resp: 15 14  Temp:    SpO2: 92% 92%    Physical Exam: Cardiac:  regular Lungs:  Non labored Incisions:  All are healing nicely Extremities:  Easily palpable left PT pulse; calf and anterior compartments are soft.   CBC    Component Value Date/Time   WBC 12.1 (H) 08/22/2018 0404   RBC 3.31 (L) 08/22/2018 0404   HGB 9.4 (L) 08/22/2018 0404   HCT 30.0 (L) 08/22/2018 0404   PLT 364 08/22/2018 0404   MCV 90.6 08/22/2018 0404   MCH 28.4 08/22/2018 0404   MCHC 31.3 08/22/2018 0404   RDW 14.9 08/22/2018 0404   LYMPHSABS 1.9 06/22/2018 1912   MONOABS 1.1 (H) 06/22/2018 1912   EOSABS 0.3 06/22/2018 1912   BASOSABS 0.1 06/22/2018 1912    BMET    Component Value Date/Time   NA 135 08/22/2018 0404   NA 135 08/11/2018 1449   K 3.9 08/22/2018 0404   CL 102 08/22/2018 0404   CO2 23 08/22/2018 0404   GLUCOSE 193 (H) 08/22/2018 0404   BUN 26 (H) 08/22/2018 0404   BUN 14 08/11/2018 1449   CREATININE 1.68 (H) 08/22/2018 0404   CREATININE 0.81 08/14/2014 1126   CALCIUM 8.7 (L) 08/22/2018 0404   GFRNONAA 43 (L) 08/22/2018 0404   GFRNONAA >89 08/14/2014 1126   GFRAA 49 (L) 08/22/2018 0404   GFRAA >89 08/14/2014 1126    INR    Component Value Date/Time   INR 1.19 08/17/2018 1359     Intake/Output Summary (Last 24 hours) at 08/23/2018 1610 Last data filed at 08/23/2018 0000 Gross per 24 hour  Intake 542.23 ml  Output 775 ml  Net -232.77 ml   ABI's 08/22/18:  RIGHT    LEFT    PRESSURE WAVEFORM  PRESSURE WAVEFORM  BRACHIAL 124 triphasic BRACHIAL N/A Triphasic  DP 68 monophasic DP N/A Monophasic    PT 48 monophasic PT N/A Monophasic  GREAT TOE 45 dampened GREAT TOE 56     RIGHT LEFT  ABI/TBI 0.55/0.36 TBI 0.45     Assessment:  62 y.o. male is s/p:  Left common femoral arteryto posterior tibial artery bypass with reversed ipsilateral great saphenous vein  2 Days Post-Op  Plan: -pt continues to have patent bypass graft with easily palpable left PT pulse.  By  Doppler, flow is brisk and waves forms sound biphasic.  Left ABI unable to obtain due to incision. -creatinine elevated yesterday at 1.68, which was unchanged from August.  May need some IVF.  Will hold re-starting Metformin -continues to have soreness throughout the leg; left calf and anterior compartments are soft.   -DVT prophylaxis:  Sq heparin -pt needs to continue to mobilize.  -continue plavix/asa/statin   Doreatha Massed, PA-C [facility] [phone] 08/23/2018 7:12 AM  I have seen and evaluated the patient. I agree with the PA note as documented above. Palpable left PT.  Foot nice and warm.  Needs one more day for pain control.  Walked with PT yesterday.  D/C home tomorrow hopefully.  Incisions look great.  Cephus Shelling, MD [facility] [place] Office: [phone] Pager: [phone]  Cephus Shelling

## 2018-08-24 LAB — GLUCOSE, CAPILLARY: Glucose-Capillary: 139 mg/dL — ABNORMAL HIGH (ref 70–99)

## 2018-08-24 MED ORDER — OXYCODONE-ACETAMINOPHEN 5-325 MG PO TABS: 1.0000 | ORAL_TABLET | Freq: Four times a day (QID) | ORAL | 0 refills | Status: DC | PRN

## 2018-08-24 NOTE — Discharge Summary (Signed)
Discharge Summary     Robert Sanchez 1956/02/16 62 y.o. male  [id number]  Admission Date: 08/21/2018  Discharge Date: 08/24/18  Physician: Cephus Shelling, MD  Admission Diagnosis: claudication   HPI:   This is a 62 y.o. male with multiple medical comorbidities that presents for hospital follow-up after left external iliac stent for rest pain. Patient had a CABG earlier in August and was seen as consultation in the hospital for rest pain in the left foot with an ABI 0.3. He was discharged home and presented as an outpatient for his arteriogram. He had multilevel disease and we wereable to stent his external iliac with self expanding stent with minimal improvement. Unfortunately he still had a long SFA occlusion as well as severe tibial disease with single-vessel PTrunoff.He states he cannot live with his symptoms.He is ready to proceed with a bypass and understands the risks and benefits.  Hospital Course:  The patient was admitted to the hospital and taken to the operating room on 08/21/2018 and underwent: Left common femoral artery to posterior tibial artery bypass with reversed ipsilateral great saphenous vein    Findings: The left common femoral artery was moderately diseased but I did not feel an endarterectomy was necessary given that most of the plaque was on the posterior wall and there was no flow limiting stenosis on the arteriogram.  The left great saphenous vein was marginal below the knee and only about 2.5 mm but appeared adequate for suitable bypass.  The vein was reversed and sewn from the common femoral artery to the posterior tibial in the mid calf after tunneling.  The vein appeared to dilate nicely.  At the completion of the case the patient had a triphasic posterior tibial signal in the left foot.  The pt tolerated the procedure well and was transported to the PACU in good condition.   In recovery room, he had excellent left DP/PT doppler  signals.  He did have a small hematoma between the two distal incisions that had not changed upon arrival to pacu.  This did improve over the hospitalization.    By POD 1, the pt had an excellent left PT doppler signal and was also palpable..  Foley was removed.   ABI's on 08/22/18:  RIGHT    LEFT    PRESSURE WAVEFORM  PRESSURE WAVEFORM  BRACHIAL 124 triphasic BRACHIAL N/A Triphasic  DP 68 monophasic DP N/A Monophasic  PT 48 monophasic PT N/A Monophasic  GREAT TOE 45 dampened GREAT TOE 56     RIGHT LEFT  ABI/TBI 0.55/0.36 TBI 0.45  Unable to obtain left ABI due to incision.  He did have a little elevation of his creatinine of 1.68, which is what it was in August.  Pt will have labs re-checked early next week.    Continue asa/statin/plavix.    The remainder of the hospital course consisted of increasing mobilization and increasing intake of solids without difficulty.  CBC    Component Value Date/Time   WBC 12.1 (H) 08/22/2018 0404   RBC 3.31 (L) 08/22/2018 0404   HGB 9.4 (L) 08/22/2018 0404   HCT 30.0 (L) 08/22/2018 0404   PLT 364 08/22/2018 0404   MCV 90.6 08/22/2018 0404   MCH 28.4 08/22/2018 0404   MCHC 31.3 08/22/2018 0404   RDW 14.9 08/22/2018 0404   LYMPHSABS 1.9 06/22/2018 1912   MONOABS 1.1 (H) 06/22/2018 1912   EOSABS 0.3 06/22/2018 1912   BASOSABS 0.1 06/22/2018 1912    BMET  Component Value Date/Time   NA 135 08/22/2018 0404   NA 135 08/11/2018 1449   K 3.9 08/22/2018 0404   CL 102 08/22/2018 0404   CO2 23 08/22/2018 0404   GLUCOSE 193 (H) 08/22/2018 0404   BUN 26 (H) 08/22/2018 0404   BUN 14 08/11/2018 1449   CREATININE 1.68 (H) 08/22/2018 0404   CREATININE 0.81 08/14/2014 1126   CALCIUM 8.7 (L) 08/22/2018 0404   GFRNONAA 43 (L) 08/22/2018 0404   GFRNONAA >89 08/14/2014 1126   GFRAA 49 (L) 08/22/2018 0404   GFRAA >89 08/14/2014 1126     Discharge Instructions    Discharge patient   Complete by:  As directed    Discharge  disposition:  01-Home or Self Care   Discharge patient date:  08/24/2018      Discharge Diagnosis:  claudication  Secondary Diagnosis: Patient Active Problem List   Diagnosis Date Noted  . PAD (peripheral artery disease) (HCC) 08/21/2018  . PVD (peripheral vascular disease) (HCC) 07/18/2018  . 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 Acute coronary syndrome (HCC) 06/05/2018  . Acute pulmonary edema (HCC) 06/03/2018  . DKA, type 2 (HCC) 06/03/2018  . Diabetic acidosis without coma (HCC)   . Sebaceous cyst 08/23/2016  . Acute pain of right knee 08/23/2016  . Hidradenitis 08/23/2016  . Right hip pain 08/14/2014  . Diabetes (HCC) 11/27/2013  . Arthritis 11/27/2013  . Neuropathy in diabetes (HCC) 11/27/2013   Past Medical History:  Diagnosis Date  . Acute pulmonary edema (HCC) 06/05/2018  . Arthritis   . COPD (chronic obstructive pulmonary disease) (HCC)    " beginning stages " per pt  . Coronary artery disease   . Diabetes mellitus without complication (HCC)   . Diabetic neuropathy (HCC)   . Diabetic neuropathy (HCC)   . Emphysema/COPD (HCC)    " beginning stages"  . GERD (gastroesophageal reflux disease)   . HOH (hard of hearing)   . Myocardial infarction (HCC)   . Peripheral vascular disease (HCC)      Allergies as of 08/24/2018      Reactions   Lisinopril Other (See Comments)   Syncope   Codeine Rash      Medication List    STOP taking these medications   metFORMIN 1000 MG tablet Commonly known as:  GLUCOPHAGE     TAKE these medications   amiodarone 400 MG tablet Commonly known as:  PACERONE Take 1 tablet (400 mg total) by mouth 2 (two) times daily. For 7 days, then decrease to 400 mg daily What changed:    how much to take  when to take this   aspirin 325 MG EC tablet Take 1 tablet (325 mg total) by mouth daily.   atorvastatin 40 MG tablet Commonly known as:  LIPITOR Take 1 tablet (40 mg total) by mouth daily at 6 PM.   carvedilol 12.5 MG tablet Commonly  known as:  COREG Take 1 tablet (12.5 mg total) by mouth 2 (two) times daily with a meal.   clopidogrel 75 MG tablet Commonly known as:  PLAVIX Take 1 tablet (75 mg total) by mouth daily.   furosemide 40 MG tablet Commonly known as:  LASIX Take 1 tablet (40 mg total) by mouth daily.   glipiZIDE 5 MG tablet Commonly known as:  GLUCOTROL Take 1 tablet (5 mg total) by mouth daily before breakfast.   oxyCODONE-acetaminophen 5-325 MG tablet Commonly known as:  PERCOCET/ROXICET Take 1 tablet by mouth every 6 (six) hours as  needed for moderate pain.   potassium chloride SA 20 MEQ tablet Commonly known as:  K-DUR,KLOR-CON Take 1 tablet (20 mEq total) by mouth daily.       Discharge Instructions: [facility] Discharge instructions Lower Extremity Bypass Surgery  Please refer to the following instruction for your post-procedure care. Your surgeon or physician assistant will discuss any changes with you.  Activity  You are encouraged to walk as much as you can. You can slowly return to normal activities during the month after your surgery. Avoid strenuous activity and heavy lifting until your doctor tells you it's OK. Avoid activities such as vacuuming or swinging a golf club. Do not drive until your doctor give the OK and you are no longer taking prescription pain medications. It is also normal to have difficulty with sleep habits, eating and bowel movement after surgery. These will go away with time.  Bathing/Showering  You may shower after you go home. Do not soak in a bathtub, hot tub, or swim until the incision heals completely.  Incision Care  Clean your incision with mild soap and water. Shower every day. Pat the area dry with a clean towel. You do not need a bandage unless otherwise instructed. Do not apply any ointments or creams to your incision. If you have open wounds you will be instructed how to care for them or a visiting nurse may be  arranged for you. If you have staples or sutures along your incision they will be removed at your post-op appointment. You may have skin glue on your incision. Do not peel it off. It will come off on its own in about one week.  Wash the groin wound with soap and water daily and pat dry. (No tub bath-only shower)  Then put a dry gauze or washcloth in the groin to keep this area dry to help prevent wound infection.  Do this daily and as needed.  Do not use Vaseline or neosporin on your incisions.  Only use soap and water on your incisions and then protect and keep dry.  Diet  Resume your normal diet. There are no special food restrictions following this procedure. A low fat/ low cholesterol diet is recommended for all patients with vascular disease. In order to heal from your surgery, it is CRITICAL to get adequate nutrition. Your body requires vitamins, minerals, and protein. Vegetables are the best source of vitamins and minerals. Vegetables also provide the perfect balance of protein. Processed food has little nutritional value, so try to avoid this.  Medications  Resume taking all your medications unless your doctor or Physician Assistant tells you not to. If your incision is causing pain, you may take over-the-counter pain relievers such as acetaminophen (Tylenol). If you were prescribed a stronger pain medication, please aware these medication can cause nausea and constipation. Prevent nausea by taking the medication with a snack or meal. Avoid constipation by drinking plenty of fluids and eating foods with high amount of fiber, such as fruits, vegetables, and grains. Take Colace 100 mg (an over-the-counter stool softener) twice a day as needed for constipation.  Do not take Tylenol if you are taking prescription pain medications.  Hold taking Metformin until you get your kidney function checked on Monday or Tuesday.  Follow Up  Our office will schedule a follow up appointment 2-3 weeks  following discharge.  Please call us immediately for any of the following conditions  .Severe or worsening pain in your legs or  feet while at rest or while walking .Increase pain, redness, warmth, or drainage (pus) from your incision site(s) . Fever of 101 degree or higher . The swelling in your leg with the bypass suddenly worsens and becomes more painful than when you were in the hospital . If you have been instructed to feel your graft pulse then you should do so every day. If you can no longer feel this pulse, call the office immediately. Not all patients are given this instruction. .  Leg swelling is common after leg bypass surgery.  The swelling should improve over a few months following surgery. To improve the swelling, you may elevate your legs above the level of your heart while you are sitting or resting. Your surgeon or physician assistant may ask you to apply an ACE wrap or wear compression (TED) stockings to help to reduce swelling.  Reduce your risk of vascular disease  Stop smoking. If you would like help call [facility] at 1-800-QUIT-NOW (228 021 6680) or [place] at [phone].  . Manage your cholesterol . Maintain a desired weight . Control your diabetes weight . Control your diabetes . Keep your blood pressure down .  If you have any questions, please call the office at [phone]   Prescriptions given: 1.  Roxicet #30 No Refill (sent electronically)  Disposition: home  Patient's condition: is Good  Follow up: 1. Dr. Chestine Spore in 2 weeks (sent to office by me)   Doreatha Massed, PA-C [facility] [phone] 08/24/2018  9:39 AM  - For VQI Registry use ---   Post-op:  Wound infection: No  Graft infection: No  Transfusion: No    If yes, n/a units given New Arrhythmia: No Ipsilateral amputation: No, [ ]  Minor, [ ]  BKA, [ ]  AKA Discharge patency: [x ] Primary, [ ]  Primary assisted, [ ]  Secondary, [ ]  Occluded Patency judged by:  [ ]  Dopper only, [ ]  Palpable graft pulse, [x]  Palpable distal pulse, [ ]  ABI inc. > 0.15, [ ]  Duplex Discharge ABI: R 0.55, L unable to obtain due to incision D/C Ambulatory Status: Ambulatory  Complications: MI: No, [ ]  Troponin only, [ ]  EKG or Clinical CHF: No Resp failure:No, [ ]  Pneumonia, [ ]  Ventilator Chg in renal function: No, [ ]  Inc. Cr > 0.5, [ ]  Temp. Dialysis,  [ ]  Permanent dialysis Stroke: No, [ ]  Minor, [ ]  Major Return to OR: No  Reason for return to OR: [ ]  Bleeding, [ ]  Infection, [ ]  Thrombosis, [ ]  Revision  Discharge medications: Statin use:  yes ASA use:  yes Plavix use:  yes Beta blocker use: yes CCB use:  No ACEI use:   no ARB use:  no Coumadin use: no

## 2018-08-24 NOTE — Progress Notes (Signed)
D/C instructions given to pt. Wound care reviewed. Instructions to hold metformin and get kidney function levels drawn relayed. IV removed, clean and intact. Telemetry removed. Pt refusing HH PT and walker. Friend to escort home.  Versie Starks, RN

## 2018-08-24 NOTE — Progress Notes (Signed)
Physical Therapy Treatment Patient Details Name: Robert Sanchez MRN: [id number] DOB: 1956-05-10 Today's Date: 08/24/2018    History of Present Illness 62yo male presenting with recent history of CABG August 2019 and ABI of 0.3 in his L leg, now presenting for L femoral bypass procedure. He received vascular bypass L LE on 08/21/18. PMH pulmonary edema, OA, DM, neuropathy, CABG, knee scope, hx spine surgery, cardiac cath     PT Comments    Session focused on stair training in preparation for discharge home. Patient able to perform 10 step ups with use of right rail and supervision. Currently ambulating in room without an assistive device modified independently, but recommended use of walker for long distances to reduce antalgic gait pattern and compensatory techniques. Educated patient on generalized walking program, activity progression, and endurance conservation strategies. Pt has d/c from hospital.     Follow Up Recommendations  Home health PT     Equipment Recommendations  Rolling walker with 5" wheels;3in1 (PT)    Recommendations for Other Services       Precautions / Restrictions Precautions Precautions: Fall;Other (comment) Precaution Comments: CABG August 2019  Restrictions Weight Bearing Restrictions: No    Mobility  Bed Mobility Overal bed mobility: Modified Independent                Transfers Overall transfer level: Modified independent Equipment used: None                Ambulation/Gait Ambulation/Gait assistance: Modified independent (Device/Increase time) Gait Distance (Feet): 30 Feet Assistive device: None Gait Pattern/deviations: Step-to pattern;Decreased step length - right;Decreased stance time - left;Decreased stride length;Decreased weight shift to left;Antalgic;Step-through pattern;Narrow base of support Gait velocity: decreased    General Gait Details: Patient ambulating in room with no assistive device with antalgic gait pattern;  demonstrated no overt LOB.   Stairs Stairs: Yes Stairs assistance: Supervision Stair Management: One rail Right Number of Stairs: 10 General stair comments: Patient practiced x 10 step ups forwards and backwards leading with RLE. Supervision for safety   Wheelchair Mobility    Modified Rankin (Stroke Patients Only)       Balance Overall balance assessment: Mild deficits observed, not formally tested                                          Cognition Arousal/Alertness: Awake/alert Behavior During Therapy: WFL for tasks assessed/performed Overall Cognitive Status: Within Functional Limits for tasks assessed                                        Exercises      General Comments        Pertinent Vitals/Pain Pain Assessment: Faces Faces Pain Scale: Hurts a little bit Pain Location: L LE  Pain Descriptors / Indicators: Sore Pain Intervention(s): Monitored during session    Home Living                      Prior Function            PT Goals (current goals can now be found in the care plan section) Acute Rehab PT Goals Patient Stated Goal: to go home, get back to work  Potential to Achieve Goals: Good Progress towards PT goals: Progressing toward goals  Frequency    Min 3X/week      PT Plan Current plan remains appropriate    Co-evaluation              AM-PAC PT "6 Clicks" Daily Activity  Outcome Measure  Difficulty turning over in bed (including adjusting bedclothes, sheets and blankets)?: A Little Difficulty moving from lying on back to sitting on the side of the bed? : A Little Difficulty sitting down on and standing up from a chair with arms (e.g., wheelchair, bedside commode, etc,.)?: A Little Help needed moving to and from a bed to chair (including a wheelchair)?: A Little Help needed walking in hospital room?: A Little Help needed climbing 3-5 steps with a railing? : A Little 6 Click Score:  18    End of Session   Activity Tolerance: Patient tolerated treatment well Patient left: Other (comment)(standing in room) Nurse Communication: Mobility status PT Visit Diagnosis: Difficulty in walking, not elsewhere classified (R26.2)     Time: 1914-7829 PT Time Calculation (min) (ACUTE ONLY): 9 min  Charges:  $Therapeutic Activity: 8-22 mins                     Laurina Bustle, PT, DPT Acute Rehabilitation Services Pager [phone] Office [phone]   Vanetta Mulders 08/24/2018, 10:07 AM

## 2018-08-24 NOTE — Progress Notes (Addendum)
  Progress Note    08/24/2018 7:15 AM 3 Days Post-Op  Subjective:  Says he is getting better.  Would like one more day of getting stronger.  Afebrile HR 60's-70's NSR 110's-130's systolic 96% RA  Vitals:   08/24/18 0000 08/24/18 0429  BP: 111/66 110/63  Pulse: 71 68  Resp: 11 11  Temp: 98 F (36.7 C) (!) 97.3 F (36.3 C)  SpO2: 97% 96%    Physical Exam: Cardiac:  regular Lungs:  Non labored Incisions:  All are healing nicely Extremities:  +palpable left PT pulse and excellent doppler signal   CBC    Component Value Date/Time   WBC 12.1 (H) 08/22/2018 0404   RBC 3.31 (L) 08/22/2018 0404   HGB 9.4 (L) 08/22/2018 0404   HCT 30.0 (L) 08/22/2018 0404   PLT 364 08/22/2018 0404   MCV 90.6 08/22/2018 0404   MCH 28.4 08/22/2018 0404   MCHC 31.3 08/22/2018 0404   RDW 14.9 08/22/2018 0404   LYMPHSABS 1.9 06/22/2018 1912   MONOABS 1.1 (H) 06/22/2018 1912   EOSABS 0.3 06/22/2018 1912   BASOSABS 0.1 06/22/2018 1912    BMET    Component Value Date/Time   NA 135 08/22/2018 0404   NA 135 08/11/2018 1449   K 3.9 08/22/2018 0404   CL 102 08/22/2018 0404   CO2 23 08/22/2018 0404   GLUCOSE 193 (H) 08/22/2018 0404   BUN 26 (H) 08/22/2018 0404   BUN 14 08/11/2018 1449   CREATININE 1.68 (H) 08/22/2018 0404   CREATININE 0.81 08/14/2014 1126   CALCIUM 8.7 (L) 08/22/2018 0404   GFRNONAA 43 (L) 08/22/2018 0404   GFRNONAA >89 08/14/2014 1126   GFRAA 49 (L) 08/22/2018 0404   GFRAA >89 08/14/2014 1126    INR    Component Value Date/Time   INR 1.19 08/17/2018 1359     Intake/Output Summary (Last 24 hours) at 08/24/2018 0715 Last data filed at 08/23/2018 2010 Gross per 24 hour  Intake 720 ml  Output 725 ml  Net -5 ml     Assessment:  62 y.o. male is s/p:  Left common femoral arteryto posterior tibial artery bypass with reversed ipsilateral great saphenous vein   3 Days Post-Op  Plan: -pt with +palpable left PT pulse as well as excellent doppler signal left  PT -ambulated twice to nursing station.  Feels like he needs one more day and we will plan discharge tomorrow. -DVT prophylaxis:  Sq heparin    Doreatha Massed, PA-C [facility] [phone] 08/24/2018 7:15 AM   I have seen and evaluated the patient. I agree with the PA note as documented above. Palpable L PT pulse.  Doing well with physical therapy - wants to be discharged.  Cr increased to 1.6 but 1.4 on pre-op labs and appears close to baseline this summer at 1.6.  UOP ok.  Discussed getting his Cr re-checked on Monday to ensure not worsening.  Holding metformin.   Cephus Shelling, MD [facility] [place] Office: [phone] Pager: [phone]  Cephus Shelling

## 2018-08-24 NOTE — Discharge Instructions (Signed)
[facility]  Discharge instructions  Lower Extremity Bypass Surgery  Please refer to the following instruction for your post-procedure care. Your surgeon or physician assistant will discuss any changes with you.  Activity  You are encouraged to walk as much as you can. You can slowly return to normal activities during the month after your surgery. Avoid strenuous activity and heavy lifting until your doctor tells you it's OK. Avoid activities such as vacuuming or swinging a golf club. Do not drive until your doctor give the OK and you are no longer taking prescription pain medications. It is also normal to have difficulty with sleep habits, eating and bowel movement after surgery. These will go away with time.  Bathing/Showering  Shower daily after you go home. Do not soak in a bathtub, hot tub, or swim until the incision heals completely.  Incision Care  Clean your incision with mild soap and water. Shower every day. Pat the area dry with a clean towel. You do not need a bandage unless otherwise instructed. Do not apply any ointments or creams to your incision. If you have open wounds you will be instructed how to care for them or a visiting nurse may be arranged for you. If you have staples or sutures along your incision they will be removed at your post-op appointment. You may have skin glue on your incision. Do not peel it off. It will come off on its own in about one week.  Wash the groin wound with soap and water daily and pat dry. (No tub bath-only shower)  Then put a dry gauze or washcloth in the groin to keep this area dry to help prevent wound infection.  Do this daily and as needed.  Do not use Vaseline or neosporin on your incisions.  Only use soap and water on your incisions and then protect and keep dry.  Diet  Resume your normal diet. There are no special food restrictions following this procedure. A low fat/ low cholesterol diet is  recommended for all patients with vascular disease. In order to heal from your surgery, it is CRITICAL to get adequate nutrition. Your body requires vitamins, minerals, and protein. Vegetables are the best source of vitamins and minerals. Vegetables also provide the perfect balance of protein. Processed food has little nutritional value, so try to avoid this.  Medications  Resume taking all your medications unless your doctor or physician assistant tells you not to. If your incision is causing pain, you may take over-the-counter pain relievers such as acetaminophen (Tylenol). If you were prescribed a stronger pain medication, please aware these medication can cause nausea and constipation. Prevent nausea by taking the medication with a snack or meal. Avoid constipation by drinking plenty of fluids and eating foods with high amount of fiber, such as fruits, vegetables, and grains. Take Colace 100 mg (an over-the-counter stool softener) twice a day as needed for constipation.  Do not take Tylenol if you are taking prescription pain medications.  Hold taking your Metformin until you have your labs drawn at your doctor in the next week.   Follow Up  Our office will schedule a follow up appointment 2-3 weeks following discharge.  Please call us immediately for any of the following conditions  Severe or worsening pain in your legs or feet while at rest or while walking Increase pain, redness, warmth, or drainage (pus) from your incision site(s) Fever of 101 degree or higher The swelling in your leg  with the bypass suddenly worsens and becomes more painful than when you were in the hospital If you have been instructed to feel your graft pulse then you should do so every day. If you can no longer feel this pulse, call the office immediately. Not all patients are given this instruction.  Leg swelling is common after leg bypass surgery.  The swelling should improve over a few months following surgery.  To improve the swelling, you may elevate your legs above the level of your heart while you are sitting or resting. Your surgeon or physician assistant may ask you to apply an ACE wrap or wear compression (TED) stockings to help to reduce swelling.  Reduce your risk of vascular disease  Stop smoking. If you would like help call [facility] at 1-800-QUIT-NOW (872-521-3297) or [place] at ([phone].  Manage your cholesterol Maintain a desired weight Control your diabetes weight Control your diabetes Keep your blood pressure down  If you have any questions, please call the office at [phone]

## 2018-08-29 ENCOUNTER — Other Ambulatory Visit: Payer: Self-pay

## 2018-08-29 ENCOUNTER — Encounter: Payer: Self-pay | Admitting: Family Medicine

## 2018-08-29 ENCOUNTER — Ambulatory Visit: Payer: Self-pay | Admitting: Family Medicine

## 2018-08-29 VITALS — BP 127/65 | HR 68 | Temp 97.9°F | Resp 14 | Ht 68.0 in | Wt 206.8 lb

## 2018-08-29 DIAGNOSIS — E1151 Type 2 diabetes mellitus with diabetic peripheral angiopathy without gangrene: Secondary | ICD-10-CM

## 2018-08-29 DIAGNOSIS — Z760 Encounter for issue of repeat prescription: Secondary | ICD-10-CM

## 2018-08-29 DIAGNOSIS — R7989 Other specified abnormal findings of blood chemistry: Secondary | ICD-10-CM

## 2018-08-29 DIAGNOSIS — I739 Peripheral vascular disease, unspecified: Secondary | ICD-10-CM

## 2018-08-29 DIAGNOSIS — R600 Localized edema: Secondary | ICD-10-CM

## 2018-08-29 MED ORDER — FUROSEMIDE 40 MG PO TABS: 40.0000 mg | ORAL_TABLET | Freq: Every day | ORAL | 3 refills | Status: DC

## 2018-08-29 MED ORDER — ATORVASTATIN CALCIUM 40 MG PO TABS: 40.0000 mg | ORAL_TABLET | Freq: Every day | ORAL | 6 refills | Status: DC

## 2018-08-29 MED ORDER — POTASSIUM CHLORIDE CRYS ER 20 MEQ PO TBCR: 20.0000 meq | EXTENDED_RELEASE_TABLET | Freq: Every day | ORAL | 3 refills | Status: DC

## 2018-08-29 MED ORDER — CLOPIDOGREL BISULFATE 75 MG PO TABS: 75.0000 mg | ORAL_TABLET | Freq: Every day | ORAL | 11 refills | Status: DC

## 2018-08-29 NOTE — Progress Notes (Signed)
Chief Complaint  Patient presents with  . Follow-up    by pass surgery on left leg, was told to stop the metformin due to elevated kidney level untill followed up with PCP. Note when on metformin wasnt able to fully void.    HPI Hospital follow up for PVD Pt was discharged 08/24/18 He has a history of CABG who also had vascular disease of the left lower extremity He underwent left common femoral artery to posterior tibial artery bypass with reversed ipsilateral great saphenous vein  Elevated Creatinine He reports that he had some difficulty passing urine during the hospitalization He was discharged to home with instructions to continue asa/statin and plavix but to HOLD the metformin due to creatinine of 1.68 He was discharged home with instructions to follow up for repeat BMP He denies alcohol or smoking His previous BMP shows elevations of the creatinine but not this high Component     Latest Ref Rng & Units 06/03/2018 06/16/2018 06/18/2018 06/19/2018  Creatinine     0.61 - 1.24 mg/dL 6.04 5.40 (H) 9.81 (H) 1.40 (H)  GFR, Est Non African American     >60 mL/min >60 59 (L) 47 (L) 53 (L)  GFR, Est African American     >60 mL/min >60 >60 55 (L) >60  BUN/Creatinine Ratio     10 - 24       Component     Latest Ref Rng & Units 06/22/2018 08/11/2018 08/17/2018 08/22/2018  Creatinine     0.61 - 1.24 mg/dL 1.91 (H) 4.78 2.95 (H) 1.68 (H)  GFR, Est Non African American     >60 mL/min 42 (L) 73 51 (L) 43 (L)  GFR, Est African American     >60 mL/min 49 (L) 85 59 (L) 49 (L)  BUN/Creatinine Ratio     10 - 24  13      Type 2 Diabetes Patient reports that he has help around the house and eats 3 meals He checks his sugars and reports that his sugars are staying around the 150s He is currently on the glipizide 5mg  before breakfast He is no longer on the metformin  Lab Results  Component Value Date   HGBA1C 7.8 (A) 08/11/2018       Past Medical History:  Diagnosis Date  . Acute  pulmonary edema (HCC) 06/05/2018  . Arthritis   . COPD (chronic obstructive pulmonary disease) (HCC)    " beginning stages " per pt  . Coronary artery disease   . Diabetes mellitus without complication (HCC)   . Diabetic neuropathy (HCC)   . Diabetic neuropathy (HCC)   . Emphysema/COPD (HCC)    " beginning stages"  . GERD (gastroesophageal reflux disease)   . HOH (hard of hearing)   . Myocardial infarction (HCC)   . Peripheral vascular disease (HCC)     Current Outpatient Medications  Medication Sig Dispense Refill  . amiodarone (PACERONE) 400 MG tablet Take 1 tablet (400 mg total) by mouth 2 (two) times daily. For 7 days, then decrease to 400 mg daily (Patient taking differently: Take 200 mg by mouth daily. For 7 days, then decrease to 400 mg daily) 60 tablet 1  . aspirin EC 325 MG EC tablet Take 1 tablet (325 mg total) by mouth daily. 30 tablet 0  . atorvastatin (LIPITOR) 40 MG tablet Take 1 tablet (40 mg total) by mouth daily at 6 PM. 30 tablet 6  . carvedilol (COREG) 12.5 MG tablet Take 1 tablet (12.5 mg total)  by mouth 2 (two) times daily with a meal. 180 tablet 1  . clopidogrel (PLAVIX) 75 MG tablet Take 1 tablet (75 mg total) by mouth daily. 30 tablet 11  . furosemide (LASIX) 40 MG tablet Take 1 tablet (40 mg total) by mouth daily. 30 tablet 3  . glipiZIDE (GLUCOTROL) 5 MG tablet Take 1 tablet (5 mg total) by mouth daily before breakfast. 90 tablet 1  . oxyCODONE-acetaminophen (PERCOCET/ROXICET) 5-325 MG tablet Take 1 tablet by mouth every 6 (six) hours as needed for moderate pain. 30 tablet 0  . potassium chloride SA (K-DUR,KLOR-CON) 20 MEQ tablet Take 1 tablet (20 mEq total) by mouth daily. 30 tablet 3   No current facility-administered medications for this visit.     Allergies:  Allergies  Allergen Reactions  . Lisinopril Other (See Comments)    Syncope  . Codeine Rash    Past Surgical History:  Procedure Laterality Date  . ABDOMINAL AORTOGRAM W/LOWER EXTREMITY N/A  06/21/2018   Procedure: ABDOMINAL AORTOGRAM W/LOWER EXTREMITY;  Surgeon: Cephus Shelling, MD;  Location: [facility] INV[facility];  Service: Cardiovascular;  Laterality: N/A;  . CORONARY ARTERY BYPASS GRAFT N/A 06/12/2018   Procedure: CORONARY ARTERY BYPASS GRAFTING (CABG) x 3 WITH ENDOSCOPIC HARVESTING OF RIGHT GREATER SAPHENOUS VEIN. LIMA TO LAD. SVG TO PD. SVG TO DIAGONAL.;  Surgeon: Kerin Perna, MD;  Location: [facility] OR;  Service: Open Heart Surgery;  Laterality: N/A;  . FEMORAL-TIBIAL BYPASS GRAFT Left 08/21/2018   Procedure: BYPASS GRAFT LEFT FEMORAL TO POSTERIOR TIBIAL ARTERY USING LEFT REVERSED GREAT SAPHENOUS VEIN;  Surgeon: Cephus Shelling, MD;  Location: [facility] OR;  Service: Vascular;  Laterality: Left;  . KNEE ARTHROSCOPY    . PERIPHERAL VASCULAR INTERVENTION Left 06/21/2018   Procedure: PERIPHERAL VASCULAR INTERVENTION;  Surgeon: Cephus Shelling, MD;  Location: [facility] INVASIVE CV LAB;  Service: Cardiovascular;  Laterality: Left;  . RIGHT/LEFT HEART CATH AND CORONARY ANGIOGRAPHY N/A 06/05/2018   Procedure: RIGHT/LEFT HEART CATH AND CORONARY ANGIOGRAPHY;  Surgeon: Orpah Cobb, MD;  Location: [facility] INVASIVE CV LAB;  Service: Cardiovascular;  Laterality: N/A;  . SPINE SURGERY    . TEE WITHOUT CARDIOVERSION N/A 06/12/2018   Procedure: TRANSESOPHAGEAL ECHOCARDIOGRAM (TEE);  Surgeon: Donata Clay, Theron Arista, MD;  Location: [facility] OR;  Service: Open Heart Surgery;  Laterality: N/A;  . teeth extractions    . VEIN HARVEST Left 08/21/2018   Procedure: VEIN HARVEST LEFT GREAT SAPHENOUS VEIN;  Surgeon: Cephus Shelling, MD;  Location: [facility] OR;  Service: Vascular;  Laterality: Left;    Social History   Socioeconomic History  . Marital status: Single    Spouse name: Not on file  . Number of children: Not on file  . Years of education: Not on file  . Highest education level: Not on file  Occupational History  . Not on file  Social Needs  . Financial resource strain: Not on file  . Food insecurity:     Worry: Not on file    Inability: Not on file  . Transportation needs:    Medical: Not on file    Non-medical: Not on file  Tobacco Use  . Smoking status: Former Smoker    Packs/day: 1.00    Years: 40.00    Pack years: 40.00    Last attempt to quit: 06/02/2018    Years since quitting: 0.2  . Smokeless tobacco: Never Used  Substance and Sexual Activity  . Alcohol use: Not Currently  . Drug use: Not Currently  .  Sexual activity: Not on file  Lifestyle  . Physical activity:    Days per week: Not on file    Minutes per session: Not on file  . Stress: Not on file  Relationships  . Social connections:    Talks on phone: Not on file    Gets together: Not on file    Attends religious service: Not on file    Active member of club or organization: Not on file    Attends meetings of clubs or organizations: Not on file    Relationship status: Not on file  Other Topics Concern  . Not on file  Social History Narrative  . Not on file    Family History  Problem Relation Age of Onset  . Diabetes Mother   . Lung disease Mother   . Cancer Father   . Heart disease Father      ROS Review of Systems See HPI Constitution: No fevers or chills No malaise No diaphoresis Skin: No rash or itching Eyes: no blurry vision, no double vision GU: no dysuria or hematuria Neuro: no dizziness or headaches all others reviewed and negative   Objective: Vitals:   08/29/18 0934  BP: 127/65  Pulse: 68  Resp: 14  Temp: 97.9 F (36.6 C)  TempSrc: Oral  SpO2: 99%  Weight: 206 lb 12.8 oz (93.8 kg)  Height: 5\' 8"  (1.727 m)    Physical Exam  Constitutional: He is oriented to person, place, and time. He appears well-developed and well-nourished.  HENT:  Head: Normocephalic and atraumatic.  Eyes: Conjunctivae are normal.  Neck: Normal range of motion. Neck supple.  Cardiovascular: Normal rate, regular rhythm and normal heart sounds.  No murmur heard. Pulmonary/Chest: Effort normal and  breath sounds normal. No stridor. No respiratory distress. He has no wheezes.  Musculoskeletal:  Left leg with medial aspect with long incision that is clean and dry, no purulence  Neurological: He is alert and oriented to person, place, and time.  Skin: Skin is warm.  Palpable pulse   Psychiatric: He has a normal mood and affect. His behavior is normal. Judgment and thought content normal.    Assessment and Plan Rodric was seen today for follow-up.  Diagnoses and all orders for this visit:  Elevated serum creatinine- discussed his renal function, he can continue his glipizide -     Basic metabolic panel  PVD (peripheral vascular disease) (HCC)- continue plavix, aspirin, wound care, abstinence from smoking  DM (diabetes mellitus), type 2 with peripheral vascular complications (HCC)- discussed diabetes and compliance His home monitoring shows that his home glucose   Lower extremity edema- refilled lasix, discussed keeping leg elevated when at rest and ambulate regularly  Encounter for medication refill- refilled home maintenance meds  Other orders -     atorvastatin (LIPITOR) 40 MG tablet; Take 1 tablet (40 mg total) by mouth daily at 6 PM. -     furosemide (LASIX) 40 MG tablet; Take 1 tablet (40 mg total) by mouth daily. -     clopidogrel (PLAVIX) 75 MG tablet; Take 1 tablet (75 mg total) by mouth daily. -     potassium chloride SA (K-DUR,KLOR-CON) 20 MEQ tablet; Take 1 tablet (20 mEq total) by mouth daily.   Follow up in 3 months for a1c If creatinine is still elevated >1.5 will discontinue metformin and use something that is not renally cleared   Tashanna Dolin A Creta Levin

## 2018-08-29 NOTE — Patient Instructions (Addendum)
Continue Glipizide 5mg  before breakfast Follow up with Vascular Surgery    If you have lab work done today you will be contacted with your lab results within the next 2 weeks.  If you have not heard from [place] then please contact us. The fastest way to get your results is to register for My Chart.   IF you received an x-ray today, you will receive an invoice from [facility]. Please contact [facility] at [phone] with questions or concerns regarding your invoice.   IF you received labwork today, you will receive an invoice from [facility]. Please contact [facility] at [phone] with questions or concerns regarding your invoice.   Our billing staff will not be able to assist you with questions regarding bills from these companies.  You will be contacted with the lab results as soon as they are available. The fastest way to get your results is to activate your My Chart account. Instructions are located on the last page of this paperwork. If you have not heard from [place] regarding the results in 2 weeks, please contact this office.      Peripheral Vascular Disease Peripheral vascular disease (PVD) is a disease of the blood vessels that are not part of your heart and brain. A simple term for PVD is poor circulation. In most cases, PVD narrows the blood vessels that carry blood from your heart to the rest of your body. This can result in a decreased supply of blood to your arms, legs, and internal organs, like your stomach or kidneys. However, it most often affects a person's lower legs and feet. There are two types of PVD.  Organic PVD. This is the more common type. It is caused by damage to the structure of blood vessels.  Functional PVD. This is caused by conditions that make blood vessels contract and tighten (spasm).  Without treatment, PVD tends to get worse over time. PVD can also lead to acute ischemic limb. This is when an arm or limb suddenly has trouble getting  enough blood. This is a medical emergency. Follow these instructions at home:  Take medicines only as told by your doctor.  Do not use any tobacco products, including cigarettes, chewing tobacco, or electronic cigarettes. If you need help quitting, ask your doctor.  Lose weight if you are overweight, and maintain a healthy weight as told by your doctor.  Eat a diet that is low in fat and cholesterol. If you need help, ask your doctor.  Exercise regularly. Ask your doctor for some good activities for you.  Take good care of your feet. ? Wear comfortable shoes that fit well. ? Check your feet often for any cuts or sores. Contact a doctor if:  You have cramps in your legs while walking.  You have leg pain when you are at rest.  You have coldness in a leg or foot.  Your skin changes.  You are unable to get or have an erection (erectile dysfunction).  You have cuts or sores on your feet that are not healing. Get help right away if:  Your arm or leg turns cold and blue.  Your arms or legs become red, warm, swollen, painful, or numb.  You have chest pain or trouble breathing.  You suddenly have weakness in your face, arm, or leg.  You become very confused or you cannot speak.  You suddenly have a very bad headache.  You suddenly cannot see. This information is not intended to replace advice given to  you by your health care provider. Make sure you discuss any questions you have with your health care provider. Document Released: 01/12/2010 Document Revised: 03/25/2016 Document Reviewed: 03/28/2014 Elsevier Interactive Patient Education  2017 [employer].

## 2018-08-30 ENCOUNTER — Encounter: Payer: Self-pay | Admitting: Family Medicine

## 2018-08-30 LAB — BASIC METABOLIC PANEL
BUN/Creatinine Ratio: 10 (ref 10–24)
BUN: 12 mg/dL (ref 8–27)
CALCIUM: 9.2 mg/dL (ref 8.6–10.2)
CO2: 22 mmol/L (ref 20–29)
Chloride: 101 mmol/L (ref 96–106)
Creatinine, Ser: 1.19 mg/dL (ref 0.76–1.27)
GFR calc non Af Amer: 66 mL/min/{1.73_m2} (ref >59)
GFR, EST AFRICAN AMERICAN: 76 mL/min/{1.73_m2} (ref >59)
Glucose: 167 mg/dL — ABNORMAL HIGH (ref 65–99)
POTASSIUM: 5.1 mmol/L (ref 3.5–5.2)
SODIUM: 138 mmol/L (ref 134–144)

## 2018-08-30 NOTE — Progress Notes (Signed)
Letter sent to lab pool for mailing

## 2018-08-31 ENCOUNTER — Telehealth: Payer: Self-pay | Admitting: Family Medicine

## 2018-08-31 NOTE — Telephone Encounter (Signed)
Copied from CRM [id number]. Topic: General - Other >> Aug 31, 2018  8:43 AM Lynne Logan D wrote: Reason for CRM: Pt had lab work done on 08/29/18. He was told he would receive a call within 24 hours with results and has not heard back yet. Please advise.

## 2018-08-31 NOTE — Telephone Encounter (Signed)
Spoke to pt with message - lab letter sent. Creatinine within normal range - only glucose elevated.  Pt states he was off Metformin only for his surgery.  Pt questions if he is to restart Metformin or not.  Message sent to Dr. Eldred Manges to advise.

## 2018-09-19 ENCOUNTER — Other Ambulatory Visit: Payer: Self-pay

## 2018-09-19 ENCOUNTER — Encounter: Payer: Self-pay | Admitting: Vascular Surgery

## 2018-09-19 ENCOUNTER — Other Ambulatory Visit: Payer: Self-pay | Admitting: Cardiothoracic Surgery

## 2018-09-19 ENCOUNTER — Ambulatory Visit (INDEPENDENT_AMBULATORY_CARE_PROVIDER_SITE_OTHER): Payer: Self-pay | Admitting: Vascular Surgery

## 2018-09-19 ENCOUNTER — Encounter: Payer: Self-pay | Admitting: *Deleted

## 2018-09-19 VITALS — BP 124/74 | HR 68 | Temp 96.8°F | Resp 18 | Ht 68.0 in | Wt 200.0 lb

## 2018-09-19 DIAGNOSIS — Z951 Presence of aortocoronary bypass graft: Secondary | ICD-10-CM

## 2018-09-19 DIAGNOSIS — I739 Peripheral vascular disease, unspecified: Secondary | ICD-10-CM

## 2018-09-19 NOTE — Progress Notes (Signed)
Patient name: Robert Sanchez MRN: [id number] DOB: [birth year] Sex: male  REASON FOR VISIT: 1 month postop  HPI: Robert Sanchez is a 62 y.o. male who presents for one-month follow-up status post left common femoral to PT bypass with reverse saphenous vein on 08/21/2018 for critical ischemia with rest pain.  Patient overall is doing very well today.  He reports the pain in his left foot has significantly improved.  Still has a little bit of residual numbness in the toes.  He is no longer taking pain medication.  He has no concerns about his incisions these have all healed up.  Past Medical History:  Diagnosis Date  . Acute pulmonary edema (HCC) 06/05/2018  . Arthritis   . COPD (chronic obstructive pulmonary disease) (HCC)    " beginning stages " per pt  . Coronary artery disease   . Diabetes mellitus without complication (HCC)   . Diabetic neuropathy (HCC)   . Diabetic neuropathy (HCC)   . Emphysema/COPD (HCC)    " beginning stages"  . GERD (gastroesophageal reflux disease)   . HOH (hard of hearing)   . Myocardial infarction (HCC)   . Peripheral vascular disease [facility])     Past Surgical History:  Procedure Laterality Date  . ABDOMINAL AORTOGRAM W/LOWER EXTREMITY N/A 06/21/2018   Procedure: ABDOMINAL AORTOGRAM W/LOWER EXTREMITY;  Surgeon: Cephus Shelling, MD;  Location: [facility] INVASIVE CV LAB;  Service: Cardiovascular;  Laterality: N/A;  . CORONARY ARTERY BYPASS GRAFT N/A 06/12/2018   Procedure: CORONARY ARTERY BYPASS GRAFTING (CABG) x 3 WITH ENDOSCOPIC HARVESTING OF RIGHT GREATER SAPHENOUS VEIN. LIMA TO LAD. SVG TO PD. SVG TO DIAGONAL.;  Surgeon: Kerin Perna, MD;  Location[facility] OR;  Service: Open Heart Surgery;  Laterality: N/A;  . FEMORAL-TIBIAL BYPASS GRAFT Left 08/21/2018   Procedure: BYPASS GRAFT LEFT FEMORAL TO POSTERIOR TIBIAL ARTERY USING LEFT REVERSED GREAT SAPHENOUS VEIN;  Surgeon: Cephus Shelling, MD;  Location: [facility] OR;  Service: Vascular;  Laterality: Left;  .  KNEE ARTHROSCOPY    . PERIPHERAL VASCULAR INTERVENTION Left 06/21/2018   Procedure: PERIPHERAL VASCULAR INTERVENTION;  Surgeon: Cephus Shelling, MD;  Location: [facility] INVASIVE CV LAB;  Service: Cardiovascular;  Laterality: Left;  . RIGHT/LEFT HEART CATH AND CORONARY ANGIOGRAPHY N/A 06/05/2018   Procedure: RIGHT/LEFT HEART CATH AND CORONARY ANGIOGRAPHY;  Surgeon: Orpah Cobb, MD;  Location: [facility] INVASIVE CV LAB;  Service: Cardiovascular;  Laterality: N/A;  . SPINE SURGERY    . TEE WITHOUT CARDIOVERSION N/A 06/12/2018   Procedure: TRANSESOPHAGEAL ECHOCARDIOGRAM (TEE);  Surgeon: Donata Clay, Theron Arista, MD;  Location: [facility] OR;  Service: Open Heart Surgery;  Laterality: N/A;  . teeth extractions    . VEIN HARVEST Left 08/21/2018   Procedure: VEIN HARVEST LEFT GREAT SAPHENOUS VEIN;  Surgeon: Cephus Shelling, MD;  Location: [facility] OR;  Service: Vascular;  Laterality: Left;    Family History  Problem Relation Age of Onset  . Diabetes Mother   . Lung disease Mother   . Cancer Father   . Heart disease Father     SOCIAL HISTORY: Social History   Tobacco Use  . Smoking status: Former Smoker    Packs/day: 1.00    Years: 40.00    Pack years: 40.00    Last attempt to quit: 06/02/2018    Years since quitting: 0.2  . Smokeless tobacco: Never Used  Substance Use Topics  . Alcohol use: Not Currently    Allergies  Allergen Reactions  . Lisinopril Other (See Comments)  Syncope  . Codeine Rash    Current Outpatient Medications  Medication Sig Dispense Refill  . amiodarone (PACERONE) 400 MG tablet Take 1 tablet (400 mg total) by mouth 2 (two) times daily. For 7 days, then decrease to 400 mg daily (Patient taking differently: Take 200 mg by mouth daily. For 7 days, then decrease to 400 mg daily) 60 tablet 1  . aspirin EC 325 MG EC tablet Take 1 tablet (325 mg total) by mouth daily. 30 tablet 0  . atorvastatin (LIPITOR) 40 MG tablet Take 1 tablet (40 mg total) by mouth daily at 6 PM. 30 tablet 6  .  carvedilol (COREG) 12.5 MG tablet Take 1 tablet (12.5 mg total) by mouth 2 (two) times daily with a meal. 180 tablet 1  . clopidogrel (PLAVIX) 75 MG tablet Take 1 tablet (75 mg total) by mouth daily. 30 tablet 11  . furosemide (LASIX) 40 MG tablet Take 1 tablet (40 mg total) by mouth daily. 30 tablet 3  . glipiZIDE (GLUCOTROL) 5 MG tablet Take 1 tablet (5 mg total) by mouth daily before breakfast. 90 tablet 1  . potassium chloride SA (K-DUR,KLOR-CON) 20 MEQ tablet Take 1 tablet (20 mEq total) by mouth daily. 30 tablet 3  . oxyCODONE-acetaminophen (PERCOCET/ROXICET) 5-325 MG tablet Take 1 tablet by mouth every 6 (six) hours as needed for moderate pain. (Patient not taking: Reported on 09/19/2018) 30 tablet 0   No current facility-administered medications for this visit.     REVIEW OF SYSTEMS:  [X]  denotes positive finding, [ ]  denotes negative finding Cardiac  Comments:  Chest pain or chest pressure:    Shortness of breath upon exertion:    Short of breath when lying flat:    Irregular heart rhythm:        Vascular    Pain in calf, thigh, or hip brought on by ambulation:    Pain in feet at night that wakes you up from your sleep:     Blood clot in your veins:    Leg swelling:  x Left, improving      Pulmonary    Oxygen at home:    Productive cough:     Wheezing:         Neurologic    Sudden weakness in arms or legs:     Sudden numbness in arms or legs:     Sudden onset of difficulty speaking or slurred speech:    Temporary loss of vision in one eye:     Problems with dizziness:         Gastrointestinal    Blood in stool:     Vomited blood:         Genitourinary    Burning when urinating:     Blood in urine:        Psychiatric    Major depression:         Hematologic    Bleeding problems:    Problems with blood clotting too easily:        Skin    Rashes or ulcers:        Constitutional    Fever or chills:      PHYSICAL EXAM: Vitals:   09/19/18 1101  BP:  124/74  Pulse: 68  Resp: 18  Temp: (!) 96.8 F (36 C)  TempSrc: Oral  SpO2: 100%  Weight: 90.7 kg  Height: 5\' 8"  (1.727 m)    GENERAL: The patient is a well-nourished male, in no acute distress.  The vital signs are documented above. CARDIAC: There is a regular rate and rhythm.  VASCULAR:  2+ palpable left femoral pulse 2+ palpable left PT pulse Left groin and leg incisions all healed   DATA:   None  Assessment/Plan:  62 year old male now one month postop status post left common femoral to PT bypass with reversed GSV for critical limb ischemia with rest pain.  Overall he seems to be doing really well.  He has a palpable left posterior tibial pulse on exam today.  His foot is nice and warm.  All of his incisions have healed.  I will release him to go back to work in 1 month.  We will bring him back in 3 months with repeat ABIs and left lower extremity arterial duplex to monitor his bypass.  I will have him see one of the NP's or PA's at that time.   Cephus Shellinghristopher J. Ladarrian Asencio, MD [facility] [place] Office: [phone] Pager: [phone]   Cephus Shellinghristopher J Karah Caruthers

## 2018-09-20 ENCOUNTER — Encounter: Payer: Self-pay | Admitting: Cardiothoracic Surgery

## 2018-11-15 ENCOUNTER — Encounter: Payer: Self-pay | Admitting: Cardiothoracic Surgery

## 2018-11-17 ENCOUNTER — Encounter: Payer: Self-pay | Admitting: Cardiothoracic Surgery

## 2018-11-22 ENCOUNTER — Encounter: Payer: Self-pay | Admitting: Cardiothoracic Surgery

## 2018-11-27 ENCOUNTER — Ambulatory Visit: Payer: Self-pay | Admitting: Family Medicine

## 2018-12-15 ENCOUNTER — Other Ambulatory Visit: Payer: Self-pay

## 2018-12-15 DIAGNOSIS — I739 Peripheral vascular disease, unspecified: Secondary | ICD-10-CM

## 2018-12-15 DIAGNOSIS — I998 Other disorder of circulatory system: Secondary | ICD-10-CM

## 2018-12-15 DIAGNOSIS — I70229 Atherosclerosis of native arteries of extremities with rest pain, unspecified extremity: Secondary | ICD-10-CM

## 2018-12-18 NOTE — Progress Notes (Deleted)
HISTORY AND PHYSICAL     CC:  follow up. Requesting Provider:  Doristine Bosworth, MD  HPI: This is a 63 y.o. male who is here today for follow up.  He has hx of left common femoral to PT bypass with reverse saphenous vein on 08/21/2018 for critical ischemia with rest pain by Dr. Chestine Spore.  In November at his visit, he was doing well and the pain in his left foot had significantly improved.  He had a little residual numbness in his toes.  His incisions were healed.  He had a 2+ palpable left PT pulse.   The pt returns today for follow up.    The pt *** on a statin for cholesterol management.    The pt *** have diabetes. The pt *** on *** for hypertension.  The pt *** on an aspirin.  Tobacco hx:  *** Other AC:  ***  Past Medical History:  Diagnosis Date  . Acute pulmonary edema (HCC) 06/05/2018  . Arthritis   . COPD (chronic obstructive pulmonary disease) (HCC)    " beginning stages " per pt  . Coronary artery disease   . Diabetes mellitus without complication (HCC)   . Diabetic neuropathy (HCC)   . Diabetic neuropathy (HCC)   . Emphysema/COPD (HCC)    " beginning stages"  . GERD (gastroesophageal reflux disease)   . HOH (hard of hearing)   . Myocardial infarction (HCC)   . Peripheral vascular disease [facility])     Past Surgical History:  Procedure Laterality Date  . ABDOMINAL AORTOGRAM W/LOWER EXTREMITY N/A 06/21/2018   Procedure: ABDOMINAL AORTOGRAM W/LOWER EXTREMITY;  Surgeon: Cephus Shelling, MD;  Location: [facility] INVASIVE CV LAB;  Service: Cardiovascular;  Laterality: N/A;  . CORONARY ARTERY BYPASS GRAFT N/A 06/12/2018   Procedure: CORONARY ARTERY BYPASS GRAFTING (CABG) x 3 WITH ENDOSCOPIC HARVESTING OF RIGHT GREATER SAPHENOUS VEIN. LIMA TO LAD. SVG TO PD. SVG TO DIAGONAL.;  Surgeon: Kerin Perna, MD;  Location: [facility] OR;  Service: Open Heart Surgery;  Laterality: N/A;  . FEMORAL-TIBIAL BYPASS GRAFT Left 08/21/2018   Procedure: BYPASS GRAFT LEFT FEMORAL TO POSTERIOR TIBIAL  ARTERY USING LEFT REVERSED GREAT SAPHENOUS VEIN;  Surgeon: Cephus Shelling, MD;  Location: [facility] OR;  Service: Vascular;  Laterality: Left;  . KNEE ARTHROSCOPY    . PERIPHERAL VASCULAR INTERVENTION Left 06/21/2018   Procedure: PERIPHERAL VASCULAR INTERVENTION;  Surgeon: Cephus Shelling, MD;  Location: [name] INVASIVE CV LAB;  Service: Cardiovascular;  Laterality: Left;  . RIGHT/LEFT HEART CATH AND CORONARY ANGIOGRAPHY N/A 06/05/2018   Procedure: RIGHT/LEFT HEART CATH AND CORONARY ANGIOGRAPHY;  Surgeon: Orpah Cobb, MD;  Location: [facility] INVASIVE CV LAB;  Service: Cardiovascular;  Laterality: N/A;  . SPINE SURGERY    . TEE WITHOUT CARDIOVERSION N/A 06/12/2018   Procedure: TRANSESOPHAGEAL ECHOCARDIOGRAM (TEE);  Surgeon: Donata Clay, Theron Arista, MD;  Location[facility];  Service: Open Heart Surgery;  Laterality: N/A;  . teeth extractions    . VEIN HARVEST Left 08/21/2018   Procedure: VEIN HARVEST LEFT GREAT SAPHENOUS VEIN;  Surgeon: Cephus Shelling, MD;  Location: [facility] OR;  Service: Vascular;  Laterality: Left;    Allergies  Allergen Reactions  . Lisinopril Other (See Comments)    Syncope  . Codeine Rash    Current Outpatient Medications  Medication Sig Dispense Refill  . amiodarone (PACERONE) 400 MG tablet Take 1 tablet (400 mg total) by mouth 2 (two) times daily. For 7 days, then decrease to 400 mg daily (Patient taking differently:  Take 200 mg by mouth daily. For 7 days, then decrease to 400 mg daily) 60 tablet 1  . aspirin EC 325 MG EC tablet Take 1 tablet (325 mg total) by mouth daily. 30 tablet 0  . atorvastatin (LIPITOR) 40 MG tablet Take 1 tablet (40 mg total) by mouth daily at 6 PM. 30 tablet 6  . carvedilol (COREG) 12.5 MG tablet Take 1 tablet (12.5 mg total) by mouth 2 (two) times daily with a meal. 180 tablet 1  . clopidogrel (PLAVIX) 75 MG tablet Take 1 tablet (75 mg total) by mouth daily. 30 tablet 11  . furosemide (LASIX) 40 MG tablet Take 1 tablet (40 mg total) by mouth daily. 30  tablet 3  . glipiZIDE (GLUCOTROL) 5 MG tablet Take 1 tablet (5 mg total) by mouth daily before breakfast. 90 tablet 1  . oxyCODONE-acetaminophen (PERCOCET/ROXICET) 5-325 MG tablet Take 1 tablet by mouth every 6 (six) hours as needed for moderate pain. (Patient not taking: Reported on 09/19/2018) 30 tablet 0  . potassium chloride SA (K-DUR,KLOR-CON) 20 MEQ tablet Take 1 tablet (20 mEq total) by mouth daily. 30 tablet 3   No current facility-administered medications for this visit.     Family History  Problem Relation Age of Onset  . Diabetes Mother   . Lung disease Mother   . Cancer Father   . Heart disease Father     Social History   Socioeconomic History  . Marital status: Single    Spouse name: Not on file  . Number of children: Not on file  . Years of education: Not on file  . Highest education level: Not on file  Occupational History  . Not on file  Social Needs  . Financial resource strain: Not on file  . Food insecurity:    Worry: Not on file    Inability: Not on file  . Transportation needs:    Medical: Not on file    Non-medical: Not on file  Tobacco Use  . Smoking status: Former Smoker    Packs/day: 1.00    Years: 40.00    Pack years: 40.00    Last attempt to quit: 06/02/2018    Years since quitting: 0.5  . Smokeless tobacco: Never Used  Substance and Sexual Activity  . Alcohol use: Not Currently  . Drug use: Not Currently  . Sexual activity: Not on file  Lifestyle  . Physical activity:    Days per week: Not on file    Minutes per session: Not on file  . Stress: Not on file  Relationships  . Social connections:    Talks on phone: Not on file    Gets together: Not on file    Attends religious service: Not on file    Active member of club or organization: Not on file    Attends meetings of clubs or organizations: Not on file    Relationship status: Not on file  . Intimate partner violence:    Fear of current or ex partner: Not on file    Emotionally  abused: Not on file    Physically abused: Not on file    Forced sexual activity: Not on file  Other Topics Concern  . Not on file  Social History Narrative  . Not on file     REVIEW OF SYSTEMS:  *** [X]  denotes positive finding, [ ]  denotes negative finding Cardiac  Comments:  Chest pain or chest pressure:    Shortness of breath upon exertion:  Short of breath when lying flat:    Irregular heart rhythm:        Vascular    Pain in calf, thigh, or hip brought on by ambulation:    Pain in feet at night that wakes you up from your sleep:     Blood clot in your veins:    Leg swelling:         Pulmonary    Oxygen at home:    Productive cough:     Wheezing:         Neurologic    Sudden weakness in arms or legs:     Sudden numbness in arms or legs:     Sudden onset of difficulty speaking or slurred speech:    Temporary loss of vision in one eye:     Problems with dizziness:         Gastrointestinal    Blood in stool:     Vomited blood:         Genitourinary    Burning when urinating:     Blood in urine:        Psychiatric    Major depression:         Hematologic    Bleeding problems:    Problems with blood clotting too easily:        Skin    Rashes or ulcers:        Constitutional    Fever or chills:      PHYSICAL EXAMINATION:  ***  General:  WDWN in NAD; vital signs documented above Gait: Not observed HENT: WNL, normocephalic Pulmonary: normal non-labored breathing , without Rales, rhonchi,  wheezing Cardiac: {Desc; regular/irreg:14544} HR, without  Murmurs; {With/Without:20273} carotid bruit*** Abdomen: soft, NT, no masses Skin: {With/Without:20273} rashes Vascular Exam/Pulses:  Right Left  Radial {Exam; arterial pulse strength 0-4:30167} {Exam; arterial pulse strength 0-4:30167}  Ulnar {Exam; arterial pulse strength 0-4:30167} {Exam; arterial pulse strength 0-4:30167}  Femoral {Exam; arterial pulse strength 0-4:30167} {Exam; arterial pulse  strength 0-4:30167}  Popliteal {Exam; arterial pulse strength 0-4:30167} {Exam; arterial pulse strength 0-4:30167}  DP {Exam; arterial pulse strength 0-4:30167} {Exam; arterial pulse strength 0-4:30167}  PT {Exam; arterial pulse strength 0-4:30167} {Exam; arterial pulse strength 0-4:30167}   Extremities: {With/Without:20273} ischemic changes, {With/Without:20273} Gangrene , {With/Without:20273} cellulitis; {With/Without:20273} open wounds;  Musculoskeletal: no muscle wasting or atrophy  Neurologic: A&O X 3;  No focal weakness or paresthesias are detected Psychiatric:  The pt has {Desc; normal/abnormal:11317::"Normal"} affect.   Non-Invasive Vascular Imaging:   ABI's/TBI's on 12/19/2018: Right:  *** Left:  ***  Arterial duplex on 12/19/2018:  Previous ABI's/TBI's on 07/18/18 (pre-op): Right:  0.63/0.52 Left:  0.43/unable to obtain    ASSESSMENT/PLAN:: 63 y.o. male here for follow up for left common femoral to PT bypass with reverse saphenous vein on 08/21/2018 for critical ischemia with rest pain by Dr. Chestine Sporelark   -***   Doreatha MassedSamantha Tiara Maultsby, PA-C [facility] [phone]  Clinic MD:   Early

## 2018-12-19 ENCOUNTER — Ambulatory Visit: Payer: Self-pay | Admitting: Family

## 2018-12-19 ENCOUNTER — Ambulatory Visit: Payer: Self-pay | Admitting: Vascular Surgery

## 2018-12-19 ENCOUNTER — Encounter (HOSPITAL_COMMUNITY): Payer: Self-pay

## 2018-12-19 ENCOUNTER — Encounter: Payer: Self-pay | Admitting: Family

## 2019-04-30 ENCOUNTER — Telehealth: Payer: Self-pay | Admitting: *Deleted

## 2019-04-30 DIAGNOSIS — Z20822 Contact with and (suspected) exposure to covid-19: Secondary | ICD-10-CM

## 2019-04-30 NOTE — Telephone Encounter (Signed)
[place] request COVID testing  Patient is symptomatic - patient advised if he has worsening symptoms- be seen at [facility]/ED

## 2019-05-01 ENCOUNTER — Other Ambulatory Visit: Payer: Self-pay

## 2019-05-01 DIAGNOSIS — Z20822 Contact with and (suspected) exposure to covid-19: Secondary | ICD-10-CM

## 2019-05-04 ENCOUNTER — Inpatient Hospital Stay (HOSPITAL_COMMUNITY)
Admission: EM | Admit: 2019-05-04 | Discharge: 2019-05-06 | DRG: 292 | Disposition: A | Discharge status: Home / Self Care | Payer: Self-pay | Attending: Internal Medicine | Admitting: Internal Medicine

## 2019-05-04 ENCOUNTER — Emergency Department (HOSPITAL_COMMUNITY): Payer: Self-pay

## 2019-05-04 ENCOUNTER — Encounter (HOSPITAL_COMMUNITY): Payer: Self-pay | Admitting: Emergency Medicine

## 2019-05-04 ENCOUNTER — Other Ambulatory Visit: Payer: Self-pay

## 2019-05-04 DIAGNOSIS — Z87891 Personal history of nicotine dependence: Secondary | ICD-10-CM

## 2019-05-04 DIAGNOSIS — Z6831 Body mass index (BMI) 31.0-31.9, adult: Secondary | ICD-10-CM

## 2019-05-04 DIAGNOSIS — H9190 Unspecified hearing loss, unspecified ear: Secondary | ICD-10-CM | POA: Diagnosis present

## 2019-05-04 DIAGNOSIS — J449 Chronic obstructive pulmonary disease, unspecified: Secondary | ICD-10-CM | POA: Diagnosis present

## 2019-05-04 DIAGNOSIS — I5023 Acute on chronic systolic (congestive) heart failure: Principal | ICD-10-CM | POA: Diagnosis present

## 2019-05-04 DIAGNOSIS — K219 Gastro-esophageal reflux disease without esophagitis: Secondary | ICD-10-CM | POA: Diagnosis present

## 2019-05-04 DIAGNOSIS — Z7984 Long term (current) use of oral hypoglycemic drugs: Secondary | ICD-10-CM

## 2019-05-04 DIAGNOSIS — D649 Anemia, unspecified: Secondary | ICD-10-CM | POA: Diagnosis present

## 2019-05-04 DIAGNOSIS — E1151 Type 2 diabetes mellitus with diabetic peripheral angiopathy without gangrene: Secondary | ICD-10-CM | POA: Diagnosis present

## 2019-05-04 DIAGNOSIS — E1165 Type 2 diabetes mellitus with hyperglycemia: Secondary | ICD-10-CM

## 2019-05-04 DIAGNOSIS — Z7902 Long term (current) use of antithrombotics/antiplatelets: Secondary | ICD-10-CM

## 2019-05-04 DIAGNOSIS — I252 Old myocardial infarction: Secondary | ICD-10-CM

## 2019-05-04 DIAGNOSIS — Z1159 Encounter for screening for other viral diseases: Secondary | ICD-10-CM

## 2019-05-04 DIAGNOSIS — R945 Abnormal results of liver function studies: Secondary | ICD-10-CM

## 2019-05-04 DIAGNOSIS — Z79899 Other long term (current) drug therapy: Secondary | ICD-10-CM

## 2019-05-04 DIAGNOSIS — E1122 Type 2 diabetes mellitus with diabetic chronic kidney disease: Secondary | ICD-10-CM | POA: Diagnosis present

## 2019-05-04 DIAGNOSIS — N183 Chronic kidney disease, stage 3 (moderate): Secondary | ICD-10-CM | POA: Diagnosis present

## 2019-05-04 DIAGNOSIS — Z833 Family history of diabetes mellitus: Secondary | ICD-10-CM

## 2019-05-04 DIAGNOSIS — Z9114 Patient's other noncompliance with medication regimen: Secondary | ICD-10-CM

## 2019-05-04 DIAGNOSIS — I5021 Acute systolic (congestive) heart failure: Secondary | ICD-10-CM

## 2019-05-04 DIAGNOSIS — I739 Peripheral vascular disease, unspecified: Secondary | ICD-10-CM | POA: Diagnosis present

## 2019-05-04 DIAGNOSIS — Z951 Presence of aortocoronary bypass graft: Secondary | ICD-10-CM

## 2019-05-04 DIAGNOSIS — I2510 Atherosclerotic heart disease of native coronary artery without angina pectoris: Secondary | ICD-10-CM | POA: Diagnosis present

## 2019-05-04 DIAGNOSIS — E1140 Type 2 diabetes mellitus with diabetic neuropathy, unspecified: Secondary | ICD-10-CM | POA: Diagnosis present

## 2019-05-04 DIAGNOSIS — E669 Obesity, unspecified: Secondary | ICD-10-CM | POA: Diagnosis present

## 2019-05-04 DIAGNOSIS — E119 Type 2 diabetes mellitus without complications: Secondary | ICD-10-CM

## 2019-05-04 DIAGNOSIS — Z7982 Long term (current) use of aspirin: Secondary | ICD-10-CM

## 2019-05-04 DIAGNOSIS — R0602 Shortness of breath: Secondary | ICD-10-CM

## 2019-05-04 DIAGNOSIS — N289 Disorder of kidney and ureter, unspecified: Secondary | ICD-10-CM

## 2019-05-04 DIAGNOSIS — I248 Other forms of acute ischemic heart disease: Secondary | ICD-10-CM | POA: Diagnosis present

## 2019-05-04 DIAGNOSIS — Z8249 Family history of ischemic heart disease and other diseases of the circulatory system: Secondary | ICD-10-CM

## 2019-05-04 DIAGNOSIS — R7989 Other specified abnormal findings of blood chemistry: Secondary | ICD-10-CM | POA: Diagnosis present

## 2019-05-04 DIAGNOSIS — E1169 Type 2 diabetes mellitus with other specified complication: Secondary | ICD-10-CM

## 2019-05-04 LAB — CBC WITH DIFFERENTIAL/PLATELET
Abs Immature Granulocytes: 0.05 10*3/uL (ref 0.00–0.07)
Basophils Absolute: 0.1 10*3/uL (ref 0.0–0.1)
Basophils Relative: 1 %
Eosinophils Absolute: 0.1 10*3/uL (ref 0.0–0.5)
Eosinophils Relative: 1 %
HCT: 38 % — ABNORMAL LOW (ref 39.0–52.0)
Hemoglobin: 11.5 g/dL — ABNORMAL LOW (ref 13.0–17.0)
Immature Granulocytes: 1 %
Lymphocytes Relative: 16 %
Lymphs Abs: 1.5 10*3/uL (ref 0.7–4.0)
MCH: 27.2 pg (ref 26.0–34.0)
MCHC: 30.3 g/dL (ref 30.0–36.0)
MCV: 89.8 fL (ref 80.0–100.0)
Monocytes Absolute: 0.6 10*3/uL (ref 0.1–1.0)
Monocytes Relative: 7 %
Neutro Abs: 7 10*3/uL (ref 1.7–7.7)
Neutrophils Relative %: 74 %
Platelets: 345 10*3/uL (ref 150–400)
RBC: 4.23 MIL/uL (ref 4.22–5.81)
RDW: 16.9 % — ABNORMAL HIGH (ref 11.5–15.5)
WBC: 9.4 10*3/uL (ref 4.0–10.5)
nRBC: 0 % (ref 0.0–0.2)

## 2019-05-04 LAB — TROPONIN I (HIGH SENSITIVITY)
Troponin I (High Sensitivity): 27 ng/L — ABNORMAL HIGH (ref <18)
Troponin I (High Sensitivity): 26 ng/L — ABNORMAL HIGH (ref <18)

## 2019-05-04 LAB — BRAIN NATRIURETIC PEPTIDE: B Natriuretic Peptide: 1562.1 pg/mL — ABNORMAL HIGH (ref 0.0–100.0)

## 2019-05-04 LAB — COMPREHENSIVE METABOLIC PANEL
ALT: 353 U/L — ABNORMAL HIGH (ref 0–44)
AST: 108 U/L — ABNORMAL HIGH (ref 15–41)
Albumin: 3.6 g/dL (ref 3.5–5.0)
Alkaline Phosphatase: 64 U/L (ref 38–126)
Anion gap: 11 (ref 5–15)
BUN: 24 mg/dL — ABNORMAL HIGH (ref 8–23)
CO2: 18 mmol/L — ABNORMAL LOW (ref 22–32)
Calcium: 9 mg/dL (ref 8.9–10.3)
Chloride: 108 mmol/L (ref 98–111)
Creatinine, Ser: 1.38 mg/dL — ABNORMAL HIGH (ref 0.61–1.24)
GFR calc Af Amer: >60 mL/min (ref >60)
GFR calc non Af Amer: 55 mL/min — ABNORMAL LOW (ref >60)
Glucose, Bld: 261 mg/dL — ABNORMAL HIGH (ref 70–99)
Potassium: 4.5 mmol/L (ref 3.5–5.1)
Sodium: 137 mmol/L (ref 135–145)
Total Bilirubin: 1.8 mg/dL — ABNORMAL HIGH (ref 0.3–1.2)
Total Protein: 8.1 g/dL (ref 6.5–8.1)

## 2019-05-04 LAB — GLUCOSE, CAPILLARY: Glucose-Capillary: 175 mg/dL — ABNORMAL HIGH (ref 70–99)

## 2019-05-04 LAB — BILIRUBIN, FRACTIONATED(TOT/DIR/INDIR)
Bilirubin, Direct: 0.4 mg/dL — ABNORMAL HIGH (ref 0.0–0.2)
Indirect Bilirubin: 1.3 mg/dL — ABNORMAL HIGH (ref 0.3–0.9)
Total Bilirubin: 1.7 mg/dL — ABNORMAL HIGH (ref 0.3–1.2)

## 2019-05-04 LAB — SARS CORONAVIRUS 2 BY RT PCR (HOSPITAL ORDER, PERFORMED IN ~~LOC~~ HOSPITAL LAB): SARS Coronavirus 2: NEGATIVE

## 2019-05-04 MED ORDER — INSULIN ASPART 100 UNIT/ML ~~LOC~~ SOLN
0.0000 [IU] | Freq: Three times a day (TID) | SUBCUTANEOUS | Status: DC
  Administered 2019-05-05 – 2019-05-06 (×4): 2 [IU] via SUBCUTANEOUS

## 2019-05-04 MED ORDER — SODIUM CHLORIDE 0.9 % IV SOLN: 250.0000 mL | INTRAVENOUS | Status: DC | PRN

## 2019-05-04 MED ORDER — INSULIN ASPART 100 UNIT/ML ~~LOC~~ SOLN
0.0000 [IU] | Freq: Every day | SUBCUTANEOUS | Status: DC
  Administered 2019-05-05: 22:00:00 2 [IU] via SUBCUTANEOUS

## 2019-05-04 MED ORDER — CARVEDILOL 12.5 MG PO TABS: 12.5000 mg | ORAL_TABLET | Freq: Two times a day (BID) | ORAL | Status: DC

## 2019-05-04 MED ORDER — ONDANSETRON HCL 4 MG/2ML IJ SOLN: 4.0000 mg | Freq: Four times a day (QID) | INTRAMUSCULAR | Status: DC | PRN

## 2019-05-04 MED ORDER — SODIUM CHLORIDE 0.9% FLUSH: 3.0000 mL | INTRAVENOUS | Status: DC | PRN

## 2019-05-04 MED ORDER — ASPIRIN 325 MG PO TABS
325.0000 mg | ORAL_TABLET | Freq: Every day | ORAL | Status: DC
  Administered 2019-05-05 – 2019-05-06 (×2): 325 mg via ORAL
  Filled 2019-05-04 (×2): qty 1

## 2019-05-04 MED ORDER — FUROSEMIDE 10 MG/ML IJ SOLN
40.0000 mg | Freq: Two times a day (BID) | INTRAMUSCULAR | Status: DC
  Administered 2019-05-05 – 2019-05-06 (×3): 40 mg via INTRAVENOUS
  Filled 2019-05-04 (×3): qty 4

## 2019-05-04 MED ORDER — CARVEDILOL 12.5 MG PO TABS
12.5000 mg | ORAL_TABLET | Freq: Two times a day (BID) | ORAL | Status: DC
  Administered 2019-05-04 – 2019-05-05 (×2): 12.5 mg via ORAL
  Filled 2019-05-04 (×2): qty 1

## 2019-05-04 MED ORDER — CLOPIDOGREL BISULFATE 75 MG PO TABS
75.0000 mg | ORAL_TABLET | Freq: Every day | ORAL | Status: DC
  Administered 2019-05-05 – 2019-05-06 (×2): 75 mg via ORAL
  Filled 2019-05-04 (×2): qty 1

## 2019-05-04 MED ORDER — ACETAMINOPHEN 325 MG PO TABS: 650.0000 mg | ORAL_TABLET | ORAL | Status: DC | PRN

## 2019-05-04 MED ORDER — ENOXAPARIN SODIUM 40 MG/0.4ML ~~LOC~~ SOLN
40.0000 mg | SUBCUTANEOUS | Status: DC
  Administered 2019-05-04 – 2019-05-05 (×2): 40 mg via SUBCUTANEOUS
  Filled 2019-05-04 (×2): qty 0.4

## 2019-05-04 MED ORDER — FUROSEMIDE 10 MG/ML IJ SOLN
60.0000 mg | Freq: Once | INTRAMUSCULAR | Status: AC
  Administered 2019-05-04: 60 mg via INTRAVENOUS
  Filled 2019-05-04: qty 6

## 2019-05-04 MED ORDER — SODIUM CHLORIDE 0.9% FLUSH
3.0000 mL | Freq: Two times a day (BID) | INTRAVENOUS | Status: DC
  Administered 2019-05-04 – 2019-05-06 (×4): 3 mL via INTRAVENOUS

## 2019-05-04 MED ORDER — ATORVASTATIN CALCIUM 40 MG PO TABS
40.0000 mg | ORAL_TABLET | Freq: Every day | ORAL | Status: DC
  Administered 2019-05-05: 40 mg via ORAL
  Filled 2019-05-04: qty 1

## 2019-05-04 NOTE — ED Triage Notes (Signed)
Pt c/o bilateral leg swelling shortness of breath with exertion, and abdominal distension. Pt denies chest pain/fever. Pt reports he is out of most of his home meds.

## 2019-05-04 NOTE — ED Provider Notes (Signed)
Emergency Department Provider Note   I have reviewed the triage vital signs and the nursing notes.   HISTORY  Chief Complaint Shortness of Breath   HPI Robert Sanchez is a 63 y.o. male with PMH of COPD, GERD, CAD s/p CABG, PAD, and sCHF presents to the emergency department with worsening bilateral lower extremity edema, PND, and exertional dyspnea.  Patient denies any fevers.  He states he did initially go for COVID-19 testing but has not had fevers or chills.  No sick contacts.  He is awaiting test results at this time.  Patient states that he is normally compliant with his medications but due to lack of funds he has been out of some of his home prescription medications.  He is unsure exactly which ones he has been not taking but states it is been over the last several weeks.  He denies any rash.  He states that he is afraid to sleep because he frequently wakes up at night gasping for air and then has to sleep propped up on pillows.   Past Medical History:  Diagnosis Date   Acute pulmonary edema (HCC) 06/05/2018   Arthritis    COPD (chronic obstructive pulmonary disease) (HCC)    " beginning stages " per pt   Coronary artery disease    Diabetes mellitus without complication (HCC)    Diabetic neuropathy (HCC)    Diabetic neuropathy (HCC)    Emphysema/COPD (HCC)    " beginning stages"   GERD (gastroesophageal reflux disease)    HOH (hard of hearing)    Myocardial infarction [facility](HCC)    Peripheral vascular disease (HCC)     Patient Active Problem List   Diagnosis Date Noted   Acute on chronic systolic CHF (congestive heart failure) (HCC) 05/04/2019   Mild renal insufficiency 05/04/2019   LFT elevation 05/04/2019   PAD (peripheral artery disease) (HCC) 08/21/2018   PVD (peripheral vascular disease) (HCC) 07/18/2018   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   CAD (coronary artery disease) 06/05/2018   Acute pulmonary edema (HCC) 06/03/2018   DKA, type 2 (HCC) 06/03/2018    Diabetic acidosis without coma (HCC)    Sebaceous cyst 08/23/2016   Acute pain of right knee 08/23/2016   Hidradenitis 08/23/2016   Right hip pain 08/14/2014   Diabetes mellitus type II, non insulin dependent (HCC) 11/27/2013   Arthritis 11/27/2013   Neuropathy in diabetes (HCC) 11/27/2013    Past Surgical History:  Procedure Laterality Date   ABDOMINAL AORTOGRAM W/LOWER EXTREMITY N/A 06/21/2018   Procedure: ABDOMINAL AORTOGRAM W/LOWER EXTREMITY;  Surgeon: Cephus Shellinglark, Christopher J, MD;  Location: [facility] INVASIVE CV LAB;  Service: Cardiovascular;  Laterality: N/A;   CORONARY ARTERY BYPASS GRAFT N/A 06/12/2018   Procedure: CORONARY ARTERY BYPASS GRAFTING (CABG) x 3 WITH ENDOSCOPIC HARVESTING OF RIGHT GREATER SAPHENOUS VEIN. LIMA TO LAD. SVG TO PD. SVG TO DIAGONAL.;  Surgeon: Kerin PernaVan Trigt, Peter, MD;  [facility];  Service: Open Heart Surgery;  Laterality: N/A;   FEMORAL-TIBIAL BYPASS GRAFT Left 08/21/2018   Procedure: BYPASS GRAFT LEFT FEMORAL TO POSTERIOR TIBIAL ARTERY USING LEFT REVERSED GREAT SAPHENOUS VEIN;  Surgeon: Cephus Shellinglark, Christopher J, MD;  [facility];  Service: Vascular;  Laterality: Left;   KNEE ARTHROSCOPY     PERIPHERAL VASCULAR INTERVENTION Left 06/21/2018   Procedure: PERIPHERAL VASCULAR INTERVENTION;  Surgeon: Cephus Shellinglark, Christopher J, MD;  Location: [facility] INVAS[facility] CV LAB;  Service: Cardiovascular;  Laterality: Left;   RIGHT/LEFT HEART CATH AND CORONARY ANGIOGRAPHY N/A 06/05/2018   Procedure:  RIGHT/LEFT HEART CATH AND CORONARY ANGIOGRAPHY;  Surgeon: Orpah CobbKadakia, Ajay, MD;  Location: [facility] INVASIVE CV LAB;  Service: Cardiovascular;  Laterality: N/A;   SPINE SURGERY     TEE WITHOUT CARDIOVERSION N/A 06/12/2018   Procedure: TRANSESOPHAGEAL ECHOCARDIOGRAM (TEE);  Surgeon: Donata ClayVan Trigt, Theron AristaPeter, MD;  [facility];  Service: Open Heart Surgery;  Laterality: N/A;   teeth extractions     VEIN HARVEST Left 08/21/2018   Procedure: VEIN HARVEST LEFT GREAT SAPHENOUS VEIN;  Surgeon: Cephus Shellinglark,  Christopher J, MD;  [facility];  Service: Vascular;  Laterality: Left;    Allergies Lisinopril and Codeine  Family History  Problem Relation Age of Onset   Diabetes Mother    Lung disease Mother    Cancer Father    Heart disease Father     Social History Social History   Tobacco Use   Smoking status: Former Smoker    Packs/day: 1.00    Years: 40.00    Pack years: 40.00    Quit date: 06/02/2018    Years since quitting: 0.9   Smokeless tobacco: Never Used  Substance Use Topics   Alcohol use: Not Currently   Drug use: Not Currently    Review of Systems  Constitutional: No fever/chills Eyes: No visual changes. ENT: No sore throat. Cardiovascular: Denies chest pain. Positive bilateral LE edema.  Respiratory: Positive shortness of breath, PND.  Gastrointestinal: No abdominal pain.  No nausea, no vomiting.  No diarrhea.  No constipation. Genitourinary: Negative for dysuria. Musculoskeletal: Negative for back pain. Skin: Negative for rash. Neurological: Negative for headaches, focal weakness or numbness.  10-point ROS otherwise negative.  ____________________________________________   PHYSICAL EXAM:  VITAL SIGNS: ED Triage Vitals  Enc Vitals Group     BP 05/04/19 1541 (!) 171/101     Pulse Rate 05/04/19 1541 100     Resp 05/04/19 1541 16     Temp 05/04/19 1541 (!) 97.3 F (36.3 C)     Temp Source 05/04/19 1541 Oral     SpO2 05/04/19 1541 98 %   Constitutional: Alert and oriented. Well appearing and in no acute distress. Eyes: Conjunctivae are normal. Head: Atraumatic. Nose: No congestion/rhinnorhea. Mouth/Throat: Mucous membranes are moist. Neck: No stridor.   Cardiovascular: Tachycardia. Good peripheral circulation. Grossly normal heart sounds.   Respiratory: Increased respiratory effort.  No retractions. Lungs crackles at the bases without wheezing.  Gastrointestinal: Soft and nontender. No distention.  Musculoskeletal: No lower extremity  tenderness with 2+ pitting edema. No gross deformities of extremities. Neurologic:  Normal speech and language.  Skin:  Skin is warm, dry and intact. No rash noted.  ____________________________________________   LABS (all labs ordered are listed, but only abnormal results are displayed)  Labs Reviewed  COMPREHENSIVE METABOLIC PANEL - Abnormal; Notable for the following components:      Result Value   CO2 18 (*)    Glucose, Bld 261 (*)    BUN 24 (*)    Creatinine, Ser 1.38 (*)    AST 108 (*)    ALT 353 (*)    Total Bilirubin 1.8 (*)    GFR calc non Af Amer 55 (*)    All other components within normal limits  BRAIN NATRIURETIC PEPTIDE - Abnormal; Notable for the following components:   B Natriuretic Peptide 1,562.1 (*)    All other components within normal limits  TROPONIN I (HIGH SENSITIVITY) - Abnormal; Notable for the following components:   Troponin I (High Sensitivity) 27 (*)    All  other components within normal limits  TROPONIN I (HIGH SENSITIVITY) - Abnormal; Notable for the following components:   Troponin I (High Sensitivity) 26 (*)    All other components within normal limits  CBC WITH DIFFERENTIAL/PLATELET - Abnormal; Notable for the following components:   Hemoglobin 11.5 (*)    HCT 38.0 (*)    RDW 16.9 (*)    All other components within normal limits  COMPREHENSIVE METABOLIC PANEL - Abnormal; Notable for the following components:   CO2 20 (*)    Glucose, Bld 196 (*)    BUN 25 (*)    Creatinine, Ser 1.34 (*)    Albumin 3.3 (*)    AST 66 (*)    ALT 274 (*)    Total Bilirubin 1.3 (*)    GFR calc non Af Amer 57 (*)    All other components within normal limits  BILIRUBIN, FRACTIONATED(TOT/DIR/INDIR) - Abnormal; Notable for the following components:   Total Bilirubin 1.7 (*)    Bilirubin, Direct 0.4 (*)    Indirect Bilirubin 1.3 (*)    All other components within normal limits  GLUCOSE, CAPILLARY - Abnormal; Notable for the following components:    Glucose-Capillary 175 (*)    All other components within normal limits  GLUCOSE, CAPILLARY - Abnormal; Notable for the following components:   Glucose-Capillary 180 (*)    All other components within normal limits  GLUCOSE, CAPILLARY - Abnormal; Notable for the following components:   Glucose-Capillary 156 (*)    All other components within normal limits  SARS CORONAVIRUS 2 (HOSPITAL ORDER, [place] LAB)  RAPID URINE DRUG SCREEN, HOSP PERFORMED  HEPATITIS PANEL, ACUTE   ____________________________________________  EKG   EKG Interpretation  Date/Time:  Friday May 04 2019 15:42:51 EDT Ventricular Rate:  102 PR Interval:  164 QRS Duration: 108 QT Interval:  370 QTC Calculation: 482 R Axis:   90 Text Interpretation:  Sinus tachycardia with Fusion complexes Possible Left atrial enlargement Rightward axis Incomplete right bundle branch block T wave abnormality, consider anterior ischemia Abnormal ECG No STEMI  Confirmed by Nanda Quinton [phone]) on 05/04/2019 5:02:49 PM       ____________________________________________  RADIOLOGY  Dg Chest 2 View  Result Date: 05/04/2019 CLINICAL DATA:  Bilateral lower extremity swelling.  Orthopnea. EXAM: CHEST - 2 VIEW COMPARISON:  07/24/2018 FINDINGS: Stable surgical changes from coronary artery bypass surgery. The heart is mildly enlarged. The mediastinal and hilar contours are within normal limits and stable. There is moderate central vascular congestion, peribronchial thickening and increased interstitial markings suggesting interstitial pulmonary edema. No infiltrates or effusions. The bony thorax is intact. IMPRESSION: Mild CHF with interstitial pulmonary edema. Electronically Signed   By: Marijo Sanes M.D.   On: 05/04/2019 16:59    ____________________________________________   PROCEDURES  Procedure(s) performed:   Procedures  CRITICAL CARE Performed by: Margette Fast Total critical care time: 35  minutes Critical care time was exclusive of separately billable procedures and treating other patients. Critical care was necessary to treat or prevent imminent or life-threatening deterioration. Critical care was time spent personally by me on the following activities: development of treatment plan with patient and/or surrogate as well as nursing, discussions with consultants, evaluation of patient's response to treatment, examination of patient, obtaining history from patient or surrogate, ordering and performing treatments and interventions, ordering and review of laboratory studies, ordering and review of radiographic studies, pulse oximetry and re-evaluation of patient's condition.  Nanda Quinton, MD Emergency Medicine  ____________________________________________  INITIAL IMPRESSION / ASSESSMENT AND PLAN / ED COURSE  Pertinent labs & imaging results that were available during my care of the patient were reviewed by me and considered in my medical decision making (see chart for details).   Patient presents to the emergency department for evaluation of shortness of breath.  Patient describing PND type symptoms as well as exertional dyspnea.  He has bilateral lower extremity swelling.  Low suspicion for PE.  Patient has been poorly compliant with medications due to lack of money to afford them.  He states he has been taking some of his medicines but cannot recall exactly which ones.  He believes he has been taking his Lasix but cannot say for sure.  Afebrile here.  Exam most consistent with volume overload.  Sending COVID testing as well as screening lab work including troponin.  EKG with no acute ischemic changes.   06:00 PM  Patient more comfortable sitting up at bedside.  No acute respiratory distress but becomes significantly dyspneic with even standing to get back in bed.  BNP elevated to 1500 with pulmonary edema on chest x-ray.  Remaining lab work reviewed with no acute findings.  Troponin  29 but will need to trend.   Discussed patient's case with Hospitalist to request admission. Patient and family (if present) updated with plan. Care transferred to Hospitalist service.  I reviewed all nursing notes, vitals, pertinent old records, EKGs, labs, imaging (as available).  ____________________________________________  FINAL CLINICAL IMPRESSION(S) / ED DIAGNOSES  Final diagnoses:  SOB (shortness of breath)  Acute systolic congestive heart failure (HCC)     MEDICATIONS GIVEN DURING THIS VISIT:  Medications  aspirin tablet 325 mg (325 mg Oral Given 05/05/19 0838)  atorvastatin (LIPITOR) tablet 40 mg (has no administration in time range)  clopidogrel (PLAVIX) tablet 75 mg (75 mg Oral Given 05/05/19 0838)  sodium chloride flush (NS) 0.9 % injection 3 mL (3 mLs Intravenous Given 05/05/19 0839)  sodium chloride flush (NS) 0.9 % injection 3 mL (has no administration in time range)  0.9 %  sodium chloride infusion (has no administration in time range)  acetaminophen (TYLENOL) tablet 650 mg (has no administration in time range)  ondansetron (ZOFRAN) injection 4 mg (has no administration in time range)  enoxaparin (LOVENOX) injection 40 mg (40 mg Subcutaneous Given 05/04/19 2206)  furosemide (LASIX) injection 40 mg (40 mg Intravenous Given 05/05/19 0552)  insulin aspart (novoLOG) injection 0-9 Units (2 Units Subcutaneous Given 05/05/19 1100)  insulin aspart (novoLOG) injection 0-5 Units (0 Units Subcutaneous Not Given 05/04/19 2201)  carvedilol (COREG) tablet 6.25 mg (has no administration in time range)  furosemide (LASIX) injection 60 mg (60 mg Intravenous Given 05/04/19 1817)    Note:  This document was prepared using Dragon voice recognition software and may include unintentional dictation errors.  Alona BeneJoshua Edouard Gikas, MD Emergency Medicine    Jennings Stirling, Arlyss RepressJoshua G, MD 05/05/19 [phone]

## 2019-05-04 NOTE — H&P (Signed)
History and Physical    Robert Sanchez ZOX:[id number] DOB: 09/29/1956 DOA: 05/04/2019  PCP: Doristine BosworthStallings, Zoe A, MD   Patient coming from: Home   Chief Complaint: DOE, bilateral leg swelling, PND   HPI: Robert Sanchez is a 63 y.o. male with medical history significant for CAD status post CABG, PAD status post bypass involving the left leg, and type 2 diabetes mellitus, now presenting to the emergency department for evaluation of exertional dyspnea, paroxysmal nocturnal dyspnea, orthopnea, bilateral leg swelling.  Patient reports that he has been out of most of his home medications recently and has noted an insidious development of bilateral lower extremity edema.  Over the past few days, he has become dyspneic with just taking a few steps.  He also reports waking up gasping for air shortly after he falls asleep each night.  He denies any cough, fever, chills, or sick contacts.  He denies any chest pain.  ED Course: Upon arrival to the ED, patient is found to be afebrile, saturating mid 90s on room air, tachypneic to 30, slightly tachycardic, and with stable blood pressure.  EKG features sinus tachycardia with fusion complexes, incomplete RBBB, and T wave abnormalities.  Chest x-ray is consistent with mild CHF,  interstitial pulmonary edema noted.  Chemistry panel features a bicarbonate of 18, glucose 261, AST 108, ALT 353, total bilirubin 1.8, and creatinine 1.38, up from 1.19 in October.  COVID-19 screening test is negative.  CBC features a mild normocytic anemia.  Troponin is elevated to 27 and BNP elevated to 1562.  Patient was given 60 mg IV Lasix in the ED.  Hospitalists are asked to admit.  Review of Systems:  All other systems reviewed and apart from HPI, are negative.  Past Medical History:  Diagnosis Date  . Acute pulmonary edema (HCC) 06/05/2018  . Arthritis   . COPD (chronic obstructive pulmonary disease) (HCC)    " beginning stages " per pt  . Coronary artery disease   . Diabetes  mellitus without complication (HCC)   . Diabetic neuropathy (HCC)   . Diabetic neuropathy (HCC)   . Emphysema/COPD (HCC)    " beginning stages"  . GERD (gastroesophageal reflux disease)   . HOH (hard of hearing)   . Myocardial infarction (HCC)   . Peripheral vascular disease [facility](HCC)     Past Surgical History:  Procedure Laterality Date  . ABDOMINAL AORTOGRAM W/LOWER EXTREMITY N/A 06/21/2018   Procedure: ABDOMINAL AORTOGRAM W/LOWER EXTREMITY;  Surgeon: Cephus Shellinglark, Christopher J, MD;  Location: [facility];  Service: Cardiovascular;  Laterality: N/A;  . CORONARY ARTERY BYPASS GRAFT N/A 06/12/2018   Procedure: CORONARY ARTERY BYPASS GRAFTING (CABG) x 3 WITH ENDOSCOPIC HARVESTING OF RIGHT GREATER SAPHENOUS VEIN. LIMA TO LAD. SVG TO PD. SVG TO DIAGONAL.;  Surgeon: Kerin PernaVan Trigt, Peter, MD;  [facility];  Service: Open Heart Surgery;  Laterality: N/A;  . FEMORAL-TIBIAL BYPASS GRAFT Left 08/21/2018   Procedure: BYPASS GRAFT LEFT FEMORAL TO POSTERIOR TIBIAL ARTERY USING LEFT REVERSED GREAT SAPHENOUS VEIN;  Surgeon: Cephus Shellinglark, Christopher J, MD;  Location: [facility] OR;  Service: Vascular;  Laterality: Left;  . KNEE ARTHROSCOPY    . PERIPHERAL VASCULAR INTERVENTION Left 06/21/2018   Procedure: PERIPHERAL VASCULAR INTERVENTION;  Surgeon: Cephus Shellinglark, Christopher J, MD;  [facility];  Service: Cardiovascular;  Laterality: Left;  . RIGHT/LEFT HEART CATH AND CORONARY ANGIOGRAPHY N/A 06/05/2018   Procedure: RIGHT/LEFT HEART CATH AND CORONARY ANGIOGRAPHY;  Surgeon: Orpah CobbKadakia, Ajay, MD;  Location: [facility];  Service: Cardiovascular;  Laterality: N/A;  . SPINE SURGERY    . TEE WITHOUT CARDIOVERSION N/A 06/12/2018   Procedure: TRANSESOPHAGEAL ECHOCARDIOGRAM (TEE);  Surgeon: Prescott Gum, Collier Salina, MD;  Location: [place];  Service: Open Heart Surgery;  Laterality: N/A;  . teeth extractions    . VEIN HARVEST Left 08/21/2018   Procedure: VEIN HARVEST LEFT GREAT SAPHENOUS VEIN;  Surgeon: [name], MD;   Location: [place];  Service: Vascular;  Laterality: Left;     reports that he quit smoking about 11 months ago. He has a 40.00 pack-year smoking history. He has never used smokeless tobacco. He reports previous alcohol use. He reports previous drug use.  Allergies  Allergen Reactions  . Lisinopril Other (See Comments)    Syncope  . Codeine Rash    Family History  Problem Relation Age of Onset  . Diabetes Mother   . Lung disease Mother   . Cancer Father   . Heart disease Father      Prior to Admission medications   Medication Sig Start Date End Date Taking? Authorizing Provider  aspirin EC 325 MG EC tablet Take 1 tablet (325 mg total) by mouth daily. 06/20/18  Yes Barrett, Erin R, PA-C  metFORMIN (GLUCOPHAGE) 1000 MG tablet Take 1,000 mg by mouth 2 (two) times daily before a meal.    Yes [provider]  amiodarone (PACERONE) 400 MG tablet Take 1 tablet (400 mg total) by mouth 2 (two) times daily. For 7 days, then decrease to 400 mg daily Patient not taking: Reported on 05/04/2019 06/19/18   Barrett, Lodema Hong, PA-C  atorvastatin (LIPITOR) 40 MG tablet Take 1 tablet (40 mg total) by mouth daily at 6 PM. 08/29/18   Forrest Moron, MD  carvedilol (COREG) 12.5 MG tablet Take 1 tablet (12.5 mg total) by mouth 2 (two) times daily with a meal. 08/11/18   Delia Chimes A, MD  clopidogrel (PLAVIX) 75 MG tablet Take 1 tablet (75 mg total) by mouth daily. 08/29/18 08/29/19  Forrest Moron, MD  furosemide (LASIX) 40 MG tablet Take 1 tablet (40 mg total) by mouth daily. 08/29/18   Forrest Moron, MD  glipiZIDE (GLUCOTROL) 5 MG tablet Take 1 tablet (5 mg total) by mouth daily before breakfast. 08/11/18   Forrest Moron, MD  oxyCODONE-acetaminophen (PERCOCET/ROXICET) 5-325 MG tablet Take 1 tablet by mouth every 6 (six) hours as needed for moderate pain. Patient not taking: Reported on 05/04/2019 08/24/18   Gabriel Earing, PA-C  potassium chloride SA (K-DUR,KLOR-CON) 20 MEQ tablet Take 1  tablet (20 mEq total) by mouth daily. 08/29/18   Forrest Moron, MD    Physical Exam: Vitals:   05/04/19 1730 05/04/19 1745 05/04/19 1800 05/04/19 1815  BP: (!) 151/94 (!) 151/89 (!) 141/86 (!) 154/93  Pulse: 98 (!) 102 100 99  Resp: (!) 28 (!) 30 (!) 27 (!) 23  Temp:      TempSrc:      SpO2: 97% 93% 95% 94%    Constitutional: NAD, calm  Eyes: PERTLA, lids and conjunctivae normal ENMT: Mucous membranes are moist. Posterior pharynx clear of any exudate or lesions.   Neck: normal, supple, no masses, no thyromegaly Respiratory: Rales bilaterally. Mild tachypnea. No accessory muscle use.  Cardiovascular: S1 & S2 heard, regular rate and rhythm. Pretibial pitting edema bilaterally. Abdomen: No distension, no tenderness, soft. Bowel sounds active.  Musculoskeletal: no clubbing / cyanosis. No joint deformity upper and lower extremities.   Skin: no significant  rashes, lesions, ulcers. Warm, dry, well-perfused. Neurologic: CN 2-12 grossly intact. Sensation intact. Strength 5/5 in all 4 limbs.  Psychiatric: Alert and oriented x 3. Pleasant, cooperative.    Labs on Admission: I have personally reviewed following labs and imaging studies  CBC: Recent Labs  Lab 05/04/19 1658  WBC 9.4  NEUTROABS 7.0  HGB 11.5*  HCT 38.0*  MCV 89.8  PLT 345   Basic Metabolic Panel: Recent Labs  Lab 05/04/19 1658  NA 137  K 4.5  CL 108  CO2 18*  GLUCOSE 261*  BUN 24*  CREATININE 1.38*  CALCIUM 9.0   GFR: CrCl cannot be calculated (Unknown ideal weight.). Liver Function Tests: Recent Labs  Lab 05/04/19 1658  AST 108*  ALT 353*  ALKPHOS 64  BILITOT 1.8*  PROT 8.1  ALBUMIN 3.6   No results for input(s): LIPASE, AMYLASE in the last 168 hours. No results for input(s): AMMONIA in the last 168 hours. Coagulation Profile: No results for input(s): INR, PROTIME in the last 168 hours. Cardiac Enzymes: No results for input(s): CKTOTAL, CKMB, CKMBINDEX, TROPONINI in the last 168 hours. BNP  (last 3 results) No results for input(s): PROBNP in the last 8760 hours. HbA1C: No results for input(s): HGBA1C in the last 72 hours. CBG: No results for input(s): GLUCAP in the last 168 hours. Lipid Profile: No results for input(s): CHOL, HDL, LDLCALC, TRIG, CHOLHDL, LDLDIRECT in the last 72 hours. Thyroid Function Tests: No results for input(s): TSH, T4TOTAL, FREET4, T3FREE, THYROIDAB in the last 72 hours. Anemia Panel: No results for input(s): VITAMINB12, FOLATE, FERRITIN, TIBC, IRON, RETICCTPCT in the last 72 hours. Urine analysis:    Component Value Date/Time   COLORURINE AMBER (A) 08/17/2018 1400   APPEARANCEUR TURBID (A) 08/17/2018 1400   LABSPEC 1.020 08/17/2018 1400   PHURINE 5.0 08/17/2018 1400   GLUCOSEU NEGATIVE 08/17/2018 1400   HGBUR NEGATIVE 08/17/2018 1400   BILIRUBINUR NEGATIVE 08/17/2018 1400   KETONESUR NEGATIVE 08/17/2018 1400   PROTEINUR NEGATIVE 08/17/2018 1400   NITRITE NEGATIVE 08/17/2018 1400   LEUKOCYTESUR NEGATIVE 08/17/2018 1400   Sepsis Labs: @LABRCNTIP (procalcitonin:4,lacticidven:4) ) Recent Results (from the past 240 hour(s))  SARS Coronavirus 2 (CEPHEID - [facility]), Hosp Order     Status: None   Collection Time: 05/04/19  4:58 PM   Specimen: Nasopharyngeal Swab  Result Value Ref Range Status   SARS Coronavirus 2 NEGATIVE NEGATIVE Final    Comment: (NOTE) If result is NEGATIVE SARS-CoV-2 target nucleic acids are NOT DETECTED. The SARS-CoV-2 RNA is generally detectable in upper and lower  respiratory specimens during the acute phase of infection. The lowest  concentration of SARS-CoV-2 viral copies this assay can detect is 250  copies / mL. A negative result does not preclude SARS-CoV-2 infection  and should not be used as the sole basis for treatment or other  patient management decisions.  A negative result may occur with  improper specimen collection / handling, submission of specimen other  than  nasopharyngeal swab, presence of viral mutation(s) within the  areas targeted by this assay, and inadequate number of viral copies  (<250 copies / mL). A negative result must be combined with clinical  observations, patient history, and epidemiological information. If result is POSITIVE SARS-CoV-2 target nucleic acids are DETECTED. The SARS-CoV-2 RNA is generally detectable in upper and lower  respiratory specimens dur ing the acute phase of infection.  Positive  results are indicative of active infection with SARS-CoV-2.  Clinical  correlation  with patient history and other diagnostic information is  necessary to determine patient infection status.  Positive results do  not rule out bacterial infection or co-infection with other viruses. If result is PRESUMPTIVE POSTIVE SARS-CoV-2 nucleic acids MAY BE PRESENT.   A presumptive positive result was obtained on the submitted specimen  and confirmed on repeat testing.  While 2019 novel coronavirus  (SARS-CoV-2) nucleic acids may be present in the submitted sample  additional confirmatory testing may be necessary for epidemiological  and / or clinical management purposes  to differentiate between  SARS-CoV-2 and other Sarbecovirus currently known to infect humans.  If clinically indicated additional testing with an alternate test  methodology [phone] is advised. The SARS-CoV-2 RNA is generally  detectable in upper and lower respiratory sp ecimens during the acute  phase of infection. The expected result is Negative. Fact Sheet for Patients:  BoilerBrush.com.cyhttps://www.fda.gov/media/136312/download Fact Sheet for Healthcare Providers: https://pope.com/https://www.fda.gov/media/136313/download This test is not yet approved or cleared by the [place] FDA and has been authorized for detection and/or diagnosis of SARS-CoV-2 by FDA under an Emergency Use Authorization (EUA).  This EUA will remain in effect (meaning this test can be used) for the duration of the  COVID-19 declaration under Section 564(b)(1) of the Act, 21 U.S.C. section 360bbb-3(b)(1), unless the authorization is terminated or revoked sooner. Performed at [facility], [address] [place], [place] [address]      Radiological Exams on Admission: Dg Chest 2 View  Result Date: 05/04/2019 CLINICAL DATA:  Bilateral lower extremity swelling.  Orthopnea. EXAM: CHEST - 2 VIEW COMPARISON:  07/24/2018 FINDINGS: Stable surgical changes from coronary artery bypass surgery. The heart is mildly enlarged. The mediastinal and hilar contours are within normal limits and stable. There is moderate central vascular congestion, peribronchial thickening and increased interstitial markings suggesting interstitial pulmonary edema. No infiltrates or effusions. The bony thorax is intact. IMPRESSION: Mild CHF with interstitial pulmonary edema. Electronically Signed   By: Rudie MeyerP.  Gallerani M.D.   On: 05/04/2019 16:59    EKG: Independently reviewed. Sinus tachycardia with fusion complexes, rate 102, incomplete RBBB, T-wave abnormalities in lead III and precordial leads.   Assessment/Plan   1. Acute on chronic systolic CHF  - Presents with bilateral leg swelling, DOE, orthopnea, and PND  - Found to have elevated BNP and CXR findings consistent with acute CHF  - EF 25-30% in August 2019  - This is likely secondary to non-compliance with Lasix at home - Diurese with Lasix 40 mg IV q12h, continue Coreg as tolerated, follow daily wt and I/O's    2. CAD  - No anginal complaints  - There is a mild non-specific troponin elevation in ED, likely related to acute CHF and kidney disease rather than ACS, delta trop is pending and if rises >20% may need consider ACS  - Continue ASA, Plavix, statin, beta-blocker   3. CKD III  - SCr is 1.38 on admission, similar to priors  - Renally-dose medications, monitor renal function during diuresis    4. Abnormal LFT's  - AST 108, ALT 353, t bili 1.8 on admission, normal  in October 2019  - No abd pain or GI complaints   - Fractionate bili, check for viral hepatitis, repeat LFT's in am   5. Type II DM  - A1c was 7.8% in October 2019  - Managed with glipizide and metformin at home, held on admission  - Use SSI with Novolog while in hospital    6. PAD   -  Status-post left femoral-tibial bypass  - No acute ischemia, continue ASA, Plavix, statin    DVT prophylaxis: Lovenox  Code Status: Full  Family Communication: Discussed with patient  Consults called: None  Admission status: Observation    Briscoe Deutscher, MD [facility] Pager [phone]  If 7PM-7AM, please contact night-coverage [email or web address] Password [facility]  05/04/2019, 7:34 PM

## 2019-05-04 NOTE — ED Notes (Signed)
Pt not in room.

## 2019-05-04 NOTE — ED Notes (Signed)
Pt in xray

## 2019-05-05 DIAGNOSIS — N289 Disorder of kidney and ureter, unspecified: Secondary | ICD-10-CM

## 2019-05-05 DIAGNOSIS — E119 Type 2 diabetes mellitus without complications: Secondary | ICD-10-CM

## 2019-05-05 DIAGNOSIS — I5023 Acute on chronic systolic (congestive) heart failure: Principal | ICD-10-CM

## 2019-05-05 LAB — COMPREHENSIVE METABOLIC PANEL
ALT: 274 U/L — ABNORMAL HIGH (ref 0–44)
AST: 66 U/L — ABNORMAL HIGH (ref 15–41)
Albumin: 3.3 g/dL — ABNORMAL LOW (ref 3.5–5.0)
Alkaline Phosphatase: 62 U/L (ref 38–126)
Anion gap: 10 (ref 5–15)
BUN: 25 mg/dL — ABNORMAL HIGH (ref 8–23)
CO2: 20 mmol/L — ABNORMAL LOW (ref 22–32)
Calcium: 8.9 mg/dL (ref 8.9–10.3)
Chloride: 106 mmol/L (ref 98–111)
Creatinine, Ser: 1.34 mg/dL — ABNORMAL HIGH (ref 0.61–1.24)
GFR calc Af Amer: >60 mL/min (ref >60)
GFR calc non Af Amer: 57 mL/min — ABNORMAL LOW (ref >60)
Glucose, Bld: 196 mg/dL — ABNORMAL HIGH (ref 70–99)
Potassium: 5.1 mmol/L (ref 3.5–5.1)
Sodium: 136 mmol/L (ref 135–145)
Total Bilirubin: 1.3 mg/dL — ABNORMAL HIGH (ref 0.3–1.2)
Total Protein: 7.3 g/dL (ref 6.5–8.1)

## 2019-05-05 LAB — GLUCOSE, CAPILLARY
Glucose-Capillary: 180 mg/dL — ABNORMAL HIGH (ref 70–99)
Glucose-Capillary: 156 mg/dL — ABNORMAL HIGH (ref 70–99)
Glucose-Capillary: 169 mg/dL — ABNORMAL HIGH (ref 70–99)
Glucose-Capillary: 203 mg/dL — ABNORMAL HIGH (ref 70–99)

## 2019-05-05 LAB — RAPID URINE DRUG SCREEN, HOSP PERFORMED
Amphetamines: NOT DETECTED
Barbiturates: NOT DETECTED
Benzodiazepines: NOT DETECTED
Cocaine: NOT DETECTED
Opiates: NOT DETECTED
Tetrahydrocannabinol: NOT DETECTED

## 2019-05-05 MED ORDER — CARVEDILOL 6.25 MG PO TABS
6.2500 mg | ORAL_TABLET | Freq: Two times a day (BID) | ORAL | Status: DC
  Administered 2019-05-05 – 2019-05-06 (×2): 6.25 mg via ORAL
  Filled 2019-05-05 (×2): qty 1

## 2019-05-05 NOTE — Progress Notes (Signed)
Progress Note    Robert Sanchez  RUE:[id number] DOB: 10-25-1956  DOA: 05/04/2019 PCP: Doristine BosworthStallings, Zoe A, MD    Brief Narrative:     Medical records reviewed and are as summarized below:  Robert Sanchez is an 63 y.o. male with medical history significant for CAD status post CABG, PAD status post bypass involving the left leg, and type 2 diabetes mellitus, now presenting to the emergency department for evaluation of exertional dyspnea, paroxysmal nocturnal dyspnea, orthopnea, bilateral leg swelling.  Patient reports that he has been out of most of his home medications recently and has noted an insidious development of bilateral lower extremity edema.   Assessment/Plan:   Principal Problem:   Acute on chronic systolic CHF (congestive heart failure) (HCC) Active Problems:   Diabetes mellitus type II, non insulin dependent (HCC)   PAD (peripheral artery disease) (HCC)   Mild renal insufficiency   LFT elevation  1. Acute on chronic systolic CHF  - Presents with bilateral leg swelling, DOE, orthopnea, and PND  - Found to have elevated BNP and CXR findings consistent with acute CHF  - EF 25-30% in August 2019  - This is likely secondary to non-compliance with Lasix at home - Diurese with Lasix 40 mg IV q12h, continue Coreg as tolerated, follow daily wt and I/O's    2. CAD  - No anginal complaints  - flat, most likely demand ischemia  3. CKD III  - SCr is 1.38 on admission, similar to priors  - Renally-dose medications, monitor renal function during diuresis    4. Abnormal LFT's  -trend but probably volume overload  5. Type II DM  - A1c was 7.8% in October 2019  - Managed with glipizide and metformin at home, held on admission  - Use SSI with Novolog while in hospital    6. PAD   - Status-post left femoral-tibial bypass  - No acute ischemia, continue ASA, Plavix, statin   7.  obesity Body mass index is 31.5 kg/m.   Family Communication/Anticipated D/C date  and plan/Code Status   DVT prophylaxis: Lovenox ordered. Code Status: Full Code.  Family Communication:  Disposition Plan:    Medical Consultants:    None.     Subjective:   Able to sleep w/o waking up feeling like he is smothering  Objective:    Vitals:   05/05/19 0112 05/05/19 0437 05/05/19 0727 05/05/19 0836  BP: 112/79 112/69 107/65 121/71  Pulse: 78 75 77 77  Resp: 20 18 18    Temp: 98.6 F (37 C) (!) 97.2 F (36.2 C) 97.6 F (36.4 C) 98.2 F (36.8 C)  TempSrc: Oral  Oral Oral  SpO2: 100% 100% 96% 99%  Weight:  94 kg    Height:        Intake/Output Summary (Last 24 hours) at 05/05/2019 1107 Last data filed at 05/05/2019 0906 Gross per 24 hour  Intake 720 ml  Output 1400 ml  Net -680 ml   Filed Weights   05/04/19 2059 05/05/19 0437  Weight: 96.1 kg 94 kg    Exam: In bed, sleeping Appear comfortable but not laying flat +LE edema No increased work of breathing, crackles at bases   Data Reviewed:   I have personally reviewed following labs and imaging studies:  Labs: Labs show the following:   Basic Metabolic Panel: Recent Labs  Lab 05/04/19 1658 05/05/19 0433  NA 137 136  K 4.5 5.1  CL 108 106  CO2 18* 20*  GLUCOSE 261* 196*  BUN 24* 25*  CREATININE 1.38* 1.34*  CALCIUM 9.0 8.9   GFR Estimated Creatinine Clearance: 64.4 mL/min (A) (by C-G formula based on SCr of 1.34 mg/dL (H)). Liver Function Tests: Recent Labs  Lab 05/04/19 1658 05/04/19 2128 05/05/19 0433  AST 108*  --  66*  ALT 353*  --  274*  ALKPHOS 64  --  62  BILITOT 1.8* 1.7* 1.3*  PROT 8.1  --  7.3  ALBUMIN 3.6  --  3.3*   No results for input(s): LIPASE, AMYLASE in the last 168 hours. No results for input(s): AMMONIA in the last 168 hours. Coagulation profile No results for input(s): INR, PROTIME in the last 168 hours.  CBC: Recent Labs  Lab 05/04/19 1658  WBC 9.4  NEUTROABS 7.0  HGB 11.5*  HCT 38.0*  MCV 89.8  PLT 345   Cardiac Enzymes: No results  for input(s): CKTOTAL, CKMB, CKMBINDEX, TROPONINI in the last 168 hours. BNP (last 3 results) No results for input(s): PROBNP in the last 8760 hours. CBG: Recent Labs  Lab 05/04/19 2153 05/05/19 0626  GLUCAP 175* 180*   D-Dimer: No results for input(s): DDIMER in the last 72 hours. Hgb A1c: No results for input(s): HGBA1C in the last 72 hours. Lipid Profile: No results for input(s): CHOL, HDL, LDLCALC, TRIG, CHOLHDL, LDLDIRECT in the last 72 hours. Thyroid function studies: No results for input(s): TSH, T4TOTAL, T3FREE, THYROIDAB in the last 72 hours.  Invalid input(s): FREET3 Anemia work up: No results for input(s): VITAMINB12, FOLATE, FERRITIN, TIBC, IRON, RETICCTPCT in the last 72 hours. Sepsis Labs: Recent Labs  Lab 05/04/19 1658  WBC 9.4    Microbiology Recent Results (from the past 240 hour(s))  SARS Coronavirus 2 (CEPHEID - Performed in [place] hospital lab), Hosp Order     Status: None   Collection Time: 05/04/19  4:58 PM   Specimen: Nasopharyngeal Swab  Result Value Ref Range Status   SARS Coronavirus 2 NEGATIVE NEGATIVE Final    Comment: (NOTE) If result is NEGATIVE SARS-CoV-2 target nucleic acids are NOT DETECTED. The SARS-CoV-2 RNA is generally detectable in upper and lower  respiratory specimens during the acute phase of infection. The lowest  concentration of SARS-CoV-2 viral copies this assay can detect is 250  copies / mL. A negative result does not preclude SARS-CoV-2 infection  and should not be used as the sole basis for treatment or other  patient management decisions.  A negative result may occur with  improper specimen collection / handling, submission of specimen other  than nasopharyngeal swab, presence of viral mutation(s) within the  areas targeted by this assay, and inadequate number of viral copies  (<250 copies / mL). A negative result must be combined with clinical  observations, patient history, and epidemiological information. If  result is POSITIVE SARS-CoV-2 target nucleic acids are DETECTED. The SARS-CoV-2 RNA is generally detectable in upper and lower  respiratory specimens dur ing the acute phase of infection.  Positive  results are indicative of active infection with SARS-CoV-2.  Clinical  correlation with patient history and other diagnostic information is  necessary to determine patient infection status.  Positive results do  not rule out bacterial infection or co-infection with other viruses. If result is PRESUMPTIVE POSTIVE SARS-CoV-2 nucleic acids MAY BE PRESENT.   A presumptive positive result was obtained on the submitted specimen  and confirmed on repeat testing.  While 2019 novel coronavirus  (SARS-CoV-2) nucleic acids may be present in the  submitted sample  additional confirmatory testing may be necessary for epidemiological  and / or clinical management purposes  to differentiate between  SARS-CoV-2 and other Sarbecovirus currently known to infect humans.  If clinically indicated additional testing with an alternate test  methodology [phone]([id number]) is advised. The SARS-CoV-2 RNA is generally  detectable in upper and lower respiratory sp ecimens during the acute  phase of infection. The expected result is Negative. Fact Sheet for Patients:  BoilerBrush.com.cyhttps://www.fda.gov/media/136312/download Fact Sheet for Healthcare Providers: https://pope.com/https://www.fda.gov/media/136313/download This test is not yet approved or cleared by the [place] FDA and has been authorized for detection and/or diagnosis of SARS-CoV-2 by FDA under an Emergency Use Authorization (EUA).  This EUA will remain in effect (meaning this test can be used) for the duration of the COVID-19 declaration under Section 564(b)(1) of the Act, 21 U.S.C. section 360bbb-3(b)(1), unless the authorization is terminated or revoked sooner. Performed at [facility], [address] [place], [place] [address]     Procedures and diagnostic studies:  Dg  Chest 2 View  Result Date: 05/04/2019 CLINICAL DATA:  Bilateral lower extremity swelling.  Orthopnea. EXAM: CHEST - 2 VIEW COMPARISON:  07/24/2018 FINDINGS: Stable surgical changes from coronary artery bypass surgery. The heart is mildly enlarged. The mediastinal and hilar contours are within normal limits and stable. There is moderate central vascular congestion, peribronchial thickening and increased interstitial markings suggesting interstitial pulmonary edema. No infiltrates or effusions. The bony thorax is intact. IMPRESSION: Mild CHF with interstitial pulmonary edema. Electronically Signed   By: Rudie MeyerP.  Gallerani M.D.   On: 05/04/2019 16:59    Medications:    aspirin  325 mg Oral Daily   atorvastatin  40 mg Oral q1800   carvedilol  12.5 mg Oral BID WC   clopidogrel  75 mg Oral Daily   enoxaparin (LOVENOX) injection  40 mg Subcutaneous Q24H   furosemide  40 mg Intravenous Q12H   insulin aspart  0-5 Units Subcutaneous QHS   insulin aspart  0-9 Units Subcutaneous TID WC   sodium chloride flush  3 mL Intravenous Q12H   Continuous Infusions:  sodium chloride       LOS: 0 days   Joseph ArtJessica U Japji Kok  [facility]   How to contact the [facility] Attending or Consulting provider 7A - 7P or covering provider during after hours 7P -7A, for this patient?  1. Check the care team in [facility] and look for a) attending/consulting [facility] provider listed and b) the [facility] team listed 2. Log into [email or web address] and use [place]'s universal password to access. If you do not have the password, please contact the hospital operator. 3. Locate the [facility] provider you are looking for under [facility] and page to a number that you can be directly reached. 4. If you still have difficulty reaching the provider, please page the [facility] (Director on Call) for the Hospitalists listed on amion for assistance.  05/05/2019, 11:07 AM

## 2019-05-05 NOTE — Plan of Care (Signed)
  Problem: Education: Goal: Knowledge of General Education information will improve Description Including pain rating scale, medication(s)/side effects and non-pharmacologic comfort measures Outcome: Progressing   

## 2019-05-05 NOTE — Plan of Care (Signed)
  Problem: Education: Goal: Knowledge of General Education information will improve Description: Including pain rating scale, medication(s)/side effects and non-pharmacologic comfort measures Outcome: Progressing   Problem: Health Behavior/Discharge Planning: Goal: Ability to manage health-related needs will improve Outcome: Progressing   Problem: Clinical Measurements: Goal: Will remain free from infection Outcome: Progressing Goal: Respiratory complications will improve Outcome: Progressing   Problem: Activity: Goal: Risk for activity intolerance will decrease Outcome: Progressing   Problem: Nutrition: Goal: Adequate nutrition will be maintained Outcome: Progressing   

## 2019-05-06 LAB — GLUCOSE, CAPILLARY
Glucose-Capillary: 155 mg/dL — ABNORMAL HIGH (ref 70–99)
Glucose-Capillary: 191 mg/dL — ABNORMAL HIGH (ref 70–99)

## 2019-05-06 LAB — COMPREHENSIVE METABOLIC PANEL
ALT: 191 U/L — ABNORMAL HIGH (ref 0–44)
AST: 27 U/L (ref 15–41)
Albumin: 3.1 g/dL — ABNORMAL LOW (ref 3.5–5.0)
Alkaline Phosphatase: 57 U/L (ref 38–126)
Anion gap: 10 (ref 5–15)
BUN: 31 mg/dL — ABNORMAL HIGH (ref 8–23)
CO2: 24 mmol/L (ref 22–32)
Calcium: 8.8 mg/dL — ABNORMAL LOW (ref 8.9–10.3)
Chloride: 103 mmol/L (ref 98–111)
Creatinine, Ser: 1.33 mg/dL — ABNORMAL HIGH (ref 0.61–1.24)
GFR calc Af Amer: >60 mL/min (ref >60)
GFR calc non Af Amer: 57 mL/min — ABNORMAL LOW (ref >60)
Glucose, Bld: 134 mg/dL — ABNORMAL HIGH (ref 70–99)
Potassium: 4.5 mmol/L (ref 3.5–5.1)
Sodium: 137 mmol/L (ref 135–145)
Total Bilirubin: 1 mg/dL (ref 0.3–1.2)
Total Protein: 7.3 g/dL (ref 6.5–8.1)

## 2019-05-06 MED ORDER — CARVEDILOL 6.25 MG PO TABS: 6.2500 mg | ORAL_TABLET | Freq: Two times a day (BID) | ORAL | 1 refills | Status: DC

## 2019-05-06 MED ORDER — POTASSIUM CHLORIDE CRYS ER 20 MEQ PO TBCR: 20.0000 meq | EXTENDED_RELEASE_TABLET | Freq: Every day | ORAL | 1 refills | Status: DC

## 2019-05-06 MED ORDER — FUROSEMIDE 40 MG PO TABS: 40.0000 mg | ORAL_TABLET | Freq: Every day | ORAL | 1 refills | Status: DC

## 2019-05-06 MED ORDER — ATORVASTATIN CALCIUM 40 MG PO TABS: 40.0000 mg | ORAL_TABLET | Freq: Every day | ORAL | 1 refills | Status: DC

## 2019-05-06 MED ORDER — GLIPIZIDE 5 MG PO TABS: 5.0000 mg | ORAL_TABLET | Freq: Every day | ORAL | 1 refills | Status: DC

## 2019-05-06 MED ORDER — CLOPIDOGREL BISULFATE 75 MG PO TABS: 75.0000 mg | ORAL_TABLET | Freq: Every day | ORAL | 1 refills | Status: DC

## 2019-05-06 NOTE — Discharge Summary (Signed)
Physician Discharge Summary  Robert Sanchez RDE:[id number] DOB: 1956-10-23 DOA: 05/04/2019  PCP: Forrest Moron, MD  Admit date: 05/04/2019 Discharge date: 05/06/2019  Admitted From: home Discharge disposition: home   Recommendations for Outpatient Follow-Up:   1. Has not taken or filled medications since October/April 2. CBC. BMP 1 week   Discharge Diagnosis:   Principal Problem:   Acute on chronic systolic CHF (congestive heart failure) (HCC) Active Problems:   Diabetes mellitus type II, non insulin dependent (HCC)   PAD (peripheral artery disease) (HCC)   Mild renal insufficiency   LFT elevation    Discharge Condition: Improved.  Diet recommendation: Low sodium, heart healthy.  Carbohydrate-modified.  Wound care: None.  Code status: Full.   History of Present Illness:   Robert Sanchez is a 63 y.o. male with medical history significant for CAD status post CABG, PAD status post bypass involving the left leg, and type 2 diabetes mellitus, now presenting to the emergency department for evaluation of exertional dyspnea, paroxysmal nocturnal dyspnea, orthopnea, bilateral leg swelling.  Patient reports that he has been out of most of his home medications recently and has noted an insidious development of bilateral lower extremity edema.  Over the past few days, he has become dyspneic with just taking a few steps.  He also reports waking up gasping for air shortly after he falls asleep each night.  He denies any cough, fever, chills, or sick contacts.  He denies any chest pain.   Hospital Course by Problem:   1.Acute on chronic systolic CHF -Presents with bilateral leg swelling, DOE, orthopnea, and PND -Found to have elevated BNP and CXR findings consistent with acute CHF -EF 25-30% in August 2019 -This is likely secondary to non-compliance with Lasix at home -Diurese with Lasix 40 mg IV q12h- down 1.6L-- able to lay flat, feels much better and wants to  go home  2.CAD -No anginal complaints -flat, most likely demand ischemia  3.CKD III -SCr is 1.38 on admission, similar to priors -Renally-dose medications, monitor renal function during diuresis  4.Abnormal LFT's -trend but probably from volume overload  5.Type II DM -A1c was 7.8% in October 2019 -Managed with glipizide  6.PAD -Status-post left femoral-tibial bypass -No acute ischemia, continue ASA, Plavix, statin  7.  obesity Body mass index is 31.5 kg/m.    Medical Consultants:      Discharge Exam:   Vitals:   05/06/19 0549 05/06/19 1000  BP: 120/78 (!) 109/96  Pulse: 81 74  Resp: 18   Temp: 97.9 F (36.6 C) 97.8 F (36.6 C)  SpO2: 95% 97%   Vitals:   05/05/19 1653 05/05/19 2037 05/06/19 0549 05/06/19 1000  BP: 109/71 109/62 120/78 (!) 109/96  Pulse: 73 75 81 74  Resp:  19 18   Temp: 97.7 F (36.5 C) 97.6 F (36.4 C) 97.9 F (36.6 C) 97.8 F (36.6 C)  TempSrc: Oral Oral Oral Oral  SpO2: 98% 95% 95% 97%  Weight:   92.9 kg   Height:        General exam: Appears calm and comfortable. Anxious to go home   The results of significant diagnostics from this hospitalization (including imaging, microbiology, ancillary and laboratory) are listed below for reference.     Procedures and Diagnostic Studies:   Dg Chest 2 View  Result Date: 05/04/2019 CLINICAL DATA:  Bilateral lower extremity swelling.  Orthopnea. EXAM: CHEST - 2 VIEW COMPARISON:  07/24/2018 FINDINGS: Stable surgical changes from coronary  artery bypass surgery. The heart is mildly enlarged. The mediastinal and hilar contours are within normal limits and stable. There is moderate central vascular congestion, peribronchial thickening and increased interstitial markings suggesting interstitial pulmonary edema. No infiltrates or effusions. The bony thorax is intact. IMPRESSION: Mild CHF with interstitial pulmonary edema. Electronically Signed   By: Rudie MeyerP.  Gallerani M.D.    On: 05/04/2019 16:59     Labs:   Basic Metabolic Panel: Recent Labs  Lab 05/04/19 1658 05/05/19 0433 05/06/19 0402  NA 137 136 137  K 4.5 5.1 4.5  CL 108 106 103  CO2 18* 20* 24  GLUCOSE 261* 196* 134*  BUN 24* 25* 31*  CREATININE 1.38* 1.34* 1.33*  CALCIUM 9.0 8.9 8.8*   GFR Estimated Creatinine Clearance: 64.5 mL/min (A) (by C-G formula based on SCr of 1.33 mg/dL (H)). Liver Function Tests: Recent Labs  Lab 05/04/19 1658 05/04/19 2128 05/05/19 0433 05/06/19 0402  AST 108*  --  66* 27  ALT 353*  --  274* 191*  ALKPHOS 64  --  62 57  BILITOT 1.8* 1.7* 1.3* 1.0  PROT 8.1  --  7.3 7.3  ALBUMIN 3.6  --  3.3* 3.1*   No results for input(s): LIPASE, AMYLASE in the last 168 hours. No results for input(s): AMMONIA in the last 168 hours. Coagulation profile No results for input(s): INR, PROTIME in the last 168 hours.  CBC: Recent Labs  Lab 05/04/19 1658  WBC 9.4  NEUTROABS 7.0  HGB 11.5*  HCT 38.0*  MCV 89.8  PLT 345   Cardiac Enzymes: No results for input(s): CKTOTAL, CKMB, CKMBINDEX, TROPONINI in the last 168 hours. BNP: Invalid input(s): POCBNP CBG: Recent Labs  Lab 05/05/19 0626 05/05/19 1148 05/05/19 1612 05/05/19 2111 05/06/19 0650  GLUCAP 180* 156* 169* 203* 155*   D-Dimer No results for input(s): DDIMER in the last 72 hours. Hgb A1c No results for input(s): HGBA1C in the last 72 hours. Lipid Profile No results for input(s): CHOL, HDL, LDLCALC, TRIG, CHOLHDL, LDLDIRECT in the last 72 hours. Thyroid function studies No results for input(s): TSH, T4TOTAL, T3FREE, THYROIDAB in the last 72 hours.  Invalid input(s): FREET3 Anemia work up No results for input(s): VITAMINB12, FOLATE, FERRITIN, TIBC, IRON, RETICCTPCT in the last 72 hours. Microbiology Recent Results (from the past 240 hour(s))  SARS Coronavirus 2 (CEPHEID - Performed in [facility] lab), Hosp Order     Status: None   Collection Time: 05/04/19  4:58 PM   Specimen:  Nasopharyngeal Swab  Result Value Ref Range Status   SARS Coronavirus 2 NEGATIVE NEGATIVE Final    Comment: (NOTE) If result is NEGATIVE SARS-CoV-2 target nucleic acids are NOT DETECTED. The SARS-CoV-2 RNA is generally detectable in upper and lower  respiratory specimens during the acute phase of infection. The lowest  concentration of SARS-CoV-2 viral copies this assay can detect is 250  copies / mL. A negative result does not preclude SARS-CoV-2 infection  and should not be used as the sole basis for treatment or other  patient management decisions.  A negative result may occur with  improper specimen collection / handling, submission of specimen other  than nasopharyngeal swab, presence of viral mutation(s) within the  areas targeted by this assay, and inadequate number of viral copies  (<250 copies / mL). A negative result must be combined with clinical  observations, patient history, and epidemiological information. If result is POSITIVE SARS-CoV-2 target nucleic acids are DETECTED. The SARS-CoV-2 RNA is generally  detectable in upper and lower  respiratory specimens dur ing the acute phase of infection.  Positive  results are indicative of active infection with SARS-CoV-2.  Clinical  correlation with patient history and other diagnostic information is  necessary to determine patient infection status.  Positive results do  not rule out bacterial infection or co-infection with other viruses. If result is PRESUMPTIVE POSTIVE SARS-CoV-2 nucleic acids MAY BE PRESENT.   A presumptive positive result was obtained on the submitted specimen  and confirmed on repeat testing.  While 2019 novel coronavirus  (SARS-CoV-2) nucleic acids may be present in the submitted sample  additional confirmatory testing may be necessary for epidemiological  and / or clinical management purposes  to differentiate between  SARS-CoV-2 and other Sarbecovirus currently known to infect humans.  If clinically  indicated additional testing with an alternate test  methodology [phone]([id number]) is advised. The SARS-CoV-2 RNA is generally  detectable in upper and lower respiratory sp ecimens during the acute  phase of infection. The expected result is Negative. Fact Sheet for Patients:  BoilerBrush.com.cyhttps://www.fda.gov/media/136312/download Fact Sheet for Healthcare Providers: https://pope.com/https://www.fda.gov/media/136313/download This test is not yet approved or cleared by the [place] FDA and has been authorized for detection and/or diagnosis of SARS-CoV-2 by FDA under an Emergency Use Authorization (EUA).  This EUA will remain in effect (meaning this test can be used) for the duration of the COVID-19 declaration under Section 564(b)(1) of the Act, 21 U.S.C. section 360bbb-3(b)(1), unless the authorization is terminated or revoked sooner. Performed at [facility], [address] [place], [place] [address]      Discharge Instructions:   Discharge Instructions    (HEART FAILURE PATIENTS) Call MD:  Anytime you have any of the following symptoms: 1) 3 pound weight gain in 24 hours or 5 pounds in 1 week 2) shortness of breath, with or without a dry hacking cough 3) swelling in the hands, feet or stomach 4) if you have to sleep on extra pillows at night in order to breathe.   Complete by: As directed    Diet - low sodium heart healthy   Complete by: As directed    Diet Carb Modified   Complete by: As directed    Heart Failure patients record your daily weight using the same scale at the same time of day   Complete by: As directed    Increase activity slowly   Complete by: As directed      Allergies as of 05/06/2019      Reactions   Lisinopril Other (See Comments)   Syncope   Codeine Rash      Medication List    STOP taking these medications   amiodarone 400 MG tablet Commonly known as: PACERONE   metFORMIN 1000 MG tablet Commonly known as: GLUCOPHAGE   oxyCODONE-acetaminophen 5-325 MG  tablet Commonly known as: PERCOCET/ROXICET     TAKE these medications   aspirin 325 MG EC tablet Take 1 tablet (325 mg total) by mouth daily.   atorvastatin 40 MG tablet Commonly known as: LIPITOR Take 1 tablet (40 mg total) by mouth daily at 6 PM.   carvedilol 6.25 MG tablet Commonly known as: COREG Take 1 tablet (6.25 mg total) by mouth 2 (two) times daily with a meal. What changed:   medication strength  how much to take   clopidogrel 75 MG tablet Commonly known as: Plavix Take 1 tablet (75 mg total) by mouth daily.   furosemide 40 MG tablet Commonly known as:  LASIX Take 1 tablet (40 mg total) by mouth daily.   glipiZIDE 5 MG tablet Commonly known as: GLUCOTROL Take 1 tablet (5 mg total) by mouth daily before breakfast.   potassium chloride SA 20 MEQ tablet Commonly known as: K-DUR Take 1 tablet (20 mEq total) by mouth daily.      Follow-up Information    Doristine BosworthStallings, Zoe A, MD Follow up in 1 week(s).   Specialty: Internal Medicine Why: bmp Contact information: [address] [place] [address] [phone]            Time coordinating discharge: 35 min  Signed:  Joseph ArtJessica U Cambren Helm DO  [facility] 05/06/2019, 11:23 AM

## 2019-05-07 ENCOUNTER — Other Ambulatory Visit: Payer: Self-pay | Admitting: Family Medicine

## 2019-05-07 DIAGNOSIS — E1165 Type 2 diabetes mellitus with hyperglycemia: Secondary | ICD-10-CM

## 2019-05-07 LAB — NOVEL CORONAVIRUS, NAA: SARS-CoV-2, NAA: NOT DETECTED

## 2019-05-08 LAB — HEPATITIS PANEL, ACUTE
HCV Ab: <0.1 s/co ratio (ref 0.0–0.9)
Hep A IgM: NEGATIVE
Hep B C IgM: NEGATIVE
Hepatitis B Surface Ag: NEGATIVE

## 2019-09-14 ENCOUNTER — Ambulatory Visit (INDEPENDENT_AMBULATORY_CARE_PROVIDER_SITE_OTHER): Payer: Self-pay | Admitting: Family Medicine

## 2019-09-14 ENCOUNTER — Other Ambulatory Visit: Payer: Self-pay

## 2019-09-14 ENCOUNTER — Encounter: Payer: Self-pay | Admitting: Family Medicine

## 2019-09-14 VITALS — BP 119/71 | HR 93 | Temp 98.1°F | Ht 68.0 in | Wt 219.0 lb

## 2019-09-14 DIAGNOSIS — N289 Disorder of kidney and ureter, unspecified: Secondary | ICD-10-CM

## 2019-09-14 DIAGNOSIS — Z951 Presence of aortocoronary bypass graft: Secondary | ICD-10-CM

## 2019-09-14 DIAGNOSIS — I739 Peripheral vascular disease, unspecified: Secondary | ICD-10-CM

## 2019-09-14 DIAGNOSIS — E1165 Type 2 diabetes mellitus with hyperglycemia: Secondary | ICD-10-CM

## 2019-09-14 MED ORDER — FUROSEMIDE 40 MG PO TABS: 40.0000 mg | ORAL_TABLET | Freq: Every day | ORAL | 5 refills | Status: DC

## 2019-09-14 MED ORDER — METFORMIN HCL 1000 MG PO TABS: 1000.0000 mg | ORAL_TABLET | Freq: Two times a day (BID) | ORAL | 5 refills | Status: DC

## 2019-09-14 MED ORDER — GLIPIZIDE 5 MG PO TABS: 5.0000 mg | ORAL_TABLET | Freq: Every day | ORAL | 5 refills | Status: DC

## 2019-09-14 MED ORDER — CLOPIDOGREL BISULFATE 75 MG PO TABS: 75.0000 mg | ORAL_TABLET | Freq: Every day | ORAL | 5 refills | Status: DC

## 2019-09-14 MED ORDER — POTASSIUM CHLORIDE CRYS ER 20 MEQ PO TBCR: 20.0000 meq | EXTENDED_RELEASE_TABLET | Freq: Every day | ORAL | 5 refills | Status: DC

## 2019-09-14 MED ORDER — ATORVASTATIN CALCIUM 40 MG PO TABS: 40.0000 mg | ORAL_TABLET | Freq: Every day | ORAL | 5 refills | Status: DC

## 2019-09-14 MED ORDER — CARVEDILOL 6.25 MG PO TABS: 6.2500 mg | ORAL_TABLET | Freq: Two times a day (BID) | ORAL | 5 refills | Status: DC

## 2019-09-14 NOTE — Patient Instructions (Signed)
° ° ° °  If you have lab work done today you will be contacted with your lab results within the next 2 weeks.  If you have not heard from us then please contact us. The fastest way to get your results is to register for My Chart. ° ° °IF you received an x-ray today, you will receive an invoice from [facility]. Please contact [facility] at [phone] with questions or concerns regarding your invoice.  ° °IF you received labwork today, you will receive an invoice from [facility]. Please contact [facility] at [phone] with questions or concerns regarding your invoice.  ° °Our billing staff will not be able to assist you with questions regarding bills from these companies. ° °You will be contacted with the lab results as soon as they are available. The fastest way to get your results is to activate your My Chart account. Instructions are located on the last page of this paperwork. If you have not heard from us regarding the results in 2 weeks, please contact this office. °  ° ° ° °

## 2019-09-14 NOTE — Progress Notes (Signed)
11/13/20203:56 PM  Robert Sanchez November 20, 1955, 63 y.o., male [id number]  Chief Complaint  Patient presents with  . Diabetes    self pay only need refill son all meds    HPI:   Patient is a 63 y.o. male with past medical history significant for DM2, PAD, mild CKI, CHF, CAD s/p CABGx3 who presents today for medication refill  Last OV Oct 2019 with Dr Creta LevinStallings Seen on hosp July 2020 CHF exacerbation, off meds Echo in aug 2019 LVEF 25-30%, mod MR Checks cbgs daily 120-220 Quit drinking etoh Uninsured, very financially limited  Lab Results  Component Value Date   HGBA1C 7.8 (A) 08/11/2018   HGBA1C 12.4 (H) 06/12/2018   HGBA1C 12.3 (H) 06/11/2018   Lab Results  Component Value Date   LDLCALC 100 (H) 06/10/2018   CREATININE 1.33 (H) 05/06/2019    Depression screen PHQ 2/9 09/14/2019 08/29/2018 08/11/2018  Decreased Interest 0 0 0  Down, Depressed, Hopeless 0 0 0  PHQ - 2 Score 0 0 0    Fall Risk  09/14/2019 08/29/2018 08/11/2018 06/27/2018 08/29/2017  Falls in the past year? 0 No No No No  Number falls in past yr: 0 - - - -  Injury with Fall? 0 - - - -     Allergies  Allergen Reactions  . Lisinopril Other (See Comments)    Syncope  . Codeine Rash    Prior to Admission medications   Medication Sig Start Date End Date Taking? Authorizing Provider  aspirin EC 325 MG EC tablet Take 1 tablet (325 mg total) by mouth daily. 06/20/18  Yes Barrett, Erin R, PA-C  atorvastatin (LIPITOR) 40 MG tablet Take 1 tablet (40 mg total) by mouth daily at 6 PM. 05/06/19  Yes Vann, Jessica U, DO  carvedilol (COREG) 6.25 MG tablet Take 1 tablet (6.25 mg total) by mouth 2 (two) times daily with a meal. 05/06/19  Yes Vann, Jessica U, DO  clopidogrel (PLAVIX) 75 MG tablet Take 1 tablet (75 mg total) by mouth daily. 05/06/19 05/05/20 Yes Vann, Jessica U, DO  furosemide (LASIX) 40 MG tablet Take 1 tablet (40 mg total) by mouth daily. 05/06/19  Yes Vann, Jessica U, DO  glipiZIDE (GLUCOTROL) 5 MG  tablet Take 1 tablet (5 mg total) by mouth daily before breakfast. 05/06/19  Yes Vann, Jessica U, DO  potassium chloride SA (K-DUR) 20 MEQ tablet Take 1 tablet (20 mEq total) by mouth daily. 05/06/19  Yes Joseph ArtVann, Jessica U, DO    Past Medical History:  Diagnosis Date  . Acute pulmonary edema (HCC) 06/05/2018  . Arthritis   . COPD (chronic obstructive pulmonary disease) (HCC)    " beginning stages " per pt  . Coronary artery disease   . Diabetes mellitus without complication (HCC)   . Diabetic neuropathy (HCC)   . Diabetic neuropathy (HCC)   . Emphysema/COPD (HCC)    " beginning stages"  . GERD (gastroesophageal reflux disease)   . HOH (hard of hearing)   . Myocardial infarction (HCC)   . Peripheral vascular disease [facility](HCC)     Past Surgical History:  Procedure Laterality Date  . ABDOMINAL AORTOGRAM W/LOWER EXTREMITY N/A 06/21/2018   Procedure: ABDOMINAL AORTOGRAM W/LOWER EXTREMITY;  Surgeon: Cephus Shellinglark, Christopher J, MD;  Location: [facility] INVASIVE CV LAB;  Service: Cardiovascular;  Laterality: N/A;  . CORONARY ARTERY BYPASS GRAFT N/A 06/12/2018   Procedure: CORONARY ARTERY BYPASS GRAFTING (CABG) x 3 WITH ENDOSCOPIC HARVESTING OF RIGHT GREATER SAPHENOUS VEIN. LIMA TO LAD.  SVG TO PD. SVG TO DIAGONAL.;  Surgeon: Kerin Perna, MD;  Location[facility];  Service: Open Heart Surgery;  Laterality: N/A;  . FEMORAL-TIBIAL BYPASS GRAFT Left 08/21/2018   Procedure: BYPASS GRAFT LEFT FEMORAL TO POSTERIOR TIBIAL ARTERY USING LEFT REVERSED GREAT SAPHENOUS VEIN;  Surgeon: Cephus Shelling, MD;  Location: [facility] OR;  Service: Vascular;  Laterality: Left;  . KNEE ARTHROSCOPY    . PERIPHERAL VASCULAR INTERVENTION Left 06/21/2018   Procedure: PERIPHERAL VASCULAR INTERVENTION;  Surgeon: Cephus Shelling, MD;  Location: [facility] INVASIVE CV LAB;  Service: Cardiovascular;  Laterality: Left;  . RIGHT/LEFT HEART CATH AND CORONARY ANGIOGRAPHY N/A 06/05/2018   Procedure: RIGHT/LEFT HEART CATH AND CORONARY ANGIOGRAPHY;  Surgeon:  Orpah Cobb, MD;  Location: [facility] INVASIVE CV LAB;  Service: Cardiovascular;  Laterality: N/A;  . SPINE SURGERY    . TEE WITHOUT CARDIOVERSION N/A 06/12/2018   Procedure: TRANSESOPHAGEAL ECHOCARDIOGRAM (TEE);  Surgeon: Donata Clay, Theron Arista, MD;  Location[facility] OR;  Service: Open Heart Surgery;  Laterality: N/A;  . teeth extractions    . VEIN HARVEST Left 08/21/2018   Procedure: VEIN HARVEST LEFT GREAT SAPHENOUS VEIN;  Surgeon: Cephus Shelling, MD;  Location: [facility] OR;  Service: Vascular;  Laterality: Left;    Social History   Tobacco Use  . Smoking status: Former Smoker    Packs/day: 1.00    Years: 40.00    Pack years: 40.00    Quit date: 06/02/2018    Years since quitting: 1.2  . Smokeless tobacco: Never Used  Substance Use Topics  . Alcohol use: Not Currently    Family History  Problem Relation Age of Onset  . Diabetes Mother   . Lung disease Mother   . Cancer Father   . Heart disease Father     Review of Systems  Constitutional: Negative for chills and fever.  Respiratory: Negative for cough and shortness of breath.   Cardiovascular: Negative for chest pain, palpitations, orthopnea, claudication, leg swelling and PND.  Gastrointestinal: Negative for abdominal pain, blood in stool, melena, nausea and vomiting.  Genitourinary: Negative for frequency, hematuria and urgency.  Endo/Heme/Allergies: Negative for polydipsia. Does not bruise/bleed easily.     OBJECTIVE:  Today's Vitals   09/14/19 1525  BP: 119/71  Pulse: 93  Temp: 98.1 F (36.7 C)  SpO2: 97%  Weight: 219 lb (99.3 kg)  Height: 5\' 8"  (1.727 m)   Body mass index is 33.3 kg/m.  Wt Readings from Last 3 Encounters:  09/14/19 219 lb (99.3 kg)  05/06/19 204 lb 12.8 oz (92.9 kg)  09/19/18 200 lb (90.7 kg)    Physical Exam Vitals signs and nursing note reviewed.  Constitutional:      Appearance: He is well-developed.  HENT:     Head: Normocephalic and atraumatic.  Eyes:     Conjunctiva/sclera:  Conjunctivae normal.     Pupils: Pupils are equal, round, and reactive to light.  Neck:     Musculoskeletal: Neck supple.  Cardiovascular:     Rate and Rhythm: Normal rate and regular rhythm.     Heart sounds: No murmur. No friction rub. No gallop.   Pulmonary:     Effort: Pulmonary effort is normal.     Breath sounds: Normal breath sounds. No wheezing or rales.  Musculoskeletal:     Right lower leg: No edema.     Left lower leg: No edema.  Skin:    General: Skin is warm and dry.  Neurological:     Mental Status:  He is alert and oriented to person, place, and time.     No results found for this or any previous visit (from the past 24 hour(s)).  No results found.   ASSESSMENT and PLAN  1. Uncontrolled type 2 diabetes mellitus with hyperglycemia ([place]) Labs pending. Discussed with patient importance of regular medical appt and HCM.  - metFORMIN (GLUCOPHAGE) 1000 MG tablet; Take 1 tablet (1,000 mg total) by mouth 2 (two) times daily with a meal. - glipiZIDE (GLUCOTROL) 5 MG tablet; Take 1 tablet (5 mg total) by mouth daily before breakfast. - Hemoglobin A1c - Comprehensive metabolic panel - Lipid panel  2. S/P CABG x 3 Encouraged reestablishing care with cards  3. PAD (peripheral artery disease) ([place])  4. Mild renal insufficiency  Checking labs today, medications will be adjusted as needed.   Other orders - atorvastatin (LIPITOR) 40 MG tablet; Take 1 tablet (40 mg total) by mouth daily at 6 PM. - carvedilol (COREG) 6.25 MG tablet; Take 1 tablet (6.25 mg total) by mouth 2 (two) times daily with a meal. - clopidogrel (PLAVIX) 75 MG tablet; Take 1 tablet (75 mg total) by mouth daily. - furosemide (LASIX) 40 MG tablet; Take 1 tablet (40 mg total) by mouth daily. - potassium chloride SA (KLOR-CON) 20 MEQ tablet; Take 1 tablet (20 mEq total) by mouth daily.  Return in about 6 months (around 03/13/2020).    Rutherford Guys, MD [facility]  [place], [place] [address] Ph.  [phone] Fax [phone]

## 2019-09-15 LAB — LIPID PANEL
Chol/HDL Ratio: 5.9 ratio — ABNORMAL HIGH (ref 0.0–5.0)
Cholesterol, Total: 165 mg/dL (ref 100–199)
HDL: 28 mg/dL — ABNORMAL LOW (ref >39)
LDL Chol Calc (NIH): 114 mg/dL — ABNORMAL HIGH (ref 0–99)
Triglycerides: 127 mg/dL (ref 0–149)
VLDL Cholesterol Cal: 23 mg/dL (ref 5–40)

## 2019-09-15 LAB — COMPREHENSIVE METABOLIC PANEL
ALT: 13 IU/L (ref 0–44)
AST: 13 IU/L (ref 0–40)
Albumin/Globulin Ratio: 1.3 (ref 1.2–2.2)
Albumin: 4.4 g/dL (ref 3.8–4.8)
Alkaline Phosphatase: 87 IU/L (ref 39–117)
BUN/Creatinine Ratio: 15 (ref 10–24)
BUN: 20 mg/dL (ref 8–27)
Bilirubin Total: 0.5 mg/dL (ref 0.0–1.2)
CO2: 19 mmol/L — ABNORMAL LOW (ref 20–29)
Calcium: 9.3 mg/dL (ref 8.6–10.2)
Chloride: 100 mmol/L (ref 96–106)
Creatinine, Ser: 1.3 mg/dL — ABNORMAL HIGH (ref 0.76–1.27)
GFR calc Af Amer: 68 mL/min/{1.73_m2} (ref >59)
GFR calc non Af Amer: 59 mL/min/{1.73_m2} — ABNORMAL LOW (ref >59)
Globulin, Total: 3.4 g/dL (ref 1.5–4.5)
Glucose: 233 mg/dL — ABNORMAL HIGH (ref 65–99)
Potassium: 4.8 mmol/L (ref 3.5–5.2)
Sodium: 136 mmol/L (ref 134–144)
Total Protein: 7.8 g/dL (ref 6.0–8.5)

## 2019-09-15 LAB — HEMOGLOBIN A1C
Est. average glucose Bld gHb Est-mCnc: 189 mg/dL
Hgb A1c MFr Bld: 8.2 % — ABNORMAL HIGH (ref 4.8–5.6)

## 2019-09-20 ENCOUNTER — Other Ambulatory Visit: Payer: Self-pay | Admitting: Family Medicine

## 2019-09-20 DIAGNOSIS — E1165 Type 2 diabetes mellitus with hyperglycemia: Secondary | ICD-10-CM

## 2019-09-20 MED ORDER — GLIPIZIDE 5 MG PO TABS: 5.0000 mg | ORAL_TABLET | Freq: Two times a day (BID) | ORAL | 5 refills | Status: DC

## 2019-09-20 MED ORDER — ATORVASTATIN CALCIUM 80 MG PO TABS: 80.0000 mg | ORAL_TABLET | Freq: Every day | ORAL | 11 refills | Status: DC

## 2019-11-07 IMAGING — DX DG CHEST 1V PORT
1 series · 1 of 1 positions shown · non-contrast
Comparison: None.

CLINICAL DATA: Respiratory distress.

EXAM:
PORTABLE CHEST 1 VIEW

[chest]
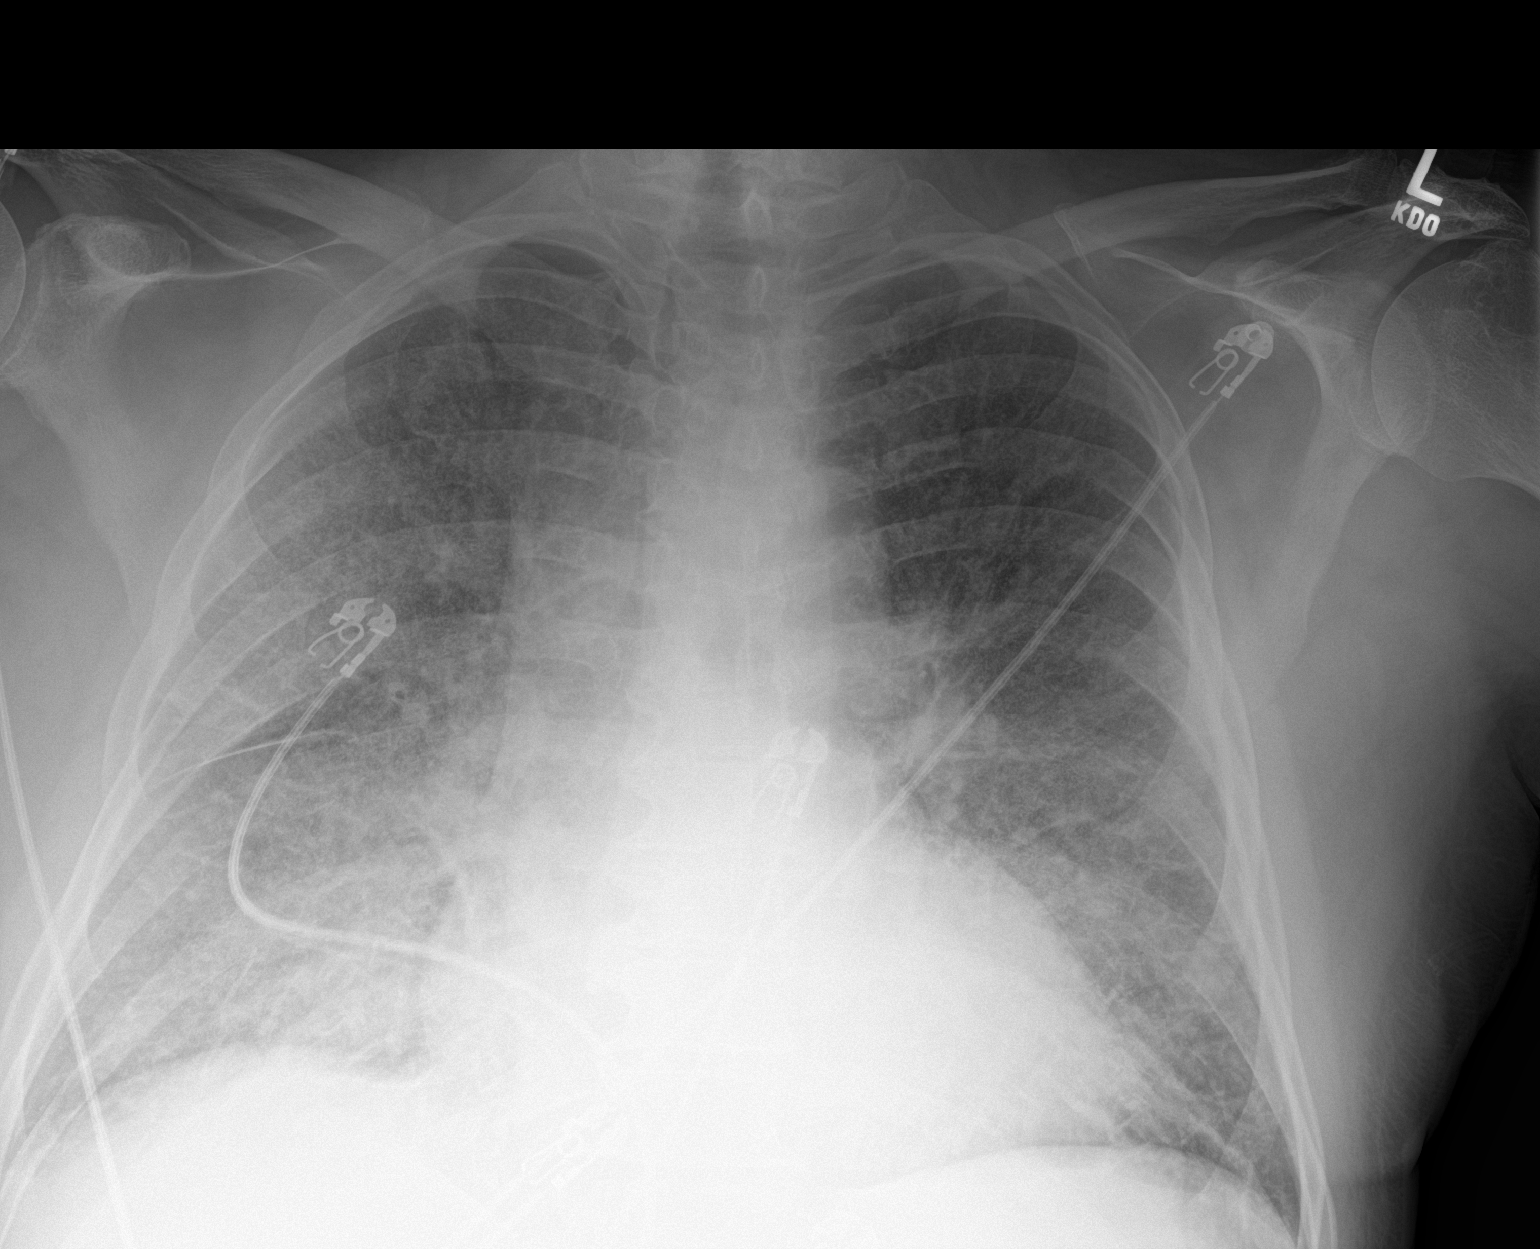

[1 of 1 positions shown; findings below may reference images not displayed]

FINDINGS: Mild cardiomegaly. Normal mediastinal contours. Significant diffuse
bilateral interstitial and bronchial thickening. Probable fluid in
the right minor fissure. No pneumothorax. No acute osseous
abnormalities.
IMPRESSION: Mild cardiomegaly with diffuse interstitial and bronchial thickening
suspicious for pulmonary edema, at least moderate in degree.
Probable fluid in the right minor fissure.

## 2019-11-08 IMAGING — DX DG CHEST 1V PORT SAME DAY
1 series · 1 of 1 positions shown · non-contrast
Comparison: 06/03/2018

CLINICAL DATA: CHF

EXAM:
PORTABLE CHEST 1 VIEW

[chest ap]
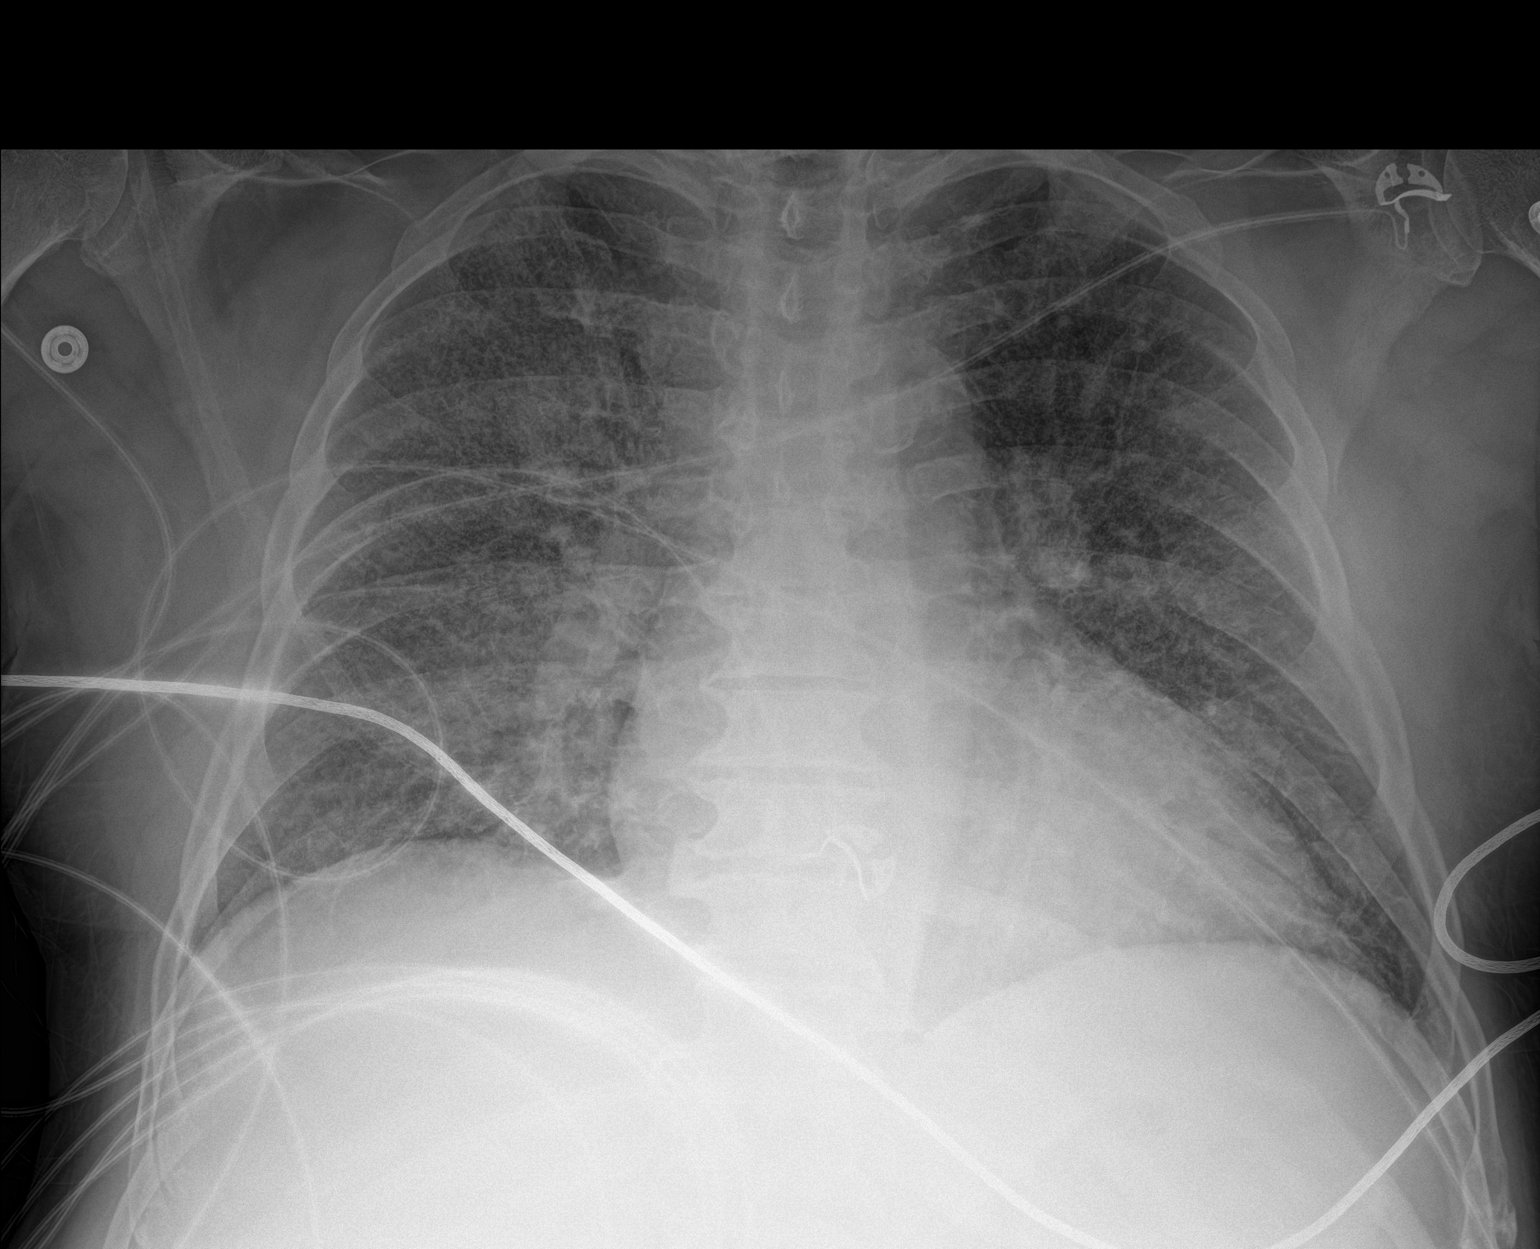

[1 of 1 positions shown; findings below may reference images not displayed]

FINDINGS: Cardiac shadow is stable. Diffuse changes of CHF are again
identified and relatively stable given some variation in the
technical factors of the image. No new focal abnormality is seen.
IMPRESSION: Stable CHF.

## 2019-11-13 IMAGING — CT CT CHEST W/O CM
2 of 3 series · 15 of 36 positions shown, 18 images · non-contrast
Comparison: No prior CT. Chest x-rays 06/08/2018 dating back to
06/03/2018.

CLINICAL DATA: Chest pain, shortness of breath. Patient admitted
for CHF. Acute coronary syndrome.

EXAM:
CT CHEST WITHOUT CONTRAST
TECHNIQUE: Multidetector CT imaging of the chest was performed following the
standard protocol without IV contrast.

[Series 4: thorax 2.0 · axial · 0.79mm/px · z∈[+1044,+1334]mm · 12 of 171 slices shown, 15 images]
[im 13/171  mediastinal]
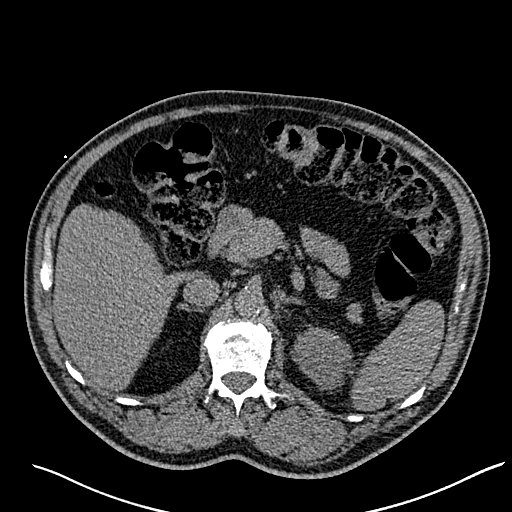
[im 13/171  lung]
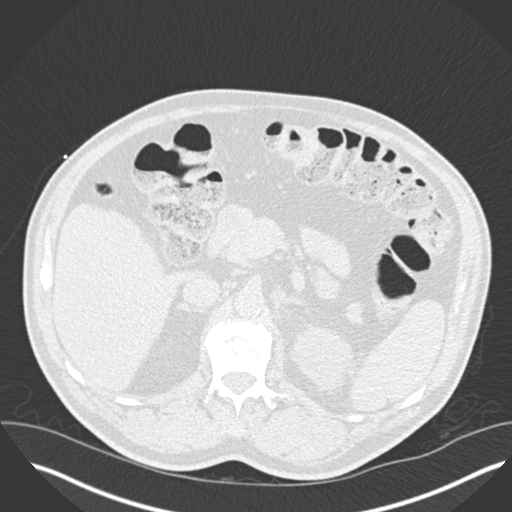
[im 26/171  lung]
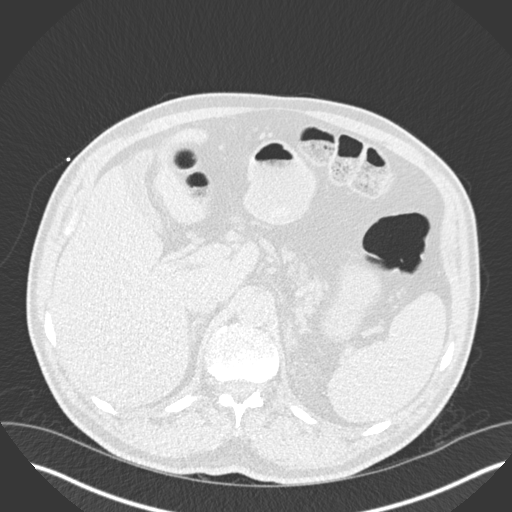
[im 38/171  lung]
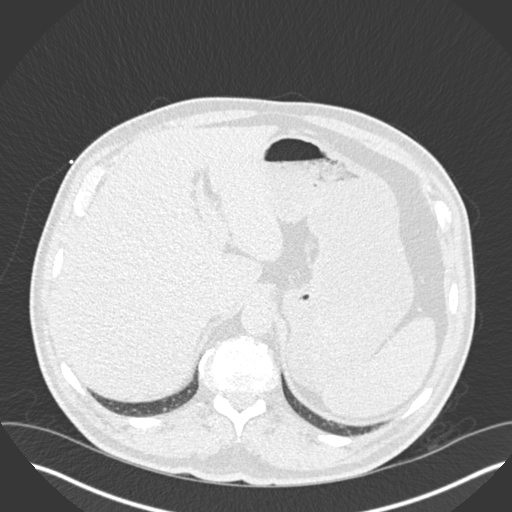
[im 51/171  lung]
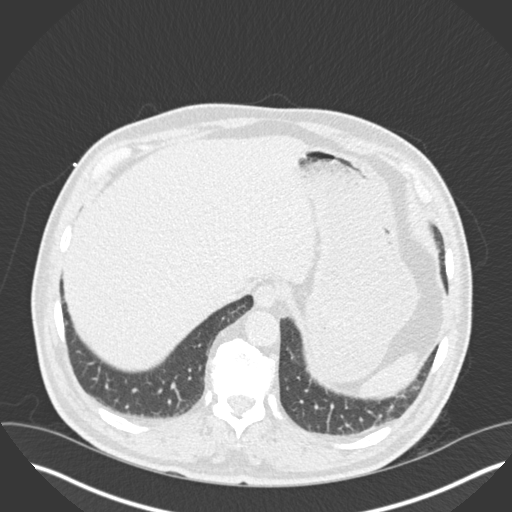
[im 63/171  mediastinal]
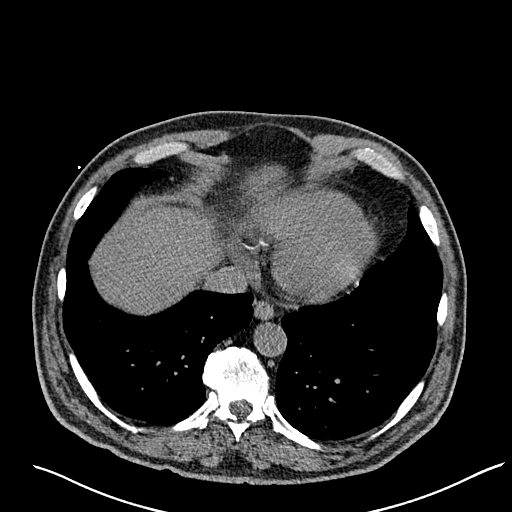
[im 63/171  lung]
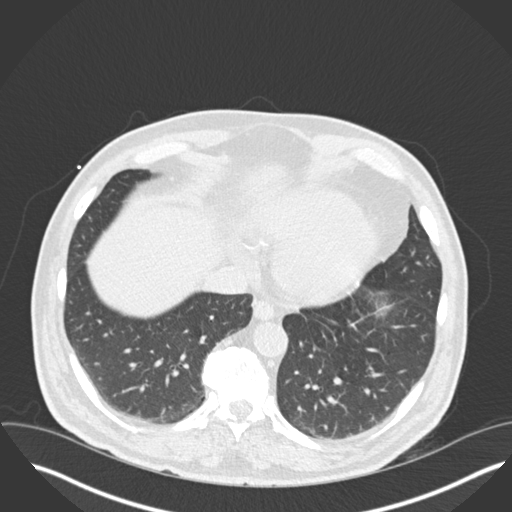
[im 76/171  lung]
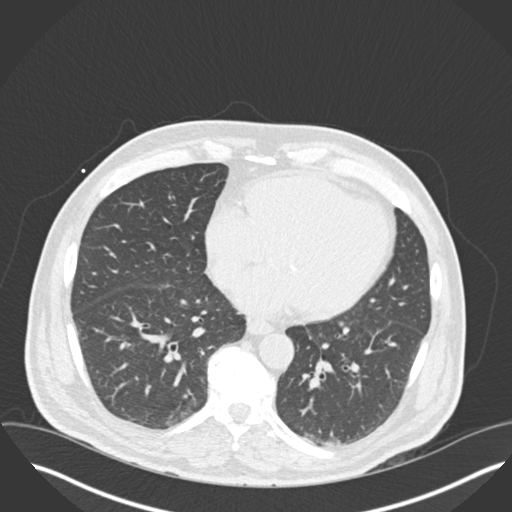
[im 95/171  lung]
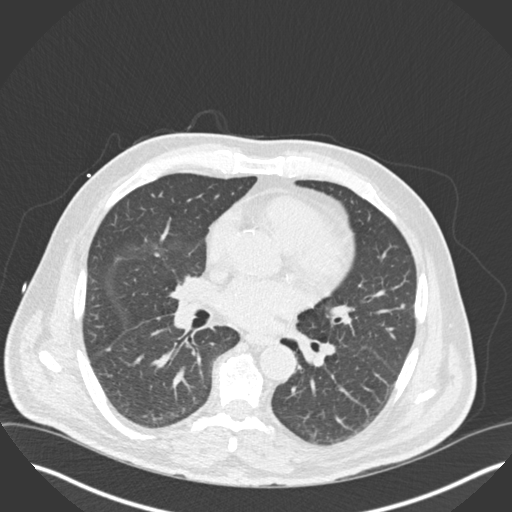
[im 108/171  lung]
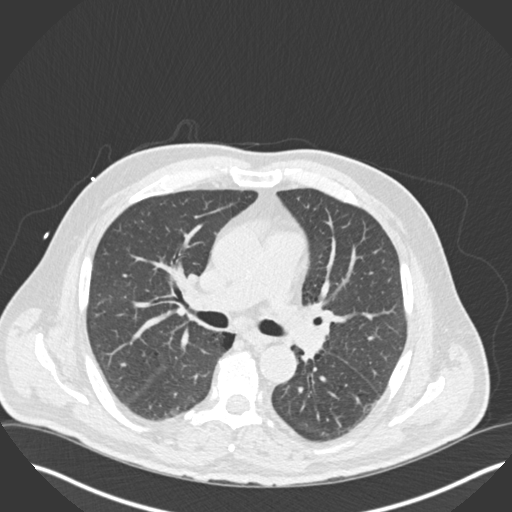
[im 120/171  mediastinal]
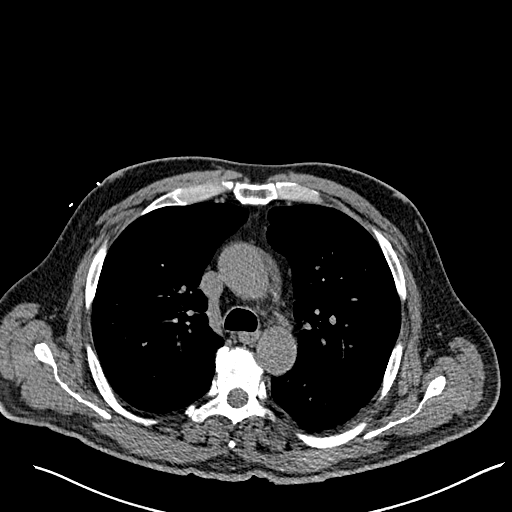
[im 120/171  lung]
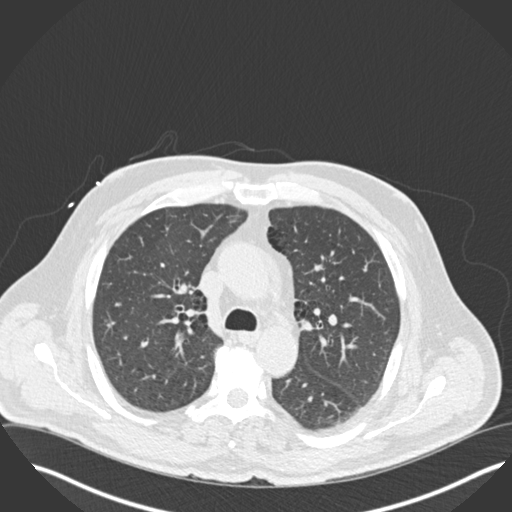
[im 133/171  lung]
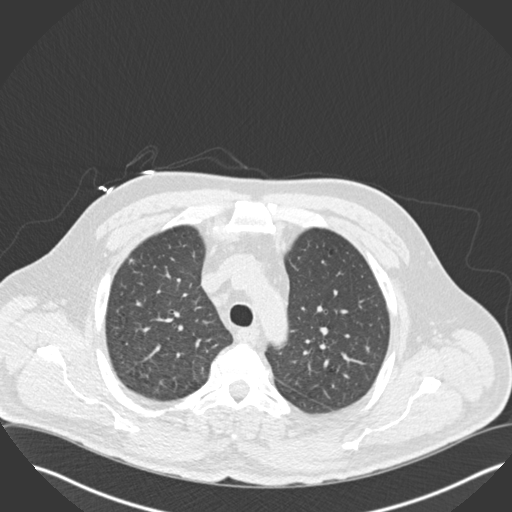
[im 145/171  lung]
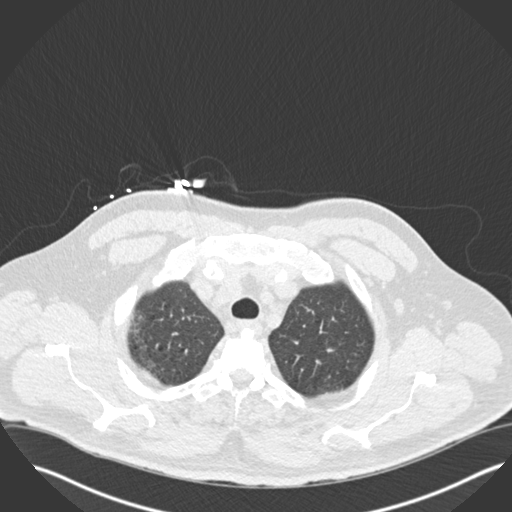
[im 158/171  lung]
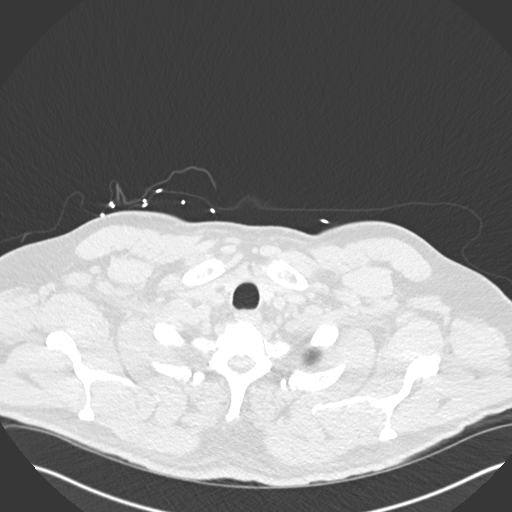

[Series 6: coronal · coronal · 0.67mm/px · 3 of 101 slices shown]
[im 21/101  lung]
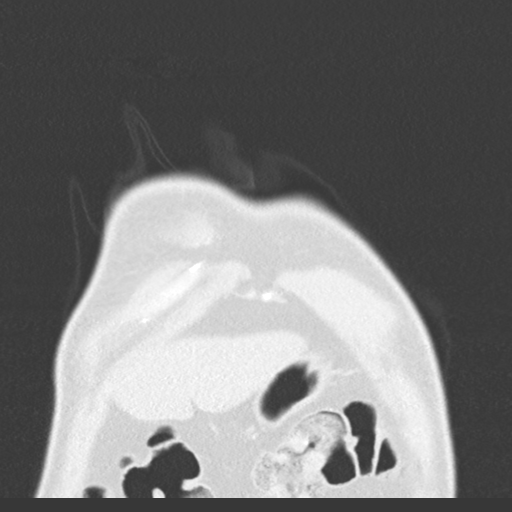
[im 41/101  lung]
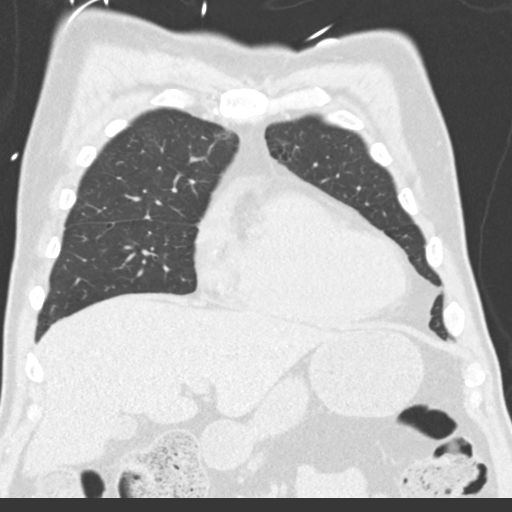
[im 61/101  lung]
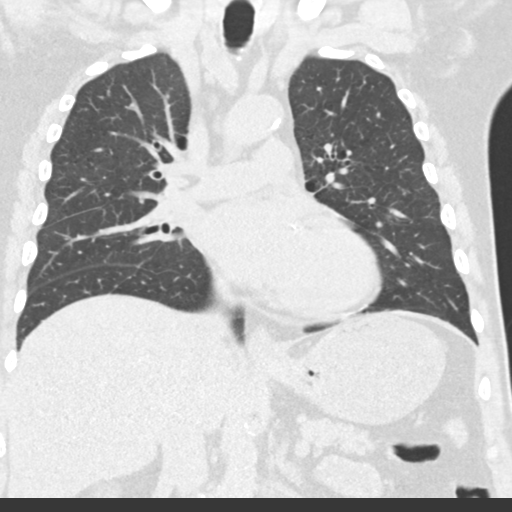

[15 of 36 positions shown; findings below may reference images not displayed]

FINDINGS: Cardiovascular: Heart mildly enlarged. Mild mitral annular
calcifications. Moderate aortic annular calcification. Severe
three-vessel coronary atherosclerosis. Very small pericardial
effusion. Mild to moderate atherosclerosis involving the thoracic
and proximal abdominal aorta without evidence of aneurysm.

Mediastinum/Nodes: Numerous normal sized lymph nodes throughout the
mediastinum. No pathologically enlarged mediastinal, hilar or
axillary lymph nodes. No mediastinal masses. Normal-appearing
esophagus. Normal-appearing thyroid gland.

Lungs/Pleura: Emphysematous changes throughout both lungs.
Resolution of previously identified pulmonary edema. In the LATERAL
RIGHT LOWER LOBE adjacent to the major fissure there is a 6 x 4 mm
nodule (5 mm mean diameter) on image 78 of series 8. Scattered
smaller nodules are present in the RIGHT MIDDLE LOBE (image 78),
RIGHT LOWER LOBE (image 87) and LEFT UPPER LOBE (image 78). Central
airways patent with mild bronchial wall thickening. No pleural
effusions.

Upper Abdomen: Large stool burden in the visualized colon. Benign
simple cyst arising from the mid LEFT kidney. Visualized upper
abdomen otherwise unremarkable for the unenhanced technique.

Musculoskeletal: Degenerative disc disease and spondylosis
throughout the thoracic spine. No acute findings.
IMPRESSION: 1. Resolution of previously identified pulmonary edema. No acute
cardiopulmonary disease currently.
2. Multiple small BILATERAL lung nodules, the largest measuring
approximately 5 mm in the LATERAL RIGHT LOWER LOBE adjacent to the
major fissure. No follow-up needed if patient is low-risk (and has
no known or suspected primary neoplasm). Non-contrast chest CT can
be considered in 12 months if patient is high-risk. This
recommendation follows the consensus statement: Guidelines for
Management of Incidental Pulmonary Nodules Detected on CT Images:
3. Mild cardiomegaly.  Severe three-vessel coronary atherosclerosis.

Aortic Atherosclerosis (E7BD6-T14.4) and Emphysema (E7BD6-6ZF.W).

## 2020-01-15 ENCOUNTER — Inpatient Hospital Stay (HOSPITAL_COMMUNITY): Payer: Self-pay

## 2020-01-15 ENCOUNTER — Other Ambulatory Visit: Payer: Self-pay

## 2020-01-15 ENCOUNTER — Encounter (HOSPITAL_COMMUNITY): Payer: Self-pay | Admitting: Emergency Medicine

## 2020-01-15 ENCOUNTER — Emergency Department (HOSPITAL_COMMUNITY): Payer: Self-pay

## 2020-01-15 ENCOUNTER — Inpatient Hospital Stay (HOSPITAL_COMMUNITY)
Admission: EM | Admit: 2020-01-15 | Discharge: 2020-01-21 | DRG: 270 | Disposition: A | Discharge status: Home / Self Care | Payer: Self-pay | Attending: Family Medicine | Admitting: Family Medicine

## 2020-01-15 DIAGNOSIS — M7989 Other specified soft tissue disorders: Secondary | ICD-10-CM

## 2020-01-15 DIAGNOSIS — Z7984 Long term (current) use of oral hypoglycemic drugs: Secondary | ICD-10-CM

## 2020-01-15 DIAGNOSIS — Z79899 Other long term (current) drug therapy: Secondary | ICD-10-CM

## 2020-01-15 DIAGNOSIS — N1831 Chronic kidney disease, stage 3a: Secondary | ICD-10-CM | POA: Diagnosis present

## 2020-01-15 DIAGNOSIS — I2510 Atherosclerotic heart disease of native coronary artery without angina pectoris: Secondary | ICD-10-CM | POA: Diagnosis present

## 2020-01-15 DIAGNOSIS — Z7902 Long term (current) use of antithrombotics/antiplatelets: Secondary | ICD-10-CM

## 2020-01-15 DIAGNOSIS — T82868A Thrombosis of vascular prosthetic devices, implants and grafts, initial encounter: Secondary | ICD-10-CM

## 2020-01-15 DIAGNOSIS — L03119 Cellulitis of unspecified part of limb: Secondary | ICD-10-CM | POA: Diagnosis present

## 2020-01-15 DIAGNOSIS — Z20822 Contact with and (suspected) exposure to covid-19: Secondary | ICD-10-CM | POA: Diagnosis present

## 2020-01-15 DIAGNOSIS — I998 Other disorder of circulatory system: Secondary | ICD-10-CM

## 2020-01-15 DIAGNOSIS — E1151 Type 2 diabetes mellitus with diabetic peripheral angiopathy without gangrene: Principal | ICD-10-CM | POA: Diagnosis present

## 2020-01-15 DIAGNOSIS — R001 Bradycardia, unspecified: Secondary | ICD-10-CM | POA: Diagnosis present

## 2020-01-15 DIAGNOSIS — H9190 Unspecified hearing loss, unspecified ear: Secondary | ICD-10-CM | POA: Diagnosis present

## 2020-01-15 DIAGNOSIS — Z885 Allergy status to narcotic agent status: Secondary | ICD-10-CM

## 2020-01-15 DIAGNOSIS — Z9889 Other specified postprocedural states: Secondary | ICD-10-CM | POA: Diagnosis present

## 2020-01-15 DIAGNOSIS — J439 Emphysema, unspecified: Secondary | ICD-10-CM | POA: Diagnosis present

## 2020-01-15 DIAGNOSIS — Z8249 Family history of ischemic heart disease and other diseases of the circulatory system: Secondary | ICD-10-CM

## 2020-01-15 DIAGNOSIS — Z87891 Personal history of nicotine dependence: Secondary | ICD-10-CM

## 2020-01-15 DIAGNOSIS — Z959 Presence of cardiac and vascular implant and graft, unspecified: Secondary | ICD-10-CM

## 2020-01-15 DIAGNOSIS — I5023 Acute on chronic systolic (congestive) heart failure: Secondary | ICD-10-CM | POA: Diagnosis present

## 2020-01-15 DIAGNOSIS — Z95820 Peripheral vascular angioplasty status with implants and grafts: Secondary | ICD-10-CM

## 2020-01-15 DIAGNOSIS — M79609 Pain in unspecified limb: Secondary | ICD-10-CM

## 2020-01-15 DIAGNOSIS — E1122 Type 2 diabetes mellitus with diabetic chronic kidney disease: Secondary | ICD-10-CM | POA: Diagnosis present

## 2020-01-15 DIAGNOSIS — N183 Chronic kidney disease, stage 3 unspecified: Secondary | ICD-10-CM | POA: Diagnosis present

## 2020-01-15 DIAGNOSIS — I739 Peripheral vascular disease, unspecified: Secondary | ICD-10-CM

## 2020-01-15 DIAGNOSIS — Z836 Family history of other diseases of the respiratory system: Secondary | ICD-10-CM

## 2020-01-15 DIAGNOSIS — E872 Acidosis: Secondary | ICD-10-CM | POA: Diagnosis present

## 2020-01-15 DIAGNOSIS — Z7982 Long term (current) use of aspirin: Secondary | ICD-10-CM

## 2020-01-15 DIAGNOSIS — Z833 Family history of diabetes mellitus: Secondary | ICD-10-CM

## 2020-01-15 DIAGNOSIS — E1140 Type 2 diabetes mellitus with diabetic neuropathy, unspecified: Secondary | ICD-10-CM | POA: Diagnosis present

## 2020-01-15 DIAGNOSIS — D72829 Elevated white blood cell count, unspecified: Secondary | ICD-10-CM | POA: Diagnosis present

## 2020-01-15 DIAGNOSIS — E785 Hyperlipidemia, unspecified: Secondary | ICD-10-CM | POA: Diagnosis present

## 2020-01-15 DIAGNOSIS — J811 Chronic pulmonary edema: Secondary | ICD-10-CM | POA: Diagnosis present

## 2020-01-15 DIAGNOSIS — Z951 Presence of aortocoronary bypass graft: Secondary | ICD-10-CM

## 2020-01-15 DIAGNOSIS — Z888 Allergy status to other drugs, medicaments and biological substances status: Secondary | ICD-10-CM

## 2020-01-15 DIAGNOSIS — I130 Hypertensive heart and chronic kidney disease with heart failure and stage 1 through stage 4 chronic kidney disease, or unspecified chronic kidney disease: Secondary | ICD-10-CM | POA: Diagnosis present

## 2020-01-15 DIAGNOSIS — L03116 Cellulitis of left lower limb: Secondary | ICD-10-CM | POA: Diagnosis present

## 2020-01-15 DIAGNOSIS — J810 Acute pulmonary edema: Secondary | ICD-10-CM

## 2020-01-15 DIAGNOSIS — E1165 Type 2 diabetes mellitus with hyperglycemia: Secondary | ICD-10-CM | POA: Diagnosis present

## 2020-01-15 DIAGNOSIS — I252 Old myocardial infarction: Secondary | ICD-10-CM

## 2020-01-15 LAB — BRAIN NATRIURETIC PEPTIDE: B Natriuretic Peptide: 244.9 pg/mL — ABNORMAL HIGH (ref 0.0–100.0)

## 2020-01-15 LAB — COMPREHENSIVE METABOLIC PANEL
ALT: 16 U/L (ref 0–44)
AST: 13 U/L — ABNORMAL LOW (ref 15–41)
Albumin: 3.7 g/dL (ref 3.5–5.0)
Alkaline Phosphatase: 68 U/L (ref 38–126)
Anion gap: 12 (ref 5–15)
BUN: 23 mg/dL (ref 8–23)
CO2: 22 mmol/L (ref 22–32)
Calcium: 9.3 mg/dL (ref 8.9–10.3)
Chloride: 105 mmol/L (ref 98–111)
Creatinine, Ser: 1.32 mg/dL — ABNORMAL HIGH (ref 0.61–1.24)
GFR calc Af Amer: >60 mL/min (ref >60)
GFR calc non Af Amer: 58 mL/min — ABNORMAL LOW (ref >60)
Glucose, Bld: 234 mg/dL — ABNORMAL HIGH (ref 70–99)
Potassium: 4.5 mmol/L (ref 3.5–5.1)
Sodium: 139 mmol/L (ref 135–145)
Total Bilirubin: 0.8 mg/dL (ref 0.3–1.2)
Total Protein: 7.9 g/dL (ref 6.5–8.1)

## 2020-01-15 LAB — CBC WITH DIFFERENTIAL/PLATELET
Abs Immature Granulocytes: 0.07 10*3/uL (ref 0.00–0.07)
Basophils Absolute: 0.1 10*3/uL (ref 0.0–0.1)
Basophils Relative: 1 %
Eosinophils Absolute: 0.2 10*3/uL (ref 0.0–0.5)
Eosinophils Relative: 2 %
HCT: 41.7 % (ref 39.0–52.0)
Hemoglobin: 13.4 g/dL (ref 13.0–17.0)
Immature Granulocytes: 1 %
Lymphocytes Relative: 12 %
Lymphs Abs: 1.5 10*3/uL (ref 0.7–4.0)
MCH: 29.6 pg (ref 26.0–34.0)
MCHC: 32.1 g/dL (ref 30.0–36.0)
MCV: 92.3 fL (ref 80.0–100.0)
Monocytes Absolute: 1.2 10*3/uL — ABNORMAL HIGH (ref 0.1–1.0)
Monocytes Relative: 10 %
Neutro Abs: 9.4 10*3/uL — ABNORMAL HIGH (ref 1.7–7.7)
Neutrophils Relative %: 74 %
Platelets: 314 10*3/uL (ref 150–400)
RBC: 4.52 MIL/uL (ref 4.22–5.81)
RDW: 13.7 % (ref 11.5–15.5)
WBC: 12.5 10*3/uL — ABNORMAL HIGH (ref 4.0–10.5)
nRBC: 0 % (ref 0.0–0.2)

## 2020-01-15 LAB — HEMOGLOBIN A1C
Hgb A1c MFr Bld: 10 % — ABNORMAL HIGH (ref 4.8–5.6)
Mean Plasma Glucose: 240.3 mg/dL

## 2020-01-15 LAB — GLUCOSE, CAPILLARY: Glucose-Capillary: 190 mg/dL — ABNORMAL HIGH (ref 70–99)

## 2020-01-15 LAB — LACTIC ACID, PLASMA
Lactic Acid, Venous: 2.1 mmol/L (ref 0.5–1.9)
Lactic Acid, Venous: 1.9 mmol/L (ref 0.5–1.9)

## 2020-01-15 LAB — CBG MONITORING, ED: Glucose-Capillary: 208 mg/dL — ABNORMAL HIGH (ref 70–99)

## 2020-01-15 LAB — SARS CORONAVIRUS 2 (TAT 6-24 HRS): SARS Coronavirus 2: NEGATIVE

## 2020-01-15 LAB — HIV ANTIBODY (ROUTINE TESTING W REFLEX): HIV Screen 4th Generation wRfx: NONREACTIVE

## 2020-01-15 MED ORDER — VANCOMYCIN HCL 1250 MG/250ML IV SOLN
1250.0000 mg | INTRAVENOUS | Status: DC
  Administered 2020-01-16: 16:00:00 1250 mg via INTRAVENOUS
  Filled 2020-01-15 (×2): qty 250

## 2020-01-15 MED ORDER — FUROSEMIDE 10 MG/ML IJ SOLN
40.0000 mg | Freq: Once | INTRAMUSCULAR | Status: AC
  Administered 2020-01-15: 22:00:00 40 mg via INTRAVENOUS
  Filled 2020-01-15: qty 4

## 2020-01-15 MED ORDER — ACETAMINOPHEN 325 MG PO TABS
650.0000 mg | ORAL_TABLET | Freq: Four times a day (QID) | ORAL | Status: DC | PRN
  Administered 2020-01-17: 650 mg via ORAL

## 2020-01-15 MED ORDER — FENTANYL CITRATE (PF) 100 MCG/2ML IJ SOLN
25.0000 ug | INTRAMUSCULAR | Status: DC | PRN
  Administered 2020-01-16 – 2020-01-18 (×5): 25 ug via INTRAVENOUS
  Filled 2020-01-15 (×5): qty 2

## 2020-01-15 MED ORDER — ONDANSETRON HCL 4 MG/2ML IJ SOLN: 4.0000 mg | Freq: Four times a day (QID) | INTRAMUSCULAR | Status: DC | PRN

## 2020-01-15 MED ORDER — ACETAMINOPHEN 650 MG RE SUPP: 650.0000 mg | Freq: Four times a day (QID) | RECTAL | Status: DC | PRN

## 2020-01-15 MED ORDER — ASPIRIN EC 325 MG PO TBEC
325.0000 mg | DELAYED_RELEASE_TABLET | ORAL | Status: DC
  Administered 2020-01-19 – 2020-01-21 (×2): 325 mg via ORAL
  Filled 2020-01-15 (×3): qty 1

## 2020-01-15 MED ORDER — ENOXAPARIN SODIUM 40 MG/0.4ML ~~LOC~~ SOLN
40.0000 mg | SUBCUTANEOUS | Status: DC
  Administered 2020-01-15: 40 mg via SUBCUTANEOUS
  Filled 2020-01-15: qty 0.4

## 2020-01-15 MED ORDER — SODIUM CHLORIDE 0.9 % IV BOLUS
500.0000 mL | Freq: Once | INTRAVENOUS | Status: AC
  Administered 2020-01-15: 09:00:00 500 mL via INTRAVENOUS

## 2020-01-15 MED ORDER — HEPARIN BOLUS VIA INFUSION
5000.0000 [IU] | Freq: Once | INTRAVENOUS | Status: AC
  Administered 2020-01-15: 22:00:00 5000 [IU] via INTRAVENOUS
  Filled 2020-01-15: qty 5000

## 2020-01-15 MED ORDER — PIPERACILLIN-TAZOBACTAM 3.375 G IVPB
3.3750 g | Freq: Three times a day (TID) | INTRAVENOUS | Status: DC
  Administered 2020-01-15 – 2020-01-16 (×2): 3.375 g via INTRAVENOUS
  Filled 2020-01-15 (×4): qty 50

## 2020-01-15 MED ORDER — VANCOMYCIN HCL IN DEXTROSE 1-5 GM/200ML-% IV SOLN: 1000.0000 mg | Freq: Once | INTRAVENOUS | Status: DC

## 2020-01-15 MED ORDER — INSULIN ASPART 100 UNIT/ML ~~LOC~~ SOLN
0.0000 [IU] | Freq: Every day | SUBCUTANEOUS | Status: DC
  Administered 2020-01-16 – 2020-01-18 (×2): 2 [IU] via SUBCUTANEOUS

## 2020-01-15 MED ORDER — VANCOMYCIN HCL 2000 MG/400ML IV SOLN
2000.0000 mg | Freq: Once | INTRAVENOUS | Status: AC
  Administered 2020-01-15: 15:00:00 2000 mg via INTRAVENOUS
  Filled 2020-01-15 (×2): qty 400

## 2020-01-15 MED ORDER — ONDANSETRON HCL 4 MG PO TABS: 4.0000 mg | ORAL_TABLET | Freq: Four times a day (QID) | ORAL | Status: DC | PRN

## 2020-01-15 MED ORDER — CARVEDILOL 6.25 MG PO TABS
6.2500 mg | ORAL_TABLET | Freq: Two times a day (BID) | ORAL | Status: DC
  Administered 2020-01-15 – 2020-01-21 (×11): 6.25 mg via ORAL
  Filled 2020-01-15 (×10): qty 1

## 2020-01-15 MED ORDER — ALBUTEROL SULFATE (2.5 MG/3ML) 0.083% IN NEBU: 2.5000 mg | INHALATION_SOLUTION | Freq: Four times a day (QID) | RESPIRATORY_TRACT | Status: DC | PRN

## 2020-01-15 MED ORDER — ATORVASTATIN CALCIUM 40 MG PO TABS
40.0000 mg | ORAL_TABLET | Freq: Every day | ORAL | Status: DC
  Administered 2020-01-15 – 2020-01-20 (×6): 40 mg via ORAL
  Filled 2020-01-15 (×6): qty 1

## 2020-01-15 MED ORDER — HEPARIN (PORCINE) 25000 UT/250ML-% IV SOLN
1600.0000 [IU]/h | INTRAVENOUS | Status: DC
  Administered 2020-01-15: 2000 [IU]/h via INTRAVENOUS
  Administered 2020-01-16: 1600 [IU]/h via INTRAVENOUS
  Filled 2020-01-15 (×2): qty 250

## 2020-01-15 MED ORDER — CLOPIDOGREL BISULFATE 75 MG PO TABS: 75.0000 mg | ORAL_TABLET | Freq: Every day | ORAL | Status: DC

## 2020-01-15 MED ORDER — PIPERACILLIN-TAZOBACTAM 3.375 G IVPB 30 MIN
3.3750 g | Freq: Once | INTRAVENOUS | Status: AC
  Administered 2020-01-15: 09:00:00 3.375 g via INTRAVENOUS
  Filled 2020-01-15: qty 50

## 2020-01-15 MED ORDER — INSULIN ASPART 100 UNIT/ML ~~LOC~~ SOLN
0.0000 [IU] | Freq: Three times a day (TID) | SUBCUTANEOUS | Status: DC
  Administered 2020-01-15: 19:00:00 5 [IU] via SUBCUTANEOUS
  Administered 2020-01-16: 17:00:00 3 [IU] via SUBCUTANEOUS
  Administered 2020-01-16: 07:00:00 5 [IU] via SUBCUTANEOUS
  Administered 2020-01-17: 08:00:00 3 [IU] via SUBCUTANEOUS
  Administered 2020-01-17: 2 [IU] via SUBCUTANEOUS
  Administered 2020-01-18: 3 [IU] via SUBCUTANEOUS
  Administered 2020-01-18: 07:00:00 2 [IU] via SUBCUTANEOUS
  Administered 2020-01-18: 5 [IU] via SUBCUTANEOUS
  Administered 2020-01-19: 2 [IU] via SUBCUTANEOUS
  Administered 2020-01-19 – 2020-01-20 (×3): 3 [IU] via SUBCUTANEOUS
  Administered 2020-01-20: 12:00:00 5 [IU] via SUBCUTANEOUS
  Administered 2020-01-20 – 2020-01-21 (×2): 2 [IU] via SUBCUTANEOUS

## 2020-01-15 NOTE — Progress Notes (Signed)
Pharmacy Antibiotic Note  Robert Sanchez is a 64 y.o. male admitted on 01/15/2020 with left groin pain and redness.  Patient has a history of left fem-pop bypass 08/2018 due to critical ischemia.  Pharmacy has been consulted for vancomycin and Zosyn dosing for cellulitis.  SCr 1.32, CrCL 66 ml/min, afebrile, WBC 12.5, LA down 1.9.  Plan: Vanc 2gm IV x 1, then 1250mg  IV Q24H for AUC 497 using SCr 1.32 Zosyn EID 3.375gm IV Q8H Monitor renal fxn, clinical progress, vanc AUC as indicated  Height: 5\' 8"  (172.7 cm) Weight: 215 lb (97.5 kg) IBW/kg (Calculated) : 68.4  Temp (24hrs), Avg:98.2 F (36.8 C), Min:98.2 F (36.8 C), Max:98.2 F (36.8 C)  Recent Labs  Lab 01/15/20 0750 01/15/20 1007  WBC 12.5*  --   CREATININE 1.32*  --   LATICACIDVEN 2.1* 1.9    Estimated Creatinine Clearance: 66.5 mL/min (A) (by C-G formula based on SCr of 1.32 mg/dL (H)).    Allergies  Allergen Reactions  . Lisinopril Other (See Comments)    Syncope  . Codeine Rash    Vanc 3/16 >> Zosyn 3/16 >>  Hubert Derstine D. 4/16, PharmD, BCPS, BCCCP 01/15/2020, 12:12 PM

## 2020-01-15 NOTE — Consult Note (Addendum)
Hospital Consult    Reason for Consult:  Left foot pain and redness Requesting Physician:  Jeanell Sparrow MRN #:  [id number]  History of Present Illness: This is a 64 y.o. male who has hx of left femoral to PT bypass with reverse saphenous vein on 08/21/2018 for critical ischemia with rest pain by Dr. Carlis Abbott.  He has not followed up for surveillance as he states he has felt fine up until Sunday.   He presented to the ER this morning with c/o left foot pain and redness and this is worsening.  Vascular surgery is consulted.  He states that when he had his heart surgery, he told the doctor his foot was cold.  He then met Dr. Carlis Abbott, who revascularized his left leg.  He states that the left foot has never been right but did get better after surgery.  He states that he has shooting pains in the left foot that have gotten worse since Sunday.  He states it started turning red too.  He states he did not know that his left great toe was discolored.  He denies any claudication.  He has not had any fever or chills.  He has numbness in both feet. He states his sugars are always high.    He denies any hx of afib or recent palpitations or chest pain.   He has hx of elevated creatinine when he was admitted in 2019.  It has been consistently 1.3 for about the past 9 months.   He states he smoked for 40 years and quit after his surgeries in 2019.  He states that may smoke a little weed from time to time.  He no longer drinks.    He does continue to take his plavix and asa and statin.   His covid test is negative from today.  The pt is on a statin for cholesterol management.  The pt is on a daily aspirin.   Other AC:  Plavix The pt is not on meds for hypertension.   The pt is diabetic.   Tobacco hx:  Former-quit 2019  Past Medical History:  Diagnosis Date  . Acute pulmonary edema ([place]) 06/05/2018  . Arthritis   . COPD (chronic obstructive pulmonary disease) ([place])    " beginning stages " per pt  . Coronary artery  disease   . Diabetes mellitus without complication ([place])   . Diabetic neuropathy ([place])   . Diabetic neuropathy ([place])   . Emphysema/COPD ([place])    " beginning stages"  . GERD (gastroesophageal reflux disease)   . HOH (hard of hearing)   . Myocardial infarction ([place])   . Peripheral vascular disease [facility])     Past Surgical History:  Procedure Laterality Date  . ABDOMINAL AORTOGRAM W/LOWER EXTREMITY N/A 06/21/2018   Procedure: ABDOMINAL AORTOGRAM W/LOWER EXTREMITY;  Surgeon: [name], MD;  Location: [place] [facility];  Service: Cardiovascular;  Laterality: N/A;  . CORONARY ARTERY BYPASS GRAFT N/A 06/12/2018   Procedure: CORONARY ARTERY BYPASS GRAFTING (CABG) x 3 WITH ENDOSCOPIC HARVESTING OF RIGHT GREATER SAPHENOUS VEIN. LIMA TO LAD. SVG TO PD. SVG TO DIAGONAL.;  Surgeon: Ivin Poot, MD;  Location: [place];  Service: Open Heart Surgery;  Laterality: N/A;  . FEMORAL-TIBIAL BYPASS GRAFT Left 08/21/2018   Procedure: BYPASS GRAFT LEFT FEMORAL TO POSTERIOR TIBIAL ARTERY USING LEFT REVERSED GREAT SAPHENOUS VEIN;  Surgeon: [name], MD;  Location: [place];  Service: Vascular;  Laterality: Left;  . KNEE ARTHROSCOPY    .  PERIPHERAL VASCULAR INTERVENTION Left 06/21/2018   Procedure: PERIPHERAL VASCULAR INTERVENTION;  Surgeon: [name], MD;  Location: [place] [facility];  Service: Cardiovascular;  Laterality: Left;  . RIGHT/LEFT HEART CATH AND CORONARY ANGIOGRAPHY N/A 06/05/2018   Procedure: RIGHT/LEFT HEART CATH AND CORONARY ANGIOGRAPHY;  Surgeon: Dixie Dials, MD;  Location: [place] [facility];  Service: Cardiovascular;  Laterality: N/A;  . SPINE SURGERY    . TEE WITHOUT CARDIOVERSION N/A 06/12/2018   Procedure: TRANSESOPHAGEAL ECHOCARDIOGRAM (TEE);  Surgeon: Prescott Gum, Collier Salina, MD;  Location: [place];  Service: Open Heart Surgery;  Laterality: N/A;  . teeth extractions    . VEIN HARVEST Left 08/21/2018   Procedure: VEIN HARVEST LEFT GREAT SAPHENOUS VEIN;  Surgeon:  [name], MD;  Location: [facility];  Service: Vascular;  Laterality: Left;    Allergies  Allergen Reactions  . Lisinopril Other (See Comments)    Syncope  . Codeine Rash    Prior to Admission medications   Medication Sig Start Date End Date Taking? Authorizing Provider  aspirin EC 325 MG EC tablet Take 1 tablet (325 mg total) by mouth daily. Patient taking differently: Take 325 mg by mouth every other day.  06/20/18  Yes Barrett, Erin R, PA-C  atorvastatin (LIPITOR) 40 MG tablet Take 40 mg by mouth at bedtime. 01/03/20  Yes [provider]  carvedilol (COREG) 6.25 MG tablet Take 1 tablet (6.25 mg total) by mouth 2 (two) times daily with a meal. Patient taking differently: Take 6.25 mg by mouth daily.  09/14/19  Yes Rutherford Guys, MD  clopidogrel (PLAVIX) 75 MG tablet Take 1 tablet (75 mg total) by mouth daily. 09/14/19 09/13/20 Yes Rutherford Guys, MD  furosemide (LASIX) 40 MG tablet Take 1 tablet (40 mg total) by mouth daily. 09/14/19  Yes Rutherford Guys, MD  glipiZIDE (GLUCOTROL) 5 MG tablet Take 1 tablet (5 mg total) by mouth 2 (two) times daily before a meal. 09/20/19  Yes Rutherford Guys, MD  metFORMIN (GLUCOPHAGE) 1000 MG tablet Take 1 tablet (1,000 mg total) by mouth 2 (two) times daily with a meal. 09/14/19  Yes Rutherford Guys, MD  potassium chloride SA (KLOR-CON) 20 MEQ tablet Take 1 tablet (20 mEq total) by mouth daily. 09/14/19  Yes Rutherford Guys, MD    Social History   Socioeconomic History  . Marital status: Single    Spouse name: Not on file  . Number of children: Not on file  . Years of education: Not on file  . Highest education level: Not on file  Occupational History  . Not on file  Tobacco Use  . Smoking status: Former Smoker    Packs/day: 1.00    Years: 40.00    Pack years: 40.00    Quit date: 06/02/2018    Years since quitting: 1.6  . Smokeless tobacco: Never Used  Substance and Sexual Activity  . Alcohol use: Not Currently  .  Drug use: Not Currently  . Sexual activity: Not on file  Other Topics Concern  . Not on file  Social History Narrative  . Not on file   Social Determinants of Health   Financial Resource Strain:   . Difficulty of Paying Living Expenses:   Food Insecurity:   . Worried About Charity fundraiser in the Last Year:   . Arboriculturist in the Last Year:   Transportation Needs:   . Film/video editor (Medical):   Marland Kitchen Lack of Transportation (  Non-Medical):   Physical Activity:   . Days of Exercise per Week:   . Minutes of Exercise per Session:   Stress:   . Feeling of Stress :   Social Connections:   . Frequency of Communication with Friends and Family:   . Frequency of Social Gatherings with Friends and Family:   . Attends Religious Services:   . Active Member of Clubs or Organizations:   . Attends Archivist Meetings:   Marland Kitchen Marital Status:   Intimate Partner Violence:   . Fear of Current or Ex-Partner:   . Emotionally Abused:   Marland Kitchen Physically Abused:   . Sexually Abused:      Family History  Problem Relation Age of Onset  . Diabetes Mother   . Lung disease Mother   . Cancer Father   . Heart disease Father     ROS: '[x]'  Positive   '[ ]'  Negative   '[ ]'  All sytems reviewed and are negative  Cardiac: '[x]'  CAD with hx of CABG '[x]'  hx MI  Vascular: '[]'  pain in legs while walking '[x]'  pain in legs at rest '[]'  pain in legs at night '[]'  non-healing ulcers '[]'  hx of DVT '[]'  swelling in legs  Pulmonary: '[x]'  COPD  Neurologic: '[]'  weakness in '[]'  arms '[]'  legs '[]'  numbness in '[]'  arms '[]'  legs '[]'  hx of CVA '[]'  mini stroke '[]' difficulty speaking or slurred speech '[]'  temporary loss of vision in one eye '[]'  dizziness  Hematologic: '[]'  hx of cancer '[]'  bleeding problems '[]'  problems with blood clotting easily  Endocrine:   '[x]'  diabetes '[]'  thyroid disease  GI '[]'  vomiting blood '[]'  blood in stool  GU: '[]'  CKD/renal failure '[]'  HD--'[]'  M/W/F or '[]'  T/T/S '[]'  burning with  urination '[]'  blood in urine  Psychiatric: '[]'  anxiety '[]'  depression  Musculoskeletal: '[]'  arthritis '[]'  joint pain  Integumentary: '[]'  rashes '[]'  ulcers  Constitutional: '[]'  fever '[]'  chills   Physical Examination  Vitals:   01/15/20 0945 01/15/20 1015  BP: 110/69 (!) 116/57  Pulse: 78 79  Resp: 18 16  Temp:    SpO2: 100% 100%   Body mass index is 32.69 kg/m.  General:  WDWN in NAD Gait: Not observed HENT: WNL, normocephalic Pulmonary: normal non-labored breathing, without Rales, rhonchi,  wheezing Cardiac: regular, without  Murmurs, rubs or gallops; without carotid bruits Abdomen:  soft, NT/ND, no masses Skin: without rashes; well healed scars left leg Vascular Exam/Pulses:  Right Left  Radial 2+ (normal) 1+ (weak)  Ulnar Unable to palpate  Unable to palpate    Femoral 2+ (normal) 2+ (normal)  Popliteal Unable to palpate   Unable to palpate   DP absent absent  PT +doppler signal +doppler signal  Peroneal +doppler signal +doppler signal   Extremities: right foot without issues or wounds; left foot with erythema and ischemic looking great toe.       Musculoskeletal: no muscle wasting or atrophy  Neurologic: A&O X 3;  No focal weakness or paresthesias are detected; speech is fluent/normal Psychiatric:  The pt has Normal affect. Lymph:  No inguinal lymphadenopathy appreciated.   CBC    Component Value Date/Time   WBC 12.5 (H) 01/15/2020 0750   RBC 4.52 01/15/2020 0750   HGB 13.4 01/15/2020 0750   HCT 41.7 01/15/2020 0750   PLT 314 01/15/2020 0750   MCV 92.3 01/15/2020 0750   MCH 29.6 01/15/2020 0750   MCHC 32.1 01/15/2020 0750   RDW 13.7 01/15/2020 0750   LYMPHSABS 1.5 01/15/2020  0750   MONOABS 1.2 (H) 01/15/2020 0750   EOSABS 0.2 01/15/2020 0750   BASOSABS 0.1 01/15/2020 0750    BMET    Component Value Date/Time   NA 139 01/15/2020 0750   NA 136 09/14/2019 1701   K 4.5 01/15/2020 0750   CL 105 01/15/2020 0750   CO2 22 01/15/2020 0750    GLUCOSE 234 (H) 01/15/2020 0750   BUN 23 01/15/2020 0750   BUN 20 09/14/2019 1701   CREATININE 1.32 (H) 01/15/2020 0750   CREATININE 0.81 08/14/2014 1126   CALCIUM 9.3 01/15/2020 0750   GFRNONAA 58 (L) 01/15/2020 0750   GFRNONAA >89 08/14/2014 1126   GFRAA >60 01/15/2020 0750   GFRAA >89 08/14/2014 1126    COAGS: Lab Results  Component Value Date   INR 1.19 08/17/2018   INR 1.45 06/12/2018   INR 1.14 06/10/2018     Non-Invasive Vascular Imaging:   none  CXR 01/15/2020: IMPRESSION: Mild cardiomegaly with mild diffuse interstitial pulmonary opacity likely reflecting pulmonary edema. No focal airspace opacity.  Left foot x-ray 01/15/2020: IMPRESSION: No fracture or dislocation of the left foot. Diffuse soft tissue Edema.   ASSESSMENT/PLAN: This is a 64 y.o. male who has hx of left femoral to PT bypass with reverse saphenous vein on 08/21/2018 for critical ischemia with rest pain by Dr. Carlis Abbott who presented to the ER today with left foot pain and redness.  -pt lost to follow up after surgery-he states he felt well and didn't feel like he needed to come to the office.  -pt with + doppler signals bilateral PT/peroneal.  At his post op visit, he had a palpable left PT.  His great toe looks ischemic as if he has embolized.  He denies any palpitations or hx of afib.  His heart rate is regular but would get EKG.   -will order ABI's and arterial duplex of the LLE stat.  He is most likely going to need an aortogram with runoff with possible intervention at some point.  His creatinine today is 1.32, which is stable for him over the past year or so.   -he has quit smoking cigarettes since his heart and vascular surgery.  -Dr. Trula Slade to see pt today.   Leontine Locket, PA-C [facility] [phone]  I agree with the above.  I have seen and examined the patient.  Briefly, this is a 64 year old gentleman who is status post who is status post left femoral posterior  bypass graft with saphenous vein on 08/21/2018 by Dr. Carlis Abbott.  This was performed for rest pain.  He has been lost to follow-up.  He came to the emergency department with left foot pain and redness that has been going on since this weekend.  He states that his leg has always bothered him and cannot pinpoint a time where it changed, therefore I do not know when his bypass graft occluded.  Potentially it could have been this weekend however my suspicion is that it has been down for a while.  He has significant cellulitis in his foot.  He is being admitted to the hospital service for IV antibiotics.  We will plan for angiography tomorrow.  Hopefully he can undergo thrombolysis of his bypass graft to salvage it.  Alternatively, he may require a redo bypass or surgical exploration.  He is on aspirin and Plavix.  I will stop his Plavix tonight in case he needs surgical intervention.  I will start him on a heparin drip  which can be discontinued prior to angiography tomorrow.  He will be n.p.o. after midnight with plans for attempts at revascularization via angiography tomorrow by Dr. Carlis Abbott.  Annamarie Major

## 2020-01-15 NOTE — ED Triage Notes (Signed)
Pt reports noticing redness, warmth and swelling to L foot on Saturday. Hx of vascular issues as well.

## 2020-01-15 NOTE — ED Notes (Signed)
hospitalist at bedside

## 2020-01-15 NOTE — ED Notes (Signed)
Lunch Tray Ordered @ 1218. 

## 2020-01-15 NOTE — ED Notes (Signed)
Pt transported to vascular study  

## 2020-01-15 NOTE — H&P (Addendum)
History and Physical    Robert Sanchez:[id number] DOB: [birth year] DOA: 01/15/2020  Referring MD/NP/PA: Pattricia Boss, MD PCP: Forrest Moron, MD  Patient coming from: home   Chief Complaint: Left foot pain and redness  I have personally briefly reviewed patient's old medical records in [place]   HPI: Robert Sanchez is a 64 y.o. male with medical history significant of CAD s/p CABG, PVD s/p hypertension, hyperlipidemia, COPD, diabetes mellitus type 2 with neuropathy, chronic kidney disease stage III and GERD presents with complaints of left foot pain and redness over the last 2 to 3 days.  He previously had left femoral to posterior tibial artery grafting performed in 08/2018 by Dr. Carlis Abbott of vascular surgery.  Following the bypass surgery he reported that his foot was getting good blood flow, but he was never really able to move the lateral 3 toes much.  He notes having decreased sensation in his foot and wondered if he stepped on something to cause it to become affected.  He chronically has neuropathy, but reports that he has been having the sharp shooting pains in the left leg that have been more severe over the last few days.  Associated symptoms include decreased ability to move the first and second toes on his left foot.  ED Course: Upon admission into the emergency department patient was noted to have relatively stable vital signs.  Labs significant for WBC 12.5, BUN 23, creatinine 1.32, glucose 234, and lactic acid 2.1.  X-rays of the left foot showed subcutaneous swelling.  Patient was started on empiric antibiotics of Zosyn.  Review of Systems  Constitutional: Negative for fever and malaise/fatigue.  HENT: Negative for ear discharge and ear pain.   Eyes: Negative for photophobia and discharge.  Respiratory: Negative for cough and shortness of breath.   Cardiovascular: Positive for leg swelling. Negative for chest pain.  Gastrointestinal: Negative for abdominal pain,  diarrhea and vomiting.  Genitourinary: Negative for dysuria and hematuria.  Musculoskeletal: Positive for joint pain and myalgias.  Skin:       Positive for change in color  Neurological: Positive for sensory change.  Endo/Heme/Allergies: Does not bruise/bleed easily.  Psychiatric/Behavioral: Negative for substance abuse.    Past Medical History:  Diagnosis Date  . Acute pulmonary edema ([place]) 06/05/2018  . Arthritis   . COPD (chronic obstructive pulmonary disease) ([place])    " beginning stages " per pt  . Coronary artery disease   . Diabetes mellitus without complication ([place])   . Diabetic neuropathy ([place])   . Diabetic neuropathy ([place])   . Emphysema/COPD ([place])    " beginning stages"  . GERD (gastroesophageal reflux disease)   . HOH (hard of hearing)   . Myocardial infarction ([place])   . Peripheral vascular disease [facility])     Past Surgical History:  Procedure Laterality Date  . ABDOMINAL AORTOGRAM W/LOWER EXTREMITY N/A 06/21/2018   Procedure: ABDOMINAL AORTOGRAM W/LOWER EXTREMITY;  Surgeon: [name], MD;  Location: [place] CV LAB;  Service: Cardiovascular;  Laterality: N/A;  . CORONARY ARTERY BYPASS GRAFT N/A 06/12/2018   Procedure: CORONARY ARTERY BYPASS GRAFTING (CABG) x 3 WITH ENDOSCOPIC HARVESTING OF RIGHT GREATER SAPHENOUS VEIN. LIMA TO LAD. SVG TO PD. SVG TO DIAGONAL.;  Surgeon: Ivin Poot, MD;  Location: [place];  Service: Open Heart Surgery;  Laterality: N/A;  . FEMORAL-TIBIAL BYPASS GRAFT Left 08/21/2018   Procedure: BYPASS GRAFT LEFT FEMORAL TO POSTERIOR TIBIAL ARTERY USING LEFT REVERSED GREAT SAPHENOUS VEIN;  Surgeon: Cephus Shelling, MD;  Location: [facility] OR;  Service: Vascular;  Laterality: Left;  . KNEE ARTHROSCOPY    . PERIPHERAL VASCULAR INTERVENTION Left 06/21/2018   Procedure: PERIPHERAL VASCULAR INTERVENTION;  Surgeon: Cephus Shelling, MD;  Location: [facility] INVASIVE CV LAB;  Service: Cardiovascular;  Laterality: Left;  . RIGHT/LEFT HEART CATH AND  CORONARY ANGIOGRAPHY N/A 06/05/2018   Procedure: RIGHT/LEFT HEART CATH AND CORONARY ANGIOGRAPHY;  Surgeon: Orpah Cobb, MD;  Location: [facility] INVASIVE CV LAB;  Service: Cardiovascular;  Laterality: N/A;  . SPINE SURGERY    . TEE WITHOUT CARDIOVERSION N/A 06/12/2018   Procedure: TRANSESOPHAGEAL ECHOCARDIOGRAM (TEE);  Surgeon: Donata Clay, Theron Arista, MD;  Location: [facility] OR;  Service: Open Heart Surgery;  Laterality: N/A;  . teeth extractions    . VEIN HARVEST Left 08/21/2018   Procedure: VEIN HARVEST LEFT GREAT SAPHENOUS VEIN;  Surgeon: Cephus Shelling, MD;  Location: [facility] OR;  Service: Vascular;  Laterality: Left;     reports that he quit smoking about 19 months ago. He has a 40.00 pack-year smoking history. He has never used smokeless tobacco. He reports previous alcohol use. He reports previous drug use.  Allergies  Allergen Reactions  . Lisinopril Other (See Comments)    Syncope  . Codeine Rash    Family History  Problem Relation Age of Onset  . Diabetes Mother   . Lung disease Mother   . Cancer Father   . Heart disease Father     Prior to Admission medications   Medication Sig Start Date End Date Taking? Authorizing Provider  aspirin EC 325 MG EC tablet Take 1 tablet (325 mg total) by mouth daily. Patient taking differently: Take 325 mg by mouth every other day.  06/20/18  Yes Barrett, Erin R, PA-C  atorvastatin (LIPITOR) 40 MG tablet Take 40 mg by mouth at bedtime. 01/03/20  Yes [provider]  carvedilol (COREG) 6.25 MG tablet Take 1 tablet (6.25 mg total) by mouth 2 (two) times daily with a meal. Patient taking differently: Take 6.25 mg by mouth daily.  09/14/19  Yes Myles Lipps, MD  clopidogrel (PLAVIX) 75 MG tablet Take 1 tablet (75 mg total) by mouth daily. 09/14/19 09/13/20 Yes Myles Lipps, MD  furosemide (LASIX) 40 MG tablet Take 1 tablet (40 mg total) by mouth daily. 09/14/19  Yes Myles Lipps, MD  glipiZIDE (GLUCOTROL) 5 MG tablet Take 1 tablet (5 mg total)  by mouth 2 (two) times daily before a meal. 09/20/19  Yes Myles Lipps, MD  metFORMIN (GLUCOPHAGE) 1000 MG tablet Take 1 tablet (1,000 mg total) by mouth 2 (two) times daily with a meal. 09/14/19  Yes Myles Lipps, MD  potassium chloride SA (KLOR-CON) 20 MEQ tablet Take 1 tablet (20 mEq total) by mouth daily. 09/14/19  Yes Myles Lipps, MD    Physical Exam:  Constitutional: Elderly appearing male who is in no acute distress Vitals:   01/15/20 0900 01/15/20 0945 01/15/20 1015 01/15/20 1101  BP: 125/68 110/69 (!) 116/57 140/64  Pulse: 87 78 79 78  Resp: 16 18 16    Temp:      TempSrc:      SpO2: 100% 100% 100% 100%  Weight:      Height:       Eyes: PERRL, lids and conjunctivae normal ENMT: Mucous membranes are moist. Posterior pharynx clear of any exudate or lesions.  Neck: normal, supple, no masses, no thyromegaly Respiratory: clear to auscultation bilaterally, no wheezing, no crackles.  Normal respiratory effort. No accessory muscle use.  Cardiovascular: Regular rate and rhythm, no murmurs / rubs / gallops. No extremity edema.  DP and TP not appreciated on palpation left leg.  No carotid bruits.  Abdomen: no tenderness, no masses palpated. No hepatosplenomegaly. Bowel sounds positive.  Musculoskeletal: no clubbing / cyanosis. No joint deformity upper and lower extremities.  Decreased ability to move toes on left foot. Skin: Erythema noted of the dorsal aspect of the left foot with dark the aspect of first digit Neurologic: CN 2-12 grossly intact.  Abnormal sensation of the feet. Strength 5/5 in all 4.  Psychiatric: Normal judgment and insight. Alert and oriented x 3. Normal mood.     Labs on Admission: I have personally reviewed following labs and imaging studies  CBC: Recent Labs  Lab 01/15/20 0750  WBC 12.5*  NEUTROABS 9.4*  HGB 13.4  HCT 41.7  MCV 92.3  PLT 314   Basic Metabolic Panel: Recent Labs  Lab 01/15/20 0750  NA 139  K 4.5  CL 105  CO2 22   GLUCOSE 234*  BUN 23  CREATININE 1.32*  CALCIUM 9.3   GFR: Estimated Creatinine Clearance: 66.5 mL/min (A) (by C-G formula based on SCr of 1.32 mg/dL (H)). Liver Function Tests: Recent Labs  Lab 01/15/20 0750  AST 13*  ALT 16  ALKPHOS 68  BILITOT 0.8  PROT 7.9  ALBUMIN 3.7   No results for input(s): LIPASE, AMYLASE in the last 168 hours. No results for input(s): AMMONIA in the last 168 hours. Coagulation Profile: No results for input(s): INR, PROTIME in the last 168 hours. Cardiac Enzymes: No results for input(s): CKTOTAL, CKMB, CKMBINDEX, TROPONINI in the last 168 hours. BNP (last 3 results) No results for input(s): PROBNP in the last 8760 hours. HbA1C: No results for input(s): HGBA1C in the last 72 hours. CBG: No results for input(s): GLUCAP in the last 168 hours. Lipid Profile: No results for input(s): CHOL, HDL, LDLCALC, TRIG, CHOLHDL, LDLDIRECT in the last 72 hours. Thyroid Function Tests: No results for input(s): TSH, T4TOTAL, FREET4, T3FREE, THYROIDAB in the last 72 hours. Anemia Panel: No results for input(s): VITAMINB12, FOLATE, FERRITIN, TIBC, IRON, RETICCTPCT in the last 72 hours. Urine analysis:    Component Value Date/Time   COLORURINE AMBER (A) 08/17/2018 1400   APPEARANCEUR TURBID (A) 08/17/2018 1400   LABSPEC 1.020 08/17/2018 1400   PHURINE 5.0 08/17/2018 1400   GLUCOSEU NEGATIVE 08/17/2018 1400   HGBUR NEGATIVE 08/17/2018 1400   BILIRUBINUR NEGATIVE 08/17/2018 1400   KETONESUR NEGATIVE 08/17/2018 1400   PROTEINUR NEGATIVE 08/17/2018 1400   NITRITE NEGATIVE 08/17/2018 1400   LEUKOCYTESUR NEGATIVE 08/17/2018 1400   Sepsis Labs: No results found for this or any previous visit (from the past 240 hour(s)).   Radiological Exams on Admission: DG Chest 1 View  Result Date: 01/15/2020 CLINICAL DATA:  MI, COPD, redness, warmth and swelling to left foot EXAM: CHEST  1 VIEW COMPARISON:  05/04/2019 FINDINGS: Mild cardiomegaly status post median sternotomy  and CABG. Mild, diffuse interstitial pulmonary opacity. No focal airspace opacity. IMPRESSION: Mild cardiomegaly with mild diffuse interstitial pulmonary opacity likely reflecting pulmonary edema. No focal airspace opacity. Electronically Signed   By: Lauralyn Primes M.D.   On: 01/15/2020 09:51   DG Foot 2 Views Left  Result Date: 01/15/2020 CLINICAL DATA:  Vascular insufficiency, cellulitis EXAM: LEFT FOOT - 2 VIEW COMPARISON:  None. FINDINGS: No fracture or dislocation of the left foot. Mild first metatarsophalangeal and midfoot arthrosis. Diffuse soft  tissue edema. IMPRESSION: No fracture or dislocation of the left foot. Diffuse soft tissue edema. Electronically Signed   By: Lauralyn Primes M.D.   On: 01/15/2020 09:52    EKG: Independently reviewed.  Sinus rhythm at 73 bpm  Assessment/Plan Cellulitis of the left foot: Acute.  Patient present with redness and increased warmth of the left foot.  No clear signs of injury noted.  X-ray imaging showing diffuse soft tissue edema concerning for cellulitis.  Patient was empirically started on antibiotics of Zosyn.  Blood cultures had not been initially obtained. -Admit to a medical telemetry bed -Continue empiric antibiotics of vancomycin and Zosyn  Peripheral arterial disease, possible critical ischemia: Patient with previous history of left femoral to posterior tibial bypass with reverse saphenous vein on 08/2018 with question of critical ischemia with rest pain by Dr. Chestine Spore. -Follow-up Doppler ultrasound of the left lower extremity and ABI w/ wo TBI -Appreciate vascular surgery consultative services, we will follow-up for further recommendations  Leukocytosis and lactic acidosis: Acute.  WBC elevated at 12.5 with lactic acid initially 2.1, but cleared on repeat.  Otherwise, patient was noted to have vital signs within normal limits.   Suspect symptoms secondary to above. -Recheck CBC in a.m.  Pulmonary edema, systolic CHF exacerbation: Acute on chronic.   Chest x-ray concerning for cardiomegaly with mild interstitial edema. Patient O2 saturations maintained on room air. -Strict intake and output and daily weights -Check BNP -Give Lasix 40 mg x 1 dose now -Reassess in a.m. and determine if patient can restart on home oral 40 mg p.o. daily or needs continued IV diuresis -Consider need of repeat echocardiogram  Coronary artery disease s/p CABG -Continue aspirin and statin  Diabetes mellitus type 2: On admission blood glucose elevated to 234.  Last hemoglobin A1c noted to be 8.2 on 09/14/2019.  Home medications include glipizide 5 mg twice daily and Metformin 1000 mg twice daily. -Check hemoglobin A1c -Hypoglycemic protocol -Hold Metformin and glipizide -CBGs before every meal with moderate SSI  Chronic kidney disease stage IIIa: Patient creatinine on admission 1.32 which appears near his baseline. -Continue to monitor  Previous history of tobacco use: Patient reports quitting after having to undergo CABG  DVT prophylaxis: Lovenox Code Status: Full Family Communication: Unable to be reached by phone. Disposition Plan: To be determined Consults called: Vascular surgery Admission status: Inpatient   Clydie Braun MD [facility] Pager [phone]   If 7PM-7AM, please contact night-coverage [email or web address] Password [facility]  01/15/2020, 11:47 AM

## 2020-01-15 NOTE — Progress Notes (Signed)
ANTICOAGULATION CONSULT NOTE - Initial Consult  Pharmacy Consult for heparin Indication: ischemic leg  Allergies  Allergen Reactions  . Lisinopril Other (See Comments)    Syncope  . Codeine Rash    Patient Measurements: Height: 5\' 8"  (172.7 cm) Weight: 215 lb (97.5 kg) IBW/kg (Calculated) : 68.4 Heparin Dosing Weight: 89.1  Vital Signs: BP: 118/88 (03/16 1830) Pulse Rate: 80 (03/16 1830)  Labs: Recent Labs    01/15/20 0750  HGB 13.4  HCT 41.7  PLT 314  CREATININE 1.32*    Estimated Creatinine Clearance: 66.5 mL/min (A) (by C-G formula based on SCr of 1.32 mg/dL (H)).   Medical History: Past Medical History:  Diagnosis Date  . Acute pulmonary edema ([facility]) 06/05/2018  . Arthritis   . COPD (chronic obstructive pulmonary disease) ([facility])    " beginning stages " per pt  . Coronary artery disease   . Diabetes mellitus without complication ([facility])   . Diabetic neuropathy ([facility])   . Diabetic neuropathy ([facility])   . Emphysema/COPD ([facility])    " beginning stages"  . GERD (gastroesophageal reflux disease)   . HOH (hard of hearing)   . Myocardial infarction ([facility])   . Peripheral vascular disease ([facility])     Medications:  Scheduled:  . [START ON 01/17/2020] aspirin  325 mg Oral QODAY  . atorvastatin  40 mg Oral QHS  . carvedilol  6.25 mg Oral BID  . furosemide  40 mg Intravenous Once  . heparin  5,000 Units Intravenous Once  . insulin aspart  0-15 Units Subcutaneous TID WC  . insulin aspart  0-5 Units Subcutaneous QHS    Assessment: Robert Sanchez is a 64 y/o M with ischemic leg. He is on Plavix and aspirin but is not on any anticoagulants PTA. He did receive Lovenox 40mg  today at 77.   Back in 06/2018, pt received heparin infusion for ACS. He was given half a bolus and was was started on 1400 units/hr based on his weight. He required ~2300 units/hr to maintain therapeutic levels during that admission. Will dose close to that starting out and will adjust based on levels going  forward.  CBC WNL and no active bleeding noted.  Goal of Therapy:  Heparin level 0.3-0.7 units/ml Monitor platelets by anticoagulation protocol: Yes   Plan:  Give 5000 units bolus x 1 Start heparin infusion at 2000 units/hr Check anti-Xa level in 6 hours and daily while on heparin Continue to monitor H&H and platelets  Discontinue Lovenox  [employer], PharmD PGY1 Ambulatory Care Pharmacy Resident 01/15/2020,7:50 PM

## 2020-01-15 NOTE — ED Notes (Signed)
Date and time results received: 01/15/20   Test: Lactic Acid Critical Value: 2.1  Name of Provider Notified: Ray

## 2020-01-15 NOTE — Progress Notes (Signed)
ABI has been completed.   Preliminary results in CV Proc.   Blanch Media 01/15/2020 2:05 PM

## 2020-01-15 NOTE — ED Provider Notes (Signed)
[facility] EMERGENCY DEPARTMENT Provider Note   CSN: [id number] Arrival date & time: 01/15/20  8299     History Chief Complaint  Patient presents with  . Cellulitis    Robert Sanchez is a 64 y.o. male.  HPI    64 year old male history of peripheral vascular disease, coronary artery disease, diabetes, COPD, presents today complaining of left foot redness and pain for 3 days.  He states the redness began on Sunday.  The redness is worsening today.  He has had some pain in this foot.  He denies any fever or chills.  He states he rarely feels things in his left foot and did not think he had injured it.  He has had revascularization surgery in the left lower extremity in the past but the surgery is not recent.  He denies any cough, chest pain, nausea, vomiting, diarrhea, or exposure to any illnesses.  August 21, 2018  Preoperative diagnosis: Critical limb ischemia of the left lower extremity with rest pain  Postoperative diagnosis: Critical limb ischemia of the left lower extremity with rest pain  Procedure: 1.  Left common femoral artery to posterior tibial artery bypass with reversed ipsilateral great saphenous vein  Surgeon: Dr. Cephus Shelling, MD  Past Medical History:  Diagnosis Date  . Acute pulmonary edema (HCC) 06/05/2018  . Arthritis   . COPD (chronic obstructive pulmonary disease) (HCC)    " beginning stages " per pt  . Coronary artery disease   . Diabetes mellitus without complication (HCC)   . Diabetic neuropathy (HCC)   . Diabetic neuropathy (HCC)   . Emphysema/COPD (HCC)    " beginning stages"  . GERD (gastroesophageal reflux disease)   . HOH (hard of hearing)   . Myocardial infarction (HCC)   . Peripheral vascular disease [facility])     Patient Active Problem List   Diagnosis Date Noted  . Acute on chronic systolic CHF (congestive heart failure) (HCC) 05/04/2019  . Mild renal insufficiency 05/04/2019  . LFT elevation  05/04/2019  . PAD (peripheral artery disease) (HCC) 08/21/2018  . PVD (peripheral vascular disease) (HCC) 07/18/2018  . 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 CAD (coronary artery disease) 06/05/2018  . Acute pulmonary edema (HCC) 06/03/2018  . DKA, type 2 (HCC) 06/03/2018  . Diabetic acidosis without coma (HCC)   . Sebaceous cyst 08/23/2016  . Acute pain of right knee 08/23/2016  . Hidradenitis 08/23/2016  . Right hip pain 08/14/2014  . Diabetes mellitus type II, non insulin dependent (HCC) 11/27/2013  . Arthritis 11/27/2013  . Neuropathy in diabetes (HCC) 11/27/2013    Past Surgical History:  Procedure Laterality Date  . ABDOMINAL AORTOGRAM W/LOWER EXTREMITY N/A 06/21/2018   Procedure: ABDOMINAL AORTOGRAM W/LOWER EXTREMITY;  Surgeon: Cephus Shelling, MD;  Location: [facility] INVASIVE CV LAB;  Service: Cardiovascular;  Laterality: N/A;  . CORONARY ARTERY BYPASS GRAFT N/A 06/12/2018   Procedure: CORONARY ARTERY BYPASS GRAFTING (CABG) x 3 WITH ENDOSCOPIC HARVESTING OF RIGHT GREATER SAPHENOUS VEIN. LIMA TO LAD. SVG TO PD. SVG TO DIAGONAL.;  Surgeon: Kerin Perna, MD;  Location: [facility] OR;  Service: Open Heart Surgery;  Laterality: N/A;  . FEMORAL-TIBIAL BYPASS GRAFT Left 08/21/2018   Procedure: BYPASS GRAFT LEFT FEMORAL TO POSTERIOR TIBIAL ARTERY USING LEFT REVERSED GREAT SAPHENOUS VEIN;  Surgeon: Cephus Shelling, MD;  Location: [facility] OR;  Service: Vascular;  Laterality: Left;  . KNEE ARTHROSCOPY    . PERIPHERAL VASCULAR INTERVENTION Left 06/21/2018   Procedure: PERIPHERAL VASCULAR  INTERVENTION;  Surgeon: [name], MD;  Location: [place] CV LAB;  Service: Cardiovascular;  Laterality: Left;  . RIGHT/LEFT HEART CATH AND CORONARY ANGIOGRAPHY N/A 06/05/2018   Procedure: RIGHT/LEFT HEART CATH AND CORONARY ANGIOGRAPHY;  Surgeon: Dixie Dials, MD;  Location: [place] CV LAB;  Service: Cardiovascular;  Laterality: N/A;  . SPINE SURGERY    . TEE WITHOUT CARDIOVERSION N/A 06/12/2018    Procedure: TRANSESOPHAGEAL ECHOCARDIOGRAM (TEE);  Surgeon: Prescott Gum, Collier Salina, MD;  Location: [place];  Service: Open Heart Surgery;  Laterality: N/A;  . teeth extractions    . VEIN HARVEST Left 08/21/2018   Procedure: VEIN HARVEST LEFT GREAT SAPHENOUS VEIN;  Surgeon: [name], MD;  Location: [facility] OR;  Service: Vascular;  Laterality: Left;       Family History  Problem Relation Age of Onset  . Diabetes Mother   . Lung disease Mother   . Cancer Father   . Heart disease Father     Social History   Tobacco Use  . Smoking status: Former Smoker    Packs/day: 1.00    Years: 40.00    Pack years: 40.00    Quit date: 06/02/2018    Years since quitting: 1.6  . Smokeless tobacco: Never Used  Substance Use Topics  . Alcohol use: Not Currently  . Drug use: Not Currently    Home Medications Prior to Admission medications   Medication Sig Start Date End Date Taking? Authorizing Provider  aspirin EC 325 MG EC tablet Take 1 tablet (325 mg total) by mouth daily. 06/20/18   Barrett, Erin R, PA-C  atorvastatin (LIPITOR) 80 MG tablet Take 1 tablet (80 mg total) by mouth daily at 6 PM. 09/20/19   Rutherford Guys, MD  carvedilol (COREG) 6.25 MG tablet Take 1 tablet (6.25 mg total) by mouth 2 (two) times daily with a meal. 09/14/19   Rutherford Guys, MD  clopidogrel (PLAVIX) 75 MG tablet Take 1 tablet (75 mg total) by mouth daily. 09/14/19 09/13/20  Rutherford Guys, MD  furosemide (LASIX) 40 MG tablet Take 1 tablet (40 mg total) by mouth daily. 09/14/19   Rutherford Guys, MD  glipiZIDE (GLUCOTROL) 5 MG tablet Take 1 tablet (5 mg total) by mouth 2 (two) times daily before a meal. 09/20/19   Rutherford Guys, MD  metFORMIN (GLUCOPHAGE) 1000 MG tablet Take 1 tablet (1,000 mg total) by mouth 2 (two) times daily with a meal. 09/14/19   Rutherford Guys, MD  potassium chloride SA (KLOR-CON) 20 MEQ tablet Take 1 tablet (20 mEq total) by mouth daily. 09/14/19   Rutherford Guys, MD    Allergies      Lisinopril and Codeine  Review of Systems   Review of Systems  All other systems reviewed and are negative.   Physical Exam Updated Vital Signs BP 135/75 (BP Location: Left Arm)   Pulse 87   Temp 98.2 F (36.8 C) (Oral)   Resp 18   Ht 1.727 m (5\' 8" )   Wt 97.5 kg   SpO2 98%   BMI 32.69 kg/m   Physical Exam Vitals and nursing note reviewed.  Constitutional:      Appearance: Normal appearance. He is normal weight.     Comments: Pleasant male sitting on the bed conversant and in no acute distress  HENT:     Head: Normocephalic.     Right Ear: External ear normal.     Left Ear: External ear normal.  Nose: Nose normal.     Mouth/Throat:     Mouth: Mucous membranes are moist.  Eyes:     Extraocular Movements: Extraocular movements intact.     Pupils: Pupils are equal, round, and reactive to light.  Cardiovascular:     Rate and Rhythm: Regular rhythm. Bradycardia present.  Pulmonary:     Effort: Pulmonary effort is normal.     Comments: Decreased breath sounds throughout with some inspiratory wheezes noted Abdominal:     General: Bowel sounds are normal. There is no distension.     Palpations: Abdomen is soft.     Tenderness: There is no abdominal tenderness.  Musculoskeletal:        General: Normal range of motion.     Cervical back: Normal range of motion.     Comments: Erythema of left foot spreading into the distal lower leg.  Dorsal totalis pulse is intact.  Left great toe is blue and cool.  Skin:    General: Skin is warm.     Capillary Refill: Capillary refill takes less than 2 seconds.  Neurological:     General: No focal deficit present.     Mental Status: He is alert and oriented to person, place, and time.  Psychiatric:        Mood and Affect: Mood normal.     ED Results / Procedures / Treatments   Labs (all labs ordered are listed, but only abnormal results are displayed) Labs Reviewed  LACTIC ACID, PLASMA - Abnormal; Notable for the following  components:      Result Value   Lactic Acid, Venous 2.1 (*)    All other components within normal limits  CBC WITH DIFFERENTIAL/PLATELET - Abnormal; Notable for the following components:   WBC 12.5 (*)    Neutro Abs 9.4 (*)    Monocytes Absolute 1.2 (*)    All other components within normal limits  SARS CORONAVIRUS 2 (TAT 6-24 HRS)  LACTIC ACID, PLASMA  COMPREHENSIVE METABOLIC PANEL    EKG None  Radiology No results found.  Procedures Procedures (including critical care time)  Medications Ordered in ED Medications  piperacillin-tazobactam (ZOSYN) IVPB 3.375 g (has no administration in time range)  sodium chloride 0.9 % bolus 500 mL (has no administration in time range)    ED Course  I have reviewed the triage vital signs and the nursing notes.  Pertinent labs & imaging results that were available during my care of the patient were reviewed by me and considered in my medical decision making (see chart for details). Discussed with Dr. Myra Gianotti and vascular surgery will see for ischemic toe Discussed work-up and findings with Dr. Katrinka Blazing.  He will see for admission.   MDM Rules/Calculators/A&P                     Patient with cellulitis left foot.  Here initial lactic acid 2.1.  Patient received 500 cc normal saline, IV antibiotics, and repeat lactic acid is 1.9.  He remains hemodynamically stable.  Patient care discussed with both vascular surgery and hospitalist.  Patient will be admitted for further monitoring, treatment, and evaluation  Final Clinical Impression(s) / ED Diagnoses Final diagnoses:  Cellulitis of left lower extremity  Ischemic toe    Rx / DC Orders ED Discharge Orders    None       Margarita Grizzle, MD 01/15/20 1100

## 2020-01-15 NOTE — ED Notes (Signed)
Pt transported to xray 

## 2020-01-15 NOTE — ED Notes (Signed)
Vascular at bedside

## 2020-01-16 ENCOUNTER — Encounter (HOSPITAL_COMMUNITY)
Admission: EM | Disposition: A | Discharge status: Home / Self Care | Payer: Self-pay | Source: Home / Self Care | Attending: Family Medicine

## 2020-01-16 HISTORY — PX: PERIPHERAL VASCULAR THROMBECTOMY: CATH118306

## 2020-01-16 HISTORY — PX: ABDOMINAL AORTOGRAM W/LOWER EXTREMITY: CATH118223

## 2020-01-16 LAB — GLUCOSE, CAPILLARY
Glucose-Capillary: 211 mg/dL — ABNORMAL HIGH (ref 70–99)
Glucose-Capillary: 139 mg/dL — ABNORMAL HIGH (ref 70–99)
Glucose-Capillary: 198 mg/dL — ABNORMAL HIGH (ref 70–99)
Glucose-Capillary: 205 mg/dL — ABNORMAL HIGH (ref 70–99)

## 2020-01-16 LAB — CBC
HCT: 40.2 % (ref 39.0–52.0)
HCT: 41.1 % (ref 39.0–52.0)
HCT: 38.8 % — ABNORMAL LOW (ref 39.0–52.0)
Hemoglobin: 13.1 g/dL (ref 13.0–17.0)
Hemoglobin: 13.3 g/dL (ref 13.0–17.0)
Hemoglobin: 12.7 g/dL — ABNORMAL LOW (ref 13.0–17.0)
MCH: 30 pg (ref 26.0–34.0)
MCH: 30 pg (ref 26.0–34.0)
MCH: 30.2 pg (ref 26.0–34.0)
MCHC: 32.6 g/dL (ref 30.0–36.0)
MCHC: 32.4 g/dL (ref 30.0–36.0)
MCHC: 32.7 g/dL (ref 30.0–36.0)
MCV: 92 fL (ref 80.0–100.0)
MCV: 92.6 fL (ref 80.0–100.0)
MCV: 92.2 fL (ref 80.0–100.0)
Platelets: 278 10*3/uL (ref 150–400)
Platelets: 287 10*3/uL (ref 150–400)
Platelets: 273 10*3/uL (ref 150–400)
RBC: 4.37 MIL/uL (ref 4.22–5.81)
RBC: 4.44 MIL/uL (ref 4.22–5.81)
RBC: 4.21 MIL/uL — ABNORMAL LOW (ref 4.22–5.81)
RDW: 13.8 % (ref 11.5–15.5)
RDW: 13.9 % (ref 11.5–15.5)
RDW: 13.9 % (ref 11.5–15.5)
WBC: 11.7 10*3/uL — ABNORMAL HIGH (ref 4.0–10.5)
WBC: 11.2 10*3/uL — ABNORMAL HIGH (ref 4.0–10.5)
WBC: 10.7 10*3/uL — ABNORMAL HIGH (ref 4.0–10.5)
nRBC: 0 % (ref 0.0–0.2)
nRBC: 0 % (ref 0.0–0.2)
nRBC: 0 % (ref 0.0–0.2)

## 2020-01-16 LAB — BASIC METABOLIC PANEL
Anion gap: 15 (ref 5–15)
BUN: 26 mg/dL — ABNORMAL HIGH (ref 8–23)
CO2: 20 mmol/L — ABNORMAL LOW (ref 22–32)
Calcium: 9 mg/dL (ref 8.9–10.3)
Chloride: 101 mmol/L (ref 98–111)
Creatinine, Ser: 1.2 mg/dL (ref 0.61–1.24)
GFR calc Af Amer: >60 mL/min (ref >60)
GFR calc non Af Amer: >60 mL/min (ref >60)
Glucose, Bld: 174 mg/dL — ABNORMAL HIGH (ref 70–99)
Potassium: 4.3 mmol/L (ref 3.5–5.1)
Sodium: 136 mmol/L (ref 135–145)

## 2020-01-16 LAB — HEPARIN LEVEL (UNFRACTIONATED)
Heparin Unfractionated: 1.09 IU/mL — ABNORMAL HIGH (ref 0.30–0.70)
Heparin Unfractionated: 0.12 IU/mL — ABNORMAL LOW (ref 0.30–0.70)
Heparin Unfractionated: 0.12 IU/mL — ABNORMAL LOW (ref 0.30–0.70)

## 2020-01-16 LAB — FIBRINOGEN
Fibrinogen: 531 mg/dL — ABNORMAL HIGH (ref 210–475)
Fibrinogen: 493 mg/dL — ABNORMAL HIGH (ref 210–475)

## 2020-01-16 LAB — MRSA PCR SCREENING: MRSA by PCR: NEGATIVE

## 2020-01-16 LAB — PROTIME-INR
INR: 1.2 (ref 0.8–1.2)
Prothrombin Time: 15.1 seconds (ref 11.4–15.2)

## 2020-01-16 SURGERY — ABDOMINAL AORTOGRAM W/LOWER EXTREMITY
Anesthesia: LOCAL | Laterality: Left

## 2020-01-16 MED ORDER — SODIUM CHLORIDE 0.9 % IV SOLN: 250.0000 mL | INTRAVENOUS | Status: DC | PRN

## 2020-01-16 MED ORDER — SODIUM CHLORIDE 0.9 % IV SOLN
1.0000 mg/h | INTRAVENOUS | Status: DC
  Administered 2020-01-16 – 2020-01-17 (×3): 1 mg/h
  Filled 2020-01-16 (×6): qty 10

## 2020-01-16 MED ORDER — HEPARIN SODIUM (PORCINE) 1000 UNIT/ML IJ SOLN
INTRAMUSCULAR | Status: DC | PRN
  Administered 2020-01-16: 8000 [IU] via INTRAVENOUS

## 2020-01-16 MED ORDER — MORPHINE SULFATE (PF) 4 MG/ML IV SOLN
INTRAVENOUS | Status: AC
  Filled 2020-01-16: qty 2

## 2020-01-16 MED ORDER — SODIUM CHLORIDE 0.9 % IV SOLN: INTRAVENOUS | Status: DC

## 2020-01-16 MED ORDER — LIDOCAINE HCL (PF) 1 % IJ SOLN
INTRAMUSCULAR | Status: AC
  Filled 2020-01-16: qty 30

## 2020-01-16 MED ORDER — LIDOCAINE HCL (PF) 1 % IJ SOLN
INTRAMUSCULAR | Status: DC | PRN
  Administered 2020-01-16: 15 mL

## 2020-01-16 MED ORDER — MUPIROCIN 2 % EX OINT
1.0000 "application " | TOPICAL_OINTMENT | Freq: Two times a day (BID) | CUTANEOUS | Status: DC
  Administered 2020-01-16 – 2020-01-21 (×9): 1 via NASAL
  Filled 2020-01-16 (×2): qty 22

## 2020-01-16 MED ORDER — MIDAZOLAM HCL 2 MG/2ML IJ SOLN: 1.0000 mg | INTRAMUSCULAR | Status: DC | PRN

## 2020-01-16 MED ORDER — HEPARIN (PORCINE) 25000 UT/250ML-% IV SOLN
1450.0000 [IU]/h | INTRAVENOUS | Status: DC
  Filled 2020-01-16: qty 250

## 2020-01-16 MED ORDER — VANCOMYCIN HCL 1250 MG/250ML IV SOLN
1250.0000 mg | INTRAVENOUS | Status: DC
  Administered 2020-01-18 – 2020-01-19 (×2): 1250 mg via INTRAVENOUS
  Filled 2020-01-16 (×4): qty 250

## 2020-01-16 MED ORDER — FENTANYL CITRATE (PF) 100 MCG/2ML IJ SOLN
INTRAMUSCULAR | Status: DC | PRN
  Administered 2020-01-16: 25 ug via INTRAVENOUS

## 2020-01-16 MED ORDER — MIDAZOLAM HCL 2 MG/2ML IJ SOLN
INTRAMUSCULAR | Status: AC
  Filled 2020-01-16: qty 2

## 2020-01-16 MED ORDER — HEPARIN SODIUM (PORCINE) 1000 UNIT/ML IJ SOLN
INTRAMUSCULAR | Status: AC
  Filled 2020-01-16: qty 1

## 2020-01-16 MED ORDER — HEPARIN (PORCINE) IN NACL 1000-0.9 UT/500ML-% IV SOLN
INTRAVENOUS | Status: AC
  Filled 2020-01-16: qty 1000

## 2020-01-16 MED ORDER — HEPARIN (PORCINE) IN NACL 1000-0.9 UT/500ML-% IV SOLN
INTRAVENOUS | Status: DC | PRN
  Administered 2020-01-16 (×2): 500 mL

## 2020-01-16 MED ORDER — CHLORHEXIDINE GLUCONATE CLOTH 2 % EX PADS: 6.0000 | MEDICATED_PAD | Freq: Every day | CUTANEOUS | Status: DC

## 2020-01-16 MED ORDER — PIPERACILLIN-TAZOBACTAM 3.375 G IVPB
3.3750 g | Freq: Three times a day (TID) | INTRAVENOUS | Status: DC
  Administered 2020-01-16 – 2020-01-21 (×15): 3.375 g via INTRAVENOUS
  Filled 2020-01-16 (×13): qty 50

## 2020-01-16 MED ORDER — SODIUM CHLORIDE 0.9 % IV SOLN: 1.0000 mg/h | INTRAVENOUS | Status: DC

## 2020-01-16 MED ORDER — FENTANYL CITRATE (PF) 100 MCG/2ML IJ SOLN
INTRAMUSCULAR | Status: AC
  Filled 2020-01-16: qty 2

## 2020-01-16 MED ORDER — IODIXANOL 320 MG/ML IV SOLN
INTRAVENOUS | Status: DC | PRN
  Administered 2020-01-16: 95 mL via INTRA_ARTERIAL

## 2020-01-16 MED ORDER — CHLORHEXIDINE GLUCONATE CLOTH 2 % EX PADS
6.0000 | MEDICATED_PAD | Freq: Every day | CUTANEOUS | Status: DC
  Administered 2020-01-19 – 2020-01-21 (×3): 6 via TOPICAL

## 2020-01-16 MED ORDER — MORPHINE SULFATE (PF) 10 MG/ML IV SOLN
5.0000 mg | INTRAVENOUS | Status: DC | PRN
  Administered 2020-01-16 (×2): 5 mg via INTRAVENOUS

## 2020-01-16 MED ORDER — MIDAZOLAM HCL 2 MG/2ML IJ SOLN
INTRAMUSCULAR | Status: DC | PRN
  Administered 2020-01-16: 1 mg via INTRAVENOUS

## 2020-01-16 SURGICAL SUPPLY — 18 items
CATH BEACON 5 .035 65 KMP TIP (CATHETERS) ×1 IMPLANT
CATH INFUS 135CMX30CM (CATHETERS) IMPLANT
CATH INFUS 135CMX50CM (CATHETERS) ×1 IMPLANT
CATH OMNI FLUSH 5F 65CM (CATHETERS) ×1 IMPLANT
CATH TEMPO AQUA 5F 100CM (CATHETERS) ×1 IMPLANT
COVER DOME SNAP 22 D (MISCELLANEOUS) ×1 IMPLANT
DEVICE TORQUE .025-.038 (MISCELLANEOUS) ×1 IMPLANT
GLIDEWIRE ADV .035X260CM (WIRE) ×1 IMPLANT
KIT MICROPUNCTURE NIT STIFF (SHEATH) ×2 IMPLANT
KIT PV (KITS) ×2 IMPLANT
SHEATH FLEX ANSEL ST 6FR 45CM (SHEATH) ×1 IMPLANT
SHEATH PINNACLE 5F 10CM (SHEATH) ×1 IMPLANT
SHEATH PROBE COVER 6X72 (BAG) ×1 IMPLANT
SYR MEDRAD MARK V 150ML (SYRINGE) ×1 IMPLANT
TRANSDUCER W/STOPCOCK (MISCELLANEOUS) ×2 IMPLANT
TRAY PV CATH (CUSTOM PROCEDURE TRAY) ×2 IMPLANT
WIRE AMPLATZ SS-J .035X260CM (WIRE) ×1 IMPLANT
WIRE J 3MM .035X145CM (WIRE) ×1 IMPLANT

## 2020-01-16 NOTE — Op Note (Signed)
Patient name: Robert Sanchez MRN: [id number] DOB: 1956/10/26 Sex: male  01/16/2020 Pre-operative Diagnosis: Critical limb ischemia of the left lower extremity with occluded common femoral to posterior tibial artery vein bypass Post-operative diagnosis:  Same Surgeon:  [name], MD Procedure Performed: 1.  Ultrasound-guided access of the right common femoral artery 2.  Aortogram 3.  Left lower extremity arteriogram with selection of third order branches 4.  Placement of thrombolytic's catheter in left lower extremity bypass with initiation of thrombolysis  5.  61 minutes of monitored moderate conscious sedation time  Indications: Patient is a 64 year old male who presented to the ED yesterday with 3 days of worsening left foot pain and cellulitis.  He has a known left common femoral artery to posterior tibial artery bypass with saphenous vein that was placed in 2019.  He has not been seen in follow-up and he missed all of his surveillance appointments.  He was found to have an occluded bypass and presents today for attempted thrombolysis after risks and benefits were discussed.  Findings:   Aortogram showed that his left external iliac artery stent only has about a 20% stenosis with no overt flow limitation.  There is a calcified plaque in the proximal left external iliac artery.  No other hemodynamically significant aortoiliac lesions.  The left common femoral and profunda remained patent on the left.  There is a short stump of SFA and then the SFA is occluded for long segment.  The left common femoral to PT bypass is occluded throughout its course and a small stump of bypass was visualized off the common femoral artery.  He does reconstitute a distal SFA and above-knee popliteal artery that is heavily diseased and has a patent below-knee popliteal artery but also looks heavily diseased.  Dominant runoff in the left lower extremities via the peroneal and posterior tibial artery  with a high-grade stenosis in the proximal AT.  Ultimately the bypass in the left lower extremity was selected and placed a long 136 cm Unifuse catheter with 50 cm infusion length into the bypass and will initiate thrombolysis overnight.   Procedure:  The patient was identified in the holding area and taken to room 8.  The patient was then placed supine on the table and prepped and draped in the usual sterile fashion.  A time out was called.  Ultrasound was used to evaluate the right common femoral artery.  It was patent .  A digital ultrasound image was acquired.  A micropuncture needle was used to access the right common femoral artery under ultrasound guidance.  An 018 wire was advanced without resistance and a micropuncture sheath was placed.  The 018 wire was removed and a J wire was placed.  The micropuncture sheath was exchanged for a 5 french sheath.  An omniflush catheter was advanced over the wire to the level of L-1.  An abdominal angiogram was obtained.  Next the Omni Flush catheter was used to select the left iliac and the catheter was advanced to the distal left external iliac.  Left lower extremity runoff was then obtained with pertinent findings noted above.  Ultimately the left common femoral to posterior tibial artery vein bypass is occluded throughout its course.  Subsequently exchanged for a KMP catheter with a Glidewire advantage in order to identify the takeoff of the bypass and we were finally able to get a wire into and cannulate the bypass and got a wire all the way down into the distal calf  of the bypass.  That point in time patient was given 8000 units of IV heparin.  I then exchanged for a 6 [place] long Ansell sheath in the right groin over the aortic bifurcation into the left common femoral.  Over this we then tried to advance a UniFuse catheter but had some trouble advancing the Unifuse catheter and our sheath kept kicking up into the aorta.  I then exchanged with a long 100 cm  flush catheter for an Amplatz wire for more support down the bypass.  Finally able to place a long 135 cm UniFuse catheter with 50 cm infusion length into the bypass.  Alteplase will run at 1 mg/hr through bypass and heparin at 800 units/hr through sheath.  Will admit to ICU for thrombolysis overnight.  The right groin sheath was secured.  Pain: Patient will return tomorrow for thrombolysis catheter check.  Cephus Shelling, MD [facility] Office: [phone]

## 2020-01-16 NOTE — Progress Notes (Signed)
The patient transferred to to heart for IV thrombolytics from OR.  Patient not seen today, will follow and see patient in a.m if still on [facility] service

## 2020-01-16 NOTE — Progress Notes (Signed)
ANTICOAGULATION CONSULT NOTE  Pharmacy Consult for heparin Indication: ischemic leg  Allergies  Allergen Reactions  . Lisinopril Other (See Comments)    Syncope  . Codeine Rash    Patient Measurements: Height: 5\' 9"  (175.3 cm) Weight: 215 lb 8 oz (97.8 kg)(scale a) IBW/kg (Calculated) : 70.7 Heparin Dosing Weight: 89.1  Vital Signs: Temp: 98 F (36.7 C) (03/17 0051) Temp Source: Oral (03/17 0051) BP: 107/63 (03/17 0051) Pulse Rate: 76 (03/17 0051)  Labs: Recent Labs    01/15/20 0750 01/16/20 0244  HGB 13.4 13.1  HCT 41.7 40.2  PLT 314 278  HEPARINUNFRC  --  1.09*  CREATININE 1.32* 1.20    Estimated Creatinine Clearance: 74.5 mL/min (by C-G formula based on SCr of 1.2 mg/dL).   Assessment: 64 y.o. male with LLE ischemia awaiting angiogram/thrombolysis of bypass graft, for heparin  Goal of Therapy:  Heparin level 0.3-0.7 units/ml Monitor platelets by anticoagulation protocol: Yes   Plan:  Decrease Heparin 1600 units/hr F/U after procedure  77, PharmD, BCPS  01/16/2020,3:38 AM

## 2020-01-16 NOTE — Progress Notes (Signed)
[facility]  Subjective  - Left lower extremity bypass occluded   Objective 122/73 76 98.1 F (36.7 C) (Oral) 16 100%  Intake/Output Summary (Last 24 hours) at 01/16/2020 0840 Last data filed at 01/16/2020 0600 Gross per 24 hour  Intake 240 ml  Output 2175 ml  Net -1935 ml    Left PT peroneal signal Left foot red and cellulitic  Laboratory Lab Results: Recent Labs    01/15/20 0750 01/16/20 0244  WBC 12.5* 11.7*  HGB 13.4 13.1  HCT 41.7 40.2  PLT 314 278   BMET Recent Labs    01/15/20 0750 01/16/20 0244  NA 139 136  K 4.5 4.3  CL 105 101  CO2 22 20*  GLUCOSE 234* 174*  BUN 23 26*  CREATININE 1.32* 1.20  CALCIUM 9.3 9.0    COAG Lab Results  Component Value Date   INR 1.2 01/16/2020   INR 1.19 08/17/2018   INR 1.45 06/12/2018   No results found for: PTT  Assessment/Planning:  Plan left lower extremity arteriogram and possible thrombolysis given occluded bypass.  Unclear timeline in discussion with patient.  Risks and benefits discussed.  Cephus Shelling 01/16/2020 8:40 AM --

## 2020-01-16 NOTE — Progress Notes (Signed)
ANTICOAGULATION CONSULT NOTE  Pharmacy Consult for heparin Indication: ischemic leg  Allergies  Allergen Reactions  . Lisinopril Other (See Comments)    Syncope  . Codeine Rash    Patient Measurements: Height: 5\' 9"  (175.3 cm) Weight: 215 lb 8 oz (97.8 kg)(scale a) IBW/kg (Calculated) : 70.7 Heparin Dosing Weight: 89.1  Vital Signs: Temp: 98.1 F (36.7 C) (03/17 0457) Temp Source: Oral (03/17 0457) BP: 108/66 (03/17 1230) Pulse Rate: 65 (03/17 1230)  Labs: Recent Labs    01/15/20 0750 01/16/20 0244  HGB 13.4 13.1  HCT 41.7 40.2  PLT 314 278  LABPROT  --  15.1  INR  --  1.2  HEPARINUNFRC  --  1.09*  CREATININE 1.32* 1.20    Estimated Creatinine Clearance: 74.5 mL/min (by C-G formula based on SCr of 1.2 mg/dL).   Assessment: 64 y.o. male with LLE ischemia awaiting angiogram/thrombolysis of bypass graft, for heparin.   S/p LLE arteriogram and placement of thrombolytic's catheter w/ initiation of thrombolysis 3/17. Was previously on heparin infusion at 1600 units/hr - restarted at 800 units/hr post-procedure. Hgb 13.1, plt 278. No s/sx of bleeding.  Goal of Therapy:  Heparin level 0.3-0.7 units/ml Monitor platelets by anticoagulation protocol: Yes   Plan:  Continue heparin infusion at 800 units/hr Order heparin level in 6 hours  Monitor daily HL, CBC, and for s/sx of bleeding   4/17, PharmD, BCCCP Clinical Pharmacist  Phone: [phone]  Please check AMION for all [facility] Pharmacy phone numbers After 10:00 PM, call Main Pharmacy [phone] 01/16/2020,1:46 PM

## 2020-01-16 NOTE — Progress Notes (Signed)
ANTICOAGULATION CONSULT NOTE - Follow Up Consult  Pharmacy Consult for heparin Indication: ischemic leg  Labs: Recent Labs    01/15/20 0750 01/15/20 0750 01/16/20 0244 01/16/20 0244 01/16/20 1920 01/16/20 2229  HGB 13.4   < > 13.1   < > 13.3 12.7*  HCT 41.7   < > 40.2  --  41.1 38.8*  PLT 314   < > 278  --  287 273  LABPROT  --   --  15.1  --   --   --   INR  --   --  1.2  --   --   --   HEPARINUNFRC  --   --  1.09*  --  0.12* 0.12*  CREATININE 1.32*  --  1.20  --   --   --    < > = values in this interval not displayed.    Assessment: 63yo male subtherapeutic on heparin after rate change though lab was drawn early; no gtt issues or signs of bleeding per RN.  Goal of Therapy:  Heparin level 0.2-0.5 units/ml   Plan:  Will increase heparin gtt slightly to 1200 units/hr and check level with next lab draw.    Vernard Gambles, PharmD, BCPS  01/16/2020,11:31 PM

## 2020-01-16 NOTE — Progress Notes (Signed)
Inpatient Diabetes Program Recommendations  AACE/ADA: New Consensus Statement on Inpatient Glycemic Control   Target Ranges:  Prepandial:   less than 140 mg/dL      Peak postprandial:   less than 180 mg/dL (1-2 hours)      Critically ill patients:  140 - 180 mg/dL   Results for Robert Sanchez, Robert Sanchez (MRN [id number]) as of 01/16/2020 10:13  Ref. Range 01/15/2020 18:32 01/15/2020 21:32 01/16/2020 06:00  Glucose-Capillary Latest Ref Range: 70 - 99 mg/dL 875 (H) 797 (H) 282 (H)  Results for Robert Sanchez, Robert Sanchez (MRN [id number]) as of 01/16/2020 10:13  Ref. Range 09/14/2019 17:01 01/15/2020 12:18  Hemoglobin A1C Latest Ref Range: 4.8 - 5.6 % 8.2 (H) 10.0 (H)   Review of Glycemic Control  Diabetes history: DM2 Outpatient Diabetes medications: Metformin 1000 mg BID, Glipizide 5 mg BID Current orders for Inpatient glycemic control: Novolog 0-15 units TID with meals, Novolog 0-5 units QHS  Inpatient Diabetes Program Recommendations:   Insulin-Meal Coverage: Please consider ordering Novolog 4 units TID with meals for meal coverage if patient eats at least 50% of meals.  HbgA1C:  A1C 10% on 01/15/20 indicating an average glucose 240 mg/dl to evaluate glycemic control over the past 2-3 months.  Thanks, Orlando Penner, RN, MSN, CDE Diabetes Coordinator Inpatient Diabetes Program [phone] (Team Pager from 8am to 5pm)

## 2020-01-16 NOTE — Progress Notes (Signed)
ANTICOAGULATION CONSULT NOTE  Pharmacy Consult for heparin Indication: ischemic leg  Allergies  Allergen Reactions  . Lisinopril Other (See Comments)    Syncope  . Codeine Rash    Patient Measurements: Height: 5\' 9"  (175.3 cm) Weight: 215 lb 8 oz (97.8 kg)(scale a) IBW/kg (Calculated) : 70.7 Heparin Dosing Weight: 89.1  Vital Signs: Temp: 97.7 F (36.5 C) (03/17 1939) Temp Source: Oral (03/17 1939) BP: 119/76 (03/17 2000) Pulse Rate: 79 (03/17 2000)  Labs: Recent Labs    01/15/20 0750 01/15/20 0750 01/16/20 0244 01/16/20 1920  HGB 13.4   < > 13.1 13.3  HCT 41.7  --  40.2 41.1  PLT 314  --  278 287  LABPROT  --   --  15.1  --   INR  --   --  1.2  --   HEPARINUNFRC  --   --  1.09* 0.12*  CREATININE 1.32*  --  1.20  --    < > = values in this interval not displayed.    Estimated Creatinine Clearance: 74.5 mL/min (by C-G formula based on SCr of 1.2 mg/dL).   Assessment: 64 y.o. male with LLE ischemia awaiting angiogram/thrombolysis of bypass graft, for heparin.   S/p LLE arteriogram and placement of thrombolytic's catheter w/ initiation of thrombolysis 3/17. Was previously on heparin infusion at 1600 units/hr with elevated HL - restarted post procedure at 800 units/hr HL 0.12 < goal. Hgb 13.1, plt 278. Fibrinogen 533 No s/sx of bleeding.  Goal of Therapy:  Heparin level 0.2-0.5 while on TPA Monitor platelets by anticoagulation protocol: Yes   Plan:  Increase heparin infusion at 1100 units/hr  heparin level and fibrinogen every  6 hours  Monitor or s/sx of bleeding     Please check AMION for all [facility] Pharmacy phone numbers After 10:00 PM, call Main Pharmacy [phone] 01/16/2020,8:55 PM

## 2020-01-17 ENCOUNTER — Inpatient Hospital Stay (HOSPITAL_COMMUNITY)
Admission: EM | Disposition: A | Discharge status: Home / Self Care | Payer: Self-pay | Source: Home / Self Care | Attending: Family Medicine

## 2020-01-17 HISTORY — PX: PERIPHERAL VASCULAR BALLOON ANGIOPLASTY: CATH118281

## 2020-01-17 HISTORY — PX: LOWER EXTREMITY ANGIOGRAPHY: CATH118251

## 2020-01-17 HISTORY — PX: PERIPHERAL VASCULAR THROMBECTOMY: CATH118306

## 2020-01-17 LAB — CBC
HCT: 37.1 % — ABNORMAL LOW (ref 39.0–52.0)
Hemoglobin: 12.2 g/dL — ABNORMAL LOW (ref 13.0–17.0)
MCH: 30.1 pg (ref 26.0–34.0)
MCHC: 32.9 g/dL (ref 30.0–36.0)
MCV: 91.6 fL (ref 80.0–100.0)
Platelets: 271 10*3/uL (ref 150–400)
RBC: 4.05 MIL/uL — ABNORMAL LOW (ref 4.22–5.81)
RDW: 13.8 % (ref 11.5–15.5)
WBC: 10.2 10*3/uL (ref 4.0–10.5)
nRBC: 0 % (ref 0.0–0.2)

## 2020-01-17 LAB — BASIC METABOLIC PANEL
Anion gap: 12 (ref 5–15)
BUN: 20 mg/dL (ref 8–23)
CO2: 22 mmol/L (ref 22–32)
Calcium: 8.8 mg/dL — ABNORMAL LOW (ref 8.9–10.3)
Chloride: 104 mmol/L (ref 98–111)
Creatinine, Ser: 1.38 mg/dL — ABNORMAL HIGH (ref 0.61–1.24)
GFR calc Af Amer: >60 mL/min (ref >60)
GFR calc non Af Amer: 55 mL/min — ABNORMAL LOW (ref >60)
Glucose, Bld: 165 mg/dL — ABNORMAL HIGH (ref 70–99)
Potassium: 4.3 mmol/L (ref 3.5–5.1)
Sodium: 138 mmol/L (ref 135–145)

## 2020-01-17 LAB — HEPARIN LEVEL (UNFRACTIONATED)
Heparin Unfractionated: 0.12 IU/mL — ABNORMAL LOW (ref 0.30–0.70)
Heparin Unfractionated: 0.43 IU/mL (ref 0.30–0.70)

## 2020-01-17 LAB — GLUCOSE, CAPILLARY
Glucose-Capillary: 160 mg/dL — ABNORMAL HIGH (ref 70–99)
Glucose-Capillary: 152 mg/dL — ABNORMAL HIGH (ref 70–99)
Glucose-Capillary: 148 mg/dL — ABNORMAL HIGH (ref 70–99)
Glucose-Capillary: 127 mg/dL — ABNORMAL HIGH (ref 70–99)

## 2020-01-17 LAB — FIBRINOGEN: Fibrinogen: 522 mg/dL — ABNORMAL HIGH (ref 210–475)

## 2020-01-17 LAB — COOXEMETRY PANEL
Carboxyhemoglobin: 1.9 % — ABNORMAL HIGH (ref 0.5–1.5)
Methemoglobin: 1.1 % (ref 0.0–1.5)
O2 Saturation: 98.9 %
Total hemoglobin: 12.8 g/dL (ref 12.0–16.0)

## 2020-01-17 LAB — POCT ACTIVATED CLOTTING TIME
Activated Clotting Time: 136 seconds
Activated Clotting Time: 191 seconds

## 2020-01-17 SURGERY — LOWER EXTREMITY ANGIOGRAPHY
Anesthesia: LOCAL | Laterality: Left

## 2020-01-17 MED ORDER — FENTANYL CITRATE (PF) 100 MCG/2ML IJ SOLN
INTRAMUSCULAR | Status: DC | PRN
  Administered 2020-01-17 (×2): 25 ug via INTRAVENOUS

## 2020-01-17 MED ORDER — SODIUM CHLORIDE 0.9 % IV SOLN: INTRAVENOUS | Status: DC

## 2020-01-17 MED ORDER — ACETAMINOPHEN 325 MG PO TABS
650.0000 mg | ORAL_TABLET | ORAL | Status: DC | PRN
  Filled 2020-01-17: qty 2

## 2020-01-17 MED ORDER — ONDANSETRON HCL 4 MG/2ML IJ SOLN: 4.0000 mg | Freq: Four times a day (QID) | INTRAMUSCULAR | Status: DC | PRN

## 2020-01-17 MED ORDER — SODIUM CHLORIDE 0.9% FLUSH: 3.0000 mL | Freq: Two times a day (BID) | INTRAVENOUS | Status: DC

## 2020-01-17 MED ORDER — MIDAZOLAM HCL 2 MG/2ML IJ SOLN
INTRAMUSCULAR | Status: AC
  Filled 2020-01-17: qty 2

## 2020-01-17 MED ORDER — LIDOCAINE HCL (PF) 1 % IJ SOLN
INTRAMUSCULAR | Status: AC
  Filled 2020-01-17: qty 30

## 2020-01-17 MED ORDER — FENTANYL CITRATE (PF) 100 MCG/2ML IJ SOLN
INTRAMUSCULAR | Status: AC
  Filled 2020-01-17: qty 2

## 2020-01-17 MED ORDER — IODIXANOL 320 MG/ML IV SOLN
INTRAVENOUS | Status: DC | PRN
  Administered 2020-01-17: 100 mL via INTRA_ARTERIAL

## 2020-01-17 MED ORDER — MIDAZOLAM HCL 2 MG/2ML IJ SOLN
INTRAMUSCULAR | Status: DC | PRN
  Administered 2020-01-17 (×2): 1 mg via INTRAVENOUS

## 2020-01-17 MED ORDER — HEPARIN (PORCINE) IN NACL 1000-0.9 UT/500ML-% IV SOLN
INTRAVENOUS | Status: AC
  Filled 2020-01-17: qty 1000

## 2020-01-17 MED ORDER — HYDRALAZINE HCL 20 MG/ML IJ SOLN: 5.0000 mg | INTRAMUSCULAR | Status: DC | PRN

## 2020-01-17 MED ORDER — HEPARIN SODIUM (PORCINE) 1000 UNIT/ML IJ SOLN
INTRAMUSCULAR | Status: AC
  Filled 2020-01-17: qty 1

## 2020-01-17 MED ORDER — LABETALOL HCL 5 MG/ML IV SOLN: 10.0000 mg | INTRAVENOUS | Status: DC | PRN

## 2020-01-17 MED ORDER — SODIUM CHLORIDE 0.9% FLUSH
3.0000 mL | Freq: Two times a day (BID) | INTRAVENOUS | Status: DC
  Administered 2020-01-17 – 2020-01-21 (×5): 3 mL via INTRAVENOUS

## 2020-01-17 MED ORDER — SODIUM CHLORIDE 0.9% FLUSH: 3.0000 mL | INTRAVENOUS | Status: DC | PRN

## 2020-01-17 MED ORDER — HEPARIN (PORCINE) IN NACL 1000-0.9 UT/500ML-% IV SOLN
INTRAVENOUS | Status: DC | PRN
  Administered 2020-01-17 (×2): 500 mL

## 2020-01-17 MED ORDER — SODIUM CHLORIDE 0.9 % IV SOLN: 250.0000 mL | INTRAVENOUS | Status: DC | PRN

## 2020-01-17 MED ORDER — HEPARIN SODIUM (PORCINE) 1000 UNIT/ML IJ SOLN
INTRAMUSCULAR | Status: DC | PRN
  Administered 2020-01-17: 4000 [IU] via INTRAVENOUS
  Administered 2020-01-17: 5000 [IU] via INTRAVENOUS
  Administered 2020-01-17: 6000 [IU] via INTRAVENOUS

## 2020-01-17 MED ORDER — HEPARIN (PORCINE) 25000 UT/250ML-% IV SOLN
1800.0000 [IU]/h | INTRAVENOUS | Status: DC
  Administered 2020-01-18: 1800 [IU]/h via INTRAVENOUS
  Administered 2020-01-18: 07:00:00 1700 [IU]/h via INTRAVENOUS
  Filled 2020-01-17 (×2): qty 250

## 2020-01-17 SURGICAL SUPPLY — 19 items
BALLN STERLING OTW 3X150X150 (BALLOONS) ×2
BALLN STERLING OTW 4X220X150 (BALLOONS) ×2
BALLN STERLING OTW 5X220X150 (BALLOONS) ×2
BALLOON STERLING OTW 3X150X150 (BALLOONS) IMPLANT
BALLOON STERLING OTW 4X220X150 (BALLOONS) IMPLANT
BALLOON STERLING OTW 5X220X150 (BALLOONS) IMPLANT
CANISTER PENUMBRA ENGINE (MISCELLANEOUS) ×1 IMPLANT
CATH INDIGO CAT6 KIT (CATHETERS) ×1 IMPLANT
CATH QUICKCROSS .035X135CM (MICROCATHETER) ×1 IMPLANT
CATH TEMPO AQUA 5F 100CM (CATHETERS) ×1 IMPLANT
CLOSURE MYNX CONTROL 6F/7F (Vascular Products) ×1 IMPLANT
KIT ENCORE 26 ADVANTAGE (KITS) ×1 IMPLANT
KIT PV (KITS) ×1 IMPLANT
SHEATH PINNACLE 7F 10CM (SHEATH) ×1 IMPLANT
SHEATH PINNACLE ST 7F 45CM (SHEATH) ×1 IMPLANT
SHEATH PROBE COVER 6X72 (BAG) ×1 IMPLANT
TRAY PV CATH (CUSTOM PROCEDURE TRAY) ×1 IMPLANT
WIRE G V18X300CM (WIRE) ×1 IMPLANT
WIRE ROSEN-J .035X260CM (WIRE) ×1 IMPLANT

## 2020-01-17 NOTE — Progress Notes (Signed)
Robert Sanchez has been quite irritable and rude this shift with the RN and toward the NT as well. He has refused to complete the CHG even after education. He also has been bending his right leg up where his Femoral arterial sheath is located. Mr. Maciolek was educated on the reasoning to keep his leg straight and why we are keeping his HOB no higher than 20 degrees. He listened for the moment once these precautions were explained to him, but then began to bend his leg again with the RN in the room.

## 2020-01-17 NOTE — Op Note (Signed)
Patient name: Robert Sanchez MRN: [id number] DOB: 10-Sep-1956 Sex: male  01/17/2020 Pre-operative Diagnosis: Critical limb ischemia of the left lower extremity with occluded common femoral to posterior tibial artery vein bypass Post-operative diagnosis:  Same Surgeon:  [name], MD Procedure Performed: 1.  Thrombolytics catheter check of the left lower extremity including left lower extremity arteriogram with selection of third order branches 2.  Left common femoral to posterior tibial artery vein bypass angioplasty (4 mm x 220 mm Sterling and 5 mm x 220 mm Sterling) 3.  Percutaneous mechanical thrombectomy of the left common femoral to posterior tibial artery bypass (CAT 6 penumbra percutaneous mechanical thrombectomy) 4.  Left posterior tibial artery angioplasty (3 mm x 150 mm Sterling) 5.  Mynx closure of the right common femoral artery 6.  78 minutes of monitored moderate conscious sedation time  Indications:    64 year old male who presented with occluded left common femoral to posterior tibial artery vein bypass after 3 days of worsening left foot pain and cellulitis.  He underwent thrombolytics catheter placement in the bypass yesterday and has been undergoing thrombolysis in the ICU with alteplase.  He presents today after risk benefits were discussed.  Findings:   Thrombolytics catheter check showed diminutive flow down the left common femoral to posterior tibial artery bypass although it was now patent with flow into the ankle at the posterior tibial.  Initially performed balloon angioplasty of the entire bypass initially with a 4 mm x 220 mm Sterling and then a 5 mm x 220 mm Sterling.  On repeat injection, I could not identify any flow down the bypass and all flow was down the profunda.  Ultimately we elected to perform percutaneous mechanical thrombectomy with penumbra cat 6 catheter and retrieved several large clots proximally.  I then had reestablished flow down the  bypass with inline flow into the ankle at the posterior tibial.  There was a diseased posterior tibial proximally we treated with a 3 mm Sterling.  He has a very brisk posterior tibial signal at the ankle.  All catheters were removed and mynx closure was used to close the right common femoral artery.  He will be restarted on heparin immediately.   Procedure:  The patient was identified in the holding area and taken to room 8.  The patient was then placed supine on the table and prepped and draped in the usual sterile fashion.  A time out was called.  Initially confirmed that the thrombolytics catheter remained in the left lower extremity bypass and we removed the inner cannula and placed a long V 18 wire down distal into the calf in the distal bypass.  The Unifuse catheter was subsequently removed.  Ultimately we then performed hand injections of the left lower extremity bypass and this showed now evidence of flow down the bypass although it looked very diminutive.  Subsequently elected to perform balloon angioplasty and started with a 4 mm x 220 mm Sterling and angioplastied the entire length of the bypass from the common femoral anastomosis all the way down to the PT.  There was a significant a waste in the balloon proximally and my impression was there was some sclerotic narrowed segment of the bypass in the proximal segment whereas this bypass was more robust distally.  We gave initially 5000 units of IV heparin at the start of the case and then we checked ACT and were subtherapeutic and gave an additional 5000 units and then 3000 units to get therapeutic.  Upon another injection we had no flow down the bypass after balloon angioplasty.  I then elected to upsized to a 5 mm x 220 m Sterling and performed balloon angioplasty from the proximal anastomosis all the way down to the knee joint where there appeared to be a narrowing in the bypass based on waste in the balloon from our initial angioplasty with a 4 mm  balloon.  Again more distal it looked robust and healthy.  Again I had no flow down the bypass and given a subtherapeutic ACT initially, I was suspicious that there was thrombus again in the bypass graft or remnant thrombus.  I elected to perform percutaneous mechanical thrombectomy we then put a long Rosen wire down the bypass and exchanged for a 7 [place] destination sheath placed over the aortic bifurcation.  We then used a long 0.035 exchange catheter and put our V 18 wire back down.  I then performed percutaneous mechanical thrombectomy of the bypass throughout the entire length with a CAT 6 catheter.  We retrieve several large lots from the proximal bypass.  After that we then had persistent flow in the aspiration catheter throughout the entire length of the bypass suggesting we had reestablished flow.  I then shot another picture thought the sheath and this looked much better with inline flow down the bypass after percutaneous mechanical thrombectomy.  I then reevaluated and the only other residual stenosis I saw was in the PT distal to the anastomosis we selected a 3 mm Sterling and perform balloon angioplasty to nominal pressure for 2 minutes of the PT and distal anastomosis for 2 minutes at nominal pressure.  Once we were satisfied with the results and had excellent inline flow into the foot wires and catheters were removed.  There was brisk flow into the foot.  We used a J-wire and exchanged for a short 7 French sheath in the right groin.  Ultimately mynx closure was deployed in the right groin.  Pressure was held.  Plan: Patient will be immediately restarted on heparin without bolus.  He has reeestablished flow down the left lower extremity bypass with a brisk posterior tibial signal.    Cephus Shelling, MD [facility] Office: [phone]

## 2020-01-17 NOTE — Progress Notes (Signed)
Pt to cath lab at this time. 0915

## 2020-01-17 NOTE — Progress Notes (Signed)
ANTICOAGULATION CONSULT NOTE - Follow Up Consult  Pharmacy Consult for Heparin Indication: ischemic leg  Labs: Recent Labs    01/15/20 0750 01/15/20 0750 01/16/20 0244 01/16/20 0244 01/16/20 1920 01/16/20 1920 01/16/20 2229 01/17/20 0642 01/17/20 1808  HGB 13.4   < > 13.1   < > 13.3   < > 12.7* 12.2*  --   HCT 41.7   < > 40.2   < > 41.1  --  38.8* 37.1*  --   PLT 314   < > 278   < > 287  --  273 271  --   LABPROT  --   --  15.1  --   --   --   --   --   --   INR  --   --  1.2  --   --   --   --   --   --   HEPARINUNFRC  --   --  1.09*   < > 0.12*   < > 0.12* 0.12* 0.43  CREATININE 1.32*  --  1.20  --   --   --   --  1.38*  --    < > = values in this interval not displayed.    Assessment: 64 yr old male S/P alteplase/heparin for critical limb ischemia of LLE with occluded common femoral to posterior tibial artery vein bypass; pt is S/P vascular surgery this afternoon.   Post procedure, heparin was resumed at 1450 units/hr. Heparin level ~7 hrs after resuming heparin infusion was 0.43 units/ml, which is within the goal range for this pt. CBC stable. Per RN, no issues with IV or bleeding observed.   Goal of Therapy:  Heparin level: 0.3-0.7 units/ml   Plan:  Continue heparin infusion at 1450 units/hr Check confirmatory heparin level in 6 hours Monitor daily heparin level, CBC Monitor for signs/symptoms of bleeding   Vicki Mallet, PharmD, BCPS, [facility] Clinical Pharmacist 01/17/2020 7:15 PM

## 2020-01-17 NOTE — Progress Notes (Signed)
[facility]  Subjective  - Cellulitis in left foot improving.     Objective (!) 123/59 77 98.4 F (36.9 C) (Oral) 18 97%  Intake/Output Summary (Last 24 hours) at 01/17/2020 0928 Last data filed at 01/17/2020 0900 Gross per 24 hour  Intake 2058.94 ml  Output 1450 ml  Net 608.94 ml    Right groin c/d/i Left PT signal Left foot warm  Laboratory Lab Results: Recent Labs    01/16/20 2229 01/17/20 0642  WBC 10.7* 10.2  HGB 12.7* 12.2*  HCT 38.8* 37.1*  PLT 273 271   BMET Recent Labs    01/16/20 0244 01/17/20 0642  NA 136 138  K 4.3 4.3  CL 101 104  CO2 20* 22  GLUCOSE 174* 165*  BUN 26* 20  CREATININE 1.20 1.38*  CALCIUM 9.0 8.8*    COAG Lab Results  Component Value Date   INR 1.2 01/16/2020   INR 1.19 08/17/2018   INR 1.45 06/12/2018   No results found for: PTT  Assessment/Planning:  64 year old male with occluded left common femoral to PT bypass with vein.  Underwent initiation of thrombolysis overnight.  Cellulitis in the left foot looks a little better today.  Plan for thrombolysis catheter check.  Cephus Shelling 01/17/2020 9:28 AM --

## 2020-01-17 NOTE — Progress Notes (Signed)
ANTICOAGULATION CONSULT NOTE - Follow Up Consult  Pharmacy Consult for heparin + alteplase Indication: ischemic leg  Labs: Recent Labs    01/15/20 0750 01/15/20 0750 01/16/20 0244 01/16/20 0244 01/16/20 1920 01/16/20 1920 01/16/20 2229 01/17/20 0642  HGB 13.4   < > 13.1   < > 13.3   < > 12.7* 12.2*  HCT 41.7   < > 40.2   < > 41.1  --  38.8* 37.1*  PLT 314   < > 278   < > 287  --  273 271  LABPROT  --   --  15.1  --   --   --   --   --   INR  --   --  1.2  --   --   --   --   --   HEPARINUNFRC  --   --  1.09*   < > 0.12*  --  0.12* 0.12*  CREATININE 1.32*  --  1.20  --   --   --   --  1.38*   < > = values in this interval not displayed.    Assessment: 63yo male subtherapeutic on heparin drip 1200 uts/hr Heparin level 0.12 after rate change.  Fibrinogen still 500s h/h stable PLTC 200s  no gtt issues or signs of bleeding per RN.  Goal of Therapy:  Heparin level 0.2-0.5 units/ml   Plan:  increase heparin gtt  to 1450 units/hr and check level with next lab draw.     Leota Sauers Pharm.D. CPP, BCPS Clinical Pharmacist [phone] 01/17/2020 8:18 AM

## 2020-01-17 NOTE — Progress Notes (Signed)
[facility]  PROGRESS NOTE  Robert Sanchez YQM:[id number] DOB: [birth year] DOA: 01/15/2020 PCP: Doristine Bosworth, MD   Brief HPI:   64 year old male with history of CAD s/p CABG, PVD, s/p hypertension, hyperlipidemia, COPD, diabetes mellitus type 2 with neuropathy, CKD stage III, GERD presented with complaints of left foot pain and redness over the past 2 to 3 days.  He previously had left femoral to posterior tibial artery grafting performed in October 2019 by Dr. Chestine Spore of vascular surgery.  Following bypass surgery he reported that his foot was getting good blood flow but he was never able to move the lateral 3 toes.  X-ray of the foot in the ED showed subcutaneous swelling, patient was started on empiric antibiotics, Zosyn.  Vascular surgery was consulted.    Subjective   Patient seen and examined, currently in ICU for IV thrombolytics, as per vascular surgery.  He denies any pain.  Feels better.   Assessment/Plan:     1. Cellulitis of left foot-patient presented with redness and increased warmth of left foot.  No clear signs of injury noted.  X-ray imaging showed diffuse soft tissue edema concerning for cellulitis.  Patient was started on vancomycin and Zosyn. 2. Peripheral arterial disease/critical limb ischemia-patient has history of left femoral to posterior tibial bypass with reverse saphenous vein on 10/19.  Vascular surgery was consulted.  Vascular ultrasound with ABI showed severe left lower extremity arterial disease, patient underwent aortogram, left lower extremity arteriogram with placement of thrombolytic catheter in the left lower extremity bypass with initiation of thrombolysis.  Patient is currently receiving IV alteplase, IV heparin per vascular surgery. 3. Diabetes mellitus type 2-patient is glipizide and Metformin at home.  Oral medications are currently on hold.  Continue sliding scale insulin with NovoLog.  CBG well controlled.   4. CKD stage III-patient's  creatinine is 1.38 today.  Slightly up from yesterday 1.20.  Follow BMP in a.m. 5. Acute on chronic systolic CHF exacerbation-chest x-ray was concerning for cardiomegaly with mild interstitial edema.  He received 1 dose of Lasix 40 mg IV.  At this time patient denies shortness of breath, O2 sats 97% on room air.  We will continue to monitor.    SpO2: 99 % O2 Flow Rate (L/min): 2 L/min   COVID-19 Labs  No results for input(s): DDIMER, FERRITIN, LDH, CRP in the last 72 hours.  Lab Results  Component Value Date   SARSCOV2NAA NEGATIVE 01/15/2020   SARSCOV2NAA NEGATIVE 05/04/2019   SARSCOV2NAA Not Detected 05/01/2019     CBG: Recent Labs  Lab 01/16/20 1043 01/16/20 1630 01/16/20 2151 01/17/20 0640 01/17/20 1121  GLUCAP 139* 198* 205* 160* 152*    CBC: Recent Labs  Lab 01/15/20 0750 01/16/20 0244 01/16/20 1920 01/16/20 2229 01/17/20 0642  WBC 12.5* 11.7* 11.2* 10.7* 10.2  NEUTROABS 9.4*  --   --   --   --   HGB 13.4 13.1 13.3 12.7* 12.2*  HCT 41.7 40.2 41.1 38.8* 37.1*  MCV 92.3 92.0 92.6 92.2 91.6  PLT 314 278 287 273 271    Basic Metabolic Panel: Recent Labs  Lab 01/15/20 0750 01/16/20 0244 01/17/20 0642  NA 139 136 138  K 4.5 4.3 4.3  CL 105 101 104  CO2 22 20* 22  GLUCOSE 234* 174* 165*  BUN 23 26* 20  CREATININE 1.32* 1.20 1.38*  CALCIUM 9.3 9.0 8.8*     Liver Function Tests: Recent Labs  Lab 01/15/20 0750  AST 13*  ALT 16  ALKPHOS 68  BILITOT 0.8  PROT 7.9  ALBUMIN 3.7        DVT prophylaxis: Heparin  Code Status: Full code  Family Communication: No family at bedside  Disposition Plan: Patient is on IV heparin, thrombolytics for critical limb ischemia as per vascular surgery.         Scheduled medications:  . aspirin  325 mg Oral QODAY  . atorvastatin  40 mg Oral QHS  . carvedilol  6.25 mg Oral BID  . Chlorhexidine Gluconate Cloth  6 each Topical Daily  . Chlorhexidine Gluconate Cloth  6 each Topical Q0600  . insulin  aspart  0-15 Units Subcutaneous TID WC  . insulin aspart  0-5 Units Subcutaneous QHS  . mupirocin ointment  1 application Nasal BID  . sodium chloride flush  3 mL Intravenous Q12H    Consultants:  Vascular surgery  Procedures:    Antibiotics:   Anti-infectives (From admission, onward)   Start     Dose/Rate Route Frequency Ordered Stop   01/17/20 1200  vancomycin (VANCOREADY) IVPB 1250 mg/250 mL     1,250 mg 166.7 mL/hr over 90 Minutes Intravenous Every 24 hours 01/16/20 1834     01/16/20 2200  piperacillin-tazobactam (ZOSYN) IVPB 3.375 g     3.375 g 12.5 mL/hr over 240 Minutes Intravenous Every 8 hours 01/16/20 1834     01/16/20 1300  vancomycin (VANCOREADY) IVPB 1250 mg/250 mL  Status:  Discontinued     1,250 mg 166.7 mL/hr over 90 Minutes Intravenous Every 24 hours 01/15/20 1212 01/16/20 1834   01/15/20 1700  piperacillin-tazobactam (ZOSYN) IVPB 3.375 g  Status:  Discontinued     3.375 g 12.5 mL/hr over 240 Minutes Intravenous Every 8 hours 01/15/20 1212 01/16/20 1834   01/15/20 1215  vancomycin (VANCOCIN) IVPB 1000 mg/200 mL premix  Status:  Discontinued     1,000 mg 200 mL/hr over 60 Minutes Intravenous  Once 01/15/20 1202 01/15/20 1208   01/15/20 1215  vancomycin (VANCOREADY) IVPB 2000 mg/400 mL     2,000 mg 200 mL/hr over 120 Minutes Intravenous  Once 01/15/20 1208 01/15/20 1823   01/15/20 0915  piperacillin-tazobactam (ZOSYN) IVPB 3.375 g     3.375 g 100 mL/hr over 30 Minutes Intravenous  Once 01/15/20 0905 01/15/20 0952       Objective   Vitals:   01/17/20 1405 01/17/20 1420 01/17/20 1435 01/17/20 1504  BP: 138/60 122/62 140/71 127/66  Pulse: 75 77 84 83  Resp: 13 19 17 15   Temp:    98 F (36.7 C)  TempSrc:    Oral  SpO2: 100% 97% 99% 99%  Weight:      Height:        Intake/Output Summary (Last 24 hours) at 01/17/2020 1640 Last data filed at 01/17/2020 0900 Gross per 24 hour  Intake 2058.94 ml  Output 1050 ml  Net 1008.94 ml    03/16 1901 -  03/18 0700 In: 1928.7 [P.O.:240; I.V.:1620.3] Out: 3225 [Urine:3225]  Filed Weights   01/15/20 0743 01/16/20 0051 01/17/20 0500  Weight: 97.5 kg 97.8 kg 98 kg    Physical Examination:   General-appears in no acute distress Heart-S1-S2, regular, no murmur auscultated Lungs-clear to auscultation bilaterally, no wheezing or crackles auscultated Abdomen-soft, nontender, no organomegaly Extremities-left foot erythematous, mild tender to palpation, with black discoloration of first digit Neuro-alert, oriented x3, no focal deficit noted   Data Reviewed:   Recent Results (from the past 240 hour(s))  SARS CORONAVIRUS 2 (TAT 6-24 HRS)  Nasopharyngeal Nasopharyngeal Swab     Status: None   Collection Time: 01/15/20  9:44 AM   Specimen: Nasopharyngeal Swab  Result Value Ref Range Status   SARS Coronavirus 2 NEGATIVE NEGATIVE Final    Comment: (NOTE) SARS-CoV-2 target nucleic acids are NOT DETECTED. The SARS-CoV-2 RNA is generally detectable in upper and lower respiratory specimens during the acute phase of infection. Negative results do not preclude SARS-CoV-2 infection, do not rule out co-infections with other pathogens, and should not be used as the sole basis for treatment or other patient management decisions. Negative results must be combined with clinical observations, patient history, and epidemiological information. The expected result is Negative. Fact Sheet for Patients: SugarRoll.be Fact Sheet for Healthcare Providers: https://www.woods-mathews.com/ This test is not yet approved or cleared by the [place] FDA and  has been authorized for detection and/or diagnosis of SARS-CoV-2 by FDA under an Emergency Use Authorization (EUA). This EUA will remain  in effect (meaning this test can be used) for the duration of the COVID-19 declaration under Section 56 4(b)(1) of the Act, 21 U.S.C. section 360bbb-3(b)(1), unless the  authorization is terminated or revoked sooner. Performed at [facility], [place] [address], [place], [place] [address]   MRSA PCR Screening     Status: None   Collection Time: 01/16/20  6:30 PM   Specimen: Nasal Mucosa; Nasopharyngeal  Result Value Ref Range Status   MRSA by PCR NEGATIVE NEGATIVE Final    Comment:        The GeneXpert MRSA Assay (FDA approved for NASAL specimens only), is one component of a comprehensive MRSA colonization surveillance program. It is not intended to diagnose MRSA infection nor to guide or monitor treatment for MRSA infections. Performed at [facility], [place] [address], [place], [place] [address]     No results for input(s): LIPASE, AMYLASE in the last 168 hours. No results for input(s): AMMONIA in the last 168 hours.  Cardiac Enzymes: No results for input(s): CKTOTAL, CKMB, CKMBINDEX, TROPONINI in the last 168 hours. BNP (last 3 results) Recent Labs    05/04/19 1658 01/15/20 2228  BNP 1,562.1* 244.9*    ProBNP (last 3 results) No results for input(s): PROBNP in the last 8760 hours.  Studies:  PERIPHERAL VASCULAR CATHETERIZATION  Result Date: 01/17/2020 Patient name: Robert Sanchez     MRN: [id number]        DOB: [birth year]          Sex: male  01/17/2020 Pre-operative Diagnosis: Critical limb ischemia of the left lower extremity with occluded common femoral to posterior tibial artery vein bypass Post-operative diagnosis:  Same Surgeon:  [name], MD Procedure Performed: 1.  Thrombolytics catheter check of the left lower extremity including left lower extremity arteriogram with selection of third order branches 2.  Left common femoral to posterior tibial artery vein bypass angioplasty (4 mm x 220 mm Sterling and 5 mm x 220 mm Sterling) 3.  Percutaneous mechanical thrombectomy of the left common femoral to posterior tibial artery bypass (CAT 6 penumbra percutaneous mechanical thrombectomy) 4.  Left posterior tibial artery  angioplasty (3 mm x 150 mm Sterling) 5.  Mynx closure of the right common femoral artery 6.  78 minutes of monitored moderate conscious sedation time  Indications:   64 year old male who presented with occluded left common femoral to posterior tibial artery vein bypass after 3 days of worsening left foot pain and cellulitis.  He underwent thrombolytics catheter placement in the bypass  yesterday and has been undergoing thrombolysis in the ICU with alteplase.  He presents today after risk benefits were discussed.  Findings:  Thrombolytics catheter check showed diminutive flow down the left common femoral to posterior tibial artery bypass although it was now patent with flow into the ankle at the posterior tibial.  Initially performed balloon angioplasty of the entire bypass initially with a 4 mm x 220 mm Sterling and then a 5 mm x 220 mm Sterling.  On repeat injection, I could not identify any flow down the bypass and all flow was down the profunda.  Ultimately we elected to perform percutaneous mechanical thrombectomy with penumbra cat 6 catheter and retrieved several large clots proximally.  I then had reestablished flow down the bypass with inline flow into the ankle at the posterior tibial.  There was a diseased posterior tibial proximally we treated with a 3 mm Sterling.  He has a very brisk posterior tibial signal at the ankle.  All catheters were removed and mynx closure was used to close the right common femoral artery.  He will be restarted on heparin immediately.             Procedure:  The patient was identified in the holding area and taken to room 8.  The patient was then placed supine on the table and prepped and draped in the usual sterile fashion.  A time out was called.  Initially confirmed that the thrombolytics catheter remained in the left lower extremity bypass and we removed the inner cannula and placed a long V 18 wire down distal into the calf in the distal bypass.  The Unifuse catheter was  subsequently removed.  Ultimately we then performed hand injections of the left lower extremity bypass and this showed now evidence of flow down the bypass although it looked very diminutive.  Subsequently elected to perform balloon angioplasty and started with a 4 mm x 220 mm Sterling and angioplastied the entire length of the bypass from the common femoral anastomosis all the way down to the PT.  There was a significant a waste in the balloon proximally and my impression was there was some sclerotic narrowed segment of the bypass in the proximal segment whereas this bypass was more robust distally.  We gave initially 5000 units of IV heparin at the start of the case and then we checked ACT and were subtherapeutic and gave an additional 5000 units and then 3000 units to get therapeutic.  Upon another injection we had no flow down the bypass after balloon angioplasty.  I then elected to upsized to a 5 mm x 220 m Sterling and performed balloon angioplasty from the proximal anastomosis all the way down to the knee joint where there appeared to be a narrowing in the bypass based on waste in the balloon from our initial angioplasty with a 4 mm balloon.  Again more distal it looked robust and healthy.  Again I had no flow down the bypass and given a subtherapeutic ACT initially, I was suspicious that there was thrombus again in the bypass graft or remnant thrombus.  I elected to perform percutaneous mechanical thrombectomy we then put a long Rosen wire down the bypass and exchanged for a 7 [place] destination sheath placed over the aortic bifurcation.  We then used a long 0.035 exchange catheter and put our V 18 wire back down.  I then performed percutaneous mechanical thrombectomy of the bypass throughout the entire length with a CAT 6 catheter.  We  retrieve several large lots from the proximal bypass.  After that we then had persistent flow in the aspiration catheter throughout the entire length of the bypass suggesting  we had reestablished flow.  I then shot another picture thought the sheath and this looked much better with inline flow down the bypass after percutaneous mechanical thrombectomy.  I then reevaluated and the only other residual stenosis I saw was in the PT distal to the anastomosis we selected a 3 mm Sterling and perform balloon angioplasty to nominal pressure for 2 minutes of the PT and distal anastomosis for 2 minutes at nominal pressure.  Once we were satisfied with the results and had excellent inline flow into the foot wires and catheters were removed.  There was brisk flow into the foot.  We used a J-wire and exchanged for a short 7 French sheath in the right groin.  Ultimately mynx closure was deployed in the right groin.  Pressure was held.  Plan: Patient will be immediately restarted on heparin without bolus.  He has reeestablished flow down the left lower extremity bypass with a brisk posterior tibial signal.    Cephus Shelling, MD [facility] Office: [phone]   PERIPHERAL VASCULAR CATHETERIZATION  Result Date: 01/16/2020 Patient name: Robert Sanchez     MRN: [id number]        DOB: 05-Jul-1956          Sex: male  01/16/2020 Pre-operative Diagnosis: Critical limb ischemia of the left lower extremity with occluded common femoral to posterior tibial artery vein bypass Post-operative diagnosis:  Same Surgeon:  Cephus Shelling, MD Procedure Performed: 1.  Ultrasound-guided access of the right common femoral artery 2.  Aortogram 3.  Left lower extremity arteriogram with selection of third order branches 4.  Placement of thrombolytic's catheter in left lower extremity bypass with initiation of thrombolysis 5.  61 minutes of monitored moderate conscious sedation time  Indications: Patient is a 64 year old male who presented to the ED yesterday with 3 days of worsening left foot pain and cellulitis.  He has a known left common femoral artery to posterior tibial  artery bypass with saphenous vein that was placed in 2019.  He has not been seen in follow-up and he missed all of his surveillance appointments.  He was found to have an occluded bypass and presents today for attempted thrombolysis after risks and benefits were discussed.  Findings:  Aortogram showed that his left external iliac artery stent only has about a 20% stenosis with no overt flow limitation.  There is a calcified plaque in the proximal left external iliac artery.  No other hemodynamically significant aortoiliac lesions.  The left common femoral and profunda remained patent on the left.  There is a short stump of SFA and then the SFA is occluded for long segment.  The left common femoral to PT bypass is occluded throughout its course and a small stump of bypass was visualized off the common femoral artery.  He does reconstitute a distal SFA and above-knee popliteal artery that is heavily diseased and has a patent below-knee popliteal artery but also looks heavily diseased.  Dominant runoff in the left lower extremities via the peroneal and posterior tibial artery with a high-grade stenosis in the proximal AT.  Ultimately the bypass in the left lower extremity was selected and placed a long 136 cm Unifuse catheter with 50 cm infusion length into the bypass and will initiate thrombolysis overnight.  Procedure:  The patient was identified in the holding area and taken to room 8.  The patient was then placed supine on the table and prepped and draped in the usual sterile fashion.  A time out was called.  Ultrasound was used to evaluate the right common femoral artery.  It was patent .  A digital ultrasound image was acquired.  A micropuncture needle was used to access the right common femoral artery under ultrasound guidance.  An 018 wire was advanced without resistance and a micropuncture sheath was placed.  The 018 wire was removed and a J wire was placed.  The micropuncture sheath was  exchanged for a 5 french sheath.  An omniflush catheter was advanced over the wire to the level of L-1.  An abdominal angiogram was obtained.  Next the Omni Flush catheter was used to select the left iliac and the catheter was advanced to the distal left external iliac.  Left lower extremity runoff was then obtained with pertinent findings noted above.  Ultimately the left common femoral to posterior tibial artery vein bypass is occluded throughout its course.  Subsequently exchanged for a KMP catheter with a Glidewire advantage in order to identify the takeoff of the bypass and we were finally able to get a wire into and cannulate the bypass and got a wire all the way down into the distal calf of the bypass.  That point in time patient was given 8000 units of IV heparin.  I then exchanged for a 6 [place] long Ansell sheath in the right groin over the aortic bifurcation into the left common femoral.  Over this we then tried to advance a UniFuse catheter but had some trouble advancing the Unifuse catheter and our sheath kept kicking up into the aorta.  I then exchanged with a long 100 cm flush catheter for an Amplatz wire for more support down the bypass.  Finally able to place a long 135 cm UniFuse catheter with 50 cm infusion length into the bypass.  Alteplase will run at 1 mg/hr through bypass and heparin at 800 units/hr through sheath.  Will admit to ICU for thrombolysis overnight.  The right groin sheath was secured.  Pain: Patient will return tomorrow for thrombolysis catheter check.  Cephus Shelling, MD [facility] Office: [phone]     Admission status: The appropriate admission status for this patient is INPATIENT. Inpatient status is judged to be reasonable and necessary in order to provide the required intensity of service to ensure the patient's safety. The patient's presenting symptoms, physical exam findings, and initial radiographic and laboratory data in  the context of their chronic comorbidities is felt to place them at high risk for further clinical deterioration. Furthermore, it is not anticipated that the patient will be medically stable for discharge from the hospital within 2 midnights of admission. The following factors support the admission status of inpatient.    The patient's presenting symptoms include limb pain The worrisome physical exam findings include critical limb ischemia, cellulitis The initial radiographic and laboratory data are worrisome because of critical limb ischemia seen on ABI vascular ultrasound The chronic co-morbidities include peripheral vascular disease    * I certify that at the point of admission it is my clinical judgment that the patient will require inpatient hospital care spanning beyond 2 midnights from the point of admission due to high intensity of service, high risk for further deterioration and high frequency of surveillance required.Sibyl Parr  S Mario Voong   [facility] If 7PM-7AM, please contact night-coverage at [email or web address], Office  [phone]   01/17/2020, 4:40 PM  LOS: 2 days

## 2020-01-18 LAB — CBC
HCT: 35.8 % — ABNORMAL LOW (ref 39.0–52.0)
Hemoglobin: 11.6 g/dL — ABNORMAL LOW (ref 13.0–17.0)
MCH: 30.1 pg (ref 26.0–34.0)
MCHC: 32.4 g/dL (ref 30.0–36.0)
MCV: 92.7 fL (ref 80.0–100.0)
Platelets: 247 10*3/uL (ref 150–400)
RBC: 3.86 MIL/uL — ABNORMAL LOW (ref 4.22–5.81)
RDW: 13.6 % (ref 11.5–15.5)
WBC: 9.6 10*3/uL (ref 4.0–10.5)
nRBC: 0 % (ref 0.0–0.2)

## 2020-01-18 LAB — COMPREHENSIVE METABOLIC PANEL
ALT: 11 U/L (ref 0–44)
AST: 11 U/L — ABNORMAL LOW (ref 15–41)
Albumin: 3 g/dL — ABNORMAL LOW (ref 3.5–5.0)
Alkaline Phosphatase: 44 U/L (ref 38–126)
Anion gap: 11 (ref 5–15)
BUN: 16 mg/dL (ref 8–23)
CO2: 21 mmol/L — ABNORMAL LOW (ref 22–32)
Calcium: 8.8 mg/dL — ABNORMAL LOW (ref 8.9–10.3)
Chloride: 106 mmol/L (ref 98–111)
Creatinine, Ser: 1.23 mg/dL (ref 0.61–1.24)
GFR calc Af Amer: >60 mL/min (ref >60)
GFR calc non Af Amer: >60 mL/min (ref >60)
Glucose, Bld: 165 mg/dL — ABNORMAL HIGH (ref 70–99)
Potassium: 4 mmol/L (ref 3.5–5.1)
Sodium: 138 mmol/L (ref 135–145)
Total Bilirubin: 1.2 mg/dL (ref 0.3–1.2)
Total Protein: 6.8 g/dL (ref 6.5–8.1)

## 2020-01-18 LAB — GLUCOSE, CAPILLARY
Glucose-Capillary: 147 mg/dL — ABNORMAL HIGH (ref 70–99)
Glucose-Capillary: 216 mg/dL — ABNORMAL HIGH (ref 70–99)
Glucose-Capillary: 154 mg/dL — ABNORMAL HIGH (ref 70–99)
Glucose-Capillary: 205 mg/dL — ABNORMAL HIGH (ref 70–99)

## 2020-01-18 LAB — HEPARIN LEVEL (UNFRACTIONATED)
Heparin Unfractionated: 0.23 IU/mL — ABNORMAL LOW (ref 0.30–0.70)
Heparin Unfractionated: 0.29 IU/mL — ABNORMAL LOW (ref 0.30–0.70)
Heparin Unfractionated: 0.34 IU/mL (ref 0.30–0.70)

## 2020-01-18 MED ORDER — OXYCODONE-ACETAMINOPHEN 5-325 MG PO TABS
1.0000 | ORAL_TABLET | ORAL | Status: DC | PRN
  Administered 2020-01-18 – 2020-01-20 (×6): 1 via ORAL
  Filled 2020-01-18 (×6): qty 1

## 2020-01-18 MED FILL — Lidocaine HCl Local Preservative Free (PF) Inj 1%: INTRAMUSCULAR | Qty: 30 | Status: AC

## 2020-01-18 NOTE — Progress Notes (Addendum)
Progress Note    01/18/2020 7:22 AM 1 Day Post-Op  Subjective:  Improved pain in left foot. States last night he could "feel the blood pulsing" in his left foot. Has been up ambulating in the room   Vitals:   01/17/20 2317 01/18/20 0315  BP: 123/67 120/63  Pulse: 82 76  Resp: 12 15  Temp: 98.2 F (36.8 C) 98.4 F (36.9 C)  SpO2: 98% 96%   Physical Exam: General: well appearing, comfortable Lungs:  Non labored Extremities:  Right femoral access site, clean, dry, without hematoma. Mild ecchymosis present. Bilateral lower extremities well perfused. Left foot warm. Left 1st toe cyanosis. Left foot slightly erythematous and edematous. Brisk dopper PT/pero. Faint DP doppler signal. Right Dopper DP/PT/ pero. Right foot warm. Motor and sensation intact bilaterally Neurologic: Alert and oriented  CBC    Component Value Date/Time   WBC 9.6 01/18/2020 0300   RBC 3.86 (L) 01/18/2020 0300   HGB 11.6 (L) 01/18/2020 0300   HCT 35.8 (L) 01/18/2020 0300   PLT 247 01/18/2020 0300   MCV 92.7 01/18/2020 0300   MCH 30.1 01/18/2020 0300   MCHC 32.4 01/18/2020 0300   RDW 13.6 01/18/2020 0300   LYMPHSABS 1.5 01/15/2020 0750   MONOABS 1.2 (H) 01/15/2020 0750   EOSABS 0.2 01/15/2020 0750   BASOSABS 0.1 01/15/2020 0750    BMET    Component Value Date/Time   NA 138 01/18/2020 0300   NA 136 09/14/2019 1701   K 4.0 01/18/2020 0300   CL 106 01/18/2020 0300   CO2 21 (L) 01/18/2020 0300   GLUCOSE 165 (H) 01/18/2020 0300   BUN 16 01/18/2020 0300   BUN 20 09/14/2019 1701   CREATININE 1.23 01/18/2020 0300   CREATININE 0.81 08/14/2014 1126   CALCIUM 8.8 (L) 01/18/2020 0300   GFRNONAA >60 01/18/2020 0300   GFRNONAA >89 08/14/2014 1126   GFRAA >60 01/18/2020 0300   GFRAA >89 08/14/2014 1126    INR    Component Value Date/Time   INR 1.2 01/16/2020 0244     Intake/Output Summary (Last 24 hours) at 01/18/2020 0722 Last data filed at 01/18/2020 0400 Gross per 24 hour  Intake 1870.01 ml   Output 650 ml  Net 1220.01 ml     Assessment/Plan:  64 y.o. male is s/p 1.  Thrombolytics catheter check of the left lower extremity including left lower extremity arteriogram with selection of third order branches 2.  Left common femoral to posterior tibial artery vein bypass angioplasty (4 mm x 220 mm Sterling and 5 mm x 220 mm Sterling) 3.  Percutaneous mechanical thrombectomy of the left common femoral to posterior tibial artery bypass (CAT 6 penumbra percutaneous mechanical thrombectomy) 4.  Left posterior tibial artery angioplasty (3 mm x 150 mm Sterling) 5.  Mynx closure of the right common femoral artery  1 Day Post-Op for cellulitis of left foot and occluded femoral to posterior tibial vein bypass. Cellulitis of left foot is improving. Improved pain control. Brisk PT/ Pero signals in left foot. Continued mobilization today. Cr was a little up yesterday but now at 1.23. Otherwise hemodynamically stable. Will discuss with Dr. Chestine Spore about resuming patients Plavix  Graceann Congress, PA-C [facility] [phone] 01/18/2020 7:22 AM   I have seen and evaluated the patient. I agree with the PA note as documented above.  64 year old male now postop day 2 status post initiation of thrombolysis and postop day 1 status post thrombolysis catheter check for an occluded left lower extremity  bypass.  We were able to get his bypass open yesterday with assistance of mechanical thrombectomy after thrombolysis.  Continues to have a very brisk posterior tibial signal and a good signal in the bypass.  Would continue heparin for now and ultimately will need to be transitioned to oral anticoagulation at discharge given high risk for bypass failure in the future.  I would hold off on this until he gets a few more days of antibiotics but the foot is looking much better.  Discussed that we will see how his great toe demarcates and may ultimately require great toe amputation.  Vascular will continue  to follow.  [name], MD [facility] [place] Office: [phone]

## 2020-01-18 NOTE — Progress Notes (Signed)
ANTICOAGULATION CONSULT NOTE - Follow Up Consult  Pharmacy Consult for Heparin Indication: ischemic leg  Labs: Recent Labs    01/16/20 0244 01/16/20 1920 01/16/20 2229 01/16/20 2229 01/17/20 0642 01/17/20 1808 01/18/20 0300 01/18/20 1025 01/18/20 1735  HGB 13.1   < > 12.7*   < > 12.2*  --  11.6*  --   --   HCT 40.2   < > 38.8*  --  37.1*  --  35.8*  --   --   PLT 278   < > 273  --  271  --  247  --   --   LABPROT 15.1  --   --   --   --   --   --   --   --   INR 1.2  --   --   --   --   --   --   --   --   HEPARINUNFRC 1.09*   < > 0.12*   < > 0.12*   < > 0.23* 0.29* 0.34  CREATININE 1.20  --   --   --  1.38*  --  1.23  --   --    < > = values in this interval not displayed.   . sodium chloride Stopped (01/16/20 0817)  . sodium chloride 10 mL/hr at 01/16/20 1038  . sodium chloride 100 mL/hr at 01/17/20 0900  . sodium chloride    . heparin 1,800 Units/hr (01/18/20 1243)  . piperacillin-tazobactam (ZOSYN)  IV 3.375 g (01/18/20 1733)  . vancomycin 1,250 mg (01/18/20 1249)     Assessment: 64 yr old male, S/P thrombolysis, mechanical thrombectomy, on IV heparin.    Heparin level ~5 hrs after increasing heparin infusion to 1800 units/hr is 0.34 units/ml, which is at the lower end of the goal range for this pt. H/H, platelets trending down. Per RN, no issues with IV or bleeding observed.  Goal of Therapy:  Heparin level 0.3-0.7 units/ml   Plan:  Continue heparin infusion at 1800 units/hr Check 6-hr heparin level Monitor daily heparin level, CBC Monitor for signs/symptoms of bleeding F/U oral anticoagulation when able  Vicki Mallet, PharmD, BCPS, [facility] Clinical Pharmacist  01/18/2020 6:31 PM

## 2020-01-18 NOTE — Progress Notes (Addendum)
[facility]  PROGRESS NOTE  Robert FERENCZ ZOX:[id number] DOB: [birth year] DOA: 01/15/2020 PCP: Doristine Bosworth, MD   Brief HPI:   64 year old male with history of CAD s/p CABG, PVD, s/p hypertension, hyperlipidemia, COPD, diabetes mellitus type 2 with neuropathy, CKD stage III, GERD presented with complaints of left foot pain and redness over the past 2 to 3 days.  He previously had left femoral to posterior tibial artery grafting performed in October 2019 by Dr. Chestine Spore of vascular surgery.  Following bypass surgery he reported that his foot was getting good blood flow but he was never able to move the lateral 3 toes.  X-ray of the foot in the ED showed subcutaneous swelling, patient was started on empiric antibiotics, Zosyn.  Vascular surgery was consulted.    Subjective   Patient seen and examined, feels better today.  Foot pain has improved.  Patient was on thrombolysis for occluded left lower extremity bypass, vascular surgery was able to open it after mechanical thrombectomy.  Good blood flow obtained in the left lower extremity.   Assessment/Plan:     1. Cellulitis of left foot-patient presented with redness and increased warmth of left foot.  No clear signs of injury noted.  X-ray imaging showed diffuse soft tissue edema concerning for cellulitis.  Patient was started on vancomycin and Zosyn.  We will continue with antibiotics at this time. 2. Peripheral arterial disease/critical limb ischemia-patient has history of left femoral to posterior tibial bypass with reverse saphenous vein on 10/19.  Vascular surgery was consulted.  Vascular ultrasound with ABI showed severe left lower extremity arterial disease, patient underwent aortogram, left lower extremity arteriogram with placement of thrombolytic catheter in the left lower extremity bypass with initiation of thrombolysis.  Patient received IV alteplase, IV heparin.  Underwent mechanical thrombectomy yesterday and also thrombolysis  catheter check for occluded left lower extremity bypass.  Continue with IV heparin for now.  As per vascular surgery patient will transition to oral anticoagulation in the next few days.   3. Diabetes mellitus type 2-patient is glipizide and Metformin at home.  Oral medications are currently on hold.  Continue sliding scale insulin with NovoLog.  CBG well controlled.   4. CKD stage III-patient's creatinine is 1.23, back to baseline.    Follow BMP in a.m. 5. Acute on chronic systolic CHF exacerbation-chest x-ray was concerning for cardiomegaly with mild interstitial edema.  He received 1 dose of Lasix 40 mg IV.  At this time patient denies shortness of breath, O2 sats 97% on room air.  We will continue to monitor.    SpO2: 97 % O2 Flow Rate (L/min): 2 L/min   COVID-19 Labs  No results for input(s): DDIMER, FERRITIN, LDH, CRP in the last 72 hours.  Lab Results  Component Value Date   SARSCOV2NAA NEGATIVE 01/15/2020   SARSCOV2NAA NEGATIVE 05/04/2019   SARSCOV2NAA Not Detected 05/01/2019     CBG: Recent Labs  Lab 01/17/20 1121 01/17/20 1653 01/17/20 2125 01/18/20 0631 01/18/20 1126  GLUCAP 152* 148* 127* 147* 216*    CBC: Recent Labs  Lab 01/15/20 0750 01/15/20 0750 01/16/20 0244 01/16/20 1920 01/16/20 2229 01/17/20 0642 01/18/20 0300  WBC 12.5*   < > 11.7* 11.2* 10.7* 10.2 9.6  NEUTROABS 9.4*  --   --   --   --   --   --   HGB 13.4   < > 13.1 13.3 12.7* 12.2* 11.6*  HCT 41.7   < > 40.2 41.1 38.8* 37.1* 35.8*  MCV 92.3   < > 92.0 92.6 92.2 91.6 92.7  PLT 314   < > 278 287 273 271 247   < > = values in this interval not displayed.    Basic Metabolic Panel: Recent Labs  Lab 01/15/20 0750 01/16/20 0244 01/17/20 0642 01/18/20 0300  NA 139 136 138 138  K 4.5 4.3 4.3 4.0  CL 105 101 104 106  CO2 22 20* 22 21*  GLUCOSE 234* 174* 165* 165*  BUN 23 26* 20 16  CREATININE 1.32* 1.20 1.38* 1.23  CALCIUM 9.3 9.0 8.8* 8.8*     Liver Function Tests: Recent Labs  Lab  01/15/20 0750 01/18/20 0300  AST 13* 11*  ALT 16 11  ALKPHOS 68 44  BILITOT 0.8 1.2  PROT 7.9 6.8  ALBUMIN 3.7 3.0*        DVT prophylaxis: Heparin  Code Status: Full code  Family Communication: No family at bedside  Disposition Plan: Patient is on IV heparin, and IV antibiotics for cellulitis.-Barrier to discharge-continues on IV heparin, will transition to oral anticoagulation next 2 to 3 days.           Scheduled medications:  . aspirin  325 mg Oral QODAY  . atorvastatin  40 mg Oral QHS  . carvedilol  6.25 mg Oral BID  . Chlorhexidine Gluconate Cloth  6 each Topical Q0600  . insulin aspart  0-15 Units Subcutaneous TID WC  . insulin aspart  0-5 Units Subcutaneous QHS  . mupirocin ointment  1 application Nasal BID  . sodium chloride flush  3 mL Intravenous Q12H    Consultants:  Vascular surgery  Procedures:    Antibiotics:   Anti-infectives (From admission, onward)   Start     Dose/Rate Route Frequency Ordered Stop   01/17/20 1200  vancomycin (VANCOREADY) IVPB 1250 mg/250 mL     1,250 mg 166.7 mL/hr over 90 Minutes Intravenous Every 24 hours 01/16/20 1834     01/16/20 2200  piperacillin-tazobactam (ZOSYN) IVPB 3.375 g     3.375 g 12.5 mL/hr over 240 Minutes Intravenous Every 8 hours 01/16/20 1834     01/16/20 1300  vancomycin (VANCOREADY) IVPB 1250 mg/250 mL  Status:  Discontinued     1,250 mg 166.7 mL/hr over 90 Minutes Intravenous Every 24 hours 01/15/20 1212 01/16/20 1834   01/15/20 1700  piperacillin-tazobactam (ZOSYN) IVPB 3.375 g  Status:  Discontinued     3.375 g 12.5 mL/hr over 240 Minutes Intravenous Every 8 hours 01/15/20 1212 01/16/20 1834   01/15/20 1215  vancomycin (VANCOCIN) IVPB 1000 mg/200 mL premix  Status:  Discontinued     1,000 mg 200 mL/hr over 60 Minutes Intravenous  Once 01/15/20 1202 01/15/20 1208   01/15/20 1215  vancomycin (VANCOREADY) IVPB 2000 mg/400 mL     2,000 mg 200 mL/hr over 120 Minutes Intravenous  Once 01/15/20  1208 01/15/20 1823   01/15/20 0915  piperacillin-tazobactam (ZOSYN) IVPB 3.375 g     3.375 g 100 mL/hr over 30 Minutes Intravenous  Once 01/15/20 0905 01/15/20 0952       Objective   Vitals:   01/17/20 2317 01/18/20 0315 01/18/20 0800 01/18/20 1124  BP: 123/67 120/63 108/67 (!) 142/57  Pulse: 82 76 70 79  Resp: 12 15 15 13   Temp: 98.2 F (36.8 C) 98.4 F (36.9 C) 97.6 F (36.4 C) 98.3 F (36.8 C)  TempSrc: Oral Oral Oral Oral  SpO2: 98% 96% 100% 97%  Weight:      Height:  Intake/Output Summary (Last 24 hours) at 01/18/2020 1341 Last data filed at 01/18/2020 0400 Gross per 24 hour  Intake 1499.73 ml  Output 450 ml  Net 1049.73 ml    03/17 1901 - 03/19 0700 In: 3002.9 [P.O.:400; I.V.:2347.9] Out: 1150 [Urine:1150]  Filed Weights   01/15/20 0743 01/16/20 0051 01/17/20 0500  Weight: 97.5 kg 97.8 kg 98 kg    Physical Examination:  General-appears in no acute distress Heart-S1-S2, regular, no murmur auscultated Lungs-clear to auscultation bilaterally, no wheezing or crackles auscultated Abdomen-soft, nontender, no organomegaly Extremities-dorsum of left foot is erythematous, warm to touch.  Cyanosis of left great toe. Neuro-alert, oriented x3, no focal deficit noted  Data Reviewed:   Recent Results (from the past 240 hour(s))  SARS CORONAVIRUS 2 (TAT 6-24 HRS) Nasopharyngeal Nasopharyngeal Swab     Status: None   Collection Time: 01/15/20  9:44 AM   Specimen: Nasopharyngeal Swab  Result Value Ref Range Status   SARS Coronavirus 2 NEGATIVE NEGATIVE Final    Comment: (NOTE) SARS-CoV-2 target nucleic acids are NOT DETECTED. The SARS-CoV-2 RNA is generally detectable in upper and lower respiratory specimens during the acute phase of infection. Negative results do not preclude SARS-CoV-2 infection, do not rule out co-infections with other pathogens, and should not be used as the sole basis for treatment or other patient management decisions. Negative  results must be combined with clinical observations, patient history, and epidemiological information. The expected result is Negative. Fact Sheet for Patients: HairSlick.nohttps://www.fda.gov/media/138098/download Fact Sheet for Healthcare Providers: quierodirigir.comhttps://www.fda.gov/media/138095/download This test is not yet approved or cleared by the [place] FDA and  has been authorized for detection and/or diagnosis of SARS-CoV-2 by FDA under an Emergency Use Authorization (EUA). This EUA will remain  in effect (meaning this test can be used) for the duration of the COVID-19 declaration under Section 56 4(b)(1) of the Act, 21 U.S.C. section 360bbb-3(b)(1), unless the authorization is terminated or revoked sooner. Performed at [facility], [address] [place], [place] [address]   MRSA PCR Screening     Status: None   Collection Time: 01/16/20  6:30 PM   Specimen: Nasal Mucosa; Nasopharyngeal  Result Value Ref Range Status   MRSA by PCR NEGATIVE NEGATIVE Final    Comment:        The GeneXpert MRSA Assay (FDA approved for NASAL specimens only), is one component of a comprehensive MRSA colonization surveillance program. It is not intended to diagnose MRSA infection nor to guide or monitor treatment for MRSA infections. Performed at [facility], [address] [place], [place] [address]     No results for input(s): LIPASE, AMYLASE in the last 168 hours. No results for input(s): AMMONIA in the last 168 hours.  Cardiac Enzymes: No results for input(s): CKTOTAL, CKMB, CKMBINDEX, TROPONINI in the last 168 hours. BNP (last 3 results) Recent Labs    05/04/19 1658 01/15/20 2228  BNP 1,562.1* 244.9*    ProBNP (last 3 results) No results for input(s): PROBNP in the last 8760 hours.  Studies:  PERIPHERAL VASCULAR CATHETERIZATION  Result Date: 01/17/2020 Patient name: Ronda Fairlyhomas E Waszak     MRN: [id number]        DOB: 01-Jan-1956          Sex: male  01/17/2020 Pre-operative  Diagnosis: Critical limb ischemia of the left lower extremity with occluded common femoral to posterior tibial artery vein bypass Post-operative diagnosis:  Same Surgeon:  Cephus Shellinghristopher J. Clark, MD Procedure Performed: 1.  Thrombolytics catheter  check of the left lower extremity including left lower extremity arteriogram with selection of third order branches 2.  Left common femoral to posterior tibial artery vein bypass angioplasty (4 mm x 220 mm Sterling and 5 mm x 220 mm Sterling) 3.  Percutaneous mechanical thrombectomy of the left common femoral to posterior tibial artery bypass (CAT 6 penumbra percutaneous mechanical thrombectomy) 4.  Left posterior tibial artery angioplasty (3 mm x 150 mm Sterling) 5.  Mynx closure of the right common femoral artery 6.  78 minutes of monitored moderate conscious sedation time  Indications:   64 year old male who presented with occluded left common femoral to posterior tibial artery vein bypass after 3 days of worsening left foot pain and cellulitis.  He underwent thrombolytics catheter placement in the bypass yesterday and has been undergoing thrombolysis in the ICU with alteplase.  He presents today after risk benefits were discussed.  Findings:  Thrombolytics catheter check showed diminutive flow down the left common femoral to posterior tibial artery bypass although it was now patent with flow into the ankle at the posterior tibial.  Initially performed balloon angioplasty of the entire bypass initially with a 4 mm x 220 mm Sterling and then a 5 mm x 220 mm Sterling.  On repeat injection, I could not identify any flow down the bypass and all flow was down the profunda.  Ultimately we elected to perform percutaneous mechanical thrombectomy with penumbra cat 6 catheter and retrieved several large clots proximally.  I then had reestablished flow down the bypass with inline flow into the ankle at the posterior tibial.  There was a diseased posterior tibial proximally we  treated with a 3 mm Sterling.  He has a very brisk posterior tibial signal at the ankle.  All catheters were removed and mynx closure was used to close the right common femoral artery.  He will be restarted on heparin immediately.             Procedure:  The patient was identified in the holding area and taken to room 8.  The patient was then placed supine on the table and prepped and draped in the usual sterile fashion.  A time out was called.  Initially confirmed that the thrombolytics catheter remained in the left lower extremity bypass and we removed the inner cannula and placed a long V 18 wire down distal into the calf in the distal bypass.  The Unifuse catheter was subsequently removed.  Ultimately we then performed hand injections of the left lower extremity bypass and this showed now evidence of flow down the bypass although it looked very diminutive.  Subsequently elected to perform balloon angioplasty and started with a 4 mm x 220 mm Sterling and angioplastied the entire length of the bypass from the common femoral anastomosis all the way down to the PT.  There was a significant a waste in the balloon proximally and my impression was there was some sclerotic narrowed segment of the bypass in the proximal segment whereas this bypass was more robust distally.  We gave initially 5000 units of IV heparin at the start of the case and then we checked ACT and were subtherapeutic and gave an additional 5000 units and then 3000 units to get therapeutic.  Upon another injection we had no flow down the bypass after balloon angioplasty.  I then elected to upsized to a 5 mm x 220 m Sterling and performed balloon angioplasty from the proximal anastomosis all the way down to the knee  joint where there appeared to be a narrowing in the bypass based on waste in the balloon from our initial angioplasty with a 4 mm balloon.  Again more distal it looked robust and healthy.  Again I had no flow down the bypass and given a  subtherapeutic ACT initially, I was suspicious that there was thrombus again in the bypass graft or remnant thrombus.  I elected to perform percutaneous mechanical thrombectomy we then put a long Rosen wire down the bypass and exchanged for a 7 [place] destination sheath placed over the aortic bifurcation.  We then used a long 0.035 exchange catheter and put our V 18 wire back down.  I then performed percutaneous mechanical thrombectomy of the bypass throughout the entire length with a CAT 6 catheter.  We retrieve several large lots from the proximal bypass.  After that we then had persistent flow in the aspiration catheter throughout the entire length of the bypass suggesting we had reestablished flow.  I then shot another picture thought the sheath and this looked much better with inline flow down the bypass after percutaneous mechanical thrombectomy.  I then reevaluated and the only other residual stenosis I saw was in the PT distal to the anastomosis we selected a 3 mm Sterling and perform balloon angioplasty to nominal pressure for 2 minutes of the PT and distal anastomosis for 2 minutes at nominal pressure.  Once we were satisfied with the results and had excellent inline flow into the foot wires and catheters were removed.  There was brisk flow into the foot.  We used a J-wire and exchanged for a short 7 French sheath in the right groin.  Ultimately mynx closure was deployed in the right groin.  Pressure was held.  Plan: Patient will be immediately restarted on heparin without bolus.  He has reeestablished flow down the left lower extremity bypass with a brisk posterior tibial signal.    [name], MD [facility] [place] Office: [phone]     Admission status: The appropriate admission status for this patient is INPATIENT. Inpatient status is judged to be reasonable and necessary in order to provide the required intensity of service to ensure the patient's safety.  The patient's presenting symptoms, physical exam findings, and initial radiographic and laboratory data in the context of their chronic comorbidities is felt to place them at high risk for further clinical deterioration. Furthermore, it is not anticipated that the patient will be medically stable for discharge from the hospital within 2 midnights of admission. The following factors support the admission status of inpatient.    The patient's presenting symptoms include limb pain The worrisome physical exam findings include critical limb ischemia, cellulitis The initial radiographic and laboratory data are worrisome because of critical limb ischemia seen on ABI vascular ultrasound The chronic co-morbidities include peripheral vascular disease    * I certify that at the point of admission it is my clinical judgment that the patient will require inpatient hospital care spanning beyond 2 midnights from the point of admission due to high intensity of service, high risk for further deterioration and high frequency of surveillance required.Oswald Hillock   [facility] If 7PM-7AM, please contact night-coverage at [email or web address], Office  [phone]   01/18/2020, 1:41 PM  LOS: 3 days

## 2020-01-18 NOTE — Progress Notes (Deleted)
MOBILITY TEAM - Progress Note   01/18/20 1440  Mobility  Activity Refused mobility   Educ on importance of BLE ROM and strengthening; pt declining mobility secondary to pain and fatigue.  Ina Homes, PT, DPT Mobility Team Pager [phone]

## 2020-01-18 NOTE — Progress Notes (Addendum)
MOBILITY TEAM - Progress Note   01/18/20 1530  Mobility  Activity Ambulated in hall  Level of Assistance Modified independent, requires aide device or extra time  Assistive Device  (IV pole)  Distance Ambulated (ft) 350 ft  Mobility Response Tolerated well   Assist with NT. Pt tolerated well, moving independently. C/o foot pain.  Ina Homes, PT, DPT Mobility Team Pager [phone]

## 2020-01-18 NOTE — Progress Notes (Signed)
ANTICOAGULATION CONSULT NOTE - Follow Up Consult  Pharmacy Consult for heparin Indication: ischemic leg  Labs: Recent Labs    01/15/20 0750 01/15/20 0750 01/16/20 0244 01/16/20 1920 01/16/20 2229 01/16/20 2229 01/17/20 0642 01/17/20 1808 01/18/20 0300  HGB 13.4   < > 13.1   < > 12.7*   < > 12.2*  --  11.6*  HCT 41.7   < > 40.2   < > 38.8*  --  37.1*  --  35.8*  PLT 314   < > 278   < > 273  --  271  --  247  LABPROT  --   --  15.1  --   --   --   --   --   --   INR  --   --  1.2  --   --   --   --   --   --   HEPARINUNFRC  --   --  1.09*   < > 0.12*   < > 0.12* 0.43 0.23*  CREATININE 1.32*  --  1.20  --   --   --  1.38*  --   --    < > = values in this interval not displayed.    Assessment: 64yo male subtherapeutic on heparin after one level at goal; no gtt issues or signs of bleeding per RN.  Goal of Therapy:  Heparin level 0.3-0.7 units/ml   Plan:  Will increase heparin gtt by 2 units/kg/hr to 1700 units/hr and check level in 6hr.    Vernard Gambles, PharmD, BCPS  01/18/2020,4:20 AM

## 2020-01-18 NOTE — Progress Notes (Signed)
ANTICOAGULATION CONSULT NOTE - Follow Up Consult  Pharmacy Consult for heparin Indication: ischemic leg  Labs: Recent Labs    01/16/20 0244 01/16/20 1920 01/16/20 2229 01/16/20 2229 01/17/20 0642 01/17/20 0642 01/17/20 1808 01/18/20 0300 01/18/20 1025  HGB 13.1   < > 12.7*   < > 12.2*  --   --  11.6*  --   HCT 40.2   < > 38.8*  --  37.1*  --   --  35.8*  --   PLT 278   < > 273  --  271  --   --  247  --   LABPROT 15.1  --   --   --   --   --   --   --   --   INR 1.2  --   --   --   --   --   --   --   --   HEPARINUNFRC 1.09*   < > 0.12*   < > 0.12*   < > 0.43 0.23* 0.29*  CREATININE 1.20  --   --   --  1.38*  --   --  1.23  --    < > = values in this interval not displayed.   . sodium chloride Stopped (01/16/20 0817)  . sodium chloride 10 mL/hr at 01/16/20 1038  . sodium chloride 100 mL/hr at 01/17/20 0900  . sodium chloride    . heparin 1,700 Units/hr (01/18/20 5462)  . piperacillin-tazobactam (ZOSYN)  IV 3.375 g (01/18/20 0814)  . vancomycin       Assessment: 64yo male s/p thrombolysis, mechanical thrombectomy, on IV Heparin.  Heparin level slightly below goal on 1700 units/hr.  No overt bleeding or complications noted.  No known issues with heparin infusion.  Goal of Therapy:  Heparin level 0.3-0.7 units/ml   Plan:  1. Increase IV heparin to 1800 units/hr. 2. Recheck heparin level in 8 hrs. 3. Daily heparin level and CBC. 4. F/u oral anticoagulation once able.  Jenetta Downer, [facility] Clinical Pharmacist Phone [phone]  01/18/2020 12:43 PM

## 2020-01-18 NOTE — Progress Notes (Addendum)
MOBILITY TEAM - Progress Note   01/18/20 1016  Mobility  Activity Transferred:  Bed to chair  Level of Assistance Modified independent, requires aide device or extra time  Assistive Device None  Mobility Response Tolerated fair  Mobility performed by Mobility specialist  Bed Position Chair   Patient declined ambulation secondary to L foot pain; agreeable to transfer to recliner, able to do so with mod indep, UE support on recliner. Encouraged use of rolling walker to offload painful extremity. Reports owning necessary DME.  Ina Homes, PT, DPT Mobility Team Pager [phone]

## 2020-01-18 NOTE — Progress Notes (Signed)
Pharmacy Antibiotic Note  Robert Sanchez is a 64 y.o. male admitted on 01/15/2020 with left groin pain and redness.  Patient has a history of left fem-pop bypass 08/2018 due to critical ischemia.  Pharmacy has been consulted for vancomycin and Zosyn dosing for cellulitis.  SCr 1.23, CrCL 66 ml/min, afebrile, WBC 9.6.  Plan: Continue vancomycin 1250mg  IV Q24H Zosyn EID 3.375gm IV Q8H Monitor renal fxn, clinical progress, vanc AUC as indicated  Height: 5\' 9"  (175.3 cm) Weight: 216 lb 0.8 oz (98 kg) IBW/kg (Calculated) : 70.7  Temp (24hrs), Avg:98 F (36.7 C), Min:97.6 F (36.4 C), Max:98.4 F (36.9 C)  Recent Labs  Lab 01/15/20 0750 01/15/20 0750 01/15/20 1007 01/16/20 0244 01/16/20 1920 01/16/20 2229 01/17/20 0642 01/18/20 0300  WBC 12.5*   < >  --  11.7* 11.2* 10.7* 10.2 9.6  CREATININE 1.32*  --   --  1.20  --   --  1.38* 1.23  LATICACIDVEN 2.1*  --  1.9  --   --   --   --   --    < > = values in this interval not displayed.    Estimated Creatinine Clearance: 72.8 mL/min (by C-G formula based on SCr of 1.23 mg/dL).    Allergies  Allergen Reactions  . Lisinopril Other (See Comments)    Syncope  . Codeine Rash   Antimicrobials this admission:  Vanc 3/16 >> Zosyn 3/16 >>  Dose adjustments this admission:   Microbiology results:  No cultures  4/16, [facility] Clinical Pharmacist Phone [phone]  01/18/2020 12:48 PM

## 2020-01-19 LAB — BASIC METABOLIC PANEL
Anion gap: 11 (ref 5–15)
BUN: 17 mg/dL (ref 8–23)
CO2: 19 mmol/L — ABNORMAL LOW (ref 22–32)
Calcium: 8.6 mg/dL — ABNORMAL LOW (ref 8.9–10.3)
Chloride: 105 mmol/L (ref 98–111)
Creatinine, Ser: 1.38 mg/dL — ABNORMAL HIGH (ref 0.61–1.24)
GFR calc Af Amer: >60 mL/min (ref >60)
GFR calc non Af Amer: 55 mL/min — ABNORMAL LOW (ref >60)
Glucose, Bld: 153 mg/dL — ABNORMAL HIGH (ref 70–99)
Potassium: 4.1 mmol/L (ref 3.5–5.1)
Sodium: 135 mmol/L (ref 135–145)

## 2020-01-19 LAB — CBC
HCT: 32.8 % — ABNORMAL LOW (ref 39.0–52.0)
Hemoglobin: 10.6 g/dL — ABNORMAL LOW (ref 13.0–17.0)
MCH: 29.8 pg (ref 26.0–34.0)
MCHC: 32.3 g/dL (ref 30.0–36.0)
MCV: 92.1 fL (ref 80.0–100.0)
Platelets: 238 10*3/uL (ref 150–400)
RBC: 3.56 MIL/uL — ABNORMAL LOW (ref 4.22–5.81)
RDW: 13.6 % (ref 11.5–15.5)
WBC: 9.4 10*3/uL (ref 4.0–10.5)
nRBC: 0 % (ref 0.0–0.2)

## 2020-01-19 LAB — HEPARIN LEVEL (UNFRACTIONATED)
Heparin Unfractionated: 0.27 IU/mL — ABNORMAL LOW (ref 0.30–0.70)
Heparin Unfractionated: 0.44 IU/mL (ref 0.30–0.70)
Heparin Unfractionated: 0.47 IU/mL (ref 0.30–0.70)

## 2020-01-19 LAB — GLUCOSE, CAPILLARY
Glucose-Capillary: 145 mg/dL — ABNORMAL HIGH (ref 70–99)
Glucose-Capillary: 191 mg/dL — ABNORMAL HIGH (ref 70–99)
Glucose-Capillary: 181 mg/dL — ABNORMAL HIGH (ref 70–99)
Glucose-Capillary: 157 mg/dL — ABNORMAL HIGH (ref 70–99)

## 2020-01-19 LAB — VANCOMYCIN, PEAK: Vancomycin Pk: 28 ug/mL — ABNORMAL LOW (ref 30–40)

## 2020-01-19 MED ORDER — HEPARIN (PORCINE) 25000 UT/250ML-% IV SOLN
1950.0000 [IU]/h | INTRAVENOUS | Status: DC
  Administered 2020-01-20 – 2020-01-21 (×3): 1950 [IU]/h via INTRAVENOUS
  Filled 2020-01-19 (×5): qty 250

## 2020-01-19 NOTE — Progress Notes (Signed)
ANTICOAGULATION CONSULT NOTE - Follow Up Consult  Pharmacy Consult for heparin Indication: ischemic leg  Labs: Recent Labs    01/17/20 0642 01/17/20 1808 01/18/20 0300 01/18/20 1025 01/19/20 0224 01/19/20 0921 01/19/20 1609  HGB 12.2*  --  11.6*  --  10.6*  --   --   HCT 37.1*  --  35.8*  --  32.8*  --   --   PLT 271  --  247  --  238  --   --   HEPARINUNFRC 0.12*   < > 0.23*   < > 0.27* 0.44 0.47  CREATININE 1.38*  --  1.23  --  1.38*  --   --    < > = values in this interval not displayed.    Assessment: 64yo male with ischemic leg s/p thrombolysis, mechanical thrombectomy, on IV Heparin.   Confirmatory heparin level is therapeutic at 0.47.  Goal of Therapy:  Heparin level 0.3-0.7 units/ml   Plan:  Continue heparin infusion at 1950 units/hr Daily heparin level and CBC. Monitor for signs/symptoms of bleeding  F/u switch to oral anticoagulation prior to discharge   Alphia Moh, PharmD, BCPS, [facility] Clinical Pharmacist  Please check AMION for all [facility] Pharmacy phone numbers After 10:00 PM, call Main Pharmacy [phone]

## 2020-01-19 NOTE — Progress Notes (Signed)
ANTICOAGULATION CONSULT NOTE - Follow Up Consult  Pharmacy Consult for heparin Indication: ischemic leg  Labs: Recent Labs    01/17/20 0642 01/17/20 1808 01/18/20 0300 01/18/20 1025 01/18/20 1735 01/19/20 0224 01/19/20 0921  HGB 12.2*  --  11.6*  --   --  10.6*  --   HCT 37.1*  --  35.8*  --   --  32.8*  --   PLT 271  --  247  --   --  238  --   HEPARINUNFRC 0.12*   < > 0.23*   < > 0.34 0.27* 0.44  CREATININE 1.38*  --  1.23  --   --  1.38*  --    < > = values in this interval not displayed.   . sodium chloride Stopped (01/16/20 0817)  . sodium chloride 10 mL/hr at 01/16/20 1038  . sodium chloride 100 mL/hr at 01/17/20 0900  . sodium chloride    . heparin 1,950 Units/hr (01/19/20 0344)  . piperacillin-tazobactam (ZOSYN)  IV 3.375 g (01/19/20 0930)  . vancomycin 1,250 mg (01/18/20 1249)     Assessment: 64yo male s/p thrombolysis, mechanical thrombectomy, on IV Heparin.  Heparin level is therapeutic at 0.44 on heparin rate of 1950 units/hr.  No overt bleeding or complications per RN.  No known issues with heparin infusion. Hg decreased from 11.6 to 10.6. Plts are wnls  Goal of Therapy:  Heparin level 0.3-0.7 units/ml   Plan:  1. Continue heparin infusion at 1950 units/hr 2. Recheck heparin level in 6 hrs. 3. Daily heparin level and CBC. Monitor Hg closely as there was a drop in Hg but could be dilutional 4. F/u oral anticoagulation once able.  Alvia Grove, PharmD PGY1 Acute Care Pharmacy Resident Phone [phone]  01/19/2020 10:11 AM

## 2020-01-19 NOTE — Progress Notes (Addendum)
  Progress Note    01/19/2020 9:47 AM 2 Days Post-Op  Subjective:  Says his left foot feels better but still not normal  Afebrile HR 70's-80's NSR 90's-130's systolic 98% RA  Vitals:   01/19/20 0406 01/19/20 0740  BP: 96/83 130/71  Pulse: 74 68  Resp: 19 19  Temp: 97.8 F (36.6 C) 97.8 F (36.6 C)  SpO2: 97% 98%    Physical Exam: Cardiac:  regular Lungs:  Non labored Extremities:  Brisk left PT doppler signal; left great toe cyanotic and continues to demarcate; cellulitis greatly improved.   CBC    Component Value Date/Time   WBC 9.4 01/19/2020 0224   RBC 3.56 (L) 01/19/2020 0224   HGB 10.6 (L) 01/19/2020 0224   HCT 32.8 (L) 01/19/2020 0224   PLT 238 01/19/2020 0224   MCV 92.1 01/19/2020 0224   MCH 29.8 01/19/2020 0224   MCHC 32.3 01/19/2020 0224   RDW 13.6 01/19/2020 0224   LYMPHSABS 1.5 01/15/2020 0750   MONOABS 1.2 (H) 01/15/2020 0750   EOSABS 0.2 01/15/2020 0750   BASOSABS 0.1 01/15/2020 0750    BMET    Component Value Date/Time   NA 135 01/19/2020 0224   NA 136 09/14/2019 1701   K 4.1 01/19/2020 0224   CL 105 01/19/2020 0224   CO2 19 (L) 01/19/2020 0224   GLUCOSE 153 (H) 01/19/2020 0224   BUN 17 01/19/2020 0224   BUN 20 09/14/2019 1701   CREATININE 1.38 (H) 01/19/2020 0224   CREATININE 0.81 08/14/2014 1126   CALCIUM 8.6 (L) 01/19/2020 0224   GFRNONAA 55 (L) 01/19/2020 0224   GFRNONAA >89 08/14/2014 1126   GFRAA >60 01/19/2020 0224   GFRAA >89 08/14/2014 1126    INR    Component Value Date/Time   INR 1.2 01/16/2020 0244     Intake/Output Summary (Last 24 hours) at 01/19/2020 0947 Last data filed at 01/19/2020 0411 Gross per 24 hour  Intake 1189.63 ml  Output 1270 ml  Net -80.37 ml     Assessment:  64 y.o. male is s/p:  1.Thrombolytics catheter check of the left lower extremity including left lower extremity arteriogram with selection of third order branches 2.Left common femoral to posterior tibial artery vein bypass  angioplasty (4 mm x 220 mm Sterling and 5 mm x 220 mm Sterling) 3.Percutaneous mechanical thrombectomy of the left common femoral to posterior tibial artery bypass (CAT6 penumbra percutaneous mechanical thrombectomy) 4.Left posterior tibial artery angioplasty (3 mm x 150 mm Sterling) 5.Mynx closure of the right common femoral artery  2 Days Post-Op  Plan: -pt with brisk left PT/peroneal and faint DP doppler signals -erythema almost resolved -continue to let left great toe demarcate-may need amputation at some point if it does not improve -DVT prophylaxis:  Continue heparin gtt while in hospital as he is continuing abx for left leg/foot cellulitis and it does look much better.  Will need to be converted to oral AC prior to discharge. -creatinine 1.38 at baseline   Doreatha Massed, PA-C [facility] [phone] 01/19/2020 9:47 AM  Dusky left first toe.  PT peroneal doppler.  Still on antibiotics for left foot cellulitis.  Edema persists but redness improving.  Plan is to start oral anticoagulation after a few more days of foot improvement with antibiotics.  He is currently on heparin.  Fabienne Bruns, MD [facility] [place] Office: [phone]

## 2020-01-19 NOTE — Progress Notes (Signed)
ANTICOAGULATION CONSULT NOTE - Follow Up Consult  Pharmacy Consult for Heparin Indication: ischemic leg  Labs: Recent Labs    01/17/20 0642 01/17/20 1808 01/18/20 0300 01/18/20 0300 01/18/20 1025 01/18/20 1735 01/19/20 0224  HGB 12.2*  --  11.6*  --   --   --  10.6*  HCT 37.1*  --  35.8*  --   --   --  32.8*  PLT 271  --  247  --   --   --  238  HEPARINUNFRC 0.12*   < > 0.23*   < > 0.29* 0.34 0.27*  CREATININE 1.38*  --  1.23  --   --   --   --    < > = values in this interval not displayed.   . sodium chloride Stopped (01/16/20 0817)  . sodium chloride 10 mL/hr at 01/16/20 1038  . sodium chloride 100 mL/hr at 01/17/20 0900  . sodium chloride    . heparin 1,800 Units/hr (01/18/20 2139)  . piperacillin-tazobactam (ZOSYN)  IV 3.375 g (01/19/20 0207)  . vancomycin 1,250 mg (01/18/20 1249)     Assessment: 64 yr old male, S/P thrombolysis, mechanical thrombectomy, on IV heparin.    Heparin level down to subtherapeutic (0.27) on gtt at 1800 units/hr. No issues with line or bleeding reported per RN.  Goal of Therapy:  Heparin level 0.3-0.7 units/ml   Plan:  Increase heparin infusion to 1950 units/hr F/u 6 hr heparin level  Christoper Fabian, PharmD, BCPS Please see amion for complete clinical pharmacist phone list  01/19/2020 3:39 AM

## 2020-01-19 NOTE — Progress Notes (Signed)
[facility]  PROGRESS NOTE  Robert Sanchez ION:[id number] DOB: [birth year] DOA: 01/15/2020 PCP: Doristine Bosworth, MD   Brief HPI:   64 year old male with history of CAD s/p CABG, PVD, s/p hypertension, hyperlipidemia, COPD, diabetes mellitus type 2 with neuropathy, CKD stage III, GERD presented with complaints of left foot pain and redness over the past 2 to 3 days.  He previously had left femoral to posterior tibial artery grafting performed in October 2019 by Dr. Chestine Spore of vascular surgery.  Following bypass surgery he reported that his foot was getting good blood flow but he was never able to move the lateral 3 toes.  X-ray of the foot in the ED showed subcutaneous swelling, patient was started on empiric antibiotics, Zosyn.  Vascular surgery was consulted.    Subjective   Patient seen and examined, denies any complaints.   Assessment/Plan:     1. Cellulitis of left foot-patient presented with redness and increased warmth of left foot.  No clear signs of injury noted.  X-ray imaging showed diffuse soft tissue edema concerning for cellulitis.  Patient was started on vancomycin and Zosyn.  We will continue with antibiotics at this time.  Day #4/7 2. Peripheral arterial disease/critical limb ischemia-patient has history of left femoral to posterior tibial bypass with reverse saphenous vein on 10/19.  Vascular surgery was consulted.  Vascular ultrasound with ABI showed severe left lower extremity arterial disease, patient underwent aortogram, left lower extremity arteriogram with placement of thrombolytic catheter in the left lower extremity bypass with initiation of thrombolysis.  Patient received IV alteplase, IV heparin.  Underwent mechanical thrombectomy yesterday and also thrombolysis catheter check for occluded left lower extremity bypass.  Continue with IV heparin for now.  As per vascular surgery patient will transition to oral anticoagulation in the next few days.   3. Diabetes  mellitus type 2-patient is glipizide and Metformin at home.  Oral medications are currently on hold.  Continue sliding scale insulin with NovoLog.  CBG well controlled.   4. CKD stage III-patient's creatinine is 1.23, back to baseline. Follow BMP in a.m. 5. Acute on chronic systolic CHF exacerbation-chest x-ray was concerning for cardiomegaly with mild interstitial edema.  He received 1 dose of Lasix 40 mg IV.  At this time patient denies shortness of breath, O2 sats 97% on room air.  We will continue to monitor.    SpO2: 98 % O2 Flow Rate (L/min): 2 L/min   COVID-19 Labs  No results for input(s): DDIMER, FERRITIN, LDH, CRP in the last 72 hours.  Lab Results  Component Value Date   SARSCOV2NAA NEGATIVE 01/15/2020   SARSCOV2NAA NEGATIVE 05/04/2019   SARSCOV2NAA Not Detected 05/01/2019     CBG: Recent Labs  Lab 01/18/20 0631 01/18/20 1126 01/18/20 1535 01/18/20 2147 01/19/20 0634  GLUCAP 147* 216* 154* 205* 145*    CBC: Recent Labs  Lab 01/15/20 0750 01/16/20 0244 01/16/20 1920 01/16/20 2229 01/17/20 0642 01/18/20 0300 01/19/20 0224  WBC 12.5*   < > 11.2* 10.7* 10.2 9.6 9.4  NEUTROABS 9.4*  --   --   --   --   --   --   HGB 13.4   < > 13.3 12.7* 12.2* 11.6* 10.6*  HCT 41.7   < > 41.1 38.8* 37.1* 35.8* 32.8*  MCV 92.3   < > 92.6 92.2 91.6 92.7 92.1  PLT 314   < > 287 273 271 247 238   < > = values in this interval not displayed.  Basic Metabolic Panel: Recent Labs  Lab 01/15/20 0750 01/16/20 0244 01/17/20 0642 01/18/20 0300 01/19/20 0224  NA 139 136 138 138 135  K 4.5 4.3 4.3 4.0 4.1  CL 105 101 104 106 105  CO2 22 20* 22 21* 19*  GLUCOSE 234* 174* 165* 165* 153*  BUN 23 26* 20 16 17   CREATININE 1.32* 1.20 1.38* 1.23 1.38*  CALCIUM 9.3 9.0 8.8* 8.8* 8.6*     Liver Function Tests: Recent Labs  Lab 01/15/20 0750 01/18/20 0300  AST 13* 11*  ALT 16 11  ALKPHOS 68 44  BILITOT 0.8 1.2  PROT 7.9 6.8  ALBUMIN 3.7 3.0*        DVT prophylaxis:  Heparin  Code Status: Full code  Family Communication: No family at bedside  Disposition Plan: Patient is on IV heparin, and IV antibiotics for cellulitis.-Barrier to discharge-continues on IV heparin, will transition to oral anticoagulation next 2 to 3 days.  Also on IV antibiotics for cellulitis.         Scheduled medications:  . aspirin  325 mg Oral QODAY  . atorvastatin  40 mg Oral QHS  . carvedilol  6.25 mg Oral BID  . Chlorhexidine Gluconate Cloth  6 each Topical Q0600  . insulin aspart  0-15 Units Subcutaneous TID WC  . insulin aspart  0-5 Units Subcutaneous QHS  . mupirocin ointment  1 application Nasal BID  . sodium chloride flush  3 mL Intravenous Q12H    Consultants:  Vascular surgery  Procedures:    Antibiotics:   Anti-infectives (From admission, onward)   Start     Dose/Rate Route Frequency Ordered Stop   01/17/20 1200  vancomycin (VANCOREADY) IVPB 1250 mg/250 mL     1,250 mg 166.7 mL/hr over 90 Minutes Intravenous Every 24 hours 01/16/20 1834     01/16/20 2200  piperacillin-tazobactam (ZOSYN) IVPB 3.375 g     3.375 g 12.5 mL/hr over 240 Minutes Intravenous Every 8 hours 01/16/20 1834     01/16/20 1300  vancomycin (VANCOREADY) IVPB 1250 mg/250 mL  Status:  Discontinued     1,250 mg 166.7 mL/hr over 90 Minutes Intravenous Every 24 hours 01/15/20 1212 01/16/20 1834   01/15/20 1700  piperacillin-tazobactam (ZOSYN) IVPB 3.375 g  Status:  Discontinued     3.375 g 12.5 mL/hr over 240 Minutes Intravenous Every 8 hours 01/15/20 1212 01/16/20 1834   01/15/20 1215  vancomycin (VANCOCIN) IVPB 1000 mg/200 mL premix  Status:  Discontinued     1,000 mg 200 mL/hr over 60 Minutes Intravenous  Once 01/15/20 1202 01/15/20 1208   01/15/20 1215  vancomycin (VANCOREADY) IVPB 2000 mg/400 mL     2,000 mg 200 mL/hr over 120 Minutes Intravenous  Once 01/15/20 1208 01/15/20 1823   01/15/20 0915  piperacillin-tazobactam (ZOSYN) IVPB 3.375 g     3.375 g 100 mL/hr over 30  Minutes Intravenous  Once 01/15/20 0905 01/15/20 0952       Objective   Vitals:   01/19/20 0406 01/19/20 0635 01/19/20 0740 01/19/20 1116  BP: 96/83  130/71 (!) 117/93  Pulse: 74  68 64  Resp: 19  19 18   Temp: 97.8 F (36.6 C)  97.8 F (36.6 C) 97.8 F (36.6 C)  TempSrc: Oral  Oral Oral  SpO2: 97%  98% 98%  Weight:  99 kg    Height:        Intake/Output Summary (Last 24 hours) at 01/19/2020 1200 Last data filed at 01/19/2020 1016 Gross per 24  hour  Intake 1189.63 ml  Output 1795 ml  Net -605.37 ml    03/18 1901 - 03/20 0700 In: 1976.1 [P.O.:920; I.V.:594.5] Out: 1720 [Urine:1720]  Filed Weights   01/16/20 0051 01/17/20 0500 01/19/20 0635  Weight: 97.8 kg 98 kg 99 kg    Physical Examination:  General-appears in no acute distress Heart-S1-S2, regular, no murmur auscultated Lungs-clear to auscultation bilaterally, no wheezing or crackles auscultated Abdomen-soft, nontender, no organomegaly Extremities-left foot is erythematous, peripheral pulses palpable, cyanosis of left big toe noted. Neuro-alert, oriented x3, no focal deficit noted  Data Reviewed:   Recent Results (from the past 240 hour(s))  SARS CORONAVIRUS 2 (TAT 6-24 HRS) Nasopharyngeal Nasopharyngeal Swab     Status: None   Collection Time: 01/15/20  9:44 AM   Specimen: Nasopharyngeal Swab  Result Value Ref Range Status   SARS Coronavirus 2 NEGATIVE NEGATIVE Final    Comment: (NOTE) SARS-CoV-2 target nucleic acids are NOT DETECTED. The SARS-CoV-2 RNA is generally detectable in upper and lower respiratory specimens during the acute phase of infection. Negative results do not preclude SARS-CoV-2 infection, do not rule out co-infections with other pathogens, and should not be used as the sole basis for treatment or other patient management decisions. Negative results must be combined with clinical observations, patient history, and epidemiological information. The expected result is Negative. Fact  Sheet for Patients: HairSlick.no Fact Sheet for Healthcare Providers: quierodirigir.com This test is not yet approved or cleared by the [place] FDA and  has been authorized for detection and/or diagnosis of SARS-CoV-2 by FDA under an Emergency Use Authorization (EUA). This EUA will remain  in effect (meaning this test can be used) for the duration of the COVID-19 declaration under Section 56 4(b)(1) of the Act, 21 U.S.C. section 360bbb-3(b)(1), unless the authorization is terminated or revoked sooner. Performed at [facility], [address], [place], [place] [address]   MRSA PCR Screening     Status: None   Collection Time: 01/16/20  6:30 PM   Specimen: Nasal Mucosa; Nasopharyngeal  Result Value Ref Range Status   MRSA by PCR NEGATIVE NEGATIVE Final    Comment:        The GeneXpert MRSA Assay (FDA approved for NASAL specimens only), is one component of a comprehensive MRSA colonization surveillance program. It is not intended to diagnose MRSA infection nor to guide or monitor treatment for MRSA infections. Performed at [facility], [address] [place], [place] [address]     No results for input(s): LIPASE, AMYLASE in the last 168 hours. No results for input(s): AMMONIA in the last 168 hours.  Cardiac Enzymes: No results for input(s): CKTOTAL, CKMB, CKMBINDEX, TROPONINI in the last 168 hours. BNP (last 3 results) Recent Labs    05/04/19 1658 01/15/20 2228  BNP 1,562.1* 244.9*    ProBNP (last 3 results) No results for input(s): PROBNP in the last 8760 hours.  Studies:  No results found.   Admission status: The appropriate admission status for this patient is INPATIENT. Inpatient status is judged to be reasonable and necessary in order to provide the required intensity of service to ensure the patient's safety. The patient's presenting symptoms, physical exam findings, and initial  radiographic and laboratory data in the context of their chronic comorbidities is felt to place them at high risk for further clinical deterioration. Furthermore, it is not anticipated that the patient will be medically stable for discharge from the hospital within 2 midnights of admission. The following  factors support the admission status of inpatient.    The patient's presenting symptoms include limb pain The worrisome physical exam findings include critical limb ischemia, cellulitis The initial radiographic and laboratory data are worrisome because of critical limb ischemia seen on ABI vascular ultrasound The chronic co-morbidities include peripheral vascular disease    * I certify that at the point of admission it is my clinical judgment that the patient will require inpatient hospital care spanning beyond 2 midnights from the point of admission due to high intensity of service, high risk for further deterioration and high frequency of surveillance required.Oswald Hillock   [facility] If 7PM-7AM, please contact night-coverage at [email or web address], Office  [phone]   01/19/2020, 12:00 PM  LOS: 4 days

## 2020-01-20 LAB — CBC
HCT: 33.7 % — ABNORMAL LOW (ref 39.0–52.0)
Hemoglobin: 11 g/dL — ABNORMAL LOW (ref 13.0–17.0)
MCH: 29.9 pg (ref 26.0–34.0)
MCHC: 32.6 g/dL (ref 30.0–36.0)
MCV: 91.6 fL (ref 80.0–100.0)
Platelets: 223 10*3/uL (ref 150–400)
RBC: 3.68 MIL/uL — ABNORMAL LOW (ref 4.22–5.81)
RDW: 13.4 % (ref 11.5–15.5)
WBC: 9.6 10*3/uL (ref 4.0–10.5)
nRBC: 0 % (ref 0.0–0.2)

## 2020-01-20 LAB — GLUCOSE, CAPILLARY
Glucose-Capillary: 153 mg/dL — ABNORMAL HIGH (ref 70–99)
Glucose-Capillary: 206 mg/dL — ABNORMAL HIGH (ref 70–99)
Glucose-Capillary: 144 mg/dL — ABNORMAL HIGH (ref 70–99)
Glucose-Capillary: 193 mg/dL — ABNORMAL HIGH (ref 70–99)

## 2020-01-20 LAB — VANCOMYCIN, TROUGH: Vancomycin Tr: 8 ug/mL — ABNORMAL LOW (ref 15–20)

## 2020-01-20 LAB — HEPARIN LEVEL (UNFRACTIONATED): Heparin Unfractionated: 0.48 IU/mL (ref 0.30–0.70)

## 2020-01-20 MED ORDER — VANCOMYCIN HCL 750 MG/150ML IV SOLN
750.0000 mg | Freq: Two times a day (BID) | INTRAVENOUS | Status: DC
  Administered 2020-01-20 – 2020-01-21 (×2): 750 mg via INTRAVENOUS
  Filled 2020-01-20 (×3): qty 150

## 2020-01-20 NOTE — Progress Notes (Addendum)
Pharmacy Antibiotic Note  Robert Sanchez is a 64 y.o. male admitted on 01/15/2020 with left groin pain and redness.  Patient has a history of left fem-pop bypass 08/2018 due to critical ischemia.  Pharmacy has been consulted for vancomycin and Zosyn dosing for cellulitis.  SCr 1.38, CrCL 65 ml/min, afebrile, WBC 9.6. Vancomycin peak level was 28 with trough of 8. Levels were drawn appropriately. Calculated AUC is 385, which is slightly below goal of 400-550. Vital signs are stable.  Plan: Vancomycin Increase vancomycin to 750 mg IV Q 12 hrs. Goal AUC 400-550. Expected AUC: 462 Continue Zosyn EID 3.375gm IV Q8H Monitor renal fxn, clinical progress, vanc AUC as indicated F/u decision on ABX with VSS  Height: 5\' 9"  (175.3 cm) Weight: 217 lb 11.2 oz (98.7 kg) IBW/kg (Calculated) : 70.7  Temp (24hrs), Avg:97.9 F (36.6 C), Min:97.6 F (36.4 C), Max:98.6 F (37 C)  Recent Labs  Lab 01/15/20 0750 01/15/20 0750 01/15/20 1007 01/16/20 0244 01/16/20 1920 01/16/20 2229 01/17/20 0642 01/18/20 0300 01/19/20 0224 01/19/20 1609 01/20/20 0250 01/20/20 1038  WBC 12.5*   < >  --  11.7*   < > 10.7* 10.2 9.6 9.4  --  9.6  --   CREATININE 1.32*  --   --  1.20  --   --  1.38* 1.23 1.38*  --   --   --   LATICACIDVEN 2.1*  --  1.9  --   --   --   --   --   --   --   --   --   VANCOTROUGH  --   --   --   --   --   --   --   --   --   --   --  8*  VANCOPEAK  --   --   --   --   --   --   --   --   --  28*  --   --    < > = values in this interval not displayed.    Estimated Creatinine Clearance: 65.1 mL/min (A) (by C-G formula based on SCr of 1.38 mg/dL (H)).    Allergies  Allergen Reactions  . Lisinopril Other (See Comments)    Syncope  . Codeine Rash   Antimicrobials this admission:  Vanc 3/16 >> Zosyn 3/16 >>  Dose adjustments this admission:  3/21 Vancomycin 1250 q24 >> 750 q12  Microbiology results:  3/17 MRSA PCR: neg 3/16 COVID: negative  4/16, PharmD PGY1 Acute  Care Pharmacy Resident Phone [phone]  01/20/2020 12:20 PM

## 2020-01-20 NOTE — Progress Notes (Signed)
ANTICOAGULATION CONSULT NOTE - Follow Up Consult  Pharmacy Consult for heparin Indication: ischemic leg  Labs: Recent Labs    01/18/20 0300 01/18/20 1025 01/19/20 0224 01/19/20 0224 01/19/20 0921 01/19/20 1609 01/20/20 0250  HGB 11.6*  --  10.6*  --   --   --  11.0*  HCT 35.8*  --  32.8*  --   --   --  33.7*  PLT 247  --  238  --   --   --  223  HEPARINUNFRC 0.23*   < > 0.27*   < > 0.44 0.47 0.48  CREATININE 1.23  --  1.38*  --   --   --   --    < > = values in this interval not displayed.   . sodium chloride Stopped (01/16/20 0817)  . sodium chloride 10 mL/hr at 01/16/20 1038  . sodium chloride 100 mL/hr at 01/17/20 0900  . sodium chloride    . heparin 1,950 Units/hr (01/20/20 1218)  . piperacillin-tazobactam (ZOSYN)  IV 3.375 g (01/20/20 0910)  . vancomycin       Assessment: 64yo male s/p thrombolysis, mechanical thrombectomy, on IV Heparin.  Heparin level is therapeutic at 0.48 on heparin rate of 1950 units/hr.  No overt bleeding or complications per RN.  No known issues with heparin infusion. Hg stable at 11. Plts are wnls  Goal of Therapy:  Heparin level 0.3-0.7 units/ml   Plan:  1. Continue heparin infusion at 1950 units/hr 2. Watch for signs/symptoms of bleeding 3. Daily heparin level and CBC. Monitor Hg closely  4. F/u oral anticoagulation once able.  Alvia Grove, PharmD PGY1 Acute Care Pharmacy Resident Phone [phone]  01/20/2020 12:26 PM

## 2020-01-20 NOTE — Progress Notes (Signed)
Patient declines ambulation.  

## 2020-01-20 NOTE — Progress Notes (Signed)
[facility]  PROGRESS NOTE  Robert Sanchez NUU:[id number] DOB: [birth year] DOA: 01/15/2020 PCP: Forrest Moron, MD   Brief HPI:   64 year old male with history of CAD s/p CABG, PVD, s/p hypertension, hyperlipidemia, COPD, diabetes mellitus type 2 with neuropathy, CKD stage III, GERD presented with complaints of left foot pain and redness over the past 2 to 3 days.  He previously had left femoral to posterior tibial artery grafting performed in October 2019 by Dr. Carlis Abbott of vascular surgery.  Following bypass surgery he reported that his foot was getting good blood flow but he was never able to move the lateral 3 toes.  X-ray of the foot in the ED showed subcutaneous swelling, patient was started on empiric antibiotics, Zosyn.  Vascular surgery was consulted.    Subjective   Patient seen and examined, denies pain.   Assessment/Plan:     1. Cellulitis of left foot-improving, patient presented with redness and increased warmth of left foot.  No clear signs of injury noted.  X-ray imaging showed diffuse soft tissue edema concerning for cellulitis.  Patient was started on vancomycin and Zosyn.  We will continue with antibiotics at this time.  Day #5/7 2. Peripheral arterial disease/critical limb ischemia-patient has history of left femoral to posterior tibial bypass with reverse saphenous vein on 10/19.  Vascular surgery was consulted.  Vascular ultrasound with ABI showed severe left lower extremity arterial disease, patient underwent aortogram, left lower extremity arteriogram with placement of thrombolytic catheter in the left lower extremity bypass with initiation of thrombolysis.  Patient received IV alteplase, IV heparin.  Underwent mechanical thrombectomy yesterday and also thrombolysis catheter check for occluded left lower extremity bypass.  Continue with IV heparin for now.  As per vascular surgery patient will transition to oral anticoagulation in the next few days.   3. Diabetes  mellitus type 2-patient is glipizide and Metformin at home.  Oral medications are currently on hold.  Continue sliding scale insulin with NovoLog.  CBG well controlled.   4. CKD stage III-patient's creatinine is 1.38, back to baseline. Follow BMP in a.m. 5. Acute on chronic systolic CHF exacerbation-chest x-ray was concerning for cardiomegaly with mild interstitial edema.  He received 1 dose of Lasix 40 mg IV.  At this time patient denies shortness of breath, O2 sats 97% on room air.  We will continue to monitor.    SpO2: 95 % O2 Flow Rate (L/min): 2 L/min   COVID-19 Labs  No results for input(s): DDIMER, FERRITIN, LDH, CRP in the last 72 hours.  Lab Results  Component Value Date   SARSCOV2NAA NEGATIVE 01/15/2020   [place] NEGATIVE 05/04/2019   [place] Not Detected 05/01/2019     CBG: Recent Labs  Lab 01/19/20 1113 01/19/20 1556 01/19/20 2121 01/20/20 0610 01/20/20 1134  GLUCAP 191* 181* 157* 153* 206*    CBC: Recent Labs  Lab 01/15/20 0750 01/16/20 0244 01/16/20 2229 01/17/20 0642 01/18/20 0300 01/19/20 0224 01/20/20 0250  WBC 12.5*   < > 10.7* 10.2 9.6 9.4 9.6  NEUTROABS 9.4*  --   --   --   --   --   --   HGB 13.4   < > 12.7* 12.2* 11.6* 10.6* 11.0*  HCT 41.7   < > 38.8* 37.1* 35.8* 32.8* 33.7*  MCV 92.3   < > 92.2 91.6 92.7 92.1 91.6  PLT 314   < > 273 271 247 238 223   < > = values in this interval not displayed.  Basic Metabolic Panel: Recent Labs  Lab 01/15/20 0750 01/16/20 0244 01/17/20 0642 01/18/20 0300 01/19/20 0224  NA 139 136 138 138 135  K 4.5 4.3 4.3 4.0 4.1  CL 105 101 104 106 105  CO2 22 20* 22 21* 19*  GLUCOSE 234* 174* 165* 165* 153*  BUN 23 26* 20 16 17   CREATININE 1.32* 1.20 1.38* 1.23 1.38*  CALCIUM 9.3 9.0 8.8* 8.8* 8.6*     Liver Function Tests: Recent Labs  Lab 01/15/20 0750 01/18/20 0300  AST 13* 11*  ALT 16 11  ALKPHOS 68 44  BILITOT 0.8 1.2  PROT 7.9 6.8  ALBUMIN 3.7 3.0*        DVT prophylaxis:  Heparin  Code Status: Full code  Family Communication: No family at bedside  Disposition Plan: Patient is on IV heparin, and IV antibiotics for cellulitis.-Barrier to discharge-continues on IV heparin, will transition to oral anticoagulation next 2 to 3 days.  Also on IV antibiotics for cellulitis.         Scheduled medications:  . aspirin  325 mg Oral QODAY  . atorvastatin  40 mg Oral QHS  . carvedilol  6.25 mg Oral BID  . Chlorhexidine Gluconate Cloth  6 each Topical Q0600  . insulin aspart  0-15 Units Subcutaneous TID WC  . insulin aspart  0-5 Units Subcutaneous QHS  . mupirocin ointment  1 application Nasal BID  . sodium chloride flush  3 mL Intravenous Q12H    Consultants:  Vascular surgery  Procedures:    Antibiotics:   Anti-infectives (From admission, onward)   Start     Dose/Rate Route Frequency Ordered Stop   01/20/20 1224  vancomycin (VANCOREADY) IVPB 750 mg/150 mL     750 mg 150 mL/hr over 60 Minutes Intravenous Every 12 hours 01/20/20 1224     01/17/20 1200  vancomycin (VANCOREADY) IVPB 1250 mg/250 mL  Status:  Discontinued     1,250 mg 166.7 mL/hr over 90 Minutes Intravenous Every 24 hours 01/16/20 1834 01/20/20 1224   01/16/20 2200  piperacillin-tazobactam (ZOSYN) IVPB 3.375 g     3.375 g 12.5 mL/hr over 240 Minutes Intravenous Every 8 hours 01/16/20 1834     01/16/20 1300  vancomycin (VANCOREADY) IVPB 1250 mg/250 mL  Status:  Discontinued     1,250 mg 166.7 mL/hr over 90 Minutes Intravenous Every 24 hours 01/15/20 1212 01/16/20 1834   01/15/20 1700  piperacillin-tazobactam (ZOSYN) IVPB 3.375 g  Status:  Discontinued     3.375 g 12.5 mL/hr over 240 Minutes Intravenous Every 8 hours 01/15/20 1212 01/16/20 1834   01/15/20 1215  vancomycin (VANCOCIN) IVPB 1000 mg/200 mL premix  Status:  Discontinued     1,000 mg 200 mL/hr over 60 Minutes Intravenous  Once 01/15/20 1202 01/15/20 1208   01/15/20 1215  vancomycin (VANCOREADY) IVPB 2000 mg/400 mL      2,000 mg 200 mL/hr over 120 Minutes Intravenous  Once 01/15/20 1208 01/15/20 1823   01/15/20 0915  piperacillin-tazobactam (ZOSYN) IVPB 3.375 g     3.375 g 100 mL/hr over 30 Minutes Intravenous  Once 01/15/20 0905 01/15/20 0952       Objective   Vitals:   01/19/20 2349 01/20/20 0406 01/20/20 0735 01/20/20 1345  BP: 123/68 (!) 115/53 133/81 128/67  Pulse: 71 71 73 93  Resp: 20 16 15 12   Temp: 97.9 F (36.6 C) 97.6 F (36.4 C) 97.7 F (36.5 C) 97.7 F (36.5 C)  TempSrc: Oral Oral Oral Oral  SpO2:  99% 96%  95%  Weight:  98.7 kg    Height:        Intake/Output Summary (Last 24 hours) at 01/20/2020 1405 Last data filed at 01/20/2020 0912 Gross per 24 hour  Intake 1250.32 ml  Output 2275 ml  Net -1024.68 ml    03/19 1901 - 03/21 0700 In: 1317 [I.V.:875] Out: 2620 [Urine:2620]  Filed Weights   01/17/20 0500 01/19/20 0635 01/20/20 0406  Weight: 98 kg 99 kg 98.7 kg    Physical Examination:  General-appears in no acute distress Heart-S1-S2, regular, no murmur auscultated Lungs-clear to auscultation bilaterally, no wheezing or crackles auscultated Abdomen-soft, nontender, no organomegaly Extremities-trace edema of left foot, mild erythema, left big toe cyanotic. Neuro-alert, oriented x3, no focal deficit noted  Data Reviewed:   Recent Results (from the past 240 hour(s))  SARS CORONAVIRUS 2 (TAT 6-24 HRS) Nasopharyngeal Nasopharyngeal Swab     Status: None   Collection Time: 01/15/20  9:44 AM   Specimen: Nasopharyngeal Swab  Result Value Ref Range Status   SARS Coronavirus 2 NEGATIVE NEGATIVE Final    Comment: (NOTE) SARS-CoV-2 target nucleic acids are NOT DETECTED. The SARS-CoV-2 RNA is generally detectable in upper and lower respiratory specimens during the acute phase of infection. Negative results do not preclude SARS-CoV-2 infection, do not rule out co-infections with other pathogens, and should not be used as the sole basis for treatment or other patient  management decisions. Negative results must be combined with clinical observations, patient history, and epidemiological information. The expected result is Negative. Fact Sheet for Patients: HairSlick.no Fact Sheet for Healthcare Providers: quierodirigir.com This test is not yet approved or cleared by the [place] FDA and  has been authorized for detection and/or diagnosis of SARS-CoV-2 by FDA under an Emergency Use Authorization (EUA). This EUA will remain  in effect (meaning this test can be used) for the duration of the COVID-19 declaration under Section 56 4(b)(1) of the Act, 21 U.S.C. section 360bbb-3(b)(1), unless the authorization is terminated or revoked sooner. Performed at [facility], [address], [place], [place] [address]   MRSA PCR Screening     Status: None   Collection Time: 01/16/20  6:30 PM   Specimen: Nasal Mucosa; Nasopharyngeal  Result Value Ref Range Status   MRSA by PCR NEGATIVE NEGATIVE Final    Comment:        The GeneXpert MRSA Assay (FDA approved for NASAL specimens only), is one component of a comprehensive MRSA colonization surveillance program. It is not intended to diagnose MRSA infection nor to guide or monitor treatment for MRSA infections. Performed at [facility], [address] [place], [place] [address]     No results for input(s): LIPASE, AMYLASE in the last 168 hours. No results for input(s): AMMONIA in the last 168 hours.  Cardiac Enzymes: No results for input(s): CKTOTAL, CKMB, CKMBINDEX, TROPONINI in the last 168 hours. BNP (last 3 results) Recent Labs    05/04/19 1658 01/15/20 2228  BNP 1,562.1* 244.9*    ProBNP (last 3 results) No results for input(s): PROBNP in the last 8760 hours.  Studies:  No results found.   Admission status: The appropriate admission status for this patient is INPATIENT. Inpatient status is judged to be reasonable and  necessary in order to provide the required intensity of service to ensure the patient's safety. The patient's presenting symptoms, physical exam findings, and initial radiographic and laboratory data in the context of their chronic comorbidities is felt  to place them at high risk for further clinical deterioration. Furthermore, it is not anticipated that the patient will be medically stable for discharge from the hospital within 2 midnights of admission. The following factors support the admission status of inpatient.    The patient's presenting symptoms include limb pain The worrisome physical exam findings include critical limb ischemia, cellulitis The initial radiographic and laboratory data are worrisome because of critical limb ischemia seen on ABI vascular ultrasound The chronic co-morbidities include peripheral vascular disease    * I certify that at the point of admission it is my clinical judgment that the patient will require inpatient hospital care spanning beyond 2 midnights from the point of admission due to high intensity of service, high risk for further deterioration and high frequency of surveillance required.Meredeth Ide   [facility] If 7PM-7AM, please contact night-coverage at [email or web address], Office  [phone]   01/20/2020, 2:05 PM  LOS: 5 days

## 2020-01-20 NOTE — Progress Notes (Addendum)
  Progress Note    01/20/2020 9:48 AM 3 Days Post-Op  Subjective:  No complaints; says his left foot continues to feel better  Afebrile HR 70's-80's NSR 110's-130's systolic 98% RA  Vitals:   01/20/20 0406 01/20/20 0735  BP: (!) 115/53 133/81  Pulse: 71 73  Resp: 16 15  Temp: 97.6 F (36.4 C) 97.7 F (36.5 C)  SpO2: 96%     Physical Exam: Cardiac:  regular Lungs:  Non labored Extremities:  Left foot is warm, erythema improving; left great toe with cyanosis   CBC    Component Value Date/Time   WBC 9.6 01/20/2020 0250   RBC 3.68 (L) 01/20/2020 0250   HGB 11.0 (L) 01/20/2020 0250   HCT 33.7 (L) 01/20/2020 0250   PLT 223 01/20/2020 0250   MCV 91.6 01/20/2020 0250   MCH 29.9 01/20/2020 0250   MCHC 32.6 01/20/2020 0250   RDW 13.4 01/20/2020 0250   LYMPHSABS 1.5 01/15/2020 0750   MONOABS 1.2 (H) 01/15/2020 0750   EOSABS 0.2 01/15/2020 0750   BASOSABS 0.1 01/15/2020 0750    BMET    Component Value Date/Time   NA 135 01/19/2020 0224   NA 136 09/14/2019 1701   K 4.1 01/19/2020 0224   CL 105 01/19/2020 0224   CO2 19 (L) 01/19/2020 0224   GLUCOSE 153 (H) 01/19/2020 0224   BUN 17 01/19/2020 0224   BUN 20 09/14/2019 1701   CREATININE 1.38 (H) 01/19/2020 0224   CREATININE 0.81 08/14/2014 1126   CALCIUM 8.6 (L) 01/19/2020 0224   GFRNONAA 55 (L) 01/19/2020 0224   GFRNONAA >89 08/14/2014 1126   GFRAA >60 01/19/2020 0224   GFRAA >89 08/14/2014 1126    INR    Component Value Date/Time   INR 1.2 01/16/2020 0244     Intake/Output Summary (Last 24 hours) at 01/20/2020 0948 Last data filed at 01/20/2020 0912 Gross per 24 hour  Intake 1250.32 ml  Output 2800 ml  Net -1549.68 ml     Assessment:  64 y.o. male is s/p:  1.Thrombolytics catheter check of the left lower extremity including left lower extremity arteriogram with selection of third order branches 2.Left common femoral to posterior tibial artery vein bypass angioplasty (4 mm x 220 mm Sterling and  5 mm x 220 mm Sterling) 3.Percutaneous mechanical thrombectomy of the left common femoral to posterior tibial artery bypass (CAT6 penumbra percutaneous mechanical thrombectomy) 4.Left posterior tibial artery angioplasty (3 mm x 150 mm [place]) 5.Mynx closure of the right common femoral artery   3 Days Post-Op  Plan: -pt continues to have brisk left PT/peroneal doppler signals -erythema resolving and left great toe will need to demarcate -DVT prophylaxis:  Continue heparin gtt will in hospital but will need oral AC at discharge. -mobilize   Doreatha Massed, PA-C [facility] [phone] 01/20/2020 9:48 AM  Foot warm doppler signals  Cellulitis improving still some foot edema  Dr Chestine Spore to decide on antibiotics and anticoagulation tomorrow  Fabienne Bruns, MD [facility] [place] Office: [phone]

## 2020-01-21 LAB — CBC
HCT: 33.2 % — ABNORMAL LOW (ref 39.0–52.0)
Hemoglobin: 10.7 g/dL — ABNORMAL LOW (ref 13.0–17.0)
MCH: 30.3 pg (ref 26.0–34.0)
MCHC: 32.2 g/dL (ref 30.0–36.0)
MCV: 94.1 fL (ref 80.0–100.0)
Platelets: 205 10*3/uL (ref 150–400)
RBC: 3.53 MIL/uL — ABNORMAL LOW (ref 4.22–5.81)
RDW: 13.8 % (ref 11.5–15.5)
WBC: 9.8 10*3/uL (ref 4.0–10.5)
nRBC: 0 % (ref 0.0–0.2)

## 2020-01-21 LAB — HEPARIN LEVEL (UNFRACTIONATED): Heparin Unfractionated: 0.37 IU/mL (ref 0.30–0.70)

## 2020-01-21 LAB — GLUCOSE, CAPILLARY: Glucose-Capillary: 142 mg/dL — ABNORMAL HIGH (ref 70–99)

## 2020-01-21 MED ORDER — APIXABAN 5 MG PO TABS: 5.0000 mg | ORAL_TABLET | Freq: Two times a day (BID) | ORAL | 2 refills | Status: DC

## 2020-01-21 MED ORDER — CLOPIDOGREL BISULFATE 75 MG PO TABS
75.0000 mg | ORAL_TABLET | Freq: Every day | ORAL | Status: DC
  Administered 2020-01-21: 13:00:00 75 mg via ORAL
  Filled 2020-01-21: qty 1

## 2020-01-21 MED ORDER — CLOPIDOGREL BISULFATE 75 MG PO TABS: 75.0000 mg | ORAL_TABLET | Freq: Every day | ORAL | 11 refills | Status: DC

## 2020-01-21 MED ORDER — OXYCODONE-ACETAMINOPHEN 5-325 MG PO TABS: 1.0000 | ORAL_TABLET | Freq: Four times a day (QID) | ORAL | 0 refills | Status: DC | PRN

## 2020-01-21 MED ORDER — APIXABAN 5 MG PO TABS
5.0000 mg | ORAL_TABLET | Freq: Two times a day (BID) | ORAL | Status: DC
  Administered 2020-01-21: 13:00:00 5 mg via ORAL
  Filled 2020-01-21: qty 1

## 2020-01-21 MED FILL — CLOPIDOGREL 75 MG TABLET: 75 | 30 days supply | Qty: 30 | Fill #0

## 2020-01-21 MED FILL — OXYCODONE-APAP 5-325MG: 5-325 | 5 days supply | Qty: 20 | Fill #0

## 2020-01-21 MED FILL — ELIQUIS 5 MG TABLET: 5 | 30 days supply | Qty: 60 | Fill #0

## 2020-01-21 NOTE — Progress Notes (Signed)
01/21/2020 2:06 PM Discharge AVS meds taken today and those due this evening reviewed.  Follow-up appointments and when to call md reviewed.  D/C IV and TELE.  Questions and concerns addressed.   D/C home per orders. Kathryne Hitch

## 2020-01-21 NOTE — TOC Transition Note (Signed)
Transition of Care [facility]) - CM/SW Discharge Note Donn Pierini RN, BSN Transitions of Care Unit 4E- RN Case Manager [phone]   Patient Details  Name: Robert Sanchez MRN: [id number] Date of Birth: February 18, 1956  Transition of Care [facility]) CM/SW Contact:  Darrold Span, RN Phone Number: 01/21/2020, 12:41 PM   Clinical Narrative:    Per MD plan to change pt to Eliquis and then d/c home today. Pt has PCP listed has Robert Sanchez. CM spoke with pt at bedside to discuss PCP and Eliquis needs. Per pt he confirmed PCP as Robert Sanchez at [facility]- pt reports that he has been going there for years and would like to stay with [name]- as he is established there. Pt provided the pt assistance application for Eliquis and explained he will need to apply for pt assistance for continued assistance with the drug- pt understands he will not be able to afford drug without assistance. Pt to f/u with his PCP and the application. TOC pharmacy to fill first month on discharge using 30 day free drug card.    Final next level of care: Home/Self Care Barriers to Discharge: No Barriers Identified   Patient Goals and CMS Choice Patient states their goals for this hospitalization and ongoing recovery are:: return home   Choice offered to / list presented to : NA  Discharge Placement               Home        Discharge Plan and Services   Discharge Planning Services: CM Consult, Medication Assistance            DME Arranged: N/A DME Agency: NA       HH Arranged: NA HH Agency: NA        Social Determinants of Health (SDOH) Interventions     Readmission Risk Interventions Readmission Risk Prevention Plan 01/21/2020  Transportation Screening Complete  PCP or Specialist Appt within 5-7 Days Complete  Home Care Screening Complete  Medication Review (RN CM) Complete  Some recent data might be hidden

## 2020-01-21 NOTE — Discharge Instructions (Signed)
Hemoglobin A1c Test Why am I having this test? You may have the hemoglobin A1c test (HbA1c test) done to:  Evaluate your risk for developing diabetes (diabetes mellitus).  Diagnose diabetes.  Monitor long-term control of blood sugar (glucose) in people who have diabetes and help make treatment decisions. This test may be done with other blood glucose tests, such as fasting blood glucose and oral glucose tolerance tests. What is being tested? Hemoglobin is a type of protein in the blood that carries oxygen. Glucose attaches to hemoglobin to form glycated hemoglobin. This test checks the amount of glycated hemoglobin in your blood, which is a good indicator of the average amount of glucose in your blood during the past 2-3 months. What kind of sample is taken?  A blood sample is required for this test. It is usually collected by inserting a needle into a blood vessel. Tell a health care provider about:  All medicines you are taking, including vitamins, herbs, eye drops, creams, and over-the-counter medicines.  Any blood disorders you have.  Any surgeries you have had.  Any medical conditions you have.  Whether you are pregnant or may be pregnant. How are the results reported? Your results will be reported as a percentage that indicates how much of your hemoglobin has glucose attached to it (is glycated). Your health care provider will compare your results to normal ranges that were established after testing a large group of people (reference ranges). Reference ranges may vary among labs and hospitals. For this test, common reference ranges are:  Adult or child without diabetes: 4-5.6%.  Adult or child with diabetes and good blood glucose control: less than 7%. What do the results mean? If you have diabetes:  A result of less than 7% is considered normal, meaning that your blood glucose is well controlled.  A result higher than 7% means that your blood glucose is not well  controlled, and your treatment plan may need to be adjusted. If you do not have diabetes:  A result within the reference range is considered normal, meaning that you are not at high risk for diabetes.  A result of 5.7-6.4% means that you have a high risk of developing diabetes, and you may have prediabetes. Prediabetes is the condition of having a blood glucose level that is higher than it should be, but not high enough for you to be diagnosed with diabetes. Having prediabetes puts you at risk for developing type 2 diabetes (type 2 diabetes mellitus). You may have more tests, including a repeat HbA1c test.  Results of 6.5% or higher on two separate HbA1c tests mean that you have diabetes. You may have more tests to confirm the diagnosis. Abnormally low HbA1c values may be caused by:  Pregnancy.  Severe blood loss.  Receiving donated blood (transfusions).  Low red blood cell count (anemia).  Long-term kidney failure.  Some unusual forms (variants) of hemoglobin. Talk with your health care provider about what your results mean. Questions to ask your health care provider Ask your health care provider, or the department that is doing the test:  When will my results be ready?  How will I get my results?  What are my treatment options?  What other tests do I need?  What are my next steps? Summary  The hemoglobin A1c test (HbA1c test) may be done to evaluate your risk for developing diabetes, to diagnose diabetes, and to monitor long-term control of blood sugar (glucose) in people who have  diabetes and help make treatment decisions.  Hemoglobin is a type of protein in the blood that carries oxygen. Glucose attaches to hemoglobin to form glycated hemoglobin. This test checks the amount of glycated hemoglobin in your blood, which is a good indicator of the average amount of glucose in your blood during the past 2-3 months.  Talk with your health care provider about what your results  mean. This information is not intended to replace advice given to you by your health care provider. Make sure you discuss any questions you have with your health care provider. Document Revised: 09/30/2017 Document Reviewed: 05/31/2017 Elsevier Patient Education  [place]. Insulin Injection Instructions, Single Insulin Dose, Adult A subcutaneous injection is a shot of medicine that is injected into the layer of fat and tissue between skin and muscle. People with type 1 diabetes must take insulin because their bodies do not make it. People with type 2 diabetes may need to take insulin.  There are many different types of insulin. The type of insulin that you take may determine how many injections you give yourself and when you need to give the injections. Supplies needed:  Soap and water to wash hands.  A new, unused insulin syringe.  Your insulin medication bottle (vial).  Alcohol wipes.  A disposal container that is meant for sharp items (sharps container), such as an empty plastic bottle with a cover. How to choose a site for injection The body absorbs insulin differently, depending on where the insulin is injected (injection site). It is best to inject insulin into the same body area each time (for example, always in the abdomen), but you should use a different spot in that area for each injection. Do not inject the insulin in the same spot each time. There are five main areas that can be used for injecting. These areas include:  Abdomen. This is the preferred area.  Front of thigh.  Upper, outer side of thigh.  Upper, outer side of arm.  Upper, outer part of buttock. How to give a single-dose insulin injection First, follow the steps for Get ready, then continue with the steps for Push air into the vial, then follow the steps for Fill the syringe, and finish with the steps for Inject the insulin. Get ready 1. Wash your hands with soap and water. If soap and water are not  available, use hand sanitizer. 2. Before you give yourself an insulin injection, be sure to test your blood sugar level (blood glucose level) and write down that number. Follow any instructions from your health care provider about what to do if your blood glucose level is higher or lower than your normal range. 3. Use a new, unused insulin syringe each time you need to inject insulin. 4. Check to make sure you have the correct type of insulin syringe for the concentration of insulin that you are using. 5. Check the expiration date and the type of insulin that you are using. 6. If you are using CLEAR insulin, check to see that it is clear and free of clumps. 7. Do not shake the vial to get it ready. Gently roll the vial between your palms several times. 8. Remove the plastic pop-top covering from the vial of insulin. This type of covering is present on a vial when it is new. 9. Use an alcohol wipe to clean the rubber top of the vial. 10. Remove the plastic cover from the syringe needle. Do not let the needle touch  anything. Push air into the vial 1. To bring (draw up) air into the syringe, slowly pull back on the syringe plunger. Stop pulling the plunger when the dose indicator gets to the number of units that you will be using. 2. While you keep the vial right-side-up, poke the needle through the rubber top of the vial. Do not turn the vial upside down to do this. 3. Push the plunger all the way into the syringe. Doing that will push air into the vial. 4. Do not take the needle out of the vial yet. Fill the syringe  1. While the needle is still in the vial, turn the vial upside down and hold it at eye level. 2. Slowly pull back on the plunger. Stop pulling the plunger when the dose indicator gets to the desired number of units. 3. If you see air bubbles in the syringe, slowly move the plunger up and down 2 or 3 times to make them go away. ? If you had to move the plunger to get rid of air  bubbles, pull back the plunger until the dose indicator returns to the correct dose. 4. Remove the needle from the vial. Do not let the needle touch anything. Inject the insulin  1. Use an alcohol wipe to clean the site where you will be injecting the needle. Let the site air-dry. 2. Hold the syringe in your writing hand like a pencil. 3. Use your other hand to pinch and hold about an inch (2.5 cm) of skin. Do not directly touch the cleaned part of the skin. 4. Gently but quickly, put the needle straight into the skin. The needle should be at a 90-degree angle (perpendicular) to the skin. 5. Push the needle in as far as it will go (to the hub). 6. When the needle is completely inserted into the skin, use your thumb or index finger of your writing hand to push the plunger all the way into the syringe to inject the insulin. 7. Let go of the skin that you are pinching. Continue to hold the syringe in place with your writing hand. 8. Wait 10 seconds, then pull the needle straight out of the skin. This will allow all of the insulin to go from the syringe and needle into your body. 9. Press and hold the alcohol wipe over the injection site until any bleeding stops. Do not rub the area. 10. Do not put the plastic cover back on the needle. 11. Discard the syringe and needle directly into a sharps container, such as an empty plastic bottle with a cover. How to throw away supplies  Discard all used needles in a puncture-proof sharps disposal container. You can ask your local pharmacy about where you can get this kind of disposal container, or you can use an empty plastic liquid laundry detergent bottle that has a cover.  Follow the disposal regulations for the area where you live. Do not use any syringe or needle more than one time.  Throw away empty vials in the regular trash. Questions to ask your health care provider  How often should I be taking insulin?  How often should I check my blood  glucose?  What amount of insulin should I be taking at each time?  What are the side effects?  What should I do if my blood glucose is too high?  What should I do if my blood glucose is too low?  What should I do if I forget to take my  insulin?  What number should I call if I have questions? Where to find more information  American Diabetes Association (ADA): www.diabetes.org  American Association of Diabetes Educators (AADE) Patient Resources: https://www.diabeteseducator.org Summary  A subcutaneous injection is a shot of medicine that is injected into the layer of fat and tissue between skin and muscle.  Before you give yourself an insulin injection, be sure to test your blood sugar level (blood glucose level) and write down that number.  The type of insulin that you take may determine how many injections you give yourself and when you need to give the injections.  Check the expiration date and the type of insulin that you are using.  It is best to inject insulin into the same body area each time (for example, always in the abdomen), but you should use a different spot in that area for each injection. This information is not intended to replace advice given to you by your health care provider. Make sure you discuss any questions you have with your health care provider. Document Revised: 10/21/2017 Document Reviewed: 11/21/2015 Elsevier Patient Education  [place]. Hypoglycemia Hypoglycemia is when the sugar (glucose) level in your blood is too low. Signs of low blood sugar may include:  Feeling: ? Hungry. ? Worried or nervous (anxious). ? Sweaty and clammy. ? Confused. ? Dizzy. ? Sleepy. ? Sick to your stomach (nauseous).  Having: ? A fast heartbeat. ? A headache. ? A change in your vision. ? Tingling or no feeling (numbness) around your mouth, lips, or tongue. ? Jerky movements that you cannot control (seizure).  Having trouble with: ? Moving  (coordination). ? Sleeping. ? Passing out (fainting). ? Getting upset easily (irritability). Low blood sugar can happen to people who have diabetes and people who do not have diabetes. Low blood sugar can happen quickly, and it can be an emergency. Treating low blood sugar Low blood sugar is often treated by eating or drinking something sugary right away, such as:  Fruit juice, 4-6 oz (120-150 mL).  Regular soda (not diet soda), 4-6 oz (120-150 mL).  Low-fat milk, 4 oz (120 mL).  Several pieces of hard candy.  Sugar or honey, 1 Tbsp (15 mL). Treating low blood sugar if you have diabetes If you can think clearly and swallow safely, follow the 15:15 rule:  Take 15 grams of a fast-acting carb (carbohydrate). Talk with your doctor about how much you should take.  Always keep a source of fast-acting carb with you, such as: ? Sugar tablets (glucose pills). Take 3-4 pills. ? 6-8 pieces of hard candy. ? 4-6 oz (120-150 mL) of fruit juice. ? 4-6 oz (120-150 mL) of regular (not diet) soda. ? 1 Tbsp (15 mL) honey or sugar.  Check your blood sugar 15 minutes after you take the carb.  If your blood sugar is still at or below 70 mg/dL (3.9 mmol/L), take 15 grams of a carb again.  If your blood sugar does not go above 70 mg/dL (3.9 mmol/L) after 3 tries, get help right away.  After your blood sugar goes back to normal, eat a meal or a snack within 1 hour.  Treating very low blood sugar If your blood sugar is at or below 54 mg/dL (3 mmol/L), you have very low blood sugar (severe hypoglycemia). This may also cause:  Passing out.  Jerky movements you cannot control (seizure).  Losing consciousness (coma). This is an emergency. Do not wait to see if the symptoms will go  away. Get medical help right away. Call your local emergency services (911 in the [place] Do not drive yourself to the hospital. If you have very low blood sugar and you cannot eat or drink, you may need a glucagon shot  (injection). A family member or friend should learn how to check your blood sugar and how to give you a glucagon shot. Ask your doctor if you need to have a glucagon shot kit at home. Follow these instructions at home: General instructions  Take over-the-counter and prescription medicines only as told by your doctor.  Stay aware of your blood sugar as told by your doctor.  Limit alcohol intake to no more than 1 drink a day for nonpregnant women and 2 drinks a day for men. One drink equals 12 oz of beer (355 mL), 5 oz of wine (148 mL), or 1 oz of hard liquor (44 mL).  Keep all follow-up visits as told by your doctor. This is important. If you have diabetes:   Follow your diabetes care plan as told by your doctor. Make sure you: ? Know the signs of low blood sugar. ? Take your medicines as told. ? Follow your exercise and meal plan. ? Eat on time. Do not skip meals. ? Check your blood sugar as often as told by your doctor. Always check it before and after exercise. ? Follow your sick day plan when you cannot eat or drink normally. Make this plan ahead of time with your doctor.  Share your diabetes care plan with: ? Your work or school. ? People you live with.  Check your pee (urine) for ketones: ? When you are sick. ? As told by your doctor.  Carry a card or wear jewelry that says you have diabetes. Contact a doctor if:  You have trouble keeping your blood sugar in your target range.  You have low blood sugar often. Get help right away if:  You still have symptoms after you eat or drink something sugary.  Your blood sugar is at or below 54 mg/dL (3 mmol/L).  You have jerky movements that you cannot control.  You pass out. These symptoms may be an emergency. Do not wait to see if the symptoms will go away. Get medical help right away. Call your local emergency services (911 in the [place] Do not drive yourself to the hospital. Summary  Hypoglycemia happens when the level  of sugar (glucose) in your blood is too low.  Low blood sugar can happen to people who have diabetes and people who do not have diabetes. Low blood sugar can happen quickly, and it can be an emergency.  Make sure you know the signs of low blood sugar and know how to treat it.  Always keep a source of sugar (fast-acting carb) with you to treat low blood sugar. This information is not intended to replace advice given to you by your health care provider. Make sure you discuss any questions you have with your health care provider. Document Revised: 02/08/2019 Document Reviewed: 11/21/2015 Elsevier Patient Education  [place]. Hyperglycemia Hyperglycemia occurs when the level of sugar (glucose) in the blood is too high. Glucose is a type of sugar that provides the body's main source of energy. Certain hormones (insulin and glucagon) control the level of glucose in the blood. Insulin lowers blood glucose, and glucagon increases blood glucose. Hyperglycemia can result from having too little insulin in the bloodstream, or from the body not responding normally to insulin. Hyperglycemia  occurs most often in people who have diabetes (diabetes mellitus), but it can happen in people who do not have diabetes. It can develop quickly, and it can be life-threatening if it causes you to become severely dehydrated (diabetic ketoacidosis or hyperglycemic hyperosmolar state). Severe hyperglycemia is a medical emergency. What are the causes? If you have diabetes, hyperglycemia may be caused by:  Diabetes medicine.  Medicines that increase blood glucose or affect your diabetes control.  Not eating enough, or not eating often enough.  Changes in physical activity level.  Being sick or having an infection. If you have prediabetes or undiagnosed diabetes:  Hyperglycemia may be caused by those conditions. If you do not have diabetes, hyperglycemia may be caused by:  Certain medicines, including steroid  medicines, beta-blockers, epinephrine, and thiazide diuretics.  Stress.  Serious illness.  Surgery.  Diseases of the pancreas.  Infection. What increases the risk? Hyperglycemia is more likely to develop in people who have risk factors for diabetes, such as:  Having a family member with diabetes.  Having a gene for type 1 diabetes that is passed from parent to child (inherited).  Living in an area with cold weather conditions.  Exposure to certain viruses.  Certain conditions in which the body's disease-fighting (immune) system attacks itself (autoimmune disorders).  Being overweight or obese.  Having an inactive (sedentary) lifestyle.  Having been diagnosed with insulin resistance.  Having a history of prediabetes, gestational diabetes, or polycystic ovarian syndrome (PCOS).  Being of American-Indian, African-American, Hispanic/Latino, or Asian/Pacific Islander descent. What are the signs or symptoms? Hyperglycemia may not cause any symptoms. If you do have symptoms, they may include early warning signs, such as:  Increased thirst.  Hunger.  Feeling very tired.  Needing to urinate more often than usual.  Blurry vision. Other symptoms may develop if hyperglycemia gets worse, such as:  Dry mouth.  Loss of appetite.  Fruity-smelling breath.  Weakness.  Unexpected or rapid weight gain or weight loss.  Tingling or numbness in the hands or feet.  Headache.  Skin that does not quickly return to normal after being lightly pinched and released (poor skin turgor).  Abdominal pain.  Cuts or bruises that are slow to heal. How is this diagnosed? Hyperglycemia is diagnosed with a blood test to measure your blood glucose level. This blood test is usually done while you are having symptoms. Your health care provider may also do a physical exam and review your medical history. You may have more tests to determine the cause of your hyperglycemia, such as:  A  fasting blood glucose (FBG) test. You will not be allowed to eat (you will fast) for at least 8 hours before a blood sample is taken.  An A1c (hemoglobin A1c) blood test. This provides information about blood glucose control over the previous 2-3 months.  An oral glucose tolerance test (OGTT). This measures your blood glucose at two times: ? After fasting. This is your baseline blood glucose level. ? Two hours after drinking a beverage that contains glucose. How is this treated? Treatment depends on the cause of your hyperglycemia. Treatment may include:  Taking medicine to regulate your blood glucose levels. If you take insulin or other diabetes medicines, your medicine or dosage may be adjusted.  Lifestyle changes, such as exercising more, eating healthier foods, or losing weight.  Treating an illness or infection, if this caused your hyperglycemia.  Checking your blood glucose more often.  Stopping or reducing steroid medicines, if these caused your  hyperglycemia. If your hyperglycemia becomes severe and it results in hyperglycemic hyperosmolar state, you must be hospitalized and given IV fluids. Follow these instructions at home:  General instructions  Take over-the-counter and prescription medicines only as told by your health care provider.  Do not use any products that contain nicotine or tobacco, such as cigarettes and e-cigarettes. If you need help quitting, ask your health care provider.  Limit alcohol intake to no more than 1 drink per day for nonpregnant women and 2 drinks per day for men. One drink equals 12 oz of beer, 5 oz of wine, or 1 oz of hard liquor.  Learn to manage stress. If you need help with this, ask your health care provider.  Keep all follow-up visits as told by your health care provider. This is important. Eating and drinking   Maintain a healthy weight.  Exercise regularly, as directed by your health care provider.  Stay hydrated, especially when  you exercise, get sick, or spend time in hot temperatures.  Eat healthy foods, such as: ? Lean proteins. ? Complex carbohydrates. ? Fresh fruits and vegetables. ? Low-fat dairy products. ? Healthy fats.  Drink enough fluid to keep your urine clear or pale yellow. If you have diabetes:  Make sure you know the symptoms of hyperglycemia.  Follow your diabetes management plan, as told by your health care provider. Make sure you: ? Take your insulin and medicines as directed. ? Follow your exercise plan. ? Follow your meal plan. Eat on time, and do not skip meals. ? Check your blood glucose as often as directed. Make sure to check your blood glucose before and after exercise. If you exercise longer or in a different way than usual, check your blood glucose more often. ? Follow your sick day plan whenever you cannot eat or drink normally. Make this plan in advance with your health care provider.  Share your diabetes management plan with people in your workplace, school, and household.  Check your urine for ketones when you are ill and as told by your health care provider.  Carry a medical alert card or wear medical alert jewelry. Contact a health care provider if:  Your blood glucose is at or above 240 mg/dL (13.3 mmol/L) for 2 days in a row.  You have problems keeping your blood glucose in your target range.  You have frequent episodes of hyperglycemia. Get help right away if:  You have difficulty breathing.  You have a change in how you think, feel, or act (mental status).  You have nausea or vomiting that does not go away. These symptoms may represent a serious problem that is an emergency. Do not wait to see if the symptoms will go away. Get medical help right away. Call your local emergency services (911 in the [place] Do not drive yourself to the hospital. Summary  Hyperglycemia occurs when the level of sugar (glucose) in the blood is too high.  Hyperglycemia is diagnosed  with a blood test to measure your blood glucose level. This blood test is usually done while you are having symptoms. Your health care provider may also do a physical exam and review your medical history.  If you have diabetes, follow your diabetes management plan as told by your health care provider.  Contact your health care provider if you have problems keeping your blood glucose in your target range. This information is not intended to replace advice given to you by your health care provider. Make sure you  discuss any questions you have with your health care provider. Document Revised: 07/05/2016 Document Reviewed: 07/05/2016 Elsevier Patient Education  [place].

## 2020-01-21 NOTE — Progress Notes (Signed)
Inpatient Diabetes Program Recommendations  AACE/ADA: New Consensus Statement on Inpatient Glycemic Control (2015)  Target Ranges:  Prepandial:   less than 140 mg/dL      Peak postprandial:   less than 180 mg/dL (1-2 hours)      Critically ill patients:  140 - 180 mg/dL   Lab Results  Component Value Date   GLUCAP 142 (H) 01/21/2020   HGBA1C 10.0 (H) 01/15/2020    Review of Glycemic Control Results for Robert Sanchez, Robert Sanchez (MRN [id number]) as of 01/21/2020 13:51  Ref. Range 01/20/2020 16:19 01/20/2020 21:27 01/21/2020 06:12  Glucose-Capillary Latest Ref Range: 70 - 99 mg/dL 673 (H) 419 (H) 379 (H)   Diabetes history: Type 2 DM Outpatient Diabetes medications: Glipizide 5 mg QD, Metformin 1000 mg BID Current orders for Inpatient glycemic control: Novolog 0-15 units TID, Novolog 0-5 units QHS  Inpatient Diabetes Program Recommendations:    Spoke with patient regarding outpatient diabetes management. Verifies home medications and states, "I don't think my Metformin is working anymore." Reviewed patient's current A1c of 10.0% which is up from 8% (09/2019). Explained what a A1c is and what it measures. Also reviewed goal A1c with patient, importance of good glucose control @ home, and blood sugar goals. Reviewed patho of DM, role of beta cells in pancreas, action of Metformin, current inpatient trends, possible need for insulin, vascular changes with poor glycemic control, increased risk for infection, and other commorbidities. Patient has a meter and supplies, reports checking 2-3 times per day. He states that levels have ranged from 200-300 in the last few weeks. Encouraged to continue checking and when to call MD.  Briefly discussed insulin given A1C. Encouraged to discuss with PCP at next visit. Will attach Relion information to DC summary.  Denies drinking sugary beverages, but admits to eating a lot of fast food because of living alone and convienance. Reviewed plate method and encouraged  mindfulness of carb intake.  Stressed the importance of following up with PCP for plan to get A1C down. Patient has no furhter questions.   Thanks, Lujean Rave, MSN, RNC-OB Diabetes Coordinator [phone] (8a-5p)

## 2020-01-21 NOTE — Discharge Summary (Signed)
Physician Discharge Summary  Robert Sanchez ZOX:[id number] DOB: [birth year] DOA: 01/15/2020  PCP: Doristine Bosworth, MD  Admit date: 01/15/2020 Discharge date: 01/21/2020  Time spent: 50 minutes  Recommendations for Outpatient Follow-up:  1. Follow-up vascular surgery as outpatient   Discharge Diagnoses:  Principal Problem:   Cellulitis of foot Active Problems:   Pulmonary edema   PVD (peripheral vascular disease) (HCC)   PAD (peripheral artery disease) (HCC)   Acute on chronic systolic CHF (congestive heart failure) (HCC)   Critical ischemia of extremity with history of revascularization of same extremity   Leukocytosis   CKD (chronic kidney disease), stage III   Discharge Condition: Stable  Diet recommendation: Carb modified diet  Filed Weights   01/19/20 0635 01/20/20 0406 01/21/20 0529  Weight: 99 kg 98.7 kg 98.5 kg    History of present illness:  64 year old male with history of CAD s/p CABG, PVD, s/p hypertension, hyperlipidemia, COPD, diabetes mellitus type 2 with neuropathy, CKD stage III, GERD presented with complaints of left foot pain and redness over the past 2 to 3 days.  He previously had left femoral to posterior tibial artery grafting performed in October 2019 by Dr. Chestine Spore of vascular surgery.  Following bypass surgery he reported that his foot was getting good blood flow but he was never able to move the lateral 3 toes.  X-ray of the foot in the ED showed subcutaneous swelling, patient was started on empiric antibiotics, Zosyn.  Vascular surgery was consulted  Hospital Course:   1. Cellulitis of left foot-improving, patient presented with redness and increased warmth of left foot.  No clear signs of injury noted.  X-ray imaging showed diffuse soft tissue edema concerning for cellulitis.  Patient was started on vancomycin and Zosyn.    Patient is clinically improved.  Vascular surgery recommends stopping antibiotics.  Patient has completed 6 days of treatment in  the hospital. 2. Peripheral arterial disease/critical limb ischemia-patient has history of left femoral to posterior tibial bypass with reverse saphenous vein on 10/19.  Vascular surgery was consulted.  Vascular ultrasound with ABI showed severe left lower extremity arterial disease, patient underwent aortogram, left lower extremity arteriogram with placement of thrombolytic catheter in the left lower extremity bypass with initiation of thrombolysis.  Patient received IV alteplase, IV heparin.  Underwent mechanical thrombectomy yesterday and also thrombolysis catheter check for occluded left lower extremity bypass.  Vascular surgery recommends starting on Eliquis and Plavix as outpatient.  Will discontinue IV heparin and discharge patient on Eliquis and Plavix.  Patient to follow-up with vascular surgery as outpatient for amputation of left big toe.  Will stop aspirin. 3. Diabetes mellitus type 2-patient is on glipizide and Metformin at home.   4. CKD stage III-patient's creatinine is 1.38, back to baseline.  5. Acute on chronic systolic CHF exacerbation-chest x-ray was concerning for cardiomegaly with mild interstitial edema.  He received 1 dose of Lasix 40 mg IV.    He did not have shortness of breath.  Continue Lasix 40 mg p.o. daily at home.  Procedures:  Lower extremity arteriogram, placement of thrombolytic catheter in left lower extremity, thrombolysis  Consultations:  Vascular surgery  Discharge Exam: Vitals:   01/21/20 0529 01/21/20 0856  BP:  (!) 113/56  Pulse:  79  Resp: 19 18  Temp: (!) 97.5 F (36.4 C) 97.8 F (36.6 C)  SpO2: 100% 95%    General: Appears in no acute distress Cardiovascular: S1-S2, regular, no murmur auscultated Respiratory: Clear to auscultation bilaterally  Discharge Instructions    Allergies as of 01/21/2020      Reactions   Lisinopril Other (See Comments)   Syncope   Codeine Rash      Medication List    STOP taking these medications    aspirin 325 MG EC tablet     TAKE these medications   apixaban 5 MG Tabs tablet Commonly known as: ELIQUIS Take 1 tablet (5 mg total) by mouth 2 (two) times daily.   atorvastatin 40 MG tablet Commonly known as: LIPITOR Take 40 mg by mouth at bedtime.   carvedilol 6.25 MG tablet Commonly known as: COREG Take 1 tablet (6.25 mg total) by mouth 2 (two) times daily with a meal. What changed: when to take this   clopidogrel 75 MG tablet Commonly known as: Plavix Take 1 tablet (75 mg total) by mouth daily.   furosemide 40 MG tablet Commonly known as: LASIX Take 1 tablet (40 mg total) by mouth daily.   glipiZIDE 5 MG tablet Commonly known as: GLUCOTROL Take 1 tablet (5 mg total) by mouth 2 (two) times daily before a meal.   metFORMIN 1000 MG tablet Commonly known as: GLUCOPHAGE Take 1 tablet (1,000 mg total) by mouth 2 (two) times daily with a meal.   oxyCODONE-acetaminophen 5-325 MG tablet Commonly known as: PERCOCET/ROXICET Take 1 tablet by mouth every 6 (six) hours as needed for moderate pain.   potassium chloride SA 20 MEQ tablet Commonly known as: KLOR-CON Take 1 tablet (20 mEq total) by mouth daily.      Allergies  Allergen Reactions  . Lisinopril Other (See Comments)    Syncope  . Codeine Rash      The results of significant diagnostics from this hospitalization (including imaging, microbiology, ancillary and laboratory) are listed below for reference.    Significant Diagnostic Studies: DG Chest 1 View  Result Date: 01/15/2020 CLINICAL DATA:  MI, COPD, redness, warmth and swelling to left foot EXAM: CHEST  1 VIEW COMPARISON:  05/04/2019 FINDINGS: Mild cardiomegaly status post median sternotomy and CABG. Mild, diffuse interstitial pulmonary opacity. No focal airspace opacity. IMPRESSION: Mild cardiomegaly with mild diffuse interstitial pulmonary opacity likely reflecting pulmonary edema. No focal airspace opacity. Electronically Signed   By: Lauralyn Primes M.D.    On: 01/15/2020 09:51   PERIPHERAL VASCULAR CATHETERIZATION  Result Date: 01/17/2020 Patient name: Robert Sanchez     MRN: [id number]        DOB: 04/04/1956          Sex: male  01/17/2020 Pre-operative Diagnosis: Critical limb ischemia of the left lower extremity with occluded common femoral to posterior tibial artery vein bypass Post-operative diagnosis:  Same Surgeon:  Cephus Shelling, MD Procedure Performed: 1.  Thrombolytics catheter check of the left lower extremity including left lower extremity arteriogram with selection of third order branches 2.  Left common femoral to posterior tibial artery vein bypass angioplasty (4 mm x 220 mm Sterling and 5 mm x 220 mm Sterling) 3.  Percutaneous mechanical thrombectomy of the left common femoral to posterior tibial artery bypass (CAT 6 penumbra percutaneous mechanical thrombectomy) 4.  Left posterior tibial artery angioplasty (3 mm x 150 mm Sterling) 5.  Mynx closure of the right common femoral artery 6.  78 minutes of monitored moderate conscious sedation time  Indications:   64 year old male who presented with occluded left common femoral to posterior tibial artery vein bypass after 3 days of worsening left foot pain and cellulitis.  He underwent thrombolytics catheter  placement in the bypass yesterday and has been undergoing thrombolysis in the ICU with alteplase.  He presents today after risk benefits were discussed.  Findings:  Thrombolytics catheter check showed diminutive flow down the left common femoral to posterior tibial artery bypass although it was now patent with flow into the ankle at the posterior tibial.  Initially performed balloon angioplasty of the entire bypass initially with a 4 mm x 220 mm Sterling and then a 5 mm x 220 mm Sterling.  On repeat injection, I could not identify any flow down the bypass and all flow was down the profunda.  Ultimately we elected to perform percutaneous mechanical thrombectomy with penumbra cat 6 catheter  and retrieved several large clots proximally.  I then had reestablished flow down the bypass with inline flow into the ankle at the posterior tibial.  There was a diseased posterior tibial proximally we treated with a 3 mm Sterling.  He has a very brisk posterior tibial signal at the ankle.  All catheters were removed and mynx closure was used to close the right common femoral artery.  He will be restarted on heparin immediately.             Procedure:  The patient was identified in the holding area and taken to room 8.  The patient was then placed supine on the table and prepped and draped in the usual sterile fashion.  A time out was called.  Initially confirmed that the thrombolytics catheter remained in the left lower extremity bypass and we removed the inner cannula and placed a long V 18 wire down distal into the calf in the distal bypass.  The Unifuse catheter was subsequently removed.  Ultimately we then performed hand injections of the left lower extremity bypass and this showed now evidence of flow down the bypass although it looked very diminutive.  Subsequently elected to perform balloon angioplasty and started with a 4 mm x 220 mm Sterling and angioplastied the entire length of the bypass from the common femoral anastomosis all the way down to the PT.  There was a significant a waste in the balloon proximally and my impression was there was some sclerotic narrowed segment of the bypass in the proximal segment whereas this bypass was more robust distally.  We gave initially 5000 units of IV heparin at the start of the case and then we checked ACT and were subtherapeutic and gave an additional 5000 units and then 3000 units to get therapeutic.  Upon another injection we had no flow down the bypass after balloon angioplasty.  I then elected to upsized to a 5 mm x 220 m Sterling and performed balloon angioplasty from the proximal anastomosis all the way down to the knee joint where there appeared to be a  narrowing in the bypass based on waste in the balloon from our initial angioplasty with a 4 mm balloon.  Again more distal it looked robust and healthy.  Again I had no flow down the bypass and given a subtherapeutic ACT initially, I was suspicious that there was thrombus again in the bypass graft or remnant thrombus.  I elected to perform percutaneous mechanical thrombectomy we then put a long Rosen wire down the bypass and exchanged for a 7 [place] destination sheath placed over the aortic bifurcation.  We then used a long 0.035 exchange catheter and put our V 18 wire back down.  I then performed percutaneous mechanical thrombectomy of the bypass throughout the entire length with a  CAT 6 catheter.  We retrieve several large lots from the proximal bypass.  After that we then had persistent flow in the aspiration catheter throughout the entire length of the bypass suggesting we had reestablished flow.  I then shot another picture thought the sheath and this looked much better with inline flow down the bypass after percutaneous mechanical thrombectomy.  I then reevaluated and the only other residual stenosis I saw was in the PT distal to the anastomosis we selected a 3 mm Sterling and perform balloon angioplasty to nominal pressure for 2 minutes of the PT and distal anastomosis for 2 minutes at nominal pressure.  Once we were satisfied with the results and had excellent inline flow into the foot wires and catheters were removed.  There was brisk flow into the foot.  We used a J-wire and exchanged for a short 7 French sheath in the right groin.  Ultimately mynx closure was deployed in the right groin.  Pressure was held.  Plan: Patient will be immediately restarted on heparin without bolus.  He has reeestablished flow down the left lower extremity bypass with a brisk posterior tibial signal.    Cephus Shelling, MD [facility] Office: [phone]   PERIPHERAL VASCULAR  CATHETERIZATION  Result Date: 01/16/2020 Patient name: Robert Sanchez     MRN: [id number]        DOB: Sep 28, 1956          Sex: male  01/16/2020 Pre-operative Diagnosis: Critical limb ischemia of the left lower extremity with occluded common femoral to posterior tibial artery vein bypass Post-operative diagnosis:  Same Surgeon:  Cephus Shelling, MD Procedure Performed: 1.  Ultrasound-guided access of the right common femoral artery 2.  Aortogram 3.  Left lower extremity arteriogram with selection of third order branches 4.  Placement of thrombolytic's catheter in left lower extremity bypass with initiation of thrombolysis 5.  61 minutes of monitored moderate conscious sedation time  Indications: Patient is a 64 year old male who presented to the ED yesterday with 3 days of worsening left foot pain and cellulitis.  He has a known left common femoral artery to posterior tibial artery bypass with saphenous vein that was placed in 2019.  He has not been seen in follow-up and he missed all of his surveillance appointments.  He was found to have an occluded bypass and presents today for attempted thrombolysis after risks and benefits were discussed.  Findings:  Aortogram showed that his left external iliac artery stent only has about a 20% stenosis with no overt flow limitation.  There is a calcified plaque in the proximal left external iliac artery.  No other hemodynamically significant aortoiliac lesions.  The left common femoral and profunda remained patent on the left.  There is a short stump of SFA and then the SFA is occluded for long segment.  The left common femoral to PT bypass is occluded throughout its course and a small stump of bypass was visualized off the common femoral artery.  He does reconstitute a distal SFA and above-knee popliteal artery that is heavily diseased and has a patent below-knee popliteal artery but also looks heavily diseased.  Dominant runoff in the left lower extremities via  the peroneal and posterior tibial artery with a high-grade stenosis in the proximal AT.  Ultimately the bypass in the left lower extremity was selected and placed a long 136 cm Unifuse catheter with 50 cm infusion length into the bypass and will initiate thrombolysis overnight.  Procedure:  The patient was identified in the holding area and taken to room 8.  The patient was then placed supine on the table and prepped and draped in the usual sterile fashion.  A time out was called.  Ultrasound was used to evaluate the right common femoral artery.  It was patent .  A digital ultrasound image was acquired.  A micropuncture needle was used to access the right common femoral artery under ultrasound guidance.  An 018 wire was advanced without resistance and a micropuncture sheath was placed.  The 018 wire was removed and a J wire was placed.  The micropuncture sheath was exchanged for a 5 french sheath.  An omniflush catheter was advanced over the wire to the level of L-1.  An abdominal angiogram was obtained.  Next the Omni Flush catheter was used to select the left iliac and the catheter was advanced to the distal left external iliac.  Left lower extremity runoff was then obtained with pertinent findings noted above.  Ultimately the left common femoral to posterior tibial artery vein bypass is occluded throughout its course.  Subsequently exchanged for a KMP catheter with a Glidewire advantage in order to identify the takeoff of the bypass and we were finally able to get a wire into and cannulate the bypass and got a wire all the way down into the distal calf of the bypass.  That point in time patient was given 8000 units of IV heparin.  I then exchanged for a 6 [place] long Ansell sheath in the right groin over the aortic bifurcation into the left common femoral.  Over this we then tried to advance a UniFuse catheter but had some trouble advancing the Unifuse catheter and our sheath kept kicking up into the  aorta.  I then exchanged with a long 100 cm flush catheter for an Amplatz wire for more support down the bypass.  Finally able to place a long 135 cm UniFuse catheter with 50 cm infusion length into the bypass.  Alteplase will run at 1 mg/hr through bypass and heparin at 800 units/hr through sheath.  Will admit to ICU for thrombolysis overnight.  The right groin sheath was secured.  Pain: Patient will return tomorrow for thrombolysis catheter check.  Cephus Shelling, MD [facility] Office: [phone]   DG Foot 2 Views Left  Result Date: 01/15/2020 CLINICAL DATA:  Vascular insufficiency, cellulitis EXAM: LEFT FOOT - 2 VIEW COMPARISON:  None. FINDINGS: No fracture or dislocation of the left foot. Mild first metatarsophalangeal and midfoot arthrosis. Diffuse soft tissue edema. IMPRESSION: No fracture or dislocation of the left foot. Diffuse soft tissue edema. Electronically Signed   By: Lauralyn Primes M.D.   On: 01/15/2020 09:52   VAS [place] ABI WITH/WO TBI  Result Date: 01/15/2020 LOWER EXTREMITY DOPPLER STUDY Indications: Peripheral artery disease, and s/p fem pop bypass now with              ischemia. High Risk Factors: Diabetes, coronary artery disease.  Vascular Interventions: 08/21/18 BYPASS GRAFT LEFT FEMORAL TO POSTERIOR TIBIAL                         ARTERY USING LEFT REVERSED GREAT SAPHENOUS VEIN (Left )                         VEIN HARVEST LEFT GREAT SAPHENOUS VEIN (Left ). Comparison Study: previous 08/22/18 Performing Technologist:  Blanch Media RVS  Examination Guidelines: A complete evaluation includes at minimum, Doppler waveform signals and systolic blood pressure reading at the level of bilateral brachial, anterior tibial, and posterior tibial arteries, when vessel segments are accessible. Bilateral testing is considered an integral part of a complete examination. Photoelectric Plethysmograph (PPG) waveforms and toe systolic pressure readings are included as  required and additional duplex testing as needed. Limited examinations for reoccurring indications may be performed as noted.  ABI Findings: +---------+------------------+-----+----------+--------+ Right    Rt Pressure (mmHg)IndexWaveform  Comment  +---------+------------------+-----+----------+--------+ Brachial 146                    triphasic          +---------+------------------+-----+----------+--------+ PTA      91                0.62 monophasic         +---------+------------------+-----+----------+--------+ DP       93                0.64 monophasic         +---------+------------------+-----+----------+--------+ Great Toe70                0.48                    +---------+------------------+-----+----------+--------+ +---------+------------------+-----+--------------------------------+-------+ Left     Lt Pressure (mmHg)IndexWaveform                        Comment +---------+------------------+-----+--------------------------------+-------+ Brachial 128                    triphasic                               +---------+------------------+-----+--------------------------------+-------+ PTA                             not audible, but heard on duplex        +---------+------------------+-----+--------------------------------+-------+ DP       62                0.42 monophasic                              +---------+------------------+-----+--------------------------------+-------+ Great Toe                       Absent                                  +---------+------------------+-----+--------------------------------+-------+ +-------+-----------+-----------+------------+------------+ ABI/TBIToday's ABIToday's TBIPrevious ABIPrevious TBI +-------+-----------+-----------+------------+------------+ Right  0.64       0.48                                +-------+-----------+-----------+------------+------------+ Left   0.42                                            +-------+-----------+-----------+------------+------------+  Summary: Right: Resting right ankle-brachial index indicates moderate right lower extremity arterial disease. The right toe-brachial index is abnormal. Left: Resting left ankle-brachial index indicates severe left lower  extremity arterial disease. PTA not audible on abi, but able hear monophasic waveform to on duplex.  *See table(s) above for measurements and observations.  Electronically signed by Coral Else MD on 01/15/2020 at 9:43:06 PM.    Final    VAS [place] LOWER EXTREMITY ARTERIAL DUPLEX  Result Date: 01/15/2020 LOWER EXTREMITY ARTERIAL DUPLEX STUDY Indications: Pain, swelling with an ischemia foot and great toe. High Risk Factors: Diabetes, coronary artery disease.  Vascular Interventions: 08/21/18 - left femoral to posterior bypass graft using                         reversed GSV. Current ABI:            right .64, left .42 Performing Technologist: Gertie Fey RDMS, RVT, RDCS Supporting Technologist: Marilynne Halsted RDMS, RVT  Examination Guidelines: A complete evaluation includes B-mode imaging, spectral Doppler, color Doppler, and power Doppler as needed of all accessible portions of each vessel. Bilateral testing is considered an integral part of a complete examination. Limited examinations for reoccurring indications may be performed as noted. +++++++  +++++++  +++++++  Left Graft #1: +--------------------+--------+--------+----------+--------+                     PSV cm/sStenosisWaveform  Comments +--------------------+--------+--------+----------+--------+ Inflow              59              monophasic         +--------------------+--------+--------+----------+--------+ Proximal Anastomosis0       occluded                   +--------------------+--------+--------+----------+--------+ Proximal Graft      0       occluded                    +--------------------+--------+--------+----------+--------+ Mid Graft           0       occluded                   +--------------------+--------+--------+----------+--------+ Distal Graft        0       occluded                   +--------------------+--------+--------+----------+--------+ Distal Anastomosis  0       occluded                   +--------------------+--------+--------+----------+--------+ Outflow             36              monophasic         +--------------------+--------+--------+----------+--------+   Summary: Left: Occluded left femoral to posterior bypass graft.  See table(s) above for measurements and observations. Electronically signed by Coral Else MD on 01/15/2020 at 9:43:42 PM.    Final     Microbiology: Recent Results (from the past 240 hour(s))  SARS CORONAVIRUS 2 (TAT 6-24 HRS) Nasopharyngeal Nasopharyngeal Swab     Status: None   Collection Time: 01/15/20  9:44 AM   Specimen: Nasopharyngeal Swab  Result Value Ref Range Status   SARS Coronavirus 2 NEGATIVE NEGATIVE Final    Comment: (NOTE) SARS-CoV-2 target nucleic acids are NOT DETECTED. The SARS-CoV-2 RNA is generally detectable in upper and lower respiratory specimens during the acute phase of infection. Negative results do not preclude SARS-CoV-2 infection, do not rule out co-infections with other  pathogens, and should not be used as the sole basis for treatment or other patient management decisions. Negative results must be combined with clinical observations, patient history, and epidemiological information. The expected result is Negative. Fact Sheet for Patients: HairSlick.nohttps://www.fda.gov/media/138098/download Fact Sheet for Healthcare Providers: quierodirigir.comhttps://www.fda.gov/media/138095/download This test is not yet approved or cleared by the [place] FDA and  has been authorized for detection and/or diagnosis of SARS-CoV-2 by FDA under an Emergency Use Authorization (EUA). This EUA  will remain  in effect (meaning this test can be used) for the duration of the COVID-19 declaration under Section 56 4(b)(1) of the Act, 21 U.S.C. section 360bbb-3(b)(1), unless the authorization is terminated or revoked sooner. Performed at [facility], [address] [place], [place] [address]   MRSA PCR Screening     Status: None   Collection Time: 01/16/20  6:30 PM   Specimen: Nasal Mucosa; Nasopharyngeal  Result Value Ref Range Status   MRSA by PCR NEGATIVE NEGATIVE Final    Comment:        The GeneXpert MRSA Assay (FDA approved for NASAL specimens only), is one component of a comprehensive MRSA colonization surveillance program. It is not intended to diagnose MRSA infection nor to guide or monitor treatment for MRSA infections. Performed at [facility], [address] [address] [place], [place] [id number]      Labs: Basic Metabolic Panel: Recent Labs  Lab 01/15/20 0750 01/16/20 0244 01/17/20 0642 01/18/20 0300 01/19/20 0224  NA 139 136 138 138 135  K 4.5 4.3 4.3 4.0 4.1  CL 105 101 104 106 105  CO2 22 20* 22 21* 19*  GLUCOSE 234* 174* 165* 165* 153*  BUN 23 26* 20 16 17   CREATININE 1.32* 1.20 1.38* 1.23 1.38*  CALCIUM 9.3 9.0 8.8* 8.8* 8.6*   Liver Function Tests: Recent Labs  Lab 01/15/20 0750 01/18/20 0300  AST 13* 11*  ALT 16 11  ALKPHOS 68 44  BILITOT 0.8 1.2  PROT 7.9 6.8  ALBUMIN 3.7 3.0*   No results for input(s): LIPASE, AMYLASE in the last 168 hours. No results for input(s): AMMONIA in the last 168 hours. CBC: Recent Labs  Lab 01/15/20 0750 01/16/20 0244 01/17/20 0642 01/18/20 0300 01/19/20 0224 01/20/20 0250 01/21/20 0335  WBC 12.5*   < > 10.2 9.6 9.4 9.6 9.8  NEUTROABS 9.4*  --   --   --   --   --   --   HGB 13.4   < > 12.2* 11.6* 10.6* 11.0* 10.7*  HCT 41.7   < > 37.1* 35.8* 32.8* 33.7* 33.2*  MCV 92.3   < > 91.6 92.7 92.1 91.6 94.1  PLT 314   < > 271 247 238 223 205   < > = values in this interval not displayed.    Cardiac Enzymes: No results for input(s): CKTOTAL, CKMB, CKMBINDEX, TROPONINI in the last 168 hours. BNP: BNP (last 3 results) Recent Labs    05/04/19 1658 01/15/20 2228  BNP 1,562.1* 244.9*    ProBNP (last 3 results) No results for input(s): PROBNP in the last 8760 hours.  CBG: Recent Labs  Lab 01/20/20 0610 01/20/20 1134 01/20/20 1619 01/20/20 2127 01/21/20 0612  GLUCAP 153* 206* 144* 193* 142*       Signed:  Meredeth IdeGagan S Joniel Graumann MD.  [facility] 01/21/2020, 12:32 PM

## 2020-01-21 NOTE — Progress Notes (Addendum)
ANTICOAGULATION CONSULT NOTE - Follow Up Consult  Pharmacy Consult for heparin Indication: ischemic leg  Labs: Recent Labs    01/19/20 0224 01/19/20 0224 01/19/20 0921 01/19/20 1609 01/20/20 0250 01/21/20 0335  HGB 10.6*   < >  --   --  11.0* 10.7*  HCT 32.8*  --   --   --  33.7* 33.2*  PLT 238  --   --   --  223 205  HEPARINUNFRC 0.27*  --    < > 0.47 0.48 0.37  CREATININE 1.38*  --   --   --   --   --    < > = values in this interval not displayed.   . sodium chloride Stopped (01/16/20 0817)  . sodium chloride 10 mL/hr at 01/16/20 1038  . sodium chloride 100 mL/hr at 01/17/20 0900  . sodium chloride    . heparin 1,950 Units/hr (01/21/20 0129)  . piperacillin-tazobactam (ZOSYN)  IV 3.375 g (01/21/20 0234)  . vancomycin 750 mg (01/21/20 0123)     Assessment: 64yo male s/p thrombolysis, mechanical thrombectomy, on IV Heparin.    Heparin level is therapeutic at 0.37 on heparin rate of 1950 units/hr.  No overt bleeding or complications per RN.  No known issues with heparin infusion. Hg stable at 10.7. Plts are wnls  Goal of Therapy:  Heparin level 0.3-0.7 units/ml   Plan:  1. Continue heparin infusion at 1950 units/hr 2. Watch for signs/symptoms of bleeding 3. Daily heparin level and CBC. Monitor Hg closely  4. F/u oral anticoagulation once able.  Sheppard Coil PharmD., BCPS Clinical Pharmacist 01/21/2020 8:40 AM  Addendum:  Orders to transition to apixaban this am  Apixaban 5mg  bid to start this am  01/21/2020 11:30 AM

## 2020-01-21 NOTE — Progress Notes (Addendum)
  Progress Note    01/21/2020 7:40 AM 4 Days Post-Op  Subjective:  Wants to go home.  R toe feels better   Vitals:   01/20/20 2302 01/21/20 0529  BP: (!) 111/56   Pulse:    Resp:  19  Temp: 98 F (36.7 C) (!) 97.5 F (36.4 C)  SpO2: 96% 100%   Physical Exam: Lungs:  Non labored Incisions:  R groin some bruising but soft Extremities:  L foot DP and PT brisk by doppler Neurologic: A&O  CBC    Component Value Date/Time   WBC 9.8 01/21/2020 0335   RBC 3.53 (L) 01/21/2020 0335   HGB 10.7 (L) 01/21/2020 0335   HCT 33.2 (L) 01/21/2020 0335   PLT 205 01/21/2020 0335   MCV 94.1 01/21/2020 0335   MCH 30.3 01/21/2020 0335   MCHC 32.2 01/21/2020 0335   RDW 13.8 01/21/2020 0335   LYMPHSABS 1.5 01/15/2020 0750   MONOABS 1.2 (H) 01/15/2020 0750   EOSABS 0.2 01/15/2020 0750   BASOSABS 0.1 01/15/2020 0750    BMET    Component Value Date/Time   NA 135 01/19/2020 0224   NA 136 09/14/2019 1701   K 4.1 01/19/2020 0224   CL 105 01/19/2020 0224   CO2 19 (L) 01/19/2020 0224   GLUCOSE 153 (H) 01/19/2020 0224   BUN 17 01/19/2020 0224   BUN 20 09/14/2019 1701   CREATININE 1.38 (H) 01/19/2020 0224   CREATININE 0.81 08/14/2014 1126   CALCIUM 8.6 (L) 01/19/2020 0224   GFRNONAA 55 (L) 01/19/2020 0224   GFRNONAA >89 08/14/2014 1126   GFRAA >60 01/19/2020 0224   GFRAA >89 08/14/2014 1126    INR    Component Value Date/Time   INR 1.2 01/16/2020 0244     Intake/Output Summary (Last 24 hours) at 01/21/2020 0740 Last data filed at 01/21/2020 0609 Gross per 24 hour  Intake 865.37 ml  Output 1050 ml  Net -184.63 ml     Assessment/Plan:  64 y.o. male is s/p  1.Thrombolytics catheter check of the left lower extremity including left lower extremity arteriogram with selection of third order branches 2.Left common femoral to posterior tibial artery vein bypass angioplasty (4 mm x 220 mm Sterling and 5 mm x 220 mm Sterling) 3.Percutaneous mechanical thrombectomy of the left  common femoral to posterior tibial artery bypass (CAT6 penumbra percutaneous mechanical thrombectomy) 4.Left posterior tibial artery angioplasty (3 mm x 150 mm Sterling) 5.Mynx closure of the right common femoral artery   4 Days Post-Op    Patent bypass with well perfused L foot Dr. Chestine Spore to decide on antibiotics and anticoagulation Will allow GT to demarcate  Emilie Rutter, PA-C [facility] [phone] 01/21/2020 7:40 AM   I have seen and evaluated the patient. I agree with the PA note as documented above. S/P left fem PT bypass thrombolysis and mechanical thrombectomy.  Bypass patent with brisk PT and bypass signal.  Foot looks much better.  Will need full anticoagulation at discharge.  OK to transition to DOAC and plavix for discharge and stop antibiotics.  I will arrange follow-up in several weeks and allow toe to demarcate.  May ultimately require great toe amp.  Can be discharged from my standpoint today if DOAC arranged.  Cephus Shelling, MD [facility] [place] Office: [phone]

## 2020-01-30 ENCOUNTER — Telehealth: Payer: Self-pay

## 2020-01-30 NOTE — Telephone Encounter (Signed)
No further nursing notes. 

## 2020-01-30 NOTE — Telephone Encounter (Signed)
Pt called with c/o his left great toe turning "slightly more black". He said it is slight and is aware that he may need this toe to be amputated. He has no pain or other concerns. He has an appt for f/u early next week and will keep an eye on it and if it worsens or anything changes he will let us know. I have let Samantha, PA know, as he is f/u with PA next week here.

## 2020-01-31 ENCOUNTER — Other Ambulatory Visit: Payer: Self-pay | Admitting: *Deleted

## 2020-01-31 DIAGNOSIS — I739 Peripheral vascular disease, unspecified: Secondary | ICD-10-CM

## 2020-01-31 DIAGNOSIS — I998 Other disorder of circulatory system: Secondary | ICD-10-CM

## 2020-01-31 DIAGNOSIS — I70229 Atherosclerosis of native arteries of extremities with rest pain, unspecified extremity: Secondary | ICD-10-CM

## 2020-02-04 ENCOUNTER — Ambulatory Visit (HOSPITAL_COMMUNITY)
Admission: RE | Admit: 2020-02-04 | Discharge: 2020-02-04 | Disposition: A | Discharge status: Home / Self Care | Payer: Self-pay | Source: Ambulatory Visit | Attending: Surgery | Admitting: Surgery

## 2020-02-04 ENCOUNTER — Other Ambulatory Visit: Payer: Self-pay

## 2020-02-04 DIAGNOSIS — I998 Other disorder of circulatory system: Secondary | ICD-10-CM | POA: Insufficient documentation

## 2020-02-04 DIAGNOSIS — I739 Peripheral vascular disease, unspecified: Secondary | ICD-10-CM | POA: Insufficient documentation

## 2020-02-04 DIAGNOSIS — I70229 Atherosclerosis of native arteries of extremities with rest pain, unspecified extremity: Secondary | ICD-10-CM

## 2020-02-05 ENCOUNTER — Encounter: Payer: Self-pay | Admitting: Vascular Surgery

## 2020-02-05 ENCOUNTER — Ambulatory Visit (INDEPENDENT_AMBULATORY_CARE_PROVIDER_SITE_OTHER): Payer: Self-pay | Admitting: Vascular Surgery

## 2020-02-05 ENCOUNTER — Other Ambulatory Visit: Payer: Self-pay

## 2020-02-05 VITALS — BP 131/72 | HR 71 | Temp 97.3°F | Resp 18 | Ht 69.0 in | Wt 218.0 lb

## 2020-02-05 DIAGNOSIS — I739 Peripheral vascular disease, unspecified: Secondary | ICD-10-CM

## 2020-02-05 DIAGNOSIS — Z959 Presence of cardiac and vascular implant and graft, unspecified: Secondary | ICD-10-CM

## 2020-02-05 DIAGNOSIS — I998 Other disorder of circulatory system: Secondary | ICD-10-CM

## 2020-02-05 DIAGNOSIS — Z9889 Other specified postprocedural states: Secondary | ICD-10-CM

## 2020-02-05 NOTE — Progress Notes (Signed)
Patient name: Robert Sanchez MRN: [id number] DOB: 1956/06/08 Sex: male  REASON FOR VISIT: Hospital follow-up  HPI: Robert Sanchez is a 64 y.o. male with history of left common femoral to PT bypass with GSV on 08/21/2018 for CLI with rest pain.  Ultimately he presented to the hospital recently with an occluded bypass and underwent thrombolysis and subsequent percutaneous mechanical thrombectomy with angioplasty of the bypass and PT on 01/16/20- 01/17/2020.  Ultimately his bypass was salvaged and he was discharged on Eliquis.  He states his foot feels great.  No rest pain.  Cellulitis resolved.  He had a duplex yesterday that showed no stenosis in the bypass.  We are watching his left great toe which developed a wound after his bypass went down.  Past Medical History:  Diagnosis Date  . Acute pulmonary edema (HCC) 06/05/2018  . Arthritis   . COPD (chronic obstructive pulmonary disease) (HCC)    " beginning stages " per pt  . Coronary artery disease   . Diabetes mellitus without complication (HCC)   . Diabetic neuropathy (HCC)   . Diabetic neuropathy (HCC)   . Emphysema/COPD (HCC)    " beginning stages"  . GERD (gastroesophageal reflux disease)   . HOH (hard of hearing)   . Myocardial infarction (HCC)   . Peripheral vascular disease [facility])     Past Surgical History:  Procedure Laterality Date  . ABDOMINAL AORTOGRAM W/LOWER EXTREMITY N/A 06/21/2018   Procedure: ABDOMINAL AORTOGRAM W/LOWER EXTREMITY;  Surgeon: Cephus Shelling, MD;  [facility]: [facility] IN[facility];  Service: Cardiovascular;  Laterality: N/A;  . ABDOMINAL AORTOGRAM W/LOWER EXTREMITY Left 01/16/2020   Procedure: ABDOMINAL AORTOGRAM W/LOWER EXTREMITY;  Surgeon: Cephus Shelling, MD;  Location: [facility];  Service: Cardiovascular;  Laterality: Left;  . CORONARY ARTERY BYPASS GRAFT N/A 06/12/2018   Procedure: CORONARY ARTERY BYPASS GRAFTING (CABG) x 3 WITH ENDOSCOPIC HARVESTING OF RIGHT GREATER SAPHENOUS VEIN.  LIMA TO LAD. SVG TO PD. SVG TO DIAGONAL.;  Surgeon: Kerin Perna, MD;  Location: [facility] OR;  Service: Open Heart Surgery;  Laterality: N/A;  . FEMORAL-TIBIAL BYPASS GRAFT Left 08/21/2018   Procedure: BYPASS GRAFT LEFT FEMORAL TO POSTERIOR TIBIAL ARTERY USING LEFT REVERSED GREAT SAPHENOUS VEIN;  Surgeon: Cephus Shelling, MD;  Location: [facility] OR;  Service: Vascular;  Laterality: Left;  . KNEE ARTHROSCOPY    . LOWER EXTREMITY ANGIOGRAPHY Left 01/17/2020   Procedure: Lower Extremity Angiography;  Surgeon: Cephus Shelling, MD;  Location: [facility] INVASIVE CV LAB;  Service: Cardiovascular;  Laterality: Left;  . PERIPHERAL VASCULAR BALLOON ANGIOPLASTY Left 01/17/2020   Procedure: PERIPHERAL VASCULAR BALLOON ANGIOPLASTY;  Surgeon: Cephus Shelling, MD;  Location: [facility];  Service: Cardiovascular;  Laterality: Left;  pt  . PERIPHERAL VASCULAR INTERVENTION Left 06/21/2018   Procedure: PERIPHERAL VASCULAR INTERVENTION;  Surgeon: Cephus Shelling, MD;  Location: [facility] INVASIVE CV LAB;  Service: Cardiovascular;  Laterality: Left;  . PERIPHERAL VASCULAR THROMBECTOMY Left 01/16/2020   Procedure: PERIPHERAL VASCULAR THROMBECTOMY;  Surgeon: Cephus Shelling, MD;  [facility]: [facility];  Service: Cardiovascular;  Laterality: Left;  Lytic Catheter Placement left fem-pop bypass  . PERIPHERAL VASCULAR THROMBECTOMY Left 01/17/2020   Procedure: PERIPHERAL VASCULAR THROMBECTOMY;  Surgeon: Cephus Shelling, MD;  Location: [facility];  Service: Cardiovascular;  Laterality: Left;  fem-pt bypass  . RIGHT/LEFT HEART CATH AND CORONARY ANGIOGRAPHY N/A 06/05/2018   Procedure: RIGHT/LEFT HEART CATH AND CORONARY ANGIOGRAPHY;  Surgeon: Orpah Cobb, MD;  Location: [facility];  Service: Cardiovascular;  Laterality: N/A;  . SPINE SURGERY    . TEE WITHOUT CARDIOVERSION N/A 06/12/2018   Procedure: TRANSESOPHAGEAL ECHOCARDIOGRAM (TEE);  Surgeon: Donata Clay, Theron Arista, MD;  Location[facility] OR;  Service:  Open Heart Surgery;  Laterality: N/A;  . teeth extractions    . VEIN HARVEST Left 08/21/2018   Procedure: VEIN HARVEST LEFT GREAT SAPHENOUS VEIN;  Surgeon: Cephus Shelling, MD;  Location: [facility] OR;  Service: Vascular;  Laterality: Left;    Family History  Problem Relation Age of Onset  . Diabetes Mother   . Lung disease Mother   . Cancer Father   . Heart disease Father     SOCIAL HISTORY: Social History   Tobacco Use  . Smoking status: Former Smoker    Packs/day: 1.00    Years: 40.00    Pack years: 40.00    Quit date: 06/02/2018    Years since quitting: 1.6  . Smokeless tobacco: Never Used  Substance Use Topics  . Alcohol use: Not Currently    Allergies  Allergen Reactions  . Lisinopril Other (See Comments)    Syncope  . Codeine Rash    Current Outpatient Medications  Medication Sig Dispense Refill  . apixaban (ELIQUIS) 5 MG TABS tablet Take 1 tablet (5 mg total) by mouth 2 (two) times daily. 60 tablet 2  . atorvastatin (LIPITOR) 40 MG tablet Take 40 mg by mouth at bedtime.    . carvedilol (COREG) 6.25 MG tablet Take 1 tablet (6.25 mg total) by mouth 2 (two) times daily with a meal. (Patient taking differently: Take 6.25 mg by mouth daily. ) 60 tablet 5  . clopidogrel (PLAVIX) 75 MG tablet Take 1 tablet (75 mg total) by mouth daily. 30 tablet 11  . furosemide (LASIX) 40 MG tablet Take 1 tablet (40 mg total) by mouth daily. 30 tablet 5  . glipiZIDE (GLUCOTROL) 5 MG tablet Take 1 tablet (5 mg total) by mouth 2 (two) times daily before a meal. 60 tablet 5  . metFORMIN (GLUCOPHAGE) 1000 MG tablet Take 1 tablet (1,000 mg total) by mouth 2 (two) times daily with a meal. 60 tablet 5  . potassium chloride SA (KLOR-CON) 20 MEQ tablet Take 1 tablet (20 mEq total) by mouth daily. 30 tablet 5  . oxyCODONE-acetaminophen (PERCOCET/ROXICET) 5-325 MG tablet Take 1 tablet by mouth every 6 (six) hours as needed for moderate pain. (Patient not taking: Reported on 02/05/2020) 20 tablet 0    No current facility-administered medications for this visit.    REVIEW OF SYSTEMS:  [X]  denotes positive finding, [ ]  denotes negative finding Cardiac  Comments:  Chest pain or chest pressure:    Shortness of breath upon exertion:    Short of breath when lying flat:    Irregular heart rhythm:        Vascular    Pain in calf, thigh, or hip brought on by ambulation:    Pain in feet at night that wakes you up from your sleep:     Blood clot in your veins:    Leg swelling:         Pulmonary    Oxygen at home:    Productive cough:     Wheezing:         Neurologic    Sudden weakness in arms or legs:     Sudden numbness in arms or legs:     Sudden onset of difficulty speaking or slurred speech:  Temporary loss of vision in one eye:     Problems with dizziness:         Gastrointestinal    Blood in stool:     Vomited blood:         Genitourinary    Burning when urinating:     Blood in urine:        Psychiatric    Major depression:         Hematologic    Bleeding problems:    Problems with blood clotting too easily:        Skin    Rashes or ulcers:        Constitutional    Fever or chills:      PHYSICAL EXAM: Vitals:   02/05/20 0840  BP: 131/72  Pulse: 71  Resp: 18  Temp: (!) 97.3 F (36.3 C)  TempSrc: Temporal  SpO2: 100%  Weight: 218 lb (98.9 kg)  Height: 5\' 9"  (1.753 m)    GENERAL: The patient is a well-nourished male, in no acute distress. The vital signs are documented above. CARDIAC: There is a regular rate and rhythm.  VASCULAR:  Brisk left PT signal by doppler Brisk doppler signal in bypass in proximal calf Left foot warm      DATA:   I independently reviewed his left lower extremity bypass duplex from yesterday and the bypass is widely patent.  Assessment/Plan:  64 year old male status post thrombolysis and subsequent percutaneous mechanical thrombectomy of left common femoral to PT bypass on 01/16/20-01/17/2020 after his bypass  occluded.  Bypass remains patent as demonstrated on duplex yesterday.  He has very brisk posterior tibial and signal in the bypass on the left foot.  We are watching the left great toe where he has dry gangrene on the tip.  Discussed options of proceeding with toe amputation but his pain is reasonably well controlled, no signs of infection, and toe dry.  We will allow this to fully demarcate and will arrange follow-up in 3 to 4 weeks for wound check.  May ultimately require toe amp.   [name], MD [facility] [place] Office: [phone]

## 2020-02-12 DIAGNOSIS — Z0279 Encounter for issue of other medical certificate: Secondary | ICD-10-CM

## 2020-03-03 ENCOUNTER — Telehealth (HOSPITAL_COMMUNITY): Payer: Self-pay

## 2020-03-03 NOTE — Telephone Encounter (Signed)

## 2020-03-04 ENCOUNTER — Ambulatory Visit (INDEPENDENT_AMBULATORY_CARE_PROVIDER_SITE_OTHER): Payer: Self-pay | Admitting: Vascular Surgery

## 2020-03-04 ENCOUNTER — Other Ambulatory Visit: Payer: Self-pay

## 2020-03-04 ENCOUNTER — Encounter: Payer: Self-pay | Admitting: Vascular Surgery

## 2020-03-04 VITALS — BP 135/83 | HR 77 | Temp 97.6°F | Resp 20 | Ht 69.0 in | Wt 224.0 lb

## 2020-03-04 DIAGNOSIS — I739 Peripheral vascular disease, unspecified: Secondary | ICD-10-CM

## 2020-03-04 NOTE — Progress Notes (Signed)
Patient name: Robert Sanchez MRN: [id number] DOB: October 05, 1956 Sex: male  REASON FOR VISIT: 1 month wound check  HPI: Robert Sanchez is a 64 y.o. male with history of left common femoral to PT bypass with GSV on 08/21/2018 for CLI with rest pain.  Ultimately he presented to the hospital recently with an occluded bypass and underwent thrombolysis and subsequent percutaneous mechanical thrombectomy with angioplasty of the bypass and PT on 01/16/20- 01/17/2020.  Ultimately his bypass was salvaged and he was discharged on Eliquis.  During this event he did develop necrotic left great toe that we have been watching.  He has wanted to avoid amputation.  At home he is not having any significant pain in the toe.  States no signs of infection.  He thinks the toe is improving.  Past Medical History:  Diagnosis Date  . Acute pulmonary edema (HCC) 06/05/2018  . Arthritis   . COPD (chronic obstructive pulmonary disease) (HCC)    " beginning stages " per pt  . Coronary artery disease   . Diabetes mellitus without complication (HCC)   . Diabetic neuropathy (HCC)   . Diabetic neuropathy (HCC)   . Emphysema/COPD (HCC)    " beginning stages"  . GERD (gastroesophageal reflux disease)   . HOH (hard of hearing)   . Myocardial infarction (HCC)   . Peripheral vascular disease [facility])     Past Surgical History:  Procedure Laterality Date  . ABDOMINAL AORTOGRAM W/LOWER EXTREMITY N/A 06/21/2018   Procedure: ABDOMINAL AORTOGRAM W/LOWER EXTREMITY;  Surgeon: Cephus Shelling, MD;  Location: [facility];  Service: Cardiovascular;  Laterality: N/A;  . ABDOMINAL AORTOGRAM W/LOWER EXTREMITY Left 01/16/2020   Procedure: ABDOMINAL AORTOGRAM W/LOWER EXTREMITY;  Surgeon: Cephus Shelling, MD;  Location: [facility];  Service: Cardiovascular;  Laterality: Left;  . CORONARY ARTERY BYPASS GRAFT N/A 06/12/2018   Procedure: CORONARY ARTERY BYPASS GRAFTING (CABG) x 3 WITH ENDOSCOPIC HARVESTING OF RIGHT  GREATER SAPHENOUS VEIN. LIMA TO LAD. SVG TO PD. SVG TO DIAGONAL.;  Surgeon: Kerin Perna, MD;  Location: [facility] OR;  Service: Open Heart Surgery;  Laterality: N/A;  . FEMORAL-TIBIAL BYPASS GRAFT Left 08/21/2018   Procedure: BYPASS GRAFT LEFT FEMORAL TO POSTERIOR TIBIAL ARTERY USING LEFT REVERSED GREAT SAPHENOUS VEIN;  Surgeon: Cephus Shelling, MD;  Location: [facility] OR;  Service: Vascular;  Laterality: Left;  . KNEE ARTHROSCOPY    . LOWER EXTREMITY ANGIOGRAPHY Left 01/17/2020   Procedure: Lower Extremity Angiography;  Surgeon: Cephus Shelling, MD;  Location: [facility] INVASIVE CV LAB;  Service: Cardiovascular;  Laterality: Left;  . PERIPHERAL VASCULAR BALLOON ANGIOPLASTY Left 01/17/2020   Procedure: PERIPHERAL VASCULAR BALLOON ANGIOPLASTY;  Surgeon: Cephus Shelling, MD;  [facility]: [facility];  Service: Cardiovascular;  Laterality: Left;  pt  . PERIPHERAL VASCULAR INTERVENTION Left 06/21/2018   Procedure: PERIPHERAL VASCULAR INTERVENTION;  Surgeon: Cephus Shelling, MD;  Location: [facility] INVASIVE CV LAB;  Service: Cardiovascular;  Laterality: Left;  . PERIPHERAL VASCULAR THROMBECTOMY Left 01/16/2020   Procedure: PERIPHERAL VASCULAR THROMBECTOMY;  Surgeon: Cephus Shelling, MD;  [facility]: [facility];  Service: Cardiovascular;  Laterality: Left;  Lytic Catheter Placement left fem-pop bypass  . PERIPHERAL VASCULAR THROMBECTOMY Left 01/17/2020   Procedure: PERIPHERAL VASCULAR THROMBECTOMY;  Surgeon: Cephus Shelling, MD;  Location: [facility];  Service: Cardiovascular;  Laterality: Left;  fem-pt bypass  . RIGHT/LEFT HEART CATH AND CORONARY ANGIOGRAPHY N/A 06/05/2018   Procedure: RIGHT/LEFT HEART CATH AND CORONARY  ANGIOGRAPHY;  Surgeon: Orpah Cobb, MD;  Location: [facility];  Service: Cardiovascular;  Laterality: N/A;  . SPINE SURGERY    . TEE WITHOUT CARDIOVERSION N/A 06/12/2018   Procedure: TRANSESOPHAGEAL ECHOCARDIOGRAM (TEE);  Surgeon: Donata Clay, Theron Arista, MD;   Location: [facility] OR;  Service: Open Heart Surgery;  Laterality: N/A;  . teeth extractions    . VEIN HARVEST Left 08/21/2018   Procedure: VEIN HARVEST LEFT GREAT SAPHENOUS VEIN;  Surgeon: Cephus Shelling, MD;  Location: [place] OR;  Service: Vascular;  Laterality: Left;    Family History  Problem Relation Age of Onset  . Diabetes Mother   . Lung disease Mother   . Cancer Father   . Heart disease Father     SOCIAL HISTORY: Social History   Tobacco Use  . Smoking status: Former Smoker    Packs/day: 1.00    Years: 40.00    Pack years: 40.00    Quit date: 06/02/2018    Years since quitting: 1.7  . Smokeless tobacco: Never Used  Substance Use Topics  . Alcohol use: Not Currently    Allergies  Allergen Reactions  . Lisinopril Other (See Comments)    Syncope  . Codeine Rash    Current Outpatient Medications  Medication Sig Dispense Refill  . apixaban (ELIQUIS) 5 MG TABS tablet Take 1 tablet (5 mg total) by mouth 2 (two) times daily. 60 tablet 2  . atorvastatin (LIPITOR) 40 MG tablet Take 40 mg by mouth at bedtime.    . carvedilol (COREG) 6.25 MG tablet Take 1 tablet (6.25 mg total) by mouth 2 (two) times daily with a meal. (Patient taking differently: Take 6.25 mg by mouth daily. ) 60 tablet 5  . clopidogrel (PLAVIX) 75 MG tablet Take 1 tablet (75 mg total) by mouth daily. 30 tablet 11  . furosemide (LASIX) 40 MG tablet Take 1 tablet (40 mg total) by mouth daily. 30 tablet 5  . glipiZIDE (GLUCOTROL) 5 MG tablet Take 1 tablet (5 mg total) by mouth 2 (two) times daily before a meal. 60 tablet 5  . metFORMIN (GLUCOPHAGE) 1000 MG tablet Take 1 tablet (1,000 mg total) by mouth 2 (two) times daily with a meal. 60 tablet 5  . oxyCODONE-acetaminophen (PERCOCET/ROXICET) 5-325 MG tablet Take 1 tablet by mouth every 6 (six) hours as needed for moderate pain. 20 tablet 0  . potassium chloride SA (KLOR-CON) 20 MEQ tablet Take 1 tablet (20 mEq total) by mouth daily. 30 tablet 5   No current  facility-administered medications for this visit.    REVIEW OF SYSTEMS:  [X]  denotes positive finding, [ ]  denotes negative finding Cardiac  Comments:  Chest pain or chest pressure:    Shortness of breath upon exertion:    Short of breath when lying flat:    Irregular heart rhythm:        Vascular    Pain in calf, thigh, or hip brought on by ambulation:    Pain in feet at night that wakes you up from your sleep:     Blood clot in your veins:    Leg swelling:         Pulmonary    Oxygen at home:    Productive cough:     Wheezing:         Neurologic    Sudden weakness in arms or legs:     Sudden numbness in arms or legs:     Sudden onset of difficulty speaking or slurred speech:  Temporary loss of vision in one eye:     Problems with dizziness:         Gastrointestinal    Blood in stool:     Vomited blood:         Genitourinary    Burning when urinating:     Blood in urine:        Psychiatric    Major depression:         Hematologic    Bleeding problems:    Problems with blood clotting too easily:        Skin    Rashes or ulcers:        Constitutional    Fever or chills:      PHYSICAL EXAM: Vitals:   03/04/20 0835  BP: 135/83  Pulse: 77  Resp: 20  Temp: 97.6 F (36.4 C)  SpO2: 98%  Weight: 224 lb (101.6 kg)  Height: 5\' 9"  (1.753 m)    GENERAL: The patient is a well-nourished male, in no acute distress. The vital signs are documented above. CARDIAC: There is a regular rate and rhythm.  VASCULAR:  Brisk left PT signal by doppler Left foot warm        DATA:   None today.  Assessment/Plan:  63 year old male status post thrombolysis and subsequent percutaneous mechanical thrombectomy of left common femoral to PT bypass on 01/16/20-01/17/2020 after his bypass occluded.  Very brisk signal in the bypass and a brisk PT signal at the left ankle.  I discussed that I do not think the toe has shown any significant improvement and we could proceed  with left great toe amputation.  He really wants to avoid amputation given risk that the toe amputation could be nonhealing.  States not having any significant pain in the toe.  Ultimately offered follow-up again in 1 month for wound check to keep following the wound.  He knows to call if there is any signs of infection etc.  I did discuss that I have very low expectations that this is gone to heal but he seems to think it is making improvement.  [name], MD [facility] [place] Office: [phone]

## 2020-03-14 ENCOUNTER — Ambulatory Visit: Payer: Self-pay | Admitting: Family Medicine

## 2020-04-08 ENCOUNTER — Ambulatory Visit (INDEPENDENT_AMBULATORY_CARE_PROVIDER_SITE_OTHER): Payer: Self-pay | Admitting: Vascular Surgery

## 2020-04-08 ENCOUNTER — Encounter: Payer: Self-pay | Admitting: Vascular Surgery

## 2020-04-08 ENCOUNTER — Other Ambulatory Visit: Payer: Self-pay

## 2020-04-08 VITALS — BP 124/70 | HR 81 | Temp 97.2°F | Resp 18 | Ht 69.0 in | Wt 228.0 lb

## 2020-04-08 DIAGNOSIS — Z959 Presence of cardiac and vascular implant and graft, unspecified: Secondary | ICD-10-CM

## 2020-04-08 DIAGNOSIS — I998 Other disorder of circulatory system: Secondary | ICD-10-CM

## 2020-04-08 DIAGNOSIS — Z9889 Other specified postprocedural states: Secondary | ICD-10-CM

## 2020-04-08 NOTE — Progress Notes (Signed)
Patient name: Robert Sanchez MRN: [id number] DOB: 07/18/1956 Sex: male  REASON FOR VISIT: 1 month wound check left great toe  HPI: Robert Sanchez is a 64 y.o. male with history of left common femoral to PT bypass with GSV on 08/21/2018 for CLI with rest pain.  Ultimately he presented to the hospital earlier this year with an occluded bypass and underwent thrombolysis and subsequent percutaneous mechanical thrombectomy with angioplasty of the bypass and PT on 01/16/20- 01/17/2020.  Ultimately his bypass was salvaged and he was discharged on Eliquis.  During this event he did develop necrotic left great toe that we have been watching.  He has wanted to avoid amputation.    On follow-up today reports no significant changes.  Some pain in the toe but tolerable.  No drainage.  No fevers.  Overall foot feels much better.    Past Medical History:  Diagnosis Date  . Acute pulmonary edema (HCC) 06/05/2018  . Arthritis   . COPD (chronic obstructive pulmonary disease) (HCC)    " beginning stages " per pt  . Coronary artery disease   . Diabetes mellitus without complication (HCC)   . Diabetic neuropathy (HCC)   . Diabetic neuropathy (HCC)   . Emphysema/COPD (HCC)    " beginning stages"  . GERD (gastroesophageal reflux disease)   . HOH (hard of hearing)   . Myocardial infarction (HCC)   . Peripheral vascular disease [facility])     Past Surgical History:  Procedure Laterality Date  . ABDOMINAL AORTOGRAM W/LOWER EXTREMITY N/A 06/21/2018   Procedure: ABDOMINAL AORTOGRAM W/LOWER EXTREMITY;  Surgeon: Cephus Shelling, MD;  [facility];  Service: Cardiovascular;  Laterality: N/A;  . ABDOMINAL AORTOGRAM W/LOWER EXTREMITY Left 01/16/2020   Procedure: ABDOMINAL AORTOGRAM W/LOWER EXTREMITY;  Surgeon: Cephus Shelling, MD;  [facility];  Service: Cardiovascular;  Laterality: Left;  . CORONARY ARTERY BYPASS GRAFT N/A 06/12/2018   Procedure: CORONARY ARTERY BYPASS  GRAFTING (CABG) x 3 WITH ENDOSCOPIC HARVESTING OF RIGHT GREATER SAPHENOUS VEIN. LIMA TO LAD. SVG TO PD. SVG TO DIAGONAL.;  Surgeon: Kerin Perna, MD;  [facility];  Service: Open Heart Surgery;  Laterality: N/A;  . FEMORAL-TIBIAL BYPASS GRAFT Left 08/21/2018   Procedure: BYPASS GRAFT LEFT FEMORAL TO POSTERIOR TIBIAL ARTERY USING LEFT REVERSED GREAT SAPHENOUS VEIN;  Surgeon: Cephus Shelling, MD;  [facility];  Service: Vascular;  Laterality: Left;  . KNEE ARTHROSCOPY    . LOWER EXTREMITY ANGIOGRAPHY Left 01/17/2020   Procedure: Lower Extremity Angiography;  Surgeon: Cephus Shelling, MD;  [facility]: [facility] INVASIVE CV LAB;  Service: Cardiovascular;  Laterality: Left;  . PERIPHERAL VASCULAR BALLOON ANGIOPLASTY Left 01/17/2020   Procedure: PERIPHERAL VASCULAR BALLOON ANGIOPLASTY;  Surgeon: Cephus Shelling, MD;  [facility];  Service: Cardiovascular;  Laterality: Left;  pt  . PERIPHERAL VASCULAR INTERVENTION Left 06/21/2018   Procedure: PERIPHERAL VASCULAR INTERVENTION;  Surgeon: Cephus Shelling, MD;  Location: [facility] INVASIVE CV LAB;  Service: Cardiovascular;  Laterality: Left;  . PERIPHERAL VASCULAR THROMBECTOMY Left 01/16/2020   Procedure: PERIPHERAL VASCULAR THROMBECTOMY;  Surgeon: Cephus Shelling, MD;  [facility];  Service: Cardiovascular;  Laterality: Left;  Lytic Catheter Placement left fem-pop bypass  . PERIPHERAL VASCULAR THROMBECTOMY Left 01/17/2020   Procedure: PERIPHERAL VASCULAR THROMBECTOMY;  Surgeon: Cephus Shelling, MD;  [facility];  Service: Cardiovascular;  Laterality: Left;  fem-pt bypass  . RIGHT/LEFT HEART CATH AND CORONARY  ANGIOGRAPHY N/A 06/05/2018   Procedure: RIGHT/LEFT HEART CATH AND CORONARY ANGIOGRAPHY;  Surgeon: Dixie Dials, MD;  Location: [place] CV LAB;  Service: Cardiovascular;  Laterality: N/A;  . SPINE SURGERY    . TEE WITHOUT CARDIOVERSION N/A 06/12/2018   Procedure: TRANSESOPHAGEAL  ECHOCARDIOGRAM (TEE);  Surgeon: Prescott Gum, Collier Salina, MD;  Location: [place];  Service: Open Heart Surgery;  Laterality: N/A;  . teeth extractions    . VEIN HARVEST Left 08/21/2018   Procedure: VEIN HARVEST LEFT GREAT SAPHENOUS VEIN;  Surgeon: [name], MD;  Location: [facility] OR;  Service: Vascular;  Laterality: Left;    Family History  Problem Relation Age of Onset  . Diabetes Mother   . Lung disease Mother   . Cancer Father   . Heart disease Father     SOCIAL HISTORY: Social History   Tobacco Use  . Smoking status: Former Smoker    Packs/day: 1.00    Years: 40.00    Pack years: 40.00    Quit date: 06/02/2018    Years since quitting: 1.8  . Smokeless tobacco: Never Used  Substance Use Topics  . Alcohol use: Not Currently    Allergies  Allergen Reactions  . Lisinopril Other (See Comments)    Syncope  . Codeine Rash    Current Outpatient Medications  Medication Sig Dispense Refill  . apixaban (ELIQUIS) 5 MG TABS tablet Take 1 tablet (5 mg total) by mouth 2 (two) times daily. 60 tablet 2  . atorvastatin (LIPITOR) 40 MG tablet Take 40 mg by mouth at bedtime.    . carvedilol (COREG) 6.25 MG tablet Take 1 tablet (6.25 mg total) by mouth 2 (two) times daily with a meal. (Patient taking differently: Take 6.25 mg by mouth daily. ) 60 tablet 5  . clopidogrel (PLAVIX) 75 MG tablet Take 1 tablet (75 mg total) by mouth daily. 30 tablet 11  . furosemide (LASIX) 40 MG tablet Take 1 tablet (40 mg total) by mouth daily. 30 tablet 5  . glipiZIDE (GLUCOTROL) 5 MG tablet Take 1 tablet (5 mg total) by mouth 2 (two) times daily before a meal. 60 tablet 5  . metFORMIN (GLUCOPHAGE) 1000 MG tablet Take 1 tablet (1,000 mg total) by mouth 2 (two) times daily with a meal. 60 tablet 5  . potassium chloride SA (KLOR-CON) 20 MEQ tablet Take 1 tablet (20 mEq total) by mouth daily. 30 tablet 5  . oxyCODONE-acetaminophen (PERCOCET/ROXICET) 5-325 MG tablet Take 1 tablet by mouth every 6 (six) hours as  needed for moderate pain. (Patient not taking: Reported on 04/08/2020) 20 tablet 0   No current facility-administered medications for this visit.    REVIEW OF SYSTEMS:  [X]  denotes positive finding, [ ]  denotes negative finding Cardiac  Comments:  Chest pain or chest pressure:    Shortness of breath upon exertion:    Short of breath when lying flat:    Irregular heart rhythm:        Vascular    Pain in calf, thigh, or hip brought on by ambulation:    Pain in feet at night that wakes you up from your sleep:     Blood clot in your veins:    Leg swelling:         Pulmonary    Oxygen at home:    Productive cough:     Wheezing:         Neurologic    Sudden weakness in arms or legs:     Sudden  numbness in arms or legs:     Sudden onset of difficulty speaking or slurred speech:    Temporary loss of vision in one eye:     Problems with dizziness:         Gastrointestinal    Blood in stool:     Vomited blood:         Genitourinary    Burning when urinating:     Blood in urine:        Psychiatric    Major depression:         Hematologic    Bleeding problems:    Problems with blood clotting too easily:        Skin    Rashes or ulcers:        Constitutional    Fever or chills:      PHYSICAL EXAM: Vitals:   04/08/20 0854  BP: 124/70  Pulse: 81  Resp: 18  Temp: (!) 97.2 F (36.2 C)  TempSrc: Temporal  SpO2: 97%  Weight: 228 lb (103.4 kg)  Height: 5\' 9"  (1.753 m)    GENERAL: The patient is a well-nourished male, in no acute distress. The vital signs are documented above. CARDIAC: There is a regular rate and rhythm.  VASCULAR:  Brisk left PT signal by doppler, doppler signal in bypass as well Left foot warm Dry gangrene to tip of left great toe as pictured below     DATA:   None today.  Assessment/Plan:  64 year old male status post thrombolysis and subsequent percutaneous mechanical thrombectomy of left common femoral to PT bypass on 01/16/20-01/17/2020  after his bypass occluded.  Very brisk signal in the bypass and a brisk PT signal at the left ankle. Discussed the option of left great toe amputation again.  He is very concerned about this not healing given his diabetes etc even with patent bypass which is understandable.  States not having any significant pain in the toe.  No signs of active infection.  Toe remains dry.  Ultimately offered follow-up again in 1 month for wound check to keep following the wound.  He knows to call if there is any signs of infection etc. Discussed if the toe worsens or any signs of infection will have to proceed with amputation.  01/19/2020, MD [facility] Office: [phone]

## 2020-05-06 ENCOUNTER — Ambulatory Visit (INDEPENDENT_AMBULATORY_CARE_PROVIDER_SITE_OTHER): Payer: Self-pay | Admitting: Vascular Surgery

## 2020-05-06 ENCOUNTER — Encounter: Payer: Self-pay | Admitting: Vascular Surgery

## 2020-05-06 ENCOUNTER — Other Ambulatory Visit: Payer: Self-pay

## 2020-05-06 VITALS — BP 139/69 | HR 82 | Temp 97.2°F | Resp 18 | Ht 69.0 in | Wt 229.0 lb

## 2020-05-06 DIAGNOSIS — I739 Peripheral vascular disease, unspecified: Secondary | ICD-10-CM

## 2020-05-06 NOTE — Progress Notes (Signed)
Patient name: Robert Sanchez MRN: [id number] DOB: 1956/03/09 Sex: male  REASON FOR VISIT: 1 month wound check left great toe  HPI: Robert Sanchez is a 64 y.o. male with history of left common femoral to PT bypass with GSV on 08/21/2018 for CLI with rest pain.  Ultimately he presented to the hospital earlier this year with an occluded bypass and underwent thrombolysis and subsequent percutaneous mechanical thrombectomy with angioplasty of the bypass and PT on 01/16/20- 01/17/2020.  Ultimately his bypass was salvaged and he was discharged on Eliquis.  During this event he did develop necrotic left great toe that we have been watching.  He has wanted to avoid amputation.    On continued follow-up today feels the toe is actually getting better.  Has been watching the wound and soaking his foot in Epson salt and feels the necrotic tip is slowly improving.  No significant pain in the toe.  Wants to go back to work.  Past Medical History:  Diagnosis Date  . Acute pulmonary edema (HCC) 06/05/2018  . Arthritis   . COPD (chronic obstructive pulmonary disease) (HCC)    " beginning stages " per pt  . Coronary artery disease   . Diabetes mellitus without complication (HCC)   . Diabetic neuropathy (HCC)   . Diabetic neuropathy (HCC)   . Emphysema/COPD (HCC)    " beginning stages"  . GERD (gastroesophageal reflux disease)   . HOH (hard of hearing)   . Myocardial infarction (HCC)   . Peripheral vascular disease [facility])     Past Surgical History:  Procedure Laterality Date  . ABDOMINAL AORTOGRAM W/LOWER EXTREMITY N/A 06/21/2018   Procedure: ABDOMINAL AORTOGRAM W/LOWER EXTREMITY;  Surgeon: Cephus Shelling, MD;  [facility];  Service: Cardiovascular;  Laterality: N/A;  . ABDOMINAL AORTOGRAM W/LOWER EXTREMITY Left 01/16/2020   Procedure: ABDOMINAL AORTOGRAM W/LOWER EXTREMITY;  Surgeon: Cephus Shelling, MD;  [facility];  Service: Cardiovascular;  Laterality:  Left;  . CORONARY ARTERY BYPASS GRAFT N/A 06/12/2018   Procedure: CORONARY ARTERY BYPASS GRAFTING (CABG) x 3 WITH ENDOSCOPIC HARVESTING OF RIGHT GREATER SAPHENOUS VEIN. LIMA TO LAD. SVG TO PD. SVG TO DIAGONAL.;  Surgeon: Kerin Perna, MD;  Location: [facility] OR;  Service: Open Heart Surgery;  Laterality: N/A;  . FEMORAL-TIBIAL BYPASS GRAFT Left 08/21/2018   Procedure: BYPASS GRAFT LEFT FEMORAL TO POSTERIOR TIBIAL ARTERY USING LEFT REVERSED GREAT SAPHENOUS VEIN;  Surgeon: Cephus Shelling, MD;  [facility];  Service: Vascular;  Laterality: Left;  . KNEE ARTHROSCOPY    . LOWER EXTREMITY ANGIOGRAPHY Left 01/17/2020   Procedure: Lower Extremity Angiography;  Surgeon: Cephus Shelling, MD;  Location: [facility] INVASIVE CV LAB;  Service: Cardiovascular;  Laterality: Left;  . PERIPHERAL VASCULAR BALLOON ANGIOPLASTY Left 01/17/2020   Procedure: PERIPHERAL VASCULAR BALLOON ANGIOPLASTY;  Surgeon: Cephus Shelling, MD;  [facility];  Service: Cardiovascular;  Laterality: Left;  pt  . PERIPHERAL VASCULAR INTERVENTION Left 06/21/2018   Procedure: PERIPHERAL VASCULAR INTERVENTION;  Surgeon: Cephus Shelling, MD;  Location: [facility] INVASIVE CV LAB;  Service: Cardiovascular;  Laterality: Left;  . PERIPHERAL VASCULAR THROMBECTOMY Left 01/16/2020   Procedure: PERIPHERAL VASCULAR THROMBECTOMY;  Surgeon: Cephus Shelling, MD;  [facility];  Service: Cardiovascular;  Laterality: Left;  Lytic Catheter Placement left fem-pop bypass  . PERIPHERAL VASCULAR THROMBECTOMY Left 01/17/2020   Procedure: PERIPHERAL VASCULAR THROMBECTOMY;  Surgeon: Cephus Shelling, MD;  Location: [facility] INVASIVE CV  LAB;  Service: Cardiovascular;  Laterality: Left;  fem-pt bypass  . RIGHT/LEFT HEART CATH AND CORONARY ANGIOGRAPHY N/A 06/05/2018   Procedure: RIGHT/LEFT HEART CATH AND CORONARY ANGIOGRAPHY;  Surgeon: Orpah Cobb, MD;  [facility];  Service: Cardiovascular;  Laterality: N/A;  .  SPINE SURGERY    . TEE WITHOUT CARDIOVERSION N/A 06/12/2018   Procedure: TRANSESOPHAGEAL ECHOCARDIOGRAM (TEE);  Surgeon: Donata Clay, Theron Arista, MD;  Location: [facility] OR;  Service: Open Heart Surgery;  Laterality: N/A;  . teeth extractions    . VEIN HARVEST Left 08/21/2018   Procedure: VEIN HARVEST LEFT GREAT SAPHENOUS VEIN;  Surgeon: Cephus Shelling, MD;  Location: [facility] OR;  Service: Vascular;  Laterality: Left;    Family History  Problem Relation Age of Onset  . Diabetes Mother   . Lung disease Mother   . Cancer Father   . Heart disease Father     SOCIAL HISTORY: Social History   Tobacco Use  . Smoking status: Former Smoker    Packs/day: 1.00    Years: 40.00    Pack years: 40.00    Quit date: 06/02/2018    Years since quitting: 1.9  . Smokeless tobacco: Never Used  Substance Use Topics  . Alcohol use: Not Currently    Allergies  Allergen Reactions  . Lisinopril Other (See Comments)    Syncope  . Codeine Rash    Current Outpatient Medications  Medication Sig Dispense Refill  . apixaban (ELIQUIS) 5 MG TABS tablet Take 1 tablet (5 mg total) by mouth 2 (two) times daily. 60 tablet 2  . atorvastatin (LIPITOR) 40 MG tablet Take 40 mg by mouth at bedtime.    . carvedilol (COREG) 6.25 MG tablet Take 1 tablet (6.25 mg total) by mouth 2 (two) times daily with a meal. (Patient taking differently: Take 6.25 mg by mouth daily. ) 60 tablet 5  . clopidogrel (PLAVIX) 75 MG tablet Take 1 tablet (75 mg total) by mouth daily. 30 tablet 11  . furosemide (LASIX) 40 MG tablet Take 1 tablet (40 mg total) by mouth daily. 30 tablet 5  . glipiZIDE (GLUCOTROL) 5 MG tablet Take 1 tablet (5 mg total) by mouth 2 (two) times daily before a meal. 60 tablet 5  . metFORMIN (GLUCOPHAGE) 1000 MG tablet Take 1 tablet (1,000 mg total) by mouth 2 (two) times daily with a meal. 60 tablet 5  . potassium chloride SA (KLOR-CON) 20 MEQ tablet Take 1 tablet (20 mEq total) by mouth daily. 30 tablet 5  .  oxyCODONE-acetaminophen (PERCOCET/ROXICET) 5-325 MG tablet Take 1 tablet by mouth every 6 (six) hours as needed for moderate pain. (Patient not taking: Reported on 05/06/2020) 20 tablet 0   No current facility-administered medications for this visit.    REVIEW OF SYSTEMS:  [X]  denotes positive finding, [ ]  denotes negative finding Cardiac  Comments:  Chest pain or chest pressure:    Shortness of breath upon exertion:    Short of breath when lying flat:    Irregular heart rhythm:        Vascular    Pain in calf, thigh, or hip brought on by ambulation:    Pain in feet at night that wakes you up from your sleep:     Blood clot in your veins:    Leg swelling:         Pulmonary    Oxygen at home:    Productive cough:     Wheezing:  Neurologic    Sudden weakness in arms or legs:     Sudden numbness in arms or legs:     Sudden onset of difficulty speaking or slurred speech:    Temporary loss of vision in one eye:     Problems with dizziness:         Gastrointestinal    Blood in stool:     Vomited blood:         Genitourinary    Burning when urinating:     Blood in urine:        Psychiatric    Major depression:         Hematologic    Bleeding problems:    Problems with blood clotting too easily:        Skin    Rashes or ulcers:        Constitutional    Fever or chills:      PHYSICAL EXAM: Vitals:   05/06/20 0927  BP: 139/69  Pulse: 82  Resp: 18  Temp: (!) 97.2 F (36.2 C)  TempSrc: Temporal  SpO2: 97%  Weight: 229 lb (103.9 kg)  Height: 5\' 9"  (1.753 m)    GENERAL: The patient is a well-nourished male, in no acute distress. The vital signs are documented above. CARDIAC: There is a regular rate and rhythm.  VASCULAR:  Brisk left PT signal by doppler, doppler signal in bypass as well Left foot warm Dry gangrene to tip of left great toe as pictured below       DATA:   None today.  Assessment/Plan:  64 year old male status post thrombolysis  and subsequent percutaneous mechanical thrombectomy of left common femoral to PT bypass on 01/16/20-01/17/2020 after his bypass occluded.  Very brisk signal in the bypass and a brisk PT signal at the left ankle.  Have continued to discuss the option of left great toe amputation but he wants to avoid this.  He feels the toe is actually getting better.  There is no signs of infection and he not having any pain.  He wants to go back to work which I think is reasonable as long as he keeps the toe clean and dry.  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  I will schedule his next follow-up to 2 months now that we have been following him for a while.  At next follow-up will get ABIs and left leg arterial duplex for surveillance of his bypass.  He knows to call with any concerning signs or symptoms.  Remains on Eliquis for high risk bypass.   01/19/2020, MD [facility] Office: [phone]

## 2020-05-08 ENCOUNTER — Other Ambulatory Visit: Payer: Self-pay | Admitting: *Deleted

## 2020-05-08 DIAGNOSIS — I739 Peripheral vascular disease, unspecified: Secondary | ICD-10-CM

## 2020-05-26 ENCOUNTER — Encounter: Payer: Self-pay | Admitting: Family Medicine

## 2020-06-06 ENCOUNTER — Telehealth: Payer: Self-pay | Admitting: Family Medicine

## 2020-06-06 NOTE — Telephone Encounter (Signed)
Patient  Is   Needing   What is the name of the medication clopidogrel (PLAVIX) 75 MG tablet   Needs to do TOC for further refills /  Pt has the folowing waiting / suggested pt pick up this prescription   clopidogrel (PLAVIX) 75 MG tablet [[id number]]   Order Details Dose: 75 mg Route: Oral Frequency: Daily  Dispense Quantity: 30 tablet Refills: 11       Sig: Take 1 tablet (75 mg total) by mouth daily.      Start Date: 01/21/20 End Date: 01/20/21 after 365 doses  Written Date: 01/21/20 Expiration Date: 01/20/21  Original Order:  clopidogrel (PLAVIX) 75 MG tablet [[id number]]  Providers  Ordering and Authorizing Provider: Meredeth Ide, MD NPI: [id number]  DEA #: [id number]  Ordering User:  Meredeth Ide, MD      Pharmacy  [facility] [place] [facility] [place], [place] - [address]  [address], [place] [address]  Phone:  [phone] Fax:  [phone]  DEA #:  --

## 2020-06-06 NOTE — Telephone Encounter (Signed)
Pt has been informed that he needs an appt to get a refill but he has enough through 12/2020 until he can schedule TOC appt with new provider, when he has money

## 2020-06-10 ENCOUNTER — Other Ambulatory Visit: Payer: Self-pay | Admitting: Family Medicine

## 2020-06-10 NOTE — Telephone Encounter (Signed)
Requested medication (s) are due for refill today: yes  Requested medication (s) are on the active medication list: yes  Last refill:  01/21/20 #30 11 refills   Future visit scheduled: visit scheduled with cardiology only not with PCP  Notes to clinic:  patient of  Dr. Collie Siad. Patient is 3 months overdue for refill.      Requested Prescriptions  Pending Prescriptions Disp Refills   clopidogrel (PLAVIX) 75 MG tablet [Pharmacy Med Name: Clopidogrel Bisulfate 75 MG Oral Tablet] 30 tablet 0    Sig: Take 1 tablet by mouth once daily      Hematology: Antiplatelets - clopidogrel Failed - 06/10/2020  4:33 PM      Failed - Evaluate AST, ALT within 2 months of therapy initiation.      Failed - AST in normal range and within 360 days    AST  Date Value Ref Range Status  01/18/2020 11 (L) 15 - 41 U/L Final          Failed - HCT in normal range and within 180 days    HCT  Date Value Ref Range Status  01/21/2020 33.2 (L) 39 - 52 % Final          Failed - HGB in normal range and within 180 days    Hemoglobin  Date Value Ref Range Status  01/21/2020 10.7 (L) 13.0 - 17.0 g/dL Final   Total hemoglobin  Date Value Ref Range Status  01/17/2020 12.8 12.0 - 16.0 g/dL Final          Failed - Valid encounter within last 6 months    Recent Outpatient Visits           9 months ago Uncontrolled type 2 diabetes mellitus with hyperglycemia [facility])   [facility] at [facility], Meda Coffee, MD   1 year ago Elevated serum creatinine   [facility] at [facility], Zoe A, MD   1 year ago Uncontrolled type 2 diabetes mellitus with hyperglycemia [facility])   Primary Care at [facility], Zoe A, MD   1 year ago Uncontrolled type 2 diabetes mellitus with hyperglycemia [facility]   [facility] at [facility], Marolyn Hammock, PA-C   2 years ago Impacted cerumen of left ear   Primary Care at [place], [place] D, [place]              Passed - ALT in normal range and within 360 days    ALT   Date Value Ref Range Status  01/18/2020 11 0 - 44 U/L Final          Passed - PLT in normal range and within 180 days    Platelets  Date Value Ref Range Status  01/21/2020 205 150 - 400 K/uL Final

## 2020-06-11 NOTE — Telephone Encounter (Signed)
06/11/2020 - PATIENT REQUESTING A REFILL ON HIS CLOPIDOGREL 75 mg. I TRIED TO CALL HIM TO SCHEDULE A TRANSFER OF CARE FROM DR. STALLINGS TO DR. Neva Seat BUT I HAD TO LEAVE A MESSAGE ON HIS VOICE MAIL TO RETURN MY CALL. FELICIA KIRBY SENT IN A 30 DAY SUPPLY. I WILL NOT ROUTE MESSAGE BACK TO THE CLINICAL TEAM UNTIL PATIENT CALLS BACK. MBC

## 2020-06-11 NOTE — Telephone Encounter (Signed)
Please schedule a TOC with another provider. No further refills without appt

## 2020-06-27 ENCOUNTER — Encounter: Payer: Self-pay | Admitting: Registered Nurse

## 2020-06-27 ENCOUNTER — Other Ambulatory Visit: Payer: Self-pay

## 2020-06-27 ENCOUNTER — Ambulatory Visit (INDEPENDENT_AMBULATORY_CARE_PROVIDER_SITE_OTHER): Payer: Self-pay | Admitting: Registered Nurse

## 2020-06-27 VITALS — BP 118/71 | HR 94 | Temp 97.5°F | Resp 18 | Ht 69.0 in | Wt 227.4 lb

## 2020-06-27 DIAGNOSIS — E1165 Type 2 diabetes mellitus with hyperglycemia: Secondary | ICD-10-CM

## 2020-06-27 DIAGNOSIS — I739 Peripheral vascular disease, unspecified: Secondary | ICD-10-CM

## 2020-06-27 DIAGNOSIS — I25810 Atherosclerosis of coronary artery bypass graft(s) without angina pectoris: Secondary | ICD-10-CM

## 2020-06-27 LAB — COMPREHENSIVE METABOLIC PANEL
ALT: 13 IU/L (ref 0–44)
AST: 13 IU/L (ref 0–40)
Albumin/Globulin Ratio: 1.5 (ref 1.2–2.2)
Albumin: 4.4 g/dL (ref 3.8–4.8)
Alkaline Phosphatase: 86 IU/L (ref 48–121)
BUN/Creatinine Ratio: 16 (ref 10–24)
BUN: 17 mg/dL (ref 8–27)
Bilirubin Total: 0.4 mg/dL (ref 0.0–1.2)
CO2: 21 mmol/L (ref 20–29)
Calcium: 9.2 mg/dL (ref 8.6–10.2)
Chloride: 101 mmol/L (ref 96–106)
Creatinine, Ser: 1.05 mg/dL (ref 0.76–1.27)
GFR calc Af Amer: 88 mL/min/{1.73_m2} (ref >59)
GFR calc non Af Amer: 76 mL/min/{1.73_m2} (ref >59)
Globulin, Total: 3 g/dL (ref 1.5–4.5)
Glucose: 338 mg/dL — ABNORMAL HIGH (ref 65–99)
Potassium: 4.6 mmol/L (ref 3.5–5.2)
Sodium: 137 mmol/L (ref 134–144)
Total Protein: 7.4 g/dL (ref 6.0–8.5)

## 2020-06-27 LAB — LIPID PANEL
Chol/HDL Ratio: 5.6 ratio — ABNORMAL HIGH (ref 0.0–5.0)
Cholesterol, Total: 151 mg/dL (ref 100–199)
HDL: 27 mg/dL — ABNORMAL LOW (ref >39)
LDL Chol Calc (NIH): 100 mg/dL — ABNORMAL HIGH (ref 0–99)
Triglycerides: 135 mg/dL (ref 0–149)
VLDL Cholesterol Cal: 24 mg/dL (ref 5–40)

## 2020-06-27 LAB — HEMOGLOBIN A1C
Est. average glucose Bld gHb Est-mCnc: 220 mg/dL
Hgb A1c MFr Bld: 9.3 % — ABNORMAL HIGH (ref 4.8–5.6)

## 2020-06-27 MED ORDER — APIXABAN 5 MG PO TABS: 5.0000 mg | ORAL_TABLET | Freq: Two times a day (BID) | ORAL | 2 refills | Status: DC

## 2020-06-27 MED ORDER — ATORVASTATIN CALCIUM 40 MG PO TABS: 40.0000 mg | ORAL_TABLET | Freq: Every day | ORAL | 3 refills | Status: DC

## 2020-06-27 MED ORDER — GLIPIZIDE 10 MG PO TABS: 10.0000 mg | ORAL_TABLET | Freq: Two times a day (BID) | ORAL | 3 refills | Status: DC

## 2020-06-27 MED ORDER — FUROSEMIDE 40 MG PO TABS: 40.0000 mg | ORAL_TABLET | Freq: Every day | ORAL | 5 refills | Status: DC

## 2020-06-27 MED ORDER — CARVEDILOL 6.25 MG PO TABS: 6.2500 mg | ORAL_TABLET | Freq: Two times a day (BID) | ORAL | 5 refills | Status: DC

## 2020-06-27 MED ORDER — POTASSIUM CHLORIDE CRYS ER 20 MEQ PO TBCR: 20.0000 meq | EXTENDED_RELEASE_TABLET | Freq: Every day | ORAL | 5 refills | Status: DC

## 2020-06-27 MED ORDER — METFORMIN HCL 1000 MG PO TABS: 1000.0000 mg | ORAL_TABLET | Freq: Two times a day (BID) | ORAL | 3 refills | Status: DC

## 2020-06-27 MED ORDER — CLOPIDOGREL BISULFATE 75 MG PO TABS: 75.0000 mg | ORAL_TABLET | Freq: Every day | ORAL | 3 refills | Status: DC

## 2020-06-27 NOTE — Patient Instructions (Signed)
° ° ° °  If you have lab work done today you will be contacted with your lab results within the next 2 weeks.  If you have not heard from us then please contact us. The fastest way to get your results is to register for My Chart. ° ° °IF you received an x-ray today, you will receive an invoice from [facility]. Please contact [facility] at [phone] with questions or concerns regarding your invoice.  ° °IF you received labwork today, you will receive an invoice from [facility]. Please contact [facility] at [phone] with questions or concerns regarding your invoice.  ° °Our billing staff will not be able to assist you with questions regarding bills from these companies. ° °You will be contacted with the lab results as soon as they are available. The fastest way to get your results is to activate your My Chart account. Instructions are located on the last page of this paperwork. If you have not heard from us regarding the results in 2 weeks, please contact this office. °  ° ° ° °

## 2020-06-27 NOTE — Progress Notes (Signed)
Established Patient Office Visit  Subjective:  Patient ID: Robert Sanchez, male    DOB: 1956/04/14  Age: 64 y.o. MRN: [id number]  CC:  Chief Complaint  Patient presents with  . New Patient (Initial Visit)    Patient states he is here to establish care with new provider. Also a medication refill.  . Medication Refill    patient would discuss    HPI [name] presents for visit to est care.  Formerly est with Dr. Creta Levin Extensive CV history as detailed below, at this time, largest concern and affect on daily life is PAD. Multiple DVTs, has bypass in LLE that was recently thrombolysis. Was started on eliquis in the hospital but can no longer afford. He is taking plavix  PO qd and aspirin  PO qd. Discussed that both of these are antiplatelet and may provide undesirable effect. Will discuss with Dr. Chestine Spore, his vascular specialist, if any programs are available to make eliquis more affordable.   Has run out of most medications as of around 2 weeks ago. No chest pain,shob, doe, headaches, visual changes. Has not been checking sugars - admits that lifestyle is not optimal for t2dm, htn, cad, pad, and his other chronic conditions.   Past Medical History:  Diagnosis Date  . Acute pulmonary edema (HCC) 06/05/2018  . Arthritis   . COPD (chronic obstructive pulmonary disease) (HCC)    " beginning stages " per pt  . Coronary artery disease   . Diabetes mellitus without complication (HCC)   . Diabetic neuropathy (HCC)   . Diabetic neuropathy (HCC)   . Emphysema/COPD (HCC)    " beginning stages"  . GERD (gastroesophageal reflux disease)   . HOH (hard of hearing)   . Myocardial infarction (HCC)   . Peripheral vascular disease [facility])     Past Surgical History:  Procedure Laterality Date  . ABDOMINAL AORTOGRAM W/LOWER EXTREMITY N/A 06/21/2018   Procedure: ABDOMINAL AORTOGRAM W/LOWER EXTREMITY;  Surgeon: Cephus Shelling, MD;  [facility] INV[facility];  Service:  Cardiovascular;  Laterality: N/A;  . ABDOMINAL AORTOGRAM W/LOWER EXTREMITY Left 01/16/2020   Procedure: ABDOMINAL AORTOGRAM W/LOWER EXTREMITY;  Surgeon: Cephus Shelling, MD;  [facility]: [facility];  Service: Cardiovascular;  Laterality: Left;  . CORONARY ARTERY BYPASS GRAFT N/A 06/12/2018   Procedure: CORONARY ARTERY BYPASS GRAFTING (CABG) x 3 WITH ENDOSCOPIC HARVESTING OF RIGHT GREATER SAPHENOUS VEIN. LIMA TO LAD. SVG TO PD. SVG TO DIAGONAL.;  Surgeon: Kerin Perna, MD;  Location: [facility] OR;  Service: Open Heart Surgery;  Laterality: N/A;  . FEMORAL-TIBIAL BYPASS GRAFT Left 08/21/2018   Procedure: BYPASS GRAFT LEFT FEMORAL TO POSTERIOR TIBIAL ARTERY USING LEFT REVERSED GREAT SAPHENOUS VEIN;  Surgeon: Cephus Shelling, MD;  [facility] OR;  Service: Vascular;  Laterality: Left;  . KNEE ARTHROSCOPY    . LOWER EXTREMITY ANGIOGRAPHY Left 01/17/2020   Procedure: Lower Extremity Angiography;  Surgeon: Cephus Shelling, MD;  Location: [facility] INVASIVE CV LAB;  Service: Cardiovascular;  Laterality: Left;  . PERIPHERAL VASCULAR BALLOON ANGIOPLASTY Left 01/17/2020   Procedure: PERIPHERAL VASCULAR BALLOON ANGIOPLASTY;  Surgeon: Cephus Shelling, MD;  [facility];  Service: Cardiovascular;  Laterality: Left;  pt  . PERIPHERAL VASCULAR INTERVENTION Left 06/21/2018   Procedure: PERIPHERAL VASCULAR INTERVENTION;  Surgeon: Cephus Shelling, MD;  Location: [facility] INVASIVE CV LAB;  Service: Cardiovascular;  Laterality: Left;  . PERIPHERAL VASCULAR THROMBECTOMY Left 01/16/2020   Procedure: PERIPHERAL VASCULAR THROMBECTOMY;  Surgeon: Chestine Spore,  Canary Brim, MD;  [facility];  Service: Cardiovascular;  Laterality: Left;  Lytic Catheter Placement left fem-pop bypass  . PERIPHERAL VASCULAR THROMBECTOMY Left 01/17/2020   Procedure: PERIPHERAL VASCULAR THROMBECTOMY;  Surgeon: Cephus Shelling, MD;  [facility];  Service: Cardiovascular;  Laterality: Left;   fem-pt bypass  . RIGHT/LEFT HEART CATH AND CORONARY ANGIOGRAPHY N/A 06/05/2018   Procedure: RIGHT/LEFT HEART CATH AND CORONARY ANGIOGRAPHY;  Surgeon: Orpah Cobb, MD;  [facility];  Service: Cardiovascular;  Laterality: N/A;  . SPINE SURGERY    . TEE WITHOUT CARDIOVERSION N/A 06/12/2018   Procedure: TRANSESOPHAGEAL ECHOCARDIOGRAM (TEE);  Surgeon: Donata Clay, Theron Arista, MD;  Location: [facility] OR;  Service: Open Heart Surgery;  Laterality: N/A;  . teeth extractions    . VEIN HARVEST Left 08/21/2018   Procedure: VEIN HARVEST LEFT GREAT SAPHENOUS VEIN;  Surgeon: Cephus Shelling, MD;  Location: [facility] OR;  Service: Vascular;  Laterality: Left;    Family History  Problem Relation Age of Onset  . Diabetes Mother   . Lung disease Mother   . Cancer Father   . Heart disease Father     Social History   Socioeconomic History  . Marital status: Single    Spouse name: Not on file  . Number of children: Not on file  . Years of education: Not on file  . Highest education level: Not on file  Occupational History  . Not on file  Tobacco Use  . Smoking status: Former Smoker    Packs/day: 1.00    Years: 40.00    Pack years: 40.00    Quit date: 06/02/2018    Years since quitting: 2.0  . Smokeless tobacco: Never Used  Vaping Use  . Vaping Use: Never used  Substance and Sexual Activity  . Alcohol use: Not Currently    Comment: quit  . Drug use: Not Currently  . Sexual activity: Not on file  Other Topics Concern  . Not on file  Social History Narrative  . Not on file   Social Determinants of Health   Financial Resource Strain:   . Difficulty of Paying Living Expenses: Not on file  Food Insecurity:   . Worried About Programme researcher, broadcasting/film/video in the Last Year: Not on file  . Ran Out of Food in the Last Year: Not on file  Transportation Needs:   . Lack of Transportation (Medical): Not on file  . Lack of Transportation (Non-Medical): Not on file  Physical Activity:   . Days of Exercise  per Week: Not on file  . Minutes of Exercise per Session: Not on file  Stress:   . Feeling of Stress : Not on file  Social Connections:   . Frequency of Communication with Friends and Family: Not on file  . Frequency of Social Gatherings with Friends and Family: Not on file  . Attends Religious Services: Not on file  . Active Member of Clubs or Organizations: Not on file  . Attends Banker Meetings: Not on file  . Marital Status: Not on file  Intimate Partner Violence:   . Fear of Current or Ex-Partner: Not on file  . Emotionally Abused: Not on file  . Physically Abused: Not on file  . Sexually Abused: Not on file    Outpatient Medications Prior to Visit  Medication Sig Dispense Refill  . atorvastatin (LIPITOR) 40 MG tablet Take 40 mg by mouth at bedtime.    . carvedilol (  COREG) 6.25 MG tablet Take 1 tablet (6.25 mg total) by mouth 2 (two) times daily with a meal. (Patient taking differently: Take 6.25 mg by mouth daily. ) 60 tablet 5  . clopidogrel (PLAVIX) 75 MG tablet Take 1 tablet by mouth once daily 30 tablet 0  . furosemide (LASIX) 40 MG tablet Take 1 tablet (40 mg total) by mouth daily. 30 tablet 5  . glipiZIDE (GLUCOTROL) 5 MG tablet Take 1 tablet (5 mg total) by mouth 2 (two) times daily before a meal. 60 tablet 5  . metFORMIN (GLUCOPHAGE) 1000 MG tablet Take 1 tablet (1,000 mg total) by mouth 2 (two) times daily with a meal. 60 tablet 5  . potassium chloride SA (KLOR-CON) 20 MEQ tablet Take 1 tablet (20 mEq total) by mouth daily. 30 tablet 5  . apixaban (ELIQUIS) 5 MG TABS tablet Take 1 tablet (5 mg total) by mouth 2 (two) times daily. (Patient not taking: Reported on 06/27/2020) 60 tablet 2  . oxyCODONE-acetaminophen (PERCOCET/ROXICET) 5-325 MG tablet Take 1 tablet by mouth every 6 (six) hours as needed for moderate pain. (Patient not taking: Reported on 05/06/2020) 20 tablet 0   No facility-administered medications prior to visit.    Allergies  Allergen  Reactions  . Lisinopril Other (See Comments)    Syncope  . Codeine Rash    ROS Review of Systems  Constitutional: Negative.   HENT: Negative.   Eyes: Negative.   Respiratory: Negative.   Cardiovascular: Positive for leg swelling. Negative for chest pain and palpitations.  Gastrointestinal: Negative.   Endocrine: Negative.   Genitourinary: Negative.   Musculoskeletal: Negative.   Skin: Negative.   Allergic/Immunologic: Negative.   Neurological: Negative.   Hematological: Negative.   Psychiatric/Behavioral: Negative.   All other systems reviewed and are negative.     Objective:    Physical Exam Vitals and nursing note reviewed.  Constitutional:      Appearance: Normal appearance. He is normal weight.  Cardiovascular:     Rate and Rhythm: Normal rate and regular rhythm.  Musculoskeletal:        General: Tenderness present.     Right lower leg: Edema present.     Left lower leg: Edema present.  Skin:    General: Skin is warm and dry.     Capillary Refill: Capillary refill takes 2 to 3 seconds.     Coloration: Skin is not jaundiced or pale.     Findings: No bruising, erythema, lesion or rash.  Neurological:     General: No focal deficit present.     Mental Status: He is alert and oriented to person, place, and time. Mental status is at baseline.  Psychiatric:        Mood and Affect: Mood normal.        Behavior: Behavior normal.        Thought Content: Thought content normal.        Judgment: Judgment normal.    Area of necrotic tissue is extremely well defined. Pink granular tissue visible around margins. No clear evidence of infection or spreading necrosis. Pt has been monitoring - no sudden changes, no new pain or drainage. Has been cleaning area daily. Regular foot checks, no new wounds.    BP 118/71   Pulse 94   Temp (!) 97.5 F (36.4 C) (Temporal)   Resp 18   Ht 5\' 9"  (1.753 m)   Wt 227 lb 6.4 oz (103.1 kg)   SpO2 96%   BMI 33.58 kg/m  Wt Readings  from Last 3 Encounters:  06/27/20 227 lb 6.4 oz (103.1 kg)  05/06/20 229 lb (103.9 kg)  04/08/20 228 lb (103.4 kg)     Health Maintenance Due  Topic Date Due  . INFLUENZA VACCINE  06/01/2020    There are no preventive care reminders to display for this patient.  Lab Results  Component Value Date   TSH 3.719 06/10/2018   Lab Results  Component Value Date   WBC 9.8 01/21/2020   HGB 10.7 (L) 01/21/2020   HCT 33.2 (L) 01/21/2020   MCV 94.1 01/21/2020   PLT 205 01/21/2020   Lab Results  Component Value Date   NA 135 01/19/2020   K 4.1 01/19/2020   CO2 19 (L) 01/19/2020   GLUCOSE 153 (H) 01/19/2020   BUN 17 01/19/2020   CREATININE 1.38 (H) 01/19/2020   BILITOT 1.2 01/18/2020   ALKPHOS 44 01/18/2020   AST 11 (L) 01/18/2020   ALT 11 01/18/2020   PROT 6.8 01/18/2020   ALBUMIN 3.0 (L) 01/18/2020   CALCIUM 8.6 (L) 01/19/2020   ANIONGAP 11 01/19/2020   Lab Results  Component Value Date   CHOL 165 09/14/2019   Lab Results  Component Value Date   HDL 28 (L) 09/14/2019   Lab Results  Component Value Date   LDLCALC 114 (H) 09/14/2019   Lab Results  Component Value Date   TRIG 127 09/14/2019   Lab Results  Component Value Date   CHOLHDL 5.9 (H) 09/14/2019   Lab Results  Component Value Date   HGBA1C 10.0 (H) 01/15/2020      Assessment & Plan:   Problem List Items Addressed This Visit      Cardiovascular and Mediastinum   CAD (coronary artery disease)   Relevant Medications   apixaban (ELIQUIS) 5 MG TABS tablet   atorvastatin (LIPITOR) 40 MG tablet   carvedilol (COREG) 6.25 MG tablet   furosemide (LASIX) 40 MG tablet   PAD (peripheral artery disease) (HCC)   Relevant Medications   apixaban (ELIQUIS) 5 MG TABS tablet   atorvastatin (LIPITOR) 40 MG tablet   carvedilol (COREG) 6.25 MG tablet   clopidogrel (PLAVIX) 75 MG tablet   furosemide (LASIX) 40 MG tablet   potassium chloride SA (KLOR-CON) 20 MEQ tablet    Other Visit Diagnoses    Uncontrolled  type 2 diabetes mellitus with hyperglycemia (HCC)    -  Primary   Relevant Medications   atorvastatin (LIPITOR) 40 MG tablet   glipiZIDE (GLUCOTROL) 10 MG tablet   metFORMIN (GLUCOPHAGE) 1000 MG tablet   Other Relevant Orders   Hemoglobin A1c   Lipid panel   Comprehensive metabolic panel      Meds ordered this encounter  Medications  . apixaban (ELIQUIS) 5 MG TABS tablet    Sig: Take 1 tablet (5 mg total) by mouth 2 (two) times daily.    Dispense:  60 tablet    Refill:  2    Order Specific Question:   Supervising Provider    Answer:   Neva Seat, JEFFREY R [[id number]]  . atorvastatin (LIPITOR) 40 MG tablet    Sig: Take 1 tablet (40 mg total) by mouth at bedtime.    Dispense:  90 tablet    Refill:  3    Order Specific Question:   Supervising Provider    Answer:   Neva Seat, JEFFREY R [[id number]]  . carvedilol (COREG) 6.25 MG tablet    Sig: Take 1 tablet (6.25 mg total) by mouth 2 (two)  times daily with a meal.    Dispense:  60 tablet    Refill:  5    Order Specific Question:   Supervising Provider    Answer:   Neva Seat, JEFFREY R [[id number]]  . clopidogrel (PLAVIX) 75 MG tablet    Sig: Take 1 tablet (75 mg total) by mouth daily.    Dispense:  90 tablet    Refill:  3    Order Specific Question:   Supervising Provider    Answer:   Neva Seat, JEFFREY R [[id number]]  . furosemide (LASIX) 40 MG tablet    Sig: Take 1 tablet (40 mg total) by mouth daily.    Dispense:  30 tablet    Refill:  5    Order Specific Question:   Supervising Provider    Answer:   Neva Seat, JEFFREY R [[id number]]  . glipiZIDE (GLUCOTROL) 10 MG tablet    Sig: Take 1 tablet (10 mg total) by mouth 2 (two) times daily before a meal.    Dispense:  180 tablet    Refill:  3    Order Specific Question:   Supervising Provider    Answer:   Neva Seat, JEFFREY R [[id number]]  . metFORMIN (GLUCOPHAGE) 1000 MG tablet    Sig: Take 1 tablet (1,000 mg total) by mouth 2 (two) times daily with a meal.    Dispense:  180 tablet    Refill:  3    Order Specific  Question:   Supervising Provider    Answer:   Neva Seat, JEFFREY R [[id number]]  . potassium chloride SA (KLOR-CON) 20 MEQ tablet    Sig: Take 1 tablet (20 mEq total) by mouth daily.    Dispense:  30 tablet    Refill:  5    Order Specific Question:   Supervising Provider    Answer:   Neva Seat, JEFFREY R [[id number]]    Follow-up: No follow-ups on file.   PLAN  Limited in our care due to patient's financial restrictions.  Will refill meds, will CC dr. Chestine Spore on today's visit to ask about any programs to help cover cost of eliquis.  Offered gabapentin for pain from neuropathies and PAD, pt declines  Labs collected, will follow up as warranted  Patient encouraged to call clinic with any questions, comments, or concerns.  Janeece Agee, NP

## 2020-07-08 ENCOUNTER — Ambulatory Visit (INDEPENDENT_AMBULATORY_CARE_PROVIDER_SITE_OTHER)
Admission: RE | Admit: 2020-07-08 | Discharge: 2020-07-08 | Disposition: A | Discharge status: Home / Self Care | Payer: Self-pay | Source: Ambulatory Visit | Attending: Vascular Surgery | Admitting: Vascular Surgery

## 2020-07-08 ENCOUNTER — Ambulatory Visit (HOSPITAL_COMMUNITY)
Admission: RE | Admit: 2020-07-08 | Discharge: 2020-07-08 | Disposition: A | Discharge status: Home / Self Care | Payer: Self-pay | Source: Ambulatory Visit | Attending: Vascular Surgery | Admitting: Vascular Surgery

## 2020-07-08 ENCOUNTER — Ambulatory Visit (INDEPENDENT_AMBULATORY_CARE_PROVIDER_SITE_OTHER): Payer: Self-pay | Admitting: Vascular Surgery

## 2020-07-08 ENCOUNTER — Encounter: Payer: Self-pay | Admitting: Vascular Surgery

## 2020-07-08 ENCOUNTER — Other Ambulatory Visit: Payer: Self-pay

## 2020-07-08 ENCOUNTER — Other Ambulatory Visit (HOSPITAL_COMMUNITY)
Admission: RE | Admit: 2020-07-08 | Discharge: 2020-07-08 | Disposition: A | Discharge status: Home / Self Care | Payer: Self-pay | Source: Ambulatory Visit | Attending: Vascular Surgery | Admitting: Vascular Surgery

## 2020-07-08 VITALS — BP 134/70 | HR 76 | Temp 96.3°F | Resp 18 | Ht 69.0 in | Wt 221.0 lb

## 2020-07-08 DIAGNOSIS — I739 Peripheral vascular disease, unspecified: Secondary | ICD-10-CM

## 2020-07-08 DIAGNOSIS — I998 Other disorder of circulatory system: Secondary | ICD-10-CM

## 2020-07-08 DIAGNOSIS — Z9889 Other specified postprocedural states: Secondary | ICD-10-CM

## 2020-07-08 DIAGNOSIS — Z20822 Contact with and (suspected) exposure to covid-19: Secondary | ICD-10-CM | POA: Insufficient documentation

## 2020-07-08 DIAGNOSIS — Z01812 Encounter for preprocedural laboratory examination: Secondary | ICD-10-CM | POA: Insufficient documentation

## 2020-07-08 DIAGNOSIS — Z959 Presence of cardiac and vascular implant and graft, unspecified: Secondary | ICD-10-CM

## 2020-07-08 NOTE — Progress Notes (Signed)
If we could call patient -  Diabetes is improving mildly to an a1c of 9.3. It's important he stays aggressive with his control of diet and routine exercise. Next step would be starting insulin and I'd prefer to avoid that.   Otherwise labs are steady. Glad he's been seen by vascular.  Thanks,  Jari Sportsman, NP

## 2020-07-08 NOTE — Progress Notes (Signed)
Patient name: Robert Sanchez MRN: [id number] DOB: 1956-05-10 Sex: male  REASON FOR VISIT: 2 month follow-up wound check left great toe  HPI: Robert Sanchez is a 64 y.o. male with history of left common femoral to PT bypass with GSV on 08/21/2018 for CLI with rest pain.  Ultimately he presented to the hospital earlier this year with an occluded bypass and underwent thrombolysis and subsequent percutaneous mechanical thrombectomy with angioplasty of the bypass and PT on 01/16/20- 01/17/2020.  Ultimately his bypass was salvaged and he was discharged on Eliquis.  During this event he did develop necrotic left great toe that we have been watching.  He has wanted to avoid amputation and we have been closely following this wound.  On follow-up today he has not been taking his Eliquis as he states this is too expensive.  States he is still taking the Plavix.  States he is not having any trouble with his foot including no rest pain.  Feels the toe ulcer on the left great toe is stable.  He is back at work.  Past Medical History:  Diagnosis Date  . Acute pulmonary edema (HCC) 06/05/2018  . Arthritis   . COPD (chronic obstructive pulmonary disease) (HCC)    " beginning stages " per pt  . Coronary artery disease   . Diabetes mellitus without complication (HCC)   . Diabetic neuropathy (HCC)   . Diabetic neuropathy (HCC)   . Emphysema/COPD (HCC)    " beginning stages"  . GERD (gastroesophageal reflux disease)   . HOH (hard of hearing)   . Myocardial infarction (HCC)   . Peripheral vascular disease [facility])     Past Surgical History:  Procedure Laterality Date  . ABDOMINAL AORTOGRAM W/LOWER EXTREMITY N/A 06/21/2018   Procedure: ABDOMINAL AORTOGRAM W/LOWER EXTREMITY;  Surgeon: Cephus Shelling, MD;  [facility] INVASIVE CV LAB;  Service: Cardiovascular;  Laterality: N/A;  . ABDOMINAL AORTOGRAM W/LOWER EXTREMITY Left 01/16/2020   Procedure: ABDOMINAL AORTOGRAM W/LOWER EXTREMITY;  Surgeon: Cephus Shelling, MD;  [facility] INVASIVE CV LAB;  Service: Cardiovascular;  Laterality: Left;  . CORONARY ARTERY BYPASS GRAFT N/A 06/12/2018   Procedure: CORONARY ARTERY BYPASS GRAFTING (CABG) x 3 WITH ENDOSCOPIC HARVESTING OF RIGHT GREATER SAPHENOUS VEIN. LIMA TO LAD. SVG TO PD. SVG TO DIAGONAL.;  Surgeon: Kerin Perna, MD;  Location: [facility] OR;  Service: Open Heart Surgery;  Laterality: N/A;  . FEMORAL-TIBIAL BYPASS GRAFT Left 08/21/2018   Procedure: BYPASS GRAFT LEFT FEMORAL TO POSTERIOR TIBIAL ARTERY USING LEFT REVERSED GREAT SAPHENOUS VEIN;  Surgeon: Cephus Shelling, MD;  [facility]: [facility] OR;  Service: Vascular;  Laterality: Left;  . KNEE ARTHROSCOPY    . LOWER EXTREMITY ANGIOGRAPHY Left 01/17/2020   Procedure: Lower Extremity Angiography;  Surgeon: Cephus Shelling, MD;  Location: [facility] INVASIVE CV LAB;  Service: Cardiovascular;  Laterality: Left;  . PERIPHERAL VASCULAR BALLOON ANGIOPLASTY Left 01/17/2020   Procedure: PERIPHERAL VASCULAR BALLOON ANGIOPLASTY;  Surgeon: Cephus Shelling, MD;  [facility] LAB;  Service: Cardiovascular;  Laterality: Left;  pt  . PERIPHERAL VASCULAR INTERVENTION Left 06/21/2018   Procedure: PERIPHERAL VASCULAR INTERVENTION;  Surgeon: Cephus Shelling, MD;  Location: [facility] INVASIVE CV LAB;  Service: Cardiovascular;  Laterality: Left;  . PERIPHERAL VASCULAR THROMBECTOMY Left 01/16/2020   Procedure: PERIPHERAL VASCULAR THROMBECTOMY;  Surgeon: Cephus Shelling, MD;  [facility] LAB;  Service: Cardiovascular;  Laterality: Left;  Lytic Catheter Placement left fem-pop bypass  . PERIPHERAL VASCULAR THROMBECTOMY  Left 01/17/2020   Procedure: PERIPHERAL VASCULAR THROMBECTOMY;  Surgeon: Cephus Shelling, MD;  Location: [facility] INVASIVE CV LAB;  Service: Cardiovascular;  Laterality: Left;  fem-pt bypass  . RIGHT/LEFT HEART CATH AND CORONARY ANGIOGRAPHY N/A 06/05/2018   Procedure: RIGHT/LEFT HEART CATH AND CORONARY ANGIOGRAPHY;  Surgeon: Orpah Cobb, MD;  [facility] LAB;  Service: Cardiovascular;  Laterality: N/A;  . SPINE SURGERY    . TEE WITHOUT CARDIOVERSION N/A 06/12/2018   Procedure: TRANSESOPHAGEAL ECHOCARDIOGRAM (TEE);  Surgeon: Donata Clay, Theron Arista, MD;  Location: [facility] OR;  Service: Open Heart Surgery;  Laterality: N/A;  . teeth extractions    . VEIN HARVEST Left 08/21/2018   Procedure: VEIN HARVEST LEFT GREAT SAPHENOUS VEIN;  Surgeon: Cephus Shelling, MD;  Location: [facility] OR;  Service: Vascular;  Laterality: Left;    Family History  Problem Relation Age of Onset  . Diabetes Mother   . Lung disease Mother   . Cancer Father   . Heart disease Father     SOCIAL HISTORY: Social History   Tobacco Use  . Smoking status: Former Smoker    Packs/day: 1.00    Years: 40.00    Pack years: 40.00    Quit date: 06/02/2018    Years since quitting: 2.1  . Smokeless tobacco: Never Used  Substance Use Topics  . Alcohol use: Not Currently    Comment: quit    Allergies  Allergen Reactions  . Lisinopril Other (See Comments)    Syncope  . Codeine Rash    Current Outpatient Medications  Medication Sig Dispense Refill  . apixaban (ELIQUIS) 5 MG TABS tablet Take 1 tablet (5 mg total) by mouth 2 (two) times daily. 60 tablet 2  . atorvastatin (LIPITOR) 40 MG tablet Take 1 tablet (40 mg total) by mouth at bedtime. 90 tablet 3  . carvedilol (COREG) 6.25 MG tablet Take 1 tablet (6.25 mg total) by mouth 2 (two) times daily with a meal. 60 tablet 5  . clopidogrel (PLAVIX) 75 MG tablet Take 1 tablet (75 mg total) by mouth daily. 90 tablet 3  . furosemide (LASIX) 40 MG tablet Take 1 tablet (40 mg total) by mouth daily. 30 tablet 5  . glipiZIDE (GLUCOTROL) 10 MG tablet Take 1 tablet (10 mg total) by mouth 2 (two) times daily before a meal. 180 tablet 3  . metFORMIN (GLUCOPHAGE) 1000 MG tablet Take 1 tablet (1,000 mg total) by mouth 2 (two) times daily with a meal. 180 tablet 3  . potassium chloride SA (KLOR-CON) 20 MEQ tablet  Take 1 tablet (20 mEq total) by mouth daily. 30 tablet 5   No current facility-administered medications for this visit.    REVIEW OF SYSTEMS:  [X]  denotes positive finding, [ ]  denotes negative finding Cardiac  Comments:  Chest pain or chest pressure:    Shortness of breath upon exertion:    Short of breath when lying flat:    Irregular heart rhythm:        Vascular    Pain in calf, thigh, or hip brought on by ambulation:    Pain in feet at night that wakes you up from your sleep:     Blood clot in your veins:    Leg swelling:         Pulmonary    Oxygen at home:    Productive cough:     Wheezing:         Neurologic    Sudden weakness in arms or legs:  Sudden numbness in arms or legs:     Sudden onset of difficulty speaking or slurred speech:    Temporary loss of vision in one eye:     Problems with dizziness:         Gastrointestinal    Blood in stool:     Vomited blood:         Genitourinary    Burning when urinating:     Blood in urine:        Psychiatric    Major depression:         Hematologic    Bleeding problems:    Problems with blood clotting too easily:        Skin    Rashes or ulcers:        Constitutional    Fever or chills:      PHYSICAL EXAM: Vitals:   07/08/20 0846  BP: 134/70  Pulse: 76  Resp: 18  Temp: (!) 96.3 F (35.7 C)  TempSrc: Temporal  SpO2: 98%  Weight: 221 lb (100.2 kg)  Height: 5\' 9"  (1.753 m)    GENERAL: The patient is a well-nourished male, in no acute distress. The vital signs are documented above. CARDIAC: There is a regular rate and rhythm.  VASCULAR:  Left PT monophasic signal.  I think I can get a signal in the bypass but difficult to visualize on duplex today. Left foot warm Left great toe tip pictured below after debridement in clinic.         DATA:   ABIs on the left are 0.48 monophasic and on left leg arterial duplex difficult to visualize bypass and suspect  occluded.  Assessment/Plan:  64 year old male status post thrombolysis and subsequent percutaneous mechanical thrombectomy of left common femoral to PT bypass on 01/16/20-01/17/2020 after his bypass occluded.  He presents for continued follow-up of his left great toe wound that he developed after his bypass occluded back in March.  Difficult to visualize flow in the bypass today and suspect this may be occluded.  He has been medically noncompliant and has not been taking his Eliquis as we have instructed for high risk bypass failure.  He is not having any severe rest pain or progression of his wound like he had in the past when his bypass previously occluded.  I recommended a left leg arteriogram possible intervention and possible thrombolysis.  I will try and do this tomorrow given unclear to me the timeline of his bypasses occlusion and this may not be salvageable as I discussed with him today.  He is at high risk for limb loss.  Risks and benefits discussed in detail.  April, MD [facility] Office: [phone]

## 2020-07-09 ENCOUNTER — Inpatient Hospital Stay (HOSPITAL_COMMUNITY)
Admission: RE | Admit: 2020-07-09 | Discharge: 2020-07-14 | DRG: 271 | Disposition: A | Discharge status: Home / Self Care | Payer: Self-pay | Attending: Vascular Surgery | Admitting: Vascular Surgery

## 2020-07-09 ENCOUNTER — Encounter (HOSPITAL_COMMUNITY): Payer: Self-pay | Admitting: Vascular Surgery

## 2020-07-09 ENCOUNTER — Encounter (HOSPITAL_COMMUNITY)
Admission: RE | Disposition: A | Discharge status: Home / Self Care | Payer: Self-pay | Source: Home / Self Care | Attending: Vascular Surgery

## 2020-07-09 DIAGNOSIS — E1151 Type 2 diabetes mellitus with diabetic peripheral angiopathy without gangrene: Secondary | ICD-10-CM | POA: Diagnosis present

## 2020-07-09 DIAGNOSIS — Y832 Surgical operation with anastomosis, bypass or graft as the cause of abnormal reaction of the patient, or of later complication, without mention of misadventure at the time of the procedure: Secondary | ICD-10-CM | POA: Diagnosis present

## 2020-07-09 DIAGNOSIS — I252 Old myocardial infarction: Secondary | ICD-10-CM

## 2020-07-09 DIAGNOSIS — E785 Hyperlipidemia, unspecified: Secondary | ICD-10-CM | POA: Diagnosis present

## 2020-07-09 DIAGNOSIS — I743 Embolism and thrombosis of arteries of the lower extremities: Secondary | ICD-10-CM

## 2020-07-09 DIAGNOSIS — I2510 Atherosclerotic heart disease of native coronary artery without angina pectoris: Secondary | ICD-10-CM | POA: Diagnosis present

## 2020-07-09 DIAGNOSIS — T45516A Underdosing of anticoagulants, initial encounter: Secondary | ICD-10-CM | POA: Diagnosis present

## 2020-07-09 DIAGNOSIS — E11621 Type 2 diabetes mellitus with foot ulcer: Secondary | ICD-10-CM | POA: Diagnosis present

## 2020-07-09 DIAGNOSIS — I739 Peripheral vascular disease, unspecified: Secondary | ICD-10-CM | POA: Diagnosis present

## 2020-07-09 DIAGNOSIS — J449 Chronic obstructive pulmonary disease, unspecified: Secondary | ICD-10-CM | POA: Diagnosis present

## 2020-07-09 DIAGNOSIS — L97529 Non-pressure chronic ulcer of other part of left foot with unspecified severity: Secondary | ICD-10-CM | POA: Diagnosis present

## 2020-07-09 DIAGNOSIS — Z833 Family history of diabetes mellitus: Secondary | ICD-10-CM

## 2020-07-09 DIAGNOSIS — T82868A Thrombosis of vascular prosthetic devices, implants and grafts, initial encounter: Principal | ICD-10-CM | POA: Diagnosis present

## 2020-07-09 DIAGNOSIS — Z91120 Patient's intentional underdosing of medication regimen due to financial hardship: Secondary | ICD-10-CM

## 2020-07-09 DIAGNOSIS — Z7984 Long term (current) use of oral hypoglycemic drugs: Secondary | ICD-10-CM

## 2020-07-09 DIAGNOSIS — I70222 Atherosclerosis of native arteries of extremities with rest pain, left leg: Secondary | ICD-10-CM | POA: Diagnosis present

## 2020-07-09 DIAGNOSIS — Z8249 Family history of ischemic heart disease and other diseases of the circulatory system: Secondary | ICD-10-CM

## 2020-07-09 DIAGNOSIS — Z7902 Long term (current) use of antithrombotics/antiplatelets: Secondary | ICD-10-CM

## 2020-07-09 DIAGNOSIS — Z87891 Personal history of nicotine dependence: Secondary | ICD-10-CM

## 2020-07-09 HISTORY — PX: ABDOMINAL AORTOGRAM W/LOWER EXTREMITY: CATH118223

## 2020-07-09 LAB — CBC
HCT: 42.1 % (ref 39.0–52.0)
HCT: 39.9 % (ref 39.0–52.0)
Hemoglobin: 13.1 g/dL (ref 13.0–17.0)
Hemoglobin: 12.7 g/dL — ABNORMAL LOW (ref 13.0–17.0)
MCH: 28.8 pg (ref 26.0–34.0)
MCH: 29.1 pg (ref 26.0–34.0)
MCHC: 31.1 g/dL (ref 30.0–36.0)
MCHC: 31.8 g/dL (ref 30.0–36.0)
MCV: 92.5 fL (ref 80.0–100.0)
MCV: 91.3 fL (ref 80.0–100.0)
Platelets: 288 10*3/uL (ref 150–400)
Platelets: 272 10*3/uL (ref 150–400)
RBC: 4.55 MIL/uL (ref 4.22–5.81)
RBC: 4.37 MIL/uL (ref 4.22–5.81)
RDW: 14.2 % (ref 11.5–15.5)
RDW: 14.2 % (ref 11.5–15.5)
WBC: 8.6 10*3/uL (ref 4.0–10.5)
WBC: 9.1 10*3/uL (ref 4.0–10.5)
nRBC: 0 % (ref 0.0–0.2)
nRBC: 0 % (ref 0.0–0.2)

## 2020-07-09 LAB — POCT I-STAT, CHEM 8
BUN: 26 mg/dL — ABNORMAL HIGH (ref 8–23)
Calcium, Ion: 1.28 mmol/L (ref 1.15–1.40)
Chloride: 105 mmol/L (ref 98–111)
Creatinine, Ser: 1.1 mg/dL (ref 0.61–1.24)
Glucose, Bld: 169 mg/dL — ABNORMAL HIGH (ref 70–99)
HCT: 39 % (ref 39.0–52.0)
Hemoglobin: 13.3 g/dL (ref 13.0–17.0)
Potassium: 4.4 mmol/L (ref 3.5–5.1)
Sodium: 140 mmol/L (ref 135–145)
TCO2: 19 mmol/L — ABNORMAL LOW (ref 22–32)

## 2020-07-09 LAB — FIBRINOGEN
Fibrinogen: 518 mg/dL — ABNORMAL HIGH (ref 210–475)
Fibrinogen: 460 mg/dL (ref 210–475)

## 2020-07-09 LAB — HEPARIN LEVEL (UNFRACTIONATED)
Heparin Unfractionated: 0.57 IU/mL (ref 0.30–0.70)
Heparin Unfractionated: <0.1 IU/mL — ABNORMAL LOW (ref 0.30–0.70)

## 2020-07-09 LAB — POCT ACTIVATED CLOTTING TIME: Activated Clotting Time: 230 seconds

## 2020-07-09 LAB — GLUCOSE, CAPILLARY
Glucose-Capillary: 174 mg/dL — ABNORMAL HIGH (ref 70–99)
Glucose-Capillary: 197 mg/dL — ABNORMAL HIGH (ref 70–99)

## 2020-07-09 LAB — MRSA PCR SCREENING: MRSA by PCR: NEGATIVE

## 2020-07-09 LAB — SARS CORONAVIRUS 2 (TAT 6-24 HRS): SARS Coronavirus 2: NEGATIVE

## 2020-07-09 SURGERY — ABDOMINAL AORTOGRAM W/LOWER EXTREMITY
Anesthesia: LOCAL

## 2020-07-09 MED ORDER — MIDAZOLAM HCL 2 MG/2ML IJ SOLN
INTRAMUSCULAR | Status: DC | PRN
  Administered 2020-07-09: 1 mg via INTRAVENOUS

## 2020-07-09 MED ORDER — SODIUM CHLORIDE 0.9 % IV SOLN
2.0000 mg/h | INTRAVENOUS | Status: DC
  Administered 2020-07-09: 1 mg/h
  Filled 2020-07-09 (×6): qty 10

## 2020-07-09 MED ORDER — ONDANSETRON HCL 4 MG/2ML IJ SOLN: 4.0000 mg | Freq: Four times a day (QID) | INTRAMUSCULAR | Status: DC | PRN

## 2020-07-09 MED ORDER — IODIXANOL 320 MG/ML IV SOLN
INTRAVENOUS | Status: DC | PRN
  Administered 2020-07-09: 95 mL

## 2020-07-09 MED ORDER — FENTANYL CITRATE (PF) 100 MCG/2ML IJ SOLN
INTRAMUSCULAR | Status: DC | PRN
Start: 2020-07-09 — End: 2020-07-09
  Administered 2020-07-09: 25 ug via INTRAVENOUS

## 2020-07-09 MED ORDER — HEPARIN (PORCINE) 25000 UT/250ML-% IV SOLN
800.0000 [IU]/h | INTRAVENOUS | Status: DC
  Administered 2020-07-09: 800 [IU]/h via INTRAVENOUS
  Filled 2020-07-09: qty 250

## 2020-07-09 MED ORDER — INSULIN ASPART 100 UNIT/ML ~~LOC~~ SOLN
0.0000 [IU] | Freq: Three times a day (TID) | SUBCUTANEOUS | Status: DC
  Administered 2020-07-09 – 2020-07-12 (×3): 3 [IU] via SUBCUTANEOUS
  Administered 2020-07-12: 2 [IU] via SUBCUTANEOUS
  Administered 2020-07-13: 3 [IU] via SUBCUTANEOUS
  Administered 2020-07-13 – 2020-07-14 (×2): 2 [IU] via SUBCUTANEOUS

## 2020-07-09 MED ORDER — LIDOCAINE HCL (PF) 1 % IJ SOLN
INTRAMUSCULAR | Status: DC | PRN
  Administered 2020-07-09: 15 mL

## 2020-07-09 MED ORDER — HEPARIN (PORCINE) 25000 UT/250ML-% IV SOLN: 1400.0000 [IU]/h | INTRAVENOUS | Status: DC

## 2020-07-09 MED ORDER — HEPARIN (PORCINE) IN NACL 1000-0.9 UT/500ML-% IV SOLN
INTRAVENOUS | Status: DC | PRN
  Administered 2020-07-09: 500 mL

## 2020-07-09 MED ORDER — FENTANYL CITRATE (PF) 100 MCG/2ML IJ SOLN
INTRAMUSCULAR | Status: AC
  Filled 2020-07-09: qty 2

## 2020-07-09 MED ORDER — MIDAZOLAM HCL 2 MG/2ML IJ SOLN: 1.0000 mg | INTRAMUSCULAR | Status: DC | PRN

## 2020-07-09 MED ORDER — SODIUM CHLORIDE 0.9 % IV SOLN: INTRAVENOUS | Status: DC

## 2020-07-09 MED ORDER — SODIUM CHLORIDE 0.9% FLUSH: 3.0000 mL | INTRAVENOUS | Status: DC | PRN

## 2020-07-09 MED ORDER — CLOPIDOGREL BISULFATE 75 MG PO TABS
75.0000 mg | ORAL_TABLET | Freq: Every day | ORAL | Status: DC
  Administered 2020-07-11 – 2020-07-14 (×4): 75 mg via ORAL
  Filled 2020-07-09 (×4): qty 1

## 2020-07-09 MED ORDER — HEPARIN SODIUM (PORCINE) 1000 UNIT/ML IJ SOLN
INTRAMUSCULAR | Status: DC | PRN
  Administered 2020-07-09: 10000 [IU] via INTRAVENOUS

## 2020-07-09 MED ORDER — HEPARIN SODIUM (PORCINE) 1000 UNIT/ML IJ SOLN
INTRAMUSCULAR | Status: AC
  Filled 2020-07-09: qty 1

## 2020-07-09 MED ORDER — SODIUM CHLORIDE 0.9% FLUSH
3.0000 mL | Freq: Two times a day (BID) | INTRAVENOUS | Status: DC
  Administered 2020-07-09 – 2020-07-10 (×3): 3 mL via INTRAVENOUS

## 2020-07-09 MED ORDER — CHLORHEXIDINE GLUCONATE CLOTH 2 % EX PADS
6.0000 | MEDICATED_PAD | Freq: Every day | CUTANEOUS | Status: DC
  Administered 2020-07-09 – 2020-07-13 (×4): 6 via TOPICAL

## 2020-07-09 MED ORDER — CARVEDILOL 6.25 MG PO TABS
6.2500 mg | ORAL_TABLET | Freq: Two times a day (BID) | ORAL | Status: DC
  Administered 2020-07-09 – 2020-07-14 (×9): 6.25 mg via ORAL
  Filled 2020-07-09 (×9): qty 1

## 2020-07-09 MED ORDER — SODIUM CHLORIDE 0.9 % IV SOLN
1.0000 mg/h | INTRAVENOUS | Status: DC
  Administered 2020-07-09: 1 mg/h
  Filled 2020-07-09 (×7): qty 10

## 2020-07-09 MED ORDER — GLIPIZIDE 10 MG PO TABS
10.0000 mg | ORAL_TABLET | Freq: Two times a day (BID) | ORAL | Status: DC
  Administered 2020-07-09 – 2020-07-14 (×8): 10 mg via ORAL
  Filled 2020-07-09 (×10): qty 1

## 2020-07-09 MED ORDER — ATORVASTATIN CALCIUM 40 MG PO TABS
40.0000 mg | ORAL_TABLET | Freq: Every day | ORAL | Status: DC
  Administered 2020-07-09 – 2020-07-13 (×5): 40 mg via ORAL
  Filled 2020-07-09 (×5): qty 1

## 2020-07-09 MED ORDER — MIDAZOLAM HCL 2 MG/2ML IJ SOLN
INTRAMUSCULAR | Status: AC
  Filled 2020-07-09: qty 2

## 2020-07-09 MED ORDER — MORPHINE SULFATE (PF) 4 MG/ML IV SOLN
5.0000 mg | INTRAVENOUS | Status: DC | PRN
  Administered 2020-07-09: 5 mg via INTRAVENOUS
  Filled 2020-07-09: qty 2

## 2020-07-09 MED ORDER — SODIUM CHLORIDE 0.9 % IV SOLN
250.0000 mL | INTRAVENOUS | Status: DC | PRN
  Administered 2020-07-09: 1000 mL via INTRAVENOUS

## 2020-07-09 MED ORDER — OXYCODONE-ACETAMINOPHEN 5-325 MG PO TABS
1.0000 | ORAL_TABLET | Freq: Four times a day (QID) | ORAL | Status: DC | PRN
  Administered 2020-07-09: 2 via ORAL
  Administered 2020-07-10: 1 via ORAL
  Administered 2020-07-10 – 2020-07-13 (×6): 2 via ORAL
  Administered 2020-07-13: 1 via ORAL
  Filled 2020-07-09 (×5): qty 2
  Filled 2020-07-09: qty 1
  Filled 2020-07-09: qty 2
  Filled 2020-07-09: qty 1
  Filled 2020-07-09 (×2): qty 2

## 2020-07-09 MED ORDER — LIDOCAINE HCL (PF) 1 % IJ SOLN
INTRAMUSCULAR | Status: AC
  Filled 2020-07-09: qty 30

## 2020-07-09 MED ORDER — HEPARIN (PORCINE) IN NACL 1000-0.9 UT/500ML-% IV SOLN
INTRAVENOUS | Status: AC
  Filled 2020-07-09: qty 1000

## 2020-07-09 SURGICAL SUPPLY — 19 items
CATH BEACON 5 .035 65 KMP TIP (CATHETERS) ×1 IMPLANT
CATH INFUS 135CMX50CM (CATHETERS) ×1 IMPLANT
CATH OMNI FLUSH 5F 65CM (CATHETERS) ×1 IMPLANT
CATH QUICKCROSS .035X135CM (MICROCATHETER) ×1 IMPLANT
CATH QUICKCROSS ANG SELECT (CATHETERS) IMPLANT
DEVICE TORQUE .025-.038 (MISCELLANEOUS) ×1 IMPLANT
GLIDEWIRE ADV .035X260CM (WIRE) ×1 IMPLANT
KIT MICROPUNCTURE NIT STIFF (SHEATH) ×1 IMPLANT
KIT PV (KITS) ×2 IMPLANT
SHEATH FLEX ANSEL ANG 6F 45CM (SHEATH) ×1 IMPLANT
SHEATH FLEXOR ANSEL 1 7F 45CM (SHEATH) ×1 IMPLANT
SHEATH PINNACLE 5F 10CM (SHEATH) ×1 IMPLANT
SHEATH PROBE COVER 6X72 (BAG) ×1 IMPLANT
STOPCOCK MORSE 400PSI 3WAY (MISCELLANEOUS) ×1 IMPLANT
SYR MEDRAD MARK V 150ML (SYRINGE) ×1 IMPLANT
TRANSDUCER W/STOPCOCK (MISCELLANEOUS) ×2 IMPLANT
TRAY PV CATH (CUSTOM PROCEDURE TRAY) ×2 IMPLANT
WIRE AMPLATZ SSTIFF .035X260CM (WIRE) ×1 IMPLANT
WIRE BENTSON .035X145CM (WIRE) ×1 IMPLANT

## 2020-07-09 NOTE — Op Note (Signed)
Patient name: Robert Sanchez MRN: [id number] DOB: 1956/03/26 Sex: male  07/09/2020 Pre-operative Diagnosis: Critical limb ischemia of the left lower extremity with tissue loss and occluded left common femoral to posterior tibial artery vein bypass Post-operative diagnosis:  Same Surgeon:  Cephus Shelling, MD Procedure Performed: 1.  Ultrasound-guided access of right common femoral artery 2.  Aortogram 3.  Left lower extremity arteriogram with selection of third order branches 4.  Placement of thrombolytic's catheter in the left lower extremity bypass with initiation of thrombolysis with TPA 5.  65 minutes of monitored moderate conscious sedation time  Indications: 64 year old male that previously underwent a left common femoral to posterior tibial bypass for critical limb ischemia of the left lower extremity.  He presented to the clinic yesterday for surveillance and had a silent occlusion.  Previously underwent thrombolysis earlier this year in March 2021 with successful thrombolysis and percutaneous mechanical thrombectomy.  He presents today after risks and benefits discussed.    Findings:   Aortogram showed no flow-limiting stenosis in the aortoiliac segment with a patent left iliac stent.  There was flow down to the left common femoral with high-grade stenosis in the proximal profunda and short stump of SFA.  We could see a very short stump of the proximal bypass and the remainder of the bypass was occluded throughout its segment.  He did reconstituted distal SFA with patent above knee popliteal artery and a very diseased below-knee popliteal artery and had at least two-vessel runoff via posterior tibial and peroneal artery.  Ultimately I was able to successfully cannulate the bypass with a KMP catheter and a Glidewire advantage and was able to cross the distal anastomosis into the native posterior tibial.  I placed a long UniFuse catheter down the left leg bypass and will initiate  thrombolysis tonight.   Procedure:  The patient was identified in the holding area and taken to room 8.  The patient was then placed supine on the table and prepped and draped in the usual sterile fashion.  A time out was called.  Ultrasound was used to evaluate the right common femoral artery.  It was patent .  A digital ultrasound image was acquired.  A micropuncture needle was used to access the right common femoral artery under ultrasound guidance.  An 018 wire was advanced without resistance and a micropuncture sheath was placed.  The 018 wire was removed and a benson wire was placed.  The micropuncture sheath was exchanged for a 5 french sheath.  An omniflush catheter was advanced over the wire to the level of L-1.  An abdominal angiogram was obtained.  Next the Omni Flush catheter was used to select the left iliac and ultimately advanced a Bentson wire into the left common femoral and got our catheter to track.  Left lower extremity arteriogram was then obtained with pertinent findings noted above most importantly had an occluded left common femoral to PT bypass.  I then used an angled KMP catheter with a Glidewire advantage and was able to successfully cannulate the bypass and got my wire down to the distal end of the bypass and it went easily.  Patient was given 100 units per kilogram heparin.  I then exchanged for a 6 French Ansell sheath in the right groin over the aortic bifurcation.  Ultimately I tried to track a long UniFuse catheter over a Glidewire advantage but it would not track and we kept losing support of the sheath at the aortic bifurcation and  it was kinking up into the aorta.  Ultimately we upsized to a 7 [place] sheath.  We also exchanged for an Amplatz wire down the bypass into the PT for more support.  I was able to get the UniFuse catheter to track further down the bypass.  Inner cannula was placed in the Unifuse catheter and it was secured.  Will initiate thrombolysis at [facility] with 1 mg  an hour and heparin 800 units an hour through the sheath.  Plan: Return to cath lab Thursday for thrombolysis check of left lower extremity.  Cephus Shelling, MD [facility] [place] Office: [phone]

## 2020-07-09 NOTE — Progress Notes (Signed)
ANTICOAGULATION CONSULT NOTE  Pharmacy Consult for heparin Indication: VTE treatment   Allergies  Allergen Reactions  . Lisinopril Other (See Comments)    Syncope  . Codeine Rash    Patient Measurements: Height: 5\' 9"  (175.3 cm) Weight: 102.1 kg (225 lb) IBW/kg (Calculated) : 70.7 Heparin Dosing Weight: 92.5 kg   Vital Signs: Temp: 97.7 F (36.5 C) (09/08 1536) Temp Source: Oral (09/08 1536) BP: 114/61 (09/08 1800) Pulse Rate: 69 (09/08 1800)  Labs: Recent Labs    07/09/20 0833 07/09/20 0833 07/09/20 1332 07/09/20 1748  HGB 13.3   < > 13.1 12.7*  HCT 39.0  --  42.1 39.9  PLT  --   --  288 272  HEPARINUNFRC  --   --  0.57 <0.10*  CREATININE 1.10  --   --   --    < > = values in this interval not displayed.    Estimated Creatinine Clearance: 82 mL/min (by C-G formula based on SCr of 1.1 mg/dL).   Medical History: Past Medical History:  Diagnosis Date  . Acute pulmonary edema (HCC) 06/05/2018  . Arthritis   . COPD (chronic obstructive pulmonary disease) (HCC)    " beginning stages " per pt  . Coronary artery disease   . Diabetes mellitus without complication (HCC)   . Diabetic neuropathy (HCC)   . Diabetic neuropathy (HCC)   . Emphysema/COPD (HCC)    " beginning stages"  . GERD (gastroesophageal reflux disease)   . HOH (hard of hearing)   . Myocardial infarction (HCC)   . Peripheral vascular disease (HCC)     Medications:  Scheduled:  . atorvastatin  40 mg Oral QHS  . carvedilol  6.25 mg Oral BID WC  . Chlorhexidine Gluconate Cloth  6 each Topical Daily  . [START ON 07/10/2020] clopidogrel  75 mg Oral Daily  . glipiZIDE  10 mg Oral BID AC  . insulin aspart  0-15 Units Subcutaneous TID WC  . sodium chloride flush  3 mL Intravenous Q12H    Assessment: 64 yom with known hx of L common femoral to PT bypass in 2019 requiring thrombolysis and mechanical thrombectomy in March 2021. Was discharged on apixaban but hasn't been able to afford so stopped  taking.   Now s/p aortogram 9/8 with initiation of thrombolysis with tPA. Hgb 13.3, plt 230. No s/sx of bleeding. On heparin infusion at 800 units/hr from procedure.  Heparin level came back at 0.57 ~2 hours after start - did receive 10,000 units in procedure today.   Heparin level this evening is undetectable.  No overt bleeding or complications noted.   Goal of Therapy:  Heparin level 0.2-0.5 units/ml Monitor platelets by anticoagulation protocol: Yes   Plan:  Increase IV heparin to 1000 units/hr. Order heparin level in 6 hours  Monitor daily HL, CBC, and for s/sx of bleeding   11/8, Reece Leader, [facility] Clinical Pharmacist  07/09/2020 7:11 PM

## 2020-07-09 NOTE — Progress Notes (Addendum)
ANTICOAGULATION CONSULT NOTE - Initial Consult  Pharmacy Consult for heparin Indication: VTE treatment   Allergies  Allergen Reactions  . Lisinopril Other (See Comments)    Syncope  . Codeine Rash    Patient Measurements: Height: 5\' 9"  (175.3 cm) Weight: 102.1 kg (225 lb) IBW/kg (Calculated) : 70.7 Heparin Dosing Weight: 92.5 kg   Vital Signs: Temp: 98.2 F (36.8 C) (09/08 0751) Temp Source: Oral (09/08 0751) BP: 137/75 (09/08 1147) Pulse Rate: 65 (09/08 1147)  Labs: Recent Labs    07/09/20 0833  HGB 13.3  HCT 39.0  CREATININE 1.10    Estimated Creatinine Clearance: 82 mL/min (by C-G formula based on SCr of 1.1 mg/dL).   Medical History: Past Medical History:  Diagnosis Date  . Acute pulmonary edema (HCC) 06/05/2018  . Arthritis   . COPD (chronic obstructive pulmonary disease) (HCC)    " beginning stages " per pt  . Coronary artery disease   . Diabetes mellitus without complication (HCC)   . Diabetic neuropathy (HCC)   . Diabetic neuropathy (HCC)   . Emphysema/COPD (HCC)    " beginning stages"  . GERD (gastroesophageal reflux disease)   . HOH (hard of hearing)   . Myocardial infarction (HCC)   . Peripheral vascular disease (HCC)     Medications:  Scheduled:  . atorvastatin  40 mg Oral QHS  . carvedilol  6.25 mg Oral BID WC  . [START ON 07/10/2020] clopidogrel  75 mg Oral Daily  . glipiZIDE  10 mg Oral BID AC  . sodium chloride flush  3 mL Intravenous Q12H    Assessment: 64 yom with known hx of L common femoral to PT bypass in 2019 requiring thrombolysis and mechanical thrombectomy in March 2021. Was discharged on apixaban but hasn't been able to afford so stopped taking.   Now s/p aortogram 9/8 with initiation of thrombolysis with tPA. Hgb 13.3, plt 230. No s/sx of bleeding. On heparin infusion at 800 units/hr from procedure.  Heparin level came back at 0.57 ~2 hours after start - did receive 10,000 units in procedure today. Will keep at same rate  then adjust pending next level.    Goal of Therapy:  Heparin level 0.2-0.5 units/ml Monitor platelets by anticoagulation protocol: Yes   Plan:  Continue heparin infusion at 800 units/hr Order heparin level in 6 hours  Monitor daily HL, CBC, and for s/sx of bleeding   11/8, PharmD, BCCCP Clinical Pharmacist  Phone: [phone] 07/09/2020 1:22 PM  Please check AMION for all [facility] phone numbers After 10:00 PM, call Main Pharmacy [phone]

## 2020-07-09 NOTE — H&P (Signed)
History and Physical Interval Note:  07/09/2020 10:21 AM  Robert Sanchez  has presented today for surgery, with the diagnosis of lower limb ischemia.  The various methods of treatment have been discussed with the patient and family. After consideration of risks, benefits and other options for treatment, the patient has consented to  Procedure(s): ABDOMINAL AORTOGRAM W/LOWER EXTREMITY (N/A) as a surgical intervention.  The patient's history has been reviewed, patient examined, no change in status, stable for surgery.  I have reviewed the patient's chart and labs.  Questions were answered to the patient's satisfaction.    Aortogram, left leg arteriogram, concern for left leg bypass occlusion of unclear duration.  Cephus Shellinghristopher J Garreth Burnsworth  Patient name: Robert Sanchez     MRN: [id number]        DOB: 1956-07-05          Sex: male  REASON FOR VISIT: 2 month follow-up wound check left great toe  HPI: Robert Sanchez is a 64 y.o. male with history of left common femoral to PT bypass with GSV on 08/21/2018 for CLI with rest pain.  Ultimately he presented to the hospital earlier this year with an occluded bypass and underwent thrombolysis and subsequent percutaneous mechanical thrombectomy with angioplasty of the bypass and PT on 01/16/20- 01/17/2020.  Ultimately his bypass was salvaged and he was discharged on Eliquis.  During this event he did develop necrotic left great toe that we have been watching.  He has wanted to avoid amputation and we have been closely following this wound.  On follow-up today he has not been taking his Eliquis as he states this is too expensive.  States he is still taking the Plavix.  States he is not having any trouble with his foot including no rest pain.  Feels the toe ulcer on the left great toe is stable.  He is back at work.      Past Medical History:  Diagnosis Date  . Acute pulmonary edema (HCC) 06/05/2018  . Arthritis   . COPD (chronic obstructive pulmonary  disease) (HCC)    " beginning stages " per pt  . Coronary artery disease   . Diabetes mellitus without complication (HCC)   . Diabetic neuropathy (HCC)   . Diabetic neuropathy (HCC)   . Emphysema/COPD (HCC)    " beginning stages"  . GERD (gastroesophageal reflux disease)   . HOH (hard of hearing)   . Myocardial infarction (HCC)   . Peripheral vascular disease [facility](HCC)          Past Surgical History:  Procedure Laterality Date  . ABDOMINAL AORTOGRAM W/LOWER EXTREMITY N/A 06/21/2018   Procedure: ABDOMINAL AORTOGRAM W/LOWER EXTREMITY;  Surgeon: Cephus Shellinglark, Casilda Pickerill J, MD;  Location: [facility] INVASIVE CV LAB;  Service: Cardiovascular;  Laterality: N/A;  . ABDOMINAL AORTOGRAM W/LOWER EXTREMITY Left 01/16/2020   Procedure: ABDOMINAL AORTOGRAM W/LOWER EXTREMITY;  Surgeon: Cephus Shellinglark, Shalice Woodring J, MD;  Location: [facility] INVASIVE CV LAB;  Service: Cardiovascular;  Laterality: Left;  . CORONARY ARTERY BYPASS GRAFT N/A 06/12/2018   Procedure: CORONARY ARTERY BYPASS GRAFTING (CABG) x 3 WITH ENDOSCOPIC HARVESTING OF RIGHT GREATER SAPHENOUS VEIN. LIMA TO LAD. SVG TO PD. SVG TO DIAGONAL.;  Surgeon: Kerin PernaVan Trigt, Peter, MD;  [facility];  Service: Open Heart Surgery;  Laterality: N/A;  . FEMORAL-TIBIAL BYPASS GRAFT Left 08/21/2018   Procedure: BYPASS GRAFT LEFT FEMORAL TO POSTERIOR TIBIAL ARTERY USING LEFT REVERSED GREAT SAPHENOUS VEIN;  Surgeon: Cephus Shellinglark, Jaquilla Woodroof J, MD;  Location: [facility] OR;  Service: Vascular;  Laterality: Left;  . KNEE ARTHROSCOPY    . LOWER EXTREMITY ANGIOGRAPHY Left 01/17/2020   Procedure: Lower Extremity Angiography;  Surgeon: Cephus Shelling, MD;  Location: [facility] INVASIVE CV LAB;  Service: Cardiovascular;  Laterality: Left;  . PERIPHERAL VASCULAR BALLOON ANGIOPLASTY Left 01/17/2020   Procedure: PERIPHERAL VASCULAR BALLOON ANGIOPLASTY;  Surgeon: Cephus Shelling, MD;  Location: [facility] INVASIVE CV LAB;  Service: Cardiovascular;  Laterality: Left;  pt  . PERIPHERAL VASCULAR  INTERVENTION Left 06/21/2018   Procedure: PERIPHERAL VASCULAR INTERVENTION;  Surgeon: Cephus Shelling, MD;  Location: [facility] INVASIVE CV LAB;  Service: Cardiovascular;  Laterality: Left;  . PERIPHERAL VASCULAR THROMBECTOMY Left 01/16/2020   Procedure: PERIPHERAL VASCULAR THROMBECTOMY;  Surgeon: Cephus Shelling, MD;  Location: [facility] INVASIVE CV LAB;  Service: Cardiovascular;  Laterality: Left;  Lytic Catheter Placement left fem-pop bypass  . PERIPHERAL VASCULAR THROMBECTOMY Left 01/17/2020   Procedure: PERIPHERAL VASCULAR THROMBECTOMY;  Surgeon: Cephus Shelling, MD;  Location: [facility] INVASIVE CV LAB;  Service: Cardiovascular;  Laterality: Left;  fem-pt bypass  . RIGHT/LEFT HEART CATH AND CORONARY ANGIOGRAPHY N/A 06/05/2018   Procedure: RIGHT/LEFT HEART CATH AND CORONARY ANGIOGRAPHY;  Surgeon: Orpah Cobb, MD;  Location: [facility] INVASIVE CV LAB;  Service: Cardiovascular;  Laterality: N/A;  . SPINE SURGERY    . TEE WITHOUT CARDIOVERSION N/A 06/12/2018   Procedure: TRANSESOPHAGEAL ECHOCARDIOGRAM (TEE);  Surgeon: Donata Clay, Theron Arista, MD;  Location: [facility] OR;  Service: Open Heart Surgery;  Laterality: N/A;  . teeth extractions    . VEIN HARVEST Left 08/21/2018   Procedure: VEIN HARVEST LEFT GREAT SAPHENOUS VEIN;  Surgeon: Cephus Shelling, MD;  [facility];  Service: Vascular;  Laterality: Left;         Family History  Problem Relation Age of Onset  . Diabetes Mother   . Lung disease Mother   . Cancer Father   . Heart disease Father     SOCIAL HISTORY: Social History        Tobacco Use  . Smoking status: Former Smoker    Packs/day: 1.00    Years: 40.00    Pack years: 40.00    Quit date: 06/02/2018    Years since quitting: 2.1  . Smokeless tobacco: Never Used  Substance Use Topics  . Alcohol use: Not Currently    Comment: quit         Allergies  Allergen Reactions  . Lisinopril Other (See Comments)    Syncope  . Codeine Rash          Current  Outpatient Medications  Medication Sig Dispense Refill  . apixaban (ELIQUIS) 5 MG TABS tablet Take 1 tablet (5 mg total) by mouth 2 (two) times daily. 60 tablet 2  . atorvastatin (LIPITOR) 40 MG tablet Take 1 tablet (40 mg total) by mouth at bedtime. 90 tablet 3  . carvedilol (COREG) 6.25 MG tablet Take 1 tablet (6.25 mg total) by mouth 2 (two) times daily with a meal. 60 tablet 5  . clopidogrel (PLAVIX) 75 MG tablet Take 1 tablet (75 mg total) by mouth daily. 90 tablet 3  . furosemide (LASIX) 40 MG tablet Take 1 tablet (40 mg total) by mouth daily. 30 tablet 5  . glipiZIDE (GLUCOTROL) 10 MG tablet Take 1 tablet (10 mg total) by mouth 2 (two) times daily before a meal. 180 tablet 3  . metFORMIN (GLUCOPHAGE) 1000 MG tablet Take 1 tablet (1,000 mg total) by mouth 2 (two) times daily with a meal. 180 tablet 3  .  potassium chloride SA (KLOR-CON) 20 MEQ tablet Take 1 tablet (20 mEq total) by mouth daily. 30 tablet 5   No current facility-administered medications for this visit.    REVIEW OF SYSTEMS:  [X]  denotes positive finding, [ ]  denotes negative finding Cardiac  Comments:  Chest pain or chest pressure:    Shortness of breath upon exertion:    Short of breath when lying flat:    Irregular heart rhythm:        Vascular    Pain in calf, thigh, or hip brought on by ambulation:    Pain in feet at night that wakes you up from your sleep:     Blood clot in your veins:    Leg swelling:         Pulmonary    Oxygen at home:    Productive cough:     Wheezing:         Neurologic    Sudden weakness in arms or legs:     Sudden numbness in arms or legs:     Sudden onset of difficulty speaking or slurred speech:    Temporary loss of vision in one eye:     Problems with dizziness:         Gastrointestinal    Blood in stool:     Vomited blood:         Genitourinary    Burning when urinating:     Blood in urine:         Psychiatric    Major depression:         Hematologic    Bleeding problems:    Problems with blood clotting too easily:        Skin    Rashes or ulcers:        Constitutional    Fever or chills:      PHYSICAL EXAM:    Vitals:   07/08/20 0846  BP: 134/70  Pulse: 76  Resp: 18  Temp: (!) 96.3 F (35.7 C)  TempSrc: Temporal  SpO2: 98%  Weight: 221 lb (100.2 kg)  Height: 5\' 9"  (1.753 m)    GENERAL: The patient is a well-nourished male, in no acute distress. The vital signs are documented above. CARDIAC: There is a regular rate and rhythm.  VASCULAR:  Left PT monophasic signal.  I think I can get a signal in the bypass but difficult to visualize on duplex today. Left foot warm Left great toe tip pictured below after debridement in clinic.         DATA:   ABIs on the left are 0.48 monophasic and on left leg arterial duplex difficult to visualize bypass and suspect occluded.  Assessment/Plan:  64 year old male status post thrombolysis and subsequent percutaneous mechanical thrombectomy of left common femoral to PT bypass on 01/16/20-01/17/2020 after his bypass occluded.  He presents for continued follow-up of his left great toe wound that he developed after his bypass occluded back in March.  Difficult to visualize flow in the bypass today and suspect this may be occluded.  He has been medically noncompliant and has not been taking his Eliquis as we have instructed for high risk bypass failure.  He is not having any severe rest pain or progression of his wound like he had in the past when his bypass previously occluded.  I recommended a left leg arteriogram possible intervention and possible thrombolysis.  I will try and do this tomorrow given unclear to me the timeline of his  bypasses occlusion and this may not be salvageable as I discussed with him today.  He is at high risk for limb loss.  Risks and benefits discussed in  detail.  Cephus Shelling, MD [facility] [place] Office: [phone]

## 2020-07-10 ENCOUNTER — Inpatient Hospital Stay (HOSPITAL_COMMUNITY): Payer: Self-pay | Admitting: Anesthesiology

## 2020-07-10 ENCOUNTER — Inpatient Hospital Stay (HOSPITAL_COMMUNITY)
Admission: RE | Disposition: A | Discharge status: Home / Self Care | Payer: Self-pay | Source: Home / Self Care | Attending: Vascular Surgery

## 2020-07-10 ENCOUNTER — Other Ambulatory Visit: Payer: Self-pay

## 2020-07-10 ENCOUNTER — Ambulatory Visit (HOSPITAL_COMMUNITY): Admission: RE | Admit: 2020-07-10 | Payer: Self-pay | Source: Home / Self Care | Admitting: Vascular Surgery

## 2020-07-10 ENCOUNTER — Encounter (HOSPITAL_COMMUNITY)
Admission: RE | Disposition: A | Discharge status: Home / Self Care | Payer: Self-pay | Source: Home / Self Care | Attending: Vascular Surgery

## 2020-07-10 ENCOUNTER — Encounter (HOSPITAL_COMMUNITY): Payer: Self-pay | Admitting: Vascular Surgery

## 2020-07-10 HISTORY — PX: PERIPHERAL VASCULAR THROMBECTOMY: CATH118306

## 2020-07-10 HISTORY — PX: THROMBECTOMY ILIAC ARTERY: SHX6405

## 2020-07-10 HISTORY — PX: FEMORAL-POPLITEAL BYPASS GRAFT: SHX937

## 2020-07-10 LAB — LIPID PANEL
Cholesterol: 92 mg/dL (ref 0–200)
HDL: 17 mg/dL — ABNORMAL LOW (ref >40)
LDL Cholesterol: 52 mg/dL (ref 0–99)
Total CHOL/HDL Ratio: 5.4 RATIO
Triglycerides: 114 mg/dL (ref <150)
VLDL: 23 mg/dL (ref 0–40)

## 2020-07-10 LAB — BASIC METABOLIC PANEL
Anion gap: 8 (ref 5–15)
BUN: 19 mg/dL (ref 8–23)
CO2: 23 mmol/L (ref 22–32)
Calcium: 8.6 mg/dL — ABNORMAL LOW (ref 8.9–10.3)
Chloride: 105 mmol/L (ref 98–111)
Creatinine, Ser: 1.34 mg/dL — ABNORMAL HIGH (ref 0.61–1.24)
GFR calc Af Amer: >60 mL/min (ref >60)
GFR calc non Af Amer: 56 mL/min — ABNORMAL LOW (ref >60)
Glucose, Bld: 114 mg/dL — ABNORMAL HIGH (ref 70–99)
Potassium: 4.7 mmol/L (ref 3.5–5.1)
Sodium: 136 mmol/L (ref 135–145)

## 2020-07-10 LAB — CBC
HCT: 38.4 % — ABNORMAL LOW (ref 39.0–52.0)
HCT: 40.3 % (ref 39.0–52.0)
Hemoglobin: 12.1 g/dL — ABNORMAL LOW (ref 13.0–17.0)
Hemoglobin: 12.9 g/dL — ABNORMAL LOW (ref 13.0–17.0)
MCH: 28.8 pg (ref 26.0–34.0)
MCH: 29.4 pg (ref 26.0–34.0)
MCHC: 31.5 g/dL (ref 30.0–36.0)
MCHC: 32 g/dL (ref 30.0–36.0)
MCV: 91.4 fL (ref 80.0–100.0)
MCV: 91.8 fL (ref 80.0–100.0)
Platelets: 251 10*3/uL (ref 150–400)
Platelets: 269 10*3/uL (ref 150–400)
RBC: 4.2 MIL/uL — ABNORMAL LOW (ref 4.22–5.81)
RBC: 4.39 MIL/uL (ref 4.22–5.81)
RDW: 14.2 % (ref 11.5–15.5)
RDW: 14.1 % (ref 11.5–15.5)
WBC: 9.5 10*3/uL (ref 4.0–10.5)
WBC: 9.9 10*3/uL (ref 4.0–10.5)
nRBC: 0 % (ref 0.0–0.2)
nRBC: 0 % (ref 0.0–0.2)

## 2020-07-10 LAB — HEPARIN LEVEL (UNFRACTIONATED)
Heparin Unfractionated: <0.1 IU/mL — ABNORMAL LOW (ref 0.30–0.70)
Heparin Unfractionated: <0.1 IU/mL — ABNORMAL LOW (ref 0.30–0.70)
Heparin Unfractionated: <0.1 IU/mL — ABNORMAL LOW (ref 0.30–0.70)

## 2020-07-10 LAB — GLUCOSE, CAPILLARY
Glucose-Capillary: 114 mg/dL — ABNORMAL HIGH (ref 70–99)
Glucose-Capillary: 103 mg/dL — ABNORMAL HIGH (ref 70–99)
Glucose-Capillary: 121 mg/dL — ABNORMAL HIGH (ref 70–99)
Glucose-Capillary: 160 mg/dL — ABNORMAL HIGH (ref 70–99)

## 2020-07-10 LAB — TYPE AND SCREEN
ABO/RH(D): O NEG
Antibody Screen: NEGATIVE

## 2020-07-10 LAB — POCT ACTIVATED CLOTTING TIME
Activated Clotting Time: 125 seconds
Activated Clotting Time: 246 seconds

## 2020-07-10 LAB — FIBRINOGEN
Fibrinogen: 448 mg/dL (ref 210–475)
Fibrinogen: 452 mg/dL (ref 210–475)

## 2020-07-10 SURGERY — BYPASS GRAFT FEMORAL-POPLITEAL ARTERY
Anesthesia: General | Site: Groin | Laterality: Left

## 2020-07-10 SURGERY — PERIPHERAL VASCULAR THROMBECTOMY
Anesthesia: LOCAL | Laterality: Left

## 2020-07-10 MED ORDER — ACETAMINOPHEN 325 MG PO TABS: 325.0000 mg | ORAL_TABLET | ORAL | Status: DC | PRN

## 2020-07-10 MED ORDER — HEPARIN (PORCINE) IN NACL 1000-0.9 UT/500ML-% IV SOLN
INTRAVENOUS | Status: AC
  Filled 2020-07-10: qty 1000

## 2020-07-10 MED ORDER — CEFAZOLIN SODIUM-DEXTROSE 2-4 GM/100ML-% IV SOLN
2.0000 g | Freq: Three times a day (TID) | INTRAVENOUS | Status: AC
  Administered 2020-07-10 – 2020-07-11 (×2): 2 g via INTRAVENOUS
  Filled 2020-07-10 (×2): qty 100

## 2020-07-10 MED ORDER — POTASSIUM CHLORIDE CRYS ER 20 MEQ PO TBCR: 20.0000 meq | EXTENDED_RELEASE_TABLET | Freq: Every day | ORAL | Status: DC | PRN

## 2020-07-10 MED ORDER — PROTAMINE SULFATE 10 MG/ML IV SOLN
INTRAVENOUS | Status: DC | PRN
  Administered 2020-07-10: 50 mg via INTRAVENOUS

## 2020-07-10 MED ORDER — DOCUSATE SODIUM 100 MG PO CAPS
100.0000 mg | ORAL_CAPSULE | Freq: Every day | ORAL | Status: DC
  Administered 2020-07-11 – 2020-07-14 (×4): 100 mg via ORAL
  Filled 2020-07-10 (×4): qty 1

## 2020-07-10 MED ORDER — CHLORHEXIDINE GLUCONATE 0.12 % MT SOLN: 15.0000 mL | Freq: Once | OROMUCOSAL | Status: AC

## 2020-07-10 MED ORDER — ONDANSETRON HCL 4 MG/2ML IJ SOLN
INTRAMUSCULAR | Status: AC
  Filled 2020-07-10: qty 2

## 2020-07-10 MED ORDER — HEPARIN (PORCINE) IN NACL 1000-0.9 UT/500ML-% IV SOLN
INTRAVENOUS | Status: DC | PRN
  Administered 2020-07-10: 500 mL

## 2020-07-10 MED ORDER — ACETAMINOPHEN 10 MG/ML IV SOLN: 1000.0000 mg | Freq: Once | INTRAVENOUS | Status: DC | PRN

## 2020-07-10 MED ORDER — ALUM & MAG HYDROXIDE-SIMETH 200-200-20 MG/5ML PO SUSP: 15.0000 mL | ORAL | Status: DC | PRN

## 2020-07-10 MED ORDER — POLYETHYLENE GLYCOL 3350 17 G PO PACK: 17.0000 g | PACK | Freq: Every day | ORAL | Status: DC | PRN

## 2020-07-10 MED ORDER — HEMOSTATIC AGENTS (NO CHARGE) OPTIME
TOPICAL | Status: DC | PRN
  Administered 2020-07-10: 2 via TOPICAL

## 2020-07-10 MED ORDER — MAGNESIUM SULFATE 2 GM/50ML IV SOLN: 2.0000 g | Freq: Every day | INTRAVENOUS | Status: DC | PRN

## 2020-07-10 MED ORDER — BISACODYL 10 MG RE SUPP: 10.0000 mg | Freq: Every day | RECTAL | Status: DC | PRN

## 2020-07-10 MED ORDER — ASPIRIN EC 81 MG PO TBEC
81.0000 mg | DELAYED_RELEASE_TABLET | Freq: Every day | ORAL | Status: DC
  Administered 2020-07-11 – 2020-07-14 (×4): 81 mg via ORAL
  Filled 2020-07-10 (×4): qty 1

## 2020-07-10 MED ORDER — PHENYLEPHRINE HCL-NACL 10-0.9 MG/250ML-% IV SOLN
INTRAVENOUS | Status: DC | PRN
  Administered 2020-07-10: 25 ug/min via INTRAVENOUS

## 2020-07-10 MED ORDER — SODIUM CHLORIDE 0.9 % IV SOLN: INTRAVENOUS | Status: DC

## 2020-07-10 MED ORDER — ASPIRIN EC 81 MG PO TBEC: 81.0000 mg | DELAYED_RELEASE_TABLET | Freq: Every day | ORAL | Status: DC

## 2020-07-10 MED ORDER — CHLORHEXIDINE GLUCONATE 0.12 % MT SOLN
OROMUCOSAL | Status: AC
  Administered 2020-07-10: 15 mL via OROMUCOSAL
  Filled 2020-07-10: qty 15

## 2020-07-10 MED ORDER — FENTANYL CITRATE (PF) 250 MCG/5ML IJ SOLN
INTRAMUSCULAR | Status: AC
  Filled 2020-07-10: qty 5

## 2020-07-10 MED ORDER — LIDOCAINE 2% (20 MG/ML) 5 ML SYRINGE
INTRAMUSCULAR | Status: DC | PRN
  Administered 2020-07-10: 60 mg via INTRAVENOUS

## 2020-07-10 MED ORDER — LACTATED RINGERS IV SOLN: INTRAVENOUS | Status: DC

## 2020-07-10 MED ORDER — CEFAZOLIN SODIUM-DEXTROSE 2-3 GM-%(50ML) IV SOLR
INTRAVENOUS | Status: DC | PRN
  Administered 2020-07-10: 2 g via INTRAVENOUS

## 2020-07-10 MED ORDER — 0.9 % SODIUM CHLORIDE (POUR BTL) OPTIME
TOPICAL | Status: DC | PRN
  Administered 2020-07-10: 1000 mL

## 2020-07-10 MED ORDER — ONDANSETRON HCL 4 MG/2ML IJ SOLN
INTRAMUSCULAR | Status: DC | PRN
  Administered 2020-07-10: 4 mg via INTRAVENOUS

## 2020-07-10 MED ORDER — ROCURONIUM BROMIDE 10 MG/ML (PF) SYRINGE
PREFILLED_SYRINGE | INTRAVENOUS | Status: DC | PRN
  Administered 2020-07-10: 60 mg via INTRAVENOUS

## 2020-07-10 MED ORDER — FENTANYL CITRATE (PF) 100 MCG/2ML IJ SOLN: 25.0000 ug | INTRAMUSCULAR | Status: DC | PRN

## 2020-07-10 MED ORDER — IODIXANOL 320 MG/ML IV SOLN
INTRAVENOUS | Status: DC | PRN
  Administered 2020-07-10: 40 mL via INTRA_ARTERIAL

## 2020-07-10 MED ORDER — LIDOCAINE HCL (PF) 1 % IJ SOLN
INTRAMUSCULAR | Status: AC
  Filled 2020-07-10: qty 30

## 2020-07-10 MED ORDER — SUGAMMADEX SODIUM 200 MG/2ML IV SOLN
INTRAVENOUS | Status: DC | PRN
  Administered 2020-07-10: 200 mg via INTRAVENOUS

## 2020-07-10 MED ORDER — FENTANYL CITRATE (PF) 100 MCG/2ML IJ SOLN
INTRAMUSCULAR | Status: AC
  Filled 2020-07-10: qty 2

## 2020-07-10 MED ORDER — PROPOFOL 10 MG/ML IV BOLUS
INTRAVENOUS | Status: AC
  Filled 2020-07-10: qty 20

## 2020-07-10 MED ORDER — MIDAZOLAM HCL 2 MG/2ML IJ SOLN
INTRAMUSCULAR | Status: DC | PRN
  Administered 2020-07-10: 1 mg via INTRAVENOUS

## 2020-07-10 MED ORDER — FENTANYL CITRATE (PF) 100 MCG/2ML IJ SOLN
INTRAMUSCULAR | Status: DC | PRN
Start: 2020-07-10 — End: 2020-07-10
  Administered 2020-07-10: 50 ug via INTRAVENOUS
  Administered 2020-07-10: 100 ug via INTRAVENOUS
  Administered 2020-07-10 (×3): 50 ug via INTRAVENOUS
  Administered 2020-07-10 (×2): 100 ug via INTRAVENOUS

## 2020-07-10 MED ORDER — MIDAZOLAM HCL 2 MG/2ML IJ SOLN
INTRAMUSCULAR | Status: AC
  Filled 2020-07-10: qty 2

## 2020-07-10 MED ORDER — PROPOFOL 10 MG/ML IV BOLUS
INTRAVENOUS | Status: DC | PRN
  Administered 2020-07-10: 150 mg via INTRAVENOUS

## 2020-07-10 MED ORDER — FENTANYL CITRATE (PF) 100 MCG/2ML IJ SOLN
INTRAMUSCULAR | Status: DC | PRN
Start: 2020-07-10 — End: 2020-07-10
  Administered 2020-07-10: 50 ug via INTRAVENOUS

## 2020-07-10 MED ORDER — HEPARIN SODIUM (PORCINE) 1000 UNIT/ML IJ SOLN
INTRAMUSCULAR | Status: DC | PRN
  Administered 2020-07-10: 5000 [IU] via INTRAVENOUS

## 2020-07-10 MED ORDER — HEPARIN SODIUM (PORCINE) 1000 UNIT/ML IJ SOLN
INTRAMUSCULAR | Status: AC
  Filled 2020-07-10: qty 1

## 2020-07-10 MED ORDER — ACETAMINOPHEN 650 MG RE SUPP: 325.0000 mg | RECTAL | Status: DC | PRN

## 2020-07-10 MED ORDER — ORAL CARE MOUTH RINSE: 15.0000 mL | Freq: Once | OROMUCOSAL | Status: AC

## 2020-07-10 MED ORDER — PANTOPRAZOLE SODIUM 40 MG PO TBEC
40.0000 mg | DELAYED_RELEASE_TABLET | Freq: Every day | ORAL | Status: DC
  Administered 2020-07-11 – 2020-07-14 (×4): 40 mg via ORAL
  Filled 2020-07-10 (×4): qty 1

## 2020-07-10 MED ORDER — HEPARIN SODIUM (PORCINE) 1000 UNIT/ML IJ SOLN
INTRAMUSCULAR | Status: DC | PRN
  Administered 2020-07-10: 10000 [IU] via INTRAVENOUS

## 2020-07-10 MED ORDER — PHENOL 1.4 % MT LIQD: 1.0000 | OROMUCOSAL | Status: DC | PRN

## 2020-07-10 MED ORDER — GUAIFENESIN-DM 100-10 MG/5ML PO SYRP: 15.0000 mL | ORAL_SOLUTION | ORAL | Status: DC | PRN

## 2020-07-10 MED ORDER — MIDAZOLAM HCL 5 MG/5ML IJ SOLN
INTRAMUSCULAR | Status: DC | PRN
  Administered 2020-07-10: 2 mg via INTRAVENOUS

## 2020-07-10 MED ORDER — SODIUM CHLORIDE 0.9 % IV SOLN
INTRAVENOUS | Status: DC | PRN
  Administered 2020-07-10: 14:00:00 500 mL

## 2020-07-10 MED ORDER — PROMETHAZINE HCL 25 MG/ML IJ SOLN: 6.2500 mg | INTRAMUSCULAR | Status: DC | PRN

## 2020-07-10 MED ORDER — METOPROLOL TARTRATE 5 MG/5ML IV SOLN: 2.0000 mg | INTRAVENOUS | Status: DC | PRN

## 2020-07-10 MED ORDER — LABETALOL HCL 5 MG/ML IV SOLN
INTRAVENOUS | Status: DC | PRN
  Administered 2020-07-10 (×2): 10 mg via INTRAVENOUS

## 2020-07-10 MED ORDER — HYDRALAZINE HCL 20 MG/ML IJ SOLN: 5.0000 mg | INTRAMUSCULAR | Status: DC | PRN

## 2020-07-10 MED ORDER — LIDOCAINE 2% (20 MG/ML) 5 ML SYRINGE
INTRAMUSCULAR | Status: AC
  Filled 2020-07-10: qty 5

## 2020-07-10 MED ORDER — LABETALOL HCL 5 MG/ML IV SOLN: 10.0000 mg | INTRAVENOUS | Status: DC | PRN

## 2020-07-10 MED ORDER — SODIUM CHLORIDE 0.9 % IV SOLN: 500.0000 mL | Freq: Once | INTRAVENOUS | Status: DC | PRN

## 2020-07-10 MED ORDER — DEXMEDETOMIDINE (PRECEDEX) IN NS 20 MCG/5ML (4 MCG/ML) IV SYRINGE
PREFILLED_SYRINGE | INTRAVENOUS | Status: DC | PRN
  Administered 2020-07-10: 12 ug via INTRAVENOUS
  Administered 2020-07-10: 8 ug via INTRAVENOUS

## 2020-07-10 SURGICAL SUPPLY — 79 items
ADH SKN CLS APL DERMABOND .7 (GAUZE/BANDAGES/DRESSINGS) ×4
AGENT HMST SPONGE THK3/8 (HEMOSTASIS)
BANDAGE ESMARK 6X9 LF (GAUZE/BANDAGES/DRESSINGS) IMPLANT
BLADE CLIPPER SURG (BLADE) ×3 IMPLANT
BNDG CMPR 9X6 STRL LF SNTH (GAUZE/BANDAGES/DRESSINGS)
BNDG ESMARK 6X9 LF (GAUZE/BANDAGES/DRESSINGS)
CANISTER SUCT 3000ML PPV (MISCELLANEOUS) ×3 IMPLANT
CANNULA VESSEL 3MM 2 BLNT TIP (CANNULA) ×2 IMPLANT
CATH EMB 3FR 40CM (CATHETERS) ×2 IMPLANT
CATH EMB 4FR 40CM (CATHETERS) ×2 IMPLANT
CLIP FOGARTY SPRING 6M (CLIP) ×3 IMPLANT
CLIP VESOCCLUDE MED 24/CT (CLIP) ×3 IMPLANT
CLIP VESOCCLUDE SM WIDE 24/CT (CLIP) ×3 IMPLANT
COVER PROBE W GEL 5X96 (DRAPES) ×1 IMPLANT
COVER WAND RF STERILE (DRAPES) ×3 IMPLANT
CUFF TOURN SGL QUICK 24 (TOURNIQUET CUFF)
CUFF TOURN SGL QUICK 34 (TOURNIQUET CUFF)
CUFF TOURN SGL QUICK 42 (TOURNIQUET CUFF) IMPLANT
CUFF TRNQT CYL 24X4X16.5-23 (TOURNIQUET CUFF) IMPLANT
CUFF TRNQT CYL 34X4.125X (TOURNIQUET CUFF) IMPLANT
DERMABOND ADVANCED (GAUZE/BANDAGES/DRESSINGS) ×2
DERMABOND ADVANCED .7 DNX12 (GAUZE/BANDAGES/DRESSINGS) ×3 IMPLANT
DRAIN CHANNEL 15F RND FF W/TCR (WOUND CARE) IMPLANT
DRAPE C-ARM 42X72 X-RAY (DRAPES) ×3 IMPLANT
DRAPE HALF SHEET 40X57 (DRAPES) IMPLANT
DRAPE ORTHO SPLIT 77X108 STRL (DRAPES) ×3
DRAPE SURG ORHT 6 SPLT 77X108 (DRAPES) ×1 IMPLANT
DRSG TEGADERM 2-3/8X2-3/4 SM (GAUZE/BANDAGES/DRESSINGS) ×2 IMPLANT
DRSG TEGADERM 4X4.75 (GAUZE/BANDAGES/DRESSINGS) ×2 IMPLANT
ELECT CAUTERY BLADE 6.4 (BLADE) ×2 IMPLANT
ELECT REM PT RETURN 9FT ADLT (ELECTROSURGICAL) ×3
ELECTRODE REM PT RTRN 9FT ADLT (ELECTROSURGICAL) ×2 IMPLANT
EVACUATOR SILICONE 100CC (DRAIN) IMPLANT
GAUZE SPONGE 2X2 8PLY STRL LF (GAUZE/BANDAGES/DRESSINGS) ×1 IMPLANT
GAUZE SPONGE 4X4 12PLY STRL (GAUZE/BANDAGES/DRESSINGS) ×2 IMPLANT
GLOVE BIO SURGEON STRL SZ7.5 (GLOVE) ×7 IMPLANT
GLOVE BIOGEL PI IND STRL 8 (GLOVE) ×2 IMPLANT
GLOVE BIOGEL PI INDICATOR 8 (GLOVE) ×1
GOWN STRL REUS W/ TWL LRG LVL3 (GOWN DISPOSABLE) ×6 IMPLANT
GOWN STRL REUS W/ TWL XL LVL3 (GOWN DISPOSABLE) ×5 IMPLANT
GOWN STRL REUS W/TWL LRG LVL3 (GOWN DISPOSABLE) ×9
GOWN STRL REUS W/TWL XL LVL3 (GOWN DISPOSABLE) ×9
GRAFT PROPATEN W/RING 6X80X60 (Vascular Products) ×2 IMPLANT
HEMOSTAT SNOW SURGICEL 2X4 (HEMOSTASIS) ×4 IMPLANT
HEMOSTAT SPONGE AVITENE ULTRA (HEMOSTASIS) IMPLANT
INSERT FOGARTY SM (MISCELLANEOUS) IMPLANT
KIT BASIN OR (CUSTOM PROCEDURE TRAY) ×3 IMPLANT
KIT TURNOVER KIT B (KITS) ×3 IMPLANT
MARKER SKIN DUAL TIP RULER LAB (MISCELLANEOUS) ×2 IMPLANT
NS IRRIG 1000ML POUR BTL (IV SOLUTION) ×6 IMPLANT
PACK PERIPHERAL VASCULAR (CUSTOM PROCEDURE TRAY) ×3 IMPLANT
PAD ARMBOARD 7.5X6 YLW CONV (MISCELLANEOUS) ×6 IMPLANT
PENCIL BUTTON HOLSTER BLD 10FT (ELECTRODE) ×2 IMPLANT
SET MICROPUNCTURE 5F STIFF (MISCELLANEOUS) IMPLANT
SPONGE GAUZE 2X2 STER 10/PKG (GAUZE/BANDAGES/DRESSINGS) ×1
SPONGE LAP 18X18 X RAY DECT (DISPOSABLE) ×2 IMPLANT
STOPCOCK 4 WAY LG BORE MALE ST (IV SETS) IMPLANT
SUT ETHILON 3 0 PS 1 (SUTURE) IMPLANT
SUT GORETEX 5 0 TT13 24 (SUTURE) IMPLANT
SUT GORETEX 6.0 TT13 (SUTURE) IMPLANT
SUT MNCRL AB 4-0 PS2 18 (SUTURE) ×7 IMPLANT
SUT PROLENE 5 0 C 1 24 (SUTURE) ×21 IMPLANT
SUT PROLENE 6 0 BV (SUTURE) ×9 IMPLANT
SUT PROLENE 7 0 BV 1 (SUTURE) IMPLANT
SUT SILK 2 0 PERMA HAND 18 BK (SUTURE) IMPLANT
SUT SILK 2 0 SH (SUTURE) ×2 IMPLANT
SUT SILK 3 0 (SUTURE) ×3
SUT SILK 3-0 18XBRD TIE 12 (SUTURE) ×1 IMPLANT
SUT VIC AB 2-0 CT1 27 (SUTURE) ×12
SUT VIC AB 2-0 CT1 TAPERPNT 27 (SUTURE) ×6 IMPLANT
SUT VIC AB 3-0 SH 27 (SUTURE) ×9
SUT VIC AB 3-0 SH 27X BRD (SUTURE) ×6 IMPLANT
SYR 3ML LL SCALE MARK (SYRINGE) ×4 IMPLANT
TAPE UMBILICAL COTTON 1/8X30 (MISCELLANEOUS) IMPLANT
TOWEL GREEN STERILE (TOWEL DISPOSABLE) ×5 IMPLANT
TRAY FOLEY MTR SLVR 16FR STAT (SET/KITS/TRAYS/PACK) ×1 IMPLANT
TUBING EXTENTION W/L.L. (IV SETS) IMPLANT
UNDERPAD 30X36 HEAVY ABSORB (UNDERPADS AND DIAPERS) ×3 IMPLANT
WATER STERILE IRR 1000ML POUR (IV SOLUTION) ×3 IMPLANT

## 2020-07-10 SURGICAL SUPPLY — 8 items
CATH QUICKCROSS .035X135CM (MICROCATHETER) ×1 IMPLANT
DEVICE CONTINUOUS FLUSH (MISCELLANEOUS) ×1 IMPLANT
KIT PV (KITS) ×1 IMPLANT
SHEATH PINNACLE 7F 10CM (SHEATH) ×1 IMPLANT
SHEATH PINNACLE ST 7F 45CM (SHEATH) ×1 IMPLANT
TRAY PV CATH (CUSTOM PROCEDURE TRAY) ×1 IMPLANT
WIRE AMPLATZ SSTIFF .035X260CM (WIRE) ×1 IMPLANT
WIRE SPARTACORE .014X300CM (WIRE) ×1 IMPLANT

## 2020-07-10 NOTE — Progress Notes (Signed)
ANTICOAGULATION CONSULT NOTE  Pharmacy Consult for heparin Indication: VTE treatment    Assessment: 89 yom with known hx of L common femoral to PT bypass in 2019 requiring thrombolysis and mechanical thrombectomy in March 2021. Was discharged on apixaban but hasn't been able to afford so stopped taking.   Now s/p aortogram 9/8 with initiation of thrombolysis with tPA. Hgb 13.3, plt 230. No s/sx of bleeding. On heparin infusion at 800 units/hr from procedure.  Heparin level came back at 0.57 ~2 hours after start - did receive 10,000 units in procedure today.   Heparin level remains undetectable.  No overt bleeding or complications noted.   Goal of Therapy:  Heparin level 0.2-0.5 units/ml Monitor platelets by anticoagulation protocol: Yes   Plan:  Increase IV heparin to 1200 units/hr. Order heparin level in 6 hours  Monitor daily HL, CBC, and for s/sx of bleeding   Thanks for allowing pharmacy to be a part of this patient's care.  Talbert Cage, PharmD Clinical Pharmacist

## 2020-07-10 NOTE — Progress Notes (Signed)
Pt refused CPAP tonight. RT will continue to monitor.

## 2020-07-10 NOTE — Anesthesia Procedure Notes (Signed)
Procedure Name: Intubation Date/Time: 07/10/2020 2:26 PM Performed by: Babs Bertin, CRNA Pre-anesthesia Checklist: Patient identified, Emergency Drugs available, Suction available and Patient being monitored Patient Re-evaluated:Patient Re-evaluated prior to induction Oxygen Delivery Method: Circle System Utilized Preoxygenation: Pre-oxygenation with 100% oxygen Induction Type: IV induction Ventilation: Mask ventilation without difficulty Laryngoscope Size: Mac and 3 Grade View: Grade II Tube type: Oral Tube size: 7.5 mm Number of attempts: 1 Airway Equipment and Method: Stylet and Oral airway Placement Confirmation: ETT inserted through vocal cords under direct vision,  positive ETCO2 and breath sounds checked- equal and bilateral Secured at: 23 cm Tube secured with: Tape Dental Injury: Teeth and Oropharynx as per pre-operative assessment

## 2020-07-10 NOTE — Progress Notes (Signed)
ANTICOAGULATION CONSULT NOTE  Pharmacy Consult for Heparin Indication: DVT  Allergies  Allergen Reactions   Lisinopril Other (See Comments)    Syncope   Codeine Rash    Patient Measurements: Height: 5\' 9"  (175.3 cm) Weight: 102.1 kg (225 lb) IBW/kg (Calculated) : 70.7  Vital Signs: Temp: 98.1 F (36.7 C) (09/09 0645) Temp Source: Oral (09/09 0645) BP: 134/86 (09/09 0700) Pulse Rate: 72 (09/09 0700)  Labs: Recent Labs    07/09/20 0833 07/09/20 1332 07/09/20 1748 07/09/20 1748 07/10/20 0049 07/10/20 0528 07/10/20 0910  HGB 13.3   < > 12.7*   < > 12.1* 12.9*  --   HCT 39.0   < > 39.9  --  38.4* 40.3  --   PLT  --    < > 272  --  251 269  --   HEPARINUNFRC  --    < > <0.10*   < > <0.10* <0.10* <0.10*  CREATININE 1.10  --   --   --   --  1.34*  --    < > = values in this interval not displayed.    Estimated Creatinine Clearance: 67.3 mL/min (A) (by C-G formula based on SCr of 1.34 mg/dL (H)).  7 yom with known hx of L common femoral to PT bypass in 2019 requiring thrombolysis and mechanical thrombectomy in March 2021. Was discharged on apixaban but hasn't been able to afford so stopped taking.   Now s/p aortogram 9/8 with initiation of thrombolysis with tPA.   Assessment: H/H down slightly, Plt stable. No signs of bleeding or issues with line per patient and nursing. Heparin level continues to be untectable. No bolus indicated due to tPA infusion. Will moderate increase heparin rate and check 6 hour level.  Goal of Therapy:  Heparin level 0.2-0.5 units/ml Monitor platelets by anticoagulation protocol: Yes   Plan:  Increase IV heparin to 1400 units/hr. Order heparin level in 6 hours  Monitor daily HL, CBC, and for s/sx of bleeding   11/8, PharmD PGY1 Pharmacy Resident 07/10/2020 10:52 AM

## 2020-07-10 NOTE — Transfer of Care (Signed)
Immediate Anesthesia Transfer of Care Note  Patient: Robert Sanchez  Procedure(s) Performed: REDO EXPOSURE OF LEFT COMMON FEMORAL ARTERY LEFT COMMON FEMORAL TO POSTERIOR TIBIAL COMPOSITE BYPASS GRAFT (Left Groin) THROMBECTOMY OF LEFT EXTERNAL ILIAC AND LEFT COMMON FEMORAL AND LEFT POSTERIOR TIBIAL ARTERIES (Left Groin)  Patient Location: PACU  Anesthesia Type:General  Level of Consciousness: awake and alert   Airway & Oxygen Therapy: Patient Spontanous Breathing and Patient connected to nasal cannula oxygen  Post-op Assessment: Report given to RN and Post -op Vital signs reviewed and stable  Post vital signs: Reviewed and stable  Last Vitals:  Vitals Value Taken Time  BP 138/81 07/10/20 1802  Temp    Pulse 84 07/10/20 1807  Resp 21 07/10/20 1807  SpO2 92 % 07/10/20 1807  Vitals shown include unvalidated device data.  Last Pain:  Vitals:   07/10/20 1227  TempSrc:   PainSc: 3       Patients Stated Pain Goal: 2 (07/10/20 1337)  Complications: No complications documented.

## 2020-07-10 NOTE — Op Note (Signed)
Patient name: Robert Sanchez MRN: [id number] DOB: January 12, 1956 Sex: male  07/10/2020 Pre-operative Diagnosis: Occluded left common femoral to posterior tibial artery bypass with critical limb ischemia and tissue loss Post-operative diagnosis:  Same Surgeon:  Cephus Shelling, MD Procedure Performed: 1.  Thrombolytics catheter check of the left lower extremity 2.  Left lower extremity arteriogram with selection of third order branches 3.  Right femoral arteriogram   Indications: 64 year old male previously underwent a left common femoral to posterior tibial bypass for critical limb ischemia of the left lower extremity with rest pain in 2019..  Bypass occluded back in March when he was medically noncompliant and he developed left great toe ulcer.  We were  ultimately able to salvage his left leg bypass in March with thrombolysis and percutaneous mechanical thrombectomy.  He presented to clinic on Tuesday for surveillance and found to have silent occlusion of his bypass of unclear duration.  Thrombolytics catheter was placed down the bypass and he was placed in the ICU overnight.  Findings:   The sheath in the right groin over the aortic bifurcation into the left common femoral artery would not draw back.  We subsequently did a sheath exchange in the right groin again placed over the aortic bifurcation and into the left common femoral artery.  Again even with a new sheath it would not draw back.  I then did a left iliac arteriogram that showed thrombus distal to the left external iliac stent as well as thrombus in the common femoral and possible concern for dissection.  There was no flow in the left common femoral to peroneal bypass.  You could see a proximal SFA and profunda with reconstitution of at least a diseased popliteal artery/TP trunk and at least two-vessel runoff via peroneal and posterior tibial artery but likely underfilling.    No thrombus seen in right femoral artery and proximal  SFA occlusion with profunda runoff.   Procedure:  The patient was identified in the holding area and taken to room 8.  The patient was then placed supine on the table and prepped and draped in the usual sterile fashion.  A time out was called.  I initially removed the inner cannula of the UniFuse catheter and placed a 014 [place] core wire down the left common femoral to PT bypass.  Subsequently removed the UniFuse catheter over the wire.  Went to draw back on the sheath in order to check an ACT and the sheath would not draw back.  I then went back down and exchanged over a long quick cross for a Amplatz wire for more support.  Again placed a new 7 [place] destination sheath in the right groin over the aortic bifurcation.  Again this would not draw back.  I then pulled the sheath back into the common iliac artery on the left and obtained a hand-injection that showed what looked like acute thrombus in the distal external iliac common femoral with reconstitution of the profunda and proximal SFA stump and possible dissection.  There was no flow in the bypass after thrombolysis overnight even though we had a wire down the bypass.  I did see reconstitution of at least a diseased popliteal artery/TP trunk with PT and peroneal runoff.  Ultimately I do not think he has any endovascular options at this time.  I recommended proceeding to the operating room for left common femoral endarterectomy and left femoral to distal bypass likely with prosthetic.  I did remove wires and catheters in  the left lower extremity bypass and put a short 7 French sheath in the right groin.  Patient was given another 5000 units heparin. He will be taken to OR holding.    Cephus Shelling, MD [facility] Office: [phone]

## 2020-07-10 NOTE — Progress Notes (Signed)
Received pt to 4E03 from PACU. VSS. CHG bath and foley care done. Tele applied and verified x2. R groin + L leg incisions CDI. L foot PT pulse dopperable. No specific complaints.  Oriented pt to room and call light. Pt refused to have me call and update family- gave him cell phone so he could do so himself. Will continue to monitor.  Margarito Liner, RN

## 2020-07-10 NOTE — Op Note (Signed)
Date: July 10, 2020  Preoperative diagnosis: Acute on chronic limb ischemia of the left lower extremity with occluded left common femoral to posterior tibial artery bypass previously performed with vein graft  Postoperative diagnosis: Same  Procedure: 1.  Redo exposure of left common femoral artery 2.  Left external iliac artery and common femoral artery thrombectomy 3.  Left posterior tibial artery thrombectomy 4.  Redo left common femoral to posterior tibial artery bypass with composite graft (6 mm ringed PTFE)  Surgeon: Dr. Cephus Shelling, MD  Assistant: Dr. Lemar Livings, MD and Wendi Maya, PA  Indication: Patient is a 64 year old male who presented to clinic on Tuesday with evidence of an occluded left common femoral to posterior tibial artery bypass with vein performed in 2019.  This previously occluded earlier this year in March and we were able to salvage the bypass with lysis and percutaneous thrombectomy.  He has been non-compliant with his anticoagulation for high risk bypass.  He has had ongoing tissue loss with a large ulcer on his left great toe.  Ultimately recommended admission for attempted thrombolysis and graft salvage.  He underwent thrombolysis catheter placement in his occluded bypass yesterday and on takeback today had some worsening ischemia of his foot and no evidence of flow in the bypass after thrombolysis as well as evidence of acute thrombus in both the external iliac and common femoral artery in the left where the distal sheath ws placed.  He presents after risk benefits discussed.  An assistant was needed given the complexity of the case and to get this done in a timely fashion.  Findings: Redo exposure of left common femoral artery.  The vein appeared very sclerotic and not salvageable at the proximal anastomosis.  Ultimately the previous bypass was ligated in the groin and once we got the common femoral as well as the SFA and profunda dissected out the  bypass was detached from the common femoral.  We are able to retrieve a large plug of acute appearing thrombus in the common femoral and still had very poor inflow so with a #4 Fogarty I was able to get additional thrombus from the external iliac with good inflow.  No evidence of dissection.  We subsequently tunneled a new bypass graft with 6 mm ringed PTFE from the below-knee popliteal artery exposure up to the common femoral in subfascial plane.  Ultimately the graft was then anastomosed to the vein bypass in the left calf in end to end fashion where the vein bypass was much more robust and healthy in the distal calf (without having to redo the distal anastomosis to the posterior tibial artery with graft).  The proximal was sewn end to side to the common femoral artery.  We did pass and Fogarty into the foot and retrieved a small amount of thrombus.  Excellent signal upon completion.  Anesthesia: General  EBL: 800 mL  Details: Patient was taken to the operating room after informed consent was obtained.  Placed on operative table in supine position.  General endotracheal anesthesia was induced.  The left groin and left leg were prepped draped usual sterile fashion.  I initially started in the left groin and ultimately a vertical groin incision was made and dissected through the subcutaneous tissue through old scar tissue identified the inguinal ligament and ultimately was able to dissect out the common femoral artery as well as SFA and profunda and distal external iliac artery and controlled all with Vesseloops as well as the old bypass.  At the same time my partner Dr. Randie Heinz reopened the below-knee popliteal artery exposure and immediately was able to identify the vein graft that looked much healthier below the knee.  There was not a very good left femoral pulse and had to be identified with Doppler.  Once we further inspected the bypass off the common femoral it appeared this was not going to be salvageable  given the vein appeared very sclerotic proximally (and this was previously reversed).  At that point we ligated the vein bypass proximally in the groin and that was transected.  Then tunneled from the below-knee popliteal artery up to the common femoral in subsartorial subfascial plane and passed a 6 mm ringed PTFE graft through the tunneler.  Ultimately the patient was given 100 units per kilogram heparin.  We got distal control of the external iliac with Henley clamp and control the SFA and profunda with Vesseloops.  The common femoral was then opened by taking the hood of the old bypass off the common femoral.  Immediately there was fresh thrombus that we removed.  When I went to flush the artery we had fairly poor inflow.  I then passed a #4 Fogarty proximally several times get additional large plugs of acute thrombus and then had excellent inflow.  The proximal graft was spatulated and sewn end-to-side to the common femoral artery with a 5-0 Prolene in parachute technique.  While I was doing this anastomosis my partner Dr. Randie Heinz performed an end to end anastomosis to the saphenous vein graft in the below-knee popliteal incision to the previous vein graft to create a composite graft given that the vein looks much healthier distally and this would prevent [place] from having to sew a large graft to a very small artery.  We de-aired the thing at prior to completion.  Once we came off clamps we had a palpable pulse in the bypass distally as well as a good posterior tibial signal in the foot.  Patient was given 50 mg protamine for reversal.  Hemostasis was achieved with Bovie cautery as well as Surgicel snow.  Both incisions were washed out and closed with multiple layers of 2-0 Vicryl, 3-0 Vicryl, 4-0 Monocryl and Dermabond.  At the end of the case we had one of our nurses remove the a 7 French sheath in the right common femoral artery with manual pressure.  Cephus Shelling, MD [facility]  [place] Office: [phone]   Cephus Shelling

## 2020-07-10 NOTE — Anesthesia Preprocedure Evaluation (Addendum)
Anesthesia Evaluation  Patient identified by MRN, date of birth, ID band Patient awake    Reviewed: Allergy & Precautions, NPO status , Patient's Chart, lab work & pertinent test results  Airway Mallampati: II  TM Distance: >3 FB Neck ROM: Full    Dental  (+) Edentulous Upper, Edentulous Lower   Pulmonary COPD, Patient abstained from smoking., former smoker,    Pulmonary exam normal breath sounds clear to auscultation       Cardiovascular + CAD, + Past MI, + CABG, + Peripheral Vascular Disease and +CHF  Normal cardiovascular exam Rhythm:Regular Rate:Normal  ECHO: Post-Op TEE  AORTA Aorta unchanged from pre-bypass.   LEFT VENTRICLE EF post - op is 35-40%.     RIGHT VENTRICLE Post-op, RV cavity size is mildly dilated.   AORTIC VALVE Aortic valve unchanged from pre-bypass MITRAL VALVE Mitral valve unchanged from pre-bypass.   TRICUSPID VALVE Tricuspid valve unchanged from pre-bypass: mild TR.     Neuro/Psych negative neurological ROS  negative psych ROS   GI/Hepatic Neg liver ROS, GERD  ,  Endo/Other  diabetes, Oral Hypoglycemic Agents  Renal/GU Renal disease     Musculoskeletal negative musculoskeletal ROS (+)   Abdominal (+) + obese,   Peds  Hematology  (+) anemia , HLD   Anesthesia Other Findings Ischemic left leg  Reproductive/Obstetrics                            Anesthesia Physical Anesthesia Plan  ASA: IV and emergent  Anesthesia Plan: General   Post-op Pain Management:    Induction: Intravenous  PONV Risk Score and Plan: 2 and Ondansetron, Dexamethasone and Treatment may vary due to age or medical condition  Airway Management Planned: Oral ETT  Additional Equipment: Arterial line  Intra-op Plan:   Post-operative Plan: Extubation in OR  Informed Consent: I have reviewed the patients History and Physical, chart, labs and discussed the procedure including the  risks, benefits and alternatives for the proposed anesthesia with the patient or authorized representative who has indicated his/her understanding and acceptance.       Plan Discussed with: CRNA and Surgeon  Anesthesia Plan Comments:        Anesthesia Quick Evaluation

## 2020-07-10 NOTE — Progress Notes (Signed)
Left common femoral to PT bypass remains occluded.  There has been no improvement with thrombolysis overnight.  He now has either clot or dissection in the common femoral.  I recommended proceeding to the OR for common femoral endarterectomy and either an attempt at thrombectomy of his bypass versus a new distal bypass with prosthetic.  We discussed he is at exceedingly high risk for limb loss.  As previously noted has been medically noncompliant.  Cephus Shelling, MD [facility] Office: [phone]   Cephus Shelling

## 2020-07-10 NOTE — Progress Notes (Signed)
[facility]  Subjective  - no complaints.  No acute events overnight in ICU.   Objective 134/86 72 98.1 F (36.7 C) (Oral) 20 94%  Intake/Output Summary (Last 24 hours) at 07/10/2020 0844 Last data filed at 07/10/2020 0700 Gross per 24 hour  Intake 1514.08 ml  Output 1975 ml  Net -460.92 ml    Right groin sheath c/d/i Right DP signal Left PT signal Left foot motor intact  Laboratory Lab Results: Recent Labs    07/10/20 0049 07/10/20 0528  WBC 9.5 9.9  HGB 12.1* 12.9*  HCT 38.4* 40.3  PLT 251 269   BMET Recent Labs    07/09/20 0833 07/10/20 0528  NA 140 136  K 4.4 4.7  CL 105 105  CO2  --  23  GLUCOSE 169* 114*  BUN 26* 19  CREATININE 1.10 1.34*  CALCIUM  --  8.6*    COAG Lab Results  Component Value Date   INR 1.2 01/16/2020   INR 1.19 08/17/2018   INR 1.45 06/12/2018   No results found for: PTT  Assessment/Planning:  64 year old male that underwent thrombolysis of left common femoral to PT bypass overnight in ICU.  No acute events.  Continue thrombolysis this morning and on the schedule for thrombolysis catheter check today.  I discussed with him that given ongoing medical noncompliance and unclear duration of bypass occlusion it may be difficult to salvage his bypass but we will attempt  Robert Sanchez 07/10/2020 8:44 AM --

## 2020-07-10 NOTE — Progress Notes (Signed)
Pt transferred to cath lab room 8 via bed on tele monitoring with transporter. Pt's belongings with pt.

## 2020-07-10 NOTE — Brief Op Note (Signed)
Sheath pulled by Vallarie Mare, BSN, RN, CNOR and manual pressure held for 21 minutes from 1714 to  1734.  Hemo statis achieved. Site dry and intact.  Dressed with 2"x2" gauze and tegaderm. PT pulse located and marked.

## 2020-07-11 ENCOUNTER — Encounter (HOSPITAL_COMMUNITY): Payer: Self-pay | Admitting: Vascular Surgery

## 2020-07-11 LAB — BASIC METABOLIC PANEL
Anion gap: 10 (ref 5–15)
BUN: 15 mg/dL (ref 8–23)
CO2: 20 mmol/L — ABNORMAL LOW (ref 22–32)
Calcium: 8.3 mg/dL — ABNORMAL LOW (ref 8.9–10.3)
Chloride: 105 mmol/L (ref 98–111)
Creatinine, Ser: 1.03 mg/dL (ref 0.61–1.24)
GFR calc Af Amer: >60 mL/min (ref >60)
GFR calc non Af Amer: >60 mL/min (ref >60)
Glucose, Bld: 129 mg/dL — ABNORMAL HIGH (ref 70–99)
Potassium: 4.2 mmol/L (ref 3.5–5.1)
Sodium: 135 mmol/L (ref 135–145)

## 2020-07-11 LAB — HEPARIN LEVEL (UNFRACTIONATED)
Heparin Unfractionated: 0.35 IU/mL (ref 0.30–0.70)
Heparin Unfractionated: 0.33 IU/mL (ref 0.30–0.70)

## 2020-07-11 LAB — CBC
HCT: 33.4 % — ABNORMAL LOW (ref 39.0–52.0)
Hemoglobin: 11.1 g/dL — ABNORMAL LOW (ref 13.0–17.0)
MCH: 30.4 pg (ref 26.0–34.0)
MCHC: 33.2 g/dL (ref 30.0–36.0)
MCV: 91.5 fL (ref 80.0–100.0)
Platelets: 253 10*3/uL (ref 150–400)
RBC: 3.65 MIL/uL — ABNORMAL LOW (ref 4.22–5.81)
RDW: 13.9 % (ref 11.5–15.5)
WBC: 10.2 10*3/uL (ref 4.0–10.5)
nRBC: 0 % (ref 0.0–0.2)

## 2020-07-11 LAB — LIPID PANEL
Cholesterol: 91 mg/dL (ref 0–200)
HDL: 19 mg/dL — ABNORMAL LOW (ref >40)
LDL Cholesterol: 52 mg/dL (ref 0–99)
Total CHOL/HDL Ratio: 4.8 RATIO
Triglycerides: 99 mg/dL (ref <150)
VLDL: 20 mg/dL (ref 0–40)

## 2020-07-11 LAB — GLUCOSE, CAPILLARY
Glucose-Capillary: 184 mg/dL — ABNORMAL HIGH (ref 70–99)
Glucose-Capillary: 103 mg/dL — ABNORMAL HIGH (ref 70–99)
Glucose-Capillary: 113 mg/dL — ABNORMAL HIGH (ref 70–99)
Glucose-Capillary: 125 mg/dL — ABNORMAL HIGH (ref 70–99)

## 2020-07-11 MED ORDER — HEPARIN (PORCINE) 25000 UT/250ML-% IV SOLN
1800.0000 [IU]/h | INTRAVENOUS | Status: DC
  Administered 2020-07-11 – 2020-07-12 (×2): 1500 [IU]/h via INTRAVENOUS
  Administered 2020-07-14: 1800 [IU]/h via INTRAVENOUS
  Filled 2020-07-11 (×5): qty 250

## 2020-07-11 MED FILL — Lidocaine HCl Local Inj 1%: INTRAMUSCULAR | Qty: 20 | Status: AC

## 2020-07-11 NOTE — Evaluation (Signed)
Physical Therapy Evaluation Patient Details Name: Robert Sanchez MRN: [id number] DOB: Dec 13, 1955 Today's Date: 07/11/2020   History of Present Illness  64 yo admitted with LLE ischemia s/Sanchez redo left fempop BPG with left iliac/femoral/tibial artery thrombectomy. PMHx: left fem pop BPG 08/21/18 with thrombectomy 12/2019, COPD, DM, neuropathy, HOH  Clinical Impression  Pt initially very agreeable to getting OOB and seeing how he does with moving. However, once mobility initiated pt reported 10/10 pain in LLE and denied mobility beyond standing and limited HEP. Pt aware of benefit of mobility but states it is too early and he won't really walk until tomorrow. Pt educated for transfers, HEP and progression. Pt with decreased strength, ROM, transfers and mobility who will benefit from acute therapy to maximize mobility, safety and independence.       Follow Up Recommendations Home health PT;Supervision - Intermittent    Equipment Recommendations  3in1 (PT)    Recommendations for Other Services OT consult     Precautions / Restrictions Precautions Precautions: Fall      Mobility  Bed Mobility Overal bed mobility: Needs Assistance Bed Mobility: Supine to Sit;Sit to Supine     Supine to sit: Min assist Sit to supine: Min guard   General bed mobility comments: pt initially attempting to exit toward left of bed as he does at home but even with assist unable. pt then transitioned to exiting right side with min assist and increased time to achieve fully upright. Return to bed with minguard and increased time with pt ignoring cues to hook LLE with RLE  Transfers Overall transfer level: Needs assistance   Transfers: Sit to/from Stand Sit to Stand: Min guard         General transfer comment: pt able to stand from EOB to walker x 2 with minor side step toward HOB. Pt declined attempting gait  Ambulation/Gait                Stairs            Wheelchair Mobility     Modified Rankin (Stroke Patients Only)       Balance                                             Pertinent Vitals/Pain Pain Assessment: 0-10 Pain Score: 10-Worst pain ever Pain Location: LLE particularly groin Pain Descriptors / Indicators: Aching;Burning Pain Intervention(s): Limited activity within patient's tolerance;Premedicated before session;Repositioned    Home Living Family/patient expects to be discharged to:: Private residence Living Arrangements: Alone Available Help at Discharge: Friend(s);Available PRN/intermittently Type of Home: House Home Access: Stairs to enter   Entrance Stairs-Number of Steps: 2 Home Layout: One level Home Equipment: Walker - 2 wheels;Cane - single point      Prior Function Level of Independence: Independent               Hand Dominance        Extremity/Trunk Assessment   Upper Extremity Assessment Upper Extremity Assessment: Overall WFL for tasks assessed    Lower Extremity Assessment Lower Extremity Assessment: LLE deficits/detail LLE Deficits / Details: decreased ROm and strength due to pain    Cervical / Trunk Assessment Cervical / Trunk Assessment: Normal  Communication   Communication: HOH  Cognition Arousal/Alertness: Awake/alert Behavior During Therapy: WFL for tasks assessed/performed Overall Cognitive Status: Impaired/Different from baseline Area of Impairment: Memory  Memory: Decreased short-term memory         General Comments: Pt initally very agreeable to walking but then after sitting stated he wasn't ready and couldn't attempt      General Comments      Exercises General Exercises - Lower Extremity Long Arc Quad: AAROM;Left;Seated;5 reps   Assessment/Plan    PT Assessment Patient needs continued PT services  PT Problem List Decreased strength;Decreased mobility;Decreased safety awareness;Decreased activity tolerance;Decreased range of  motion;Decreased balance;Decreased knowledge of use of DME;Pain;Decreased cognition       PT Treatment Interventions DME instruction;Therapeutic exercise;Gait training;Functional mobility training;Therapeutic activities;Patient/family education;Stair training;Balance training    PT Goals (Current goals can be found in the Care Plan section)  Acute Rehab PT Goals Patient Stated Goal: return home PT Goal Formulation: With patient Time For Goal Achievement: 07/25/20 Potential to Achieve Goals: Fair    Frequency Min 3X/week   Barriers to discharge Decreased caregiver support      Co-evaluation               AM-PAC PT "6 Clicks" Mobility  Outcome Measure Help needed turning from your back to your side while in a flat bed without using bedrails?: A Little Help needed moving from lying on your back to sitting on the side of a flat bed without using bedrails?: A Little Help needed moving to and from a bed to a chair (including a wheelchair)?: A Little Help needed standing up from a chair using your arms (e.g., wheelchair or bedside chair)?: A Little Help needed to walk in hospital room?: A Lot Help needed climbing 3-5 steps with a railing? : Total 6 Click Score: 15    End of Session   Activity Tolerance: Patient limited by pain Patient left: in bed;with call bell/phone within reach Nurse Communication: Mobility status PT Visit Diagnosis: Other abnormalities of gait and mobility (R26.89);Muscle weakness (generalized) (M62.81);Difficulty in walking, not elsewhere classified (R26.2)    Time: 9767-3419 PT Time Calculation (min) (ACUTE ONLY): 34 min   Charges:   PT Evaluation $PT Eval Moderate Complexity: 1 Mod          Robert Sanchez, PT Acute Rehabilitation Services Pager: [phone] Office: [phone]   Enedina Finner Carliss Quast 07/11/2020, 8:33 AM

## 2020-07-11 NOTE — Progress Notes (Signed)
ANTICOAGULATION CONSULT NOTE - Follow Up Consult  Pharmacy Consult for Heparin Indication: at risk of LLE graft thrombosis  Allergies  Allergen Reactions  . Lisinopril Other (See Comments)    Syncope  . Codeine Rash    Patient Measurements: Height: 5\' 9"  (175.3 cm) Weight: 102.1 kg (225 lb) IBW/kg (Calculated) : 70.7 Heparin Dosing Weight: 93 kg  Vital Signs: Temp: 98.3 F (36.8 C) (09/10 0806) Temp Source: Oral (09/10 0806) BP: 131/71 (09/10 0806) Pulse Rate: 90 (09/10 0806)  Labs: Recent Labs    07/09/20 0833 07/09/20 1332 07/10/20 0049 07/10/20 0049 07/10/20 0528 07/10/20 0910 07/11/20 0240  HGB 13.3   < > 12.1*   < > 12.9*  --  11.1*  HCT 39.0   < > 38.4*  --  40.3  --  33.4*  PLT  --    < > 251  --  269  --  253  HEPARINUNFRC  --    < > <0.10*  --  <0.10* <0.10*  --   CREATININE 1.10  --   --   --  1.34*  --  1.03   < > = values in this interval not displayed.    Estimated Creatinine Clearance: 87.6 mL/min (by C-G formula based on SCr of 1.03 mg/dL).  Assessment:  81 yom with known hx of L common femoral to PT bypass in 2019 requiring thrombolysis and mechanical thrombectomy in March 2021. Was discharged on apixaban but unable to afford so stopped taking.    S/p aortogram 9/8 with initiation of thrombolysis with tPA and IV heparin but no improvement. To OR 9/9 for thrombectomy and re-do PT bypass and graft.  No incision site hematomas noted.  To resume IV heparin without bolus due to at risk of graft thrombosis.  Now on ASA 81 mg and Plavix.    Last heparin level as < 0.10 on 1200 units/hr on 9/9 am, while on tPA.   Goal of Therapy:  Heparin level 0.3-0.7 units/ml Monitor platelets by anticoagulation protocol: Yes   Plan:   Resume heparin drip at 1500 units/hr.  Heparin level ~8 hrs after drip resumes.  Daily heparin level and CBC while on heparin.  11/9, RPh Phone: [phone] 07/11/2020,8:18 AM

## 2020-07-11 NOTE — Progress Notes (Addendum)
Progress Note    07/11/2020 7:31 AM 1 Day Post-Op  Subjective:  No complaints. Tolerating diet and voiding spontaneously. Says his left foot pain is improved. Dr. Chestine Spore and PT at bedside.   Vitals:   07/11/20 0432 07/11/20 0727  BP: 132/71   Pulse: 88 82  Resp: 17   Temp: 98 F (36.7 C)   SpO2: 94%     Physical Exam: Cardiac:  RRR Lungs:  CTAB Incisions:  Left groin and BK incisions well approximated without hematoma. Left groin soft; no hematoma Extremities:  Left foot: intact motor and sensation. Biphasic doppler signal in distal graft and dampened left PT signal   CBC    Component Value Date/Time   WBC 10.2 07/11/2020 0240   RBC 3.65 (L) 07/11/2020 0240   HGB 11.1 (L) 07/11/2020 0240   HCT 33.4 (L) 07/11/2020 0240   PLT 253 07/11/2020 0240   MCV 91.5 07/11/2020 0240   MCH 30.4 07/11/2020 0240   MCHC 33.2 07/11/2020 0240   RDW 13.9 07/11/2020 0240   LYMPHSABS 1.5 01/15/2020 0750   MONOABS 1.2 (H) 01/15/2020 0750   EOSABS 0.2 01/15/2020 0750   BASOSABS 0.1 01/15/2020 0750    BMET    Component Value Date/Time   NA 135 07/11/2020 0240   NA 137 06/27/2020 1216   K 4.2 07/11/2020 0240   CL 105 07/11/2020 0240   CO2 20 (L) 07/11/2020 0240   GLUCOSE 129 (H) 07/11/2020 0240   BUN 15 07/11/2020 0240   BUN 17 06/27/2020 1216   CREATININE 1.03 07/11/2020 0240   CREATININE 0.81 08/14/2014 1126   CALCIUM 8.3 (L) 07/11/2020 0240   GFRNONAA >60 07/11/2020 0240   GFRNONAA >89 08/14/2014 1126   GFRAA >60 07/11/2020 0240   GFRAA >89 08/14/2014 1126     Intake/Output Summary (Last 24 hours) at 07/11/2020 0731 Last data filed at 07/11/2020 0640 Gross per 24 hour  Intake 3194.88 ml  Output 2870 ml  Net 324.88 ml    HOSPITAL MEDICATIONS Scheduled Meds: . aspirin EC  81 mg Oral Q0600  . atorvastatin  40 mg Oral QHS  . carvedilol  6.25 mg Oral BID WC  . Chlorhexidine Gluconate Cloth  6 each Topical Daily  . clopidogrel  75 mg Oral Daily  . docusate sodium   100 mg Oral Daily  . glipiZIDE  10 mg Oral BID AC  . insulin aspart  0-15 Units Subcutaneous TID WC  . pantoprazole  40 mg Oral Daily  . sodium chloride flush  3 mL Intravenous Q12H   Continuous Infusions: . sodium chloride Stopped (07/11/20 0348)  . sodium chloride    . sodium chloride Stopped (07/11/20 0345)  . magnesium sulfate bolus IVPB     PRN Meds:.sodium chloride, sodium chloride, acetaminophen **OR** acetaminophen, alum & mag hydroxide-simeth, bisacodyl, guaiFENesin-dextromethorphan, hydrALAZINE, labetalol, magnesium sulfate bolus IVPB, metoprolol tartrate, midazolam, morphine injection, ondansetron (ZOFRAN) IV, oxyCODONE-acetaminophen, phenol, polyethylene glycol, potassium chloride, sodium chloride flush  Assessment: 64 year old male with acute on chronic limb ischemia with occluded left common femoral to posterior tibial artery bypass.  Postoperative day 1 redo exposure of left common femoral artery left PT, EIA and CFA thrombectomy; redo left common femoral to PT artery bypass with PTFE graft.     POD 1: VSS. Afebrile. Excellent UOP. Hgb and Cr stable. Left PT Doppler signal dampened. Left GT ulcer is dry.   Plan: -Continue aspirin, Plavix and statin.  Will begin heparin infusion with no bolus as he is at risk  of graft thrombosis. OOB. -DVT prophylaxis:  Heparin infusion now   Wendi Maya, PA-C [facility] [phone] 07/11/2020  7:31 AM   I have seen and evaluated the patient. I agree with the PA note as documented above.  Postop day 1 status post redo exposure of left common femoral artery with thrombectomy of the left common femoral iliac as well as a composite left common femoral to PT bypass.  This is in the setting of an occluded fem PT native vein bypass that we did 2019 that reoccluded and failed lysis.  He has a great signal in the bypass today.  His foot looks much better.  He feels like he is moving the foot better.  PT OT pain control etc.   Will start on heparin without bolus for high risk bypass.  Hgb 11.1.  Cr 1.03.  Cephus Shelling, MD [facility] [place] Office: [phone]

## 2020-07-11 NOTE — TOC Initial Note (Signed)
Transition of Care [facility]) - Initial/Assessment Note    Patient Details  Name: Robert Sanchez MRN: [id number] Date of Birth: 1956/08/02  Transition of Care [facility]) CM/SW Contact:    Nance Pear, RN Phone Number: 07/11/2020, 11:52 AM  Clinical Narrative:                 Case manager received consult for medication assistance and cost comparison for xarelto and eliquis.  Case manage noted that patient was given Eliquis 30 day co-pay assistance in March and was to apply for patient assistance for Eliquis.  Patient states that he did try to do patient assistance however was unable to in March.  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  Case manager discussed additional blood thinners with patient for Xarelto and Coumadin (brought up by patient).  Case manager did discuss and verify that patient has not been on Xarelto before and gave information on patient assistance.  Expected Discharge Plan: Home/Self Care Barriers to Discharge: Inadequate or no insurance   Patient Goals and CMS Choice Patient states their goals for this hospitalization and ongoing recovery are:: to go home.      Expected Discharge Plan and Services Expected Discharge Plan: Home/Self Care   Discharge Planning Services: CM Consult   Living arrangements for the past 2 months: Single Family Home                                      Prior Living Arrangements/Services Living arrangements for the past 2 months: Single Family Home Lives with:: Self Patient language and need for interpreter reviewed:: Yes Do you feel safe going back to the place where you live?: Yes      Need for Family Participation in Patient Care: Yes (Comment) Care giver support system in place?: Yes (comment)   Criminal Activity/Legal Involvement Pertinent to Current Situation/Hospitalization: No - Comment as needed  Activities of Daily Living Home Assistive Devices/Equipment: CBG Meter, Dentures (specify  type), Eyeglasses ADL Screening (condition at time of admission) Patient's cognitive ability adequate to safely complete daily activities?: Yes Is the patient deaf or have difficulty hearing?: No Does the patient have difficulty seeing, even when wearing glasses/contacts?: No Does the patient have difficulty concentrating, remembering, or making decisions?: No Patient able to express need for assistance with ADLs?: Yes Does the patient have difficulty dressing or bathing?: No Independently performs ADLs?: Yes (appropriate for developmental age) Does the patient have difficulty walking or climbing stairs?: Yes Weakness of Legs: Left Weakness of Arms/Hands: None  Permission Sought/Granted Permission sought to share information with : Case Manager Permission granted to share information with : Yes, Verbal Permission Granted              Emotional Assessment Appearance:: Appears stated age Attitude/Demeanor/Rapport: Engaged Affect (typically observed): Frustrated Orientation: : Oriented to Self, Oriented to Place, Oriented to  Time, Oriented to Situation Alcohol / Substance Use: Never Used Psych Involvement: No (comment)  Admission diagnosis:  PAD (peripheral artery disease) (HCC) [I73.9] Patient Active Problem List   Diagnosis Date Noted  . Cellulitis of foot 01/15/2020  . Critical ischemia of extremity with history of revascularization of same extremity 01/15/2020  . Leukocytosis 01/15/2020  . CKD (chronic kidney disease), stage III 01/15/2020  . Acute on chronic systolic CHF (congestive heart failure) (HCC) 05/04/2019  . Mild renal insufficiency 05/04/2019  . LFT elevation  05/04/2019  . PAD (peripheral artery disease) (HCC) 08/21/2018  . PVD (peripheral vascular disease) (HCC) 07/18/2018  . 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 CAD (coronary artery disease) 06/05/2018  . Pulmonary edema 06/03/2018  . DKA, type 2 (HCC) 06/03/2018  . Diabetic acidosis without coma (HCC)   . Sebaceous  cyst 08/23/2016  . Acute pain of right knee 08/23/2016  . Hidradenitis 08/23/2016  . Right hip pain 08/14/2014  . Diabetes mellitus type II, non insulin dependent (HCC) 11/27/2013  . Arthritis 11/27/2013  . Neuropathy in diabetes (HCC) 11/27/2013   PCP:  Janeece Agee, NP Pharmacy:   [facility] [address], [place] - [address] [place] [address] Phone: [phone] Fax: [phone]  [facility] - [place], [place] - [address] [place] [address] Phone: [phone] Fax: [phone]  [facility] [place], [place] - [address] [place] [address] Phone: [phone] Fax: [phone]     Social Determinants of Health (SDOH) Interventions    Readmission Risk Interventions Readmission Risk Prevention Plan 01/21/2020  Transportation Screening Complete  PCP or Specialist Appt within 5-7 Days Complete  Home Care Screening Complete  Medication Review (RN CM) Complete  Some recent data might be hidden

## 2020-07-11 NOTE — Progress Notes (Signed)
PHARMACIST LIPID MONITORING   Robert Sanchez is a 64 y.o. male admitted on 07/09/2020 with suspected occluded bypass, now s/p thrombectomy and redo PT bypass and graft. Had stopped taking Eliquis due to cost.  Pharmacy has been consulted to optimize lipid-lowering therapy with the indication of secondary prevention for clinical ASCVD.  Recent Labs:  Lipid Panel (last 6 months):   Lab Results  Component Value Date   CHOL 91 07/11/2020   TRIG 99 07/11/2020   HDL 19 (L) 07/11/2020   CHOLHDL 4.8 07/11/2020   VLDL 20 07/11/2020   LDLCALC 52 07/11/2020    Hepatic function panel (last 6 months):   Lab Results  Component Value Date   AST 13 06/27/2020   ALT 13 06/27/2020   ALKPHOS 86 06/27/2020   BILITOT 0.4 06/27/2020    SCr (since admission):   Serum creatinine: 1.03 mg/dL 19/75/88 3254 Estimated creatinine clearance: 87.6 mL/min  Current lipid-lowering therapy: Atorvastatin 40 mg daily Previous lipid-lowering therapies (if applicable): none Documented or reported allergies or intolerances to lipid-lowering therapies (if applicable): none  Assessment:  LDL 52, at goal  Recommendation per protocol:  Continue current lipid-lowering therapy.  Plan:  Continue Atorvastatin 40 mg daily.  Dennie Fetters, RPh 07/11/2020, 5:50 PM

## 2020-07-11 NOTE — Progress Notes (Signed)
ANTICOAGULATION CONSULT NOTE - Follow Up Consult  Pharmacy Consult for Heparin Indication: at risk of LLE graft thrombosis  Allergies  Allergen Reactions  . Lisinopril Other (See Comments)    Syncope  . Codeine Rash    Patient Measurements: Height: 5\' 9"  (175.3 cm) Weight: 102.1 kg (225 lb) IBW/kg (Calculated) : 70.7 Heparin Dosing Weight: 93 kg  Vital Signs: Temp: 98.2 F (36.8 C) (09/10 1626) Temp Source: Oral (09/10 1626) BP: 114/65 (09/10 1626) Pulse Rate: 75 (09/10 1626)  Labs: Recent Labs    07/09/20 0833 07/09/20 1332 07/10/20 0049 07/10/20 0049 07/10/20 0528 07/10/20 0910 07/11/20 0240 07/11/20 1654  HGB 13.3   < > 12.1*   < > 12.9*  --  11.1*  --   HCT 39.0   < > 38.4*  --  40.3  --  33.4*  --   PLT  --    < > 251  --  269  --  253  --   HEPARINUNFRC  --    < > <0.10*   < > <0.10* <0.10*  --  0.35  CREATININE 1.10  --   --   --  1.34*  --  1.03  --    < > = values in this interval not displayed.    Estimated Creatinine Clearance: 87.6 mL/min (by C-G formula based on SCr of 1.03 mg/dL).  Assessment: 3 yom with known hx of L common femoral to PT bypass in 2019 requiring thrombolysis and mechanical thrombectomy in March 2021. Was discharged on apixaban but unable to afford so stopped taking.   S/p aortogram 9/8 with initiation of thrombolysis with tPA and IV heparin but no improvement. To OR 9/9 for thrombectomy and re-do PT bypass and graft. No incision site hematomas noted. To resume IV heparin without bolus due to at risk of graft thrombosis. TPA completed 9/9. Now on ASA 81 mg and Plavix.  Heparin level this evening is therapeutic at 0.35  Goal of Therapy:  Heparin level 0.3-0.7 units/ml Monitor platelets by anticoagulation protocol: Yes   Plan:  Continue heparin drip at 1500 units/hr 6hr heparin level to confirm Monitor daily heparin level and CBC, s/sx bleeding   11/9, PharmD, BCPS Please check AMION for all [facility] contact  numbers Clinical Pharmacist 07/11/2020 5:45 PM

## 2020-07-11 NOTE — Progress Notes (Signed)
Mobility Specialist: Progress Note    07/11/20 1357  Mobility  Activity Transferred:  Chair to bed  Level of Assistance Moderate assist, patient does 50-74%  Assistive Device Front wheel walker  Mobility Response Tolerated fair  Mobility performed by Mobility specialist  Bed Position Semi-fowlers  $Mobility charge 1 Mobility   Pre-Mobility: 85 HR, 96% SpO2 Post-Mobility: 84 HR, 143/73 BP, 98% SpO2  Pt c/o of 10/10 pain in his L groin incision during transfer. RN notified that pt wants pain medication. Pt is hopeful to try to walk tomorrow.   [facility] Rease Wence Mobility Specialist

## 2020-07-11 NOTE — Anesthesia Postprocedure Evaluation (Signed)
Anesthesia Post Note  Patient: Robert Sanchez  Procedure(s) Performed: REDO EXPOSURE OF LEFT COMMON FEMORAL ARTERY LEFT COMMON FEMORAL TO POSTERIOR TIBIAL COMPOSITE BYPASS GRAFT (Left Groin) THROMBECTOMY OF LEFT EXTERNAL ILIAC AND LEFT COMMON FEMORAL AND LEFT POSTERIOR TIBIAL ARTERIES (Left Groin)     Patient location during evaluation: PACU Anesthesia Type: General Level of consciousness: sedated Pain management: pain level controlled Vital Signs Assessment: post-procedure vital signs reviewed and stable Respiratory status: spontaneous breathing and respiratory function stable Cardiovascular status: stable Postop Assessment: no apparent nausea or vomiting Anesthetic complications: no   No complications documented.               Celestial Barnfield DANIEL

## 2020-07-11 NOTE — Evaluation (Addendum)
Occupational Therapy Evaluation Patient Details Name: Robert Sanchez MRN: [id number] DOB: [birth year] Today's Date: 07/11/2020    History of Present Illness 64 yo admitted with LLE ischemia s/p redo left fempop BPG with left iliac/femoral/tibial artery thrombectomy. PMHx: left fem pop BPG 08/21/18 with thrombectomy 12/2019, COPD, DM, neuropathy, HOH   Clinical Impression   Patient is s/p L LE fem pop BPG with L iliac/ femoral/ tibial artery thrombectomy surgery resulting in functional limitations due to the deficits listed below (see OT problem list). Pt requires min guard (A) with transfer and mod cues for safety due to leaving the RW. Pt is high fall risk due to his dislike of the RW. Patient will benefit from skilled OT acutely to increase independence and safety with ADLS to allow discharge HHOT.     Follow Up Recommendations  Home health OT    Equipment Recommendations  3 in 1 bedside commode    Recommendations for Other Services       Precautions / Restrictions Precautions Precautions: Fall Restrictions Weight Bearing Restrictions: No      Mobility Bed Mobility Overal bed mobility: Needs Assistance Bed Mobility: Supine to Sit     Supine to sit: Min guard     General bed mobility comments: pt rocking and pulling up on bed rail with increased effort to complete the task. pt straining himself to use rocking momentum  Transfers Overall transfer level: Needs assistance Equipment used: Rolling walker (2 wheeled) Transfers: Sit to/from Stand Sit to Stand: Min guard         General transfer comment: stopped using the RW    Balance Overall balance assessment: Mild deficits observed, not formally tested                                         ADL either performed or assessed with clinical judgement   ADL Overall ADL's : Needs assistance/impaired Eating/Feeding: Independent   Grooming: Supervision/safety;Sitting       Lower Body Bathing:  Moderate assistance       Lower Body Dressing: Moderate assistance   Toilet Transfer: Hydrographic surveyor Details (indicate cue type and reason): stand pivot unsafely pushing RW away. simulated OOB to chair         Functional mobility during ADLs: Supervision/safety;Rolling walker   discussed wound care with clean linen every shower and washing around the incision site to prevent infection     Vision Baseline Vision/History: No visual deficits       Perception     Praxis      Pertinent Vitals/Pain Pain Assessment: Faces Faces Pain Scale: Hurts even more Pain Location: LLE / peri area groin Pain Descriptors / Indicators: Aching;Burning Pain Intervention(s): Monitored during session;Premedicated before session;Repositioned     Hand Dominance Right   Extremity/Trunk Assessment Upper Extremity Assessment Upper Extremity Assessment: Overall WFL for tasks assessed   Lower Extremity Assessment Lower Extremity Assessment: Defer to PT evaluation LLE Deficits / Details: painful and noted to have small drop of drainage from incision after transfer   Cervical / Trunk Assessment Cervical / Trunk Assessment: Normal   Communication Communication Communication: HOH   Cognition Arousal/Alertness: Awake/alert Behavior During Therapy: WFL for tasks assessed/performed Overall Cognitive Status: Impaired/Different from baseline Area of Impairment: Memory                     Memory: Decreased short-term  memory         General Comments: pt disagreeable to OOB but when asked a second time initiates task   General Comments       Exercises Exercises: General Lower Extremity General Exercises - Lower Extremity Ankle Circles/Pumps: AROM;Left;10 reps;Seated   Shoulder Instructions      Home Living Family/patient expects to be discharged to:: Private residence Living Arrangements: Alone Available Help at Discharge: Friend(s);Available PRN/intermittently Type  of Home: House Home Access: Stairs to enter [employer] of Steps: 2   Home Layout: One level     Bathroom Shower/Tub: Chief Strategy Officer: Standard     Home Equipment: Environmental consultant - 2 wheels;Gilmer Mor - single point   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 pt wants to make it to 64 yo prior to retirement      Prior Functioning/Environment Level of Independence: Independent                 OT Problem List: Decreased strength;Decreased activity tolerance;Impaired balance (sitting and/or standing);Decreased safety awareness;Decreased knowledge of use of DME or AE;Decreased knowledge of precautions;Pain      OT Treatment/Interventions: Self-care/ADL training;Therapeutic exercise;DME and/or AE instruction;Therapeutic activities;Balance training;Patient/family education    OT Goals(Current goals can be found in the care plan section) Acute Rehab OT Goals Patient Stated Goal: to return home by monday OT Goal Formulation: With patient Time For Goal Achievement: 07/25/20 Potential to Achieve Goals: Good  OT Frequency: Min 2X/week   Barriers to D/C:            Co-evaluation              AM-PAC OT "6 Clicks" Daily Activity     Outcome Measure Help from another person eating meals?: None Help from another person taking care of personal grooming?: None Help from another person toileting, which includes using toliet, bedpan, or urinal?: A Little Help from another person bathing (including washing, rinsing, drying)?: A Little Help from another person to put on and taking off regular upper body clothing?: None Help from another person to put on and taking off regular lower body clothing?: A Lot 6 Click Score: 20   End of Session Nurse Communication: Mobility status;Precautions;Weight bearing status  Activity Tolerance: Patient tolerated treatment well Patient left: in chair;with call  bell/phone within reach  OT Visit Diagnosis: Unsteadiness on feet (R26.81);Muscle weakness (generalized) (M62.81);Pain                Time: 3818-2993 OT Time Calculation (min): 22 min Charges:  OT General Charges $OT Visit: 1 Visit OT Evaluation $OT Eval Moderate Complexity: 1 Mod   Brynn, OTR/L  Acute Rehabilitation Services Pager: [phone] Office: [phone] .   Mateo Flow 07/11/2020, 2:12 PM

## 2020-07-12 LAB — CBC
HCT: 29.6 % — ABNORMAL LOW (ref 39.0–52.0)
Hemoglobin: 9.7 g/dL — ABNORMAL LOW (ref 13.0–17.0)
MCH: 30 pg (ref 26.0–34.0)
MCHC: 32.8 g/dL (ref 30.0–36.0)
MCV: 91.6 fL (ref 80.0–100.0)
Platelets: 236 10*3/uL (ref 150–400)
RBC: 3.23 MIL/uL — ABNORMAL LOW (ref 4.22–5.81)
RDW: 14.1 % (ref 11.5–15.5)
WBC: 10.1 10*3/uL (ref 4.0–10.5)
nRBC: 0 % (ref 0.0–0.2)

## 2020-07-12 LAB — GLUCOSE, CAPILLARY
Glucose-Capillary: 132 mg/dL — ABNORMAL HIGH (ref 70–99)
Glucose-Capillary: 176 mg/dL — ABNORMAL HIGH (ref 70–99)
Glucose-Capillary: 157 mg/dL — ABNORMAL HIGH (ref 70–99)
Glucose-Capillary: 145 mg/dL — ABNORMAL HIGH (ref 70–99)

## 2020-07-12 LAB — PROTIME-INR
INR: 1.1 (ref 0.8–1.2)
Prothrombin Time: 13.6 seconds (ref 11.4–15.2)

## 2020-07-12 LAB — HEPARIN LEVEL (UNFRACTIONATED)
Heparin Unfractionated: 0.24 IU/mL — ABNORMAL LOW (ref 0.30–0.70)
Heparin Unfractionated: 0.21 IU/mL — ABNORMAL LOW (ref 0.30–0.70)
Heparin Unfractionated: <0.1 IU/mL — ABNORMAL LOW (ref 0.30–0.70)

## 2020-07-12 MED ORDER — OXYCODONE-ACETAMINOPHEN 5-325 MG PO TABS
1.0000 | ORAL_TABLET | Freq: Four times a day (QID) | ORAL | 0 refills | Status: DC | PRN
Start: 2020-07-12 — End: 2020-08-07

## 2020-07-12 MED ORDER — RIVAROXABAN (XARELTO) VTE STARTER PACK (15 & 20 MG): ORAL_TABLET | ORAL | 0 refills | Status: DC

## 2020-07-12 MED ORDER — ASPIRIN 81 MG PO TBEC: 81.0000 mg | DELAYED_RELEASE_TABLET | Freq: Every day | ORAL | 11 refills | Status: DC

## 2020-07-12 MED ORDER — WARFARIN - PHARMACIST DOSING INPATIENT: Freq: Every day | Status: DC

## 2020-07-12 MED ORDER — WARFARIN SODIUM 7.5 MG PO TABS
7.5000 mg | ORAL_TABLET | Freq: Once | ORAL | Status: AC
  Administered 2020-07-12: 7.5 mg via ORAL
  Filled 2020-07-12: qty 1

## 2020-07-12 MED ORDER — RIVAROXABAN 20 MG PO TABS: 20.0000 mg | ORAL_TABLET | Freq: Every day | ORAL | 11 refills | Status: DC

## 2020-07-12 NOTE — Progress Notes (Deleted)
ANTICOAGULATION CONSULT NOTE - Follow Up Consult  Pharmacy Consult for Heparin and warfarin Indication: at risk of LLE graft thrombosis  Allergies  Allergen Reactions  . Lisinopril Other (See Comments)    Syncope  . Codeine Rash    Patient Measurements: Height: 5\' 9"  (175.3 cm) Weight: 102.1 kg (225 lb) IBW/kg (Calculated) : 70.7 Heparin Dosing Weight: 93 kg  Vital Signs: Temp: 98.1 F (36.7 C) (09/11 0801) Temp Source: Oral (09/11 0801) BP: 137/63 (09/11 0801) Pulse Rate: 82 (09/11 0801)  Labs: Recent Labs    07/10/20 0528 07/10/20 0910 07/11/20 0240 07/11/20 1654 07/11/20 2313 07/12/20 0129 07/12/20 0317  HGB 12.9*   < > 11.1*  --   --  9.7*  --   HCT 40.3  --  33.4*  --   --  29.6*  --   PLT 269  --  253  --   --  236  --   HEPARINUNFRC <0.10*   < >  --  0.35 0.33  --  0.24*  CREATININE 1.34*  --  1.03  --   --   --   --    < > = values in this interval not displayed.    Estimated Creatinine Clearance: 87.6 mL/min (by C-G formula based on SCr of 1.03 mg/dL).  Assessment: 43 yom with known hx of L common femoral to PT bypass in 2019 requiring thrombolysis and mechanical thrombectomy in March 2021. Was discharged on apixaban but unable to afford so stopped taking.   S/p aortogram 9/8 with initiation of thrombolysis with tPA and IV heparin but no improvement. To OR 9/9 for thrombectomy and re-do PT bypass and graft. No incision site hematomas noted. To resume IV heparin without bolus due to at risk of graft thrombosis. TPA completed 9/9. Now on ASA 81 mg and Plavix.  Heparin level this evening is therapeutic at 0.33  Goal of Therapy:  Heparin level 0.3-0.7 units/ml Monitor platelets by anticoagulation protocol: Yes   Plan:  Continue heparin drip at 1500 units/hr Monitor daily heparin level and CBC, s/sx bleeding  11/9, PharmD Clinical Pharmacist **Pharmacist phone directory can now be found on [email or web address] ([email or web address]).  Listed under [facility]  Pharmacy.  Addendum -heparin level= 0.24  Plan -Increase heparin to 1650 units/hr -Heparin level in 6 hours and daily wth CBC daily  [facility], PharmD Clinical Pharmacist **Pharmacist phone directory can now be found on [email or web address] ([email or web address]).  Listed under [facility].

## 2020-07-12 NOTE — Care Management (Signed)
Discussed DC plan with patient and MD/PA.  Patient has been on Xarelto and Eliquis in the past and was non-compliant due to expense.  Patient states he was a patient at one time at [facility] but did not pursue because it was cheaper to go to [facility] and pay for meds at [facility].  Patient is not interested in pursuing at this time.  MD/PA decided to change medication to Coumadin and delay discharge.  RW ordered. Patient states he does not have a credit card to sign up for charity program with [employer].  He will purchase privately.

## 2020-07-12 NOTE — Progress Notes (Addendum)
ANTICOAGULATION CONSULT NOTE - Follow Up Consult  Pharmacy Consult for Heparin Indication: at risk of LLE graft thrombosis  Allergies  Allergen Reactions  . Lisinopril Other (See Comments)    Syncope  . Codeine Rash    Patient Measurements: Height: 5\' 9"  (175.3 cm) Weight: 102.1 kg (225 lb) IBW/kg (Calculated) : 70.7 Heparin Dosing Weight: 93 kg  Vital Signs: Temp: 99 F (37.2 C) (09/10 2052) Temp Source: Oral (09/10 2052) BP: 141/62 (09/10 2052) Pulse Rate: 79 (09/10 2052)  Labs: Recent Labs    07/09/20 09/08/20 07/09/20 1332 07/10/20 0049 07/10/20 0049 07/10/20 0528 07/10/20 0528 07/10/20 0910 07/11/20 0240 07/11/20 1654 07/11/20 2313  HGB 13.3   < > 12.1*   < > 12.9*  --   --  11.1*  --   --   HCT 39.0   < > 38.4*  --  40.3  --   --  33.4*  --   --   PLT  --    < > 251  --  269  --   --  253  --   --   HEPARINUNFRC  --    < > <0.10*   < > <0.10*   < > <0.10*  --  0.35 0.33  CREATININE 1.10  --   --   --  1.34*  --   --  1.03  --   --    < > = values in this interval not displayed.    Estimated Creatinine Clearance: 87.6 mL/min (by C-G formula based on SCr of 1.03 mg/dL).  Assessment: 19 yom with known hx of L common femoral to PT bypass in 2019 requiring thrombolysis and mechanical thrombectomy in March 2021. Was discharged on apixaban but unable to afford so stopped taking.   S/p aortogram 9/8 with initiation of thrombolysis with tPA and IV heparin but no improvement. To OR 9/9 for thrombectomy and re-do PT bypass and graft. No incision site hematomas noted. To resume IV heparin without bolus due to at risk of graft thrombosis. TPA completed 9/9. Now on ASA 81 mg and Plavix.  Heparin level this evening is therapeutic at 0.33  Goal of Therapy:  Heparin level 0.3-0.7 units/ml Monitor platelets by anticoagulation protocol: Yes   Plan:  Continue heparin drip at 1500 units/hr Monitor daily heparin level and CBC, s/sx bleeding  11/9, PharmD Clinical  Pharmacist **Pharmacist phone directory can now be found on [email or web address] ([email or web address]).  Listed under [facility].  Addendum -heparin level= 0.24  Plan -Increase heparin to 1650 units/hr -Heparin level in 6 hours and daily wth CBC daily  [facility], PharmD Clinical Pharmacist **Pharmacist phone directory can now be found on [email or web address] ([email or web address]).  Listed under [facility].

## 2020-07-12 NOTE — Discharge Instructions (Signed)
 [facility] [place]  Discharge instructions  Lower Extremity Bypass Surgery  Please refer to the following instruction for your post-procedure care. Your surgeon or physician assistant will discuss any changes with you.  Activity  You are encouraged to walk as much as you can. You can slowly return to normal activities during the month after your surgery. Avoid strenuous activity and heavy lifting until your doctor tells you it's OK. Avoid activities such as vacuuming or swinging a golf club. Do not drive until your doctor give the OK and you are no longer taking prescription pain medications. It is also normal to have difficulty with sleep habits, eating and bowel movement after surgery. These will go away with time.  Bathing/Showering  You may shower after you go home. Do not soak in a bathtub, hot tub, or swim until the incision heals completely.  Incision Care  Clean your incision with mild soap and water. Shower every day. Pat the area dry with a clean towel. You do not need a bandage unless otherwise instructed. Do not apply any ointments or creams to your incision. If you have open wounds you will be instructed how to care for them or a visiting nurse may be arranged for you. If you have staples or sutures along your incision they will be removed at your post-op appointment. You may have skin glue on your incision. Do not peel it off. It will come off on its own in about one week. If you have a great deal of moisture in your groin, use a gauze help keep this area dry.  Diet  Resume your normal diet. There are no special food restrictions following this procedure. A low fat/ low cholesterol diet is recommended for all patients with vascular disease. In order to heal from your surgery, it is CRITICAL to get adequate nutrition. Your body requires vitamins, minerals, and protein. Vegetables are the best source of vitamins and minerals. Vegetables also provide the  perfect balance of protein. Processed food has little nutritional value, so try to avoid this.  Medications  Resume taking all your medications unless your doctor or nurse practitioner tells you not to. If your incision is causing pain, you may take over-the-counter pain relievers such as acetaminophen (Tylenol). If you were prescribed a stronger pain medication, please aware these medication can cause nausea and constipation. Prevent nausea by taking the medication with a snack or meal. Avoid constipation by drinking plenty of fluids and eating foods with high amount of fiber, such as fruits, vegetables, and grains. Take Colase 100 mg (an over-the-counter stool softener) twice a day as needed for constipation. Do not take Tylenol if you are taking prescription pain medications.  Follow Up  Our office will schedule a follow up appointment 2-3 weeks following discharge.  Please call us immediately for any of the following conditions  Severe or worsening pain in your legs or feet while at rest or while walking Increase pain, redness, warmth, or drainage (pus) from your incision site(s) Fever of 101 degree or higher The swelling in your leg with the bypass suddenly worsens and becomes more painful than when you were in the hospital If you have been instructed to feel your graft pulse then you should do so every day. If you can no longer feel this pulse, call the office immediately. Not all patients are given this instruction.  Leg swelling is common after leg bypass surgery.  The swelling should improve over a few months   following surgery. To improve the swelling, you may elevate your legs above the level of your heart while you are sitting or resting. Your surgeon or physician assistant may ask you to apply an ACE wrap or wear compression (TED) stockings to help to reduce swelling.  Reduce your risk of vascular disease  Stop smoking. If you would like help call [facility] at 1-800-QUIT-NOW  (1-800-784-8669) or [place] at 336-586-4000.  Manage your cholesterol Maintain a desired weight Control your diabetes weight Control your diabetes Keep your blood pressure down  If you have any questions, please call the office at [phone]   

## 2020-07-12 NOTE — Progress Notes (Signed)
Mobility Specialist: Progress Note   07/12/20 1117  Mobility  Activity Ambulated in hall  Level of Assistance Minimal assist, patient does 75% or more  Assistive Device Front wheel walker  Distance Ambulated (ft) 240 ft  Mobility Response Tolerated well  Mobility performed by Mobility specialist  $Mobility charge 1 Mobility    Pre-Mobility:   Supine: 78 HR, 132/115 BP, 97% SpO2  Sitting: 129/63 BP Post-Mobility: 74 HR, 128/58 BP, 96% SpO2  Pt c/o of 10/10 pain in his L groin pre-mobility. Pt said he felt his pain easing up as we continued walking. Pt is motivated to continue ambulating.   [facility] [place] Mobility Specialist

## 2020-07-12 NOTE — Progress Notes (Signed)
ANTICOAGULATION CONSULT NOTE - Follow Up Consult  Pharmacy Consult for new warfarin with heparin bridge Indication: at risk of LLE graft thrombosis  Allergies  Allergen Reactions  . Lisinopril Other (See Comments)    Syncope  . Codeine Rash    Patient Measurements: Height: 5\' 9"  (175.3 cm) Weight: 102.1 kg (225 lb) IBW/kg (Calculated) : 70.7 Heparin Dosing Weight: 93 kg  Vital Signs: Temp: 98.7 F (37.1 C) (09/11 2027) Temp Source: Oral (09/11 2027) BP: 126/49 (09/11 2027) Pulse Rate: 74 (09/11 2027)  Labs: Recent Labs    07/10/20 0528 07/10/20 0910 07/11/20 0240 07/11/20 1654 07/11/20 2313 07/12/20 0129 07/12/20 0317 07/12/20 1215 07/12/20 1956  HGB 12.9*   < > 11.1*  --   --  9.7*  --   --   --   HCT 40.3  --  33.4*  --   --  29.6*  --   --   --   PLT 269  --  253  --   --  236  --   --   --   LABPROT  --   --   --   --   --   --   --  13.6  --   INR  --   --   --   --   --   --   --  1.1  --   HEPARINUNFRC <0.10*   < >  --    < >   < >  --  0.24* 0.21* <0.10*  CREATININE 1.34*  --  1.03  --   --   --   --   --   --    < > = values in this interval not displayed.    Estimated Creatinine Clearance: 87.6 mL/min (by C-G formula based on SCr of 1.03 mg/dL).  Assessment: 5 yom with known hx of L common femoral to PT bypass in 2019 requiring thrombolysis and mechanical thrombectomy in March 2021. Was discharged on apixaban but unable to afford so stopped taking.   S/p aortogram 9/8 with initiation of thrombolysis with tPA and IV heparin but no improvement. OR 9/9 for thrombectomy and re-do PT bypass and graft. No incision site hematomas noted. TPA completed 9/9. Heparin drip was started for risk of graft thrombosis.   Warfarin load started tonight, heparin level repeat undetectable. Confirmed with RN that heparin has not been infusing, now restarted at 1800 units/h.  Goal of Therapy:  Heparin level 0.3-0.7 units/ml Monitor platelets by anticoagulation protocol:  Yes   Plan:  -Restart heparin 1800 units/h -Recheck heparin level with am labs   11/9, PharmD, BCPS Clinical Pharmacist [phone] Please check AMION for all [facility] Pharmacy numbers 07/12/2020

## 2020-07-12 NOTE — Progress Notes (Addendum)
[facility] [place]  Subjective  - Doing well ready to go home.   Objective 137/63 82 98.1 F (36.7 C) (Oral) 15 96%  Intake/Output Summary (Last 24 hours) at 07/12/2020 0834 Last data filed at 07/12/2020 0810 Gross per 24 hour  Intake 633.65 ml  Output 500 ml  Net 133.65 ml    Doppler PT with weak AT Incisions healing well, groin soft Heart RRR Lungs non labored breathing  Assessment/Planning: POD # 2 redo exposure of left common femoral artery left PT, EIA and CFA thrombectomy; redo left common femoral to PT artery bypass with PTFE graft.     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 Ambulating, tol PO, and voided P Will order rolling walker for home.  He is declining HH.  Mosetta Pigeon 07/12/2020 8:34 AM --  Laboratory Lab Results: Recent Labs    07/11/20 0240 07/12/20 0129  WBC 10.2 10.1  HGB 11.1* 9.7*  HCT 33.4* 29.6*  PLT 253 236   BMET Recent Labs    07/10/20 0528 07/11/20 0240  NA 136 135  K 4.7 4.2  CL 105 105  CO2 23 20*  GLUCOSE 114* 129*  BUN 19 15  CREATININE 1.34* 1.03  CALCIUM 8.6* 8.3*    COAG Lab Results  Component Value Date   INR 1.2 01/16/2020   INR 1.19 08/17/2018   INR 1.45 06/12/2018   No results found for: PTT   I have seen and evaluated the patient. I agree with the PA note as documented above.  Status post left common femoral and iliac thrombectomy with composite left common femoral to PT bypass.  He has very good signal in the left foot at d the PT and in the bypass.  All of his incisions look good including the groin.  He wants to go home but trying to figure out anticoagulation plan.  Given he cannot afford Xarelto or Eliquis will plan to start Coumadin today and appreciate pharmacy input.  Continue heparin drip in the interim.  High risk bypass.  Cephus Shelling, MD [facility]  [place] Office: [phone]

## 2020-07-12 NOTE — Progress Notes (Addendum)
ANTICOAGULATION CONSULT NOTE - Follow Up Consult  Pharmacy Consult for new warfarin with heparin bridge Indication: at risk of LLE graft thrombosis  Allergies  Allergen Reactions  . Lisinopril Other (See Comments)    Syncope  . Codeine Rash    Patient Measurements: Height: 5\' 9"  (175.3 cm) Weight: 102.1 kg (225 lb) IBW/kg (Calculated) : 70.7 Heparin Dosing Weight: 93 kg  Vital Signs: Temp: 98.1 F (36.7 C) (09/11 0801) Temp Source: Oral (09/11 0801) BP: 137/63 (09/11 0801) Pulse Rate: 82 (09/11 0801)  Labs: Recent Labs    07/10/20 0528 07/10/20 0910 07/11/20 0240 07/11/20 1654 07/11/20 2313 07/12/20 0129 07/12/20 0317  HGB 12.9*   < > 11.1*  --   --  9.7*  --   HCT 40.3  --  33.4*  --   --  29.6*  --   PLT 269  --  253  --   --  236  --   HEPARINUNFRC <0.10*   < >  --  0.35 0.33  --  0.24*  CREATININE 1.34*  --  1.03  --   --   --   --    < > = values in this interval not displayed.    Estimated Creatinine Clearance: 87.6 mL/min (by C-G formula based on SCr of 1.03 mg/dL).  Assessment: 38 yom with known hx of L common femoral to PT bypass in 2019 requiring thrombolysis and mechanical thrombectomy in March 2021. Was discharged on apixaban but unable to afford so stopped taking.   S/p aortogram 9/8 with initiation of thrombolysis with tPA and IV heparin but no improvement. OR 9/9 for thrombectomy and re-do PT bypass and graft. No incision site hematomas noted. TPA completed 9/9. Heparin drip was started for risk of graft thrombosis.   Heparin level overnight was 0.24 and rate was increased to 1,650 units/hr.  6-hr HL is 0.21.   Now [facility] would like to begin warfarin (xarelto planned originally but r/o d/t cost).  Hgb has dropped this admission, now 9.7. PLT WNL. 100% of meals charted. Of note he is also taking aspirin and clopidogrel.   Will cautiously increase heparin and initiate warfarin.  Goal of Therapy:  Heparin level 0.3-0.7 units/ml Monitor platelets by  anticoagulation protocol: Yes   Plan:  Increase heparin drip to 1,800 units/hr Follow up with 6-hr heparin level Start warfarin 7.5mg  PO x1 tonight Monitor CBC and s/sx bleeding closely  11/9, PharmD PGY1 Acute Care Pharmacy Resident Please refer to [facility] for unit-specific pharmacist

## 2020-07-13 LAB — PROTIME-INR
INR: 1.2 (ref 0.8–1.2)
Prothrombin Time: 14.2 seconds (ref 11.4–15.2)

## 2020-07-13 LAB — CBC
HCT: 29.5 % — ABNORMAL LOW (ref 39.0–52.0)
Hemoglobin: 9.5 g/dL — ABNORMAL LOW (ref 13.0–17.0)
MCH: 29.3 pg (ref 26.0–34.0)
MCHC: 32.2 g/dL (ref 30.0–36.0)
MCV: 91 fL (ref 80.0–100.0)
Platelets: 249 10*3/uL (ref 150–400)
RBC: 3.24 MIL/uL — ABNORMAL LOW (ref 4.22–5.81)
RDW: 13.9 % (ref 11.5–15.5)
WBC: 9.3 10*3/uL (ref 4.0–10.5)
nRBC: 0 % (ref 0.0–0.2)

## 2020-07-13 LAB — GLUCOSE, CAPILLARY
Glucose-Capillary: 115 mg/dL — ABNORMAL HIGH (ref 70–99)
Glucose-Capillary: 138 mg/dL — ABNORMAL HIGH (ref 70–99)
Glucose-Capillary: 168 mg/dL — ABNORMAL HIGH (ref 70–99)
Glucose-Capillary: 169 mg/dL — ABNORMAL HIGH (ref 70–99)

## 2020-07-13 LAB — HEPARIN LEVEL (UNFRACTIONATED): Heparin Unfractionated: 0.29 IU/mL — ABNORMAL LOW (ref 0.30–0.70)

## 2020-07-13 MED ORDER — WARFARIN SODIUM 7.5 MG PO TABS
7.5000 mg | ORAL_TABLET | Freq: Once | ORAL | Status: AC
  Administered 2020-07-13: 7.5 mg via ORAL
  Filled 2020-07-13: qty 1

## 2020-07-13 NOTE — Progress Notes (Signed)
ANTICOAGULATION CONSULT NOTE - Follow Up Consult  Pharmacy Consult for new warfarin with heparin bridge Indication: at risk of LLE graft thrombosis  Allergies  Allergen Reactions  . Lisinopril Other (See Comments)    Syncope  . Codeine Rash    Patient Measurements: Height: 5\' 9"  (175.3 cm) Weight: 102.1 kg (225 lb) IBW/kg (Calculated) : 70.7 Heparin Dosing Weight: 93 kg  Vital Signs: Temp: 98 F (36.7 C) (09/12 0501) Temp Source: Oral (09/12 0501) BP: 120/66 (09/12 0501) Pulse Rate: 69 (09/12 0501)  Labs: Recent Labs    07/11/20 0240 07/11/20 1654 07/12/20 0129 07/12/20 0317 07/12/20 1215 07/12/20 1956 07/13/20 0312  HGB 11.1*  --  9.7*  --   --   --  9.5*  HCT 33.4*  --  29.6*  --   --   --  29.5*  PLT 253  --  236  --   --   --  249  LABPROT  --   --   --   --  13.6  --  14.2  INR  --   --   --   --  1.1  --  1.2  HEPARINUNFRC  --    < >  --    < > 0.21* <0.10* 0.29*  CREATININE 1.03  --   --   --   --   --   --    < > = values in this interval not displayed.    Estimated Creatinine Clearance: 87.6 mL/min (by C-G formula based on SCr of 1.03 mg/dL).  Assessment: 9 yom with known hx of L common femoral to PT bypass in 2019 requiring thrombolysis and mechanical thrombectomy in March 2021. Was discharged on apixaban but unable to afford so stopped taking.   S/p aortogram 9/8 with initiation of thrombolysis with tPA and IV heparin but no improvement. OR 9/9 for thrombectomy and re-do PT bypass and graft. No incision site hematomas noted. TPA completed 9/9. Heparin drip was started for risk of graft thrombosis.   Heparin drip had been off yesterday afternoon (unknown reason) but was restarted around 2000, ~7hr HL is 0.29. INR is 1.2. Hgb continues to fall, down to 9.5 today. PLT WNL. No s/sx bleeding.  Goal of Therapy:  Heparin level 0.3-0.7 units/ml Monitor platelets by anticoagulation protocol: Yes   Plan:  -Continue heparin 1800 units/h -Recheck heparin  level with am labs -Continue warfarin 7.5mg  PO x1 tonight -Monitor CBC and s/sx bleeding  2001, PharmD PGY1 Acute Care Pharmacy Resident Please refer to [facility] for unit-specific pharmacist

## 2020-07-13 NOTE — Progress Notes (Signed)
Mobility Specialist: Progress Note    07/13/20 1335  Mobility  Activity Ambulated in hall  Level of Assistance Modified independent, requires aide device or extra time  Assistive Device Front wheel walker  Distance Ambulated (ft) 420 ft  Mobility Response Tolerated well  Mobility performed by Mobility specialist  $Mobility charge 1 Mobility   Pre-Mobility: 73 HR, 114/48 BP, 92% SpO2 Post-Mobility: 78 HR, 143/58 BP, 96% SpO2  Pt tolerated ambulation well. Pt said he felt some tightness in his lower back and LE during ambulation but otherwise had no c/o.   [facility] [place] Mobility Specialist

## 2020-07-13 NOTE — Progress Notes (Signed)
Physical Therapy Treatment Patient Details Name: Robert Sanchez MRN: [id number] DOB: 1956-02-05 Today's Date: 07/13/2020    History of Present Illness 64 yo admitted with LLE ischemia s/p redo left fempop BPG with left iliac/femoral/tibial artery thrombectomy. PMHx: left fem pop BPG 08/21/18 with thrombectomy 12/2019, COPD, DM, neuropathy, HOH    PT Comments    Progressing well with mobility. Min guard assist transfers and ambulation 225' with RW. Cues for posture and to stay close to RW. Pt reporting LLE pain as mild stiffness/soreness. PT recommending HHPT but pt declining.    Follow Up Recommendations  Home health PT;Supervision - Intermittent     Equipment Recommendations  Rolling walker with 5" wheels    Recommendations for Other Services       Precautions / Restrictions Precautions Precautions: Fall Restrictions Weight Bearing Restrictions: No    Mobility  Bed Mobility Overal bed mobility: Needs Assistance Bed Mobility: Supine to Sit     Supine to sit: Supervision;HOB elevated     General bed mobility comments: +rail, supervision for safety/lines  Transfers Overall transfer level: Needs assistance Equipment used: Rolling walker (2 wheeled) Transfers: Sit to/from Stand Sit to Stand: Min guard         General transfer comment: increased time, min guard for safety  Ambulation/Gait Ambulation/Gait assistance: Min guard Gait Distance (Feet): 225 Feet Assistive device: Rolling walker (2 wheeled) Gait Pattern/deviations: Step-through pattern;Decreased stride length;Decreased weight shift to left;Decreased stance time - left;Antalgic Gait velocity: decreased Gait velocity interpretation: 1.31 - 2.62 ft/sec, indicative of limited community ambulator General Gait Details: steady gait with RW   Stairs             Wheelchair Mobility    Modified Rankin (Stroke Patients Only)       Balance Overall balance assessment: Mild deficits observed, not  formally tested                                          Cognition Arousal/Alertness: Awake/alert Behavior During Therapy: WFL for tasks assessed/performed Overall Cognitive Status: Within Functional Limits for tasks assessed                                        Exercises      General Comments        Pertinent Vitals/Pain Pain Assessment: Faces Faces Pain Scale: Hurts a little bit Pain Location: LLE Pain Descriptors / Indicators: Discomfort;Sore Pain Intervention(s): Monitored during session;Repositioned    Home Living                      Prior Function            PT Goals (current goals can now be found in the care plan section) Acute Rehab PT Goals Patient Stated Goal: home Progress towards PT goals: Progressing toward goals    Frequency    Min 3X/week      PT Plan Equipment recommendations need to be updated    Co-evaluation              AM-PAC PT "6 Clicks" Mobility   Outcome Measure  Help needed turning from your back to your side while in a flat bed without using bedrails?: None Help needed moving from lying on your back to sitting on the  side of a flat bed without using bedrails?: A Little Help needed moving to and from a bed to a chair (including a wheelchair)?: A Little Help needed standing up from a chair using your arms (e.g., wheelchair or bedside chair)?: A Little Help needed to walk in hospital room?: A Little Help needed climbing 3-5 steps with a railing? : A Lot 6 Click Score: 18    End of Session Equipment Utilized During Treatment: Gait belt Activity Tolerance: Patient tolerated treatment well Patient left: in chair;with call bell/phone within reach (at sink to wash up, NT notified) Nurse Communication: Mobility status PT Visit Diagnosis: Other abnormalities of gait and mobility (R26.89);Muscle weakness (generalized) (M62.81);Difficulty in walking, not elsewhere classified (R26.2)      Time: 8295-6213 PT Time Calculation (min) (ACUTE ONLY): 24 min  Charges:  $Gait Training: 23-37 mins                     Aida Raider, [place]  Office # [phone] Pager [phone]    Ilda Foil 07/13/2020, 9:38 AM

## 2020-07-13 NOTE — Plan of Care (Signed)
Poc progressing.  

## 2020-07-13 NOTE — Progress Notes (Addendum)
[facility] [place]  Subjective  - Doing well no new complaints   Objective 120/66 69 98 F (36.7 C) (Oral) 20 99%  Intake/Output Summary (Last 24 hours) at 07/13/2020 0804 Last data filed at 07/13/2020 0600 Gross per 24 hour  Intake 1020 ml  Output 1675 ml  Net -655 ml    Doppler PT brisk with weaker AT/DP left LE signals Groin soft without hematoma, leg incision healing well Lungs non labored breathing  Assessment/Planning: POD # 3  left LE re-do fem to PT bypass   He was having finacial trouble with affording xarelto or Eliquis for anticoagulation.  We started him on Coumadin yesterday and he will stay an in patient until his INR is above 2.  INR 1.2 today.  He is ambulating and pain is better controlled.  Mosetta Pigeon 07/13/2020 8:04 AM --  Laboratory Lab Results: Recent Labs    07/12/20 0129 07/13/20 0312  WBC 10.1 9.3  HGB 9.7* 9.5*  HCT 29.6* 29.5*  PLT 236 249   BMET Recent Labs    07/11/20 0240  NA 135  K 4.2  CL 105  CO2 20*  GLUCOSE 129*  BUN 15  CREATININE 1.03  CALCIUM 8.3*    COAG Lab Results  Component Value Date   INR 1.2 07/13/2020   INR 1.1 07/12/2020   INR 1.2 01/16/2020   No results found for: PTT  I have seen and evaluated the patient. I agree with the PA note as documented above. Continues to have good Doppler flow in the left PT with good signal in the bypass. Status post redo left common femoral to PT bypass with composite and left common femoral iliac thrombectomy after attempt at lysis. High risk bypass and really cannot afford Xarelto or Eliquis so we are starting him on Coumadin. INR 1.2 today. We will continue to monitor INR with heparin bridge in the interim. Home health PT for therapy.  Cephus Shelling, MD [facility] [place] Office: [phone]

## 2020-07-14 ENCOUNTER — Other Ambulatory Visit: Payer: Self-pay

## 2020-07-14 DIAGNOSIS — I25810 Atherosclerosis of coronary artery bypass graft(s) without angina pectoris: Secondary | ICD-10-CM

## 2020-07-14 LAB — CBC
HCT: 31.6 % — ABNORMAL LOW (ref 39.0–52.0)
Hemoglobin: 10 g/dL — ABNORMAL LOW (ref 13.0–17.0)
MCH: 29.9 pg (ref 26.0–34.0)
MCHC: 31.6 g/dL (ref 30.0–36.0)
MCV: 94.6 fL (ref 80.0–100.0)
Platelets: 292 10*3/uL (ref 150–400)
RBC: 3.34 MIL/uL — ABNORMAL LOW (ref 4.22–5.81)
RDW: 14.1 % (ref 11.5–15.5)
WBC: 9.7 10*3/uL (ref 4.0–10.5)
nRBC: 0 % (ref 0.0–0.2)

## 2020-07-14 LAB — HEPARIN LEVEL (UNFRACTIONATED): Heparin Unfractionated: 0.3 IU/mL (ref 0.30–0.70)

## 2020-07-14 LAB — PROTIME-INR
INR: 1.9 — ABNORMAL HIGH (ref 0.8–1.2)
Prothrombin Time: 20.9 seconds — ABNORMAL HIGH (ref 11.4–15.2)

## 2020-07-14 LAB — GLUCOSE, CAPILLARY: Glucose-Capillary: 122 mg/dL — ABNORMAL HIGH (ref 70–99)

## 2020-07-14 MED ORDER — WARFARIN SODIUM 5 MG PO TABS: 5.0000 mg | ORAL_TABLET | Freq: Every day | ORAL | 11 refills | Status: DC

## 2020-07-14 NOTE — Progress Notes (Addendum)
[facility]  Subjective  - doing well.   Objective (!) 123/59 64 98.3 F (36.8 C) (Oral) 12 96%  Intake/Output Summary (Last 24 hours) at 07/14/2020 0708 Last data filed at 07/14/2020 0352 Gross per 24 hour  Intake 760 ml  Output 2025 ml  Net -1265 ml   Doppler PT/DP B LE Left GT wound stable and dry Groin soft without hematoma Lungs non labored breathing   Assessment/Planning: POD # 4    left LE re-do fem to PT bypass   He was having finacial trouble with affording xarelto or Eliquis for anticoagulation.  He was started on coumadin 2 days ago.  INR 1.9 today.  We will plan to send him home with 5 mg dose daily and a f/u with out patient Coumadin clinic will be arranged.  He is ambulating in the halls and states his left LE feels better.   Plan to discharge home today in stable condition.   Mosetta Pigeon 07/14/2020 7:08 AM --  Laboratory Lab Results: Recent Labs    07/13/20 0312 07/14/20 0139  WBC 9.3 9.7  HGB 9.5* 10.0*  HCT 29.5* 31.6*  PLT 249 292   BMET No results for input(s): NA, K, CL, CO2, GLUCOSE, BUN, CREATININE, CALCIUM in the last 72 hours.  COAG Lab Results  Component Value Date   INR 1.9 (H) 07/14/2020   INR 1.2 07/13/2020   INR 1.1 07/12/2020   No results found for: PTT  I have seen and evaluated the patient. I agree with the PA note as documented above.  64 year old male status post attempt at thrombolysis of an occluded left fem PT bypass.  Ultimately this failed and we had taken to the OR for thrombectomy of his femoral/iliac artery and a redo left common femoral to PT composite bypass.  Very brisk signals.  He cannot afford Eliquis or Xarelto so we started him on Coumadin for high risk bypass failure.  INR is 1.9 today.  We will plan discharge on Coumadin and aspirin.  He has been referred to the Coumadin clinic.  I will have him follow-up with me in 1 month with ABIs and arterial duplex of his left  leg.  Brisk left PT signal and signal in the bypass.  Cephus Shelling, MD [facility] Office: [phone]

## 2020-07-14 NOTE — Plan of Care (Signed)
  Problem: Education: Goal: Knowledge of prescribed regimen will improve Outcome: Progressing   Problem: Activity: Goal: Ability to tolerate increased activity will improve Outcome: Progressing   Problem: Bowel/Gastric: Goal: Gastrointestinal status for postoperative course will improve Outcome: Progressing   Problem: Clinical Measurements: Goal: Postoperative complications will be avoided or minimized Outcome: Progressing Goal: Signs and symptoms of graft occlusion will improve Outcome: Progressing   Problem: Skin Integrity: Goal: Demonstration of wound healing without infection will improve Outcome: Progressing   

## 2020-07-14 NOTE — Plan of Care (Signed)
  Problem: Education: Goal: Knowledge of prescribed regimen will improve Outcome: Adequate for Discharge   Problem: Activity: Goal: Ability to tolerate increased activity will improve Outcome: Adequate for Discharge   Problem: Bowel/Gastric: Goal: Gastrointestinal status for postoperative course will improve Outcome: Adequate for Discharge   Problem: Clinical Measurements: Goal: Postoperative complications will be avoided or minimized Outcome: Adequate for Discharge Goal: Signs and symptoms of graft occlusion will improve Outcome: Adequate for Discharge   Problem: Skin Integrity: Goal: Demonstration of wound healing without infection will improve Outcome: Adequate for Discharge   Problem: Acute Rehab PT Goals(only PT should resolve) Goal: Pt Will Go Supine/Side To Sit Outcome: Adequate for Discharge Goal: Patient Will Transfer Sit To/From Stand Outcome: Adequate for Discharge Goal: Pt Will Transfer Bed To Chair/Chair To Bed Outcome: Adequate for Discharge Goal: Pt Will Ambulate Outcome: Adequate for Discharge Goal: Pt Will Go Up/Down Stairs Outcome: Adequate for Discharge Goal: Pt/caregiver will Perform Home Exercise Program Outcome: Adequate for Discharge   Problem: Acute Rehab OT Goals (only OT should resolve) Goal: Pt. Will Perform Grooming Outcome: Adequate for Discharge Goal: Pt. Will Perform Lower Body Dressing Outcome: Adequate for Discharge Goal: Pt. Will Transfer To Toilet Outcome: Adequate for Discharge Goal: OT Additional ADL Goal #1 Outcome: Adequate for Discharge

## 2020-07-14 NOTE — Progress Notes (Signed)
Pt provided d/c instructions and education. IV removed and intact. CCMD notified and tele removed. PT vitals stable. Pt denies complaints. Pt has all belongings including prescription. Valet to tx via wheelchair to [employer] to meet ride. Lacy Duverney, RN

## 2020-07-16 ENCOUNTER — Other Ambulatory Visit: Payer: Self-pay

## 2020-07-16 ENCOUNTER — Ambulatory Visit (INDEPENDENT_AMBULATORY_CARE_PROVIDER_SITE_OTHER): Payer: Self-pay

## 2020-07-16 DIAGNOSIS — I824Y9 Acute embolism and thrombosis of unspecified deep veins of unspecified proximal lower extremity: Secondary | ICD-10-CM | POA: Insufficient documentation

## 2020-07-16 DIAGNOSIS — I25810 Atherosclerosis of coronary artery bypass graft(s) without angina pectoris: Secondary | ICD-10-CM

## 2020-07-16 DIAGNOSIS — Z7901 Long term (current) use of anticoagulants: Secondary | ICD-10-CM | POA: Insufficient documentation

## 2020-07-16 LAB — POCT INR: INR: 5.8 — AB (ref 2.0–3.0)

## 2020-07-16 NOTE — Patient Instructions (Signed)
Hold 3 days Wednesday, Thursday and Friday and then continue 0.5 tablet daily except 1 tablet Sunday and Saturday.  Recheck INR Monday 9/20  A full discussion of the nature of anticoagulants has been carried out.  A benefit risk analysis has been presented to the patient, so that they understand the justification for choosing anticoagulation at this time. The need for frequent and regular monitoring, precise dosage adjustment and compliance is stressed.  Side effects of potential bleeding are discussed.  The patient should avoid any OTC items containing aspirin or ibuprofen, and should avoid great swings in general diet.  Avoid alcohol consumption.  Call if any signs of abnormal bleeding.  [phone]

## 2020-07-16 NOTE — Discharge Summary (Signed)
[facility] Discharge Summary   Patient ID:  Robert Sanchez MRN: [id number] DOB/AGE: [birth year] 64 y.o.  Admit date: 07/09/2020 Discharge date: 07/14/20 Date of Surgery: 07/10/2020 Surgeon: Surgeon(s): Cephus Shelling, MD Maeola Harman, MD  Admission Diagnosis: PAD (peripheral artery disease) [facility]) [I73.9]  Discharge Diagnoses:  PAD (peripheral artery disease) (HCC) [I73.9]  Secondary Diagnoses: Past Medical History:  Diagnosis Date  . Acute pulmonary edema (HCC) 06/05/2018  . Arthritis   . COPD (chronic obstructive pulmonary disease) (HCC)    " beginning stages " per pt  . Coronary artery disease   . Diabetes mellitus without complication (HCC)   . Diabetic neuropathy (HCC)   . Diabetic neuropathy (HCC)   . Emphysema/COPD (HCC)    " beginning stages"  . GERD (gastroesophageal reflux disease)   . HOH (hard of hearing)   . Myocardial infarction (HCC)   . 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 Ultimately he presented to the hospital earlier this year with an occluded bypass and underwent thrombolysis and subsequent percutaneous mechanical thrombectomy with angioplasty of the bypass and PT on 01/16/20- 01/17/2020. Ultimately his bypass was salvaged and he was discharged on Eliquis. During this event he did develop necrotic left great toe that we have been watching. He has wanted to avoid amputation and we have been closely following this wound.  His bypass was discovered to be occluded on 07/09/20.  He was scheduled for re-do bypass.     Hospital Course:  Robert Sanchez is a 64 y.o. male is S/P  Procedure(s): REDO EXPOSURE OF LEFT COMMON FEMORAL ARTERY LEFT COMMON FEMORAL TO POSTERIOR TIBIAL COMPOSITE BYPASS GRAFT THROMBECTOMY OF LEFT EXTERNAL ILIAC AND LEFT COMMON FEMORAL AND LEFT POSTERIOR TIBIAL ARTERIES   Post op he had a patent bypass with good doppler signals in the bypass distally and PT.  He was placed on Heparin to prevent re occlusion.  He states he can't afford to take eliquis or xarelto.  He was started on Coumadin with an INR goal of 2.0-3.0.  He had an INR of 1.9 at discharge on 5 mg dose and f/u INR checks with the Coumadin clinic.    Patent bypass flow and stable for discharge.  Significant Diagnostic Studies: CBC Lab Results  Component Value Date   WBC 9.7 07/14/2020   HGB 10.0 (L) 07/14/2020   HCT 31.6 (L) 07/14/2020   MCV 94.6 07/14/2020   PLT 292 07/14/2020    BMET    Component Value Date/Time   NA 135 07/11/2020 0240   NA 137 06/27/2020 1216   K 4.2 07/11/2020 0240   CL 105 07/11/2020 0240   CO2 20 (L) 07/11/2020 0240   GLUCOSE 129 (H) 07/11/2020 0240   BUN 15 07/11/2020 0240   BUN 17 06/27/2020 1216   CREATININE 1.03 07/11/2020 0240   CREATININE 0.81 08/14/2014 1126   CALCIUM 8.3 (L) 07/11/2020 0240   GFRNONAA >60 07/11/2020 0240   GFRNONAA >89 08/14/2014 1126   GFRAA >60 07/11/2020 0240   GFRAA >89 08/14/2014 1126   COAG Lab Results  Component Value Date   INR 1.9 (H) 07/14/2020  INR 1.2 07/13/2020   INR 1.1 07/12/2020     Disposition:  Discharge to :Home Discharge Instructions    Call MD for:  redness, tenderness, or signs of infection (pain, swelling, bleeding, redness, odor or green/yellow discharge around incision site)   Complete by: As directed    Call MD for:  redness, tenderness, or signs of infection (pain, swelling, bleeding, redness, odor or green/yellow discharge around incision site)   Complete by: As directed    Call MD for:  severe or increased pain, loss or decreased feeling  in  affected limb(s)   Complete by: As directed    Call MD for:  severe or increased pain, loss or decreased feeling  in affected limb(s)   Complete by: As directed    Call MD for:  temperature >100.5   Complete by: As directed    Call MD for:  temperature >100.5   Complete by: As directed    Resume previous diet   Complete by: As directed    Resume previous diet   Complete by: As directed      Allergies as of 07/14/2020      Reactions   Lisinopril Other (See Comments)   Syncope   Codeine Rash      Medication List    STOP taking these medications   apixaban 5 MG Tabs tablet Commonly known as: ELIQUIS   clopidogrel 75 MG tablet Commonly known as: PLAVIX     TAKE these medications   aspirin 81 MG EC tablet Take 1 tablet (81 mg total) by mouth daily at 6 (six) AM. Swallow whole. Notes to patient: Take tomorrow   atorvastatin 40 MG tablet Commonly known as: LIPITOR Take 1 tablet (40 mg total) by mouth at bedtime. Notes to patient: Take @ bedtime   carvedilol 6.25 MG tablet Commonly known as: COREG Take 1 tablet (6.25 mg total) by mouth 2 (two) times daily with a meal. Notes to patient: Take this afternoon   furosemide 40 MG tablet Commonly known as: LASIX Take 1 tablet (40 mg total) by mouth daily.   glipiZIDE 10 MG tablet Commonly known as: GLUCOTROL Take 1 tablet (10 mg total) by mouth 2 (two) times daily before a meal. Notes to patient: Take this evening   metFORMIN 1000 MG tablet Commonly known as: GLUCOPHAGE Take 1 tablet (1,000 mg total) by mouth 2 (two) times daily with a meal.   oxyCODONE-acetaminophen 5-325 MG tablet Commonly known as: PERCOCET/ROXICET Take 1 tablet by mouth every 6 (six) hours as needed for moderate pain or severe pain.   potassium chloride SA 20 MEQ tablet Commonly known as: KLOR-CON Take 1 tablet (20 mEq total) by mouth daily.   warfarin 5 MG tablet Commonly known as: Coumadin Take 1 tablet (5 mg total) by mouth daily. Notes to  patient: Take tonight @ 6pm      Verbal and written Discharge instructions given to the patient. Wound care per Discharge AVS  Follow-up Information    Cephus Shelling, MD Follow up in 4 week(s).   Specialty: Vascular Surgery Why: office will call Contact information: [address] [place] [address] [phone]        [facility] [address] Office Follow up on 07/16/2020.   Specialty: Cardiology Why: Coumadin clinic 07/16/20 at 2:30 be 20 min early thx Contact information: [address] [place] [address] [phone]              Signed: Deatra James  Epifanio Labrador 07/16/2020, 10:31 AM - For VQI Registry use --- Instructions: Press F2 to tab through selections.  Delete question if not applicable.   Post-op:  Wound infection: No  Graft infection: No  Transfusion: No  If yes,  units given New Arrhythmia: No Ipsilateral amputation: [ ]  no, [ ]  Minor, [ ]  BKA, [ ]  AKA Discharge patency: [ ]  Primary, [ ]  Primary assisted, [ ]  Secondary, [ ]  Occluded Patency judged by: [x ] Dopper only, [ ]  Palpable graft pulse, [ ]  Palpable distal pulse  D/C Ambulatory Status: Ambulatory  Complications: MI: [x ] No, [ ]  Troponin only, [ ]  EKG or Clinical CHF: No Resp failure: [x ] none, [ ]  Pneumonia, [ ]  Ventilator Chg in renal function: [x ] none, [ ]  Inc. Cr > 0.5, [ ]  Temp. Dialysis, [ ]  Permanent dialysis Stroke: [x ] None, [ ]  Minor, [ ]  Major Return to OR: No  Reason for return to OR: [ ]  Bleeding, [ ]  Infection, [ ]  Thrombosis, [ ]  Revision  Discharge medications: Statin use:  Yes ASA use:  Yes Plavix use:  No  for medical reason   Beta blocker use: Yes Coumadin use: Yes

## 2020-07-21 ENCOUNTER — Telehealth: Payer: Self-pay | Admitting: General Practice

## 2020-07-21 NOTE — Telephone Encounter (Signed)
I spoke to the patient and informed him to continue Warfarin until we see him next week 9/27.Robert Sanchez He verbalized understanding

## 2020-07-21 NOTE — Telephone Encounter (Signed)
    Pt r/s coumadin appt. He said his leg is in pain and couldn't travel today, pt r/s it next Monday 07/28/2020 and he wanted to ask if he needs to continue taking his blood thinner

## 2020-07-28 ENCOUNTER — Ambulatory Visit (INDEPENDENT_AMBULATORY_CARE_PROVIDER_SITE_OTHER): Payer: Self-pay

## 2020-07-28 ENCOUNTER — Other Ambulatory Visit: Payer: Self-pay

## 2020-07-28 DIAGNOSIS — I25810 Atherosclerosis of coronary artery bypass graft(s) without angina pectoris: Secondary | ICD-10-CM

## 2020-07-28 DIAGNOSIS — Z7901 Long term (current) use of anticoagulants: Secondary | ICD-10-CM

## 2020-07-28 LAB — POCT INR: INR: 7.7 — AB (ref 2.0–3.0)

## 2020-07-28 NOTE — Patient Instructions (Signed)
Hold 4 days Tuesday, Wednesday, Thursday and Friday and then decrease to 0.5 tablet daily except 1 tablet Sunday.  Recheck INR Monday 10/4

## 2020-07-31 ENCOUNTER — Encounter (HOSPITAL_COMMUNITY): Payer: Self-pay | Admitting: Emergency Medicine

## 2020-07-31 ENCOUNTER — Emergency Department (HOSPITAL_COMMUNITY)
Admission: EM | Admit: 2020-07-31 | Discharge: 2020-07-31 | Disposition: A | Discharge status: Home / Self Care | Payer: Self-pay | Attending: Emergency Medicine | Admitting: Emergency Medicine

## 2020-07-31 ENCOUNTER — Other Ambulatory Visit: Payer: Self-pay

## 2020-07-31 ENCOUNTER — Emergency Department (HOSPITAL_BASED_OUTPATIENT_CLINIC_OR_DEPARTMENT_OTHER): Payer: Self-pay

## 2020-07-31 ENCOUNTER — Emergency Department (HOSPITAL_COMMUNITY): Payer: Self-pay

## 2020-07-31 DIAGNOSIS — I739 Peripheral vascular disease, unspecified: Secondary | ICD-10-CM

## 2020-07-31 DIAGNOSIS — Z79899 Other long term (current) drug therapy: Secondary | ICD-10-CM | POA: Insufficient documentation

## 2020-07-31 DIAGNOSIS — T82868A Thrombosis of vascular prosthetic devices, implants and grafts, initial encounter: Secondary | ICD-10-CM | POA: Insufficient documentation

## 2020-07-31 DIAGNOSIS — E1140 Type 2 diabetes mellitus with diabetic neuropathy, unspecified: Secondary | ICD-10-CM | POA: Insufficient documentation

## 2020-07-31 DIAGNOSIS — N183 Chronic kidney disease, stage 3 unspecified: Secondary | ICD-10-CM | POA: Insufficient documentation

## 2020-07-31 DIAGNOSIS — E1110 Type 2 diabetes mellitus with ketoacidosis without coma: Secondary | ICD-10-CM | POA: Insufficient documentation

## 2020-07-31 DIAGNOSIS — Z7982 Long term (current) use of aspirin: Secondary | ICD-10-CM | POA: Insufficient documentation

## 2020-07-31 DIAGNOSIS — J449 Chronic obstructive pulmonary disease, unspecified: Secondary | ICD-10-CM | POA: Insufficient documentation

## 2020-07-31 DIAGNOSIS — I2510 Atherosclerotic heart disease of native coronary artery without angina pectoris: Secondary | ICD-10-CM | POA: Insufficient documentation

## 2020-07-31 DIAGNOSIS — L538 Other specified erythematous conditions: Secondary | ICD-10-CM

## 2020-07-31 DIAGNOSIS — Z87891 Personal history of nicotine dependence: Secondary | ICD-10-CM | POA: Insufficient documentation

## 2020-07-31 DIAGNOSIS — M7989 Other specified soft tissue disorders: Secondary | ICD-10-CM

## 2020-07-31 DIAGNOSIS — T85868A Thrombosis due to other internal prosthetic devices, implants and grafts, initial encounter: Secondary | ICD-10-CM

## 2020-07-31 DIAGNOSIS — Z7984 Long term (current) use of oral hypoglycemic drugs: Secondary | ICD-10-CM | POA: Insufficient documentation

## 2020-07-31 DIAGNOSIS — Z20822 Contact with and (suspected) exposure to covid-19: Secondary | ICD-10-CM | POA: Insufficient documentation

## 2020-07-31 LAB — COMPREHENSIVE METABOLIC PANEL
ALT: 16 U/L (ref 0–44)
AST: 15 U/L (ref 15–41)
Albumin: 3.7 g/dL (ref 3.5–5.0)
Alkaline Phosphatase: 51 U/L (ref 38–126)
Anion gap: 13 (ref 5–15)
BUN: 16 mg/dL (ref 8–23)
CO2: 22 mmol/L (ref 22–32)
Calcium: 9.2 mg/dL (ref 8.9–10.3)
Chloride: 99 mmol/L (ref 98–111)
Creatinine, Ser: 1.11 mg/dL (ref 0.61–1.24)
GFR calc Af Amer: >60 mL/min (ref >60)
GFR calc non Af Amer: >60 mL/min (ref >60)
Glucose, Bld: 170 mg/dL — ABNORMAL HIGH (ref 70–99)
Potassium: 4 mmol/L (ref 3.5–5.1)
Sodium: 134 mmol/L — ABNORMAL LOW (ref 135–145)
Total Bilirubin: 0.8 mg/dL (ref 0.3–1.2)
Total Protein: 7.9 g/dL (ref 6.5–8.1)

## 2020-07-31 LAB — PROTIME-INR
INR: 2.3 — ABNORMAL HIGH (ref 0.8–1.2)
Prothrombin Time: 24.6 seconds — ABNORMAL HIGH (ref 11.4–15.2)

## 2020-07-31 LAB — RESPIRATORY PANEL BY RT PCR (FLU A&B, COVID)
Influenza A by PCR: NEGATIVE
Influenza B by PCR: NEGATIVE
SARS Coronavirus 2 by RT PCR: NEGATIVE

## 2020-07-31 LAB — LACTIC ACID, PLASMA
Lactic Acid, Venous: 2.9 mmol/L (ref 0.5–1.9)
Lactic Acid, Venous: 2.8 mmol/L (ref 0.5–1.9)

## 2020-07-31 LAB — CBC
HCT: 34.4 % — ABNORMAL LOW (ref 39.0–52.0)
Hemoglobin: 11.2 g/dL — ABNORMAL LOW (ref 13.0–17.0)
MCH: 29.5 pg (ref 26.0–34.0)
MCHC: 32.6 g/dL (ref 30.0–36.0)
MCV: 90.5 fL (ref 80.0–100.0)
Platelets: 479 10*3/uL — ABNORMAL HIGH (ref 150–400)
RBC: 3.8 MIL/uL — ABNORMAL LOW (ref 4.22–5.81)
RDW: 14.1 % (ref 11.5–15.5)
WBC: 14.1 10*3/uL — ABNORMAL HIGH (ref 4.0–10.5)
nRBC: 0 % (ref 0.0–0.2)

## 2020-07-31 MED ORDER — CEFAZOLIN SODIUM-DEXTROSE 1-4 GM/50ML-% IV SOLN
1.0000 g | Freq: Once | INTRAVENOUS | Status: AC
  Administered 2020-07-31: 1 g via INTRAVENOUS
  Filled 2020-07-31: qty 50

## 2020-07-31 MED ORDER — SULFAMETHOXAZOLE-TRIMETHOPRIM 800-160 MG PO TABS
1.0000 | ORAL_TABLET | Freq: Once | ORAL | Status: AC
  Administered 2020-07-31: 1 via ORAL
  Filled 2020-07-31: qty 1

## 2020-07-31 MED ORDER — SODIUM CHLORIDE 0.9 % IV SOLN: Freq: Once | INTRAVENOUS | Status: AC

## 2020-07-31 MED ORDER — SULFAMETHOXAZOLE-TRIMETHOPRIM 800-160 MG PO TABS: 1.0000 | ORAL_TABLET | Freq: Two times a day (BID) | ORAL | 0 refills | Status: DC

## 2020-07-31 MED ORDER — IOHEXOL 350 MG/ML SOLN
100.0000 mL | Freq: Once | INTRAVENOUS | Status: AC | PRN
  Administered 2020-07-31: 100 mL via INTRAVENOUS

## 2020-07-31 NOTE — ED Triage Notes (Signed)
Pt  Here from home with c/o swelling and redness to his left foot pt has a fem pop 2 weeks ago hx of same

## 2020-07-31 NOTE — Consult Note (Signed)
Hospital Consult     Reason for Consult: Pain in left great toe with occluded composite bypass in left leg Referring Physician: ED MRN #:  [id number]  History of Present Illness: This is a 64 y.o. male with history of diabetes, coronary artery disease, peripheral vascular disease, tobacco abuse that presents for evaluation of swelling and pain in his left great toe.  States this has been going on for several weeks since discharge from the hospital.  CT shows occluded left CFA to PT bypass in left leg.    He has a somewhat complex history in the left leg.  He initially underwent left common femoral to PT bypass with reversed saphenous vein in 2019 for CLI with rest pain.  Ultimately he presented to the hospital earlier this year in March 2021 with an occluded bypass and underwent thrombolysis and subsequent percutaneous mechanical thrombectomy with angioplasty of the bypass and PT on 01/16/20- 01/17/2020. Ultimately his bypass was salvaged and he was discharged on Eliquis. During this event he did develop necrotic left great toe that we have been watching. He then recently presented again and his bypass had occluded a second time in September 2021 and underwent second attempt thrombolysis that was unsuccessful.  Ultimately during an attempted thrombolysis we took him back he developed thrombus in the common femoral and iliac and required thrombectomy and ultimately we performed a left common femoral to PT composite bypass using the distal vein on the PT since this appeared to be healthy.  CT tonight confirms this is now thrombosed.  He has been taking his Coumadin for high risk bypass.  INR was greater than 7 earlier in the week and he was told to hold his Coumadin by coumadin clinic.  Can move his foot fine.   Past Medical History:  Diagnosis Date  . Acute pulmonary edema (HCC) 06/05/2018  . Arthritis   . COPD (chronic obstructive pulmonary disease) (HCC)    " beginning stages " per pt  .  Coronary artery disease   . Diabetes mellitus without complication (HCC)   . Diabetic neuropathy (HCC)   . Diabetic neuropathy (HCC)   . Emphysema/COPD (HCC)    " beginning stages"  . GERD (gastroesophageal reflux disease)   . HOH (hard of hearing)   . Myocardial infarction (HCC)   . Peripheral vascular disease [facility])     Past Surgical History:  Procedure Laterality Date  . ABDOMINAL AORTOGRAM W/LOWER EXTREMITY N/A 06/21/2018   Procedure: ABDOMINAL AORTOGRAM W/LOWER EXTREMITY;  Surgeon: Cephus Shelling, MD;  Location: [facility];  Service: Cardiovascular;  Laterality: N/A;  . ABDOMINAL AORTOGRAM W/LOWER EXTREMITY Left 01/16/2020   Procedure: ABDOMINAL AORTOGRAM W/LOWER EXTREMITY;  Surgeon: Cephus Shelling, MD;  Location: [facility];  Service: Cardiovascular;  Laterality: Left;  . ABDOMINAL AORTOGRAM W/LOWER EXTREMITY N/A 07/09/2020   Procedure: ABDOMINAL AORTOGRAM W/LOWER EXTREMITY;  Surgeon: Cephus Shelling, MD;  Location: [facility];  Service: Cardiovascular;  Laterality: N/A;  TPA infusion  . CORONARY ARTERY BYPASS GRAFT N/A 06/12/2018   Procedure: CORONARY ARTERY BYPASS GRAFTING (CABG) x 3 WITH ENDOSCOPIC HARVESTING OF RIGHT GREATER SAPHENOUS VEIN. LIMA TO LAD. SVG TO PD. SVG TO DIAGONAL.;  Surgeon: Kerin Perna, MD;  Location: [facility] OR;  Service: Open Heart Surgery;  Laterality: N/A;  . FEMORAL-POPLITEAL BYPASS GRAFT Left 07/10/2020   Procedure: REDO EXPOSURE OF LEFT COMMON FEMORAL ARTERY LEFT COMMON FEMORAL TO POSTERIOR TIBIAL COMPOSITE BYPASS GRAFT;  Surgeon: Cephus Shelling, MD;  Location: [facility] OR;  Service: Vascular;  Laterality: Left;  . FEMORAL-TIBIAL BYPASS GRAFT Left 08/21/2018   Procedure: BYPASS GRAFT LEFT FEMORAL TO POSTERIOR TIBIAL ARTERY USING LEFT REVERSED GREAT SAPHENOUS VEIN;  Surgeon: Cephus Shelling, MD;  Location: [facility] OR;  Service: Vascular;  Laterality: Left;  . KNEE ARTHROSCOPY    . LOWER EXTREMITY ANGIOGRAPHY Left 01/17/2020     Procedure: Lower Extremity Angiography;  Surgeon: Cephus Shelling, MD;  Location: [facility] INVASIVE CV LAB;  Service: Cardiovascular;  Laterality: Left;  . PERIPHERAL VASCULAR BALLOON ANGIOPLASTY Left 01/17/2020   Procedure: PERIPHERAL VASCULAR BALLOON ANGIOPLASTY;  Surgeon: Cephus Shelling, MD;  Location: [facility];  Service: Cardiovascular;  Laterality: Left;  pt  . PERIPHERAL VASCULAR INTERVENTION Left 06/21/2018   Procedure: PERIPHERAL VASCULAR INTERVENTION;  Surgeon: Cephus Shelling, MD;  Location: [facility] INVASIVE CV LAB;  Service: Cardiovascular;  Laterality: Left;  . PERIPHERAL VASCULAR THROMBECTOMY Left 01/16/2020   Procedure: PERIPHERAL VASCULAR THROMBECTOMY;  Surgeon: Cephus Shelling, MD;  Location: [facility];  Service: Cardiovascular;  Laterality: Left;  Lytic Catheter Placement left fem-pop bypass  . PERIPHERAL VASCULAR THROMBECTOMY Left 01/17/2020   Procedure: PERIPHERAL VASCULAR THROMBECTOMY;  Surgeon: Cephus Shelling, MD;  Location: [facility];  Service: Cardiovascular;  Laterality: Left;  fem-pt bypass  . PERIPHERAL VASCULAR THROMBECTOMY Left 07/10/2020   Procedure: lysis recheck;  Surgeon: Cephus Shelling, MD;  Location: [facility] INVASIVE CV LAB;  Service: Cardiovascular;  Laterality: Left;  . RIGHT/LEFT HEART CATH AND CORONARY ANGIOGRAPHY N/A 06/05/2018   Procedure: RIGHT/LEFT HEART CATH AND CORONARY ANGIOGRAPHY;  Surgeon: Orpah Cobb, MD;  Location: [facility];  Service: Cardiovascular;  Laterality: N/A;  . SPINE SURGERY    . TEE WITHOUT CARDIOVERSION N/A 06/12/2018   Procedure: TRANSESOPHAGEAL ECHOCARDIOGRAM (TEE);  Surgeon: Donata Clay, Theron Arista, MD;  Location: [facility] OR;  Service: Open Heart Surgery;  Laterality: N/A;  . teeth extractions    . THROMBECTOMY ILIAC ARTERY Left 07/10/2020   Procedure: THROMBECTOMY OF LEFT EXTERNAL ILIAC AND LEFT COMMON FEMORAL AND LEFT POSTERIOR TIBIAL ARTERIES;  Surgeon: Cephus Shelling, MD;  Location: [facility] OR;   Service: Vascular;  Laterality: Left;  Marland Kitchen VEIN HARVEST Left 08/21/2018   Procedure: VEIN HARVEST LEFT GREAT SAPHENOUS VEIN;  Surgeon: Cephus Shelling, MD;  Location: [facility] OR;  Service: Vascular;  Laterality: Left;    Allergies  Allergen Reactions  . Lisinopril Other (See Comments)    Syncope  . Codeine Rash    Prior to Admission medications   Medication Sig Start Date End Date Taking? Authorizing Provider  aspirin EC 81 MG EC tablet Take 1 tablet (81 mg total) by mouth daily at 6 (six) AM. Swallow whole. 07/12/20   Lars Mage, PA-C  atorvastatin (LIPITOR) 40 MG tablet Take 1 tablet (40 mg total) by mouth at bedtime. 06/27/20   Janeece Agee, NP  carvedilol (COREG) 6.25 MG tablet Take 1 tablet (6.25 mg total) by mouth 2 (two) times daily with a meal. 06/27/20   Janeece Agee, NP  furosemide (LASIX) 40 MG tablet Take 1 tablet (40 mg total) by mouth daily. 06/27/20   Janeece Agee, NP  glipiZIDE (GLUCOTROL) 10 MG tablet Take 1 tablet (10 mg total) by mouth 2 (two) times daily before a meal. 06/27/20   Janeece Agee, NP  metFORMIN (GLUCOPHAGE) 1000 MG tablet Take 1 tablet (1,000 mg total) by mouth 2 (two) times daily with a meal. 06/27/20   Janeece Agee, NP  oxyCODONE-acetaminophen (PERCOCET/ROXICET) 5-325 MG tablet Take 1 tablet by mouth every 6 (six) hours as needed for moderate pain or severe pain. 07/12/20   Lars Mage, PA-C  potassium chloride SA (KLOR-CON) 20 MEQ tablet Take 1 tablet (20 mEq total) by mouth daily. 06/27/20   Janeece Agee, NP  warfarin (COUMADIN) 5 MG tablet Take 1 tablet (5 mg total) by mouth daily. 07/14/20   Lars Mage, PA-C    Social History   Socioeconomic History  . Marital status: Single    Spouse name: Not on file  . Number of children: Not on file  . Years of education: Not on file  . Highest education level: Not on file  Occupational History  . Not on file  Tobacco Use  . Smoking status: Former Smoker    Packs/day: 1.00     Years: 40.00    Pack years: 40.00    Quit date: 06/02/2018    Years since quitting: 2.1  . Smokeless tobacco: Never Used  Vaping Use  . Vaping Use: Never used  Substance and Sexual Activity  . Alcohol use: Not Currently    Comment: quit  . Drug use: Not Currently  . Sexual activity: Not on file  Other Topics Concern  . Not on file  Social History Narrative  . Not on file   Social Determinants of Health   Financial Resource Strain:   . Difficulty of Paying Living Expenses: Not on file  Food Insecurity:   . Worried About Programme researcher, broadcasting/film/video in the Last Year: Not on file  . Ran Out of Food in the Last Year: Not on file  Transportation Needs:   . Lack of Transportation (Medical): Not on file  . Lack of Transportation (Non-Medical): Not on file  Physical Activity:   . Days of Exercise per Week: Not on file  . Minutes of Exercise per Session: Not on file  Stress:   . Feeling of Stress : Not on file  Social Connections:   . Frequency of Communication with Friends and Family: Not on file  . Frequency of Social Gatherings with Friends and Family: Not on file  . Attends Religious Services: Not on file  . Active Member of Clubs or Organizations: Not on file  . Attends Banker Meetings: Not on file  . Marital Status: Not on file  Intimate Partner Violence:   . Fear of Current or Ex-Partner: Not on file  . Emotionally Abused: Not on file  . Physically Abused: Not on file  . Sexually Abused: Not on file     Family History  Problem Relation Age of Onset  . Diabetes Mother   . Lung disease Mother   . Cancer Father   . Heart disease Father     ROS: [x]  Positive   [ ]  Negative   [ ]  All sytems reviewed and are negative  Cardiovascular: []  chest pain/pressure []  palpitations []  SOB lying flat []  DOE []  pain in legs while walking []  pain in legs at rest []  pain in legs at night []  non-healing ulcers []  hx of DVT []  swelling in legs  Pulmonary: []   productive cough []  asthma/wheezing []  home O2  Neurologic: []  weakness in []  arms []  legs []  numbness in []  arms []  legs []  hx of CVA []  mini stroke [] difficulty speaking or slurred speech []  temporary loss of vision in one eye []  dizziness  Hematologic: []  hx of cancer []  bleeding problems []  problems with  blood clotting easily  Endocrine:   []  diabetes []  thyroid disease  GI []  vomiting blood []  blood in stool  GU: []  CKD/renal failure []  HD--[]  M/W/F or []  T/T/S []  burning with urination []  blood in urine  Psychiatric: []  anxiety []  depression  Musculoskeletal: []  arthritis []  joint pain  Integumentary: []  rashes []  ulcers  Constitutional: []  fever []  chills   Physical Examination  Vitals:   07/31/20 1651 07/31/20 1830  BP: 139/69 137/62  Pulse: 88 82  Resp: (!) 21 15  Temp: 98.5 F (36.9 C)   SpO2: 100%    There is no height or weight on file to calculate BMI.  General:  WDWN in NAD Gait: Not observed HENT: WNL, normocephalic Pulmonary: normal non-labored breathing, without Rales, rhonchi,  wheezing Cardiac: regular, without  Murmurs, rubs or gallops Abdomen: soft, NT/ND, no masses Vascular Exam/Pulses: Left groin incision macerated but no active infection Left femoral pulse palpable Left DP/PT/Peroneal monophasic signals Extremities: Dry gangrene left great toe, able to wiggle toes, states chronic stable numbness in left foot Musculoskeletal: no muscle wasting or atrophy  Neurologic: A&O X 3; Appropriate Affect ; SENSATION: normal; MOTOR FUNCTION:  moving all extremities equally. Speech is fluent/normal     CBC    Component Value Date/Time   WBC 14.1 (H) 07/31/2020 1139   RBC 3.80 (L) 07/31/2020 1139   HGB 11.2 (L) 07/31/2020 1139   HCT 34.4 (L) 07/31/2020 1139   PLT 479 (H) 07/31/2020 1139   MCV 90.5 07/31/2020 1139   MCH 29.5 07/31/2020 1139   MCHC 32.6 07/31/2020 1139   RDW 14.1 07/31/2020 1139   LYMPHSABS 1.5 01/15/2020  0750   MONOABS 1.2 (H) 01/15/2020 0750   EOSABS 0.2 01/15/2020 0750   BASOSABS 0.1 01/15/2020 0750    BMET    Component Value Date/Time   NA 134 (L) 07/31/2020 1139   NA 137 06/27/2020 1216   K 4.0 07/31/2020 1139   CL 99 07/31/2020 1139   CO2 22 07/31/2020 1139   GLUCOSE 170 (H) 07/31/2020 1139   BUN 16 07/31/2020 1139   BUN 17 06/27/2020 1216   CREATININE 1.11 07/31/2020 1139   CREATININE 0.81 08/14/2014 1126   CALCIUM 9.2 07/31/2020 1139   GFRNONAA >60 07/31/2020 1139   GFRNONAA >89 08/14/2014 1126   GFRAA >60 07/31/2020 1139   GFRAA >89 08/14/2014 1126    COAGS: Lab Results  Component Value Date   INR 2.3 (H) 07/31/2020   INR 7.7 (A) 07/28/2020   INR 5.8 (A) 07/16/2020     Non-Invasive Vascular Imaging:    I reviewed his CTA abdomen pelvis and agree he has a left common femoral to PT composite bypass that is occluded.    ASSESSMENT/PLAN: This is a 64 y.o. male presents with occluded left common femoral to PT composite bypass that was recently performed on 07/10/2020 in the setting of attempted limb salvage given a previous left common femoral to PT bypass with vein in 2019 that has failed multiple times this year.  Ultimately discussed that without revascularization he is going to be at exceedingly high risk for limb loss and I have no expectation his toe will heal.  No active signs of infection at this time and has had this toe wound since March of this year and dry gangrene at the tip.  I did offer an opportunity for admission tonight if he feels his pain is uncontrolled and we can hydrate him and plan for surgery on Monday.  Ultimately he is not interested in admission and wants to be discharged home.  We will get his Covid test tonight and then I will have him self isolate.  I will put him on the OR schedule for Monday for a re-do left femoral to distal bypass which may include thrombectomizing his composite bypass proximally and using this to perform jump graft to a  new target like the TP trunk based on his most recent arteriogram from recent lysis attemp.  I have asked that he stop his Coumadin to let his INR drift down.  We will put him on some oral antibiotics over the weekend.  He realizes he is at exceedingly high risk for limb loss even with surgery given more limited options.  No vein in right leg, left leg vein harvested.  Discussed would use prosthetic and risk for infection, bypass failure, risk of anesthesia, bleeding, etc.  Cephus Shelling, MD [facility] Office: [phone]  Cephus Shelling

## 2020-07-31 NOTE — ED Provider Notes (Signed)
[facility] EMERGENCY DEPARTMENT Provider Note   CSN: [id number] Arrival date & time: 07/31/20  1057     History No chief complaint on file.   Robert Sanchez is a 64 y.o. male.  Pt presents to the ED today with left foot swelling and redness for the past few days.  The pt has a hx of left common femoral to PT bypass initially in 2019.  This graft occluded and he underwent thrombolysis and thrombectomy with angioplasty of the bypass and PT in March.  The pt's bypass was discovered to be occluded on 07/09/20 and he had another re-do bypass on 07/10/20.  He was d/c on 9/13 on coumadin b/c he can't afford Xarelto or Eliquis.  Pt said he's been going to the coumadin clinic and his INR was 7.7 on 9/27.  He was told to hold his coumadin.  He denies any fevers.        Past Medical History:  Diagnosis Date  . Acute pulmonary edema (HCC) 06/05/2018  . Arthritis   . COPD (chronic obstructive pulmonary disease) (HCC)    " beginning stages " per pt  . Coronary artery disease   . Diabetes mellitus without complication (HCC)   . Diabetic neuropathy (HCC)   . Diabetic neuropathy (HCC)   . Emphysema/COPD (HCC)    " beginning stages"  . GERD (gastroesophageal reflux disease)   . HOH (hard of hearing)   . Myocardial infarction (HCC)   . Peripheral vascular disease [facility])     Patient Active Problem List   Diagnosis Date Noted  . Acute venous embolism and thrombosis of deep vessels of proximal lower extremity (HCC) 07/16/2020  . Long term (current) use of anticoagulants 07/16/2020  . Cellulitis of foot 01/15/2020  . Critical ischemia of extremity with history of revascularization of same extremity 01/15/2020  . Leukocytosis 01/15/2020  . CKD (chronic kidney disease), stage III 01/15/2020  . Acute on chronic systolic CHF (congestive heart failure) (HCC) 05/04/2019  . Mild renal insufficiency 05/04/2019  . LFT elevation 05/04/2019  . PAD (peripheral artery disease) (HCC)  08/21/2018  . PVD (peripheral vascular disease) (HCC) 07/18/2018  . 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 CAD (coronary artery disease) 06/05/2018  . Pulmonary edema 06/03/2018  . DKA, type 2 (HCC) 06/03/2018  . Diabetic acidosis without coma (HCC)   . Sebaceous cyst 08/23/2016  . Acute pain of right knee 08/23/2016  . Hidradenitis 08/23/2016  . Right hip pain 08/14/2014  . Diabetes mellitus type II, non insulin dependent (HCC) 11/27/2013  . Arthritis 11/27/2013  . Neuropathy in diabetes (HCC) 11/27/2013    Past Surgical History:  Procedure Laterality Date  . ABDOMINAL AORTOGRAM W/LOWER EXTREMITY N/A 06/21/2018   Procedure: ABDOMINAL AORTOGRAM W/LOWER EXTREMITY;  Surgeon: Cephus Shelling, MD;  Location: [facility] INVASIVE CV LAB;  Service: Cardiovascular;  Laterality: N/A;  . ABDOMINAL AORTOGRAM W/LOWER EXTREMITY Left 01/16/2020   Procedure: ABDOMINAL AORTOGRAM W/LOWER EXTREMITY;  Surgeon: Cephus Shelling, MD;  Location: [facility] INVASIVE CV LAB;  Service: Cardiovascular;  Laterality: Left;  . ABDOMINAL AORTOGRAM W/LOWER EXTREMITY N/A 07/09/2020   Procedure: ABDOMINAL AORTOGRAM W/LOWER EXTREMITY;  Surgeon: Cephus Shelling, MD;  Location: [facility] INVASIVE CV LAB;  Service: Cardiovascular;  Laterality: N/A;  TPA infusion  . CORONARY ARTERY BYPASS GRAFT N/A 06/12/2018   Procedure: CORONARY ARTERY BYPASS GRAFTING (CABG) x 3 WITH ENDOSCOPIC HARVESTING OF RIGHT GREATER SAPHENOUS VEIN. LIMA TO LAD. SVG TO PD. SVG TO DIAGONAL.;  Surgeon: Donata Clay,  Theron Arista, MD;  Location: [facility] OR;  Service: Open Heart Surgery;  Laterality: N/A;  . FEMORAL-POPLITEAL BYPASS GRAFT Left 07/10/2020   Procedure: REDO EXPOSURE OF LEFT COMMON FEMORAL ARTERY LEFT COMMON FEMORAL TO POSTERIOR TIBIAL COMPOSITE BYPASS GRAFT;  Surgeon: Cephus Shelling, MD;  Location: [facility] OR;  Service: Vascular;  Laterality: Left;  . FEMORAL-TIBIAL BYPASS GRAFT Left 08/21/2018   Procedure: BYPASS GRAFT LEFT FEMORAL TO POSTERIOR TIBIAL ARTERY USING LEFT REVERSED  GREAT SAPHENOUS VEIN;  Surgeon: Cephus Shelling, MD;  Location: [facility] OR;  Service: Vascular;  Laterality: Left;  . KNEE ARTHROSCOPY    . LOWER EXTREMITY ANGIOGRAPHY Left 01/17/2020   Procedure: Lower Extremity Angiography;  Surgeon: Cephus Shelling, MD;  Location: [facility] INVASIVE CV LAB;  Service: Cardiovascular;  Laterality: Left;  . PERIPHERAL VASCULAR BALLOON ANGIOPLASTY Left 01/17/2020   Procedure: PERIPHERAL VASCULAR BALLOON ANGIOPLASTY;  Surgeon: Cephus Shelling, MD;  Location: [facility] INVAS[facility] CV LAB;  Service: Cardiovascular;  Laterality: Left;  pt  . PERIPHERAL VASCULAR INTERVENTION Left 06/21/2018   Procedure: PERIPHERAL VASCULAR INTERVENTION;  Surgeon: Cephus Shelling, MD;  Location: [facility] INVASIVE CV LAB;  Service: Cardiovascular;  Laterality: Left;  . PERIPHERAL VASCULAR THROMBECTOMY Left 01/16/2020   Procedure: PERIPHERAL VASCULAR THROMBECTOMY;  Surgeon: Cephus Shelling, MD;  Location: [facility] INVAS[facility] CV LAB;  Service: Cardiovascular;  Laterality: Left;  Lytic Catheter Placement left fem-pop bypass  . PERIPHERAL VASCULAR THROMBECTOMY Left 01/17/2020   Procedure: PERIPHERAL VASCULAR THROMBECTOMY;  Surgeon: Cephus Shelling, MD;  Location: [facility] INVAS[facility] CV LAB;  Service: Cardiovascular;  Laterality: Left;  fem-pt bypass  . PERIPHERAL VASCULAR THROMBECTOMY Left 07/10/2020   Procedure: lysis recheck;  Surgeon: Cephus Shelling, MD;  Location: [facility] INVASIVE CV LAB;  Service: Cardiovascular;  Laterality: Left;  . RIGHT/LEFT HEART CATH AND CORONARY ANGIOGRAPHY N/A 06/05/2018   Procedure: RIGHT/LEFT HEART CATH AND CORONARY ANGIOGRAPHY;  Surgeon: Orpah Cobb, MD;  Location: [facility] INVASIVE CV LAB;  Service: Cardiovascular;  Laterality: N/A;  . SPINE SURGERY    . TEE WITHOUT CARDIOVERSION N/A 06/12/2018   Procedure: TRANSESOPHAGEAL ECHOCARDIOGRAM (TEE);  Surgeon: Donata Clay, Theron Arista, MD;  Location: [facility] OR;  Service: Open Heart Surgery;  Laterality: N/A;  . teeth extractions    . THROMBECTOMY ILIAC  ARTERY Left 07/10/2020   Procedure: THROMBECTOMY OF LEFT EXTERNAL ILIAC AND LEFT COMMON FEMORAL AND LEFT POSTERIOR TIBIAL ARTERIES;  Surgeon: Cephus Shelling, MD;  Location: [facility] OR;  Service: Vascular;  Laterality: Left;  Marland Kitchen VEIN HARVEST Left 08/21/2018   Procedure: VEIN HARVEST LEFT GREAT SAPHENOUS VEIN;  Surgeon: Cephus Shelling, MD;  Location: [facility] OR;  Service: Vascular;  Laterality: Left;       Family History  Problem Relation Age of Onset  . Diabetes Mother   . Lung disease Mother   . Cancer Father   . Heart disease Father     Social History   Tobacco Use  . Smoking status: Former Smoker    Packs/day: 1.00    Years: 40.00    Pack years: 40.00    Quit date: 06/02/2018    Years since quitting: 2.1  . Smokeless tobacco: Never Used  Vaping Use  . Vaping Use: Never used  Substance Use Topics  . Alcohol use: Not Currently    Comment: quit  . Drug use: Not Currently    Home Medications Prior to Admission medications   Medication Sig Start Date End Date Taking? Authorizing Provider  aspirin EC 81 MG EC tablet Take 1 tablet (81  mg total) by mouth daily at 6 (six) AM. Swallow whole. 07/12/20   Lars Mage, PA-C  atorvastatin (LIPITOR) 40 MG tablet Take 1 tablet (40 mg total) by mouth at bedtime. 06/27/20   Janeece Agee, NP  carvedilol (COREG) 6.25 MG tablet Take 1 tablet (6.25 mg total) by mouth 2 (two) times daily with a meal. 06/27/20   Janeece Agee, NP  furosemide (LASIX) 40 MG tablet Take 1 tablet (40 mg total) by mouth daily. 06/27/20   Janeece Agee, NP  glipiZIDE (GLUCOTROL) 10 MG tablet Take 1 tablet (10 mg total) by mouth 2 (two) times daily before a meal. 06/27/20   Janeece Agee, NP  metFORMIN (GLUCOPHAGE) 1000 MG tablet Take 1 tablet (1,000 mg total) by mouth 2 (two) times daily with a meal. 06/27/20   Janeece Agee, NP  oxyCODONE-acetaminophen (PERCOCET/ROXICET) 5-325 MG tablet Take 1 tablet by mouth every 6 (six) hours as needed for moderate pain or  severe pain. 07/12/20   Lars Mage, PA-C  potassium chloride SA (KLOR-CON) 20 MEQ tablet Take 1 tablet (20 mEq total) by mouth daily. 06/27/20   Janeece Agee, NP  sulfamethoxazole-trimethoprim (BACTRIM DS) 800-160 MG tablet Take 1 tablet by mouth 2 (two) times daily for 7 days. 07/31/20 08/07/20  Jacalyn Lefevre, MD  warfarin (COUMADIN) 5 MG tablet Take 1 tablet (5 mg total) by mouth daily. 07/14/20   Lars Mage, PA-C    Allergies    Lisinopril and Codeine  Review of Systems   Review of Systems  Skin:       Left foot redness and swelling  All other systems reviewed and are negative.   Physical Exam Updated Vital Signs BP 137/62   Pulse 82   Temp 98.5 F (36.9 C) (Oral)   Resp 15   SpO2 100%   Physical Exam Vitals and nursing note reviewed.  Constitutional:      Appearance: Normal appearance.  HENT:     Head: Normocephalic and atraumatic.     Right Ear: External ear normal.     Left Ear: External ear normal.     Nose: Nose normal.     Mouth/Throat:     Mouth: Mucous membranes are moist.     Pharynx: Oropharynx is clear.  Eyes:     Extraocular Movements: Extraocular movements intact.     Conjunctiva/sclera: Conjunctivae normal.     Pupils: Pupils are equal, round, and reactive to light.  Cardiovascular:     Rate and Rhythm: Normal rate and regular rhythm.     Heart sounds: Normal heart sounds.     Comments: dopplerable pulses bilateral feet Pulmonary:     Effort: Pulmonary effort is normal.     Breath sounds: Normal breath sounds.  Abdominal:     General: Abdomen is flat. Bowel sounds are normal.     Palpations: Abdomen is soft.  Musculoskeletal:     Cervical back: Normal range of motion and neck supple.  Skin:    Capillary Refill: Capillary refill takes less than 2 seconds.     Comments: See picture.  Healing wound left great toe.  Neurological:     Mental Status: He is alert.  Psychiatric:        Mood and Affect: Mood normal.        Behavior:  Behavior normal.       ED Results / Procedures / Treatments   Labs (all labs ordered are listed, but only abnormal results are displayed) Labs Reviewed  CBC -  Abnormal; Notable for the following components:      Result Value   WBC 14.1 (*)    RBC 3.80 (*)    Hemoglobin 11.2 (*)    HCT 34.4 (*)    Platelets 479 (*)    All other components within normal limits  COMPREHENSIVE METABOLIC PANEL - Abnormal; Notable for the following components:   Sodium 134 (*)    Glucose, Bld 170 (*)    All other components within normal limits  LACTIC ACID, PLASMA - Abnormal; Notable for the following components:   Lactic Acid, Venous 2.9 (*)    All other components within normal limits  LACTIC ACID, PLASMA - Abnormal; Notable for the following components:   Lactic Acid, Venous 2.8 (*)    All other components within normal limits  PROTIME-INR - Abnormal; Notable for the following components:   Prothrombin Time 24.6 (*)    INR 2.3 (*)    All other components within normal limits  CULTURE, BLOOD (ROUTINE X 2)  CULTURE, BLOOD (ROUTINE X 2)  RESPIRATORY PANEL BY RT PCR (FLU A&B, COVID)    EKG None  Radiology VAS US LOWER EXTREMITY VENOUS (DVT) (ONLY [facility])  Result Date: 07/31/2020  Lower Venous DVTStudy Indications: Swelling, and Erythema.  Risk Factors: Surgery 07-10-2020 Redo LT femoral-posterior tibial bypass graft. Anticoagulation: Coumadin. Limitations: Poor ultrasound/tissue interface. Comparison Study: No prior studies. Performing Technologist: Jean Rosenthalachel Hodge  Examination Guidelines: A complete evaluation includes B-mode imaging, spectral Doppler, color Doppler, and power Doppler as needed of all accessible portions of each vessel. Bilateral testing is considered an integral part of a complete examination. Limited examinations for reoccurring indications may be performed as noted. The reflux portion of the exam is performed with the patient in reverse Trendelenburg.   +-----+---------------+---------+-----------+----------+--------------+ RIGHTCompressibilityPhasicitySpontaneityPropertiesThrombus Aging +-----+---------------+---------+-----------+----------+--------------+ CFV  Full           Yes      Yes                                 +-----+---------------+---------+-----------+----------+--------------+   +---------+---------------+---------+-----------+----------+--------------+ LEFT     CompressibilityPhasicitySpontaneityPropertiesThrombus Aging +---------+---------------+---------+-----------+----------+--------------+ CFV      Full           Yes      Yes                                 +---------+---------------+---------+-----------+----------+--------------+ SFJ      Full                                                        +---------+---------------+---------+-----------+----------+--------------+ FV Prox  Full                                                        +---------+---------------+---------+-----------+----------+--------------+ FV Mid   Full                                                        +---------+---------------+---------+-----------+----------+--------------+  FV DistalFull                                                        +---------+---------------+---------+-----------+----------+--------------+ PFV      Full                                                        +---------+---------------+---------+-----------+----------+--------------+ POP      Full           Yes      Yes                                 +---------+---------------+---------+-----------+----------+--------------+ PTV      Full                                                        +---------+---------------+---------+-----------+----------+--------------+ PERO                                                  Not visualized  +---------+---------------+---------+-----------+----------+--------------+     Summary: RIGHT: - No evidence of common femoral vein obstruction.  LEFT: - There is no evidence of deep vein thrombosis in the lower extremity.  - No cystic structure found in the popliteal fossa. - Ultrasound characteristics of enlarged lymph nodes noted in the groin. -Indicental finding: LT stent occlusion.  *See table(s) above for measurements and observations.    Preliminary     Procedures Procedures (including critical care time)  Medications Ordered in ED Medications  sulfamethoxazole-trimethoprim (BACTRIM DS) 800-160 MG per tablet 1 tablet (has no administration in time range)  0.9 %  sodium chloride infusion ( Intravenous Stopped 07/31/20 2018)  ceFAZolin (ANCEF) IVPB 1 g/50 mL premix (0 g Intravenous Stopped 07/31/20 2018)  iohexol (OMNIPAQUE) 350 MG/ML injection 100 mL (100 mLs Intravenous Contrast Given 07/31/20 1911)    ED Course  I have reviewed the triage vital signs and the nursing notes.  Pertinent labs & imaging results that were available during my care of the patient were reviewed by me and considered in my medical decision making (see chart for details).    MDM Rules/Calculators/A&P                          Pt d/w Dr. Chestine Spore who is the one who has operated on this patient.  He saw pt in the ED.  He talked with the patient about admission.  Pt wants to go home.  He will arrange for pt to get his surgery on Monday, 10/4.  Pt will get a covid swab prior to d/c.  He is to hold his coumadin.  He will be d/c on bactrim.  Return if worse.   Final Clinical Impression(s) / ED Diagnoses Final diagnoses:  PVD (  peripheral vascular disease) (HCC)  Graft failure due to thrombosis, initial encounter    Rx / DC Orders ED Discharge Orders         Ordered    sulfamethoxazole-trimethoprim (BACTRIM DS) 800-160 MG tablet  2 times daily        07/31/20 2144           Jacalyn Lefevre, MD 07/31/20  2145

## 2020-07-31 NOTE — Discharge Instructions (Addendum)
Do not take your coumadin.  Isolate all weekend.  Take your antibiotics.  Dr. Ophelia Charter office will let you know what time to arrive for your surgery on Monday the 4th.

## 2020-07-31 NOTE — Progress Notes (Signed)
Lower extremity venous LT study completed.  Preliminary results relayed to Particia Nearing, MD.   See CV Proc for preliminary results report.   Jean Rosenthal, RDMS

## 2020-08-01 ENCOUNTER — Other Ambulatory Visit: Payer: Self-pay

## 2020-08-01 ENCOUNTER — Encounter (HOSPITAL_COMMUNITY): Payer: Self-pay | Admitting: Vascular Surgery

## 2020-08-01 NOTE — Progress Notes (Signed)
Patient denies shortness of breath, fever, cough or chest pain.  PCP - [facility] Cardiologist - n/a  Chest x-ray - 01/15/20 (1V) EKG - 01/16/20 Stress Test - 06/09/18 ECHO TEE - 06/12/18  Cardiac Cath - 06/05/18  Fasting Blood Sugar - 170-200s Checks Blood Sugar _2_ times a day  . Do not take oral diabetes medicines (glipizde and metformin) the morning of surgery.  Do not take glipizide Sun night dose.  . If your blood sugar is less than 70 mg/dL, you will need to treat for low blood sugar: o Treat a low blood sugar (less than 70 mg/dL) with  cup of clear juice (cranberry or apple), 4 glucose tablets, OR glucose gel. o Recheck blood sugar in 15 minutes after treatment (to make sure it is greater than 70 mg/dL). If your blood sugar is not greater than 70 mg/dL on recheck, call [phone] for further instructions.  Blood Thinner Instructions:  Last dose of coumadin was on 07/28/20.  Anesthesia review: Yes  STOP now taking any Aspirin (unless otherwise instructed by your surgeon), Aleve, Naproxen, Ibuprofen, Motrin, Advil, Goody's, BC's, all herbal medications, fish oil, and all vitamins.   Coronavirus Screening Covid test on 07/31/20 was negative.  Patient verbalized understanding of instructions that were given via phone.

## 2020-08-04 ENCOUNTER — Encounter (HOSPITAL_COMMUNITY): Payer: Self-pay | Admitting: Vascular Surgery

## 2020-08-04 ENCOUNTER — Other Ambulatory Visit: Payer: Self-pay

## 2020-08-04 ENCOUNTER — Inpatient Hospital Stay (HOSPITAL_COMMUNITY): Payer: Self-pay

## 2020-08-04 ENCOUNTER — Inpatient Hospital Stay (HOSPITAL_COMMUNITY): Payer: Self-pay | Admitting: Physician Assistant

## 2020-08-04 ENCOUNTER — Inpatient Hospital Stay (HOSPITAL_COMMUNITY)
Admission: RE | Admit: 2020-08-04 | Discharge: 2020-08-07 | DRG: 271 | Disposition: A | Discharge status: Home / Self Care | Payer: Self-pay | Attending: Vascular Surgery | Admitting: Vascular Surgery

## 2020-08-04 ENCOUNTER — Encounter (HOSPITAL_COMMUNITY)
Admission: RE | Disposition: A | Discharge status: Home / Self Care | Payer: Self-pay | Source: Home / Self Care | Attending: Vascular Surgery

## 2020-08-04 DIAGNOSIS — Z20822 Contact with and (suspected) exposure to covid-19: Secondary | ICD-10-CM | POA: Diagnosis present

## 2020-08-04 DIAGNOSIS — H9190 Unspecified hearing loss, unspecified ear: Secondary | ICD-10-CM | POA: Diagnosis present

## 2020-08-04 DIAGNOSIS — Z7984 Long term (current) use of oral hypoglycemic drugs: Secondary | ICD-10-CM

## 2020-08-04 DIAGNOSIS — E785 Hyperlipidemia, unspecified: Secondary | ICD-10-CM | POA: Diagnosis present

## 2020-08-04 DIAGNOSIS — K219 Gastro-esophageal reflux disease without esophagitis: Secondary | ICD-10-CM | POA: Diagnosis present

## 2020-08-04 DIAGNOSIS — Z888 Allergy status to other drugs, medicaments and biological substances status: Secondary | ICD-10-CM

## 2020-08-04 DIAGNOSIS — Z7901 Long term (current) use of anticoagulants: Secondary | ICD-10-CM

## 2020-08-04 DIAGNOSIS — N183 Chronic kidney disease, stage 3 unspecified: Secondary | ICD-10-CM | POA: Diagnosis present

## 2020-08-04 DIAGNOSIS — Z9119 Patient's noncompliance with other medical treatment and regimen: Secondary | ICD-10-CM

## 2020-08-04 DIAGNOSIS — T82868A Thrombosis of vascular prosthetic devices, implants and grafts, initial encounter: Secondary | ICD-10-CM

## 2020-08-04 DIAGNOSIS — I9789 Other postprocedural complications and disorders of the circulatory system, not elsewhere classified: Secondary | ICD-10-CM

## 2020-08-04 DIAGNOSIS — T82856A Stenosis of peripheral vascular stent, initial encounter: Secondary | ICD-10-CM | POA: Diagnosis present

## 2020-08-04 DIAGNOSIS — I252 Old myocardial infarction: Secondary | ICD-10-CM

## 2020-08-04 DIAGNOSIS — E1151 Type 2 diabetes mellitus with diabetic peripheral angiopathy without gangrene: Secondary | ICD-10-CM | POA: Diagnosis present

## 2020-08-04 DIAGNOSIS — Z8249 Family history of ischemic heart disease and other diseases of the circulatory system: Secondary | ICD-10-CM

## 2020-08-04 DIAGNOSIS — E1122 Type 2 diabetes mellitus with diabetic chronic kidney disease: Secondary | ICD-10-CM | POA: Diagnosis present

## 2020-08-04 DIAGNOSIS — Z87891 Personal history of nicotine dependence: Secondary | ICD-10-CM

## 2020-08-04 DIAGNOSIS — G8918 Other acute postprocedural pain: Secondary | ICD-10-CM | POA: Diagnosis not present

## 2020-08-04 DIAGNOSIS — I2510 Atherosclerotic heart disease of native coronary artery without angina pectoris: Secondary | ICD-10-CM | POA: Diagnosis present

## 2020-08-04 DIAGNOSIS — Z23 Encounter for immunization: Secondary | ICD-10-CM

## 2020-08-04 DIAGNOSIS — Y718 Miscellaneous cardiovascular devices associated with adverse incidents, not elsewhere classified: Secondary | ICD-10-CM | POA: Diagnosis present

## 2020-08-04 DIAGNOSIS — Z951 Presence of aortocoronary bypass graft: Secondary | ICD-10-CM

## 2020-08-04 DIAGNOSIS — I739 Peripheral vascular disease, unspecified: Secondary | ICD-10-CM

## 2020-08-04 DIAGNOSIS — Z7982 Long term (current) use of aspirin: Secondary | ICD-10-CM

## 2020-08-04 DIAGNOSIS — I70262 Atherosclerosis of native arteries of extremities with gangrene, left leg: Principal | ICD-10-CM | POA: Diagnosis present

## 2020-08-04 DIAGNOSIS — J439 Emphysema, unspecified: Secondary | ICD-10-CM | POA: Diagnosis present

## 2020-08-04 DIAGNOSIS — Z833 Family history of diabetes mellitus: Secondary | ICD-10-CM

## 2020-08-04 HISTORY — PX: THROMBECTOMY FEMORAL ARTERY: SHX6406

## 2020-08-04 HISTORY — PX: FEMORAL-TIBIAL BYPASS GRAFT: SHX938

## 2020-08-04 HISTORY — PX: AMPUTATION: SHX166

## 2020-08-04 HISTORY — PX: APPLICATION OF WOUND VAC: SHX5189

## 2020-08-04 HISTORY — DX: Hyperlipidemia, unspecified: E78.5

## 2020-08-04 HISTORY — PX: LOWER EXTREMITY ANGIOGRAM: SHX5508

## 2020-08-04 LAB — URINALYSIS, ROUTINE W REFLEX MICROSCOPIC
Bacteria, UA: NONE SEEN
Bilirubin Urine: NEGATIVE
Glucose, UA: NEGATIVE mg/dL
Hgb urine dipstick: NEGATIVE
Ketones, ur: NEGATIVE mg/dL
Nitrite: NEGATIVE
Protein, ur: NEGATIVE mg/dL
Specific Gravity, Urine: 1.018 (ref 1.005–1.030)
pH: 5 (ref 5.0–8.0)

## 2020-08-04 LAB — CBC
HCT: 36.9 % — ABNORMAL LOW (ref 39.0–52.0)
Hemoglobin: 11.3 g/dL — ABNORMAL LOW (ref 13.0–17.0)
MCH: 28.8 pg (ref 26.0–34.0)
MCHC: 30.6 g/dL (ref 30.0–36.0)
MCV: 93.9 fL (ref 80.0–100.0)
Platelets: 400 10*3/uL (ref 150–400)
RBC: 3.93 MIL/uL — ABNORMAL LOW (ref 4.22–5.81)
RDW: 14 % (ref 11.5–15.5)
WBC: 9.8 10*3/uL (ref 4.0–10.5)
nRBC: 0 % (ref 0.0–0.2)

## 2020-08-04 LAB — PROTIME-INR
INR: 1.2 (ref 0.8–1.2)
Prothrombin Time: 14.8 seconds (ref 11.4–15.2)

## 2020-08-04 LAB — COMPREHENSIVE METABOLIC PANEL
ALT: 18 U/L (ref 0–44)
AST: 15 U/L (ref 15–41)
Albumin: 3.7 g/dL (ref 3.5–5.0)
Alkaline Phosphatase: 51 U/L (ref 38–126)
Anion gap: 12 (ref 5–15)
BUN: 30 mg/dL — ABNORMAL HIGH (ref 8–23)
CO2: 19 mmol/L — ABNORMAL LOW (ref 22–32)
Calcium: 9.4 mg/dL (ref 8.9–10.3)
Chloride: 104 mmol/L (ref 98–111)
Creatinine, Ser: 1.48 mg/dL — ABNORMAL HIGH (ref 0.61–1.24)
GFR calc Af Amer: 58 mL/min — ABNORMAL LOW (ref >60)
GFR calc non Af Amer: 50 mL/min — ABNORMAL LOW (ref >60)
Glucose, Bld: 167 mg/dL — ABNORMAL HIGH (ref 70–99)
Potassium: 4.7 mmol/L (ref 3.5–5.1)
Sodium: 135 mmol/L (ref 135–145)
Total Bilirubin: 0.9 mg/dL (ref 0.3–1.2)
Total Protein: 8 g/dL (ref 6.5–8.1)

## 2020-08-04 LAB — GLUCOSE, CAPILLARY
Glucose-Capillary: 157 mg/dL — ABNORMAL HIGH (ref 70–99)
Glucose-Capillary: 166 mg/dL — ABNORMAL HIGH (ref 70–99)
Glucose-Capillary: 171 mg/dL — ABNORMAL HIGH (ref 70–99)

## 2020-08-04 LAB — TYPE AND SCREEN
ABO/RH(D): O NEG
Antibody Screen: NEGATIVE

## 2020-08-04 LAB — APTT: aPTT: 31 seconds (ref 24–36)

## 2020-08-04 SURGERY — THROMBECTOMY, ARTERY, FEMORAL
Anesthesia: General | Site: Toe | Laterality: Left

## 2020-08-04 MED ORDER — POTASSIUM CHLORIDE CRYS ER 20 MEQ PO TBCR: 20.0000 meq | EXTENDED_RELEASE_TABLET | Freq: Every day | ORAL | Status: DC

## 2020-08-04 MED ORDER — PHENOL 1.4 % MT LIQD: 1.0000 | OROMUCOSAL | Status: DC | PRN

## 2020-08-04 MED ORDER — POTASSIUM CHLORIDE CRYS ER 20 MEQ PO TBCR: 20.0000 meq | EXTENDED_RELEASE_TABLET | Freq: Every day | ORAL | Status: DC | PRN

## 2020-08-04 MED ORDER — DEXAMETHASONE SODIUM PHOSPHATE 10 MG/ML IJ SOLN
INTRAMUSCULAR | Status: AC
  Filled 2020-08-04: qty 1

## 2020-08-04 MED ORDER — SODIUM CHLORIDE 0.9 % IV SOLN: 500.0000 mL | Freq: Once | INTRAVENOUS | Status: DC | PRN

## 2020-08-04 MED ORDER — LACTATED RINGERS IV SOLN: INTRAVENOUS | Status: DC | PRN

## 2020-08-04 MED ORDER — LIDOCAINE 2% (20 MG/ML) 5 ML SYRINGE
INTRAMUSCULAR | Status: DC | PRN
  Administered 2020-08-04: 50 mg via INTRAVENOUS

## 2020-08-04 MED ORDER — MORPHINE SULFATE (PF) 2 MG/ML IV SOLN
2.0000 mg | INTRAVENOUS | Status: DC | PRN
  Administered 2020-08-04 – 2020-08-05 (×3): 2 mg via INTRAVENOUS
  Filled 2020-08-04 (×3): qty 1

## 2020-08-04 MED ORDER — ATORVASTATIN CALCIUM 40 MG PO TABS
40.0000 mg | ORAL_TABLET | Freq: Every day | ORAL | Status: DC
  Administered 2020-08-04: 40 mg via ORAL
  Filled 2020-08-04: qty 1

## 2020-08-04 MED ORDER — FUROSEMIDE 40 MG PO TABS
40.0000 mg | ORAL_TABLET | Freq: Every day | ORAL | Status: DC
  Administered 2020-08-05 – 2020-08-07 (×3): 40 mg via ORAL
  Filled 2020-08-04 (×3): qty 1

## 2020-08-04 MED ORDER — PHENYLEPHRINE HCL-NACL 10-0.9 MG/250ML-% IV SOLN
INTRAVENOUS | Status: DC | PRN
  Administered 2020-08-04: 40 ug/min via INTRAVENOUS

## 2020-08-04 MED ORDER — FENTANYL CITRATE (PF) 100 MCG/2ML IJ SOLN
INTRAMUSCULAR | Status: AC
  Filled 2020-08-04: qty 2

## 2020-08-04 MED ORDER — 0.9 % SODIUM CHLORIDE (POUR BTL) OPTIME
TOPICAL | Status: DC | PRN
  Administered 2020-08-04: 500 mL
  Administered 2020-08-04 (×2): 1000 mL

## 2020-08-04 MED ORDER — SODIUM CHLORIDE 0.9 % IV SOLN: INTRAVENOUS | Status: DC

## 2020-08-04 MED ORDER — CHLORHEXIDINE GLUCONATE CLOTH 2 % EX PADS: 6.0000 | MEDICATED_PAD | Freq: Once | CUTANEOUS | Status: DC

## 2020-08-04 MED ORDER — BISACODYL 10 MG RE SUPP: 10.0000 mg | Freq: Every day | RECTAL | Status: DC | PRN

## 2020-08-04 MED ORDER — LABETALOL HCL 5 MG/ML IV SOLN: 10.0000 mg | INTRAVENOUS | Status: DC | PRN

## 2020-08-04 MED ORDER — LIDOCAINE 2% (20 MG/ML) 5 ML SYRINGE
INTRAMUSCULAR | Status: AC
  Filled 2020-08-04: qty 5

## 2020-08-04 MED ORDER — SUCCINYLCHOLINE CHLORIDE 200 MG/10ML IV SOSY
PREFILLED_SYRINGE | INTRAVENOUS | Status: AC
  Filled 2020-08-04: qty 10

## 2020-08-04 MED ORDER — MIDAZOLAM HCL 2 MG/2ML IJ SOLN
INTRAMUSCULAR | Status: AC
  Filled 2020-08-04: qty 2

## 2020-08-04 MED ORDER — CEFAZOLIN SODIUM-DEXTROSE 2-4 GM/100ML-% IV SOLN
2.0000 g | INTRAVENOUS | Status: AC
  Administered 2020-08-04 (×2): 2 g via INTRAVENOUS
  Filled 2020-08-04: qty 100

## 2020-08-04 MED ORDER — FENTANYL CITRATE (PF) 250 MCG/5ML IJ SOLN
INTRAMUSCULAR | Status: AC
  Filled 2020-08-04: qty 5

## 2020-08-04 MED ORDER — OXYCODONE-ACETAMINOPHEN 5-325 MG PO TABS
1.0000 | ORAL_TABLET | ORAL | Status: DC | PRN
  Administered 2020-08-04 – 2020-08-06 (×8): 2 via ORAL
  Filled 2020-08-04 (×8): qty 2

## 2020-08-04 MED ORDER — HEPARIN SODIUM (PORCINE) 1000 UNIT/ML IJ SOLN
INTRAMUSCULAR | Status: DC | PRN
  Administered 2020-08-04 (×3): 2000 [IU] via INTRAVENOUS
  Administered 2020-08-04: 10000 [IU] via INTRAVENOUS
  Administered 2020-08-04: 2000 [IU] via INTRAVENOUS

## 2020-08-04 MED ORDER — EPHEDRINE 5 MG/ML INJ
INTRAVENOUS | Status: AC
  Filled 2020-08-04: qty 10

## 2020-08-04 MED ORDER — PROPOFOL 10 MG/ML IV BOLUS
INTRAVENOUS | Status: AC
  Filled 2020-08-04: qty 20

## 2020-08-04 MED ORDER — INSULIN ASPART 100 UNIT/ML ~~LOC~~ SOLN
0.0000 [IU] | Freq: Three times a day (TID) | SUBCUTANEOUS | Status: DC
  Administered 2020-08-05: 3 [IU] via SUBCUTANEOUS
  Administered 2020-08-05: 2 [IU] via SUBCUTANEOUS
  Administered 2020-08-05: 3 [IU] via SUBCUTANEOUS
  Administered 2020-08-06 (×2): 2 [IU] via SUBCUTANEOUS

## 2020-08-04 MED ORDER — POLYETHYLENE GLYCOL 3350 17 G PO PACK: 17.0000 g | PACK | Freq: Every day | ORAL | Status: DC | PRN

## 2020-08-04 MED ORDER — DEXAMETHASONE SODIUM PHOSPHATE 10 MG/ML IJ SOLN
INTRAMUSCULAR | Status: DC | PRN
  Administered 2020-08-04: 5 mg via INTRAVENOUS

## 2020-08-04 MED ORDER — HEPARIN SODIUM (PORCINE) 1000 UNIT/ML IJ SOLN
INTRAMUSCULAR | Status: AC
  Filled 2020-08-04: qty 2

## 2020-08-04 MED ORDER — EPHEDRINE SULFATE-NACL 50-0.9 MG/10ML-% IV SOSY
PREFILLED_SYRINGE | INTRAVENOUS | Status: DC | PRN
  Administered 2020-08-04 (×2): 5 mg via INTRAVENOUS

## 2020-08-04 MED ORDER — SODIUM CHLORIDE 0.9 % IV SOLN
INTRAVENOUS | Status: AC
  Filled 2020-08-04: qty 1.2

## 2020-08-04 MED ORDER — ONDANSETRON HCL 4 MG/2ML IJ SOLN
INTRAMUSCULAR | Status: AC
  Filled 2020-08-04: qty 2

## 2020-08-04 MED ORDER — ROCURONIUM BROMIDE 10 MG/ML (PF) SYRINGE
PREFILLED_SYRINGE | INTRAVENOUS | Status: DC | PRN
  Administered 2020-08-04: 20 mg via INTRAVENOUS
  Administered 2020-08-04: 40 mg via INTRAVENOUS
  Administered 2020-08-04: 10 mg via INTRAVENOUS
  Administered 2020-08-04: 70 mg via INTRAVENOUS
  Administered 2020-08-04: 30 mg via INTRAVENOUS

## 2020-08-04 MED ORDER — ACETAMINOPHEN 650 MG RE SUPP: 325.0000 mg | RECTAL | Status: DC | PRN

## 2020-08-04 MED ORDER — FENTANYL CITRATE (PF) 100 MCG/2ML IJ SOLN
INTRAMUSCULAR | Status: AC
Start: 2020-08-04 — End: 2020-08-05
  Filled 2020-08-04: qty 2

## 2020-08-04 MED ORDER — CARVEDILOL 6.25 MG PO TABS
6.2500 mg | ORAL_TABLET | Freq: Two times a day (BID) | ORAL | Status: DC
  Administered 2020-08-05 – 2020-08-07 (×5): 6.25 mg via ORAL
  Filled 2020-08-04 (×5): qty 1

## 2020-08-04 MED ORDER — HEMOSTATIC AGENTS (NO CHARGE) OPTIME
TOPICAL | Status: DC | PRN
  Administered 2020-08-04 (×2): 1

## 2020-08-04 MED ORDER — OXYCODONE HCL 5 MG PO TABS
5.0000 mg | ORAL_TABLET | Freq: Once | ORAL | Status: AC | PRN
  Administered 2020-08-04: 5 mg via ORAL

## 2020-08-04 MED ORDER — ALUM & MAG HYDROXIDE-SIMETH 200-200-20 MG/5ML PO SUSP: 15.0000 mL | ORAL | Status: DC | PRN

## 2020-08-04 MED ORDER — CHLORHEXIDINE GLUCONATE 0.12 % MT SOLN
15.0000 mL | Freq: Once | OROMUCOSAL | Status: AC
  Administered 2020-08-05: 15 mL via OROMUCOSAL
  Filled 2020-08-04 (×3): qty 15

## 2020-08-04 MED ORDER — FENTANYL CITRATE (PF) 250 MCG/5ML IJ SOLN
INTRAMUSCULAR | Status: DC | PRN
Start: 2020-08-04 — End: 2020-08-04
  Administered 2020-08-04: 50 ug via INTRAVENOUS
  Administered 2020-08-04: 100 ug via INTRAVENOUS
  Administered 2020-08-04: 150 ug via INTRAVENOUS
  Administered 2020-08-04 (×2): 50 ug via INTRAVENOUS

## 2020-08-04 MED ORDER — GUAIFENESIN-DM 100-10 MG/5ML PO SYRP: 15.0000 mL | ORAL_SOLUTION | ORAL | Status: DC | PRN

## 2020-08-04 MED ORDER — SODIUM CHLORIDE 0.9 % IV SOLN
INTRAVENOUS | Status: DC | PRN
  Administered 2020-08-04: 500 mL

## 2020-08-04 MED ORDER — PHENYLEPHRINE 40 MCG/ML (10ML) SYRINGE FOR IV PUSH (FOR BLOOD PRESSURE SUPPORT)
PREFILLED_SYRINGE | INTRAVENOUS | Status: AC
  Filled 2020-08-04: qty 10

## 2020-08-04 MED ORDER — ONDANSETRON HCL 4 MG/2ML IJ SOLN
4.0000 mg | Freq: Four times a day (QID) | INTRAMUSCULAR | Status: DC | PRN
  Administered 2020-08-06: 4 mg via INTRAVENOUS
  Filled 2020-08-04: qty 2

## 2020-08-04 MED ORDER — ASPIRIN EC 81 MG PO TBEC
81.0000 mg | DELAYED_RELEASE_TABLET | Freq: Every day | ORAL | Status: DC
  Administered 2020-08-05 – 2020-08-07 (×3): 81 mg via ORAL
  Filled 2020-08-04 (×3): qty 1

## 2020-08-04 MED ORDER — MAGNESIUM SULFATE 2 GM/50ML IV SOLN: 2.0000 g | Freq: Every day | INTRAVENOUS | Status: DC | PRN

## 2020-08-04 MED ORDER — FENTANYL CITRATE (PF) 100 MCG/2ML IJ SOLN
25.0000 ug | INTRAMUSCULAR | Status: DC | PRN
  Administered 2020-08-04 (×3): 50 ug via INTRAVENOUS

## 2020-08-04 MED ORDER — ROCURONIUM BROMIDE 10 MG/ML (PF) SYRINGE
PREFILLED_SYRINGE | INTRAVENOUS | Status: AC
  Filled 2020-08-04: qty 10

## 2020-08-04 MED ORDER — PANTOPRAZOLE SODIUM 40 MG PO TBEC
40.0000 mg | DELAYED_RELEASE_TABLET | Freq: Every day | ORAL | Status: DC
  Administered 2020-08-04 – 2020-08-07 (×4): 40 mg via ORAL
  Filled 2020-08-04 (×4): qty 1

## 2020-08-04 MED ORDER — LABETALOL HCL 5 MG/ML IV SOLN
INTRAVENOUS | Status: AC
  Filled 2020-08-04: qty 4

## 2020-08-04 MED ORDER — DOCUSATE SODIUM 100 MG PO CAPS
100.0000 mg | ORAL_CAPSULE | Freq: Every day | ORAL | Status: DC
  Administered 2020-08-05 – 2020-08-07 (×3): 100 mg via ORAL
  Filled 2020-08-04 (×3): qty 1

## 2020-08-04 MED ORDER — METOPROLOL TARTRATE 5 MG/5ML IV SOLN: 2.0000 mg | INTRAVENOUS | Status: DC | PRN

## 2020-08-04 MED ORDER — ONDANSETRON HCL 4 MG/2ML IJ SOLN: 4.0000 mg | Freq: Four times a day (QID) | INTRAMUSCULAR | Status: DC | PRN

## 2020-08-04 MED ORDER — OXYCODONE HCL 5 MG/5ML PO SOLN: 5.0000 mg | Freq: Once | ORAL | Status: AC | PRN

## 2020-08-04 MED ORDER — ONDANSETRON HCL 4 MG/2ML IJ SOLN
INTRAMUSCULAR | Status: DC | PRN
  Administered 2020-08-04: 4 mg via INTRAVENOUS

## 2020-08-04 MED ORDER — IODIXANOL 320 MG/ML IV SOLN
INTRAVENOUS | Status: DC | PRN
  Administered 2020-08-04: 15 mL

## 2020-08-04 MED ORDER — PROTAMINE SULFATE 10 MG/ML IV SOLN
INTRAVENOUS | Status: DC | PRN
  Administered 2020-08-04: 30 mg via INTRAVENOUS
  Administered 2020-08-04: 20 mg via INTRAVENOUS

## 2020-08-04 MED ORDER — ACETAMINOPHEN 325 MG PO TABS
325.0000 mg | ORAL_TABLET | ORAL | Status: DC | PRN
  Administered 2020-08-07: 650 mg via ORAL
  Filled 2020-08-04: qty 2

## 2020-08-04 MED ORDER — OXYCODONE HCL 5 MG PO TABS
ORAL_TABLET | ORAL | Status: AC
Start: 2020-08-04 — End: 2020-08-05
  Filled 2020-08-04: qty 1

## 2020-08-04 MED ORDER — HYDRALAZINE HCL 20 MG/ML IJ SOLN: 5.0000 mg | INTRAMUSCULAR | Status: DC | PRN

## 2020-08-04 MED ORDER — CEFAZOLIN SODIUM-DEXTROSE 2-4 GM/100ML-% IV SOLN
2.0000 g | Freq: Three times a day (TID) | INTRAVENOUS | Status: AC
  Administered 2020-08-05 – 2020-08-06 (×6): 2 g via INTRAVENOUS
  Filled 2020-08-04 (×6): qty 100

## 2020-08-04 MED ORDER — PHENYLEPHRINE 40 MCG/ML (10ML) SYRINGE FOR IV PUSH (FOR BLOOD PRESSURE SUPPORT)
PREFILLED_SYRINGE | INTRAVENOUS | Status: DC | PRN
  Administered 2020-08-04: 80 ug via INTRAVENOUS
  Administered 2020-08-04: 40 ug via INTRAVENOUS

## 2020-08-04 MED ORDER — GLIPIZIDE 10 MG PO TABS
10.0000 mg | ORAL_TABLET | Freq: Two times a day (BID) | ORAL | Status: DC
  Administered 2020-08-05 – 2020-08-07 (×5): 10 mg via ORAL
  Filled 2020-08-04 (×5): qty 1

## 2020-08-04 MED ORDER — PROPOFOL 10 MG/ML IV BOLUS
INTRAVENOUS | Status: DC | PRN
  Administered 2020-08-04: 170 mg via INTRAVENOUS

## 2020-08-04 MED ORDER — PROTAMINE SULFATE 10 MG/ML IV SOLN
INTRAVENOUS | Status: AC
  Filled 2020-08-04: qty 5

## 2020-08-04 MED ORDER — SUGAMMADEX SODIUM 200 MG/2ML IV SOLN
INTRAVENOUS | Status: DC | PRN
  Administered 2020-08-04: 300 mg via INTRAVENOUS

## 2020-08-04 SURGICAL SUPPLY — 128 items
ADH SKN CLS APL DERMABOND .7 (GAUZE/BANDAGES/DRESSINGS) ×4
AGENT HMST SPONGE THK3/8 (HEMOSTASIS)
APL PRP STRL LF DISP 70% ISPRP (MISCELLANEOUS) ×4
BAG BANDED W/RUBBER/TAPE 36X54 (MISCELLANEOUS) ×6 IMPLANT
BAG EQP BAND 135X91 W/RBR TAPE (MISCELLANEOUS) ×4
BAG SNAP BAND KOVER 36X36 (MISCELLANEOUS) IMPLANT
BALLN IN.PACT DCB 6X40 (BALLOONS) ×6
BANDAGE ESMARK 6X9 LF (GAUZE/BANDAGES/DRESSINGS) IMPLANT
BLADE CLIPPER SURG (BLADE) ×6 IMPLANT
BLADE SURG 15 STRL LF DISP TIS (BLADE) IMPLANT
BLADE SURG 15 STRL SS (BLADE) ×6
BNDG CMPR 9X6 STRL LF SNTH (GAUZE/BANDAGES/DRESSINGS)
BNDG ELASTIC 4X5.8 VLCR STR LF (GAUZE/BANDAGES/DRESSINGS) ×6 IMPLANT
BNDG ESMARK 6X9 LF (GAUZE/BANDAGES/DRESSINGS)
BNDG GAUZE ELAST 4 BULKY (GAUZE/BANDAGES/DRESSINGS) ×8 IMPLANT
CANISTER SUCT 3000ML PPV (MISCELLANEOUS) ×6 IMPLANT
CANISTER WOUNDNEG PRESSURE 500 (CANNISTER) ×2 IMPLANT
CATH ANGIO 5F BER2 65CM (CATHETERS) IMPLANT
CATH BEACON 5 .035 65 KMP TIP (CATHETERS) ×2 IMPLANT
CATH EMB 3FR 80CM (CATHETERS) ×2 IMPLANT
CATH EMB 4FR 80CM (CATHETERS) ×8 IMPLANT
CATH OMNI FLUSH 5F 65CM (CATHETERS) ×6 IMPLANT
CATH POLY DUAL LUMEN OCCL 5FR (CATHETERS) ×4 IMPLANT
CHLORAPREP W/TINT 26 (MISCELLANEOUS) ×6 IMPLANT
CLIP VESOCCLUDE MED 24/CT (CLIP) ×6 IMPLANT
CLIP VESOCCLUDE SM WIDE 24/CT (CLIP) ×6 IMPLANT
COVER BACK TABLE 60X90IN (DRAPES) ×2 IMPLANT
COVER DOME SNAP 22 D (MISCELLANEOUS) ×6 IMPLANT
COVER PROBE W GEL 5X96 (DRAPES) ×6 IMPLANT
COVER SURGICAL LIGHT HANDLE (MISCELLANEOUS) ×6 IMPLANT
COVER WAND RF STERILE (DRAPES) ×6 IMPLANT
CUFF TOURN SGL QUICK 18X4 (TOURNIQUET CUFF) IMPLANT
CUFF TOURN SGL QUICK 24 (TOURNIQUET CUFF)
CUFF TOURN SGL QUICK 34 (TOURNIQUET CUFF)
CUFF TOURN SGL QUICK 42 (TOURNIQUET CUFF) IMPLANT
CUFF TRNQT CYL 24X4X16.5-23 (TOURNIQUET CUFF) IMPLANT
CUFF TRNQT CYL 34X4.125X (TOURNIQUET CUFF) IMPLANT
DCB IN.PACT 6X40 (BALLOONS) IMPLANT
DERMABOND ADVANCED (GAUZE/BANDAGES/DRESSINGS) ×2
DERMABOND ADVANCED .7 DNX12 (GAUZE/BANDAGES/DRESSINGS) ×4 IMPLANT
DEVICE TORQUE KENDALL .025-038 (MISCELLANEOUS) ×2 IMPLANT
DRAIN CHANNEL 15F RND FF W/TCR (WOUND CARE) IMPLANT
DRAPE C-ARM 42X72 X-RAY (DRAPES) ×6 IMPLANT
DRAPE EXTREMITY T 121X128X90 (DISPOSABLE) ×6 IMPLANT
DRAPE FEMORAL ANGIO 80X135IN (DRAPES) ×6 IMPLANT
DRAPE HALF SHEET 40X57 (DRAPES) ×6 IMPLANT
DRSG ADAPTIC 3X8 NADH LF (GAUZE/BANDAGES/DRESSINGS) ×4 IMPLANT
DRSG EMULSION OIL 3X3 NADH (GAUZE/BANDAGES/DRESSINGS) ×8 IMPLANT
DRSG VAC ATS SM SENSATRAC (GAUZE/BANDAGES/DRESSINGS) ×2 IMPLANT
ELECT REM PT RETURN 9FT ADLT (ELECTROSURGICAL) ×6
ELECTRODE REM PT RTRN 9FT ADLT (ELECTROSURGICAL) ×4 IMPLANT
EVACUATOR SILICONE 100CC (DRAIN) IMPLANT
GAUZE 4X4 16PLY RFD (DISPOSABLE) ×6 IMPLANT
GAUZE SPONGE 4X4 12PLY STRL (GAUZE/BANDAGES/DRESSINGS) ×8 IMPLANT
GLIDEWIRE ADV .035X180CM (WIRE) ×2 IMPLANT
GLOVE BIO SURGEON STRL SZ 6.5 (GLOVE) ×3 IMPLANT
GLOVE BIO SURGEON STRL SZ7.5 (GLOVE) ×14 IMPLANT
GLOVE BIO SURGEONS STRL SZ 6.5 (GLOVE) ×3
GLOVE BIOGEL PI IND STRL 6.5 (GLOVE) IMPLANT
GLOVE BIOGEL PI IND STRL 8 (GLOVE) ×4 IMPLANT
GLOVE BIOGEL PI INDICATOR 6.5 (GLOVE) ×4
GLOVE BIOGEL PI INDICATOR 8 (GLOVE) ×8
GLOVE INDICATOR 7.5 STRL GRN (GLOVE) ×2 IMPLANT
GLOVE SS BIOGEL STRL SZ 6.5 (GLOVE) IMPLANT
GLOVE SUPERSENSE BIOGEL SZ 6.5 (GLOVE) ×2
GLOVE SURG SS PI 6.5 STRL IVOR (GLOVE) ×4 IMPLANT
GOWN STRL REUS W/ TWL LRG LVL3 (GOWN DISPOSABLE) ×12 IMPLANT
GOWN STRL REUS W/ TWL XL LVL3 (GOWN DISPOSABLE) ×8 IMPLANT
GOWN STRL REUS W/TWL LRG LVL3 (GOWN DISPOSABLE) ×18
GOWN STRL REUS W/TWL XL LVL3 (GOWN DISPOSABLE) ×24
GUIDEWIRE ANGLED .035X150CM (WIRE) IMPLANT
HEMOSTAT ARISTA ABSORB 3G PWDR (HEMOSTASIS) ×2 IMPLANT
HEMOSTAT SPONGE AVITENE ULTRA (HEMOSTASIS) IMPLANT
INSERT FOGARTY SM (MISCELLANEOUS) IMPLANT
KIT BASIN OR (CUSTOM PROCEDURE TRAY) ×6 IMPLANT
KIT ENCORE 26 ADVANTAGE (KITS) ×2 IMPLANT
KIT TURNOVER KIT B (KITS) ×6 IMPLANT
NDL PERC 18GX7CM (NEEDLE) ×4 IMPLANT
NEEDLE PERC 18GX7CM (NEEDLE) ×6 IMPLANT
NS IRRIG 1000ML POUR BTL (IV SOLUTION) ×12 IMPLANT
PACK ENDO MINOR (CUSTOM PROCEDURE TRAY) ×6 IMPLANT
PACK GENERAL/GYN (CUSTOM PROCEDURE TRAY) ×6 IMPLANT
PACK PERIPHERAL VASCULAR (CUSTOM PROCEDURE TRAY) ×6 IMPLANT
PACK SURGICAL SETUP 50X90 (CUSTOM PROCEDURE TRAY) ×6 IMPLANT
PAD ARMBOARD 7.5X6 YLW CONV (MISCELLANEOUS) ×12 IMPLANT
PATCH VASC XENOSURE 1CMX6CM (Vascular Products) ×6 IMPLANT
PATCH VASC XENOSURE 1X6 (Vascular Products) IMPLANT
PROTECTION STATION PRESSURIZED (MISCELLANEOUS) ×6
SET MICROPUNCTURE 5F STIFF (MISCELLANEOUS) ×6 IMPLANT
SHEATH AVANTI 11CM 5FR (SHEATH) ×6 IMPLANT
SHEATH PINNACLE 5F 10CM (SHEATH) ×6 IMPLANT
SHEATH PINNACLE 6F 10CM (SHEATH) ×2 IMPLANT
SPONGE LAP 18X18 X RAY DECT (DISPOSABLE) ×2 IMPLANT
STAPLER VISISTAT 35W (STAPLE) IMPLANT
STATION PROTECTION PRESSURIZED (MISCELLANEOUS) ×4 IMPLANT
STOPCOCK 4 WAY LG BORE MALE ST (IV SETS) ×8 IMPLANT
STOPCOCK MORSE 400PSI 3WAY (MISCELLANEOUS) IMPLANT
SUT ETHILON 2 0 PSLX (SUTURE) ×2 IMPLANT
SUT ETHILON 3 0 PS 1 (SUTURE) ×10 IMPLANT
SUT GORETEX 5 0 TT13 24 (SUTURE) IMPLANT
SUT GORETEX 6.0 TT13 (SUTURE) IMPLANT
SUT MNCRL AB 4-0 PS2 18 (SUTURE) ×18 IMPLANT
SUT PROLENE 5 0 C 1 24 (SUTURE) ×6 IMPLANT
SUT PROLENE 6 0 BV (SUTURE) ×28 IMPLANT
SUT PROLENE 7 0 BV 1 (SUTURE) IMPLANT
SUT SILK 2 0 PERMA HAND 18 BK (SUTURE) IMPLANT
SUT SILK 2 0 SH (SUTURE) ×6 IMPLANT
SUT SILK 3 0 (SUTURE) ×6
SUT SILK 3 0 SH CR/8 (SUTURE) ×2 IMPLANT
SUT SILK 3-0 18XBRD TIE 12 (SUTURE) IMPLANT
SUT VIC AB 2-0 CT1 27 (SUTURE) ×12
SUT VIC AB 2-0 CT1 TAPERPNT 27 (SUTURE) ×8 IMPLANT
SUT VIC AB 3-0 SH 27 (SUTURE) ×18
SUT VIC AB 3-0 SH 27X BRD (SUTURE) ×12 IMPLANT
SYR 10ML LL (SYRINGE) ×20 IMPLANT
SYR 20ML LL LF (SYRINGE) ×6 IMPLANT
SYR 3ML LL SCALE MARK (SYRINGE) ×4 IMPLANT
SYR BULB EAR ULCER 3OZ GRN STR (SYRINGE) ×2 IMPLANT
SYR MEDRAD MARK V 150ML (SYRINGE) IMPLANT
TOWEL GREEN STERILE (TOWEL DISPOSABLE) ×12 IMPLANT
TRAY FOLEY MTR SLVR 16FR STAT (SET/KITS/TRAYS/PACK) ×6 IMPLANT
TUBING CIL FLEX 10 FLL-RA (TUBING) ×2 IMPLANT
TUBING EXTENTION W/L.L. (IV SETS) ×6 IMPLANT
TUBING HIGH PRESSURE 120CM (CONNECTOR) IMPLANT
UNDERPAD 30X36 HEAVY ABSORB (UNDERPADS AND DIAPERS) ×6 IMPLANT
WATER STERILE IRR 1000ML POUR (IV SOLUTION) ×6 IMPLANT
WIRE BENTSON .035X145CM (WIRE) ×6 IMPLANT
WIRE ROSEN-J .035X260CM (WIRE) ×2 IMPLANT

## 2020-08-04 NOTE — Anesthesia Procedure Notes (Signed)
Procedure Name: Intubation Date/Time: 08/04/2020 12:26 PM Performed by: Reece Agar, CRNA Pre-anesthesia Checklist: Patient identified, Emergency Drugs available, Suction available and Patient being monitored Patient Re-evaluated:Patient Re-evaluated prior to induction Oxygen Delivery Method: Circle System Utilized Preoxygenation: Pre-oxygenation with 100% oxygen Induction Type: IV induction Ventilation: Mask ventilation without difficulty Laryngoscope Size: Mac and 4 Grade View: Grade II Tube type: Oral Tube size: 7.5 mm Number of attempts: 1 Airway Equipment and Method: Stylet and Oral airway Placement Confirmation: ETT inserted through vocal cords under direct vision,  positive ETCO2 and breath sounds checked- equal and bilateral Secured at: 22 cm Tube secured with: Tape Dental Injury: Teeth and Oropharynx as per pre-operative assessment

## 2020-08-04 NOTE — Anesthesia Preprocedure Evaluation (Signed)
Anesthesia Evaluation  Patient identified by MRN, date of birth, ID band Patient awake    Reviewed: Allergy & Precautions, H&P , NPO status , Patient's Chart, lab work & pertinent test results  Airway Mallampati: II   Neck ROM: full    Dental   Pulmonary COPD, Patient abstained from smoking., former smoker,    breath sounds clear to auscultation       Cardiovascular + CAD, + Past MI, + CABG, + Peripheral Vascular Disease and +CHF   Rhythm:regular Rate:Normal     Neuro/Psych    GI/Hepatic GERD  ,  Endo/Other  diabetes, Type 2  Renal/GU      Musculoskeletal  (+) Arthritis ,   Abdominal   Peds  Hematology   Anesthesia Other Findings   Reproductive/Obstetrics                             Anesthesia Physical Anesthesia Plan  ASA: III  Anesthesia Plan: General   Post-op Pain Management:    Induction: Intravenous  PONV Risk Score and Plan: 2 and Ondansetron, Dexamethasone, Midazolam and Treatment may vary due to age or medical condition  Airway Management Planned: Oral ETT  Additional Equipment: Arterial line  Intra-op Plan:   Post-operative Plan: Extubation in OR  Informed Consent: I have reviewed the patients History and Physical, chart, labs and discussed the procedure including the risks, benefits and alternatives for the proposed anesthesia with the patient or authorized representative who has indicated his/her understanding and acceptance.       Plan Discussed with: CRNA, Anesthesiologist and Surgeon  Anesthesia Plan Comments:         Anesthesia Quick Evaluation

## 2020-08-04 NOTE — H&P (Signed)
History and Physical Interval Note:  08/04/2020 11:39 AM  Robert Sanchez  has presented today for surgery, with the diagnosis of LEFT COMMON FEMORAL ARTERY OCCLUSION, GANGRENE OF LEFT TOE.  The various methods of treatment have been discussed with the patient and family. After consideration of risks, benefits and other options for treatment, the patient has consented to  Procedure(s): THROMBECTOMY (Left) FEMORAL-DISTAL BYPASS (Left) LEFT ILIAC ANGIOGRAM (Left) INSERTION OF ILIAC STENT (Left) LEFT GREAT TOE AMPUTATION (Left) as a surgical intervention.  The patient's history has been reviewed, patient examined, no change in status, stable for surgery.  I have reviewed the patient's chart and labs.  Questions were answered to the patient's satisfaction.    Very complex history.  Left fem PT composite bypass now occluded.  Likely plan to do thrombectomy of bypass and try and jump it to BK pop/TP trunk after patch and also evaluate left iliac stent.  He realizes options are more limited given multiple failures of previous bypass and high risk for limb loss.  [facility] Consult     Reason for Consult: Pain in left great toe with occluded composite bypass in left leg Referring Physician: ED MRN #:  [id number]  History of Present Illness: This is a 64 y.o. male with history of diabetes, coronary artery disease, peripheral vascular disease, tobacco abuse that presents for evaluation of swelling and pain in his left great toe.  States this has been going on for several weeks since discharge from the hospital.  CT shows occluded left CFA to PT bypass in left leg.    He has a somewhat complex history in the left leg.  He initially underwent left common femoral to PT bypass with reversed saphenous vein in 2019 for CLI with rest pain.  Ultimately he presented to the hospital earlier this year in March 2021 with an occluded bypass and underwent thrombolysis and subsequent  percutaneous mechanical thrombectomy with angioplasty of the bypass and PT on 01/16/20- 01/17/2020. Ultimately his bypass was salvaged and he was discharged on Eliquis. During this event he did develop necrotic left great toe that we have been watching. He then recently presented again and his bypass had occluded a second time in September 2021 and underwent second attempt thrombolysis that was unsuccessful.  Ultimately during an attempted thrombolysis we took him back he developed thrombus in the common femoral and iliac and required thrombectomy and ultimately we performed a left common femoral to PT composite bypass using the distal vein on the PT since this appeared to be healthy.  CT tonight confirms this is now thrombosed.  He has been taking his Coumadin for high risk bypass.  INR was greater than 7 earlier in the week and he was told to hold his Coumadin by coumadin clinic.  Can move his foot fine.       Past Medical History:  Diagnosis Date  . Acute pulmonary edema (HCC) 06/05/2018  . Arthritis   . COPD (chronic obstructive pulmonary disease) (HCC)    " beginning stages " per pt  . Coronary artery disease   . Diabetes mellitus without complication (HCC)   . Diabetic neuropathy (HCC)   . Diabetic neuropathy (HCC)   . Emphysema/COPD (HCC)    " beginning stages"  . GERD (gastroesophageal reflux disease)   . HOH (hard of hearing)   . Myocardial infarction (HCC)   . Peripheral vascular disease [facility])          Past Surgical History:  Procedure Laterality Date  . ABDOMINAL AORTOGRAM W/LOWER EXTREMITY N/A 06/21/2018   Procedure: ABDOMINAL AORTOGRAM W/LOWER EXTREMITY;  Surgeon: Cephus Shelling, MD;  [facility]: [facility] INV[facility] CV [facility];  Service: Cardiovascular;  Laterality: N/A;  . ABDOMINAL AORTOGRAM W/LOWER EXTREMITY Left 01/16/2020   Procedure: ABDOMINAL AORTOGRAM W/LOWER EXTREMITY;  Surgeon: Cephus Shelling, MD;  Location: [facility];  Service:  Cardiovascular;  Laterality: Left;  . ABDOMINAL AORTOGRAM W/LOWER EXTREMITY N/A 07/09/2020   Procedure: ABDOMINAL AORTOGRAM W/LOWER EXTREMITY;  Surgeon: Cephus Shelling, MD;  Location: [facility];  Service: Cardiovascular;  Laterality: N/A;  TPA infusion  . CORONARY ARTERY BYPASS GRAFT N/A 06/12/2018   Procedure: CORONARY ARTERY BYPASS GRAFTING (CABG) x 3 WITH ENDOSCOPIC HARVESTING OF RIGHT GREATER SAPHENOUS VEIN. LIMA TO LAD. SVG TO PD. SVG TO DIAGONAL.;  Surgeon: Kerin Perna, MD;  Location: [facility] OR;  Service: Open Heart Surgery;  Laterality: N/A;  . FEMORAL-POPLITEAL BYPASS GRAFT Left 07/10/2020   Procedure: REDO EXPOSURE OF LEFT COMMON FEMORAL ARTERY LEFT COMMON FEMORAL TO POSTERIOR TIBIAL COMPOSITE BYPASS GRAFT;  Surgeon: Cephus Shelling, MD;  Location: [facility] OR;  Service: Vascular;  Laterality: Left;  . FEMORAL-TIBIAL BYPASS GRAFT Left 08/21/2018   Procedure: BYPASS GRAFT LEFT FEMORAL TO POSTERIOR TIBIAL ARTERY USING LEFT REVERSED GREAT SAPHENOUS VEIN;  Surgeon: Cephus Shelling, MD;  Location: [facility] OR;  Service: Vascular;  Laterality: Left;  . KNEE ARTHROSCOPY    . LOWER EXTREMITY ANGIOGRAPHY Left 01/17/2020   Procedure: Lower Extremity Angiography;  Surgeon: Cephus Shelling, MD;  Location: [facility] INVASIVE CV LAB;  Service: Cardiovascular;  Laterality: Left;  . PERIPHERAL VASCULAR BALLOON ANGIOPLASTY Left 01/17/2020   Procedure: PERIPHERAL VASCULAR BALLOON ANGIOPLASTY;  Surgeon: Cephus Shelling, MD;  [facility]: [facility];  Service: Cardiovascular;  Laterality: Left;  pt  . PERIPHERAL VASCULAR INTERVENTION Left 06/21/2018   Procedure: PERIPHERAL VASCULAR INTERVENTION;  Surgeon: Cephus Shelling, MD;  Location: [facility] INVASIVE CV LAB;  Service: Cardiovascular;  Laterality: Left;  . PERIPHERAL VASCULAR THROMBECTOMY Left 01/16/2020   Procedure: PERIPHERAL VASCULAR THROMBECTOMY;  Surgeon: Cephus Shelling, MD;  [facility]: [facility];  Service:  Cardiovascular;  Laterality: Left;  Lytic Catheter Placement left fem-pop bypass  . PERIPHERAL VASCULAR THROMBECTOMY Left 01/17/2020   Procedure: PERIPHERAL VASCULAR THROMBECTOMY;  Surgeon: Cephus Shelling, MD;  Location: [facility];  Service: Cardiovascular;  Laterality: Left;  fem-pt bypass  . PERIPHERAL VASCULAR THROMBECTOMY Left 07/10/2020   Procedure: lysis recheck;  Surgeon: Cephus Shelling, MD;  Location: [facility] INVASIVE CV LAB;  Service: Cardiovascular;  Laterality: Left;  . RIGHT/LEFT HEART CATH AND CORONARY ANGIOGRAPHY N/A 06/05/2018   Procedure: RIGHT/LEFT HEART CATH AND CORONARY ANGIOGRAPHY;  Surgeon: Orpah Cobb, MD;  [facility]: [facility];  Service: Cardiovascular;  Laterality: N/A;  . SPINE SURGERY    . TEE WITHOUT CARDIOVERSION N/A 06/12/2018   Procedure: TRANSESOPHAGEAL ECHOCARDIOGRAM (TEE);  Surgeon: Donata Clay, Theron Arista, MD;  Location: [facility] OR;  Service: Open Heart Surgery;  Laterality: N/A;  . teeth extractions    . THROMBECTOMY ILIAC ARTERY Left 07/10/2020   Procedure: THROMBECTOMY OF LEFT EXTERNAL ILIAC AND LEFT COMMON FEMORAL AND LEFT POSTERIOR TIBIAL ARTERIES;  Surgeon: Cephus Shelling, MD;  Location: [facility] OR;  Service: Vascular;  Laterality: Left;  Marland Kitchen VEIN HARVEST Left 08/21/2018   Procedure: VEIN HARVEST LEFT GREAT SAPHENOUS VEIN;  Surgeon: Cephus Shelling, MD;  Location: [facility] OR;  Service: Vascular;  Laterality: Left;  Allergies  Allergen Reactions  . Lisinopril Other (See Comments)    Syncope  . Codeine Rash           Prior to Admission medications   Medication Sig Start Date End Date Taking? Authorizing Provider  aspirin EC 81 MG EC tablet Take 1 tablet (81 mg total) by mouth daily at 6 (six) AM. Swallow whole. 07/12/20   Lars Mage, PA-C  atorvastatin (LIPITOR) 40 MG tablet Take 1 tablet (40 mg total) by mouth at bedtime. 06/27/20   Janeece Agee, NP  carvedilol (COREG) 6.25 MG tablet Take 1 tablet (6.25 mg  total) by mouth 2 (two) times daily with a meal. 06/27/20   Janeece Agee, NP  furosemide (LASIX) 40 MG tablet Take 1 tablet (40 mg total) by mouth daily. 06/27/20   Janeece Agee, NP  glipiZIDE (GLUCOTROL) 10 MG tablet Take 1 tablet (10 mg total) by mouth 2 (two) times daily before a meal. 06/27/20   Janeece Agee, NP  metFORMIN (GLUCOPHAGE) 1000 MG tablet Take 1 tablet (1,000 mg total) by mouth 2 (two) times daily with a meal. 06/27/20   Janeece Agee, NP  oxyCODONE-acetaminophen (PERCOCET/ROXICET) 5-325 MG tablet Take 1 tablet by mouth every 6 (six) hours as needed for moderate pain or severe pain. 07/12/20   Lars Mage, PA-C  potassium chloride SA (KLOR-CON) 20 MEQ tablet Take 1 tablet (20 mEq total) by mouth daily. 06/27/20   Janeece Agee, NP  warfarin (COUMADIN) 5 MG tablet Take 1 tablet (5 mg total) by mouth daily. 07/14/20   Lars Mage, PA-C    Social History        Socioeconomic History  . Marital status: Single    Spouse name: Not on file  . Number of children: Not on file  . Years of education: Not on file  . Highest education level: Not on file  Occupational History  . Not on file  Tobacco Use  . Smoking status: Former Smoker    Packs/day: 1.00    Years: 40.00    Pack years: 40.00    Quit date: 06/02/2018    Years since quitting: 2.1  . Smokeless tobacco: Never Used  Vaping Use  . Vaping Use: Never used  Substance and Sexual Activity  . Alcohol use: Not Currently    Comment: quit  . Drug use: Not Currently  . Sexual activity: Not on file  Other Topics Concern  . Not on file  Social History Narrative  . Not on file   Social Determinants of Health      Financial Resource Strain:   . Difficulty of Paying Living Expenses: Not on file  Food Insecurity:   . Worried About Programme researcher, broadcasting/film/video in the Last Year: Not on file  . Ran Out of Food in the Last Year: Not on file  Transportation Needs:   . Lack of Transportation  (Medical): Not on file  . Lack of Transportation (Non-Medical): Not on file  Physical Activity:   . Days of Exercise per Week: Not on file  . Minutes of Exercise per Session: Not on file  Stress:   . Feeling of Stress : Not on file  Social Connections:   . Frequency of Communication with Friends and Family: Not on file  . Frequency of Social Gatherings with Friends and Family: Not on file  . Attends Religious Services: Not on file  . Active Member of Clubs or Organizations: Not on file  . Attends Club or  Organization Meetings: Not on file  . Marital Status: Not on file  Intimate Partner Violence:   . Fear of Current or Ex-Partner: Not on file  . Emotionally Abused: Not on file  . Physically Abused: Not on file  . Sexually Abused: Not on file          Family History  Problem Relation Age of Onset  . Diabetes Mother   . Lung disease Mother   . Cancer Father   . Heart disease Father     ROS: [x]  Positive   [ ]  Negative   [ ]  All sytems reviewed and are negative  Cardiovascular: []  chest pain/pressure []  palpitations []  SOB lying flat []  DOE []  pain in legs while walking []  pain in legs at rest []  pain in legs at night []  non-healing ulcers []  hx of DVT []  swelling in legs  Pulmonary: []  productive cough []  asthma/wheezing []  home O2  Neurologic: []  weakness in []  arms []  legs []  numbness in []  arms []  legs []  hx of CVA []  mini stroke [] difficulty speaking or slurred speech []  temporary loss of vision in one eye []  dizziness  Hematologic: []  hx of cancer []  bleeding problems []  problems with blood clotting easily  Endocrine:   []  diabetes []  thyroid disease  GI []  vomiting blood []  blood in stool  GU: []  CKD/renal failure []  HD--[]  M/W/F or []  T/T/S []  burning with urination []  blood in urine  Psychiatric: []  anxiety []  depression  Musculoskeletal: []  arthritis []  joint pain  Integumentary: []  rashes []   ulcers  Constitutional: []  fever []  chills   Physical Examination      Vitals:   07/31/20 1651 07/31/20 1830  BP: 139/69 137/62  Pulse: 88 82  Resp: (!) 21 15  Temp: 98.5 F (36.9 C)   SpO2: 100%    There is no height or weight on file to calculate BMI.  General:  WDWN in NAD Gait: Not observed HENT: WNL, normocephalic Pulmonary: normal non-labored breathing, without Rales, rhonchi,  wheezing Cardiac: regular, without  Murmurs, rubs or gallops Abdomen: soft, NT/ND, no masses Vascular Exam/Pulses: Left groin incision macerated but no active infection Left femoral pulse palpable Left DP/PT/Peroneal monophasic signals Extremities: Dry gangrene left great toe, able to wiggle toes, states chronic stable numbness in left foot Musculoskeletal: no muscle wasting or atrophy       Neurologic: A&O X 3; Appropriate Affect ; SENSATION: normal; MOTOR FUNCTION:  moving all extremities equally. Speech is fluent/normal     CBC Labs (Brief)     Component Value Date/Time   WBC 14.1 (H) 07/31/2020 1139   RBC 3.80 (L) 07/31/2020 1139   HGB 11.2 (L) 07/31/2020 1139   HCT 34.4 (L) 07/31/2020 1139   PLT 479 (H) 07/31/2020 1139   MCV 90.5 07/31/2020 1139   MCH 29.5 07/31/2020 1139   MCHC 32.6 07/31/2020 1139   RDW 14.1 07/31/2020 1139   LYMPHSABS 1.5 01/15/2020 0750   MONOABS 1.2 (H) 01/15/2020 0750   EOSABS 0.2 01/15/2020 0750   BASOSABS 0.1 01/15/2020 0750      BMET Labs (Brief)          Component Value Date/Time   NA 134 (L) 07/31/2020 1139   NA 137 06/27/2020 1216   K 4.0 07/31/2020 1139   CL 99 07/31/2020 1139   CO2 22 07/31/2020 1139   GLUCOSE 170 (H) 07/31/2020 1139   BUN 16 07/31/2020 1139   BUN 17 06/27/2020 1216   CREATININE  1.11 07/31/2020 1139   CREATININE 0.81 08/14/2014 1126   CALCIUM 9.2 07/31/2020 1139   GFRNONAA >60 07/31/2020 1139   GFRNONAA >89 08/14/2014 1126   GFRAA >60 07/31/2020 1139   GFRAA >89  08/14/2014 1126      COAGS: Recent Labs       Lab Results  Component Value Date   INR 2.3 (H) 07/31/2020   INR 7.7 (A) 07/28/2020   INR 5.8 (A) 07/16/2020       Non-Invasive Vascular Imaging:    I reviewed his CTA abdomen pelvis and agree he has a left common femoral to PT composite bypass that is occluded.    ASSESSMENT/PLAN: This is a 64 y.o. male presents with occluded left common femoral to PT composite bypass that was recently performed on 07/10/2020 in the setting of attempted limb salvage given a previous left common femoral to PT bypass with vein in 2019 that has failed multiple times this year.  Ultimately discussed that without revascularization he is going to be at exceedingly high risk for limb loss and I have no expectation his toe will heal.  No active signs of infection at this time and has had this toe wound since March of this year and dry gangrene at the tip.  I did offer an opportunity for admission tonight if he feels his pain is uncontrolled and we can hydrate him and plan for surgery on Monday.  Ultimately he is not interested in admission and wants to be discharged home.  We will get his Covid test tonight and then I will have him self isolate.  I will put him on the OR schedule for Monday for a re-do left femoral to distal bypass which may include thrombectomizing his composite bypass proximally and using this to perform jump graft to a new target like the TP trunk based on his most recent arteriogram from recent lysis attemp.  I have asked that he stop his Coumadin to let his INR drift down.  We will put him on some oral antibiotics over the weekend.  He realizes he is at exceedingly high risk for limb loss even with surgery given more limited options.  No vein in right leg, left leg vein harvested.  Discussed would use prosthetic and risk for infection, bypass failure, risk of anesthesia, bleeding, etc.  Cephus Shellinghristopher J. Zakiya Sporrer, MD [facility] Office: [phone]

## 2020-08-04 NOTE — Anesthesia Procedure Notes (Signed)
Arterial Line Insertion Start/End10/01/2020 12:30 PM, 08/04/2020 12:40 PM Performed by: Achille Rich, MD, anesthesiologist  Preanesthetic checklist: patient identified, IV checked, site marked, risks and benefits discussed, surgical consent, monitors and equipment checked, pre-op evaluation, timeout performed and anesthesia consent Patient sedated Right, radial was placed Catheter size: 20 G Hand hygiene performed , maximum sterile barriers used  and Seldinger technique used Allen's test indicative of satisfactory collateral circulation Attempts: 2 Procedure performed using ultrasound guided technique. Ultrasound Notes:anatomy identified, needle tip was noted to be adjacent to the nerve/plexus identified and no ultrasound evidence of intravascular and/or intraneural injection Following insertion, dressing applied and Biopatch. Post procedure assessment: normal  Patient tolerated the procedure well with no immediate complications.

## 2020-08-04 NOTE — Op Note (Addendum)
Date: August 04, 2020  Preoperative diagnosis: Critical limb ischemia of the left lower extremity with tissue loss and occluded left common femoral to posterior tibial artery composite bypass  Postoperative diagnosis: Same  Procedure: 1.  Thrombectomy of left common femoral to PT composite bypass 2.  Retrograde left iliac arteriogram with drug-coated balloon angioplasty of left external iliac artery stent (6 mm x 40 mm drug-coated impact) 3.  Left below-knee popliteal artery and TP trunk bovine pericardial patch angioplasty 4.  Left common femoral to below knee popliteal artery bypass with PTFE onto the below-knee popliteal artery bovine pericardial patch 5.  Debridement of left groin with placement of negative pressure wound VAC 6.  Partial left great toe amputation  Surgeon: Dr. Cephus Shelling, MD  Assistant: Wendi Maya, PA  Indications: Patient is a 64 year old male who previously underwent a left common femoral to PT bypass with ipsilateral reversed great saphenous vein in 2019.  This subsequently occluded in March and he underwent thrombolysis with salvage of the bypass.  Then he had subsequent occlusion of his bypass again in early September with an attempted thrombolysis that failed and underwent composite bypass bypass with PTFE in his distal vein graft onto the PT artery.  He presents today for attempt at limb salvage after the composite bypass was noted to be occluded in the ED on Thursday night.  An assistant was needed for exposure and to expedite the case.  Findings: Below-knee popliteal incision was reopened and ultimately I was able to thrombectomized the left common femoral to PT composite bypass using over-the-wire Fogarty's and ultimately got pulsatile inflow.  Retrograde iliac arteriogram showed about a 50% stenosis within his left external iliac stent and this was treated with a 6 mm drug-coated angioplasty.  Subsequently exposed the below-knee popliteal artery onto  the TP trunk and this was patched with bovine pericardial patch given severe stenosis in the proximal TP trunk.  I then disconnected the distal vein bypass and performed a new anastomosis of the PTFE graft onto the below-knee popliteal artery on the bovine patch.  Patient had excellent posterior tibial and peroneal signal at completion.  I did some superficial debridement of the previous left groin incision and placed a wound VAC.  The left great toe was partially amputated with good bleeding.  Anesthesia: General  Details: Patient was taken to the operating room after informed consent was obtained.  Placed on the operative table in supine position.  General endotracheal anesthesia was induced.  The left groin and left leg were then prepped and draped in usual sterile fashion.  Timeout was performed identify patient, procedure and site.  Initially cutdown on the below-knee popliteal incision and reopened this incision.  Immediately identified the composite bypass with no flow as demonstrated on CT.  Ultimately I disconnected the distal vein portion of the composite bypass and this was ligated with a 2-0 silk leaving the left common femoral PTFE graft into the below-knee popliteal incision.  I then went and exposed the below-knee popliteal artery onto the TP trunk as well as the peroneal posterior tibial and anterior tibial artery.  Vesseloops were placed around all of these brances.  This did require ligating several anterior tibial vein branches between 3-0 silk ties medium clips and dividing it.  Patient was given 100 units/kg heparin and ACT was checked throughout the case to maintain greater than 250.  That point time I then used 3 and 4 [place] Fogarty's to try and thrombectomized left leg PTFE graft  from the below knee popliteal expsoure.  I had significant difficulty getting the Fogarty's to pass.  Ultimately I ended up passing a Glidewire advantage across the bypass that was occluded into the left iliac  artery.  I then used a over-the-wire 6 [place] Fogarty catheter got the graft successfully thrombectomized and had pulsatile inflow.  I then put a KMP catheter up after inserting 6 French sheath in the bypass retrograde and shot a retrograde left iliac arteriogram that showed about a 50% stenosis within the left external iliac stent.  This point in time selected a 6 mm x 40 mm drug-coated Innova that was deployed across the in-stent stenosis for 3 minutes after exchanging for a Rosen wire given we needed a longer wire.  There was no evidence of significant residual stenosis and no dissection.  We did have filling of an SFA and profunda in the native system and the proximal bypass looks widely patent.  Should be noted the proximal SFA and profunda had a high-grade stenosis.  Ultimately that point time I went down to the below-knee popliteal artery and open this on to the TP trunk with a 11 blade scalpel extended with Potts scissors.  This was severely calcified and there was a proximal TP trunk stenosis based on arteriogram.  I elected to the patch this with a bovine pericardial patch using 6-0 Prolene in parachute technique.  This did require several 6-0 repair sutures.  I then straightened out the leg and the 6 mm PTFE bypass that had been previously sewn to the left common femoral artery and now that we had reestablished inflow after thrombectomy the graft was cut to the appropriate length.  I then beveled the graft and then opened the below-knee popliteal artery on the patch and then sewn end to side anastomosis for a common femoral to below-knee popliteal bypass with PTFE graft.  We had excellent signals in the peroneal and posterior tibial artery.  Everything was de-aired prior to completion.  Ultimately checked the foot had signals and 50 mg protamine was given for reversal.  The wounds were irrigated out and I used Surgicel snow and hemostatic powder for added hemostasis.  The below-knee popliteal incision was  then closed with 3-0 Vicryl and 3-0 nylon in interrupted fashion.  I then went up to the left groin and performed some superficial debridement with Metzenbaum scissors and placed a incisional VAC with Adaptic.  This wound only measured about 1 x 2 cm in two areas at his previous left groin incision from earlier this month.  I then went down the left foot at the end of the case and a partial toe imitation was performed after fishmouth incision was made to remove necrotic tip and ultimately we carried this down to the bone and the phalanges was resected back with bone cutters until there was no tension on the wound and this was closed with interrupted 3-0 nylons.  Dry sterile dressings were applied.  Complication: None  Condition: Stable  Cephus Shelling, MD [facility] [place] Office: [phone]   Cephus Shelling

## 2020-08-04 NOTE — Transfer of Care (Signed)
Immediate Anesthesia Transfer of Care Note  Patient: Robert Sanchez  Procedure(s) Performed: THROMBECTOMY OF LEFT FEMORAL TP COMPOSTITE BYPASS (Left Leg Upper) REVISION FEMORAL-DISTAL BYPASS WITH BIOLOGICAL BOVINE PATCH (Left ) LEFT ILIAC ANGIOGRAM, ANGIOPLASTY OF LEFT ILIAC STENT WITH DRUG COATED BALLOON (Left ) LEFT PARTIAL GREAT TOE AMPUTATION (Left Toe) LEFT GROIN DEBRIDEMENT WITH APPLICATION OF WOUND VAC (Left Groin)  Patient Location: PACU  Anesthesia Type:General  Level of Consciousness: awake, alert  and oriented  Airway & Oxygen Therapy: Patient Spontanous Breathing and Patient connected to face mask oxygen  Post-op Assessment: Report given to RN, Post -op Vital signs reviewed and stable and Patient moving all extremities  Post vital signs: Reviewed and stable  Last Vitals:  Vitals Value Taken Time  BP 166/71 08/04/20 1703  Temp    Pulse 96 08/04/20 1711  Resp 14 08/04/20 1711  SpO2 98 % 08/04/20 1711  Vitals shown include unvalidated device data.  Last Pain:  Vitals:   08/04/20 1001  TempSrc: Oral      Patients Stated Pain Goal: 3 (08/04/20 1017)  Complications: No complications documented.

## 2020-08-05 ENCOUNTER — Encounter (HOSPITAL_COMMUNITY): Payer: Self-pay | Admitting: Vascular Surgery

## 2020-08-05 LAB — CULTURE, BLOOD (ROUTINE X 2)
Culture: NO GROWTH
Culture: NO GROWTH
Special Requests: ADEQUATE
Special Requests: ADEQUATE

## 2020-08-05 LAB — BASIC METABOLIC PANEL
Anion gap: 11 (ref 5–15)
BUN: 24 mg/dL — ABNORMAL HIGH (ref 8–23)
CO2: 20 mmol/L — ABNORMAL LOW (ref 22–32)
Calcium: 8.4 mg/dL — ABNORMAL LOW (ref 8.9–10.3)
Chloride: 103 mmol/L (ref 98–111)
Creatinine, Ser: 1.48 mg/dL — ABNORMAL HIGH (ref 0.61–1.24)
GFR calc Af Amer: 58 mL/min — ABNORMAL LOW (ref >60)
GFR calc non Af Amer: 50 mL/min — ABNORMAL LOW (ref >60)
Glucose, Bld: 176 mg/dL — ABNORMAL HIGH (ref 70–99)
Potassium: 4.2 mmol/L (ref 3.5–5.1)
Sodium: 134 mmol/L — ABNORMAL LOW (ref 135–145)

## 2020-08-05 LAB — GLUCOSE, CAPILLARY
Glucose-Capillary: 196 mg/dL — ABNORMAL HIGH (ref 70–99)
Glucose-Capillary: 150 mg/dL — ABNORMAL HIGH (ref 70–99)
Glucose-Capillary: 154 mg/dL — ABNORMAL HIGH (ref 70–99)
Glucose-Capillary: 197 mg/dL — ABNORMAL HIGH (ref 70–99)

## 2020-08-05 LAB — LIPID PANEL
Cholesterol: 122 mg/dL (ref 0–200)
HDL: 22 mg/dL — ABNORMAL LOW (ref >40)
LDL Cholesterol: 79 mg/dL (ref 0–99)
Total CHOL/HDL Ratio: 5.5 RATIO
Triglycerides: 104 mg/dL (ref <150)
VLDL: 21 mg/dL (ref 0–40)

## 2020-08-05 LAB — POCT ACTIVATED CLOTTING TIME
Activated Clotting Time: 241 seconds
Activated Clotting Time: 241 seconds
Activated Clotting Time: 230 seconds
Activated Clotting Time: 252 seconds

## 2020-08-05 MED ORDER — INFLUENZA VAC SPLIT QUAD 0.5 ML IM SUSY
0.5000 mL | PREFILLED_SYRINGE | INTRAMUSCULAR | Status: AC
  Administered 2020-08-06: 0.5 mL via INTRAMUSCULAR
  Filled 2020-08-05: qty 0.5

## 2020-08-05 MED ORDER — ATORVASTATIN CALCIUM 80 MG PO TABS
80.0000 mg | ORAL_TABLET | Freq: Every day | ORAL | Status: DC
  Administered 2020-08-05 – 2020-08-06 (×2): 80 mg via ORAL
  Filled 2020-08-05 (×2): qty 1

## 2020-08-05 MED ORDER — MORPHINE SULFATE (PF) 2 MG/ML IV SOLN: 2.0000 mg | INTRAVENOUS | Status: DC | PRN

## 2020-08-05 MED ORDER — CLOPIDOGREL BISULFATE 75 MG PO TABS: 75.0000 mg | ORAL_TABLET | Freq: Every day | ORAL | Status: DC

## 2020-08-05 MED ORDER — CLOPIDOGREL BISULFATE 75 MG PO TABS
75.0000 mg | ORAL_TABLET | Freq: Every day | ORAL | Status: DC
  Administered 2020-08-05 – 2020-08-07 (×3): 75 mg via ORAL
  Filled 2020-08-05 (×3): qty 1

## 2020-08-05 MED ORDER — HEPARIN SODIUM (PORCINE) 5000 UNIT/ML IJ SOLN
5000.0000 [IU] | Freq: Three times a day (TID) | INTRAMUSCULAR | Status: DC
  Administered 2020-08-05 – 2020-08-07 (×6): 5000 [IU] via SUBCUTANEOUS
  Filled 2020-08-05 (×6): qty 1

## 2020-08-05 NOTE — Anesthesia Postprocedure Evaluation (Signed)
Anesthesia Post Note  Patient: MILDRED BOLLARD  Procedure(s) Performed: THROMBECTOMY OF LEFT FEMORAL TP COMPOSTITE BYPASS (Left Leg Upper) REVISION FEMORAL-DISTAL BYPASS WITH BIOLOGICAL BOVINE PATCH (Left ) LEFT ILIAC ANGIOGRAM, ANGIOPLASTY OF LEFT ILIAC STENT WITH DRUG COATED BALLOON (Left ) LEFT PARTIAL GREAT TOE AMPUTATION (Left Toe) LEFT GROIN DEBRIDEMENT WITH APPLICATION OF WOUND VAC (Left Groin)     Patient location during evaluation: PACU Anesthesia Type: General Level of consciousness: awake and alert Pain management: pain level controlled Vital Signs Assessment: post-procedure vital signs reviewed and stable Respiratory status: spontaneous breathing, nonlabored ventilation, respiratory function stable and patient connected to nasal cannula oxygen Cardiovascular status: blood pressure returned to baseline and stable Postop Assessment: no apparent nausea or vomiting Anesthetic complications: no   No complications documented.  Last Vitals:  Vitals:   08/05/20 0200 08/05/20 0400  BP: 117/61 120/63  Pulse: 86 86  Resp: 17 16  Temp:  36.9 C  SpO2: 92% 93%    Last Pain:  Vitals:   08/05/20 0630  TempSrc:   PainSc: 2                  Tray Klayman S

## 2020-08-05 NOTE — Progress Notes (Addendum)
VASCULAR SURGERY ASSESSMENT & PLAN:   PAD; critical limb ischemia of the left lower extremity with tissue loss and occluded left common femoral to posterior tibial artery composite bypass 1 Day Post-Op: thrombectomy left CFA to PT composite graft, DCB angiplasty of left EIA stent, left below-knee popliteal artery and TP trunk bovine pericardial patch angioplasty left common femoral to below knee popliteal artery bypass with PTFE onto the below-knee popliteal artery bovine pericardial patch, debridement of left groin with placement of negative pressure wound VAC, partial left great toe amputation. LLE will perfused.  Will increase PRN frequency of morphine for break-through pain.  Out-of-bed today. Take down left foot dressing and wound VAC tomorrow. Leave lower leg incision open to air. Continue aspirin and restart Plavix.    DVT prophy: heparin [place]  Elevated creatinine: 1.48 (baseline = 1.03). Excellent urine output  DM: CBG range 122-196  CAD: previous CABG x 3 vessels. Stable  SUBJECTIVE:   Had episode of marked post-op pain that resolved with removing ACE wrap, narcotics. Denies chest pain or SOB.  PHYSICAL EXAM:   Vitals:   08/05/20 0100 08/05/20 0140 08/05/20 0200 08/05/20 0400  BP: (!) 114/59  117/61 120/63  Pulse: 86 88 86 86  Resp: 15 18 17 16   Temp:    98.4 F (36.9 C)  TempSrc:    Oral  SpO2: 92% 91% 92% 93%  Weight:      Height:       General appearance: awake, alert in NAD Lungs: CTAB Card: RRR Incisions: left groin wound VAC in place with good seal.  LLE: Brisk left DP and PT. Left BK incision is clean/dry and without bleeding or hematoma. Left GT amp site dressing with small amount of strike-through. RLE: warm, pink, no tissue loss  LABS:   Lab Results  Component Value Date   WBC 10.1 08/05/2020   HGB 9.0 (L) 08/05/2020   HCT 28.7 (L) 08/05/2020   MCV 91.4 08/05/2020   PLT 299 08/05/2020   Lab Results  Component Value Date   CREATININE 1.48 (H)  08/05/2020   Lab Results  Component Value Date   INR 1.2 08/04/2020   CBG (last 3)  Recent Labs    08/04/20 1707 08/04/20 2242 08/05/20 0701  GLUCAP 166* 171* 196*    PROBLEM LIST:    Active Problems:   PAD (peripheral artery disease) (HCC)   CURRENT MEDS:   . aspirin EC  81 mg Oral Q0600  . atorvastatin  80 mg Oral QHS  . carvedilol  6.25 mg Oral BID WC  . chlorhexidine  15 mL Mouth/Throat Once  . docusate sodium  100 mg Oral Daily  . furosemide  40 mg Oral Daily  . glipiZIDE  10 mg Oral BID AC  . insulin aspart  0-15 Units Subcutaneous TID WC  . pantoprazole  40 mg Oral Daily   10/05/20, Wendi Maya Office: [phone] 08/05/2020   I have seen and evaluated the patient. I agree with the PA note as documented above.  Post-op day 1 status post thrombectomy of left common femoral to posterior tibial composite bypass with angioplasty of in-stent stenosis of left iliac stent and then new distal target onto the BK pop after patch angioplasty of the BK pop TP trunk.  Partial left great toe amputation as well.  He has very brisk posterior tibial and anterior tibial signal.  Start Plavix in addition to aspirin for iliac angioplasty.  Pain is better controlled this morning.  Out of  bed to chair.  We will get him a Darco shoe for the left great toe amputation.  We will leave incisional VAC in left groin until tomorrow.    Cephus Shelling, MD [facility] Office: [phone]

## 2020-08-05 NOTE — Progress Notes (Signed)
PHARMACIST LIPID MONITORING   Robert Sanchez is a 64 y.o. male admitted on 08/04/2020 with occluded L bypass.  Pharmacy has been consulted to optimize lipid-lowering therapy with the indication of secondary prevention for clinical ASCVD.  Recent Labs:  Lipid Panel (last 6 months):   Lab Results  Component Value Date   CHOL 122 08/05/2020   TRIG 104 08/05/2020   HDL 22 (L) 08/05/2020   CHOLHDL 5.5 08/05/2020   VLDL 21 08/05/2020   LDLCALC 79 08/05/2020    Hepatic function panel (last 6 months):   Lab Results  Component Value Date   AST 15 08/04/2020   ALT 18 08/04/2020   ALKPHOS 51 08/04/2020   BILITOT 0.9 08/04/2020    SCr (since admission):   Serum creatinine: 1.48 mg/dL (H) 94/70/96 2836 Estimated creatinine clearance: 60.2 mL/min (A)  Current lipid-lowering therapy: atorvastatin 40mg  daily Previous lipid-lowering therapies (if applicable): n/a Documented or reported allergies or intolerances to lipid-lowering therapies (if applicable): n/a  Assessment:  Patient agrees with changes to lipid-lowering therapy  Recommendation per protocol:  Increase intensity or dose of current statin.  Follow-up with:  Primary care provider - , NP  Follow-up labs after discharge:    Liver function panel and lipid panel in 8-12 weeks then annually  Plan: -Increase atorvastatin to 80mg  daily   10-12, PharmD, BCPS Clinical Pharmacist [phone] Please check AMION for all [facility] numbers 08/05/2020

## 2020-08-05 NOTE — Evaluation (Signed)
Physical Therapy Evaluation Patient Details Name: AN SCHNABEL MRN: [id number] DOB: [birth year] Today's Date: 08/05/2020   History of Present Illness  64 yo admitted with left grat toe pain with gangrene and occluded left fem PT BPG s/p partial great toe amputation with left groin debridement and VAC placement, thrombectomy of BPG on 10/4. PMHx:LLE ischemia s/p redo left fempop BPG with left iliac/femoral/tibial artery thrombectomy 9/9, left fem pop BPG 08/21/18 with thrombectomy 12/2019, COPD, DM, neuropathy, HOH  Clinical Impression  Pt admitted with/for gangrene L great toe and L LE ischemia, s/p BPG ing and partial great toe amp.  Pt generally min guard to supervision level overall.  Pt currently limited functionally due to the problems listed below.  (see problems list.)  Pt will benefit from PT to maximize function and safety to be able to get home safely with limited available assist.     Follow Up Recommendations No PT follow up;Supervision - Intermittent    Equipment Recommendations       Recommendations for Other Services       Precautions / Restrictions Precautions Precautions: Fall Required Braces or Orthoses: Other Brace Other Brace: post-op shoe Restrictions Weight Bearing Restrictions: Yes RLE Weight Bearing: Weight bearing as tolerated      Mobility  Bed Mobility Overal bed mobility: Needs Assistance Bed Mobility: Supine to Sit     Supine to sit: Supervision;HOB elevated     General bed mobility comments: +rail, supervision for safety/lines  Transfers Overall transfer level: Needs assistance Equipment used: None (SPT only) Transfers: Stand Pivot Transfers   Stand pivot transfers: Supervision;Min guard       General transfer comment: increased time, close by for safety  Ambulation/Gait Ambulation/Gait assistance: Min guard Gait Distance (Feet): 200 Feet Assistive device: Rolling walker (2 wheeled) Gait Pattern/deviations: Step-to  pattern;Step-through pattern;Decreased stride length Gait velocity: decreased Gait velocity interpretation: <1.8 ft/sec, indicate of risk for recurrent falls General Gait Details: antalgic, but steady gait with RW  Stairs Stairs: Yes Stairs assistance: Min assist Stair Management: One rail Right;Step to pattern;Forwards Number of Stairs: 3 General stair comments: cues for step to pattern, otherwise safe and steady with something to touch  Wheelchair Mobility    Modified Rankin (Stroke Patients Only)       Balance Overall balance assessment: Mild deficits observed, not formally tested                                           Pertinent Vitals/Pain Pain Assessment: Faces Faces Pain Scale: Hurts little more Pain Location: LLE - calf incision Pain Descriptors / Indicators: Discomfort;Sore Pain Intervention(s): Monitored during session    Home Living Family/patient expects to be discharged to:: Private residence Living Arrangements: Alone Available Help at Discharge: Friend(s);Available PRN/intermittently Type of Home: House Home Access: Stairs to enter Entrance Stairs-Rails: None Entrance Stairs-Number of Steps: 2 Home Layout: One level Home Equipment: Walker - 2 wheels;Gilmer Mor - single point 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 pt wants to make it to 64 yo prior to retirement    Prior Function Level of Independence: Independent         Comments: since last sx, Pt had progressed off the RW, neighbors help with meals     Hand Dominance   Dominant Hand: Right    Extremity/Trunk Assessment   Upper Extremity  Assessment Upper Extremity Assessment: Overall WFL for tasks assessed    Lower Extremity Assessment Lower Extremity Assessment: LLE deficits/detail LLE Deficits / Details: incisions clean, noted increased red blood on bandage RN notified LLE Coordination: decreased  gross motor    Cervical / Trunk Assessment Cervical / Trunk Assessment: Normal  Communication   Communication: HOH  Cognition Arousal/Alertness: Awake/alert Behavior During Therapy: WFL for tasks assessed/performed Overall Cognitive Status: Within Functional Limits for tasks assessed                                        General Comments      Exercises     Assessment/Plan    PT Assessment Patient needs continued PT services  PT Problem List Decreased mobility;Decreased activity tolerance;Decreased range of motion;Decreased knowledge of use of DME;Pain       PT Treatment Interventions DME instruction;Therapeutic exercise;Gait training;Functional mobility training;Therapeutic activities;Patient/family education;Stair training    PT Goals (Current goals can be found in the Care Plan section)  Acute Rehab PT Goals Patient Stated Goal: "I just want this surgery to work" PT Goal Formulation: With patient Time For Goal Achievement: 08/12/20 Potential to Achieve Goals: Good    Frequency Min 3X/week   Barriers to discharge Decreased caregiver support      Co-evaluation               AM-PAC PT "6 Clicks" Mobility  Outcome Measure Help needed turning from your back to your side while in a flat bed without using bedrails?: None Help needed moving from lying on your back to sitting on the side of a flat bed without using bedrails?: None Help needed moving to and from a bed to a chair (including a wheelchair)?: None Help needed standing up from a chair using your arms (e.g., wheelchair or bedside chair)?: A Little Help needed to walk in hospital room?: A Little Help needed climbing 3-5 steps with a railing? : A Little 6 Click Score: 21    End of Session   Activity Tolerance: Patient tolerated treatment well Patient left: in chair;with call bell/phone within reach Nurse Communication: Mobility status PT Visit Diagnosis: Other abnormalities of gait and  mobility (R26.89);Difficulty in walking, not elsewhere classified (R26.2)    Time: 2956-2130 PT Time Calculation (min) (ACUTE ONLY): 25 min   Charges:   PT Evaluation $PT Eval Moderate Complexity: 1 Mod PT Treatments $Gait Training: 8-22 mins        08/05/2020  Jacinto Halim., PT Acute Rehabilitation Services [phone]  (pager) [phone]  (office)  Eliseo Gum Corda Shutt 08/05/2020, 1:28 PM

## 2020-08-05 NOTE — Evaluation (Signed)
Occupational Therapy Evaluation Patient Details Name: Robert Sanchez MRN: [id number] DOB: Feb 10, 1956 Today's Date: 08/05/2020    History of Present Illness 64 yo admitted with left grat toe pain with gangrene and occluded left fem PT BPG s/p partial great toe amputation with left groin debridement and VAC placement, thrombectomy of BPG on 10/4. PMHx:LLE ischemia s/p redo left fempop BPG with left iliac/femoral/tibial artery thrombectomy 9/9, left fem pop BPG 08/21/18 with thrombectomy 12/2019, COPD, DM, neuropathy, HOH   Clinical Impression   Since recent sx, Pt was mobilizing around home without DME (progressed past RW) and performing his own ADL. Today Pt was limited to SPT only during session due to no post-op shoe in room (talked with PA on phone and it is on the way - he needs to wear this for mobilizing) Pt was very good at WB through heel only during transfers. Pt mod A for LB dressing (to don underwear) and will need further OT acutely to address LB ADL needs as well as post-acute in the home setting to maximize safety and independence in ADL and functional transfers. OT to work on AE education next session for LB ADL as well as balance in standing for grooming once shoe arrives.     Follow Up Recommendations  Home health OT    Equipment Recommendations  3 in 1 bedside commode    Recommendations for Other Services       Precautions / Restrictions Precautions Precautions: Fall Required Braces or Orthoses: Other Brace Other Brace: post-op shoe Restrictions Weight Bearing Restrictions: Yes RLE Weight Bearing: Weight bearing as tolerated (through heel only in post-op Shoe)      Mobility Bed Mobility Overal bed mobility: Needs Assistance Bed Mobility: Supine to Sit     Supine to sit: Supervision;HOB elevated     General bed mobility comments: +rail, supervision for safety/lines  Transfers Overall transfer level: Needs assistance Equipment used: None (SPT  only) Transfers: Stand Pivot Transfers   Stand pivot transfers: Min guard       General transfer comment: increased time, min guard for safety    Balance Overall balance assessment: Mild deficits observed, not formally tested                                         ADL either performed or assessed with clinical judgement   ADL Overall ADL's : Needs assistance/impaired Eating/Feeding: Independent   Grooming: Supervision/safety;Sitting   Upper Body Bathing: Set up;Sitting   Lower Body Bathing: Moderate assistance;Sitting/lateral leans   Upper Body Dressing : Set up   Lower Body Dressing: Moderate assistance;Sit to/from stand Lower Body Dressing Details (indicate cue type and reason): assist to thread legs through boxer leg holes Toilet Transfer: Min Pension scheme manager Details (indicate cue type and reason): WB through heel in post-op shoe Toileting- Clothing Manipulation and Hygiene: Min guard       Functional mobility during ADLs: Min guard (SPT only)       Vision Patient Visual Report: No change from baseline       Perception     Praxis      Pertinent Vitals/Pain Pain Assessment: Faces Faces Pain Scale: Hurts little more Pain Location: LLE - calf incision Pain Descriptors / Indicators: Discomfort;Sore Pain Intervention(s): Monitored during session;Repositioned (elevation of LLE)     Hand Dominance Right   Extremity/Trunk Assessment Upper Extremity Assessment Upper Extremity Assessment: Overall  WFL for tasks assessed   Lower Extremity Assessment Lower Extremity Assessment: LLE deficits/detail LLE Deficits / Details: incisions clean, noted increased red blood on bandage RN notified LLE Coordination: decreased gross motor   Cervical / Trunk Assessment Cervical / Trunk Assessment: Normal   Communication Communication Communication: HOH   Cognition Arousal/Alertness: Awake/alert Behavior During Therapy: WFL for tasks  assessed/performed Overall Cognitive Status: Within Functional Limits for tasks assessed                                     General Comments       Exercises     Shoulder Instructions      Home Living Family/patient expects to be discharged to:: Private residence Living Arrangements: Alone Available Help at Discharge: Friend(s);Available PRN/intermittently Type of Home: House Home Access: Stairs to enter [employer] of Steps: 2 Entrance Stairs-Rails: None Home Layout: One level     Bathroom Shower/Tub: Chief Strategy Officer: Standard     Home Equipment: Environmental consultant - 2 wheels;Gilmer Mor - single point   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 pt wants to make it to 65 yo prior to retirement      Prior Functioning/Environment Level of Independence: Independent        Comments: since last sx, Pt had progressed off the RW, neighbors help with meals        OT Problem List: Decreased strength;Decreased activity tolerance;Impaired balance (sitting and/or standing);Decreased safety awareness;Decreased knowledge of use of DME or AE;Decreased knowledge of precautions;Pain      OT Treatment/Interventions: Self-care/ADL training;Therapeutic exercise;DME and/or AE instruction;Therapeutic activities;Balance training;Patient/family education    OT Goals(Current goals can be found in the care plan section) Acute Rehab OT Goals Patient Stated Goal: "I just want this surgery to work" OT Goal Formulation: With patient Time For Goal Achievement: 08/19/20 Potential to Achieve Goals: Good ADL Goals Pt Will Perform Grooming: with supervision;standing Pt Will Perform Lower Body Bathing: with modified independence;with adaptive equipment;sitting/lateral leans Pt Will Perform Lower Body Dressing: with modified independence;with adaptive equipment;sit to/from stand Pt Will Transfer to  Toilet: with modified independence;ambulating Pt Will Perform Toileting - Clothing Manipulation and hygiene: with modified independence;sit to/from stand  OT Frequency: Min 2X/week   Barriers to D/C:            Co-evaluation              AM-PAC OT "6 Clicks" Daily Activity     Outcome Measure Help from another person eating meals?: None Help from another person taking care of personal grooming?: A Little Help from another person toileting, which includes using toliet, bedpan, or urinal?: A Little Help from another person bathing (including washing, rinsing, drying)?: A Little Help from another person to put on and taking off regular upper body clothing?: A Little Help from another person to put on and taking off regular lower body clothing?: A Lot 6 Click Score: 18   End of Session Nurse Communication: Mobility status;Precautions;Weight bearing status  Activity Tolerance: Patient tolerated treatment well Patient left: in chair;with call bell/phone within reach  OT Visit Diagnosis: Unsteadiness on feet (R26.81);Muscle weakness (generalized) (M62.81);Pain Pain - Right/Left: Left Pain - part of body: Leg                Time: 1032-1101 OT Time Calculation (min): 29 min Charges:  OT  General Charges $OT Visit: 1 Visit OT Evaluation $OT Eval Moderate Complexity: 1 Mod OT Treatments $Self Care/Home Management : 8-22 mins  Nyoka Cowden OTR/L Acute Rehabilitation Services Pager: [phone] Office: [phone]  Evern Bio Lavoris Canizales 08/05/2020, 12:36 PM

## 2020-08-05 NOTE — Progress Notes (Signed)
Mobility Specialist - Progress Note   08/05/20 1420  Mobility  Activity Ambulated in hall  Level of Assistance Modified independent, requires aide device or extra time  Assistive Device Front wheel walker  Distance Ambulated (ft) 280 ft  Mobility Response Tolerated well  Mobility performed by Mobility specialist  $Mobility charge 1 Mobility    Pre-mobility: 92 HR During mobility: 93 HR Post-mobility: 77 HR  Pt c/o some LLE soreness, but otherwise was asx.   Mamie Levers Mobility Specialist Mobility Specialist Phone: [phone]

## 2020-08-05 NOTE — Progress Notes (Signed)
Orthopedic Tech Progress Note Patient Details:  Robert Sanchez 1956-07-28 [id number]  Ortho Devices Type of Ortho Device: Postop shoe/boot Ortho Device/Splint Location: LLE Ortho Device/Splint Interventions: Ordered, Application, Adjustment   Post Interventions Patient Tolerated: Well Instructions Provided: Care of device   Donald Pore 08/05/2020, 12:23 PM

## 2020-08-06 LAB — GLUCOSE, CAPILLARY
Glucose-Capillary: 140 mg/dL — ABNORMAL HIGH (ref 70–99)
Glucose-Capillary: 98 mg/dL (ref 70–99)
Glucose-Capillary: 136 mg/dL — ABNORMAL HIGH (ref 70–99)
Glucose-Capillary: 220 mg/dL — ABNORMAL HIGH (ref 70–99)

## 2020-08-06 LAB — CBC
HCT: 28.7 % — ABNORMAL LOW (ref 39.0–52.0)
Hemoglobin: 9 g/dL — ABNORMAL LOW (ref 13.0–17.0)
MCH: 28.7 pg (ref 26.0–34.0)
MCHC: 31.4 g/dL (ref 30.0–36.0)
MCV: 91.4 fL (ref 80.0–100.0)
Platelets: 299 10*3/uL (ref 150–400)
RBC: 3.14 MIL/uL — ABNORMAL LOW (ref 4.22–5.81)
RDW: 14.1 % (ref 11.5–15.5)
WBC: 10.1 10*3/uL (ref 4.0–10.5)
nRBC: 0 % (ref 0.0–0.2)

## 2020-08-06 LAB — BASIC METABOLIC PANEL
Anion gap: 10 (ref 5–15)
BUN: 21 mg/dL (ref 8–23)
CO2: 20 mmol/L — ABNORMAL LOW (ref 22–32)
Calcium: 8.1 mg/dL — ABNORMAL LOW (ref 8.9–10.3)
Chloride: 104 mmol/L (ref 98–111)
Creatinine, Ser: 1.42 mg/dL — ABNORMAL HIGH (ref 0.61–1.24)
GFR calc non Af Amer: 53 mL/min — ABNORMAL LOW (ref >60)
Glucose, Bld: 156 mg/dL — ABNORMAL HIGH (ref 70–99)
Potassium: 4 mmol/L (ref 3.5–5.1)
Sodium: 134 mmol/L — ABNORMAL LOW (ref 135–145)

## 2020-08-06 NOTE — Progress Notes (Signed)
Mobility Specialist - Progress Note   08/06/20 1338  Mobility  Activity Ambulated in hall  Level of Assistance Modified independent, requires aide device or extra time  Assistive Device Front wheel walker  Distance Ambulated (ft) 340 ft  Mobility Response Tolerated well  Mobility performed by Mobility specialist  $Mobility charge 1 Mobility    Pre-mobility: 84 HR During mobility: 95 HR Post-mobility: 83 HR  Pt asx throughout ambulation.   Mamie Levers Mobility Specialist Mobility Specialist Phone: [phone]

## 2020-08-06 NOTE — Progress Notes (Signed)
{  OT ALL NOTOT Cancellation Note  Patient Details Name: Robert Sanchez MRN: [id number] DOB: [birth year]   Cancelled Treatment:    Reason Eval/Treat Not Completed: Patient declined, no reason specified  Suzanna Obey 08/06/2020, 4:30 PM  08/06/2020  Luan Pulling, OTR/L  Acute Rehabilitation Services  Office:  [phone]

## 2020-08-06 NOTE — Progress Notes (Addendum)
Progress Note    08/06/2020 8:32 AM 2 Days Post-Op  Subjective: no major complaints this morning. States he wants to go home. Has been oob to chair and ambulated in hall this morning   Vitals:   08/05/20 2301 08/06/20 0807  BP: 113/63 113/73  Pulse: 72 74  Resp: 17 15  Temp: 97.7 F (36.5 C) 97.8 F (36.6 C)  SpO2: 95% 94%   Physical Exam: Cardiac: regular rate and rhythm Lungs: non labored Incisions:  Left groin incision with Wound VAC- good seal. No output. Left popliteal incision c/d/i. Left partial great toe amputation site c/d/i    Extremities: 2+ femoral pulses, brisk doppler DP and PT left foot. Left foot warm. Right foot monophasic pero signal. R foot warm Neurologic:alert and oriented  CBC    Component Value Date/Time   WBC 10.1 08/05/2020 0524   RBC 3.14 (L) 08/05/2020 0524   HGB 9.0 (L) 08/05/2020 0524   HCT 28.7 (L) 08/05/2020 0524   PLT 299 08/05/2020 0524   MCV 91.4 08/05/2020 0524   MCH 28.7 08/05/2020 0524   MCHC 31.4 08/05/2020 0524   RDW 14.1 08/05/2020 0524   LYMPHSABS 1.5 01/15/2020 0750   MONOABS 1.2 (H) 01/15/2020 0750   EOSABS 0.2 01/15/2020 0750   BASOSABS 0.1 01/15/2020 0750    BMET    Component Value Date/Time   NA 134 (L) 08/06/2020 0615   NA 137 06/27/2020 1216   K 4.0 08/06/2020 0615   CL 104 08/06/2020 0615   CO2 20 (L) 08/06/2020 0615   GLUCOSE 156 (H) 08/06/2020 0615   BUN 21 08/06/2020 0615   BUN 17 06/27/2020 1216   CREATININE 1.42 (H) 08/06/2020 0615   CREATININE 0.81 08/14/2014 1126   CALCIUM 8.1 (L) 08/06/2020 0615   GFRNONAA 53 (L) 08/06/2020 0615   GFRNONAA >89 08/14/2014 1126   GFRAA 58 (L) 08/05/2020 0524   GFRAA >89 08/14/2014 1126    INR    Component Value Date/Time   INR 1.2 08/04/2020 1037     Intake/Output Summary (Last 24 hours) at 08/06/2020 0832 Last data filed at 08/06/2020 0600 Gross per 24 hour  Intake 1858.21 ml  Output 1320 ml  Net 538.21 ml     Assessment/Plan:  64 y.o. male is  s/p post thrombectomy of left common femoral to posterior tibial composite bypass with angioplasty of in-stent stenosis of left iliac stent and then new distal target onto the BK pop after patch angioplasty of the BK pop TP trunk.  Partial left great toe amputation as well. 2 Days Post-Op. Left leg well perfused. Brisk PT and DP doppler signals. Left partial great toe amputation looks good. Sutures intact. Pain better controlled. oob to chair and continue to mobilize. Has Darco shoe. Patient had just gotten in chair but will come back later today for left groin wound vac change. PT not recommending any f/u. Will order 3:1 bedside commode for home. On Asa and plavix. Anticipate d/c tomorrow  DVT prophylaxis:  [place] heparin    Graceann Congress, PA-C [facility] [phone] 08/06/2020 8:32 AM   I have seen and evaluated the patient. I agree with the PA note as documented above.  Excellent left PT Doppler signal.  Dressing change to left partial great toe amp and looks good.  Left below-knee pop incision looks good as well.  Continue aspirin Plavix.  Has walked yesterday and pain is better controlled.  Likely one more day in the hospital.  Has Darco shoe.  Will change his incisional VAC to the left groin once he gets back in bed and probably do wet-to-dry.  Creatinine stable given baseline CKD.  Cephus Shelling, MD [facility] [place] Office: [phone]     Addendum: Wound VAC removed from left groin. Wound is well appearing.   Wet to dry dressings applied

## 2020-08-06 NOTE — Progress Notes (Signed)
Physical Therapy Treatment Patient Details Name: Robert Sanchez MRN: [id number] DOB: [birth year] Today's Date: 08/06/2020    History of Present Illness 64 yo admitted with left grat toe pain with gangrene and occluded left fem PT BPG s/p partial great toe amputation with left groin debridement and VAC placement, thrombectomy of BPG on 10/4. PMHx:LLE ischemia s/p redo left fempop BPG with left iliac/femoral/tibial artery thrombectomy 9/9, left fem pop BPG 08/21/18 with thrombectomy 12/2019, COPD, DM, neuropathy, HOH    PT Comments    Pt tolerates treatment well, demonstrating reduced assistance requirements during all mobility this session. Pt continues to demonstrate good endurance despite reports of pain. Pt also demonstrates improved static and dynamic standing balance. Pt will benefit from continued acute PT POC to reduce falls risk and restore independence. PT continues to recommend no PT or DME needs.   Follow Up Recommendations  No PT follow up;Supervision - Intermittent     Equipment Recommendations  None recommended by PT    Recommendations for Other Services       Precautions / Restrictions Precautions Precautions: Fall Required Braces or Orthoses: Other Brace Other Brace: post-op shoe Restrictions Weight Bearing Restrictions: No RLE Weight Bearing: Non weight bearing LLE Weight Bearing: Weight bearing as tolerated    Mobility  Bed Mobility Overal bed mobility: Modified Independent Bed Mobility: Supine to Sit;Sit to Supine     Supine to sit: Modified independent (Device/Increase time) Sit to supine: Modified independent (Device/Increase time)   General bed mobility comments: HOB elevated and increased time  Transfers Overall transfer level: Needs assistance Equipment used: Rolling walker (2 wheeled) Transfers: Sit to/from Stand Sit to Stand: Supervision            Ambulation/Gait Ambulation/Gait assistance: Supervision Gait Distance (Feet): 200  Feet Assistive device: Rolling walker (2 wheeled) Gait Pattern/deviations: Step-to pattern;Trunk flexed Gait velocity: reduced Gait velocity interpretation: <1.8 ft/sec, indicate of risk for recurrent falls General Gait Details: pt ambulating with reduced stance time on LLE and increased trunk flexion   Stairs Stairs: Yes Stairs assistance: Supervision Stair Management: One rail Right;Step to pattern Number of Stairs: 3     Wheelchair Mobility    Modified Rankin (Stroke Patients Only)       Balance Overall balance assessment: Needs assistance Sitting-balance support: No upper extremity supported;Feet supported Sitting balance-Leahy Scale: Good     Standing balance support: No upper extremity supported Standing balance-Leahy Scale: Fair                              Cognition Arousal/Alertness: Awake/alert Behavior During Therapy: WFL for tasks assessed/performed Overall Cognitive Status: Within Functional Limits for tasks assessed                                        Exercises      General Comments General comments (skin integrity, edema, etc.): VSS on RA      Pertinent Vitals/Pain Pain Assessment: 0-10 Pain Score: 10-Worst pain ever Pain Location: LLE Pain Descriptors / Indicators: Sore Pain Intervention(s): Patient requesting pain meds-RN notified    Home Living                      Prior Function            PT Goals (current goals can now be found in the care  plan section) Acute Rehab PT Goals Patient Stated Goal: "I just want this surgery to work" Progress towards PT goals: Progressing toward goals    Frequency    Min 3X/week      PT Plan Current plan remains appropriate    Co-evaluation              AM-PAC PT "6 Clicks" Mobility   Outcome Measure  Help needed turning from your back to your side while in a flat bed without using bedrails?: None Help needed moving from lying on your back to  sitting on the side of a flat bed without using bedrails?: None Help needed moving to and from a bed to a chair (including a wheelchair)?: None Help needed standing up from a chair using your arms (e.g., wheelchair or bedside chair)?: None Help needed to walk in hospital room?: None Help needed climbing 3-5 steps with a railing? : None 6 Click Score: 24    End of Session   Activity Tolerance: Patient tolerated treatment well Patient left: in bed;with call bell/phone within reach Nurse Communication: Mobility status PT Visit Diagnosis: Other abnormalities of gait and mobility (R26.89);Difficulty in walking, not elsewhere classified (R26.2)     Time: 5436-0677 PT Time Calculation (min) (ACUTE ONLY): 19 min  Charges:  $Gait Training: 8-22 mins                     Arlyss Gandy, PT, DPT Acute Rehabilitation Pager: [phone]    Arlyss Gandy 08/06/2020, 12:05 PM

## 2020-08-07 ENCOUNTER — Other Ambulatory Visit (HOSPITAL_COMMUNITY): Payer: Self-pay | Admitting: Physician Assistant

## 2020-08-07 LAB — PROTIME-INR
INR: 1.1 (ref 0.8–1.2)
Prothrombin Time: 14.1 seconds (ref 11.4–15.2)

## 2020-08-07 LAB — GLUCOSE, CAPILLARY: Glucose-Capillary: 92 mg/dL (ref 70–99)

## 2020-08-07 MED ORDER — OXYCODONE-ACETAMINOPHEN 5-325 MG PO TABS: 1.0000 | ORAL_TABLET | Freq: Four times a day (QID) | ORAL | 0 refills | Status: DC | PRN

## 2020-08-07 MED ORDER — CLOPIDOGREL BISULFATE 75 MG PO TABS
75.0000 mg | ORAL_TABLET | Freq: Every day | ORAL | 2 refills | Status: DC
Start: 2020-08-07 — End: 2020-08-07

## 2020-08-07 MED ORDER — ATORVASTATIN CALCIUM 80 MG PO TABS: 80.0000 mg | ORAL_TABLET | Freq: Every day | ORAL | 4 refills | Status: DC

## 2020-08-07 MED FILL — CLOPIDOGREL 75 MG TABLET: 75 | 30 days supply | Qty: 30 | Fill #0

## 2020-08-07 MED FILL — OXYCODONE-APAP 5-325MG: 5-325 | 5 days supply | Qty: 20 | Fill #0

## 2020-08-07 MED FILL — ATORVASTATIN CALCIUM 80 MG: 80 | 30 days supply | Qty: 30 | Fill #0

## 2020-08-07 NOTE — Discharge Summary (Signed)
Discharge Summary     Robert Sanchez E Perkin June 12, 1956 64 y.o. male  [id number]  Admission Date: 08/04/2020  Discharge Date: 08/07/2020   Physician: Cephus Shellinglark, Christopher J, MD  Admission Diagnosis: PAD (peripheral artery disease) [facility](HCC) [I73.9]  HPI:  This is a 64 y.o. male status post thrombolysis and subsequent percutaneous mechanical thrombectomy of left common femoral to PT bypass on 01/16/20-01/17/2020 after his bypass occluded.He presents for continued follow-up of his left great toe wound that he developed after his bypass occluded back in March. Difficult to visualize flow in the bypass todayand suspect this may be occluded.He has been medically noncompliant and has not been taking his Eliquis as we have instructed for high risk bypass failure. He isnot having any severe rest pain or progression of his wound like he had in the pastwhen his bypass previously occluded. I recommended a left leg arteriogram possible intervention and possible thrombolysis. I will try anddo this tomorrow given unclear to me the timeline of his bypasses occlusionand this may not be salvageable as I discussed with him today. He isat high risk for limb loss.Risks and benefits discussed in detail.  Hospital Course:  The patient was admitted to the hospital and taken to the operating room on 08/04/2020 and underwent: 1.  Thrombectomy of left common femoral to PT composite bypass 2.  Retrograde left iliac arteriogram with drug-coated balloon angioplasty of left external iliac artery stent (6 mm x 40 mm drug-coated impact) 3.  Left below-knee popliteal artery and TP trunk bovine pericardial patch angioplasty 4.  Left common femoral to below knee popliteal artery bypass with PTFE onto the below-knee popliteal artery bovine pericardial patch 5.  Debridement of left groin with placement of negative pressure wound VAC 6.  Partial left great toe amputation  Findings: Below-knee popliteal incision was  reopened and ultimately I was able to thrombectomized the left common femoral to PT composite bypass using over-the-wire Fogarty's and ultimately got pulsatile inflow.  Retrograde iliac arteriogram showed about a 50% stenosis within his left external iliac stent and this was treated with a 6 mm drug-coated angioplasty.  Subsequently exposed the below-knee popliteal artery onto the TP trunk and this was patched with bovine pericardial patch given severe stenosis in the proximal TP trunk.  I then disconnected the distal vein bypass and performed a new anastomosis of the PTFE graft onto the below-knee popliteal artery on the bovine patch.  Patient had excellent posterior tibial and peroneal signal at completion.  I did some superficial debridement of the previous left groin incision and placed a wound VAC. The left great toe was partially amputated with good bleeding.  The pt tolerated the procedure well and was transported to the PACU in good condition.   By POD 1, left lower extremity remained well perfused. Brisk DP and PT signals. Incisions intact and without hematoma. Left 1st toe amputation site intact and appearing viable. Pain not well controlled so prn morphine was added. Plavix and aspirin started. Darco shoe ordered. Wound VAC left on left groin with no output. Encouraged mobilization  POD #2, continued adequate perfusion of left lower extremity with brisk DP and PT pulse. Incisions intact. Wound VAC removed with healthy appearing wound. Wet to dry dressings started. Pain better controlled. Patient tolerated ambulating. Tolerated diet.   The remainder of the hospital course consisted of increasing mobilization and increasing intake of solids without difficulty.  POD# 3 Left lower extremity well perfused. Brisk DP and PT pulses. PT/OT with no recommended follow up.  Pain controlled with po medications. atient stable for discharge. Instructed on wet to dry dressing changes to the left groin. Left leg  and toe incisions clean, dry and intact. PDMB checked and patients post op pain medication sent to his requested pharmacy. Patient to not restart his coumadin but will continue Aspirin and Plavix. Patient refused home health RN for wound care. He will follow up with Dr. Chestine Spore in 3 weeks with non invasive arterial studies as well as suture removal.    CBC    Component Value Date/Time   WBC 10.1 08/05/2020 0524   RBC 3.14 (L) 08/05/2020 0524   HGB 9.0 (L) 08/05/2020 0524   HCT 28.7 (L) 08/05/2020 0524   PLT 299 08/05/2020 0524   MCV 91.4 08/05/2020 0524   MCH 28.7 08/05/2020 0524   MCHC 31.4 08/05/2020 0524   RDW 14.1 08/05/2020 0524   LYMPHSABS 1.5 01/15/2020 0750   MONOABS 1.2 (H) 01/15/2020 0750   EOSABS 0.2 01/15/2020 0750   BASOSABS 0.1 01/15/2020 0750    BMET    Component Value Date/Time   NA 134 (L) 08/06/2020 0615   NA 137 06/27/2020 1216   K 4.0 08/06/2020 0615   CL 104 08/06/2020 0615   CO2 20 (L) 08/06/2020 0615   GLUCOSE 156 (H) 08/06/2020 0615   BUN 21 08/06/2020 0615   BUN 17 06/27/2020 1216   CREATININE 1.42 (H) 08/06/2020 0615   CREATININE 0.81 08/14/2014 1126   CALCIUM 8.1 (L) 08/06/2020 0615   GFRNONAA 53 (L) 08/06/2020 0615   GFRNONAA >89 08/14/2014 1126   GFRAA 58 (L) 08/05/2020 0524   GFRAA >89 08/14/2014 1126     Discharge Instructions    Discharge patient   Complete by: As directed    Discharge disposition: 01-Home or Self Care   Discharge patient date: 08/07/2020      Discharge Diagnosis:  PAD (peripheral artery disease) (HCC) [I73.9]  Secondary Diagnosis: Patient Active Problem List   Diagnosis Date Noted  . Acute venous embolism and thrombosis of deep vessels of proximal lower extremity (HCC) 07/16/2020  . Long term (current) use of anticoagulants 07/16/2020  . Cellulitis of foot 01/15/2020  . Critical ischemia of extremity with history of revascularization of same extremity (HCC) 01/15/2020  . Leukocytosis 01/15/2020  . CKD (chronic  kidney disease), stage III (HCC) 01/15/2020  . Acute on chronic systolic CHF (congestive heart failure) (HCC) 05/04/2019  . Mild renal insufficiency 05/04/2019  . LFT elevation 05/04/2019  . PAD (peripheral artery disease) (HCC) 08/21/2018  . PVD (peripheral vascular disease) (HCC) 07/18/2018  . 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 CAD (coronary artery disease) 06/05/2018  . Pulmonary edema 06/03/2018  . DKA, type 2 (HCC) 06/03/2018  . Diabetic acidosis without coma (HCC)   . Sebaceous cyst 08/23/2016  . Acute pain of right knee 08/23/2016  . Hidradenitis 08/23/2016  . Right hip pain 08/14/2014  . Diabetes mellitus type II, non insulin dependent (HCC) 11/27/2013  . Arthritis 11/27/2013  . Neuropathy in diabetes (HCC) 11/27/2013   Past Medical History:  Diagnosis Date  . Acute pulmonary edema (HCC) 06/05/2018  . Arthritis   . COPD (chronic obstructive pulmonary disease) (HCC)    " beginning stages " per pt  . Coronary artery disease   . Diabetes mellitus without complication (HCC)    type 2  . Diabetic neuropathy (HCC)   . Diabetic neuropathy (HCC)   . Emphysema/COPD (HCC)    " beginning stages"  . GERD (gastroesophageal reflux  disease)   . HLD (hyperlipidemia)   . HOH (hard of hearing)    no hearing aids  . Myocardial infarction (HCC)   . Peripheral vascular disease (HCC)      Allergies as of 08/07/2020      Reactions   Lisinopril Other (See Comments)   Syncope   Codeine Rash      Medication List    STOP taking these medications   sulfamethoxazole-trimethoprim 800-160 MG tablet Commonly known as: BACTRIM DS   warfarin 5 MG tablet Commonly known as: Coumadin     TAKE these medications   aspirin 81 MG EC tablet Take 1 tablet (81 mg total) by mouth daily at 6 (six) AM. Swallow whole.   atorvastatin 80 MG tablet Commonly known as: LIPITOR Take 1 tablet (80 mg total) by mouth at bedtime. What changed:   medication strength  how much to take   carvedilol 6.25 MG  tablet Commonly known as: COREG Take 1 tablet (6.25 mg total) by mouth 2 (two) times daily with a meal.   clopidogrel 75 MG tablet Commonly known as: PLAVIX Take 1 tablet (75 mg total) by mouth daily.   furosemide 40 MG tablet Commonly known as: LASIX Take 1 tablet (40 mg total) by mouth daily.   glipiZIDE 10 MG tablet Commonly known as: GLUCOTROL Take 1 tablet (10 mg total) by mouth 2 (two) times daily before a meal.   metFORMIN 1000 MG tablet Commonly known as: GLUCOPHAGE Take 1 tablet (1,000 mg total) by mouth 2 (two) times daily with a meal.   oxyCODONE-acetaminophen 5-325 MG tablet Commonly known as: PERCOCET/ROXICET Take 1 tablet by mouth every 6 (six) hours as needed for moderate pain or severe pain.   potassium chloride SA 20 MEQ tablet Commonly known as: KLOR-CON Take 1 tablet (20 mEq total) by mouth daily.            Durable Medical Equipment  (From admission, onward)         Start     Ordered   08/06/20 0841  For home use only DME Bedside commode  Once       Question:  Patient needs a bedside commode to treat with the following condition  Answer:  Post-operative state   08/06/20 0841          Discharge Instructions: [facility] Discharge instructions Lower Extremity Bypass Surgery  Please refer to the following instruction for your post-procedure care. Your surgeon or physician assistant will discuss any changes with you.  Activity  You are encouraged to walk as much as you can. You can slowly return to normal activities during the month after your surgery. Avoid strenuous activity and heavy lifting until your doctor tells you it's OK. Avoid activities such as vacuuming or swinging a golf club. Do not drive until your doctor give the OK and you are no longer taking prescription pain medications. It is also normal to have difficulty with sleep habits, eating and bowel movement after surgery. These will go away with  time.  Bathing/Showering  You may shower after you go home. Do not soak in a bathtub, hot tub, or swim until the incision heals completely.  Incision Care  Clean your incision with mild soap and water. Shower every day. Pat the area dry with a clean towel. You do not need a bandage unless otherwise instructed. Do not apply any ointments or creams to your incision. If you have open wounds you will be  instructed how to care for them or a visiting nurse may be arranged for you. If you have staples or sutures along your incision they will be removed at your post-op appointment. You may have skin glue on your incision. Do not peel it off. It will come off on its own in about one week.  Wash the groin wound with soap and water daily and pat dry. (No tub bath-only shower)  Then put a dry gauze or washcloth in the groin to keep this area dry to help prevent wound infection.  Do this daily and as needed.  Do not use Vaseline or neosporin on your incisions.  Only use soap and water on your incisions and then protect and keep dry.  Diet  Resume your normal diet. There are no special food restrictions following this procedure. A low fat/ low cholesterol diet is recommended for all patients with vascular disease. In order to heal from your surgery, it is CRITICAL to get adequate nutrition. Your body requires vitamins, minerals, and protein. Vegetables are the best source of vitamins and minerals. Vegetables also provide the perfect balance of protein. Processed food has little nutritional value, so try to avoid this.  Medications  Resume taking all your medications unless your doctor or Physician Assistant tells you not to. If your incision is causing pain, you may take over-the-counter pain relievers such as acetaminophen (Tylenol). If you were prescribed a stronger pain medication, please aware these medication can cause nausea and constipation. Prevent nausea by taking the medication with a snack or meal.  Avoid constipation by drinking plenty of fluids and eating foods with high amount of fiber, such as fruits, vegetables, and grains. Take Colace 100 mg (an over-the-counter stool softener) twice a day as needed for constipation.  Do not take Tylenol if you are taking prescription pain medications.  Follow Up  Our office will schedule a follow up appointment 2-3 weeks following discharge.  Please call us immediately for any of the following conditions  .Severe or worsening pain in your legs or feet while at rest or while walking .Increase pain, redness, warmth, or drainage (pus) from your incision site(s) . Fever of 101 degree or higher . The swelling in your leg with the bypass suddenly worsens and becomes more painful than when you were in the hospital . If you have been instructed to feel your graft pulse then you should do so every day. If you can no longer feel this pulse, call the office immediately. Not all patients are given this instruction. .  Leg swelling is common after leg bypass surgery.  The swelling should improve over a few months following surgery. To improve the swelling, you may elevate your legs above the level of your heart while you are sitting or resting. Your surgeon or physician assistant may ask you to apply an ACE wrap or wear compression (TED) stockings to help to reduce swelling.  Reduce your risk of vascular disease  Stop smoking. If you would like help call [facility] at 1-800-QUIT-NOW (913 231 7870) or [place] at [phone].  . Manage your cholesterol . Maintain a desired weight . Control your diabetes weight . Control your diabetes . Keep your blood pressure down .  If you have any questions, please call the office at [phone]   Prescriptions given: 1.  Acetaminophen- hydrocodone 5-325 mg #20 No Refill 2.  Plavix 75 mg #30 take one tablet daily 3 refills 3. Aspirin 81 mg #30 take one tablet daily 3 refills  Disposition: home  Patient's  condition: is Good  Follow up: 1. Dr. Chestine Spore in 3 weeks   Nathanial Rancher, [place] [facility] [phone] 08/07/2020  7:43 AM  - For VQI Registry use ---   Post-op:  Wound infection: No  Graft infection: No  Transfusion: No    If yes, 0 units given New Arrhythmia: No Ipsilateral amputation: No,  Minor,  BKA,  AKA Discharge patency:  Primary,  Primary assisted, Arly.Keller ] Secondary,  Occluded Patency judged by:  Dopper only,  Palpable graft pulse,  Palpable distal pulse,  ABI inc. > 0.15,  Duplex D/C Ambulatory Status: Ambulatory  Complications: MI: No,  Troponin only,  EKG or Clinical CHF: No Resp failure:No,  Pneumonia,  Ventilator Chg in renal function: No,  Inc. Cr > 0.5,  Temp. Dialysis,   Permanent dialysis Stroke: No,  Minor,  Major Return to OR: No  Reason for return to OR:  Bleeding,  Infection,  Thrombosis,  Revision  Discharge medications: Statin use:  yes ASA use:  yes Plavix use:  yes Beta blocker use: yes CCB use:  Yes ACEI use:   no ARB use:  no Coumadin use: no

## 2020-08-07 NOTE — Progress Notes (Signed)
Pt discharged home. Telemetry box removed. IVs were found to be removed on assessment by staff. Pt stated that he removed them himself. Sites clean, dry, and intact. Pt received discharge instructions and all questions were answered. Pt received supplies for dressing changes. Pt also received meds from the transitional care pharmacy. Pt left with all of his belongings. Pt left via wheelchair and was accompanied by a Ferne Coe.   -Ardeen Jourdain BSN, RN

## 2020-08-07 NOTE — Progress Notes (Addendum)
Progress Note    08/07/2020 7:24 AM 3 Days Post-Op  Subjective: no complaints. Wants to go home   Vitals:   08/06/20 2043 08/07/20 0023  BP: (!) 139/59 113/62  Pulse: 84 80  Resp: 20 16  Temp: 98.5 F (36.9 C) 99.2 F (37.3 C)  SpO2: 97% 94%   Physical Exam: Cardiac:  Regular  Lungs:  Non labored Incisions: left femoral incision healing well. Wet to dry dressings applied. Left popliteal incision c/d/i. Left 1st toe partial toe amputation intact and healing well Extremities: well perfused and warm. 2+ femoral pulses. Brisk left PT/ DP Neurologic: alert and oriented  CBC    Component Value Date/Time   WBC 10.1 08/05/2020 0524   RBC 3.14 (L) 08/05/2020 0524   HGB 9.0 (L) 08/05/2020 0524   HCT 28.7 (L) 08/05/2020 0524   PLT 299 08/05/2020 0524   MCV 91.4 08/05/2020 0524   MCH 28.7 08/05/2020 0524   MCHC 31.4 08/05/2020 0524   RDW 14.1 08/05/2020 0524   LYMPHSABS 1.5 01/15/2020 0750   MONOABS 1.2 (H) 01/15/2020 0750   EOSABS 0.2 01/15/2020 0750   BASOSABS 0.1 01/15/2020 0750    BMET    Component Value Date/Time   NA 134 (L) 08/06/2020 0615   NA 137 06/27/2020 1216   K 4.0 08/06/2020 0615   CL 104 08/06/2020 0615   CO2 20 (L) 08/06/2020 0615   GLUCOSE 156 (H) 08/06/2020 0615   BUN 21 08/06/2020 0615   BUN 17 06/27/2020 1216   CREATININE 1.42 (H) 08/06/2020 0615   CREATININE 0.81 08/14/2014 1126   CALCIUM 8.1 (L) 08/06/2020 0615   GFRNONAA 53 (L) 08/06/2020 0615   GFRNONAA >89 08/14/2014 1126   GFRAA 58 (L) 08/05/2020 0524   GFRAA >89 08/14/2014 1126    INR    Component Value Date/Time   INR 1.1 08/07/2020 0224     Intake/Output Summary (Last 24 hours) at 08/07/2020 0724 Last data filed at 08/06/2020 2200 Gross per 24 hour  Intake 741.33 ml  Output 2180 ml  Net -1438.67 ml     Assessment/Plan:  64 y.o. male is s/p post thrombectomy of left common femoral to posterior tibial composite bypass with angioplasty of in-stent stenosis of left iliac  stent and then new distal target onto the BK pop after patch angioplasty of the BK pop TP trunk.Partial left great toe amputation as well. 2 Days Post-Op 3 Days Post-Op. Left leg well perfused with brisk doppler PT and DP signals. Left partial toe amputation looks good. Left groin patient will continue wet to dry dressings. Pain well controlled. Mobilizing without difficulty. Patient has Darco shoe. He will discharge home on aspirin and Plavix. PT/ OT no follow up. Patient refusing HH RN. Stable for discharge home today   Robert Sanchez, [place] [facility] [phone] 08/07/2020 7:24 AM   I have seen and evaluated the patient. I agree with the PA note as documented above.  64 year old male status post thrombectomy of left common femoral to PT composite bypass with new distal bypass onto the BK pop/TP trunk after patch angioplasty of the BK pop/TP trunk as well as left iliac angioplasty.  He has excellent Doppler signals.  His below-knee popliteal incision looks good.  Partial toe amp looks good.  Plan discharge today with aspirin Plavix.  He is refusing home health.  Will arrange wet-to-dry dressings to the left groin and have him follow-up in 3 weeks for suture removal and toe check.  Robert Sanchez.  Robert Abbott, MD [facility] [place] Office: [phone]

## 2020-08-07 NOTE — Discharge Instructions (Signed)
[facility]  Discharge instructions  Lower Extremity Bypass Surgery  Please refer to the following instruction for your post-procedure care. Your surgeon or physician assistant will discuss any changes with you.  Activity  You are encouraged to walk as much as you can. You can slowly return to normal activities during the month after your surgery. Avoid strenuous activity and heavy lifting until your doctor tells you it's OK. Avoid activities such as vacuuming or swinging a golf club. Do not drive until your doctor give the OK and you are no longer taking prescription pain medications. It is also normal to have difficulty with sleep habits, eating and bowel movement after surgery. These will go away with time.  Bathing/Showering  You may shower after you go home. Do not soak in a bathtub, hot tub, or swim until the incision heals completely.  Incision Care  Clean your incision with mild soap and water. Shower every day. Pat the area dry with a clean towel. You do not need a bandage unless otherwise instructed. Do not apply any ointments or creams to your incision. If you have open wounds you will be instructed how to care for them or a visiting nurse may be arranged for you. If you have staples or sutures along your incision they will be removed at your post-op appointment. You may have skin glue on your incision. Do not peel it off. It will come off on its own in about one week. If you have a great deal of moisture in your groin, use a gauze help keep this area dry.  Clean left groin incision with mild soap and water. Pat area dry. Apply wet- to dry dressing to left groin once daily. Left leg incision and left toe amputation sites with sutures clean with mild soap and water and pat dry. You do not have to keep these areas covered. You may cover left big toe with gauze if you want to keep it protected  Diet  Resume your normal diet. There are no special  food restrictions following this procedure. A low fat/ low cholesterol diet is recommended for all patients with vascular disease. In order to heal from your surgery, it is CRITICAL to get adequate nutrition. Your body requires vitamins, minerals, and protein. Vegetables are the best source of vitamins and minerals. Vegetables also provide the perfect balance of protein. Processed food has little nutritional value, so try to avoid this.  Medications  Resume taking all your medications unless your doctor or nurse practitioner tells you not to. If your incision is causing pain, you may take over-the-counter pain relievers such as acetaminophen (Tylenol). If you were prescribed a stronger pain medication, please aware these medication can cause nausea and constipation. Prevent nausea by taking the medication with a snack or meal. Avoid constipation by drinking plenty of fluids and eating foods with high amount of fiber, such as fruits, vegetables, and grains. Take Colase 100 mg (an over-the-counter stool softener) twice a day as needed for constipation.  Do not take Tylenol if you are taking prescription pain medications.  Follow Up  Our office will schedule a follow up appointment 2-3 weeks following discharge.  Please call us immediately for any of the following conditions  Severe or worsening pain in your legs or feet while at rest or while walking Increase pain, redness, warmth, or drainage (pus) from your incision site(s) Fever of 101 degree or higher The swelling in your leg with the bypass  suddenly worsens and becomes more painful than when you were in the hospital If you have been instructed to feel your graft pulse then you should do so every day. If you can no longer feel this pulse, call the office immediately. Not all patients are given this instruction.  Leg swelling is common after leg bypass surgery.  The swelling should improve over a few months following surgery. To improve the  swelling, you may elevate your legs above the level of your heart while you are sitting or resting. Your surgeon or physician assistant may ask you to apply an ACE wrap or wear compression (TED) stockings to help to reduce swelling.  Reduce your risk of vascular disease  Stop smoking. If you would like help call [facility] at 1-800-QUIT-NOW (732-028-6171) or  at 936-147-5230.  Manage your cholesterol Maintain a desired weight Control your diabetes weight Control your diabetes Keep your blood pressure down  If you have any questions, please call the office at [phone]

## 2020-08-08 ENCOUNTER — Telehealth (HOSPITAL_COMMUNITY): Payer: Self-pay

## 2020-08-08 NOTE — Telephone Encounter (Signed)
All STD forms and FMLA was faxed to [facility] [employer] per patients request.   Robert Sanchez

## 2020-08-19 ENCOUNTER — Encounter: Payer: Self-pay | Admitting: Vascular Surgery

## 2020-08-19 ENCOUNTER — Encounter (HOSPITAL_COMMUNITY): Payer: Self-pay

## 2020-08-19 ENCOUNTER — Ambulatory Visit (HOSPITAL_COMMUNITY): Payer: MEDICAID

## 2020-08-26 ENCOUNTER — Ambulatory Visit (INDEPENDENT_AMBULATORY_CARE_PROVIDER_SITE_OTHER)
Admission: RE | Admit: 2020-08-26 | Discharge: 2020-08-26 | Disposition: A | Discharge status: Home / Self Care | Payer: Self-pay | Source: Ambulatory Visit | Attending: Vascular Surgery | Admitting: Vascular Surgery

## 2020-08-26 ENCOUNTER — Ambulatory Visit (INDEPENDENT_AMBULATORY_CARE_PROVIDER_SITE_OTHER): Payer: Self-pay | Admitting: Vascular Surgery

## 2020-08-26 ENCOUNTER — Ambulatory Visit (HOSPITAL_COMMUNITY)
Admission: RE | Admit: 2020-08-26 | Discharge: 2020-08-26 | Disposition: A | Discharge status: Home / Self Care | Payer: Self-pay | Source: Ambulatory Visit | Attending: Vascular Surgery | Admitting: Vascular Surgery

## 2020-08-26 ENCOUNTER — Other Ambulatory Visit: Payer: Self-pay

## 2020-08-26 ENCOUNTER — Encounter: Payer: Self-pay | Admitting: Vascular Surgery

## 2020-08-26 VITALS — BP 144/68 | HR 82 | Temp 97.7°F | Resp 18 | Ht 69.0 in | Wt 220.0 lb

## 2020-08-26 DIAGNOSIS — I739 Peripheral vascular disease, unspecified: Secondary | ICD-10-CM

## 2020-08-26 NOTE — Progress Notes (Signed)
Patient name: Robert Sanchez MRN: [id number] DOB: September 28, 1956 Sex: male  REASON FOR VISIT: Post-op check  HPI: Robert Sanchez is a 64 y.o. male with history of diabetes, coronary artery disease, peripheral vascular disease, tobacco abuse presents for postop check after recent redo bypass in the left leg.  He has a somewhat complex history in the left leg. He initially underwent left common femoral to PT bypass with reversed saphenous vein in 2019 for CLI with rest pain.Ultimately he presented to the hospital earlier this yearin March 2021with an occluded bypass and underwent thrombolysis and subsequent percutaneous mechanical thrombectomy with angioplasty of the bypass and PT on 01/16/20- 01/17/2020. Ultimately his bypass was salvaged and he was discharged on Eliquis. During this event he did develop necrotic left great toe that we have been watching. He then recently presented again and his bypass had occluded a second time in September2021and underwent second attempt thrombolysis that was unsuccessful. Ultimately during an attempted thrombolysis we took him back he developed thrombus in the common femoral and iliac and required thrombectomy and ultimately we performed a left common femoral to PT composite bypass using the distal vein on the PT since this appeared to be healthy. This then thrombosed immediately after hospital discharge.  Most recently on 08/04/2020 he underwent thrombectomy of the left common femoral to PT composite bypass.  We performed a left retrograde left external iliac angioplasty of an external iliac artery stent.  We then put a bovine patch on the below-knee popliteal artery TP trunk and revised the bypass to the below-knee popliteal artery TP trunk with PTFE.Marland Kitchen  He also got a partial left toe amp at this time.  States his foot feels much better.  Feels the toe amputation is healing.  On aspirin plavix.  No immediate concerns.  Past Medical History:  Diagnosis  Date   Acute pulmonary edema (HCC) 06/05/2018   Arthritis    COPD (chronic obstructive pulmonary disease) (HCC)    " beginning stages " per pt   Coronary artery disease    Diabetes mellitus without complication (HCC)    type 2   Diabetic neuropathy (HCC)    Diabetic neuropathy (HCC)    Emphysema/COPD (HCC)    " beginning stages"   GERD (gastroesophageal reflux disease)    HLD (hyperlipidemia)    HOH (hard of hearing)    no hearing aids   Myocardial infarction [facility])    Peripheral vascular disease (HCC)     Past Surgical History:  Procedure Laterality Date   ABDOMINAL AORTOGRAM W/LOWER EXTREMITY N/A 06/21/2018   Procedure: ABDOMINAL AORTOGRAM W/LOWER EXTREMITY;  Surgeon: Cephus Shelling, MD;  [facility]: [facility];  Service: Cardiovascular;  Laterality: N/A;   ABDOMINAL AORTOGRAM W/LOWER EXTREMITY Left 01/16/2020   Procedure: ABDOMINAL AORTOGRAM W/LOWER EXTREMITY;  Surgeon: Cephus Shelling, MD;  Location: [facility];  Service: Cardiovascular;  Laterality: Left;   ABDOMINAL AORTOGRAM W/LOWER EXTREMITY N/A 07/09/2020   Procedure: ABDOMINAL AORTOGRAM W/LOWER EXTREMITY;  Surgeon: Cephus Shelling, MD;  Location: [facility];  Service: Cardiovascular;  Laterality: N/A;  TPA infusion   AMPUTATION Left 08/04/2020   Procedure: LEFT PARTIAL GREAT TOE AMPUTATION;  Surgeon: Cephus Shelling, MD;  Location: [facility] OR;  Service: Vascular;  Laterality: Left;   APPLICATION OF WOUND VAC Left 08/04/2020   Procedure: LEFT GROIN DEBRIDEMENT WITH APPLICATION OF WOUND VAC;  Surgeon: Cephus Shelling, MD;  Location: [facility];  Service: Vascular;  Laterality: Left;  CORONARY ARTERY BYPASS GRAFT N/A 06/12/2018   Procedure: CORONARY ARTERY BYPASS GRAFTING (CABG) x 3 WITH ENDOSCOPIC HARVESTING OF RIGHT GREATER SAPHENOUS VEIN. LIMA TO LAD. SVG TO PD. SVG TO DIAGONAL.;  Surgeon: Kerin PernaVan Trigt, Peter, MD;  [facility];  Service: Open Heart Surgery;  Laterality:  N/A;   FEMORAL-POPLITEAL BYPASS GRAFT Left 07/10/2020   Procedure: REDO EXPOSURE OF LEFT COMMON FEMORAL ARTERY LEFT COMMON FEMORAL TO POSTERIOR TIBIAL COMPOSITE BYPASS GRAFT;  Surgeon: Cephus Shellinglark, Kiyani Jernigan J, MD;  Location: [facility];  Service: Vascular;  Laterality: Left;   FEMORAL-TIBIAL BYPASS GRAFT Left 08/21/2018   Procedure: BYPASS GRAFT LEFT FEMORAL TO POSTERIOR TIBIAL ARTERY USING LEFT REVERSED GREAT SAPHENOUS VEIN;  Surgeon: Cephus Shellinglark, Jaaziah Schulke J, MD;  [facility]: [facility];  Service: Vascular;  Laterality: Left;   FEMORAL-TIBIAL BYPASS GRAFT Left 08/04/2020   Procedure: REVISION FEMORAL-DISTAL BYPASS WITH BIOLOGICAL BOVINE PATCH;  Surgeon: Cephus Shellinglark, Dawnyel Leven J, MD;  [facility];  Service: Vascular;  Laterality: Left;   KNEE ARTHROSCOPY     LOWER EXTREMITY ANGIOGRAM Left 08/04/2020   Procedure: LEFT ILIAC ANGIOGRAM, ANGIOPLASTY OF LEFT ILIAC STENT WITH DRUG COATED BALLOON;  Surgeon: Cephus Shellinglark, Eathel Pajak J, MD;  [facility]: [facility];  Service: Vascular;  Laterality: Left;   LOWER EXTREMITY ANGIOGRAPHY Left 01/17/2020   Procedure: Lower Extremity Angiography;  Surgeon: Cephus Shellinglark, Adolf Ormiston J, MD;  [facility];  Service: Cardiovascular;  Laterality: Left;   PERIPHERAL VASCULAR BALLOON ANGIOPLASTY Left 01/17/2020   Procedure: PERIPHERAL VASCULAR BALLOON ANGIOPLASTY;  Surgeon: Cephus Shellinglark, Shealee Yordy J, MD;  [facility];  Service: Cardiovascular;  Laterality: Left;  pt   PERIPHERAL VASCULAR INTERVENTION Left 06/21/2018   Procedure: PERIPHERAL VASCULAR INTERVENTION;  Surgeon: Cephus Shellinglark, Jesicca Dipierro J, MD;  [facility];  Service: Cardiovascular;  Laterality: Left;   PERIPHERAL VASCULAR THROMBECTOMY Left 01/16/2020   Procedure: PERIPHERAL VASCULAR THROMBECTOMY;  Surgeon: Cephus Shellinglark, Cris Gibby J, MD;  [facility];  Service: Cardiovascular;  Laterality: Left;  Lytic Catheter Placement left fem-pop bypass   PERIPHERAL VASCULAR THROMBECTOMY Left 01/17/2020    Procedure: PERIPHERAL VASCULAR THROMBECTOMY;  Surgeon: Cephus Shellinglark, Deven Audi J, MD;  [facility];  Service: Cardiovascular;  Laterality: Left;  fem-pt bypass   PERIPHERAL VASCULAR THROMBECTOMY Left 07/10/2020   Procedure: lysis recheck;  Surgeon: Cephus Shellinglark, Stevin Bielinski J, MD;  [facility];  Service: Cardiovascular;  Laterality: Left;   RIGHT/LEFT HEART CATH AND CORONARY ANGIOGRAPHY N/A 06/05/2018   Procedure: RIGHT/LEFT HEART CATH AND CORONARY ANGIOGRAPHY;  Surgeon: Orpah CobbKadakia, Ajay, MD;  [facility];  Service: Cardiovascular;  Laterality: N/A;   SPINE SURGERY     TEE WITHOUT CARDIOVERSION N/A 06/12/2018   Procedure: TRANSESOPHAGEAL ECHOCARDIOGRAM (TEE);  Surgeon: Donata ClayVan Trigt, Theron AristaPeter, MD;  [facility];  Service: Open Heart Surgery;  Laterality: N/A;   teeth extractions     THROMBECTOMY FEMORAL ARTERY Left 08/04/2020   Procedure: THROMBECTOMY OF LEFT FEMORAL TP COMPOSTITE BYPASS;  Surgeon: Cephus Shellinglark, Rolfe Hartsell J, MD;  [facility];  Service: Vascular;  Laterality: Left;   THROMBECTOMY ILIAC ARTERY Left 07/10/2020   Procedure: THROMBECTOMY OF LEFT EXTERNAL ILIAC AND LEFT COMMON FEMORAL AND LEFT POSTERIOR TIBIAL ARTERIES;  Surgeon: Cephus Shellinglark, Rokhaya Quinn J, MD;  [facility];  Service: Vascular;  Laterality: Left;   VEIN HARVEST Left 08/21/2018   Procedure: VEIN HARVEST LEFT GREAT SAPHENOUS VEIN;  Surgeon: Cephus Shellinglark, Keani Gotcher J, MD;  [facility];  Service: Vascular;  Laterality: Left;    Family History  Problem Relation Age of Onset   Diabetes Mother    Lung disease Mother    Cancer Father    Heart disease Father     SOCIAL HISTORY: Social History   Tobacco Use   Smoking status: Former Smoker    Packs/day: 1.00    Years: 40.00    Pack years: 40.00    Types: Cigarettes    Quit date: 06/02/2018    Years since quitting: 2.2   Smokeless tobacco: Never Used  Substance Use Topics   Alcohol use: Yes    Comment: occasional    Allergies    Allergen Reactions   Lisinopril Other (See Comments)    Syncope   Codeine Rash    Current Outpatient Medications  Medication Sig Dispense Refill   aspirin EC 81 MG EC tablet Take 1 tablet (81 mg total) by mouth daily at 6 (six) AM. Swallow whole. 30 tablet 11   atorvastatin (LIPITOR) 80 MG tablet Take 1 tablet (80 mg total) by mouth at bedtime. 90 tablet 4   carvedilol (COREG) 6.25 MG tablet Take 1 tablet (6.25 mg total) by mouth 2 (two) times daily with a meal. 60 tablet 5   clopidogrel (PLAVIX) 75 MG tablet Take 1 tablet (75 mg total) by mouth daily. 30 tablet 2   furosemide (LASIX) 40 MG tablet Take 1 tablet (40 mg total) by mouth daily. 30 tablet 5   glipiZIDE (GLUCOTROL) 10 MG tablet Take 1 tablet (10 mg total) by mouth 2 (two) times daily before a meal. 180 tablet 3   metFORMIN (GLUCOPHAGE) 1000 MG tablet Take 1 tablet (1,000 mg total) by mouth 2 (two) times daily with a meal. 180 tablet 3   potassium chloride SA (KLOR-CON) 20 MEQ tablet Take 1 tablet (20 mEq total) by mouth daily. 30 tablet 5   oxyCODONE-acetaminophen (PERCOCET/ROXICET) 5-325 MG tablet Take 1 tablet by mouth every 6 (six) hours as needed for moderate pain or severe pain. (Patient not taking: Reported on 08/26/2020) 20 tablet 0   No current facility-administered medications for this visit.    REVIEW OF SYSTEMS:  [X]  denotes positive finding, [ ]  denotes negative finding Cardiac  Comments:  Chest pain or chest pressure:    Shortness of breath upon exertion:    Short of breath when lying flat:    Irregular heart rhythm:        Vascular    Pain in calf, thigh, or hip brought on by ambulation:    Pain in feet at night that wakes you up from your sleep:     Blood clot in your veins:    Leg swelling:         Pulmonary    Oxygen at home:    Productive cough:     Wheezing:         Neurologic    Sudden weakness in arms or legs:     Sudden numbness in arms or legs:     Sudden onset of difficulty  speaking or slurred speech:    Temporary loss of vision in one eye:     Problems with dizziness:         Gastrointestinal    Blood in stool:     Vomited blood:         Genitourinary    Burning when urinating:     Blood in urine:        Psychiatric    Major depression:         Hematologic  Bleeding problems:    Problems with blood clotting too easily:        Skin    Rashes or ulcers:        Constitutional    Fever or chills:      PHYSICAL EXAM: Vitals:   08/26/20 0914  BP: (!) 144/68  Pulse: 82  Resp: 18  Temp: 97.7 F (36.5 C)  TempSrc: Temporal  SpO2: 98%  Weight: 220 lb (99.8 kg)  Height: 5\' 9"  (1.753 m)    GENERAL: The patient is a well-nourished male, in no acute distress. The vital signs are documented above. CARDIAC: There is a regular rate and rhythm.  VASCULAR:  Left groin incision nearly completely healed Left below-knee popliteal incision looks good with nylon sutures Partial left great toe amp looks good with nylon sutures as well.  DATA:   Left leg duplex today confirms the bypass is patent with good velocities and no evidence of stenosis.  Left leg ABI 1.07.  Assessment/Plan:  64 year old male with complex history that most recently underwent thrombectomy of his left common femoral to PT composite bypass with retrograde iliac angioplasty and then revision of the distal bypass onto the below-knee popliteal artery/TP trunk with a bovine pericardial patch angioplasty.  Bypass is patent on duplex today.  He has very brisk Doppler signals at the ankle.  We will remove his sutures from the below-knee popliteal incision and toe amputation today.  I have asked that he put Betadine paint on the toe since he still has a small scab.  I have him follow-up in 1 month to ensure that the toe completely heals for wound check.   70, MD [facility] [place] Office: [phone]

## 2020-09-02 ENCOUNTER — Ambulatory Visit (HOSPITAL_COMMUNITY): Payer: Self-pay

## 2020-09-02 ENCOUNTER — Encounter (HOSPITAL_COMMUNITY): Payer: Self-pay

## 2020-09-02 ENCOUNTER — Encounter: Payer: Self-pay | Admitting: Vascular Surgery

## 2020-09-23 ENCOUNTER — Encounter: Payer: Self-pay | Admitting: Vascular Surgery

## 2020-09-23 ENCOUNTER — Other Ambulatory Visit: Payer: Self-pay

## 2020-09-23 ENCOUNTER — Ambulatory Visit (INDEPENDENT_AMBULATORY_CARE_PROVIDER_SITE_OTHER): Payer: Self-pay | Admitting: Vascular Surgery

## 2020-09-23 VITALS — BP 129/69 | HR 82 | Temp 97.6°F | Resp 18 | Ht 69.0 in | Wt 227.0 lb

## 2020-09-23 DIAGNOSIS — I739 Peripheral vascular disease, unspecified: Secondary | ICD-10-CM

## 2020-09-23 NOTE — Progress Notes (Signed)
Patient name: Robert Sanchez MRN: [id number] DOB: 10/01/1956 Sex: male  REASON FOR VISIT: 1 month follow-up for wound check of left toe amp  HPI: Robert Sanchez is a 64 y.o. male with history of diabetes, coronary artery disease, peripheral vascular disease, tobacco abuse presents for 1 month follow-up and wound check of left great toe amp after recent redo bypass in the left leg.  He has a somewhat complex history in the left leg. He initially underwent left common femoral to PT bypass with reversed saphenous vein in 2019 for CLI with rest pain.Ultimately he presented to the hospital earlier this yearin March 2021with an occluded bypass and underwent thrombolysis and subsequent percutaneous mechanical thrombectomy with angioplasty of the bypass and PT on 01/16/20- 01/17/2020. Ultimately his bypass was salvaged and he was discharged on Eliquis. During this event he did develop necrotic left great toe that we have been watching. He then recently presented again and his bypass had occluded a second time in September2021and underwent second attempt thrombolysis that was unsuccessful. Ultimately during an attempted thrombolysis we took him back he developed thrombus in the common femoral and iliac and required thrombectomy and ultimately we performed a left common femoral to PT composite bypass using the distal vein on the PT since this appeared to be healthy. This then thrombosed immediately after hospital discharge.  Most recently on 08/04/2020 he underwent thrombectomy of the left common femoral to PT composite bypass.  We performed a left retrograde left external iliac angioplasty of an external iliac artery stent.  We then put a bovine patch on the below-knee popliteal artery TP trunk and revised the bypass to the below-knee popliteal artery TP trunk with PTFE.Marland Kitchen  He also got a partial left toe amp at this time.  Toe amp now completely healed.  On aspirin plavix.  No immediate concerns.   States his foot feels much better.  Past Medical History:  Diagnosis Date  . Acute pulmonary edema (HCC) 06/05/2018  . Arthritis   . COPD (chronic obstructive pulmonary disease) (HCC)    " beginning stages " per pt  . Coronary artery disease   . Diabetes mellitus without complication (HCC)    type 2  . Diabetic neuropathy (HCC)   . Diabetic neuropathy (HCC)   . Emphysema/COPD (HCC)    " beginning stages"  . GERD (gastroesophageal reflux disease)   . HLD (hyperlipidemia)   . HOH (hard of hearing)    no hearing aids  . Myocardial infarction (HCC)   . Peripheral vascular disease [facility])     Past Surgical History:  Procedure Laterality Date  . ABDOMINAL AORTOGRAM W/LOWER EXTREMITY N/A 06/21/2018   Procedure: ABDOMINAL AORTOGRAM W/LOWER EXTREMITY;  Surgeon: Cephus Shelling, MD;  [facility]: [facility];  Service: Cardiovascular;  Laterality: N/A;  . ABDOMINAL AORTOGRAM W/LOWER EXTREMITY Left 01/16/2020   Procedure: ABDOMINAL AORTOGRAM W/LOWER EXTREMITY;  Surgeon: Cephus Shelling, MD;  Location: [facility];  Service: Cardiovascular;  Laterality: Left;  . ABDOMINAL AORTOGRAM W/LOWER EXTREMITY N/A 07/09/2020   Procedure: ABDOMINAL AORTOGRAM W/LOWER EXTREMITY;  Surgeon: Cephus Shelling, MD;  Location: [facility];  Service: Cardiovascular;  Laterality: N/A;  TPA infusion  . AMPUTATION Left 08/04/2020   Procedure: LEFT PARTIAL GREAT TOE AMPUTATION;  Surgeon: Cephus Shelling, MD;  [facility];  Service: Vascular;  Laterality: Left;  . APPLICATION OF WOUND VAC Left 08/04/2020   Procedure: LEFT GROIN DEBRIDEMENT WITH APPLICATION OF WOUND VAC;  Surgeon:  Cephus Shellinglark, Shanina Kepple J, MD;  [facility];  Service: Vascular;  Laterality: Left;  . CORONARY ARTERY BYPASS GRAFT N/A 06/12/2018   Procedure: CORONARY ARTERY BYPASS GRAFTING (CABG) x 3 WITH ENDOSCOPIC HARVESTING OF RIGHT GREATER SAPHENOUS VEIN. LIMA TO LAD. SVG TO PD. SVG TO DIAGONAL.;  Surgeon: Kerin PernaVan Trigt,  Peter, MD;  [facility];  Service: Open Heart Surgery;  Laterality: N/A;  . FEMORAL-POPLITEAL BYPASS GRAFT Left 07/10/2020   Procedure: REDO EXPOSURE OF LEFT COMMON FEMORAL ARTERY LEFT COMMON FEMORAL TO POSTERIOR TIBIAL COMPOSITE BYPASS GRAFT;  Surgeon: Cephus Shellinglark, Xavious Sharrar J, MD;  Location: [facility];  Service: Vascular;  Laterality: Left;  . FEMORAL-TIBIAL BYPASS GRAFT Left 08/21/2018   Procedure: BYPASS GRAFT LEFT FEMORAL TO POSTERIOR TIBIAL ARTERY USING LEFT REVERSED GREAT SAPHENOUS VEIN;  Surgeon: Cephus Shellinglark, Dominiq Fontaine J, MD;  Location: [facility] OR;  Service: Vascular;  Laterality: Left;  . FEMORAL-TIBIAL BYPASS GRAFT Left 08/04/2020   Procedure: REVISION FEMORAL-DISTAL BYPASS WITH BIOLOGICAL BOVINE PATCH;  Surgeon: Cephus Shellinglark, Elbony Mcclimans J, MD;  [facility];  Service: Vascular;  Laterality: Left;  . KNEE ARTHROSCOPY    . LOWER EXTREMITY ANGIOGRAM Left 08/04/2020   Procedure: LEFT ILIAC ANGIOGRAM, ANGIOPLASTY OF LEFT ILIAC STENT WITH DRUG COATED BALLOON;  Surgeon: Cephus Shellinglark, Lebert Lovern J, MD;  Location: [facility];  Service: Vascular;  Laterality: Left;  . LOWER EXTREMITY ANGIOGRAPHY Left 01/17/2020   Procedure: Lower Extremity Angiography;  Surgeon: Cephus Shellinglark, Charlina Dwight J, MD;  [facility];  Service: Cardiovascular;  Laterality: Left;  . PERIPHERAL VASCULAR BALLOON ANGIOPLASTY Left 01/17/2020   Procedure: PERIPHERAL VASCULAR BALLOON ANGIOPLASTY;  Surgeon: Cephus Shellinglark, Alaiza Yau J, MD;  [facility];  Service: Cardiovascular;  Laterality: Left;  pt  . PERIPHERAL VASCULAR INTERVENTION Left 06/21/2018   Procedure: PERIPHERAL VASCULAR INTERVENTION;  Surgeon: Cephus Shellinglark, Yareni Creps J, MD;  [facility];  Service: Cardiovascular;  Laterality: Left;  . PERIPHERAL VASCULAR THROMBECTOMY Left 01/16/2020   Procedure: PERIPHERAL VASCULAR THROMBECTOMY;  Surgeon: Cephus Shellinglark, Saina Waage J, MD;  [facility];  Service: Cardiovascular;  Laterality: Left;  Lytic Catheter Placement left  fem-pop bypass  . PERIPHERAL VASCULAR THROMBECTOMY Left 01/17/2020   Procedure: PERIPHERAL VASCULAR THROMBECTOMY;  Surgeon: Cephus Shellinglark, Kitt Ledet J, MD;  [facility];  Service: Cardiovascular;  Laterality: Left;  fem-pt bypass  . PERIPHERAL VASCULAR THROMBECTOMY Left 07/10/2020   Procedure: lysis recheck;  Surgeon: Cephus Shellinglark, Ladanian Kelter J, MD;  [facility];  Service: Cardiovascular;  Laterality: Left;  . RIGHT/LEFT HEART CATH AND CORONARY ANGIOGRAPHY N/A 06/05/2018   Procedure: RIGHT/LEFT HEART CATH AND CORONARY ANGIOGRAPHY;  Surgeon: Orpah CobbKadakia, Ajay, MD;  [facility];  Service: Cardiovascular;  Laterality: N/A;  . SPINE SURGERY    . TEE WITHOUT CARDIOVERSION N/A 06/12/2018   Procedure: TRANSESOPHAGEAL ECHOCARDIOGRAM (TEE);  Surgeon: Donata ClayVan Trigt, Theron AristaPeter, MD;  [facility];  Service: Open Heart Surgery;  Laterality: N/A;  . teeth extractions    . THROMBECTOMY FEMORAL ARTERY Left 08/04/2020   Procedure: THROMBECTOMY OF LEFT FEMORAL TP COMPOSTITE BYPASS;  Surgeon: Cephus Shellinglark, Alexey Rhoads J, MD;  [facility];  Service: Vascular;  Laterality: Left;  . THROMBECTOMY ILIAC ARTERY Left 07/10/2020   Procedure: THROMBECTOMY OF LEFT EXTERNAL ILIAC AND LEFT COMMON FEMORAL AND LEFT POSTERIOR TIBIAL ARTERIES;  Surgeon: Cephus Shellinglark, Nikkolas Coomes J, MD;  [facility];  Service: Vascular;  Laterality: Left;  Marland Kitchen. VEIN HARVEST Left 08/21/2018   Procedure: VEIN HARVEST LEFT GREAT SAPHENOUS VEIN;  Surgeon: Cephus Shellinglark, Maliya Marich J, MD;  [facility];  Service: Vascular;  Laterality: Left;    Family History  Problem Relation Age of Onset  . Diabetes Mother   . Lung disease Mother   . Cancer Father   . Heart disease Father     SOCIAL HISTORY: Social History   Tobacco Use  . Smoking status: Former Smoker    Packs/day: 1.00    Years: 40.00    Pack years: 40.00    Types: Cigarettes    Quit date: 06/02/2018    Years since quitting: 2.3  . Smokeless tobacco: Never Used  Substance  Use Topics  . Alcohol use: Yes    Comment: occasional    Allergies  Allergen Reactions  . Lisinopril Other (See Comments)    Syncope  . Codeine Rash    Current Outpatient Medications  Medication Sig Dispense Refill  . aspirin EC 81 MG EC tablet Take 1 tablet (81 mg total) by mouth daily at 6 (six) AM. Swallow whole. 30 tablet 11  . atorvastatin (LIPITOR) 80 MG tablet Take 1 tablet (80 mg total) by mouth at bedtime. 90 tablet 4  . carvedilol (COREG) 6.25 MG tablet Take 1 tablet (6.25 mg total) by mouth 2 (two) times daily with a meal. 60 tablet 5  . clopidogrel (PLAVIX) 75 MG tablet Take 1 tablet (75 mg total) by mouth daily. 30 tablet 2  . furosemide (LASIX) 40 MG tablet Take 1 tablet (40 mg total) by mouth daily. 30 tablet 5  . glipiZIDE (GLUCOTROL) 10 MG tablet Take 1 tablet (10 mg total) by mouth 2 (two) times daily before a meal. 180 tablet 3  . metFORMIN (GLUCOPHAGE) 1000 MG tablet Take 1 tablet (1,000 mg total) by mouth 2 (two) times daily with a meal. 180 tablet 3  . potassium chloride SA (KLOR-CON) 20 MEQ tablet Take 1 tablet (20 mEq total) by mouth daily. 30 tablet 5  . oxyCODONE-acetaminophen (PERCOCET/ROXICET) 5-325 MG tablet Take 1 tablet by mouth every 6 (six) hours as needed for moderate pain or severe pain. (Patient not taking: Reported on 08/26/2020) 20 tablet 0   No current facility-administered medications for this visit.    REVIEW OF SYSTEMS:  [X]  denotes positive finding, [ ]  denotes negative finding Cardiac  Comments:  Chest pain or chest pressure:    Shortness of breath upon exertion:    Short of breath when lying flat:    Irregular heart rhythm:        Vascular    Pain in calf, thigh, or hip brought on by ambulation:    Pain in feet at night that wakes you up from your sleep:     Blood clot in your veins:    Leg swelling:         Pulmonary    Oxygen at home:    Productive cough:     Wheezing:         Neurologic    Sudden weakness in arms or legs:      Sudden numbness in arms or legs:     Sudden onset of difficulty speaking or slurred speech:    Temporary loss of vision in one eye:     Problems with dizziness:         Gastrointestinal    Blood in stool:     Vomited blood:         Genitourinary    Burning when urinating:     Blood in urine:        Psychiatric  Major depression:         Hematologic    Bleeding problems:    Problems with blood clotting too easily:        Skin    Rashes or ulcers:        Constitutional    Fever or chills:      PHYSICAL EXAM: Vitals:   09/23/20 0925  BP: 129/69  Pulse: 82  Resp: 18  Temp: 97.6 F (36.4 C)  TempSrc: Temporal  SpO2: 97%  Weight: 227 lb (103 kg)  Height: 5\' 9"  (1.753 m)    GENERAL: The patient is a well-nourished male, in no acute distress. The vital signs are documented above. CARDIAC: There is a regular rate and rhythm.  VASCULAR:  Left groin incision healed Left below-knee popliteal incision healed Partial left great toe amp nearly healed      DATA:   Left leg duplex today 10/26/221confirms the bypass is patent with good velocities and no evidence of stenosis.  Left leg ABI 1.07 on 08/26/20.  Assessment/Plan:  64 year old male with complex history that most recently underwent thrombectomy of his left common femoral to PT composite bypass with retrograde iliac angioplasty and then revision of the distal bypass onto the below-knee popliteal artery/TP trunk with a bovine pericardial patch angioplasty.  Continues to have very brisk doppler signals in left DP/PT.  Bypass widely patent on duplex from 08/26/20.    He can return to work from my standpoint and states he thinks he needs about 2 more weeks.  Needs to continue aspirin Plavix.  I will see him in 6 months with aortoiliac duplex and left leg arterial duplex with ABIs for ongoing surveillance.  Very pleased to see that his toe amp is healed.   08/28/20, MD [facility] [place] Office: [phone]

## 2021-01-06 ENCOUNTER — Inpatient Hospital Stay (HOSPITAL_COMMUNITY)
Admission: EM | Admit: 2021-01-06 | Discharge: 2021-01-12 | DRG: 254 | Disposition: A | Discharge status: Home / Self Care | Payer: Self-pay | Attending: Vascular Surgery | Admitting: Vascular Surgery

## 2021-01-06 ENCOUNTER — Encounter (HOSPITAL_COMMUNITY): Payer: Self-pay

## 2021-01-06 ENCOUNTER — Other Ambulatory Visit: Payer: Self-pay

## 2021-01-06 ENCOUNTER — Emergency Department (HOSPITAL_COMMUNITY): Payer: Self-pay

## 2021-01-06 DIAGNOSIS — Z7902 Long term (current) use of antithrombotics/antiplatelets: Secondary | ICD-10-CM

## 2021-01-06 DIAGNOSIS — Z888 Allergy status to other drugs, medicaments and biological substances status: Secondary | ICD-10-CM

## 2021-01-06 DIAGNOSIS — I2510 Atherosclerotic heart disease of native coronary artery without angina pectoris: Secondary | ICD-10-CM | POA: Diagnosis present

## 2021-01-06 DIAGNOSIS — I70229 Atherosclerosis of native arteries of extremities with rest pain, unspecified extremity: Secondary | ICD-10-CM

## 2021-01-06 DIAGNOSIS — E1151 Type 2 diabetes mellitus with diabetic peripheral angiopathy without gangrene: Secondary | ICD-10-CM | POA: Diagnosis present

## 2021-01-06 DIAGNOSIS — I70222 Atherosclerosis of native arteries of extremities with rest pain, left leg: Secondary | ICD-10-CM

## 2021-01-06 DIAGNOSIS — Z87891 Personal history of nicotine dependence: Secondary | ICD-10-CM

## 2021-01-06 DIAGNOSIS — E785 Hyperlipidemia, unspecified: Secondary | ICD-10-CM | POA: Diagnosis present

## 2021-01-06 DIAGNOSIS — Z79899 Other long term (current) drug therapy: Secondary | ICD-10-CM

## 2021-01-06 DIAGNOSIS — Z89412 Acquired absence of left great toe: Secondary | ICD-10-CM

## 2021-01-06 DIAGNOSIS — Z20822 Contact with and (suspected) exposure to covid-19: Secondary | ICD-10-CM | POA: Diagnosis present

## 2021-01-06 DIAGNOSIS — T82868A Thrombosis of vascular prosthetic devices, implants and grafts, initial encounter: Principal | ICD-10-CM | POA: Diagnosis present

## 2021-01-06 DIAGNOSIS — I252 Old myocardial infarction: Secondary | ICD-10-CM

## 2021-01-06 DIAGNOSIS — Z885 Allergy status to narcotic agent status: Secondary | ICD-10-CM

## 2021-01-06 DIAGNOSIS — Z7982 Long term (current) use of aspirin: Secondary | ICD-10-CM

## 2021-01-06 DIAGNOSIS — E1165 Type 2 diabetes mellitus with hyperglycemia: Secondary | ICD-10-CM | POA: Diagnosis present

## 2021-01-06 DIAGNOSIS — Z7984 Long term (current) use of oral hypoglycemic drugs: Secondary | ICD-10-CM

## 2021-01-06 DIAGNOSIS — J439 Emphysema, unspecified: Secondary | ICD-10-CM | POA: Diagnosis present

## 2021-01-06 DIAGNOSIS — E1140 Type 2 diabetes mellitus with diabetic neuropathy, unspecified: Secondary | ICD-10-CM | POA: Diagnosis present

## 2021-01-06 DIAGNOSIS — M79605 Pain in left leg: Secondary | ICD-10-CM

## 2021-01-06 DIAGNOSIS — T82898A Other specified complication of vascular prosthetic devices, implants and grafts, initial encounter: Secondary | ICD-10-CM

## 2021-01-06 DIAGNOSIS — Z9889 Other specified postprocedural states: Secondary | ICD-10-CM

## 2021-01-06 DIAGNOSIS — H9190 Unspecified hearing loss, unspecified ear: Secondary | ICD-10-CM | POA: Diagnosis present

## 2021-01-06 DIAGNOSIS — I998 Other disorder of circulatory system: Secondary | ICD-10-CM

## 2021-01-06 DIAGNOSIS — K219 Gastro-esophageal reflux disease without esophagitis: Secondary | ICD-10-CM | POA: Diagnosis present

## 2021-01-06 DIAGNOSIS — Z951 Presence of aortocoronary bypass graft: Secondary | ICD-10-CM

## 2021-01-06 LAB — CBC
HCT: 40.9 % (ref 39.0–52.0)
HCT: 41.8 % (ref 39.0–52.0)
Hemoglobin: 12.5 g/dL — ABNORMAL LOW (ref 13.0–17.0)
Hemoglobin: 13 g/dL (ref 13.0–17.0)
MCH: 27.2 pg (ref 26.0–34.0)
MCH: 27.3 pg (ref 26.0–34.0)
MCHC: 30.6 g/dL (ref 30.0–36.0)
MCHC: 31.1 g/dL (ref 30.0–36.0)
MCV: 89.1 fL (ref 80.0–100.0)
MCV: 87.6 fL (ref 80.0–100.0)
Platelets: 384 10*3/uL (ref 150–400)
Platelets: 351 10*3/uL (ref 150–400)
RBC: 4.59 MIL/uL (ref 4.22–5.81)
RBC: 4.77 MIL/uL (ref 4.22–5.81)
RDW: 15.9 % — ABNORMAL HIGH (ref 11.5–15.5)
RDW: 16 % — ABNORMAL HIGH (ref 11.5–15.5)
WBC: 12.2 10*3/uL — ABNORMAL HIGH (ref 4.0–10.5)
WBC: 10.6 10*3/uL — ABNORMAL HIGH (ref 4.0–10.5)
nRBC: 0 % (ref 0.0–0.2)
nRBC: 0 % (ref 0.0–0.2)

## 2021-01-06 LAB — COMPREHENSIVE METABOLIC PANEL
ALT: 18 U/L (ref 0–44)
AST: 16 U/L (ref 15–41)
Albumin: 3.6 g/dL (ref 3.5–5.0)
Alkaline Phosphatase: 74 U/L (ref 38–126)
Anion gap: 10 (ref 5–15)
BUN: 14 mg/dL (ref 8–23)
CO2: 21 mmol/L — ABNORMAL LOW (ref 22–32)
Calcium: 9.5 mg/dL (ref 8.9–10.3)
Chloride: 99 mmol/L (ref 98–111)
Creatinine, Ser: 1.04 mg/dL (ref 0.61–1.24)
GFR, Estimated: >60 mL/min (ref >60)
Glucose, Bld: 382 mg/dL — ABNORMAL HIGH (ref 70–99)
Potassium: 4.9 mmol/L (ref 3.5–5.1)
Sodium: 130 mmol/L — ABNORMAL LOW (ref 135–145)
Total Bilirubin: 0.8 mg/dL (ref 0.3–1.2)
Total Protein: 7.7 g/dL (ref 6.5–8.1)

## 2021-01-06 LAB — URINALYSIS, ROUTINE W REFLEX MICROSCOPIC
Bacteria, UA: NONE SEEN
Bilirubin Urine: NEGATIVE
Glucose, UA: >=500 mg/dL — AB
Ketones, ur: NEGATIVE mg/dL
Leukocytes,Ua: NEGATIVE
Nitrite: NEGATIVE
Protein, ur: NEGATIVE mg/dL
Specific Gravity, Urine: 1.02 (ref 1.005–1.030)
pH: 5 (ref 5.0–8.0)

## 2021-01-06 LAB — RESP PANEL BY RT-PCR (FLU A&B, COVID) ARPGX2
Influenza A by PCR: NEGATIVE
Influenza B by PCR: NEGATIVE
SARS Coronavirus 2 by RT PCR: NEGATIVE

## 2021-01-06 LAB — GLUCOSE, CAPILLARY
Glucose-Capillary: 269 mg/dL — ABNORMAL HIGH (ref 70–99)
Glucose-Capillary: 176 mg/dL — ABNORMAL HIGH (ref 70–99)

## 2021-01-06 LAB — PROTIME-INR
INR: 1 (ref 0.8–1.2)
Prothrombin Time: 13.2 seconds (ref 11.4–15.2)

## 2021-01-06 LAB — HEMOGLOBIN A1C
Hgb A1c MFr Bld: 12.7 % — ABNORMAL HIGH (ref 4.8–5.6)
Mean Plasma Glucose: 317.79 mg/dL

## 2021-01-06 LAB — HEPARIN LEVEL (UNFRACTIONATED): Heparin Unfractionated: <0.1 IU/mL — ABNORMAL LOW (ref 0.30–0.70)

## 2021-01-06 MED ORDER — LABETALOL HCL 5 MG/ML IV SOLN: 10.0000 mg | INTRAVENOUS | Status: DC | PRN

## 2021-01-06 MED ORDER — HEPARIN BOLUS VIA INFUSION
5500.0000 [IU] | Freq: Once | INTRAVENOUS | Status: AC
  Administered 2021-01-06: 5500 [IU] via INTRAVENOUS
  Filled 2021-01-06: qty 5500

## 2021-01-06 MED ORDER — SODIUM CHLORIDE 0.9 % IV SOLN: 250.0000 mL | INTRAVENOUS | Status: DC | PRN

## 2021-01-06 MED ORDER — ONDANSETRON HCL 4 MG/2ML IJ SOLN: 4.0000 mg | Freq: Four times a day (QID) | INTRAMUSCULAR | Status: DC | PRN

## 2021-01-06 MED ORDER — GLIPIZIDE 10 MG PO TABS
10.0000 mg | ORAL_TABLET | Freq: Two times a day (BID) | ORAL | Status: DC
  Administered 2021-01-06: 10 mg via ORAL
  Filled 2021-01-06: qty 1

## 2021-01-06 MED ORDER — SODIUM CHLORIDE 0.9% FLUSH
3.0000 mL | Freq: Two times a day (BID) | INTRAVENOUS | Status: DC
  Administered 2021-01-06 – 2021-01-07 (×3): 3 mL via INTRAVENOUS

## 2021-01-06 MED ORDER — INSULIN ASPART 100 UNIT/ML ~~LOC~~ SOLN
0.0000 [IU] | Freq: Three times a day (TID) | SUBCUTANEOUS | Status: DC
  Administered 2021-01-06 – 2021-01-07 (×2): 8 [IU] via SUBCUTANEOUS

## 2021-01-06 MED ORDER — HEPARIN (PORCINE) 25000 UT/250ML-% IV SOLN
1300.0000 [IU]/h | INTRAVENOUS | Status: DC
  Administered 2021-01-06: 1700 [IU]/h via INTRAVENOUS
  Administered 2021-01-06: 13:00:00 1500 [IU]/h via INTRAVENOUS
  Administered 2021-01-07: 3000 [IU]/h via INTRAVENOUS
  Administered 2021-01-07: 1900 [IU]/h via INTRAVENOUS
  Administered 2021-01-08: 1300 [IU]/h via INTRAVENOUS
  Filled 2021-01-06 (×6): qty 250

## 2021-01-06 MED ORDER — OXYCODONE-ACETAMINOPHEN 5-325 MG PO TABS
1.0000 | ORAL_TABLET | ORAL | Status: DC | PRN
  Administered 2021-01-06 – 2021-01-11 (×12): 2 via ORAL
  Filled 2021-01-06 (×14): qty 2

## 2021-01-06 MED ORDER — HYDRALAZINE HCL 20 MG/ML IJ SOLN: 5.0000 mg | INTRAMUSCULAR | Status: DC | PRN

## 2021-01-06 MED ORDER — ATORVASTATIN CALCIUM 80 MG PO TABS
80.0000 mg | ORAL_TABLET | Freq: Every day | ORAL | Status: DC
  Administered 2021-01-06 – 2021-01-11 (×6): 80 mg via ORAL
  Filled 2021-01-06 (×6): qty 1

## 2021-01-06 MED ORDER — POTASSIUM CHLORIDE CRYS ER 20 MEQ PO TBCR
20.0000 meq | EXTENDED_RELEASE_TABLET | Freq: Once | ORAL | Status: AC
  Administered 2021-01-06: 20 meq via ORAL
  Filled 2021-01-06: qty 2

## 2021-01-06 MED ORDER — FUROSEMIDE 40 MG PO TABS
40.0000 mg | ORAL_TABLET | Freq: Every day | ORAL | Status: DC
  Administered 2021-01-08 – 2021-01-12 (×5): 40 mg via ORAL
  Filled 2021-01-06 (×5): qty 1

## 2021-01-06 MED ORDER — HYDROMORPHONE HCL 1 MG/ML IJ SOLN
0.5000 mg | INTRAMUSCULAR | Status: DC | PRN
Start: 2021-01-06 — End: 2021-01-12
  Administered 2021-01-07 – 2021-01-11 (×9): 1 mg via INTRAVENOUS
  Filled 2021-01-06 (×9): qty 1

## 2021-01-06 MED ORDER — ACETAMINOPHEN 650 MG RE SUPP
325.0000 mg | RECTAL | Status: DC | PRN
  Filled 2021-01-06: qty 2

## 2021-01-06 MED ORDER — GUAIFENESIN-DM 100-10 MG/5ML PO SYRP: 15.0000 mL | ORAL_SOLUTION | ORAL | Status: DC | PRN

## 2021-01-06 MED ORDER — PANTOPRAZOLE SODIUM 40 MG PO TBEC
40.0000 mg | DELAYED_RELEASE_TABLET | Freq: Every day | ORAL | Status: DC
  Administered 2021-01-06 – 2021-01-12 (×6): 40 mg via ORAL
  Filled 2021-01-06 (×6): qty 1

## 2021-01-06 MED ORDER — HEPARIN BOLUS VIA INFUSION
2500.0000 [IU] | Freq: Once | INTRAVENOUS | Status: AC
  Administered 2021-01-06: 2500 [IU] via INTRAVENOUS
  Filled 2021-01-06: qty 2500

## 2021-01-06 MED ORDER — CARVEDILOL 6.25 MG PO TABS
6.2500 mg | ORAL_TABLET | Freq: Two times a day (BID) | ORAL | Status: DC
  Administered 2021-01-06 – 2021-01-12 (×12): 6.25 mg via ORAL
  Filled 2021-01-06 (×12): qty 1

## 2021-01-06 MED ORDER — ALUM & MAG HYDROXIDE-SIMETH 200-200-20 MG/5ML PO SUSP: 15.0000 mL | ORAL | Status: DC | PRN

## 2021-01-06 MED ORDER — PHENOL 1.4 % MT LIQD: 1.0000 | OROMUCOSAL | Status: DC | PRN

## 2021-01-06 MED ORDER — METOPROLOL TARTRATE 5 MG/5ML IV SOLN: 2.0000 mg | INTRAVENOUS | Status: DC | PRN

## 2021-01-06 MED ORDER — ACETAMINOPHEN 325 MG PO TABS: 325.0000 mg | ORAL_TABLET | ORAL | Status: DC | PRN

## 2021-01-06 MED ORDER — DOCUSATE SODIUM 100 MG PO CAPS
100.0000 mg | ORAL_CAPSULE | Freq: Two times a day (BID) | ORAL | Status: DC
  Administered 2021-01-06 – 2021-01-11 (×9): 100 mg via ORAL
  Filled 2021-01-06 (×12): qty 1

## 2021-01-06 MED ORDER — SODIUM CHLORIDE 0.9% FLUSH: 3.0000 mL | INTRAVENOUS | Status: DC | PRN

## 2021-01-06 MED ORDER — METFORMIN HCL 500 MG PO TABS
1000.0000 mg | ORAL_TABLET | Freq: Two times a day (BID) | ORAL | Status: DC
  Administered 2021-01-06: 1000 mg via ORAL
  Filled 2021-01-06: qty 2

## 2021-01-06 NOTE — Progress Notes (Signed)
ANTICOAGULATION CONSULT NOTE - Initial Consult  Pharmacy Consult for heparin Indication: Occluded left lower extremity bypass graft  Allergies  Allergen Reactions  . Lisinopril Other (See Comments)    Syncope  . Codeine Rash    Patient Measurements:   Heparin Dosing Weight: 91kg  Vital Signs: Temp: 97.6 F (36.4 C) (03/08 1945) Temp Source: Oral (03/08 1945) BP: 126/72 (03/08 1945) Pulse Rate: 80 (03/08 1945)  Labs: Recent Labs    01/06/21 0920 01/06/21 1209 01/06/21 1907  HGB 12.5* 13.0  --   HCT 40.9 41.8  --   PLT 384 351  --   LABPROT  --  13.2  --   INR  --  1.0  --   HEPARINUNFRC  --   --  <0.10*  CREATININE 1.04  --   --     CrCl cannot be calculated (Unknown ideal weight.).   Medical History: Past Medical History:  Diagnosis Date  . Acute pulmonary edema (HCC) 06/05/2018  . Arthritis   . COPD (chronic obstructive pulmonary disease) (HCC)    " beginning stages " per pt  . Coronary artery disease   . Diabetes mellitus without complication (HCC)    type 2  . Diabetic neuropathy (HCC)   . Diabetic neuropathy (HCC)   . Emphysema/COPD (HCC)    " beginning stages"  . GERD (gastroesophageal reflux disease)   . HLD (hyperlipidemia)   . HOH (hard of hearing)    no hearing aids  . Myocardial infarction (HCC)   . Peripheral vascular disease (HCC)    Assessment: 27 YOM presenting with left leg pain, hx of PAD s/p bypass + iliac stent on DAPT PTA.  CT showing bypass graft occlusion.  Chronic anemia stable, plts wnl.  Heparin level came back undetectable without any issue with the drip per RN. We will give small bolus and increase the rate again.   Goal of Therapy:  Heparin level 0.3-0.7 units/ml Monitor platelets by anticoagulation protocol: Yes   Plan:  Heparin bolus 2500 units x1 Increase heparin to 1700 units/hr Check HL in AM VVS plans and long term [facility] recs  Ulyses Southward, PharmD, BCIDP, AAHIVP, CPP Infectious Disease Pharmacist 01/06/2021 8:30  PM

## 2021-01-06 NOTE — H&P (Addendum)
Hospital Consult  VASCULAR SURGERY ASSESSMENT & PLAN:   ISCHEMIC LEFT LOWER EXTREMITY: This patient has had multiple revascularization attempts in the left lower extremity. It sounds like he occluded his graft 4 to 5 days ago. He understands that he is running out of options. He is on the schedule tomorrow for placement of a lysis catheter with a follow-up study on Thursday. He understands that he is at high risk for limb loss.   His previous vascular surgery has included: 08/21/18: Left CFA to posterior tibial artery bypass with vein 01/17/20: Thrombolysis/ mechanical thrombectomy/ PTA of bypass and PTA 07/10/20: Left external iliac artery and common femoral artery thrombectomy, left posterior tibial artery thrombectomy, and redo left common femoral artery to posterior tibial artery bypass with composite graft (6 mm PTFE) 08/04/2020: Thrombectomy of the left common femoral to posterior tibial artery bypass graft, angioplasty and stenting of the left external iliac artery, left below-knee popliteal artery and tibial peroneal trunk patch angioplasty and left common femoral artery to below-knee popliteal artery bypass with PTFE onto the patch.  Waverly Ferrari, MD Office: [phone]   Reason for Consult:  Critical limb ischemia  Requesting Physician: Renne Crigler MRN #:  [id number]  History of Present Illness: This is a 65 y.o. male who is very well known to [facility]. He has a  history of diabetes, coronary artery disease, and peripheral vascular disease who presents for evaluation of 4-5 days of episodic but creasing left foot pain. He said initially it felt like a severe charlie horse in his left calf but that subsided. His calf is still very sore to touch. The pain has increased over the past 24 hours to where he has been unable to bear weight on the left foot due to pain. He has been having shooting pains into the left 1st and 5th toes. His left foot has also felt cold. From having previous  interventions he suspected that his bypass was again occluded so he presented to ED.   He has a somewhat complex history in the left leg. He initially underwent left common femoral to PT bypass with reversed saphenous vein in 2019 by Dr. Chestine Spore for critical limb ischemia with rest pain.  He again presented in March 2021 with an occluded bypass and underwent thrombolysis and subsequent percutaneous mechanical thrombectomy with angioplasty of the bypass and PT on 01/16/20- 01/17/2020 by Dr. Chestine Spore. Ultimately his bypass was salvaged and he was discharged on Eliquis. During this event he did develop necrotic left great toe which was being monitored as outpatient. He subsequently presented again in September 2021 with an occluded bypass graft and underwent second attempt at thrombolysis that was unsuccessful.  Ultimately during the attempted thrombolysis he developed thrombus in the common femoral and iliac and requiring thrombectomy and ultimately he had to have a left common femoral to PT composite bypass using the distal vein patch on the PT. Arterial duplex today shows that this bypass graft is now thrombosed. He reports he has been compliant with his Aspirin, statin and Plavix.   Past Medical History:  Diagnosis Date  . Acute pulmonary edema (HCC) 06/05/2018  . Arthritis   . COPD (chronic obstructive pulmonary disease) (HCC)    " beginning stages " per pt  . Coronary artery disease   . Diabetes mellitus without complication (HCC)    type 2  . Diabetic neuropathy (HCC)   . Diabetic neuropathy (HCC)   . Emphysema/COPD (HCC)    " beginning stages"  . GERD (gastroesophageal  reflux disease)   . HLD (hyperlipidemia)   . HOH (hard of hearing)    no hearing aids  . Myocardial infarction (HCC)   . Peripheral vascular disease [facility])     Past Surgical History:  Procedure Laterality Date  . ABDOMINAL AORTOGRAM W/LOWER EXTREMITY N/A 06/21/2018   Procedure: ABDOMINAL AORTOGRAM W/LOWER EXTREMITY;   Surgeon: Cephus Shelling, MD;  Location: [facility];  Service: Cardiovascular;  Laterality: N/A;  . ABDOMINAL AORTOGRAM W/LOWER EXTREMITY Left 01/16/2020   Procedure: ABDOMINAL AORTOGRAM W/LOWER EXTREMITY;  Surgeon: Cephus Shelling, MD;  Location: [facility];  Service: Cardiovascular;  Laterality: Left;  . ABDOMINAL AORTOGRAM W/LOWER EXTREMITY N/A 07/09/2020   Procedure: ABDOMINAL AORTOGRAM W/LOWER EXTREMITY;  Surgeon: Cephus Shelling, MD;  Location: [facility];  Service: Cardiovascular;  Laterality: N/A;  TPA infusion  . AMPUTATION Left 08/04/2020   Procedure: LEFT PARTIAL GREAT TOE AMPUTATION;  Surgeon: Cephus Shelling, MD;  Location: [facility] OR;  Service: Vascular;  Laterality: Left;  . APPLICATION OF WOUND VAC Left 08/04/2020   Procedure: LEFT GROIN DEBRIDEMENT WITH APPLICATION OF WOUND VAC;  Surgeon: Cephus Shelling, MD;  Location: [facility] OR;  Service: Vascular;  Laterality: Left;  . CORONARY ARTERY BYPASS GRAFT N/A 06/12/2018   Procedure: CORONARY ARTERY BYPASS GRAFTING (CABG) x 3 WITH ENDOSCOPIC HARVESTING OF RIGHT GREATER SAPHENOUS VEIN. LIMA TO LAD. SVG TO PD. SVG TO DIAGONAL.;  Surgeon: Kerin Perna, MD;  Location: [facility] OR;  Service: Open Heart Surgery;  Laterality: N/A;  . FEMORAL-POPLITEAL BYPASS GRAFT Left 07/10/2020   Procedure: REDO EXPOSURE OF LEFT COMMON FEMORAL ARTERY LEFT COMMON FEMORAL TO POSTERIOR TIBIAL COMPOSITE BYPASS GRAFT;  Surgeon: Cephus Shelling, MD;  Location: [facility] OR;  Service: Vascular;  Laterality: Left;  . FEMORAL-TIBIAL BYPASS GRAFT Left 08/21/2018   Procedure: BYPASS GRAFT LEFT FEMORAL TO POSTERIOR TIBIAL ARTERY USING LEFT REVERSED GREAT SAPHENOUS VEIN;  Surgeon: Cephus Shelling, MD;  Location: [facility] OR;  Service: Vascular;  Laterality: Left;  . FEMORAL-TIBIAL BYPASS GRAFT Left 08/04/2020   Procedure: REVISION FEMORAL-DISTAL BYPASS WITH BIOLOGICAL BOVINE PATCH;  Surgeon: Cephus Shelling, MD;  Location: [facility] OR;  Service:  Vascular;  Laterality: Left;  . KNEE ARTHROSCOPY    . LOWER EXTREMITY ANGIOGRAM Left 08/04/2020   Procedure: LEFT ILIAC ANGIOGRAM, ANGIOPLASTY OF LEFT ILIAC STENT WITH DRUG COATED BALLOON;  Surgeon: Cephus Shelling, MD;  Location: [facility] OR;  Service: Vascular;  Laterality: Left;  . LOWER EXTREMITY ANGIOGRAPHY Left 01/17/2020   Procedure: Lower Extremity Angiography;  Surgeon: Cephus Shelling, MD;  Location: [facility] INVASIVE CV LAB;  Service: Cardiovascular;  Laterality: Left;  . PERIPHERAL VASCULAR BALLOON ANGIOPLASTY Left 01/17/2020   Procedure: PERIPHERAL VASCULAR BALLOON ANGIOPLASTY;  Surgeon: Cephus Shelling, MD;  Location: [facility];  Service: Cardiovascular;  Laterality: Left;  pt  . PERIPHERAL VASCULAR INTERVENTION Left 06/21/2018   Procedure: PERIPHERAL VASCULAR INTERVENTION;  Surgeon: Cephus Shelling, MD;  Location: [facility] INVASIVE CV LAB;  Service: Cardiovascular;  Laterality: Left;  . PERIPHERAL VASCULAR THROMBECTOMY Left 01/16/2020   Procedure: PERIPHERAL VASCULAR THROMBECTOMY;  Surgeon: Cephus Shelling, MD;  [facility]: [facility];  Service: Cardiovascular;  Laterality: Left;  Lytic Catheter Placement left fem-pop bypass  . PERIPHERAL VASCULAR THROMBECTOMY Left 01/17/2020   Procedure: PERIPHERAL VASCULAR THROMBECTOMY;  Surgeon: Cephus Shelling, MD;  Location: [facility];  Service: Cardiovascular;  Laterality: Left;  fem-pt bypass  . PERIPHERAL VASCULAR THROMBECTOMY Left 07/10/2020  Procedure: lysis recheck;  Surgeon: Cephus Shelling, MD;  Location: [facility] INVASIVE CV LAB;  Service: Cardiovascular;  Laterality: Left;  . RIGHT/LEFT HEART CATH AND CORONARY ANGIOGRAPHY N/A 06/05/2018   Procedure: RIGHT/LEFT HEART CATH AND CORONARY ANGIOGRAPHY;  Surgeon: Orpah Cobb, MD;  Location: [facility];  Service: Cardiovascular;  Laterality: N/A;  . SPINE SURGERY    . TEE WITHOUT CARDIOVERSION N/A 06/12/2018   Procedure: TRANSESOPHAGEAL ECHOCARDIOGRAM  (TEE);  Surgeon: Donata Clay, Theron Arista, MD;  Location: [facility] OR;  Service: Open Heart Surgery;  Laterality: N/A;  . teeth extractions    . THROMBECTOMY FEMORAL ARTERY Left 08/04/2020   Procedure: THROMBECTOMY OF LEFT FEMORAL TP COMPOSTITE BYPASS;  Surgeon: Cephus Shelling, MD;  Location: [facility] OR;  Service: Vascular;  Laterality: Left;  . THROMBECTOMY ILIAC ARTERY Left 07/10/2020   Procedure: THROMBECTOMY OF LEFT EXTERNAL ILIAC AND LEFT COMMON FEMORAL AND LEFT POSTERIOR TIBIAL ARTERIES;  Surgeon: Cephus Shelling, MD;  Location: [facility] OR;  Service: Vascular;  Laterality: Left;  Marland Kitchen VEIN HARVEST Left 08/21/2018   Procedure: VEIN HARVEST LEFT GREAT SAPHENOUS VEIN;  Surgeon: Cephus Shelling, MD;  Location: [facility] OR;  Service: Vascular;  Laterality: Left;    Allergies  Allergen Reactions  . Lisinopril Other (See Comments)    Syncope  . Codeine Rash    Prior to Admission medications   Medication Sig Start Date End Date Taking? Authorizing Provider  aspirin EC 81 MG EC tablet Take 1 tablet (81 mg total) by mouth daily at 6 (six) AM. Swallow whole. 07/12/20   Lars Mage, PA-C  atorvastatin (LIPITOR) 80 MG tablet Take 1 tablet (80 mg total) by mouth at bedtime. 08/07/20   Baglia, Corrina, PA-C  carvedilol (COREG) 6.25 MG tablet Take 1 tablet (6.25 mg total) by mouth 2 (two) times daily with a meal. 06/27/20   Janeece Agee, NP  clopidogrel (PLAVIX) 75 MG tablet Take 1 tablet (75 mg total) by mouth daily. 08/07/20   Baglia, Corrina, PA-C  furosemide (LASIX) 40 MG tablet Take 1 tablet (40 mg total) by mouth daily. 06/27/20   Janeece Agee, NP  glipiZIDE (GLUCOTROL) 10 MG tablet Take 1 tablet (10 mg total) by mouth 2 (two) times daily before a meal. 06/27/20   Janeece Agee, NP  metFORMIN (GLUCOPHAGE) 1000 MG tablet Take 1 tablet (1,000 mg total) by mouth 2 (two) times daily with a meal. 06/27/20   Janeece Agee, NP  oxyCODONE-acetaminophen (PERCOCET/ROXICET) 5-325 MG tablet Take 1 tablet by mouth every  6 (six) hours as needed for moderate pain or severe pain. Patient not taking: Reported on 08/26/2020 08/07/20   Baglia, Corrina, PA-C  potassium chloride SA (KLOR-CON) 20 MEQ tablet Take 1 tablet (20 mEq total) by mouth daily. 06/27/20   Janeece Agee, NP    Social History   Socioeconomic History  . Marital status: Single    Spouse name: Not on file  . Number of children: Not on file  . Years of education: Not on file  . Highest education level: Not on file  Occupational History  . Not on file  Tobacco Use  . Smoking status: Former Smoker    Packs/day: 1.00    Years: 40.00    Pack years: 40.00    Types: Cigarettes    Quit date: 06/02/2018    Years since quitting: 2.6  . Smokeless tobacco: Never Used  Vaping Use  . Vaping Use: Never used  Substance and Sexual Activity  . Alcohol use: Yes  Comment: occasional  . Drug use: Not Currently    Types: Marijuana    Comment: quit yrs ago  . Sexual activity: Not on file  Other Topics Concern  . Not on file  Social History Narrative  . Not on file   Social Determinants of Health   Financial Resource Strain: Not on file  Food Insecurity: Not on file  Transportation Needs: Not on file  Physical Activity: Not on file  Stress: Not on file  Social Connections: Not on file  Intimate Partner Violence: Not on file    Family History  Problem Relation Age of Onset  . Diabetes Mother   . Lung disease Mother   . Cancer Father   . Heart disease Father     ROS: Otherwise negative unless mentioned in HPI  Physical Examination  Vitals:   01/06/21 0912 01/06/21 1122  BP: (!) 182/112 (!) 151/88  Pulse: 95 80  Resp: 18 16  Temp: 98 F (36.7 C) 97.6 F (36.4 C)  SpO2: 100% 95%   There is no height or weight on file to calculate BMI.  General:  WDWN in NAD Gait: Not observed HENT: WNL, normocephalic Pulmonary: normal non-labored breathing, without wheezing Cardiac: regular rate and rhythm Abdomen:  soft, NT/ND, no  masses Vascular Exam/Pulses: 2+ femoral pulses  Bilaterally, bilateral lower extremities warm, some coolness of the left toes. Left foot with some diminished motor but normal sensation Extremities: without ischemic changes, without Gangrene , without cellulitis; without open wounds; Left 1st toe partial amputation site well healed, there is some duskiness on the posterior plantar aspect of the toe. This area is very tender to palpation. Brisk right doppler PT and DP. Monophasic doppler PT/ DP signals in left foot. No palpable pulses Musculoskeletal: no muscle wasting or atrophy  Neurologic: A&O X 3;  No focal weakness or paresthesias are detected; speech is fluent/normal Psychiatric:  The pt has Normal affect.   CBC    Component Value Date/Time   WBC 12.2 (H) 01/06/2021 0920   RBC 4.59 01/06/2021 0920   HGB 12.5 (L) 01/06/2021 0920   HCT 40.9 01/06/2021 0920   PLT 384 01/06/2021 0920   MCV 89.1 01/06/2021 0920   MCH 27.2 01/06/2021 0920   MCHC 30.6 01/06/2021 0920   RDW 15.9 (H) 01/06/2021 0920   LYMPHSABS 1.5 01/15/2020 0750   MONOABS 1.2 (H) 01/15/2020 0750   EOSABS 0.2 01/15/2020 0750   BASOSABS 0.1 01/15/2020 0750    BMET    Component Value Date/Time   NA 130 (L) 01/06/2021 0920   NA 137 06/27/2020 1216   K 4.9 01/06/2021 0920   CL 99 01/06/2021 0920   CO2 21 (L) 01/06/2021 0920   GLUCOSE 382 (H) 01/06/2021 0920   BUN 14 01/06/2021 0920   BUN 17 06/27/2020 1216   CREATININE 1.04 01/06/2021 0920   CREATININE 0.81 08/14/2014 1126   CALCIUM 9.5 01/06/2021 0920   GFRNONAA >60 01/06/2021 0920   GFRNONAA >89 08/14/2014 1126   GFRAA 58 (L) 08/05/2020 0524   GFRAA >89 08/14/2014 1126    COAGS: Lab Results  Component Value Date   INR 1.1 08/07/2020   INR 1.2 08/04/2020   INR 2.3 (H) 07/31/2020     Non-Invasive Vascular Imaging:    Left Graft #1:  +--------------------+--------+--------+----------+--------+            PSV cm/sStenosisWaveform  Comments  +--------------------+--------+--------+----------+--------+  Inflow       110       monophasic      +--------------------+--------+--------+----------+--------+  Proximal Anastomosis    occluded           +--------------------+--------+--------+----------+--------+  Proximal Graft       occluded           +--------------------+--------+--------+----------+--------+  Mid Graft          occluded           +--------------------+--------+--------+----------+--------+  Distal Graft        occluded           +--------------------+--------+--------+----------+--------+  Distal Anastomosis     occluded           +--------------------+--------+--------+----------+--------+  Outflow       18       monophasic      +--------------------+--------+--------+----------+--------+   Summary:  Left: Left Femoral artery to posterior tibial bypass graft appears occluded.   ABI Findings:  +--------+------------------+-----+----------+--------+  Right  Rt Pressure (mmHg)IndexWaveform Comment   +--------+------------------+-----+----------+--------+  [id number]           triphasic       +--------+------------------+-----+----------+--------+  PTA   100        0.67 monophasic      +--------+------------------+-----+----------+--------+  DP   98        0.66 monophasic      +--------+------------------+-----+----------+--------+   +--------+------------------+-----+-------------------+-------+  Left  Lt Pressure (mmHg)IndexWaveform      Comment  +--------+------------------+-----+-------------------+-------+  [id number]           triphasic           +--------+------------------+-----+-------------------+-------+  PTA   70         0.47 dampened monophasic      +--------+------------------+-----+-------------------+-------+  DP   83        0.56 dampened monophasic      +--------+------------------+-----+-------------------+-------+   Statin:  Yes.   Beta Blocker:  No. Aspirin:  Yes.   ACEI:  No. ARB:  No. CCB use:  Yes Other antiplatelets/anticoagulants:  Yes.   Plavix   ASSESSMENT/PLAN: This is a 65 y.o. male with history of multiple revascularization procedures of the left lower extremity with most recently undergoing a left femoral to posterior tibial composite PTFE bypass graft with bovine patch angioplasty of PT  In October of 2021, who now presents with occlusion of his bypass graft and critical limb ischemia with rest pain. Patient has monophasic signals in foot. Motor is diminished but has full sensation intact. He has no tissue loss. I do not feel that he needs to be immediately taken to the operating room today.  - Will plan to admit patient for pain control and start him on IV heparin. He realizes that he is at high risk for limb loss.  -Will plan for angiography over next day or two with possible attempt at thrombolysis to salvage bypass graft. - Alternatively he may need surgical exploration or redo bypass, however since he has had multiple interventions in the past this is less likely feasible option - The on call surgeon Dr. Edilia Boickson will see and evaluate patient later this afternoon and provide further management plans   Graceann CongressCorrina Baglia PA-C [facility] [phone] 01/06/2021  12:07 PM

## 2021-01-06 NOTE — Progress Notes (Signed)
Pt arrived to 4e from ED. Pt oriented to room and staff. Vitals obtained. Telemetry placed and CCMD notified. CHG bath done. Heparin drip infusing. Pain medication given, per pt request. Will continue current plan of care.

## 2021-01-06 NOTE — ED Triage Notes (Signed)
Pt reports he is here today due to left leg pain. Pt reports he thinks he might have an infection/ clot. Pt reports that he has increased swelling to his foot. Pt reports x5 surgeries on his leg due to PAD. Unable to feel pedal pulse in triage due to swelling in foot.

## 2021-01-06 NOTE — Progress Notes (Signed)
ABI and arterial duplex has been completed has been completed.   Preliminary results in CV Proc.   Blanch Media 01/06/2021 11:05 AM

## 2021-01-06 NOTE — Progress Notes (Signed)
ANTICOAGULATION CONSULT NOTE - Initial Consult  Pharmacy Consult for heparin Indication: Occluded left lower extremity bypass graft  Allergies  Allergen Reactions  . Lisinopril Other (See Comments)    Syncope  . Codeine Rash    Patient Measurements:   Heparin Dosing Weight: 91kg  Vital Signs: Temp: 97.6 F (36.4 C) (03/08 1122) Temp Source: Oral (03/08 1122) BP: 151/88 (03/08 1122) Pulse Rate: 80 (03/08 1122)  Labs: Recent Labs    01/06/21 0920  HGB 12.5*  HCT 40.9  PLT 384  CREATININE 1.04    CrCl cannot be calculated (Unknown ideal weight.).   Medical History: Past Medical History:  Diagnosis Date  . Acute pulmonary edema (HCC) 06/05/2018  . Arthritis   . COPD (chronic obstructive pulmonary disease) (HCC)    " beginning stages " per pt  . Coronary artery disease   . Diabetes mellitus without complication (HCC)    type 2  . Diabetic neuropathy (HCC)   . Diabetic neuropathy (HCC)   . Emphysema/COPD (HCC)    " beginning stages"  . GERD (gastroesophageal reflux disease)   . HLD (hyperlipidemia)   . HOH (hard of hearing)    no hearing aids  . Myocardial infarction (HCC)   . Peripheral vascular disease (HCC)    Assessment: 82 YOM presenting with left leg pain, hx of PAD s/p bypass + iliac stent on DAPT PTA.  CT showing bypass graft occlusion.  Chronic anemia stable, plts wnl.    Goal of Therapy:  Heparin level 0.3-0.7 units/ml Monitor platelets by anticoagulation protocol: Yes   Plan:  Heparin 5500 units IV x 1, and gtt at 1500 units/hr F/u 6 hour heparin level [facility] plans and long term [facility] recs  Daylene Posey, PharmD Clinical Pharmacist ED Pharmacist Phone # [phone] 01/06/2021 12:27 PM

## 2021-01-06 NOTE — ED Notes (Signed)
Pt taken to vascular lab at this time.

## 2021-01-06 NOTE — ED Provider Notes (Signed)
[facility] EMERGENCY DEPARTMENT Provider Note   CSN: [id number] Arrival date & time: 01/06/21  7867     History Chief Complaint  Patient presents with  . Leg Pain    Robert Sanchez is a 65 y.o. male.  Patient with history of COPD, diabetes with neuropathy, peripheral arterial disease status post bypass grafting in the left leg and an iliac stent currently on Plavix and aspirin --presents today for evaluation of worsening pain in the left foot and lower leg.  Patient states that he has increased pain with movement and with bearing weight.  He states that his symptoms remind him of when he has had worsening clot in the leg in the past.  He notes slightly worsened swelling.  He states that over the weekend, his foot was very cold.  No fevers, chest pain or shortness of breath.  He states compliance with antiplatelet drugs.  He has not spoken with his vascular surgeon regarding his worsening symptoms at the current time.  States that he usually just comes to the hospital.  The onset of this condition was gradual. The course is worsening. Alleviating factors: none.          Past Medical History:  Diagnosis Date  . Acute pulmonary edema (HCC) 06/05/2018  . Arthritis   . COPD (chronic obstructive pulmonary disease) (HCC)    " beginning stages " per pt  . Coronary artery disease   . Diabetes mellitus without complication (HCC)    type 2  . Diabetic neuropathy (HCC)   . Diabetic neuropathy (HCC)   . Emphysema/COPD (HCC)    " beginning stages"  . GERD (gastroesophageal reflux disease)   . HLD (hyperlipidemia)   . HOH (hard of hearing)    no hearing aids  . Myocardial infarction (HCC)   . Peripheral vascular disease [facility])     Patient Active Problem List   Diagnosis Date Noted  . Acute venous embolism and thrombosis of deep vessels of proximal lower extremity (HCC) 07/16/2020  . Cellulitis of foot 01/15/2020  . Critical ischemia of extremity with history of  revascularization of same extremity (HCC) 01/15/2020  . Leukocytosis 01/15/2020  . CKD (chronic kidney disease), stage III (HCC) 01/15/2020  . Acute on chronic systolic CHF (congestive heart failure) (HCC) 05/04/2019  . Mild renal insufficiency 05/04/2019  . LFT elevation 05/04/2019  . PAD (peripheral artery disease) (HCC) 08/21/2018  . PVD (peripheral vascular disease) (HCC) 07/18/2018  . 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 CAD (coronary artery disease) 06/05/2018  . Pulmonary edema 06/03/2018  . DKA, type 2 (HCC) 06/03/2018  . Diabetic acidosis without coma (HCC)   . Sebaceous cyst 08/23/2016  . Acute pain of right knee 08/23/2016  . Hidradenitis 08/23/2016  . Right hip pain 08/14/2014  . Diabetes mellitus type II, non insulin dependent (HCC) 11/27/2013  . Arthritis 11/27/2013  . Neuropathy in diabetes (HCC) 11/27/2013    Past Surgical History:  Procedure Laterality Date  . ABDOMINAL AORTOGRAM W/LOWER EXTREMITY N/A 06/21/2018   Procedure: ABDOMINAL AORTOGRAM W/LOWER EXTREMITY;  Surgeon: Cephus Shelling, MD;  Location: [facility] INVASIVE CV LAB;  Service: Cardiovascular;  Laterality: N/A;  . ABDOMINAL AORTOGRAM W/LOWER EXTREMITY Left 01/16/2020   Procedure: ABDOMINAL AORTOGRAM W/LOWER EXTREMITY;  Surgeon: Cephus Shelling, MD;  Location: [facility] INVASIVE CV LAB;  Service: Cardiovascular;  Laterality: Left;  . ABDOMINAL AORTOGRAM W/LOWER EXTREMITY N/A 07/09/2020   Procedure: ABDOMINAL AORTOGRAM W/LOWER EXTREMITY;  Surgeon: Cephus Shelling, MD;  Location:  [facility] INVASIVE CV LAB;  Service: Cardiovascular;  Laterality: N/A;  TPA infusion  . AMPUTATION Left 08/04/2020   Procedure: LEFT PARTIAL GREAT TOE AMPUTATION;  Surgeon: Cephus Shelling, MD;  Location: [facility] OR;  Service: Vascular;  Laterality: Left;  . APPLICATION OF WOUND VAC Left 08/04/2020   Procedure: LEFT GROIN DEBRIDEMENT WITH APPLICATION OF WOUND VAC;  Surgeon: Cephus Shelling, MD;  Location: [facility] OR;  Service: Vascular;  Laterality:  Left;  . CORONARY ARTERY BYPASS GRAFT N/A 06/12/2018   Procedure: CORONARY ARTERY BYPASS GRAFTING (CABG) x 3 WITH ENDOSCOPIC HARVESTING OF RIGHT GREATER SAPHENOUS VEIN. LIMA TO LAD. SVG TO PD. SVG TO DIAGONAL.;  Surgeon: Kerin Perna, MD;  Location: [facility] OR;  Service: Open Heart Surgery;  Laterality: N/A;  . FEMORAL-POPLITEAL BYPASS GRAFT Left 07/10/2020   Procedure: REDO EXPOSURE OF LEFT COMMON FEMORAL ARTERY LEFT COMMON FEMORAL TO POSTERIOR TIBIAL COMPOSITE BYPASS GRAFT;  Surgeon: Cephus Shelling, MD;  Location: [facility] OR;  Service: Vascular;  Laterality: Left;  . FEMORAL-TIBIAL BYPASS GRAFT Left 08/21/2018   Procedure: BYPASS GRAFT LEFT FEMORAL TO POSTERIOR TIBIAL ARTERY USING LEFT REVERSED GREAT SAPHENOUS VEIN;  Surgeon: Cephus Shelling, MD;  Location: [facility] OR;  Service: Vascular;  Laterality: Left;  . FEMORAL-TIBIAL BYPASS GRAFT Left 08/04/2020   Procedure: REVISION FEMORAL-DISTAL BYPASS WITH BIOLOGICAL BOVINE PATCH;  Surgeon: Cephus Shelling, MD;  Location: [facility] OR;  Service: Vascular;  Laterality: Left;  . KNEE ARTHROSCOPY    . LOWER EXTREMITY ANGIOGRAM Left 08/04/2020   Procedure: LEFT ILIAC ANGIOGRAM, ANGIOPLASTY OF LEFT ILIAC STENT WITH DRUG COATED BALLOON;  Surgeon: Cephus Shelling, MD;  Location: [facility] OR;  Service: Vascular;  Laterality: Left;  . LOWER EXTREMITY ANGIOGRAPHY Left 01/17/2020   Procedure: Lower Extremity Angiography;  Surgeon: Cephus Shelling, MD;  Location: [facility] INVASIVE CV LAB;  Service: Cardiovascular;  Laterality: Left;  . PERIPHERAL VASCULAR BALLOON ANGIOPLASTY Left 01/17/2020   Procedure: PERIPHERAL VASCULAR BALLOON ANGIOPLASTY;  Surgeon: Cephus Shelling, MD;  Location: [facility] LAB;  Service: Cardiovascular;  Laterality: Left;  pt  . PERIPHERAL VASCULAR INTERVENTION Left 06/21/2018   Procedure: PERIPHERAL VASCULAR INTERVENTION;  Surgeon: Cephus Shelling, MD;  Location: [facility] INVASIVE CV LAB;  Service: Cardiovascular;  Laterality: Left;  .  PERIPHERAL VASCULAR THROMBECTOMY Left 01/16/2020   Procedure: PERIPHERAL VASCULAR THROMBECTOMY;  Surgeon: Cephus Shelling, MD;  [facility]: [facility] LAB;  Service: Cardiovascular;  Laterality: Left;  Lytic Catheter Placement left fem-pop bypass  . PERIPHERAL VASCULAR THROMBECTOMY Left 01/17/2020   Procedure: PERIPHERAL VASCULAR THROMBECTOMY;  Surgeon: Cephus Shelling, MD;  [facility]: [facility];  Service: Cardiovascular;  Laterality: Left;  fem-pt bypass  . PERIPHERAL VASCULAR THROMBECTOMY Left 07/10/2020   Procedure: lysis recheck;  Surgeon: Cephus Shelling, MD;  Location: [facility] INVASIVE CV LAB;  Service: Cardiovascular;  Laterality: Left;  . RIGHT/LEFT HEART CATH AND CORONARY ANGIOGRAPHY N/A 06/05/2018   Procedure: RIGHT/LEFT HEART CATH AND CORONARY ANGIOGRAPHY;  Surgeon: Orpah Cobb, MD;  Location: [facility] INVAS[facility] CV LAB;  Service: Cardiovascular;  Laterality: N/A;  . SPINE SURGERY    . TEE WITHOUT CARDIOVERSION N/A 06/12/2018   Procedure: TRANSESOPHAGEAL ECHOCARDIOGRAM (TEE);  Surgeon: Donata Clay, Theron Arista, MD;  Location: [facility] OR;  Service: Open Heart Surgery;  Laterality: N/A;  . teeth extractions    . THROMBECTOMY FEMORAL ARTERY Left 08/04/2020   Procedure: THROMBECTOMY OF LEFT FEMORAL TP COMPOSTITE BYPASS;  Surgeon: Cephus Shelling, MD;  Location: [facility] OR;  Service: Vascular;  Laterality: Left;  . THROMBECTOMY ILIAC ARTERY Left 07/10/2020   Procedure: THROMBECTOMY OF LEFT EXTERNAL ILIAC AND LEFT COMMON FEMORAL AND LEFT POSTERIOR TIBIAL ARTERIES;  Surgeon: Cephus Shelling, MD;  Location: [facility] OR;  Service: Vascular;  Laterality: Left;  Marland Kitchen VEIN HARVEST Left 08/21/2018   Procedure: VEIN HARVEST LEFT GREAT SAPHENOUS VEIN;  Surgeon: Cephus Shelling, MD;  Location: [facility] OR;  Service: Vascular;  Laterality: Left;       Family History  Problem Relation Age of Onset  . Diabetes Mother   . Lung disease Mother   . Cancer Father   . Heart disease Father     Social History    Tobacco Use  . Smoking status: Former Smoker    Packs/day: 1.00    Years: 40.00    Pack years: 40.00    Types: Cigarettes    Quit date: 06/02/2018    Years since quitting: 2.6  . Smokeless tobacco: Never Used  Vaping Use  . Vaping Use: Never used  Substance Use Topics  . Alcohol use: Yes    Comment: occasional  . Drug use: Not Currently    Types: Marijuana    Comment: quit yrs ago    Home Medications Prior to Admission medications   Medication Sig Start Date End Date Taking? Authorizing Provider  aspirin EC 81 MG EC tablet Take 1 tablet (81 mg total) by mouth daily at 6 (six) AM. Swallow whole. 07/12/20   Lars Mage, PA-C  atorvastatin (LIPITOR) 80 MG tablet Take 1 tablet (80 mg total) by mouth at bedtime. 08/07/20   Baglia, Corrina, PA-C  carvedilol (COREG) 6.25 MG tablet Take 1 tablet (6.25 mg total) by mouth 2 (two) times daily with a meal. 06/27/20   Janeece Agee, NP  clopidogrel (PLAVIX) 75 MG tablet Take 1 tablet (75 mg total) by mouth daily. 08/07/20   Baglia, Corrina, PA-C  furosemide (LASIX) 40 MG tablet Take 1 tablet (40 mg total) by mouth daily. 06/27/20   Janeece Agee, NP  glipiZIDE (GLUCOTROL) 10 MG tablet Take 1 tablet (10 mg total) by mouth 2 (two) times daily before a meal. 06/27/20   Janeece Agee, NP  metFORMIN (GLUCOPHAGE) 1000 MG tablet Take 1 tablet (1,000 mg total) by mouth 2 (two) times daily with a meal. 06/27/20   Janeece Agee, NP  oxyCODONE-acetaminophen (PERCOCET/ROXICET) 5-325 MG tablet Take 1 tablet by mouth every 6 (six) hours as needed for moderate pain or severe pain. Patient not taking: Reported on 08/26/2020 08/07/20   Baglia, Corrina, PA-C  potassium chloride SA (KLOR-CON) 20 MEQ tablet Take 1 tablet (20 mEq total) by mouth daily. 06/27/20   Janeece Agee, NP    Allergies    Lisinopril and Codeine  Review of Systems   Review of Systems  Constitutional: Negative for fever.  HENT: Negative for rhinorrhea and sore throat.   Eyes:  Negative for redness.  Respiratory: Negative for cough.   Cardiovascular: Negative for chest pain.  Gastrointestinal: Negative for abdominal pain, diarrhea, nausea and vomiting.  Genitourinary: Negative for dysuria and hematuria.  Musculoskeletal: Positive for myalgias.  Skin: Positive for color change. Negative for rash.  Neurological: Negative for headaches.    Physical Exam Updated Vital Signs BP (!) 182/112 (BP Location: Right Arm)   Pulse 95   Temp 98 F (36.7 C)   Resp 18   SpO2 100%   Physical Exam Vitals and nursing note reviewed.  Constitutional:      Appearance: He is well-developed and  well-nourished.  HENT:     Head: Normocephalic and atraumatic.  Eyes:     General:        Right eye: No discharge.        Left eye: No discharge.     Conjunctiva/sclera: Conjunctivae normal.  Cardiovascular:     Rate and Rhythm: Normal rate and regular rhythm.     Pulses:          Dorsalis pedis pulses are 1+ on the right side and detected w/ Doppler on the left side.  Pulmonary:     Effort: Pulmonary effort is normal.     Breath sounds: Normal breath sounds.  Abdominal:     Palpations: Abdomen is soft.     Tenderness: There is no abdominal tenderness.  Musculoskeletal:     Cervical back: Normal range of motion and neck supple.     Comments: Patient has some tenderness with palpation of the left foot and calf.  Patient has well-healed left great toe amputation.  There is mild erythema to the bottom of the foot.  There is a healed surgical scar on the medial left calf.  No signs of infection.  Skin:    General: Skin is warm and dry.  Neurological:     Mental Status: He is alert.  Psychiatric:        Mood and Affect: Mood and affect normal.     ED Results / Procedures / Treatments   Labs (all labs ordered are listed, but only abnormal results are displayed) Labs Reviewed  CBC - Abnormal; Notable for the following components:      Result Value   WBC 12.2 (*)     Hemoglobin 12.5 (*)    RDW 15.9 (*)    All other components within normal limits  COMPREHENSIVE METABOLIC PANEL - Abnormal; Notable for the following components:   Sodium 130 (*)    CO2 21 (*)    Glucose, Bld 382 (*)    All other components within normal limits    EKG None  Radiology VAS US ABI WITH/WO TBI  Result Date: 01/06/2021 LOWER EXTREMITY DOPPLER STUDY Indications: Rest pain, and Lt tem-pta bypass graft. High Risk Factors: Hyperlipidemia, Diabetes, coronary artery disease.  Comparison Study: 08/26/20 previous Performing Technologist: Blanch MediaMegan Riddle RVS  Examination Guidelines: A complete evaluation includes at minimum, Doppler waveform signals and systolic blood pressure reading at the level of bilateral brachial, anterior tibial, and posterior tibial arteries, when vessel segments are accessible. Bilateral testing is considered an integral part of a complete examination. Photoelectric Plethysmograph (PPG) waveforms and toe systolic pressure readings are included as required and additional duplex testing as needed. Limited examinations for reoccurring indications may be performed as noted.  ABI Findings: +--------+------------------+-----+----------+--------+ Right   Rt Pressure (mmHg)IndexWaveform  Comment  +--------+------------------+-----+----------+--------+ [id number]                    triphasic          +--------+------------------+-----+----------+--------+ PTA     100               0.67 monophasic         +--------+------------------+-----+----------+--------+ DP      98                0.66 monophasic         +--------+------------------+-----+----------+--------+ +--------+------------------+-----+-------------------+-------+ Left    Lt Pressure (mmHg)IndexWaveform           Comment +--------+------------------+-----+-------------------+-------+ [id number]  triphasic                   +--------+------------------+-----+-------------------+-------+ PTA     70                0.47 dampened monophasic        +--------+------------------+-----+-------------------+-------+ DP      83                0.56 dampened monophasic        +--------+------------------+-----+-------------------+-------+ +-------+-----------+-----------+------------+------------+ ABI/TBIToday's ABIToday's TBIPrevious ABIPrevious TBI +-------+-----------+-----------+------------+------------+ Right  0.67                                           +-------+-----------+-----------+------------+------------+ Left   0.56                                           +-------+-----------+-----------+------------+------------+  Summary: Right: Resting right ankle-brachial index indicates moderate right lower extremity arterial disease. Left: Resting left ankle-brachial index indicates moderate left lower extremity arterial disease. ABIs are unreliable.  *See table(s) above for measurements and observations.     Preliminary    VAS [place] LOWER EXTREMITY ARTERIAL DUPLEX ([facility] 7a-7p)  Result Date: 01/06/2021 LOWER EXTREMITY ARTERIAL DUPLEX STUDY Indications: Rest pain. High Risk Factors: Hyperlipidemia, Diabetes, coronary artery disease.  Vascular Interventions: 08/04/20 THROMBECTOMY OF LEFT FEMORAL TP COMPOSTITE                         BYPASS (Left Leg Upper)                         REVISION FEMORAL-DISTAL BYPASS WITH BIOLOGICAL BOVINE                         PATCH (Left )                         LEFT ILIAC ANGIOGRAM, ANGIOPLASTY OF LEFT ILIAC STENT                         WITH DRUG COATED BALLOON (Left )                         LEFT PARTIAL GREAT TOE AMPUTATION (Left Toe)                         LEFT GROIN DEBRIDEMENT WITH APPLICATION OF WOUND VAC                         (Left Groin. Current ABI:            rt 0.67 lt 0.56 Comparison Study: 08/26/20 previous Performing Technologist: Blanch Media RVS   Examination Guidelines: A complete evaluation includes B-mode imaging, spectral Doppler, color Doppler, and power Doppler as needed of all accessible portions of each vessel. Bilateral testing is considered an integral part of a complete examination. Limited examinations for reoccurring indications may be performed as noted.   Left Graft #1: +--------------------+--------+--------+----------+--------+  PSV cm/sStenosisWaveform  Comments +--------------------+--------+--------+----------+--------+ Inflow              110             monophasic         +--------------------+--------+--------+----------+--------+ Proximal Anastomosis        occluded                   +--------------------+--------+--------+----------+--------+ Proximal Graft              occluded                   +--------------------+--------+--------+----------+--------+ Mid Graft                   occluded                   +--------------------+--------+--------+----------+--------+ Distal Graft                occluded                   +--------------------+--------+--------+----------+--------+ Distal Anastomosis          occluded                   +--------------------+--------+--------+----------+--------+ Outflow             18              monophasic         +--------------------+--------+--------+----------+--------+   Summary: Left: Left Femoral artery to posterior tibial bypass graft appears occluded.  See table(s) above for measurements and observations.    Preliminary     Procedures Procedures   Medications Ordered in ED Medications - No data to display  ED Course  I have reviewed the triage vital signs and the nursing notes.  Pertinent labs & imaging results that were available during my care of the patient were reviewed by me and considered in my medical decision making (see chart for details).  Patient seen and examined.  Bedside Doppler used.  Patient  does have a dopplerable dorsalis pedis pulse on the left.  Patient discussed with Dr. Stevie Kern.  Will obtain arterial ultrasound and ABIs.  Will need input from vascular surgery.  At this time, I do not suspect an acutely threatened limb.  Vital signs reviewed and are as follows: BP (!) 182/112 (BP Location: Right Arm)   Pulse 95   Temp 98 F (36.7 C)   Resp 18   SpO2 100%   11:24 AM Spoke with Dr. Edilia Bo regarding arterial duplex results.  Will send someone down to consult on patient.  He is about to go into a case in the OR.  12:32 PM Vascular has seen. Plan admission.    Clinical Course as of 01/06/21 1124  Tue Jan 06, 2021  1112 VAS [place] LOWER EXTREMITY ARTERIAL DUPLEX ([facility] 7a-7p) [AC]    Clinical Course User Index [AC] Sherilyn Cooter   MDM Rules/Calculators/A&P                          Admit.   Final Clinical Impression(s) / ED Diagnoses Final diagnoses:  Pain of left lower extremity  Occlusion of left femorotibial bypass graft, initial encounter [facility])    Rx / DC Orders ED Discharge Orders    None       Renne Crigler, PA-C 01/06/21 1232    Milagros Loll, MD 01/07/21 [phone]

## 2021-01-07 ENCOUNTER — Encounter (HOSPITAL_COMMUNITY)
Admission: EM | Disposition: A | Discharge status: Home / Self Care | Payer: Self-pay | Source: Home / Self Care | Attending: Vascular Surgery

## 2021-01-07 ENCOUNTER — Encounter (HOSPITAL_COMMUNITY): Payer: Self-pay | Admitting: Vascular Surgery

## 2021-01-07 HISTORY — PX: LOWER EXTREMITY ANGIOGRAPHY: CATH118251

## 2021-01-07 LAB — CBC
HCT: 37.3 % — ABNORMAL LOW (ref 39.0–52.0)
HCT: 39.6 % (ref 39.0–52.0)
HCT: 38.5 % — ABNORMAL LOW (ref 39.0–52.0)
Hemoglobin: 11.9 g/dL — ABNORMAL LOW (ref 13.0–17.0)
Hemoglobin: 12.3 g/dL — ABNORMAL LOW (ref 13.0–17.0)
Hemoglobin: 12.6 g/dL — ABNORMAL LOW (ref 13.0–17.0)
MCH: 27.5 pg (ref 26.0–34.0)
MCH: 27.4 pg (ref 26.0–34.0)
MCH: 28.3 pg (ref 26.0–34.0)
MCHC: 31.9 g/dL (ref 30.0–36.0)
MCHC: 31.1 g/dL (ref 30.0–36.0)
MCHC: 32.7 g/dL (ref 30.0–36.0)
MCV: 86.3 fL (ref 80.0–100.0)
MCV: 88.2 fL (ref 80.0–100.0)
MCV: 86.5 fL (ref 80.0–100.0)
Platelets: 353 10*3/uL (ref 150–400)
Platelets: 362 10*3/uL (ref 150–400)
Platelets: 308 10*3/uL (ref 150–400)
RBC: 4.32 MIL/uL (ref 4.22–5.81)
RBC: 4.49 MIL/uL (ref 4.22–5.81)
RBC: 4.45 MIL/uL (ref 4.22–5.81)
RDW: 16.1 % — ABNORMAL HIGH (ref 11.5–15.5)
RDW: 16.3 % — ABNORMAL HIGH (ref 11.5–15.5)
RDW: 16.3 % — ABNORMAL HIGH (ref 11.5–15.5)
WBC: 10 10*3/uL (ref 4.0–10.5)
WBC: 10.4 10*3/uL (ref 4.0–10.5)
WBC: 14.3 10*3/uL — ABNORMAL HIGH (ref 4.0–10.5)
nRBC: 0 % (ref 0.0–0.2)
nRBC: 0 % (ref 0.0–0.2)
nRBC: 0 % (ref 0.0–0.2)

## 2021-01-07 LAB — BASIC METABOLIC PANEL
Anion gap: 10 (ref 5–15)
BUN: 15 mg/dL (ref 8–23)
CO2: 20 mmol/L — ABNORMAL LOW (ref 22–32)
Calcium: 8.7 mg/dL — ABNORMAL LOW (ref 8.9–10.3)
Chloride: 102 mmol/L (ref 98–111)
Creatinine, Ser: 1.18 mg/dL (ref 0.61–1.24)
GFR, Estimated: >60 mL/min (ref >60)
Glucose, Bld: 163 mg/dL — ABNORMAL HIGH (ref 70–99)
Potassium: 4.2 mmol/L (ref 3.5–5.1)
Sodium: 132 mmol/L — ABNORMAL LOW (ref 135–145)

## 2021-01-07 LAB — MRSA PCR SCREENING: MRSA by PCR: NEGATIVE

## 2021-01-07 LAB — HEPARIN LEVEL (UNFRACTIONATED)
Heparin Unfractionated: 0.26 IU/mL — ABNORMAL LOW (ref 0.30–0.70)
Heparin Unfractionated: 0.39 IU/mL (ref 0.30–0.70)
Heparin Unfractionated: 0.28 IU/mL — ABNORMAL LOW (ref 0.30–0.70)
Heparin Unfractionated: 0.11 IU/mL — ABNORMAL LOW (ref 0.30–0.70)

## 2021-01-07 LAB — GLUCOSE, CAPILLARY
Glucose-Capillary: 258 mg/dL — ABNORMAL HIGH (ref 70–99)
Glucose-Capillary: 181 mg/dL — ABNORMAL HIGH (ref 70–99)
Glucose-Capillary: 163 mg/dL — ABNORMAL HIGH (ref 70–99)
Glucose-Capillary: 211 mg/dL — ABNORMAL HIGH (ref 70–99)

## 2021-01-07 LAB — FIBRINOGEN
Fibrinogen: 422 mg/dL (ref 210–475)
Fibrinogen: 238 mg/dL (ref 210–475)

## 2021-01-07 SURGERY — LOWER EXTREMITY ANGIOGRAPHY
Anesthesia: LOCAL

## 2021-01-07 MED ORDER — FENTANYL CITRATE (PF) 100 MCG/2ML IJ SOLN
INTRAMUSCULAR | Status: DC | PRN
  Administered 2021-01-07: 25 ug via INTRAVENOUS

## 2021-01-07 MED ORDER — SODIUM CHLORIDE 0.9% FLUSH
3.0000 mL | Freq: Two times a day (BID) | INTRAVENOUS | Status: DC
  Administered 2021-01-07 – 2021-01-11 (×8): 3 mL via INTRAVENOUS

## 2021-01-07 MED ORDER — HEPARIN (PORCINE) IN NACL 1000-0.9 UT/500ML-% IV SOLN
INTRAVENOUS | Status: DC | PRN
  Administered 2021-01-07 (×2): 500 mL

## 2021-01-07 MED ORDER — CHLORHEXIDINE GLUCONATE CLOTH 2 % EX PADS
6.0000 | MEDICATED_PAD | Freq: Every day | CUTANEOUS | Status: DC
  Administered 2021-01-07 – 2021-01-12 (×5): 6 via TOPICAL

## 2021-01-07 MED ORDER — INSULIN GLARGINE 100 UNIT/ML ~~LOC~~ SOLN: 10.0000 [IU] | Freq: Every day | SUBCUTANEOUS | Status: DC

## 2021-01-07 MED ORDER — IODIXANOL 320 MG/ML IV SOLN
INTRAVENOUS | Status: DC | PRN
  Administered 2021-01-07: 10 mL via INTRA_ARTERIAL

## 2021-01-07 MED ORDER — FENTANYL CITRATE (PF) 100 MCG/2ML IJ SOLN
INTRAMUSCULAR | Status: AC
  Filled 2021-01-07: qty 2

## 2021-01-07 MED ORDER — HEPARIN (PORCINE) 25000 UT/250ML-% IV SOLN
500.0000 [IU]/h | INTRAVENOUS | Status: DC
  Filled 2021-01-07: qty 250

## 2021-01-07 MED ORDER — INSULIN ASPART 100 UNIT/ML ~~LOC~~ SOLN
0.0000 [IU] | Freq: Three times a day (TID) | SUBCUTANEOUS | Status: DC
  Administered 2021-01-07: 16:00:00 3 [IU] via SUBCUTANEOUS
  Administered 2021-01-08: 11:00:00 2 [IU] via SUBCUTANEOUS
  Administered 2021-01-09: 3 [IU] via SUBCUTANEOUS
  Administered 2021-01-09: 5 [IU] via SUBCUTANEOUS
  Administered 2021-01-09: 2 [IU] via SUBCUTANEOUS
  Administered 2021-01-10: 8 [IU] via SUBCUTANEOUS
  Administered 2021-01-10: 2 [IU] via SUBCUTANEOUS
  Administered 2021-01-10: 3 [IU] via SUBCUTANEOUS
  Administered 2021-01-11: 5 [IU] via SUBCUTANEOUS
  Administered 2021-01-11: 8 [IU] via SUBCUTANEOUS
  Administered 2021-01-11 – 2021-01-12 (×2): 5 [IU] via SUBCUTANEOUS
  Administered 2021-01-12: 11 [IU] via SUBCUTANEOUS

## 2021-01-07 MED ORDER — MIDAZOLAM HCL 2 MG/2ML IJ SOLN
INTRAMUSCULAR | Status: AC
  Filled 2021-01-07: qty 2

## 2021-01-07 MED ORDER — MORPHINE SULFATE (PF) 4 MG/ML IV SOLN
5.0000 mg | INTRAVENOUS | Status: DC | PRN
  Administered 2021-01-07: 5 mg via INTRAVENOUS
  Filled 2021-01-07: qty 2

## 2021-01-07 MED ORDER — MIDAZOLAM HCL 2 MG/2ML IJ SOLN
INTRAMUSCULAR | Status: DC | PRN
  Administered 2021-01-07: 1 mg via INTRAVENOUS

## 2021-01-07 MED ORDER — LIDOCAINE HCL (PF) 1 % IJ SOLN
INTRAMUSCULAR | Status: AC
  Filled 2021-01-07: qty 30

## 2021-01-07 MED ORDER — INSULIN ASPART 100 UNIT/ML ~~LOC~~ SOLN
0.0000 [IU] | Freq: Every day | SUBCUTANEOUS | Status: DC
  Administered 2021-01-07: 21:00:00 2 [IU] via SUBCUTANEOUS

## 2021-01-07 MED ORDER — SODIUM CHLORIDE 0.9% FLUSH: 3.0000 mL | INTRAVENOUS | Status: DC | PRN

## 2021-01-07 MED ORDER — INSULIN GLARGINE 100 UNIT/ML ~~LOC~~ SOLN
14.0000 [IU] | Freq: Every day | SUBCUTANEOUS | Status: DC
  Administered 2021-01-08 – 2021-01-12 (×5): 14 [IU] via SUBCUTANEOUS
  Filled 2021-01-07 (×6): qty 0.14

## 2021-01-07 MED ORDER — SODIUM CHLORIDE 0.9 % IV SOLN
1.0000 mg/h | INTRAVENOUS | Status: DC
  Filled 2021-01-07 (×2): qty 10

## 2021-01-07 MED ORDER — LIDOCAINE HCL (PF) 1 % IJ SOLN
INTRAMUSCULAR | Status: DC | PRN
  Administered 2021-01-07: 12 mL

## 2021-01-07 MED ORDER — MIDAZOLAM HCL 2 MG/2ML IJ SOLN: 1.0000 mg | INTRAMUSCULAR | Status: DC | PRN

## 2021-01-07 MED ORDER — SODIUM CHLORIDE 0.9 % IV SOLN
1.0000 mg/h | INTRAVENOUS | Status: DC
  Administered 2021-01-07 – 2021-01-08 (×3): 1 mg/h
  Filled 2021-01-07 (×8): qty 10

## 2021-01-07 MED ORDER — SODIUM CHLORIDE 0.9 % IV SOLN: INTRAVENOUS | Status: DC

## 2021-01-07 MED ORDER — HEPARIN (PORCINE) IN NACL 1000-0.9 UT/500ML-% IV SOLN
INTRAVENOUS | Status: AC
  Filled 2021-01-07: qty 1000

## 2021-01-07 MED ORDER — SODIUM CHLORIDE 0.9 % IV SOLN: 250.0000 mL | INTRAVENOUS | Status: DC | PRN

## 2021-01-07 MED ORDER — ONDANSETRON HCL 4 MG/2ML IJ SOLN: 4.0000 mg | Freq: Four times a day (QID) | INTRAMUSCULAR | Status: DC | PRN

## 2021-01-07 MED ORDER — HEPARIN (PORCINE) 25000 UT/250ML-% IV SOLN
500.0000 [IU]/h | INTRAVENOUS | Status: DC
  Administered 2021-01-07: 500 [IU]/h via INTRAVENOUS
  Filled 2021-01-07: qty 250

## 2021-01-07 SURGICAL SUPPLY — 15 items
CATH 0.018 NAVICROSS ANG 135 (CATHETERS) IMPLANT
CATH INFUS 135CMX50CM (CATHETERS) ×1 IMPLANT
CATH OMNI FLUSH 5F 65CM (CATHETERS) ×1 IMPLANT
CATH QUICKCROSS .035X135CM (MICROCATHETER) ×1 IMPLANT
GUIDEWIRE ANGLED .035X260CM (WIRE) ×1 IMPLANT
KIT MICROPUNCTURE NIT STIFF (SHEATH) ×1 IMPLANT
KIT PV (KITS) ×2 IMPLANT
SHEATH PINNACLE 5F 10CM (SHEATH) ×1 IMPLANT
SHEATH PINNACLE ST 6F 45CM (SHEATH) ×1 IMPLANT
SHEATH PROBE COVER 6X72 (BAG) ×1 IMPLANT
TRANSDUCER W/STOPCOCK (MISCELLANEOUS) ×2 IMPLANT
TRAY PV CATH (CUSTOM PROCEDURE TRAY) ×2 IMPLANT
WIRE AMPLATZ SS-J .035X260CM (WIRE) ×1 IMPLANT
WIRE ROSEN-J .035X260CM (WIRE) ×1 IMPLANT
WIRE STARTER BENTSON 035X150 (WIRE) ×1 IMPLANT

## 2021-01-07 NOTE — Progress Notes (Signed)
Patient transporting to cath lab. Patient brought belongings in a patient bag with him. Telemetry box removed, CCMD notified.

## 2021-01-07 NOTE — Progress Notes (Signed)
[facility] [place]  Subjective  - states left foot is numb   Objective 134/66 77 97.6 F (36.4 C) (Oral) 16 97%  Intake/Output Summary (Last 24 hours) at 01/07/2021 1100 Last data filed at 01/07/2021 0700 Gross per 24 hour  Intake 749.5 ml  Output 350 ml  Net 399.5 ml    Palpable femoral pulses bilaterally Left foot motor intact, decreased sensation  Laboratory Lab Results: Recent Labs    01/06/21 1209 01/07/21 0028  WBC 10.6* 10.0  HGB 13.0 11.9*  HCT 41.8 37.3*  PLT 351 353   BMET Recent Labs    01/06/21 0920 01/07/21 0028  NA 130* 132*  K 4.9 4.2  CL 99 102  CO2 21* 20*  GLUCOSE 382* 163*  BUN 14 15  CREATININE 1.04 1.18  CALCIUM 9.5 8.7*    COAG Lab Results  Component Value Date   INR 1.0 01/06/2021   INR 1.1 08/07/2020   INR 1.2 08/04/2020   No results found for: PTT  Assessment/Planning:  Plan aortogram, left lower extremity arteriogram, and attempted thrombolysis for occluded bypass today with Dr. Lenell Antu.  I discussed with the patient this morning I think this is his best option to try and salvage bypass.  He does realize he is at high risk for limb loss.  Cephus Shelling 01/07/2021 11:00 AM --

## 2021-01-07 NOTE — Op Note (Addendum)
DATE OF SERVICE: 01/07/2021  PATIENT:  Robert Sanchez  65 y.o. male  PRE-OPERATIVE DIAGNOSIS:  Thrombosis of left lower extremity fem-pop bypass  POST-OPERATIVE DIAGNOSIS:  Same  PROCEDURE:   1) US guided right common femoral artery access 2) Aortogram 3) Left lower extremity angiogram with third order cannulation (63mL total contrast) 4) Conscious sedation (37 minutes) 5) Initiation of intra-arterial thrombolysis   SURGEON:  Surgeon(s) and Role:    * Leonie Douglas, MD - Primary  ASSISTANT: none  ANESTHESIA:   local and IV sedation  EBL: min  BLOOD ADMINISTERED:none  DRAINS: none   LOCAL MEDICATIONS USED:  LIDOCAINE   SPECIMEN:  none  COUNTS: confirmed correct.  TOURNIQUET:  None  PATIENT DISPOSITION:  PACU - hemodynamically stable.   Delay start of Pharmacological VTE agent (>24hrs) due to surgical blood loss or risk of bleeding: no  INDICATION FOR PROCEDURE: SIRE POET is a 65 y.o. male with thrombosed left fem-pop bypass. After careful discussion of risks, benefits, and alternatives the patient was offered angiography with possible thrombolysis. We specifically discussed access site complications. The patient understood and wished to proceed.  OPERATIVE FINDINGS:  Thrombosed left femoral - popliteal bypass Able to cross the thrombosed bypass and confirm patency of infrageniculate native arteries 50cm Uni-fuse catheter placed across the proximal anastomosis into the graft  DESCRIPTION OF PROCEDURE: After identification of the patient in the pre-operative holding area, the patient was transferred to the operating room. The patient was positioned supine on the operating room table. Anesthesia was induced. The groins was prepped and draped in standard fashion. A surgical pause was performed confirming correct patient, procedure, and operative location.  The right groin was anesthetized with subcutaneous injection of 1% lidocaine. Using ultrasound guidance,  the right common femoral artery was accessed with micropuncture technique. Fluoroscopy was used to confirm cannulation over the femoral head. Sheathogram was not performed. The 750F sheath was upsized to 50F.   An 035 glidewire advantage was advanced into the distal aorta. Over the wire an omni flush catheter was advanced to the level of L2. Aortogram was performed - see above for details.   The left common iliac artery was selected with the 035 glidewire advantage. The wire was advanced into the common femoral artery. Over the wire the omni flush catheter was advanced into the external iliac artery. Selective angiography was performed - see above for details.   The decision was made to intervene. The patient was heparinized with 3000 additional units of heparin. The sheath was exchanged for a 51F x 45cm sheath.  An angled Glidewire was advanced into the below-knee popliteal artery and posterior tibial artery.  Over the wire a quick cross catheter was navigated into the below-knee popliteal artery.  An angiogram was performed to confirm patency of the distal vessels.  A 50 cm UniFuse thrombolysis catheter was advanced over the wire.  The proximal aspect of the catheter was placed immediately proximal to the proximal anastomosis.  Thrombolysis was initiated.  The patient was transferred to the ICU with the sheath and catheter in place.    PLAN: Return to cath lab in 24 hours for catheter check.  Continue therapeutic heparin infusion intravenously.  Continue nontitrating heparin infusion through the intra-arterial sheath at 500 units an hour.  Initiate thrombolysis through the catheter at a rate of 1 mg/h.  Rande Brunt. Lenell Antu, MD [facility] Phone Number: [phone] 01/07/2021 1:12 PM

## 2021-01-07 NOTE — Progress Notes (Addendum)
ANTICOAGULATION CONSULT NOTE   Pharmacy Consult for heparin Indication: Occluded left lower extremity bypass graft  Allergies  Allergen Reactions  . Lisinopril Other (See Comments)    Syncope  . Codeine Rash    Patient Measurements: Height: 5\' 8"  (172.7 cm) Weight: 100 kg (220 lb 7.4 oz) IBW/kg (Calculated) : 68.4 Heparin Dosing Weight: 91kg  Vital Signs: Temp: 97.9 F (36.6 C) (03/09 1600) Temp Source: Oral (03/09 1600) BP: 135/72 (03/09 1700) Pulse Rate: 79 (03/09 1700)  Labs: Recent Labs    01/06/21 0920 01/06/21 1209 01/06/21 1907 01/07/21 0028 01/07/21 0834 01/07/21 1607  HGB 12.5* 13.0  --  11.9*  --  12.3*  HCT 40.9 41.8  --  37.3*  --  39.6  PLT 384 351  --  353  --  362  LABPROT  --  13.2  --   --   --   --   INR  --  1.0  --   --   --   --   HEPARINUNFRC  --   --    < > 0.26* 0.39 0.28*  CREATININE 1.04  --   --  1.18  --   --    < > = values in this interval not displayed.    Estimated Creatinine Clearance: 74.4 mL/min (by C-G formula based on SCr of 1.18 mg/dL).   Assessment: 74 YOM presenting with left leg pain, hx of PAD s/p bypass + iliac stent on DAPT PTA.  CT showing bypass graft occlusion.  Chronic anemia.  Patient returned from procedure with alteplase 1mg /hr and heparion 500 units/hr running through sheath.  Systemic heparin was empirically reduced to 1200 units/hr because he was therapeutic this AM on 1900 units/hr.  Heparin level this evening is therapeutic at 0.28 units/mL.  No bleeding observed.  Goal of Therapy:  Heparin level 0.2-0.5 units/mL with alteplase Monitor platelets by anticoagulation protocol: Yes   Plan:  Continue systemic heparin at 1200 units/hr with 500 units/hr infusing through sheath Alteplase 1mg /hr per MD Q6hr fibrinogen, CBC and heparin level  Monitor s/s bleeding  Call VVS if fibrinogen drops < 150  Jelesa Mangini D. , PharmD, BCPS, BCCCP 01/07/2021, 5:57 PM  ===============================  Addendum: Heparin  level trended down to 0.11 units/mL, sub-therapeutic No issues with infusion, infusion sites; no bleeding per discussion with RN. CBC stable Fibrinogen 422 > 238 Increase heparin gtt to 1300 units/hr F/U repeat levels in 6 hours  Esteen Delpriore D. Laney Potash, PharmD, BCPS, BCCCP 01/07/2021, 9:54 PM

## 2021-01-07 NOTE — Progress Notes (Signed)
ANTICOAGULATION CONSULT NOTE - Follow Up Consult  Pharmacy Consult for heparin Indication: occluded left lower extremity bypass graft  Labs: Recent Labs    01/06/21 0920 01/06/21 1209 01/06/21 1907 01/07/21 0028  HGB 12.5* 13.0  --  11.9*  HCT 40.9 41.8  --  37.3*  PLT 384 351  --  353  LABPROT  --  13.2  --   --   INR  --  1.0  --   --   HEPARINUNFRC  --   --  <0.10* 0.26*  CREATININE 1.04  --   --  1.18    Assessment: 65yo male subtherapeutic on heparin after rate change, lab drawn 3h after bolus; no gtt issues or signs of bleeding per RN.  Goal of Therapy:  Heparin level 0.3-0.7 units/ml   Plan:  Will increase heparin gtt by 2 units/kg/hr to 1900 units/hr and check level in 6 hours.    Vernard Gambles, PharmD, BCPS  01/07/2021,2:15 AM

## 2021-01-07 NOTE — Progress Notes (Signed)
Orthopedic Tech Progress Note Patient Details:  Robert Sanchez [birth year] [id number] Applied knee immobilizer to LLE. Ortho Devices Type of Ortho Device: Knee Immobilizer Ortho Device/Splint Location: LLE Ortho Device/Splint Interventions: Application,Ordered   Post Interventions Patient Tolerated: Well Instructions Provided: Adjustment of device   Kerry Fort 01/07/2021, 4:05 PM

## 2021-01-07 NOTE — Progress Notes (Signed)
Inpatient Diabetes Program Recommendations  AACE/ADA: New Consensus Statement on Inpatient Glycemic Control  Target Ranges:  Prepandial:   less than 140 mg/dL      Peak postprandial:   less than 180 mg/dL (1-2 hours)      Critically ill patients:  140 - 180 mg/dL   Results for Robert Sanchez, Robert Sanchez (MRN [id number]) as of 01/07/2021 07:44  Ref. Range 01/06/2021 16:10 01/06/2021 21:54 01/07/2021 06:46  Glucose-Capillary Latest Ref Range: 70 - 99 mg/dL 947 (H) 096 (H) 283 (H)  Results for Robert Sanchez, Robert Sanchez (MRN [id number]) as of 01/07/2021 07:44  Ref. Range 06/27/2020 12:16 01/06/2021 12:15  Hemoglobin A1C Latest Ref Range: 4.8 - 5.6 % 9.3 (H) 12.7 (H)   Review of Glycemic Control  Diabetes history: DM2 Outpatient Diabetes medications: Metformin 1000 mg BID, Glipizide 10 mg BID Current orders for Inpatient glycemic control: Novolog 0-15 units TID with meals, Metformin 1000 mg BID, Glipizide 10 mg BID   Inpatient Diabetes Program Recommendations:    Insulin: While inpatient, please discontinue Glipizide and Metformin and order Lantus 10 units Q24H and add Novolog 0-5 units QHS.  HbgA1C: A1C 12.7% on 01/06/21 indicating an average glucose of 318 mg/dl over the past 2-3 months.   Thanks, Orlando Penner, RN, MSN, CDE Diabetes Coordinator Inpatient Diabetes Program [phone] (Team Pager from 8am to 5pm)

## 2021-01-07 NOTE — Progress Notes (Signed)
Continued education provided to pt on leaving right leg still and not moving. Knee immobilizer applied. Pt reports "I  cant keep my leg still, its impossible". Education provided and support provided to pt.

## 2021-01-07 NOTE — Progress Notes (Signed)
ANTICOAGULATION CONSULT NOTE   Pharmacy Consult for heparin Indication: Occluded left lower extremity bypass graft  Allergies  Allergen Reactions  . Lisinopril Other (See Comments)    Syncope  . Codeine Rash    Patient Measurements: Height: 5\' 8"  (172.7 cm) Weight: 100 kg (220 lb 7.4 oz) IBW/kg (Calculated) : 68.4 Heparin Dosing Weight: 91kg  Vital Signs: Temp: 97.9 F (36.6 C) (03/09 1539) Temp Source: Oral (03/09 1539) BP: 131/64 (03/09 1500) Pulse Rate: 69 (03/09 1500)  Labs: Recent Labs    01/06/21 0920 01/06/21 1209 01/06/21 1907 01/07/21 0028 01/07/21 0834  HGB 12.5* 13.0  --  11.9*  --   HCT 40.9 41.8  --  37.3*  --   PLT 384 351  --  353  --   LABPROT  --  13.2  --   --   --   INR  --  1.0  --   --   --   HEPARINUNFRC  --   --  <0.10* 0.26* 0.39  CREATININE 1.04  --   --  1.18  --     Estimated Creatinine Clearance: 74.4 mL/min (by C-G formula based on SCr of 1.18 mg/dL).   Medical History: Past Medical History:  Diagnosis Date  . Acute pulmonary edema (HCC) 06/05/2018  . Arthritis   . COPD (chronic obstructive pulmonary disease) (HCC)    " beginning stages " per pt  . Coronary artery disease   . Diabetes mellitus without complication (HCC)    type 2  . Diabetic neuropathy (HCC)   . Diabetic neuropathy (HCC)   . Emphysema/COPD (HCC)    " beginning stages"  . GERD (gastroesophageal reflux disease)   . HLD (hyperlipidemia)   . HOH (hard of hearing)    no hearing aids  . Myocardial infarction (HCC)   . Peripheral vascular disease (HCC)    Assessment: 69 YOM presenting with left leg pain, hx of PAD s/p bypass + iliac stent on DAPT PTA.  CT showing bypass graft occlusion.  Chronic anemia hemoglobin down this morning from 13>11.9, plt stable and normal.   Patient returned from procedure with alteplase 1mg /hr and heparion 500 uts/hr running through sheath.  Plan for systemic heparin also with goal HL 0.2-0.5.   Heparin drip 1900 uts/hr with HL  0.39 this am  Will decrease systemic heparin drip to 1200 uts/hr for total 1700 uts/hr while on alteplase to keep on lower end therapeutic range and minimize bleeding risk.    Goal of Therapy:  Heparin level 0.2-0.5  Monitor platelets by anticoagulation protocol: Yes   Plan:  Decrease systemic Heparin 1700 units/hr Q6hr fibrinogen, CBC and heparin level  Monitor s/s bleeding  Call VVS if fibrinogen drops < 150    68 Pharm.D. CPP, BCPS Clinical Pharmacist [phone] 01/07/2021 3:58 PM

## 2021-01-07 NOTE — Progress Notes (Signed)
ANTICOAGULATION CONSULT NOTE   Pharmacy Consult for heparin Indication: Occluded left lower extremity bypass graft  Allergies  Allergen Reactions  . Lisinopril Other (See Comments)    Syncope  . Codeine Rash    Patient Measurements: Height: 5\' 8"  (172.7 cm) Weight: 100.3 kg (221 lb 1.9 oz) IBW/kg (Calculated) : 68.4 Heparin Dosing Weight: 91kg  Vital Signs: Temp: 97.6 F (36.4 C) (03/09 0748) Temp Source: Oral (03/09 0748) BP: 134/66 (03/09 0748) Pulse Rate: 77 (03/09 0748)  Labs: Recent Labs    01/06/21 0920 01/06/21 1209 01/06/21 1907 01/07/21 0028  HGB 12.5* 13.0  --  11.9*  HCT 40.9 41.8  --  37.3*  PLT 384 351  --  353  LABPROT  --  13.2  --   --   INR  --  1.0  --   --   HEPARINUNFRC  --   --  <0.10* 0.26*  CREATININE 1.04  --   --  1.18    Estimated Creatinine Clearance: 74.5 mL/min (by C-G formula based on SCr of 1.18 mg/dL).   Medical History: Past Medical History:  Diagnosis Date  . Acute pulmonary edema (HCC) 06/05/2018  . Arthritis   . COPD (chronic obstructive pulmonary disease) (HCC)    " beginning stages " per pt  . Coronary artery disease   . Diabetes mellitus without complication (HCC)    type 2  . Diabetic neuropathy (HCC)   . Diabetic neuropathy (HCC)   . Emphysema/COPD (HCC)    " beginning stages"  . GERD (gastroesophageal reflux disease)   . HLD (hyperlipidemia)   . HOH (hard of hearing)    no hearing aids  . Myocardial infarction (HCC)   . Peripheral vascular disease (HCC)    Assessment: 8 YOM presenting with left leg pain, hx of PAD s/p bypass + iliac stent on DAPT PTA.  CT showing bypass graft occlusion.  Chronic anemia hemoglobin down this morning from 13>11.9, plt stable and normal.   Heparin level at goal this morning on heparin infusion. No bleeding issues noted.   Goal of Therapy:  Heparin level 0.3-0.7 units/ml Monitor platelets by anticoagulation protocol: Yes   Plan:  Heparin at 1900 units/hr Daily CBC and  heparin level  VVS plans and long term AC recs  68 PharmD., BCPS Clinical Pharmacist 01/07/2021 7:50 AM

## 2021-01-08 ENCOUNTER — Inpatient Hospital Stay (HOSPITAL_COMMUNITY)
Admission: EM | Disposition: A | Discharge status: Home / Self Care | Payer: Self-pay | Source: Home / Self Care | Attending: Vascular Surgery

## 2021-01-08 HISTORY — PX: PERIPHERAL VASCULAR INTERVENTION: CATH118257

## 2021-01-08 HISTORY — PX: PULMONARY THROMBECTOMY: CATH118295

## 2021-01-08 LAB — CBC
HCT: 36.8 % — ABNORMAL LOW (ref 39.0–52.0)
HCT: 38.1 % — ABNORMAL LOW (ref 39.0–52.0)
Hemoglobin: 11.6 g/dL — ABNORMAL LOW (ref 13.0–17.0)
Hemoglobin: 11.9 g/dL — ABNORMAL LOW (ref 13.0–17.0)
MCH: 27.8 pg (ref 26.0–34.0)
MCH: 27.7 pg (ref 26.0–34.0)
MCHC: 31.5 g/dL (ref 30.0–36.0)
MCHC: 31.2 g/dL (ref 30.0–36.0)
MCV: 88 fL (ref 80.0–100.0)
MCV: 88.6 fL (ref 80.0–100.0)
Platelets: 247 10*3/uL (ref 150–400)
Platelets: 235 10*3/uL (ref 150–400)
RBC: 4.18 MIL/uL — ABNORMAL LOW (ref 4.22–5.81)
RBC: 4.3 MIL/uL (ref 4.22–5.81)
RDW: 16.1 % — ABNORMAL HIGH (ref 11.5–15.5)
RDW: 16.2 % — ABNORMAL HIGH (ref 11.5–15.5)
WBC: 9.6 10*3/uL (ref 4.0–10.5)
WBC: 9.6 10*3/uL (ref 4.0–10.5)
nRBC: 0 % (ref 0.0–0.2)
nRBC: 0 % (ref 0.0–0.2)

## 2021-01-08 LAB — GLUCOSE, CAPILLARY
Glucose-Capillary: 151 mg/dL — ABNORMAL HIGH (ref 70–99)
Glucose-Capillary: 142 mg/dL — ABNORMAL HIGH (ref 70–99)
Glucose-Capillary: 204 mg/dL — ABNORMAL HIGH (ref 70–99)
Glucose-Capillary: 154 mg/dL — ABNORMAL HIGH (ref 70–99)
Glucose-Capillary: 180 mg/dL — ABNORMAL HIGH (ref 70–99)

## 2021-01-08 LAB — FIBRINOGEN
Fibrinogen: 183 mg/dL — ABNORMAL LOW (ref 210–475)
Fibrinogen: 210 mg/dL (ref 210–475)

## 2021-01-08 LAB — BASIC METABOLIC PANEL
Anion gap: 9 (ref 5–15)
BUN: 17 mg/dL (ref 8–23)
CO2: 20 mmol/L — ABNORMAL LOW (ref 22–32)
Calcium: 8.5 mg/dL — ABNORMAL LOW (ref 8.9–10.3)
Chloride: 107 mmol/L (ref 98–111)
Creatinine, Ser: 1.15 mg/dL (ref 0.61–1.24)
GFR, Estimated: >60 mL/min (ref >60)
Glucose, Bld: 159 mg/dL — ABNORMAL HIGH (ref 70–99)
Potassium: 4.7 mmol/L (ref 3.5–5.1)
Sodium: 136 mmol/L (ref 135–145)

## 2021-01-08 LAB — HEPARIN LEVEL (UNFRACTIONATED)
Heparin Unfractionated: 0.2 IU/mL — ABNORMAL LOW (ref 0.30–0.70)
Heparin Unfractionated: 0.23 IU/mL — ABNORMAL LOW (ref 0.30–0.70)

## 2021-01-08 LAB — POCT ACTIVATED CLOTTING TIME: Activated Clotting Time: 166 seconds

## 2021-01-08 SURGERY — PULMONARY THROMBECTOMY
Anesthesia: LOCAL

## 2021-01-08 MED ORDER — LIDOCAINE HCL (PF) 1 % IJ SOLN
INTRAMUSCULAR | Status: AC
  Filled 2021-01-08: qty 30

## 2021-01-08 MED ORDER — HEPARIN (PORCINE) IN NACL 1000-0.9 UT/500ML-% IV SOLN
INTRAVENOUS | Status: DC | PRN
  Administered 2021-01-08 (×2): 500 mL

## 2021-01-08 MED ORDER — ACETAMINOPHEN 325 MG PO TABS: 650.0000 mg | ORAL_TABLET | ORAL | Status: DC | PRN

## 2021-01-08 MED ORDER — INSULIN STARTER KIT- PEN NEEDLES (ENGLISH)
1.0000 | Freq: Once | Status: AC
  Administered 2021-01-08: 1
  Filled 2021-01-08: qty 1

## 2021-01-08 MED ORDER — IODIXANOL 320 MG/ML IV SOLN
INTRAVENOUS | Status: DC | PRN
  Administered 2021-01-08: 75 mL via INTRA_ARTERIAL

## 2021-01-08 MED ORDER — ONDANSETRON HCL 4 MG/2ML IJ SOLN: 4.0000 mg | Freq: Four times a day (QID) | INTRAMUSCULAR | Status: DC | PRN

## 2021-01-08 MED ORDER — SODIUM CHLORIDE 0.9 % IV SOLN
250.0000 mL | INTRAVENOUS | Status: DC | PRN
  Administered 2021-01-08: 250 mL via INTRAVENOUS

## 2021-01-08 MED ORDER — MIDAZOLAM HCL 2 MG/2ML IJ SOLN
INTRAMUSCULAR | Status: DC | PRN
  Administered 2021-01-08: 1 mg via INTRAVENOUS

## 2021-01-08 MED ORDER — LABETALOL HCL 5 MG/ML IV SOLN: 10.0000 mg | INTRAVENOUS | Status: DC | PRN

## 2021-01-08 MED ORDER — FENTANYL CITRATE (PF) 100 MCG/2ML IJ SOLN
INTRAMUSCULAR | Status: AC
  Filled 2021-01-08: qty 2

## 2021-01-08 MED ORDER — HYDRALAZINE HCL 20 MG/ML IJ SOLN: 5.0000 mg | INTRAMUSCULAR | Status: DC | PRN

## 2021-01-08 MED ORDER — HEPARIN SODIUM (PORCINE) 1000 UNIT/ML IJ SOLN
INTRAMUSCULAR | Status: DC | PRN
  Administered 2021-01-08: 6000 [IU] via INTRAVENOUS

## 2021-01-08 MED ORDER — SODIUM CHLORIDE 0.9% FLUSH
3.0000 mL | Freq: Two times a day (BID) | INTRAVENOUS | Status: DC
  Administered 2021-01-08 – 2021-01-11 (×6): 3 mL via INTRAVENOUS

## 2021-01-08 MED ORDER — FENTANYL CITRATE (PF) 100 MCG/2ML IJ SOLN
INTRAMUSCULAR | Status: DC | PRN
  Administered 2021-01-08 (×2): 50 ug via INTRAVENOUS

## 2021-01-08 MED ORDER — SODIUM CHLORIDE 0.9% FLUSH
3.0000 mL | INTRAVENOUS | Status: DC | PRN
  Administered 2021-01-09: 3 mL via INTRAVENOUS

## 2021-01-08 MED ORDER — HEPARIN SODIUM (PORCINE) 1000 UNIT/ML IJ SOLN
INTRAMUSCULAR | Status: AC
  Filled 2021-01-08: qty 1

## 2021-01-08 MED ORDER — HEPARIN (PORCINE) 25000 UT/250ML-% IV SOLN
2400.0000 [IU]/h | INTRAVENOUS | Status: DC
  Administered 2021-01-09: 12:00:00 2100 [IU]/h via INTRAVENOUS
  Administered 2021-01-10: 2400 [IU]/h via INTRAVENOUS
  Administered 2021-01-10: 2300 [IU]/h via INTRAVENOUS
  Administered 2021-01-11: 2400 [IU]/h via INTRAVENOUS
  Filled 2021-01-08 (×8): qty 250

## 2021-01-08 MED ORDER — HEPARIN (PORCINE) IN NACL 1000-0.9 UT/500ML-% IV SOLN
INTRAVENOUS | Status: AC
  Filled 2021-01-08: qty 1000

## 2021-01-08 MED ORDER — SODIUM CHLORIDE 0.9 % IV SOLN: INTRAVENOUS | Status: AC

## 2021-01-08 MED ORDER — MIDAZOLAM HCL 2 MG/2ML IJ SOLN
INTRAMUSCULAR | Status: AC
  Filled 2021-01-08: qty 2

## 2021-01-08 SURGICAL SUPPLY — 14 items
BALLN STERLING OTW 3X60X150 (BALLOONS) ×3
BALLN STERLING OTW 4X60X135 (BALLOONS) ×3
BALLOON STERLING OTW 3X60X150 (BALLOONS) IMPLANT
BALLOON STERLING OTW 4X60X135 (BALLOONS) IMPLANT
CATH CXI SUPP 2.6F 150 ST (CATHETERS) ×1 IMPLANT
DCB RANGER 4.0X60 135 (BALLOONS) IMPLANT
DEVICE CLOSURE MYNXGRIP 6/7F (Vascular Products) ×1 IMPLANT
KIT ENCORE 26 ADVANTAGE (KITS) ×1 IMPLANT
KIT PV (KITS) ×1 IMPLANT
RANGER DCB 4.0X60 135 (BALLOONS) ×6
SHEATH PINNACLE 6F 10CM (SHEATH) ×1 IMPLANT
TRANSDUCER W/STOPCOCK (MISCELLANEOUS) ×1 IMPLANT
TRAY PV CATH (CUSTOM PROCEDURE TRAY) ×1 IMPLANT
WIRE G V18X300CM (WIRE) ×2 IMPLANT

## 2021-01-08 NOTE — Progress Notes (Signed)
ANTICOAGULATION CONSULT NOTE   Pharmacy Consult for heparin Indication: Occluded left lower extremity bypass graft  Allergies  Allergen Reactions  . Lisinopril Other (See Comments)    Syncope  . Codeine Rash    Patient Measurements: Height: 5\' 8"  (172.7 cm) Weight: 100 kg (220 lb 7.4 oz) IBW/kg (Calculated) : 68.4 Heparin Dosing Weight: 91kg  Vital Signs: Temp: 98.5 F (36.9 C) (03/10 0747) Temp Source: Oral (03/10 0747) BP: 117/67 (03/10 0800) Pulse Rate: 73 (03/10 0800)  Labs: Recent Labs    01/06/21 0920 01/06/21 1209 01/06/21 1907 01/07/21 0028 01/07/21 0834 01/07/21 2043 01/08/21 0435 01/08/21 03/10/21 01/08/21 0719 01/08/21 0824  HGB 12.5* 13.0  --  11.9*   < > 12.6* 11.6*  --  11.9*  --   HCT 40.9 41.8  --  37.3*   < > 38.5* 36.8*  --  38.1*  --   PLT 384 351  --  353   < > 308 247  --  235  --   LABPROT  --  13.2  --   --   --   --   --   --   --   --   INR  --  1.0  --   --   --   --   --   --   --   --   HEPARINUNFRC  --   --    < > 0.26*   < > 0.11* 0.20*  --   --  0.23*  CREATININE 1.04  --   --  1.18  --   --   --  1.15  --   --    < > = values in this interval not displayed.    Estimated Creatinine Clearance: 76.3 mL/min (by C-G formula based on SCr of 1.15 mg/dL).   Assessment: Robert Sanchez presenting with left leg pain, hx of PAD s/p bypass + iliac stent on DAPT PTA.  CT showing bypass graft occlusion.  Chronic anemia.  Patient returned from procedure with alteplase 1mg /hr and heparion 500 units/hr running through sheath.  Systemic heparin 1300 units/hr Total heparin infusing 1800 uts/hr with heparin level 0.23 at goal No bleeding observed. CBC stable, fibrinogen stable   Goal of Therapy:  Heparin level 0.2-0.5 units/mL with alteplase Monitor platelets by anticoagulation protocol: Yes   Plan:  Continue systemic heparin at 1300 units/hr Alteplase 1mg /hr per MD Q6hr fibrinogen, CBC and heparin level  Monitor s/s bleeding  Call VVS if fibrinogen  drops < 150    68 Pharm.D. CPP, BCPS Clinical Pharmacist [phone] 01/08/2021 10:34 AM

## 2021-01-08 NOTE — Progress Notes (Signed)
[facility]  Subjective  -had a lot of pain overnight but states his left foot feels better this morning   Objective 117/67 73 98.5 F (36.9 C) (Oral) 10 98%  Intake/Output Summary (Last 24 hours) at 01/08/2021 0918 Last data filed at 01/08/2021 0800 Gross per 24 hour  Intake 3577.48 ml  Output 1200 ml  Net 2377.48 ml    Right femoral sheath clean dry intact with no hematoma Left PT signal now brisk  Laboratory Lab Results: Recent Labs    01/08/21 0435 01/08/21 0719  WBC 9.6 9.6  HGB 11.6* 11.9*  HCT 36.8* 38.1*  PLT 247 235   BMET Recent Labs    01/07/21 0028 01/08/21 0638  NA 132* 136  K 4.2 4.7  CL 102 107  CO2 20* 20*  GLUCOSE 163* 159*  BUN 15 17  CREATININE 1.18 1.15  CALCIUM 8.7* 8.5*    COAG Lab Results  Component Value Date   INR 1.0 01/06/2021   INR 1.1 08/07/2020   INR 1.2 08/04/2020   No results found for: PTT  Assessment/Planning:  Plan left lower extremity thrombolysis check later today for occluded bypass.  He has a more brisk PT signal this morning which is encouraging.  Continue TPA and heparin.  Keep NPO.  Cephus Shelling 01/08/2021 9:18 AM --

## 2021-01-08 NOTE — Progress Notes (Signed)
Inpatient Diabetes Program Recommendations  AACE/ADA: New Consensus Statement on Inpatient Glycemic Control   Target Ranges:  Prepandial:   less than 140 mg/dL      Peak postprandial:   less than 180 mg/dL (1-2 hours)      Critically ill patients:  140 - 180 mg/dL   Results for Robert Sanchez, Robert Sanchez (MRN [id number]) as of 01/08/2021 14:11  Ref. Range 01/07/2021 06:46 01/07/2021 11:56 01/07/2021 15:34 01/07/2021 21:24 01/08/2021 06:14 01/08/2021 11:07  Glucose-Capillary Latest Ref Range: 70 - 99 mg/dL 683 (H) 419 (H) 622 (H) 211 (H) 151 (H) 142 (H)  Results for Robert Sanchez, Robert Sanchez (MRN [id number]) as of 01/08/2021 14:11  Ref. Range 06/27/2020 12:16 01/06/2021 12:15  Hemoglobin A1C Latest Ref Range: 4.8 - 5.6 % 9.3 (H) 12.7 (H)   Review of Glycemic Control  Diabetes history: DM2 Outpatient Diabetes medications: Metformin 1000 mg BID, Glipizide 10 mg BID Current orders for Inpatient glycemic control: Novolog 0-15 units TID with meals, Lantus 14 units daily  Inpatient Diabetes Program Recommendations:    HbgA1C: A1C 12.7% on 01/06/21 indicating an average glucose of 318 mg/dl over the past 2-3 months.   NOTE: Spoke with patient about diabetes and home regimen for diabetes control. Patient reports he goes to Primary Care at [facility] for primary care but notes that he has to pay out of pocket to see providers and he owes them money for prior visits. 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  Patient states that he is consistently taking Metformin and Glipizide BID. Patient reports that providers at [facility] have mentioned putting him on insulin but they have not done so yet. Patient states that he has a glucometer and testing supplies at home but rarely checks his glucose as it is always high.    Discussed A1C results (12.7% on 01/06/21) and explained that current A1C indicates an average glucose of 318 mg/dl  over the past 2-3 months. Discussed glucose and A1C goals. Discussed importance of checking CBGs and maintaining good CBG control to prevent long-term and short-term complications. Explained how hyperglycemia leads to damage within blood vessels which lead to the common complications seen with uncontrolled diabetes. Stressed to the patient the importance of improving glycemic control to prevent further complications from uncontrolled diabetes especially given issue with his leg and to promote healing. Patient reports that he would be willing to take insulin at home if he is able to get it. Discussed using generic insulins which are $25 per vial or $43 per box of 5 insulin pens. Patient states that even generic insulins would be too expensive for him to get at this time due to no insurance and no income. Informed patient that TOC would be consulted and asked to assist him with getting established at a local clinic for insured and medication assistance. Reviewed insulin administration with vial/syringe and insulin pens. Patient states that he feels insulin pens would be better for him. Reviewed all steps if insulin pen including attachment of needle, 2-unit air shot, dialing up dose, giving injection, removing needle, disposal of sharps, storage of unused insulin etc. Patient able to provide successful return demonstration.  Encouraged patient to take DM medications as prescribed, check glucose as directed, and be sure to follow up consistently for primary care clinic.  Patient is anxious about his leg as he is not sure if he will  lose his leg or not an and very concerned about how he will pay for things he needs. Patient verbalized understanding of information discussed and reports no further questions at this time related to diabetes.   Thanks, Orlando Penner, RN, MSN, CDE Diabetes Coordinator Inpatient Diabetes Program [phone] (Team Pager)

## 2021-01-08 NOTE — Progress Notes (Signed)
ANTICOAGULATION CONSULT NOTE   Pharmacy Consult for heparin Indication: Occluded left lower extremity bypass graft  Allergies  Allergen Reactions  . Lisinopril Other (See Comments)    Syncope  . Codeine Rash    Patient Measurements: Height: 5\' 8"  (172.7 cm) Weight: 100 kg (220 lb 7.4 oz) IBW/kg (Calculated) : 68.4 Heparin Dosing Weight: 91kg  Vital Signs: Temp: 98.2 F (36.8 C) (03/09 1932) Temp Source: Oral (03/09 1932) BP: 124/69 (03/10 0500) Pulse Rate: 79 (03/10 0500)  Labs: Recent Labs    01/06/21 0920 01/06/21 1209 01/06/21 1907 01/07/21 0028 01/07/21 0834 01/07/21 1607 01/07/21 2043 01/08/21 0435  HGB 12.5* 13.0  --  11.9*  --  12.3* 12.6* 11.6*  HCT 40.9 41.8  --  37.3*  --  39.6 38.5* 36.8*  PLT 384 351  --  353  --  362 308 247  LABPROT  --  13.2  --   --   --   --   --   --   INR  --  1.0  --   --   --   --   --   --   HEPARINUNFRC  --   --    < > 0.26*   < > 0.28* 0.11* 0.20*  CREATININE 1.04  --   --  1.18  --   --   --   --    < > = values in this interval not displayed.    Estimated Creatinine Clearance: 74.4 mL/min (by C-G formula based on SCr of 1.18 mg/dL).   Assessment: 68 YOM presenting with left leg pain, hx of PAD s/p bypass + iliac stent on DAPT PTA.  CT showing bypass graft occlusion.  Chronic anemia.  Patient returned from procedure with alteplase 1mg /hr and heparion 500 units/hr running through sheath.  Systemic heparin was empirically reduced to 1200 units/hr because he was therapeutic this AM on 1900 units/hr.  Heparin level this evening is therapeutic at 0.28 units/mL.  No bleeding observed.  3/10 AM update:  Heparin level remains within range Hgb down some but ok for now  Goal of Therapy:  Heparin level 0.2-0.5 units/mL with alteplase Monitor platelets by anticoagulation protocol: Yes   Plan:  Continue systemic heparin at 1300 units/hr Alteplase 1mg /hr per MD Q6hr fibrinogen, CBC and heparin level  Monitor s/s bleeding   Call VVS if fibrinogen drops < 150  68, PharmD, BCPS Clinical Pharmacist Phone: [phone]

## 2021-01-08 NOTE — Progress Notes (Signed)
RN filled out consent form for procedure today. Pt stated "I know they are going to do something, but Dr. Chestine Spore said he would come by and explain it to me". Therefore, consent form is at bedside, waiting signatures.

## 2021-01-08 NOTE — Progress Notes (Signed)
ANTICOAGULATION CONSULT NOTE   Pharmacy Consult for heparin Indication: Occluded left lower extremity bypass graft  Allergies  Allergen Reactions  . Lisinopril Other (See Comments)    Syncope  . Codeine Rash    Patient Measurements: Height: 5\' 8"  (172.7 cm) Weight: 100 kg (220 lb 7.4 oz) IBW/kg (Calculated) : 68.4 Heparin Dosing Weight: 91kg  Vital Signs: Temp: 98.2 F (36.8 C) (03/10 1109) Temp Source: Oral (03/10 1109) BP: 122/70 (03/10 1400) Pulse Rate: 75 (03/10 1400)  Labs: Recent Labs    01/06/21 0920 01/06/21 1209 01/06/21 1907 01/07/21 0028 01/07/21 0834 01/07/21 2043 01/08/21 0435 01/08/21 03/10/21 01/08/21 0719 01/08/21 0824  HGB 12.5* 13.0  --  11.9*   < > 12.6* 11.6*  --  11.9*  --   HCT 40.9 41.8  --  37.3*   < > 38.5* 36.8*  --  38.1*  --   PLT 384 351  --  353   < > 308 247  --  235  --   LABPROT  --  13.2  --   --   --   --   --   --   --   --   INR  --  1.0  --   --   --   --   --   --   --   --   HEPARINUNFRC  --   --    < > 0.26*   < > 0.11* 0.20*  --   --  0.23*  CREATININE 1.04  --   --  1.18  --   --   --  1.15  --   --    < > = values in this interval not displayed.    Estimated Creatinine Clearance: 76.3 mL/min (by C-G formula based on SCr of 1.15 mg/dL).   Assessment: 13 YOM presenting with left leg pain, hx of PAD s/p bypass + iliac stent on DAPT PTA.  CT showing bypass graft occlusion.  Chronic anemia.  Patient is s/p lysis.  Currently off both alteplase and IV heparin.  Pharmacy received verbal order from Vascular Surgeon to resume IV heparin 8 hours post procedure; no bolus and can liberalize heparin goal since alteplase is off.  Previously therapeutic on 1900 units/hr.  Goal of Therapy:  Heparin level 0.3 - 0.7 units/mL Monitor platelets by anticoagulation protocol: Yes   Plan:  At 0100 on 3/11, restart IV heparin at 1900 units/hr - no bolus per MD Check 6 hr heparin level Daily heparin level and CBC Monitor closely for s/sx of  bleeding  Renate Danh D. 12-08-1980, PharmD, BCPS, BCCCP 01/08/2021, 5:19 PM

## 2021-01-08 NOTE — Op Note (Signed)
Patient name: Robert Sanchez MRN: [id number] DOB: 14-Dec-1955 Sex: male  01/08/2021 Pre-operative Diagnosis: Critical limb ischemia of left lower extremity with occluded left common femoral to BK popliteal/TP trunk bypass Post-operative diagnosis:  Same Surgeon:  [name], MD Procedure Performed: 1.  Thrombolytics catheter check of the left lower extremity with left lower extremity arteriogram 2.  Angioplasty of distal bypass anastomosis onto the below-knee popliteal artery and TP trunk including drug-coated balloon angioplasty (3 mm x 60 mm Sterling, 4 mm x 60 mm Sterling, and 4 mm x 60 mm Ranger) 3.  Mynx closure of the right common femoral artery 4.  49 minutes of monitored moderate conscious sedation time  Indications: 65 year old male that has had a complex history of left leg revascularization.  Most recently underwent left common femoral to below-knee popliteal/TP trunk bypass with PTFE.  Ultimately presented to the ED earlier this week with occluded bypass.  He underwent placement of thrombolytic's catheter yesterday.  He presents for thrombolytics catheter check after risks and benefits discussed.  Findings:   The bypass was patent after thrombolysis overnight.  There was patent two-vessel runoff through the peroneal and posterior tibial with no high-grade stenosis.  I had difficulty crossing the distal anastomosis onto the BK popliteal artery/TP trunk and the TP trunk was heavily diseased and appeared to have a high-grade stenosis greater than 70% from calcification.  Ultimately I treated this with a 3 mm Sterling and 4 mm Sterling and 4 mm drug-coated Ranger.  Excellent results with inline flow down the left lower extremity and widely patent bypass with preserved two vessel runoff.  We did closely evaluate the left common femoral artery but on further oblique imaging this did not appear to have any significant stenosis in the common femoral and is likely just where the hood  of the bypass comes off the common femoral artery.  Thrombolytics catheter was removed and mynx closure was placed in the right common femoral artery   Procedure:  The patient was identified in the holding area and taken to room 8.  The patient was then placed supine on the table and prepped and draped in the usual sterile fashion.  A time out was called.  Ultimately I removed the inner wire of the thrombolytics catheter that was placed down the left leg bypass.  I placed a V 18 wire into this Unifuse catheter and I could not get the wire to cross the distal anastomosis and met resistance.  I then ultimately removed the Unifuse catheter and used a CXI catheter in order to manipulate my wire to cross the distal anastomosis and finally got a wire down into the peroneal artery.  We confirmed with hand-injection we were in the true lumen distally.  I then ultimately performed angiogram of the entire left lower extremity that showed the bypass was now patent after thrombolysis.  I suspect the issue was the distal anastomosis onto the TP trunk that was heavily diseased with a greater than 70% calcified stenosis.  I elected to treat this with a 3 mm Sterling and then 4 mm Sterling to nominal pressure for 2 minutes and finally a 4 mm drug-coated Ranger for 3 minutes.  We had excellent results and he now has inline flow down the left leg with a widely patent bypass and a widely patent TP trunk with two vessel runoff.  We did closely examine the common femoral where there was a suspicion for stenosis proximally but I did not feel  that this was flow-limiting after evaluating the oblique images.  A limited right groin arteriogram was evaluated and then we exchanged for a short 6 French sheath after removing all wires and catheters and a mynx closure device was deployed in the right groin.  He remained stable.  Plan: Would like to resume heparin in 8 hours without a bolus with pharmacy.  [name], MD [facility] [place] Office: [phone]

## 2021-01-09 ENCOUNTER — Encounter (HOSPITAL_COMMUNITY): Payer: Self-pay | Admitting: Vascular Surgery

## 2021-01-09 ENCOUNTER — Other Ambulatory Visit (HOSPITAL_COMMUNITY): Payer: Self-pay | Admitting: Vascular Surgery

## 2021-01-09 LAB — CBC
HCT: 34.5 % — ABNORMAL LOW (ref 39.0–52.0)
Hemoglobin: 10.7 g/dL — ABNORMAL LOW (ref 13.0–17.0)
MCH: 27.6 pg (ref 26.0–34.0)
MCHC: 31 g/dL (ref 30.0–36.0)
MCV: 89.1 fL (ref 80.0–100.0)
Platelets: 219 10*3/uL (ref 150–400)
RBC: 3.87 MIL/uL — ABNORMAL LOW (ref 4.22–5.81)
RDW: 16.2 % — ABNORMAL HIGH (ref 11.5–15.5)
WBC: 9.9 10*3/uL (ref 4.0–10.5)
nRBC: 0 % (ref 0.0–0.2)

## 2021-01-09 LAB — HEPARIN LEVEL (UNFRACTIONATED)
Heparin Unfractionated: 0.13 IU/mL — ABNORMAL LOW (ref 0.30–0.70)
Heparin Unfractionated: 0.2 IU/mL — ABNORMAL LOW (ref 0.30–0.70)

## 2021-01-09 LAB — GLUCOSE, CAPILLARY
Glucose-Capillary: 129 mg/dL — ABNORMAL HIGH (ref 70–99)
Glucose-Capillary: 209 mg/dL — ABNORMAL HIGH (ref 70–99)
Glucose-Capillary: 221 mg/dL — ABNORMAL HIGH (ref 70–99)
Glucose-Capillary: 178 mg/dL — ABNORMAL HIGH (ref 70–99)
Glucose-Capillary: 160 mg/dL — ABNORMAL HIGH (ref 70–99)

## 2021-01-09 MED ORDER — RIVAROXABAN 20 MG PO TABS: 20.0000 mg | ORAL_TABLET | Freq: Every day | ORAL | 6 refills | Status: DC

## 2021-01-09 MED ORDER — APIXABAN 2.5 MG PO TABS: 2.5000 mg | ORAL_TABLET | Freq: Two times a day (BID) | ORAL | 0 refills | Status: DC

## 2021-01-09 MED FILL — Lidocaine HCl Local Preservative Free (PF) Inj 1%: INTRAMUSCULAR | Qty: 30 | Status: AC

## 2021-01-09 NOTE — Progress Notes (Signed)
ANTICOAGULATION CONSULT NOTE   Pharmacy Consult for heparin Indication: Occluded left lower extremity bypass graft  Allergies  Allergen Reactions  . Lisinopril Other (See Comments)    Syncope  . Codeine Rash    Patient Measurements: Height: 5\' 8"  (172.7 cm) Weight: 99.5 kg (219 lb 5.7 oz) IBW/kg (Calculated) : 68.4 Heparin Dosing Weight: 91kg  Vital Signs: Temp: 98.6 F (37 C) (03/11 0718) Temp Source: Oral (03/11 0718) BP: 136/71 (03/11 0700) Pulse Rate: 89 (03/11 0700)  Labs: Recent Labs    01/06/21 0920 01/06/21 1209 01/06/21 1907 01/07/21 0028 01/07/21 0834 01/08/21 0435 01/08/21 03/10/21 01/08/21 0719 01/08/21 0824 01/09/21 0636  HGB 12.5* 13.0  --  11.9*   < > 11.6*  --  11.9*  --  10.7*  HCT 40.9 41.8  --  37.3*   < > 36.8*  --  38.1*  --  34.5*  PLT 384 351  --  353   < > 247  --  235  --  219  LABPROT  --  13.2  --   --   --   --   --   --   --   --   INR  --  1.0  --   --   --   --   --   --   --   --   HEPARINUNFRC  --   --    < > 0.26*   < > 0.20*  --   --  0.23* 0.13*  CREATININE 1.04  --   --  1.18  --   --  1.15  --   --   --    < > = values in this interval not displayed.    Estimated Creatinine Clearance: 76.1 mL/min (by C-G formula based on SCr of 1.15 mg/dL).   Assessment: 9 YOM presenting with left leg pain, hx of PAD s/p bypass + iliac stent on DAPT PTA.  CT showing bypass graft occlusion.  Chronic anemia.  Patient is s/p lysis. Now completed alteplase. Hemoglobin trended down slightly to 10.7, plt also trended down from 300s to 219 this morning. No overt bleeding noted. Heparin level below goal at 0.13.   Goal of Therapy:  Heparin level 0.3 - 0.7 units/mL Monitor platelets by anticoagulation protocol: Yes   Plan:  Increase heparin infusion to 2100 units/hr Recheck 6 hr heparin level Daily heparin level and CBC Monitor closely for s/sx of bleeding  68 PharmD., BCPS Clinical Pharmacist 01/09/2021 8:03 AM

## 2021-01-09 NOTE — TOC Initial Note (Signed)
Transition of Care [facility]) - Initial/Assessment Note    Patient Details  Name: Robert Sanchez MRN: [id number] Date of Birth: 11-20-1955  [facility] CM/SW Contact:    Gala Lewandowsky, RN Phone Number: 01/09/2021, 11:22 AM  Clinical Narrative:   Case Manager received a consult regarding medication assistance and to get the patient established at a local clinic for primary care services. Patient is without insurance at this time. 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 He lives alone in a single family home and states it has been difficult to afford his medications. Patient was previously on Eliquis and received a 30 day free supply. Patient will not be able to use the 30 day free card again. Case Manager did discuss Coumadin with the patient and that he would need INR checks- patient states he does not want to go and do INR checks. Patient states that he and the physician will be discussing options. Case Manager relayed this information to the pharmacist on the unit. Pharmacy will check to see if Xarelto may be an option and he get the 30 day free card and possible paperwork be initiated here in the hospital for patient assistance for the year upon qualification.  Pt is agreeable to a hospital follow up with the [facility] and [facility]. Appointment placed on the AVS.                  Expected Discharge Plan: Home/Self Care Barriers to Discharge: Continued Medical Work up   Patient Goals and CMS Choice Patient states their goals for this hospitalization and ongoing recovery are:: to return home   Choice offered to / list presented to : NA  Expected Discharge Plan and Services Expected Discharge Plan: Home/Self Care In-house Referral: NA Discharge Planning Services: CM Consult Post Acute Care Choice: NA Living arrangements for the past 2 months: Single Family Home                   DME Agency: NA       HH Arranged: NA     Prior  Living Arrangements/Services Living arrangements for the past 2 months: Single Family Home Lives with:: Self Patient language and need for interpreter reviewed:: Yes Do you feel safe going back to the place where you live?: Yes      Need for Family Participation in Patient Care: No (Comment) Care giver support system in place?: No (comment)   Criminal Activity/Legal Involvement Pertinent to Current Situation/Hospitalization: No - Comment as needed  Activities of Daily Living Home Assistive Devices/Equipment: None ADL Screening (condition at time of admission) Patient's cognitive ability adequate to safely complete daily activities?: Yes Is the patient deaf or have difficulty hearing?: Yes Does the patient have difficulty seeing, even when wearing glasses/contacts?: No Does the patient have difficulty concentrating, remembering, or making decisions?: No Patient able to express need for assistance with ADLs?: No Does the patient have difficulty dressing or bathing?: No Independently performs ADLs?: Yes (appropriate for developmental age) Does the patient have difficulty walking or climbing stairs?: No Weakness of Legs: Left Weakness of Arms/Hands: None  Permission Sought/Granted Permission sought to share information with : Case Manager    Emotional Assessment Appearance:: Appears stated age Attitude/Demeanor/Rapport: Engaged Affect (typically observed): Appropriate Orientation: : Oriented to Situation,Oriented to  Time,Oriented to Place,Oriented to Self Alcohol / Substance Use: Not Applicable Psych Involvement: No (comment)  Admission diagnosis:  Occlusion of left femorotibial bypass graft, initial encounter (  HCC) [T82.898A] Pain of left lower extremity [M79.605] Critical limb ischemia with history of revascularization of same extremity (HCC) [Z61.096, Z98.890] Patient Active Problem List   Diagnosis Date Noted  . Critical limb ischemia with history of revascularization of  same extremity (HCC) 01/06/2021  . Acute venous embolism and thrombosis of deep vessels of proximal lower extremity (HCC) 07/16/2020  . Cellulitis of foot 01/15/2020  . Critical ischemia of extremity with history of revascularization of same extremity (HCC) 01/15/2020  . Leukocytosis 01/15/2020  . CKD (chronic kidney disease), stage III (HCC) 01/15/2020  . Acute on chronic systolic CHF (congestive heart failure) (HCC) 05/04/2019  . Mild renal insufficiency 05/04/2019  . LFT elevation 05/04/2019  . PAD (peripheral artery disease) (HCC) 08/21/2018  . PVD (peripheral vascular disease) (HCC) 07/18/2018  . 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 CAD (coronary artery disease) 06/05/2018  . Pulmonary edema 06/03/2018  . DKA, type 2 (HCC) 06/03/2018  . Diabetic acidosis without coma (HCC)   . Sebaceous cyst 08/23/2016  . Acute pain of right knee 08/23/2016  . Hidradenitis 08/23/2016  . Right hip pain 08/14/2014  . Diabetes mellitus type II, non insulin dependent (HCC) 11/27/2013  . Arthritis 11/27/2013  . Neuropathy in diabetes (HCC) 11/27/2013   PCP:  Janeece Agee, NP Pharmacy:   [facility] [address], [place] - [address] [place] [address] Phone: [phone] Fax: [phone]  [facility] [place], [place] - [address] [place] [address] Phone: [phone] Fax: [phone]  Readmission Risk Interventions Readmission Risk Prevention Plan 01/21/2020  Transportation Screening Complete  PCP or Specialist Appt within 5-7 Days Complete  Home Care Screening Complete  Medication Review (RN CM) Complete  Some recent data might be hidden

## 2021-01-09 NOTE — TOC Benefit Eligibility Note (Signed)
Patient Advocate Encounter  Insurance verification completed.    The patient is uninsured  Jeffrey Smith, CPhT Pharmacy Patient Advocate Specialist [place] [facility] Stewardship Team Direct Number: [phone]  Fax: [phone]        

## 2021-01-09 NOTE — Progress Notes (Signed)
Transitions of Care Pharmacist Note  Robert Sanchez is a 65 y.o. male that has an occluded left lower extremity and will be prescribed Xarelto (rivaroxaban) at discharge.   Discharge Medications Plan: The patient wants to have their discharge medications filled by the Transitions of Care pharmacy rather than their usual pharmacy.  The discharge orders pharmacy has been changed to the Transitions of Care pharmacy, and their 30-day free supply of this medication will be stored in Main pharmacy.   Application for [employer] Patient Assistance Program initiated in an effort to reduce the patient's out of pocket expense for Xarelto to $0. The patient portion of the application has been completed. Awaiting completion of provider portion by vascular surgery. Application was placed in patient's chart.   Once the application is completed, it will need to be faxed to [phone] [employer] patient assistance phone number for follow up is [phone]  Thank you,   Yvetta Coder, PharmD PGY1 Acute Care Pharmacy Resident January 09, 2021

## 2021-01-09 NOTE — Progress Notes (Addendum)
Progress Note    01/09/2021 7:07 AM 1 Day Post-Op  Subjective:  Says his left foot is sore but better  Afebrile HR 70's-80's NSR 110's-130's systolic 90% 1LO2NC  Gtts: none  Vitals:   01/09/21 0600 01/09/21 0603  BP: 119/65   Pulse: 79 78  Resp: 10 12  Temp:    SpO2: (!) 84% 95%    Physical Exam: Cardiac:  regular Lungs:  Non labored Incisions:  Right groin is soft  Extremities:  Brisk left PT/DP doppler signals; calf and anterior compartments are soft and non tender; left foot with swelling  CBC    Component Value Date/Time   WBC 9.6 01/08/2021 0719   RBC 4.30 01/08/2021 0719   HGB 11.9 (L) 01/08/2021 0719   HCT 38.1 (L) 01/08/2021 0719   PLT 235 01/08/2021 0719   MCV 88.6 01/08/2021 0719   MCH 27.7 01/08/2021 0719   MCHC 31.2 01/08/2021 0719   RDW 16.2 (H) 01/08/2021 0719   LYMPHSABS 1.5 01/15/2020 0750   MONOABS 1.2 (H) 01/15/2020 0750   EOSABS 0.2 01/15/2020 0750   BASOSABS 0.1 01/15/2020 0750    BMET    Component Value Date/Time   NA 136 01/08/2021 0638   NA 137 06/27/2020 1216   K 4.7 01/08/2021 0638   CL 107 01/08/2021 0638   CO2 20 (L) 01/08/2021 0638   GLUCOSE 159 (H) 01/08/2021 0638   BUN 17 01/08/2021 0638   BUN 17 06/27/2020 1216   CREATININE 1.15 01/08/2021 0638   CREATININE 0.81 08/14/2014 1126   CALCIUM 8.5 (L) 01/08/2021 0638   GFRNONAA >60 01/08/2021 0638   GFRNONAA >89 08/14/2014 1126   GFRAA 58 (L) 08/05/2020 0524   GFRAA >89 08/14/2014 1126    INR    Component Value Date/Time   INR 1.0 01/06/2021 1209     Intake/Output Summary (Last 24 hours) at 01/09/2021 0707 Last data filed at 01/09/2021 0400 Gross per 24 hour  Intake 2367.23 ml  Output 4000 ml  Net -1632.77 ml     Assessment:  65 y.o. male is s/p:  1.  Thrombolytics catheter check of the left lower extremity with left lower extremity arteriogram 2.  Angioplasty of distal bypass anastomosis onto the below-knee popliteal artery and TP trunk including  drug-coated balloon angioplasty (3 mm x 60 mm Sterling, 4 mm x 60 mm Sterling, and 4 mm x 60 mm Ranger)  1 Day Post-Op  Plan: -pt with brisk doppler signals left DP/PT; left calf and ant compartments soft and non tender -DVT prophylaxis:  Heparin gtt -most likely can transfer to 4 east -mobilize today   Doreatha Massed, PA-C [facility] [phone] 01/09/2021 7:07 AM  I have seen and evaluated the patient. I agree with the PA note as documented above.  Status post thrombolysis of occluded left leg bypass with excellent results.  I angioplastied the distal anastomosis and the BK pop/TP trunk yesterday with good result and he has two-vessel runoff in the PT peroneal.  The right groin was closed with a mynx and looks good.  He has a very brisk Doppler signal in the left foot at the PT.  Transfer out of the ICU to the floor.  Continue heparin.  He has a high risk bypass that needs to be discharged on anticoagulation.  Will consult transition of care pharmacy.  In the past has been unable to afford a DOAC and became medically noncompliant.  We will see if there are any alternatives available -  he has also done Coumadin in the past but it is hesitant to do that again.  Cephus Shelling, MD [facility] [place] Office: [phone]

## 2021-01-09 NOTE — Progress Notes (Signed)
ANTICOAGULATION CONSULT NOTE   Pharmacy Consult for heparin Indication: Occluded left lower extremity bypass graft  Allergies  Allergen Reactions  . Lisinopril Other (See Comments)    Syncope  . Codeine Rash    Patient Measurements: Height: 5\' 8"  (172.7 cm) Weight: 99.5 kg (219 lb 5.7 oz) IBW/kg (Calculated) : 68.4 Heparin Dosing Weight: 91kg  Vital Signs: Temp: 98.5 F (36.9 C) (03/11 1506) Temp Source: Oral (03/11 1506) BP: 121/59 (03/11 1700) Pulse Rate: 73 (03/11 1700)  Labs: Recent Labs    01/07/21 0028 01/07/21 0834 01/08/21 0435 01/08/21 03/10/21 01/08/21 0719 01/08/21 0824 01/09/21 0636 01/09/21 1614  HGB 11.9*   < > 11.6*  --  11.9*  --  10.7*  --   HCT 37.3*   < > 36.8*  --  38.1*  --  34.5*  --   PLT 353   < > 247  --  235  --  219  --   HEPARINUNFRC 0.26*   < > 0.20*  --   --  0.23* 0.13* 0.20*  CREATININE 1.18  --   --  1.15  --   --   --   --    < > = values in this interval not displayed.    Estimated Creatinine Clearance: 76.1 mL/min (by C-G formula based on SCr of 1.15 mg/dL).   Assessment: 17 YOM presenting with left leg pain, hx of PAD s/p bypass + iliac stent on DAPT PTA.  CT showing bypass graft occlusion.  Chronic anemia. Patient is s/p lysis. Now completed alteplase. Hemoglobin trended down slightly to 10.7, plt also trended down from 300s to 219 this morning.  PM Heparin level is up to 0.20 but remains subtherapeutic for goal on 2100 units/hr. No issues or bleeding reported.   Goal of Therapy:  Heparin level 0.3 - 0.7 units/mL Monitor platelets by anticoagulation protocol: Yes   Plan:  Increase heparin infusion to 2300 units/hr Recheck 6 hr heparin level Daily heparin level and CBC Monitor closely for s/sx of bleeding  2101, PharmD, BCPS, BCCCP Clinical Pharmacist Please refer to [facility] for [facility] Pharmacy numbers 01/09/2021 6:08 PM

## 2021-01-09 NOTE — Progress Notes (Signed)
Offered to assist patient with his bath.  Patient refused.  He states that he bathed himself earlier.  He states that his sheets are soiled.  I asked if he would sit in the chair and I would change the sheets.  Patient declined the offer.  Patient states that if he does not transfer to the other floor by this afternoon he will allow me to assist him with a bath and changing of his sheets.

## 2021-01-09 NOTE — Progress Notes (Signed)
Patient Advocate Encounter  Application for [employer] Patient Assistance Program initiated in an effort to reduce the patient's out of pocket expense for Xarelto to $0. The patient portion of the application has been completed. Awaiting completion of provider portion by vascular surgery.   Once the application is completed, it will need to be faxed to [phone] [employer] patient assistance phone number for follow up is [phone]   Sharen Hones, PharmD, BCPS Heart Failure Stewardship Pharmacist Phone [phone]  Please check [email or web address] for unit-specific pharmacist phone numbers

## 2021-01-10 LAB — GLUCOSE, CAPILLARY
Glucose-Capillary: 162 mg/dL — ABNORMAL HIGH (ref 70–99)
Glucose-Capillary: 184 mg/dL — ABNORMAL HIGH (ref 70–99)
Glucose-Capillary: 190 mg/dL — ABNORMAL HIGH (ref 70–99)
Glucose-Capillary: 283 mg/dL — ABNORMAL HIGH (ref 70–99)

## 2021-01-10 LAB — CBC
HCT: 34.4 % — ABNORMAL LOW (ref 39.0–52.0)
Hemoglobin: 11 g/dL — ABNORMAL LOW (ref 13.0–17.0)
MCH: 27.8 pg (ref 26.0–34.0)
MCHC: 32 g/dL (ref 30.0–36.0)
MCV: 87.1 fL (ref 80.0–100.0)
Platelets: 195 10*3/uL (ref 150–400)
RBC: 3.95 MIL/uL — ABNORMAL LOW (ref 4.22–5.81)
RDW: 15.9 % — ABNORMAL HIGH (ref 11.5–15.5)
WBC: 10.1 10*3/uL (ref 4.0–10.5)
nRBC: 0 % (ref 0.0–0.2)

## 2021-01-10 LAB — BASIC METABOLIC PANEL
Anion gap: 8 (ref 5–15)
BUN: 15 mg/dL (ref 8–23)
CO2: 21 mmol/L — ABNORMAL LOW (ref 22–32)
Calcium: 8.4 mg/dL — ABNORMAL LOW (ref 8.9–10.3)
Chloride: 104 mmol/L (ref 98–111)
Creatinine, Ser: 1.28 mg/dL — ABNORMAL HIGH (ref 0.61–1.24)
GFR, Estimated: >60 mL/min (ref >60)
Glucose, Bld: 190 mg/dL — ABNORMAL HIGH (ref 70–99)
Potassium: 4 mmol/L (ref 3.5–5.1)
Sodium: 133 mmol/L — ABNORMAL LOW (ref 135–145)

## 2021-01-10 LAB — HEPARIN LEVEL (UNFRACTIONATED)
Heparin Unfractionated: 0.36 IU/mL (ref 0.30–0.70)
Heparin Unfractionated: 0.31 IU/mL (ref 0.30–0.70)

## 2021-01-10 NOTE — Progress Notes (Signed)
ANTICOAGULATION CONSULT NOTE   Pharmacy Consult for heparin Indication: Occluded left lower extremity bypass graft  Allergies  Allergen Reactions  . Lisinopril Other (See Comments)    Syncope  . Codeine Rash    Patient Measurements: Height: 5\' 8"  (172.7 cm) Weight: 99.5 kg (219 lb 5.7 oz) IBW/kg (Calculated) : 68.4 Heparin Dosing Weight: 91kg  Vital Signs: Temp: 98.2 F (36.8 C) (03/11 2300) Temp Source: Oral (03/11 2300) BP: 120/57 (03/12 0000) Pulse Rate: 74 (03/12 0000)  Labs: Recent Labs    01/08/21 03/10/21 01/08/21 0719 01/08/21 0824 01/09/21 0636 01/09/21 1614 01/10/21 0044  HGB  --  11.9*  --  10.7*  --  11.0*  HCT  --  38.1*  --  34.5*  --  34.4*  PLT  --  235  --  219  --  195  HEPARINUNFRC  --   --    < > 0.13* 0.20* 0.36  CREATININE 1.15  --   --   --   --  1.28*   < > = values in this interval not displayed.    Estimated Creatinine Clearance: 68.4 mL/min (A) (by C-G formula based on SCr of 1.28 mg/dL (H)).   Assessment: 75 YOM presenting with left leg pain, hx of PAD s/p bypass + iliac stent on DAPT PTA.  CT showing bypass graft occlusion.  Chronic anemia. Patient is s/p lysis. Now completed alteplase. Hemoglobin trended down slightly to 10.7, plt also trended down from 300s to 219 this morning.  PM Heparin level is up to 0.20 but remains subtherapeutic for goal on 2100 units/hr. No issues or bleeding reported.   3/12 AM update:  Heparin level therapeutic x 1 after rate increase  Goal of Therapy:  Heparin level 0.3 - 0.7 units/mL Monitor platelets by anticoagulation protocol: Yes   Plan:  Cont heparin at 2300 units/hr 1000 heparin level Daily heparin level and CBC Monitor closely for s/sx of bleeding  5/12, PharmD, BCPS Clinical Pharmacist Phone: [phone]

## 2021-01-10 NOTE — Progress Notes (Signed)
Called to give report.  Nurse unavailable.  I left my number for her to return call when available.

## 2021-01-10 NOTE — Progress Notes (Signed)
ANTICOAGULATION CONSULT NOTE   Pharmacy Consult for heparin Indication: Occluded left lower extremity bypass graft  Allergies  Allergen Reactions  . Lisinopril Other (See Comments)    Syncope  . Codeine Rash    Patient Measurements: Height: 5\' 8"  (172.7 cm) Weight: 99.5 kg (219 lb 5.7 oz) IBW/kg (Calculated) : 68.4 Heparin Dosing Weight: 91kg  Vital Signs: Temp: 97.5 F (36.4 C) (03/12 0700) Temp Source: Oral (03/12 0400) BP: 136/67 (03/12 1000) Pulse Rate: 69 (03/12 1000)  Labs: Recent Labs    01/08/21 03/10/21 01/08/21 0719 01/08/21 0824 01/09/21 0636 01/09/21 1614 01/10/21 0044 01/10/21 1006  HGB  --  11.9*  --  10.7*  --  11.0*  --   HCT  --  38.1*  --  34.5*  --  34.4*  --   PLT  --  235  --  219  --  195  --   HEPARINUNFRC  --   --    < > 0.13* 0.20* 0.36 0.31  CREATININE 1.15  --   --   --   --  1.28*  --    < > = values in this interval not displayed.    Estimated Creatinine Clearance: 68.4 mL/min (A) (by C-G formula based on SCr of 1.28 mg/dL (H)).   Assessment: 85 YOM presenting with left leg pain, hx of PAD s/p bypass + iliac stent on DAPT PTA.  CT showing bypass graft occlusion.  Chronic anemia. Patient is s/p lysis on 3/10. Now completed alteplase.   Confirmatory heparin level on low end of therapeutic at 0.31, on heparin 2300 units/hr. Hgb 11 stable, pltc wnl 195. No overt bleeding or infusion issues per discussion with nursing. Will increase drip rate to maintain therapeutic level.  Goal of Therapy:  Heparin level 0.3 - 0.7 units/mL Monitor platelets by anticoagulation protocol: Yes   Plan:  Increase IV heparin to 2400 units/hr Next HL with AM labs Daily HL, CBC, s/sx bleeding  5/10, PharmD, BCPS PGY2 Cardiology Pharmacy Resident Phone: [phone] 01/10/2021  11:05 AM  Please check [email or web address] for unit-specific pharmacy phone numbers.

## 2021-01-10 NOTE — Progress Notes (Signed)
Pt arrived to rm 5 on heparin drip 3ml/hr from 2H. Initiated tele. CHG wipe provided. VSS. Call bell within reach.   Lawson Radar, RN

## 2021-01-10 NOTE — Progress Notes (Signed)
    Subjective  - POD #2, s/p lysis  Says foot hurts but is much better   Physical Exam:  Good DP doppler signal       Assessment/Plan:  POD #2  Transfer to [facility] Working on meds for d/c with TOC Need to ambulate  Robert Sanchez 01/10/2021 8:23 AM --  Vitals:   01/10/21 0700 01/10/21 0800  BP:  (!) 140/55  Pulse:  73  Resp:  15  Temp: (!) 97.5 F (36.4 C)   SpO2:  100%    Intake/Output Summary (Last 24 hours) at 01/10/2021 0823 Last data filed at 01/10/2021 0600 Gross per 24 hour  Intake 943.75 ml  Output 1500 ml  Net -556.25 ml     Laboratory CBC    Component Value Date/Time   WBC 10.1 01/10/2021 0044   HGB 11.0 (L) 01/10/2021 0044   HCT 34.4 (L) 01/10/2021 0044   PLT 195 01/10/2021 0044    BMET    Component Value Date/Time   NA 133 (L) 01/10/2021 0044   NA 137 06/27/2020 1216   K 4.0 01/10/2021 0044   CL 104 01/10/2021 0044   CO2 21 (L) 01/10/2021 0044   GLUCOSE 190 (H) 01/10/2021 0044   BUN 15 01/10/2021 0044   BUN 17 06/27/2020 1216   CREATININE 1.28 (H) 01/10/2021 0044   CREATININE 0.81 08/14/2014 1126   CALCIUM 8.4 (L) 01/10/2021 0044   GFRNONAA >60 01/10/2021 0044   GFRNONAA >89 08/14/2014 1126   GFRAA 58 (L) 08/05/2020 0524   GFRAA >89 08/14/2014 1126    COAG Lab Results  Component Value Date   INR 1.0 01/06/2021   INR 1.1 08/07/2020   INR 1.2 08/04/2020   No results found for: PTT  Antibiotics Anti-infectives (From admission, onward)   None       V. Charlena Cross, M.D., [facility] [place] Office: [phone] Pager:  [phone]

## 2021-01-11 LAB — HEPARIN LEVEL (UNFRACTIONATED): Heparin Unfractionated: 0.42 IU/mL (ref 0.30–0.70)

## 2021-01-11 LAB — CBC
HCT: 32.8 % — ABNORMAL LOW (ref 39.0–52.0)
Hemoglobin: 10.8 g/dL — ABNORMAL LOW (ref 13.0–17.0)
MCH: 28.2 pg (ref 26.0–34.0)
MCHC: 32.9 g/dL (ref 30.0–36.0)
MCV: 85.6 fL (ref 80.0–100.0)
Platelets: 236 10*3/uL (ref 150–400)
RBC: 3.83 MIL/uL — ABNORMAL LOW (ref 4.22–5.81)
RDW: 16 % — ABNORMAL HIGH (ref 11.5–15.5)
WBC: 8.6 10*3/uL (ref 4.0–10.5)
nRBC: 0 % (ref 0.0–0.2)

## 2021-01-11 LAB — GLUCOSE, CAPILLARY
Glucose-Capillary: 187 mg/dL — ABNORMAL HIGH (ref 70–99)
Glucose-Capillary: 201 mg/dL — ABNORMAL HIGH (ref 70–99)
Glucose-Capillary: 233 mg/dL — ABNORMAL HIGH (ref 70–99)
Glucose-Capillary: 274 mg/dL — ABNORMAL HIGH (ref 70–99)
Glucose-Capillary: 410 mg/dL — ABNORMAL HIGH (ref 70–99)

## 2021-01-11 MED ORDER — POLYETHYLENE GLYCOL 3350 17 G PO PACK
17.0000 g | PACK | Freq: Two times a day (BID) | ORAL | Status: DC
  Administered 2021-01-11: 17 g via ORAL
  Filled 2021-01-11 (×2): qty 1

## 2021-01-11 MED ORDER — BISACODYL 5 MG PO TBEC: 5.0000 mg | DELAYED_RELEASE_TABLET | Freq: Every day | ORAL | Status: DC | PRN

## 2021-01-11 NOTE — Progress Notes (Signed)
ANTICOAGULATION CONSULT NOTE   Pharmacy Consult for heparin Indication: Occluded left lower extremity bypass graft  Allergies  Allergen Reactions  . Lisinopril Other (See Comments)    Syncope  . Codeine Rash    Patient Measurements: Height: 5\' 9"  (175.3 cm) Weight: 100.7 kg (222 lb) (stated per pt) IBW/kg (Calculated) : 70.7 Heparin Dosing Weight: 91kg  Vital Signs: Temp: 98.2 F (36.8 C) (03/13 0800) Temp Source: Oral (03/13 0800) BP: 125/96 (03/13 0800) Pulse Rate: 71 (03/13 0800)  Labs: Recent Labs    01/09/21 0636 01/09/21 1614 01/10/21 0044 01/10/21 1006 01/11/21 0152  HGB 10.7*  --  11.0*  --  10.8*  HCT 34.5*  --  34.4*  --  32.8*  PLT 219  --  195  --  236  HEPARINUNFRC 0.13*   < > 0.36 0.31 0.42  CREATININE  --   --  1.28*  --   --    < > = values in this interval not displayed.    Estimated Creatinine Clearance: 70 mL/min (A) (by C-G formula based on SCr of 1.28 mg/dL (H)).   Assessment: 38 YOM presenting with left leg pain, hx of PAD s/p bypass + iliac stent on DAPT PTA.  CT showing bypass graft occlusion.  Chronic anemia. Patient is s/p lysis on 3/10. Now completed alteplase.   Therapeutic heparin level, on heparin 2400 units/hr. Hgb 11 stable, pltc wnl ~200. No overt bleeding or infusion issues.   Goal of Therapy:  Heparin level 0.3 - 0.7 units/mL Monitor platelets by anticoagulation protocol: Yes   Plan:  Continue IV heparin to 2400 units/hr Next HL with AM labs Daily HL, CBC, s/sx bleeding  5/10, PharmD PGY1 Pharmacy Resident 01/11/2021 8:54 AM  Please check [email or web address] for unit-specific pharmacy phone numbers.

## 2021-01-11 NOTE — Progress Notes (Addendum)
   VASCULAR SURGERY ASSESSMENT & PLAN:   3 Days Post-procedure: Thrombolysis LLE with DCB angioplasty of distal bypass anastomosis on the BK popliteal artery and TP trunk. Remains on heparin infusion with ongoing efforts to procure rivaroxaban for outpatient therapy as opposed to warfarin. (paperwork has been completed)   LLE well perfused. VSS. Afebrile. H and H stable. Platelets 236k. Continue heparin infusion.   SUBJECTIVE:   Left foot is "sore"  PHYSICAL EXAM:   Vitals:   01/10/21 1607 01/10/21 2243 01/11/21 0006 01/11/21 0547  BP: 131/66 103/64 122/65 115/61  Pulse: 74 71 67 70  Resp: 19 17 13 18   Temp: 97.6 F (36.4 C) 97.7 F (36.5 C) 98.6 F (37 C) 98.5 F (36.9 C)  TempSrc: Oral Oral Oral Oral  SpO2: 96% 96% 100% 96%  Weight:      Height:       General appearance: Awake, alert in no apparent distress Cardiac: Heart rate and rhythm are regular Respirations: Nonlabored Right groin: soft, without bleeding or hematoma Extremities: Both feet are warm with intact sensation and motor function. Left foot with moderate edema; no skin breakdown. Brisk left dorsalis pedis, posterior tibial artery Doppler signals  LABS:   Lab Results  Component Value Date   WBC 8.6 01/11/2021   HGB 10.8 (L) 01/11/2021   HCT 32.8 (L) 01/11/2021   MCV 85.6 01/11/2021   PLT 236 01/11/2021   Lab Results  Component Value Date   CREATININE 1.28 (H) 01/10/2021   Lab Results  Component Value Date   INR 1.0 01/06/2021   CBG (last 3)  Recent Labs    01/10/21 1610 01/10/21 2332 01/11/21 0705  GLUCAP 283* 184* 187*    PROBLEM LIST:    Active Problems:   Critical limb ischemia with history of revascularization of same extremity (HCC)   CURRENT MEDS:   . atorvastatin  80 mg Oral QHS  . carvedilol  6.25 mg Oral BID WC  . Chlorhexidine Gluconate Cloth  6 each Topical Daily  . docusate sodium  100 mg Oral BID  . furosemide  40 mg Oral Daily  . insulin aspart  0-15 Units  Subcutaneous TID WC  . insulin aspart  0-5 Units Subcutaneous QHS  . insulin glargine  14 Units Subcutaneous Daily  . pantoprazole  40 mg Oral Daily  . sodium chloride flush  3 mL Intravenous Q12H  . sodium chloride flush  3 mL Intravenous Q12H    01/13/21, PA-C Office: [phone] 01/11/2021   I agree with the above.  I have seen and evaluated the patient.  His foot continues to improve.  He does have mild edema.  Awaiting medication assistance.  D/C once medication plan resolved  01/13/2021

## 2021-01-11 NOTE — Progress Notes (Signed)
Mobility Specialist: Progress Note   01/11/21 1714  Mobility  Activity Ambulated in hall  Level of Assistance Minimal assist, patient does 75% or more  Assistive Device Front wheel walker  Distance Ambulated (ft) 470 ft  Mobility Response Tolerated well  Mobility performed by Mobility specialist  Bed Position Chair  $Mobility charge 1 Mobility   Pre-Mobility: 71 HR, 98% SpO2 Post-Mobility: 71 HR, 126/59 BP, 100% SpO2  Pt c/o 8/10 pain during ambulation in LLE. Pt otherwise asx. Pt to chair after walk per request.   Cristal Deer Linzey Ramser Mobility Specialist Mobility Specialist Phone: [phone]

## 2021-01-12 ENCOUNTER — Other Ambulatory Visit (HOSPITAL_COMMUNITY): Payer: Self-pay | Admitting: Physician Assistant

## 2021-01-12 LAB — CBC
HCT: 35.1 % — ABNORMAL LOW (ref 39.0–52.0)
Hemoglobin: 11.1 g/dL — ABNORMAL LOW (ref 13.0–17.0)
MCH: 27.7 pg (ref 26.0–34.0)
MCHC: 31.6 g/dL (ref 30.0–36.0)
MCV: 87.5 fL (ref 80.0–100.0)
Platelets: 232 10*3/uL (ref 150–400)
RBC: 4.01 MIL/uL — ABNORMAL LOW (ref 4.22–5.81)
RDW: 16 % — ABNORMAL HIGH (ref 11.5–15.5)
WBC: 8.7 10*3/uL (ref 4.0–10.5)
nRBC: 0 % (ref 0.0–0.2)

## 2021-01-12 LAB — GLUCOSE, CAPILLARY
Glucose-Capillary: 205 mg/dL — ABNORMAL HIGH (ref 70–99)
Glucose-Capillary: 327 mg/dL — ABNORMAL HIGH (ref 70–99)

## 2021-01-12 LAB — HEPARIN LEVEL (UNFRACTIONATED): Heparin Unfractionated: 0.6 IU/mL (ref 0.30–0.70)

## 2021-01-12 MED ORDER — RIVAROXABAN (XARELTO) VTE STARTER PACK (15 & 20 MG): ORAL_TABLET | ORAL | 0 refills | Status: DC

## 2021-01-12 MED ORDER — RIVAROXABAN 15 MG PO TABS
15.0000 mg | ORAL_TABLET | Freq: Two times a day (BID) | ORAL | Status: DC
  Administered 2021-01-12: 15 mg via ORAL
  Filled 2021-01-12: qty 1

## 2021-01-12 MED ORDER — OXYCODONE-ACETAMINOPHEN 5-325 MG PO TABS: 1.0000 | ORAL_TABLET | ORAL | 0 refills | Status: DC | PRN

## 2021-01-12 MED ORDER — RIVAROXABAN 20 MG PO TABS: 20.0000 mg | ORAL_TABLET | Freq: Every day | ORAL | Status: DC

## 2021-01-12 NOTE — TOC Transition Note (Signed)
Transition of Care [facility]) - CM/SW Discharge Note   Patient Details  Name: Robert Sanchez MRN: [id number] Date of Birth: Nov 16, 1955  [facility]) CM/SW Contact:  Leone Haven, RN Phone Number: 01/12/2021, 10:05 AM   Clinical Narrative:    Patient is for dc today, he has his xarelto medication that was filled by [facility] ,  NCM gave him the xarelto patient ast application for him to take to his apt at the [facility] clinic. NCM informed him to make sure he goes to this apt, so that he can get assistance with the xarelto.  He states he understands.  He will go to [facility] outpatient pharmacy to get his narcotic medication.  He has no other needs.   Final next level of care: Home/Self Care Barriers to Discharge: No Barriers Identified   Patient Goals and CMS Choice Patient states their goals for this hospitalization and ongoing recovery are:: home   Choice offered to / list presented to : NA  Discharge Placement                       Discharge Plan and Services In-house Referral: NA Discharge Planning Services: CM Consult Post Acute Care Choice: NA            DME Agency: NA       HH Arranged: NA          Social Determinants of Health (SDOH) Interventions     Readmission Risk Interventions Readmission Risk Prevention Plan 01/21/2020  Transportation Screening Complete  PCP or Specialist Appt within 5-7 Days Complete  Home Care Screening Complete  Medication Review (RN CM) Complete  Some recent data might be hidden

## 2021-01-12 NOTE — Progress Notes (Signed)
Discharge instructions provided, IV and tele discontinued.  Medications provided per transitions of care pharmacy

## 2021-01-12 NOTE — TOC Progression Note (Signed)
Transition of Care [facility]) - Progression Note    Patient Details  Name: Robert Sanchez MRN: [id number] Date of Birth: [birth year]  Transition of Care [facility]) CM/SW Contact  Leone Haven, RN Phone Number: 01/12/2021, 9:52 AM  Clinical Narrative:    NCM spoke with patient, informed him about his follow up at the [facility] clinic, it is important that he goes to this apt, they will be able to help with the patient ast with the xarelto medication.   Expected Discharge Plan: Home/Self Care Barriers to Discharge: Continued Medical Work up  Expected Discharge Plan and Services Expected Discharge Plan: Home/Self Care In-house Referral: NA Discharge Planning Services: CM Consult Post Acute Care Choice: NA Living arrangements for the past 2 months: Single Family Home Expected Discharge Date: 01/12/21                 DME Agency: NA       HH Arranged: NA           Social Determinants of Health (SDOH) Interventions    Readmission Risk Interventions Readmission Risk Prevention Plan 01/21/2020  Transportation Screening Complete  PCP or Specialist Appt within 5-7 Days Complete  Home Care Screening Complete  Medication Review (RN CM) Complete  Some recent data might be hidden

## 2021-01-12 NOTE — Progress Notes (Addendum)
VASCULAR SURGERY ASSESSMENT & PLAN:   4 Days Post-procedure: Thrombolysis LLE with DCB angioplasty of distal bypass anastomosis on the BK popliteal artery and TP trunk. Remains on heparin infusion with ongoing efforts to procure rivaroxaban for outpatient therapy as opposed to warfarin. (paperwork has been completed)  LLE well perfused. VSS. Afebrile. H and H stable. Platelets 232k.   Discussed case with Dr. Chestine Spore and pharmacist this morning.  Application for Xarelto assistance faxed.  We will load with Xarelto and discontinue his heparin infusion.  We will make referral to community health and wellness clinic for follow-up with management/insulin recommendations per diabetic coordinator.  Disposition: Discharge home.  Follow-up with Dr. Chestine Spore in 1 month with arterial duplex of right lower extremity.   SUBJECTIVE:   No complaints this morning.  Ambulating well.  Tolerating diet.  PHYSICAL EXAM:   Vitals:   01/11/21 1601 01/11/21 2033 01/12/21 0004 01/12/21 0634  BP: 121/65 110/67 104/63 122/64  Pulse: 65  69 69  Resp: 17  15 15   Temp: (!) 97.4 F (36.3 C) (!) 97.4 F (36.3 C) 97.7 F (36.5 C) 98.2 F (36.8 C)  TempSrc: Oral Oral Oral Oral  SpO2: 100%  90% 94%  Weight:      Height:       General appearance: Awake, alert in no apparent distress Cardiac: Heart rate and rhythm are regular Respirations: Nonlabored Right groin: soft, without bleeding or hematoma Extremities: Both feet are warm with intact sensation and motor function. Left foot with moderate edema; no skin breakdown. Brisk left dorsalis pedis, posterior tibial artery Doppler signals  LABS:   Lab Results  Component Value Date   WBC 8.7 01/12/2021   HGB 11.1 (L) 01/12/2021   HCT 35.1 (L) 01/12/2021   MCV 87.5 01/12/2021   PLT 232 01/12/2021   Lab Results  Component Value Date   CREATININE 1.28 (H) 01/10/2021   Lab Results  Component Value Date   INR 1.0 01/06/2021   CBG (last 3)  Recent Labs     01/11/21 1602 01/11/21 1604 01/12/21 0636  GLUCAP 410* 274* 205*    PROBLEM LIST:    Active Problems:   Critical limb ischemia with history of revascularization of same extremity (HCC)   CURRENT MEDS:   . atorvastatin  80 mg Oral QHS  . carvedilol  6.25 mg Oral BID WC  . Chlorhexidine Gluconate Cloth  6 each Topical Daily  . docusate sodium  100 mg Oral BID  . furosemide  40 mg Oral Daily  . insulin aspart  0-15 Units Subcutaneous TID WC  . insulin aspart  0-5 Units Subcutaneous QHS  . insulin glargine  14 Units Subcutaneous Daily  . pantoprazole  40 mg Oral Daily  . polyethylene glycol  17 g Oral BID  . sodium chloride flush  3 mL Intravenous Q12H  . sodium chloride flush  3 mL Intravenous Q12H    01/14/21, PA-C Office: [phone] 01/12/2021   I have seen and evaluated the patient. I agree with the PA note as documented above. S/P left leg thrombolysis.  Brisk PT signal and bypass patent.  Plan to discharge on DOAC and consulted [facility] pharmacy Friday.  Will fax form for assistance for Xarelto this am and plan to d/c on Xarelto with one month free after discussion with pharmacy this am.  Consult to community health and wellness for insulin per diabetes coordinator.  F/U one month with me in office with left leg arterial duplex.  If unable to get Xarelto would d/c on lovenox injections and Coumadin clinic.  Cephus Shelling, MD [facility] [place] Office: [phone]

## 2021-01-12 NOTE — Discharge Instructions (Signed)

## 2021-01-13 ENCOUNTER — Telehealth: Payer: Self-pay | Admitting: *Deleted

## 2021-01-13 NOTE — Telephone Encounter (Signed)
Transition Care Management Follow-up Telephone Call  Date of discharge and from where: 01/12/2021 - [facility]  How have you been since you were released from the hospital? "I am okay"  Any questions or concerns? No  Items Reviewed:  Did the pt receive and understand the discharge instructions provided? Yes   Medications obtained and verified? Yes   Other? No   Any new allergies since your discharge? No   Dietary orders reviewed? Yes  Do you have support at home? Yes   Home Care and Equipment/Supplies: Were home health services ordered? not applicable If so, what is the name of the agency? N/A  Has the agency set up a time to come to the patient's home? not applicable Were any new equipment or medical supplies ordered?  No What is the name of the medical supply agency? N/A Were you able to get the supplies/equipment? not applicable Do you have any questions related to the use of the equipment or supplies? No  Functional Questionnaire: (I = Independent and D = Dependent) ADLs: I  Bathing/Dressing- I  Meal Prep- I  Eating- I  Maintaining continence- I  Transferring/Ambulation- I  Managing Meds- I  Follow up appointments reviewed:   PCP Hospital f/u appt confirmed? Yes  Scheduled to see Dr. Laural Benes on 02/17/2021 @ 1430.  Specialist Hospital f/u appt confirmed? No    Are transportation arrangements needed? No   If their condition worsens, is the pt aware to call PCP or go to the Emergency Dept.? Yes  Was the patient provided with contact information for the PCP's office or ED? Yes  Was to pt encouraged to call back with questions or concerns? Yes

## 2021-01-16 NOTE — Discharge Summary (Signed)
Bypass Discharge Summary Patient ID: Robert Sanchez [id number] 64 y.o. [birth year]  Admit date: 01/06/2021  Discharge date and time: 01/12/2021 12:24 PM   Admitting Physician: Chuck Hint, MD   Discharge Physician: Sherald Hess, MD  Admission Diagnoses: Occlusion of left femorotibial bypass graft, initial encounter [facility]) [T82.898A] Pain of left lower extremity [M79.605] Critical limb ischemia with history of revascularization of same extremity (HCC) [I70.229, Z98.890]  Discharge Diagnoses: Occlusion of left femorotibial bypass graft, initial encounter [facility]) [T82.898A] Pain of left lower extremity [M79.605] Critical limb ischemia with history of revascularization of same extremity (HCC) [I70.229, Z98.890]  Admission Condition: fair  Discharged Condition: good  Indication for Admission: Occlusion of left femorotibial bypass graft, initial encounter (HCC) [T82.898A] Pain of left lower extremity [M79.605] Critical limb ischemia with history of revascularization of same extremity (HCC) [I70.229, Z98.890]  Hospital Course: The patient presented with acute left lower extremity pain and thrombosis of occluded bypass.  He was taken to the Cath Lab and underwent placement of thrombolytic's catheter.  He was returned to the lab the following day for recheck and angiogram.  Angioplasty of the distal bypass anastomosis onto the below-knee popliteal artery and TP trunk including drug coated balloon angioplasty was performed.  He tolerated the procedure well.  Was maintained on a heparin infusion.  Over the next several days attention was turned to mobility and pain control.  He also had diabetic coordinator consultation.  His vital signs remained stable and he was afebrile.  His lower extremity pain improved.  Dr. Chestine Spore discussed with the patient that he would need anticoagulation with either Coumadin or Xarelto.  The patient declined Coumadin, but could not afford Xarelto.   Ultimately, he was given a 30-day supply of Xarelto and application made for payment assistance.  On the day of discharge, his bypass was patent and his pain was controlled.  He was ambulating without difficulty.  He was discharged home in satisfactory condition. Consults: None  Treatments: thrombolysis, anticoagulation   Disposition: Discharge disposition: 01-Home or Self Care       - For [facility] Registry use ---  Post-op:  Wound infection: No  Graft infection: No  Transfusion: No  If yes, 0 units given New Arrhythmia: No Patency judged by: [ x] Dopper only, [ ]  Palpable graft pulse, [ ]  Palpable distal pulse, [ ]  ABI inc. > 0.15, [ ]  Duplex Discharge ABI: R , L  Discharge TBI: R , L  D/C Ambulatory Status: Ambulatory  Complications: MI: [ ]  No, [ ]  Troponin only, [ ]  EKG or Clinical CHF: No Resp failure: [ ]  none, [ ]  Pneumonia, [ ]  Ventilator Chg in renal function: [ ]  none, [ ]  Inc. Cr > 0.5, [ ]  Temp. Dialysis, [ ]  Permanent dialysis Stroke: [ ]  None, [ ]  Minor, [ ]  Major Return to OR: No  Reason for return to OR: [ ]  Bleeding, [ ]  Infection, [ ]  Thrombosis, [ ]  Revision  Discharge medications: Statin use:  Yes ASA use:  No  for medical reason not indicated Plavix use:  Yes Beta blocker use: Yes Coumadin use: No  for medical reason on rivaroxaban    Patient Instructions:  Allergies as of 01/12/2021      Reactions   Lisinopril Other (See Comments)   Syncope   Codeine Rash      Medication List    TAKE these medications   aspirin 81 MG EC tablet Take 1 tablet (81 mg total) by mouth daily at 6 (  six) AM. Swallow whole.   atorvastatin 80 MG tablet Commonly known as: LIPITOR Take 1 tablet (80 mg total) by mouth at bedtime.   carvedilol 6.25 MG tablet Commonly known as: COREG Take 1 tablet (6.25 mg total) by mouth 2 (two) times daily with a meal.   clopidogrel 75 MG tablet Commonly known as: PLAVIX Take 1 tablet (75 mg total) by mouth daily.   furosemide  40 MG tablet Commonly known as: LASIX Take 1 tablet (40 mg total) by mouth daily.   glipiZIDE 10 MG tablet Commonly known as: GLUCOTROL Take 1 tablet (10 mg total) by mouth 2 (two) times daily before a meal.   metFORMIN 1000 MG tablet Commonly known as: GLUCOPHAGE Take 1 tablet (1,000 mg total) by mouth 2 (two) times daily with a meal.   multivitamin tablet Take 1 tablet by mouth daily.   oxyCODONE-acetaminophen 5-325 MG tablet Commonly known as: PERCOCET/ROXICET Take 1 tablet by mouth every 4 (four) hours as needed for moderate pain. What changed:   when to take this  reasons to take this   potassium chloride SA 20 MEQ tablet Commonly known as: KLOR-CON Take 1 tablet (20 mEq total) by mouth daily.   rivaroxaban 20 MG Tabs tablet Commonly known as: XARELTO Take 1 tablet (20 mg total) by mouth daily with supper.   Rivaroxaban Stater Pack (15 mg and 20 mg) Commonly known as: XARELTO STARTER PACK Follow package directions: Take one 15mg  tablet by mouth twice a day. On day 22, switch to one 20mg  tablet once a day. Take with food.      Activity: activity as tolerated Diet: diabetic diet Wound Care: none needed  Follow-up with Dr. in 3 weeks.  Signed: Setzer 01/16/2021 1:14 PM

## 2021-01-29 ENCOUNTER — Other Ambulatory Visit: Payer: Self-pay

## 2021-01-29 DIAGNOSIS — Z9889 Other specified postprocedural states: Secondary | ICD-10-CM

## 2021-02-10 ENCOUNTER — Encounter: Payer: Self-pay | Admitting: Vascular Surgery

## 2021-02-10 ENCOUNTER — Ambulatory Visit (HOSPITAL_COMMUNITY)
Admission: RE | Admit: 2021-02-10 | Discharge: 2021-02-10 | Disposition: A | Discharge status: Home / Self Care | Payer: Self-pay | Source: Ambulatory Visit | Attending: Vascular Surgery | Admitting: Vascular Surgery

## 2021-02-10 ENCOUNTER — Other Ambulatory Visit: Payer: Self-pay

## 2021-02-10 ENCOUNTER — Ambulatory Visit (INDEPENDENT_AMBULATORY_CARE_PROVIDER_SITE_OTHER): Payer: Self-pay | Admitting: Vascular Surgery

## 2021-02-10 ENCOUNTER — Ambulatory Visit (INDEPENDENT_AMBULATORY_CARE_PROVIDER_SITE_OTHER)
Admission: RE | Admit: 2021-02-10 | Discharge: 2021-02-10 | Disposition: A | Discharge status: Home / Self Care | Payer: Self-pay | Source: Ambulatory Visit | Attending: Vascular Surgery | Admitting: Vascular Surgery

## 2021-02-10 VITALS — BP 122/73 | HR 90 | Temp 98.3°F | Ht 68.0 in | Wt 219.6 lb

## 2021-02-10 DIAGNOSIS — Z9889 Other specified postprocedural states: Secondary | ICD-10-CM | POA: Insufficient documentation

## 2021-02-10 DIAGNOSIS — I70229 Atherosclerosis of native arteries of extremities with rest pain, unspecified extremity: Secondary | ICD-10-CM | POA: Insufficient documentation

## 2021-02-10 DIAGNOSIS — I739 Peripheral vascular disease, unspecified: Secondary | ICD-10-CM

## 2021-02-10 MED ORDER — RIVAROXABAN 20 MG PO TABS: ORAL_TABLET | Freq: Every day | ORAL | 6 refills | Status: DC

## 2021-02-10 NOTE — Progress Notes (Signed)
Patient name: Robert Sanchez MRN: [id number] DOB: 03-Oct-1956 Sex: male  REASON FOR VISIT: Hospital follow-up after recent thrombolysis for occluded left leg bypass  HPI: Robert Sanchez is a 65 y.o. male with history of diabetes, coronary artery disease, peripheral vascular disease, tobacco abuse presents for hospital follow-up.  He has a somewhat complex history in the left leg. He initially underwent left common femoral to PT bypass with reversed saphenous vein in 2019 for CLI with rest pain.Ultimately he presented to the hospitalin March 2021with an occluded bypass and underwent thrombolysis and subsequent percutaneous mechanical thrombectomy with angioplasty of the bypass and PT on 01/16/20- 01/17/2020. Ultimately his bypass was salvaged and he was discharged on Eliquis. During this event he did develop necrotic left great toe that we have been watching. He then presented again and his bypass had occluded a second time in September2021and underwent second attempt thrombolysis that was unsuccessful. Ultimately during an attempted thrombolysis we took him back he developed thrombus in the common femoral and iliac and required thrombectomy and ultimately we performed a left common femoral to PT composite bypass using the distal vein on the PT since this appeared to be healthy. This then thrombosed immediately after hospital discharge.  Then on 08/04/2020 he underwent thrombectomy of the left common femoral to PT composite bypass.  We performed a left retrograde left external iliac angioplasty of an external iliac artery stent.  We then put a bovine patch on the below-knee popliteal artery TP trunk and revised the bypass to the below-knee popliteal artery TP trunk with PTFE.Marland Kitchen  He also got a partial left toe amp at this time.  He then presented with an occluded bypass and underwent thrombolysis on 01/07/2021 with angioplasty of the distal anastomosis onto the BK pop TP trunk on 01/08/2021.  He  remains on aspirin and Xarelto.  States his foot is now doing well.  He wants to go back to work.  No new concerns.  Past Medical History:  Diagnosis Date  . Acute pulmonary edema (HCC) 06/05/2018  . Arthritis   . COPD (chronic obstructive pulmonary disease) (HCC)    " beginning stages " per pt  . Coronary artery disease   . Diabetes mellitus without complication (HCC)    type 2  . Diabetic neuropathy (HCC)   . Diabetic neuropathy (HCC)   . Emphysema/COPD (HCC)    " beginning stages"  . GERD (gastroesophageal reflux disease)   . HLD (hyperlipidemia)   . HOH (hard of hearing)    no hearing aids  . Myocardial infarction (HCC)   . Peripheral vascular disease [facility])     Past Surgical History:  Procedure Laterality Date  . ABDOMINAL AORTOGRAM W/LOWER EXTREMITY N/A 06/21/2018   Procedure: ABDOMINAL AORTOGRAM W/LOWER EXTREMITY;  Surgeon: Cephus Shelling, MD;  Location: [facility];  Service: Cardiovascular;  Laterality: N/A;  . ABDOMINAL AORTOGRAM W/LOWER EXTREMITY Left 01/16/2020   Procedure: ABDOMINAL AORTOGRAM W/LOWER EXTREMITY;  Surgeon: Cephus Shelling, MD;  Location: [facility];  Service: Cardiovascular;  Laterality: Left;  . ABDOMINAL AORTOGRAM W/LOWER EXTREMITY N/A 07/09/2020   Procedure: ABDOMINAL AORTOGRAM W/LOWER EXTREMITY;  Surgeon: Cephus Shelling, MD;  Location: [facility];  Service: Cardiovascular;  Laterality: N/A;  TPA infusion  . AMPUTATION Left 08/04/2020   Procedure: LEFT PARTIAL GREAT TOE AMPUTATION;  Surgeon: Cephus Shelling, MD;  Location: [facility] OR;  Service: Vascular;  Laterality: Left;  . APPLICATION OF WOUND VAC Left 08/04/2020  Procedure: LEFT GROIN DEBRIDEMENT WITH APPLICATION OF WOUND VAC;  Surgeon: Cephus Shelling, MD;  Location: [facility] OR;  Service: Vascular;  Laterality: Left;  . CORONARY ARTERY BYPASS GRAFT N/A 06/12/2018   Procedure: CORONARY ARTERY BYPASS GRAFTING (CABG) x 3 WITH ENDOSCOPIC HARVESTING OF RIGHT GREATER  SAPHENOUS VEIN. LIMA TO LAD. SVG TO PD. SVG TO DIAGONAL.;  Surgeon: Kerin Perna, MD;  Location: [facility] OR;  Service: Open Heart Surgery;  Laterality: N/A;  . FEMORAL-POPLITEAL BYPASS GRAFT Left 07/10/2020   Procedure: REDO EXPOSURE OF LEFT COMMON FEMORAL ARTERY LEFT COMMON FEMORAL TO POSTERIOR TIBIAL COMPOSITE BYPASS GRAFT;  Surgeon: Cephus Shelling, MD;  Location: [facility] OR;  Service: Vascular;  Laterality: Left;  . FEMORAL-TIBIAL BYPASS GRAFT Left 08/21/2018   Procedure: BYPASS GRAFT LEFT FEMORAL TO POSTERIOR TIBIAL ARTERY USING LEFT REVERSED GREAT SAPHENOUS VEIN;  Surgeon: Cephus Shelling, MD;  Location: [facility] OR;  Service: Vascular;  Laterality: Left;  . FEMORAL-TIBIAL BYPASS GRAFT Left 08/04/2020   Procedure: REVISION FEMORAL-DISTAL BYPASS WITH BIOLOGICAL BOVINE PATCH;  Surgeon: Cephus Shelling, MD;  Location: [facility] OR;  Service: Vascular;  Laterality: Left;  . KNEE ARTHROSCOPY    . LOWER EXTREMITY ANGIOGRAM Left 08/04/2020   Procedure: LEFT ILIAC ANGIOGRAM, ANGIOPLASTY OF LEFT ILIAC STENT WITH DRUG COATED BALLOON;  Surgeon: Cephus Shelling, MD;  Location: [facility] OR;  Service: Vascular;  Laterality: Left;  . LOWER EXTREMITY ANGIOGRAPHY Left 01/17/2020   Procedure: Lower Extremity Angiography;  Surgeon: Cephus Shelling, MD;  Location: [facility] INVASIVE CV LAB;  Service: Cardiovascular;  Laterality: Left;  . LOWER EXTREMITY ANGIOGRAPHY N/A 01/07/2021   Procedure: LOWER EXTREMITY ANGIOGRAPHY;  Surgeon: Leonie Douglas, MD;  Location: [facility];  Service: Cardiovascular;  Laterality: N/A;  . PERIPHERAL VASCULAR BALLOON ANGIOPLASTY Left 01/17/2020   Procedure: PERIPHERAL VASCULAR BALLOON ANGIOPLASTY;  Surgeon: Cephus Shelling, MD;  Location: [facility];  Service: Cardiovascular;  Laterality: Left;  pt  . PERIPHERAL VASCULAR INTERVENTION Left 06/21/2018   Procedure: PERIPHERAL VASCULAR INTERVENTION;  Surgeon: Cephus Shelling, MD;  Location: [facility] INVASIVE CV LAB;  Service:  Cardiovascular;  Laterality: Left;  . PERIPHERAL VASCULAR INTERVENTION Left 01/08/2021   Procedure: PERIPHERAL VASCULAR INTERVENTION;  Surgeon: Cephus Shelling, MD;  Location: [facility] INVASIVE CV LAB;  Service: Cardiovascular;  Laterality: Left;  . PERIPHERAL VASCULAR THROMBECTOMY Left 01/16/2020   Procedure: PERIPHERAL VASCULAR THROMBECTOMY;  Surgeon: Cephus Shelling, MD;  Location: [facility];  Service: Cardiovascular;  Laterality: Left;  Lytic Catheter Placement left fem-pop bypass  . PERIPHERAL VASCULAR THROMBECTOMY Left 01/17/2020   Procedure: PERIPHERAL VASCULAR THROMBECTOMY;  Surgeon: Cephus Shelling, MD;  Location: [facility];  Service: Cardiovascular;  Laterality: Left;  fem-pt bypass  . PERIPHERAL VASCULAR THROMBECTOMY Left 07/10/2020   Procedure: lysis recheck;  Surgeon: Cephus Shelling, MD;  Location: [facility] INVASIVE CV LAB;  Service: Cardiovascular;  Laterality: Left;  . PULMONARY THROMBECTOMY N/A 01/08/2021   Procedure: LYSIS RECHECK;  Surgeon: Cephus Shelling, MD;  Location: [facility] INVASIVE CV LAB;  Service: Cardiovascular;  Laterality: N/A;  . RIGHT/LEFT HEART CATH AND CORONARY ANGIOGRAPHY N/A 06/05/2018   Procedure: RIGHT/LEFT HEART CATH AND CORONARY ANGIOGRAPHY;  Surgeon: Orpah Cobb, MD;  Location: [facility];  Service: Cardiovascular;  Laterality: N/A;  . SPINE SURGERY    . TEE WITHOUT CARDIOVERSION N/A 06/12/2018   Procedure: TRANSESOPHAGEAL ECHOCARDIOGRAM (TEE);  Surgeon: Donata Clay, Theron Arista, MD;  Location[facility] OR;  Service: Open Heart Surgery;  Laterality: N/A;  . teeth extractions    . THROMBECTOMY FEMORAL ARTERY Left 08/04/2020   Procedure: THROMBECTOMY OF LEFT FEMORAL TP COMPOSTITE BYPASS;  Surgeon: Cephus Shelling, MD;  Location: [facility] OR;  Service: Vascular;  Laterality: Left;  . THROMBECTOMY ILIAC ARTERY Left 07/10/2020   Procedure: THROMBECTOMY OF LEFT EXTERNAL ILIAC AND LEFT COMMON FEMORAL AND LEFT POSTERIOR TIBIAL ARTERIES;  Surgeon: Cephus Shelling, MD;  Location: [facility] OR;  Service: Vascular;  Laterality: Left;  Marland Kitchen VEIN HARVEST Left 08/21/2018   Procedure: VEIN HARVEST LEFT GREAT SAPHENOUS VEIN;  Surgeon: Cephus Shelling, MD;  Location: [facility] OR;  Service: Vascular;  Laterality: Left;    Family History  Problem Relation Age of Onset  . Diabetes Mother   . Lung disease Mother   . Cancer Father   . Heart disease Father     SOCIAL HISTORY: Social History   Tobacco Use  . Smoking status: Former Smoker    Packs/day: 1.00    Years: 40.00    Pack years: 40.00    Types: Cigarettes    Quit date: 06/02/2018    Years since quitting: 2.6  . Smokeless tobacco: Never Used  Substance Use Topics  . Alcohol use: Yes    Comment: occasional    Allergies  Allergen Reactions  . Lisinopril Other (See Comments)    Syncope  . Codeine Rash    Current Outpatient Medications  Medication Sig Dispense Refill  . aspirin EC 81 MG EC tablet Take 1 tablet (81 mg total) by mouth daily at 6 (six) AM. Swallow whole. 30 tablet 11  . atorvastatin (LIPITOR) 80 MG tablet TAKE 1 TABLET (80 MG TOTAL) BY MOUTH AT BEDTIME. 90 tablet 4  . carvedilol (COREG) 6.25 MG tablet Take 1 tablet (6.25 mg total) by mouth 2 (two) times daily with a meal. 60 tablet 5  . clopidogrel (PLAVIX) 75 MG tablet TAKE 1 TABLET (75 MG TOTAL) BY MOUTH DAILY. 30 tablet 2  . furosemide (LASIX) 40 MG tablet Take 1 tablet (40 mg total) by mouth daily. 30 tablet 5  . glipiZIDE (GLUCOTROL) 10 MG tablet Take 1 tablet (10 mg total) by mouth 2 (two) times daily before a meal. 180 tablet 3  . metFORMIN (GLUCOPHAGE) 1000 MG tablet Take 1 tablet (1,000 mg total) by mouth 2 (two) times daily with a meal. 180 tablet 3  . Multiple Vitamin (MULTIVITAMIN) tablet Take 1 tablet by mouth daily.    Marland Kitchen oxyCODONE-acetaminophen (PERCOCET/ROXICET) 5-325 MG tablet TAKE 1 TABLET BY MOUTH EVERY 4 HOURS AS NEEDED FOR MODERATE PAIN (Patient not taking: Reported on 02/10/2021) 20 tablet 0  . potassium  chloride SA (KLOR-CON) 20 MEQ tablet Take 1 tablet (20 mEq total) by mouth daily. 30 tablet 5  . rivaroxaban (XARELTO) 20 MG TABS tablet TAKE 1 TABLET (20 MG TOTAL) BY MOUTH DAILY WITH SUPPER. 30 tablet 6  . RIVAROXABAN (XARELTO) VTE STARTER PACK (15 & 20 MG) FOLLOW PACKAGE DIRECTIONS: TAKE ONE 15MG  TABLET BY MOUTH TWICE A DAY. ON DAY 22, SWITCH TO ONE 20MG  TABLET ONCE A DAY. TAKE WITH FOOD. 51 each 0   No current facility-administered medications for this visit.    REVIEW OF SYSTEMS:  [X]  denotes positive finding, [ ]  denotes negative finding Cardiac  Comments:  Chest pain or chest pressure:    Shortness of breath upon exertion:    Short of breath when lying flat:    Irregular heart rhythm:        Vascular  Pain in calf, thigh, or hip brought on by ambulation:    Pain in feet at night that wakes you up from your sleep:     Blood clot in your veins:    Leg swelling:         Pulmonary    Oxygen at home:    Productive cough:     Wheezing:         Neurologic    Sudden weakness in arms or legs:     Sudden numbness in arms or legs:     Sudden onset of difficulty speaking or slurred speech:    Temporary loss of vision in one eye:     Problems with dizziness:         Gastrointestinal    Blood in stool:     Vomited blood:         Genitourinary    Burning when urinating:     Blood in urine:        Psychiatric    Major depression:         Hematologic    Bleeding problems:    Problems with blood clotting too easily:        Skin    Rashes or ulcers:        Constitutional    Fever or chills:      PHYSICAL EXAM: Vitals:   02/10/21 1445  BP: 122/73  Pulse: 90  Temp: 98.3 F (36.8 C)  TempSrc: Skin  SpO2: 97%  Weight: 219 lb 9.6 oz (99.6 kg)  Height: 5\' 8"  (1.727 m)    GENERAL: The patient is a well-nourished male, in no acute distress. The vital signs are documented above. CARDIAC: There is a regular rate and rhythm.  VASCULAR:  Possible left femoral  pulse Left toe amp healed Brisk left PT signal and also dorsalis pedis signal      DATA:   Lower extremity arterial duplex shows a patent bypass ABI 0.97 biphasic in left foot  Assessment/Plan:  65 year old male with complex history that most recently underwent thrombectomy of his left common femoral to PT composite bypass with retrograde iliac angioplasty and then revision of the distal bypass onto the below-knee popliteal artery/TP trunk with a bovine pericardial patch angioplasty.  This then required thrombolysis on 01/07/2021 and 01/08/2021 with angioplasty of the distal anastomosis.  Bypass is patent on duplex today.  I recommend he continue aspirin and Xarelto.  I will see him in 6 months.  We will give him a note to go back to work.  Will get surveillance duplex imaging on next follow-up.   Cephus Shellinghristopher J. Lariyah Shetterly, MD [facility] Office: [phone]

## 2021-02-16 ENCOUNTER — Other Ambulatory Visit: Payer: Self-pay

## 2021-02-16 DIAGNOSIS — I739 Peripheral vascular disease, unspecified: Secondary | ICD-10-CM

## 2021-02-17 ENCOUNTER — Telehealth: Payer: Self-pay | Admitting: Internal Medicine

## 2021-02-19 ENCOUNTER — Encounter: Payer: Self-pay | Admitting: Family Medicine

## 2021-02-19 ENCOUNTER — Other Ambulatory Visit: Payer: Self-pay

## 2021-02-19 ENCOUNTER — Ambulatory Visit: Payer: Self-pay | Attending: Internal Medicine | Admitting: Family Medicine

## 2021-02-19 DIAGNOSIS — I739 Peripheral vascular disease, unspecified: Secondary | ICD-10-CM

## 2021-02-19 DIAGNOSIS — E1159 Type 2 diabetes mellitus with other circulatory complications: Secondary | ICD-10-CM

## 2021-02-19 DIAGNOSIS — I25810 Atherosclerosis of coronary artery bypass graft(s) without angina pectoris: Secondary | ICD-10-CM

## 2021-02-19 DIAGNOSIS — E1165 Type 2 diabetes mellitus with hyperglycemia: Secondary | ICD-10-CM

## 2021-02-19 DIAGNOSIS — I152 Hypertension secondary to endocrine disorders: Secondary | ICD-10-CM

## 2021-02-19 LAB — GLUCOSE, POCT (MANUAL RESULT ENTRY): POC Glucose: 288 mg/dl — AB (ref 70–99)

## 2021-02-19 MED ORDER — POTASSIUM CHLORIDE CRYS ER 20 MEQ PO TBCR
20.0000 meq | EXTENDED_RELEASE_TABLET | Freq: Every day | ORAL | 5 refills | Status: DC
  Filled 2021-02-19: qty 30, 30d supply, fill #0

## 2021-02-19 MED ORDER — METFORMIN HCL 1000 MG PO TABS
1000.0000 mg | ORAL_TABLET | Freq: Two times a day (BID) | ORAL | 5 refills | Status: DC
  Filled 2021-02-19: qty 60, 30d supply, fill #0

## 2021-02-19 MED ORDER — CLOPIDOGREL BISULFATE 75 MG PO TABS
ORAL_TABLET | Freq: Every day | ORAL | 5 refills | Status: DC
  Filled 2021-02-19: qty 30, 30d supply, fill #0

## 2021-02-19 MED ORDER — FUROSEMIDE 40 MG PO TABS
40.0000 mg | ORAL_TABLET | Freq: Every day | ORAL | 5 refills | Status: DC
  Filled 2021-02-19: qty 30, 30d supply, fill #0

## 2021-02-19 MED ORDER — GLIPIZIDE 10 MG PO TABS
10.0000 mg | ORAL_TABLET | Freq: Two times a day (BID) | ORAL | 5 refills | Status: DC
  Filled 2021-02-19: qty 60, 30d supply, fill #0

## 2021-02-19 MED ORDER — INSULIN PEN NEEDLE 31G X 5 MM MISC
1.0000 | Freq: Every day | 5 refills | Status: DC
  Filled 2021-02-19: qty 100, 100d supply, fill #0

## 2021-02-19 MED ORDER — ATORVASTATIN CALCIUM 80 MG PO TABS
ORAL_TABLET | Freq: Every day | ORAL | 5 refills | Status: DC
  Filled 2021-02-19: qty 30, 30d supply, fill #0

## 2021-02-19 MED ORDER — CARVEDILOL 6.25 MG PO TABS
6.2500 mg | ORAL_TABLET | Freq: Two times a day (BID) | ORAL | 5 refills | Status: DC
  Filled 2021-02-19: qty 60, 30d supply, fill #0

## 2021-02-19 MED ORDER — LANTUS SOLOSTAR 100 UNIT/ML ~~LOC~~ SOPN
10.0000 [IU] | PEN_INJECTOR | Freq: Every day | SUBCUTANEOUS | 6 refills | Status: DC
  Filled 2021-02-19: qty 3, 30d supply, fill #0

## 2021-02-19 NOTE — Progress Notes (Signed)
Subjective:  Patient ID: Robert Sanchez, male    DOB: Dec 02, 1955  Age: 65 y.o. MRN: [id number]  CC: Hospitalization Follow-up   HPI Robert Sanchez is a 65 year old male with a history of Type 2 DM (A1c 12.7) , CAD s/p CABG x3, PVD status post thrombectomy of left common femoral to PT composite bypass, with retrograde iliac angioplasty and revision of distal bypass to below the knee popliteal artery/TP trunk with a bovine pericardial patch angioplasty, subsequent thrombosis and angioplasty of distal anastomosis, partial L big toe amputation in 07/2020. Seen by vascular surgery on 02/10/2021 and notes reveal bypasses patent on duplex with plans to continue Xarelto and aspirin and follow-up in 6 months.  Fasting sugars are about 200 and he endorses compliance with his glipizide and metformin.  Does not check his blood sugars regularly at home.  Denies presence of paresthesia, blurry vision or hypoglycemia.  He is out of his antihypertensive hence his elevated blood pressure. He is on a program for Xarelto and receives it from [facility] free for 1 year. He does have financial constraints which affects his ability to obtain his medications and compliance. Denies presence of chest pains, palpitations, claudication pain.  Past Medical History:  Diagnosis Date  . Acute pulmonary edema (HCC) 06/05/2018  . Arthritis   . COPD (chronic obstructive pulmonary disease) (HCC)    " beginning stages " per pt  . Coronary artery disease   . Diabetes mellitus without complication (HCC)    type 2  . Diabetic neuropathy (HCC)   . Diabetic neuropathy (HCC)   . Emphysema/COPD (HCC)    " beginning stages"  . GERD (gastroesophageal reflux disease)   . HLD (hyperlipidemia)   . HOH (hard of hearing)    no hearing aids  . Myocardial infarction (HCC)   . Peripheral vascular disease [facility])     Past Surgical History:  Procedure Laterality Date  . ABDOMINAL AORTOGRAM W/LOWER EXTREMITY N/A 06/21/2018    Procedure: ABDOMINAL AORTOGRAM W/LOWER EXTREMITY;  Surgeon: Cephus Shelling, MD;  [facility];  Service: Cardiovascular;  Laterality: N/A;  . ABDOMINAL AORTOGRAM W/LOWER EXTREMITY Left 01/16/2020   Procedure: ABDOMINAL AORTOGRAM W/LOWER EXTREMITY;  Surgeon: Cephus Shelling, MD;  Location: [facility];  Service: Cardiovascular;  Laterality: Left;  . ABDOMINAL AORTOGRAM W/LOWER EXTREMITY N/A 07/09/2020   Procedure: ABDOMINAL AORTOGRAM W/LOWER EXTREMITY;  Surgeon: Cephus Shelling, MD;  [facility]: [facility];  Service: Cardiovascular;  Laterality: N/A;  TPA infusion  . AMPUTATION Left 08/04/2020   Procedure: LEFT PARTIAL GREAT TOE AMPUTATION;  Surgeon: Cephus Shelling, MD;  Location: [facility] OR;  Service: Vascular;  Laterality: Left;  . APPLICATION OF WOUND VAC Left 08/04/2020   Procedure: LEFT GROIN DEBRIDEMENT WITH APPLICATION OF WOUND VAC;  Surgeon: Cephus Shelling, MD;  Location: [facility] OR;  Service: Vascular;  Laterality: Left;  . CORONARY ARTERY BYPASS GRAFT N/A 06/12/2018   Procedure: CORONARY ARTERY BYPASS GRAFTING (CABG) x 3 WITH ENDOSCOPIC HARVESTING OF RIGHT GREATER SAPHENOUS VEIN. LIMA TO LAD. SVG TO PD. SVG TO DIAGONAL.;  Surgeon: Kerin Perna, MD;  Location: [facility] OR;  Service: Open Heart Surgery;  Laterality: N/A;  . FEMORAL-POPLITEAL BYPASS GRAFT Left 07/10/2020   Procedure: REDO EXPOSURE OF LEFT COMMON FEMORAL ARTERY LEFT COMMON FEMORAL TO POSTERIOR TIBIAL COMPOSITE BYPASS GRAFT;  Surgeon: Cephus Shelling, MD;  [facility];  Service: Vascular;  Laterality: Left;  . FEMORAL-TIBIAL BYPASS GRAFT Left 08/21/2018   Procedure:  BYPASS GRAFT LEFT FEMORAL TO POSTERIOR TIBIAL ARTERY USING LEFT REVERSED GREAT SAPHENOUS VEIN;  Surgeon: Cephus Shelling, MD;  Location: [facility] OR;  Service: Vascular;  Laterality: Left;  . FEMORAL-TIBIAL BYPASS GRAFT Left 08/04/2020   Procedure: REVISION FEMORAL-DISTAL BYPASS WITH BIOLOGICAL BOVINE PATCH;  Surgeon: Cephus Shelling, MD;  Location: [facility] OR;  Service: Vascular;  Laterality: Left;  . KNEE ARTHROSCOPY    . LOWER EXTREMITY ANGIOGRAM Left 08/04/2020   Procedure: LEFT ILIAC ANGIOGRAM, ANGIOPLASTY OF LEFT ILIAC STENT WITH DRUG COATED BALLOON;  Surgeon: Cephus Shelling, MD;  Location: [facility] OR;  Service: Vascular;  Laterality: Left;  . LOWER EXTREMITY ANGIOGRAPHY Left 01/17/2020   Procedure: Lower Extremity Angiography;  Surgeon: Cephus Shelling, MD;  Location: [facility] INVASIVE CV LAB;  Service: Cardiovascular;  Laterality: Left;  . LOWER EXTREMITY ANGIOGRAPHY N/A 01/07/2021   Procedure: LOWER EXTREMITY ANGIOGRAPHY;  Surgeon: Leonie Douglas, MD;  [facility];  Service: Cardiovascular;  Laterality: N/A;  . PERIPHERAL VASCULAR BALLOON ANGIOPLASTY Left 01/17/2020   Procedure: PERIPHERAL VASCULAR BALLOON ANGIOPLASTY;  Surgeon: Cephus Shelling, MD;  [facility];  Service: Cardiovascular;  Laterality: Left;  pt  . PERIPHERAL VASCULAR INTERVENTION Left 06/21/2018   Procedure: PERIPHERAL VASCULAR INTERVENTION;  Surgeon: Cephus Shelling, MD;  Location: [facility] INVASIVE CV LAB;  Service: Cardiovascular;  Laterality: Left;  . PERIPHERAL VASCULAR INTERVENTION Left 01/08/2021   Procedure: PERIPHERAL VASCULAR INTERVENTION;  Surgeon: Cephus Shelling, MD;  Location: [facility] INVASIVE CV LAB;  Service: Cardiovascular;  Laterality: Left;  . PERIPHERAL VASCULAR THROMBECTOMY Left 01/16/2020   Procedure: PERIPHERAL VASCULAR THROMBECTOMY;  Surgeon: Cephus Shelling, MD;  [facility];  Service: Cardiovascular;  Laterality: Left;  Lytic Catheter Placement left fem-pop bypass  . PERIPHERAL VASCULAR THROMBECTOMY Left 01/17/2020   Procedure: PERIPHERAL VASCULAR THROMBECTOMY;  Surgeon: Cephus Shelling, MD;  [facility];  Service: Cardiovascular;  Laterality: Left;  fem-pt bypass  . PERIPHERAL VASCULAR THROMBECTOMY Left 07/10/2020   Procedure: lysis recheck;   Surgeon: Cephus Shelling, MD;  Location: [facility] INVASIVE CV LAB;  Service: Cardiovascular;  Laterality: Left;  . PULMONARY THROMBECTOMY N/A 01/08/2021   Procedure: LYSIS RECHECK;  Surgeon: Cephus Shelling, MD;  Location: [facility] INVASIVE CV LAB;  Service: Cardiovascular;  Laterality: N/A;  . RIGHT/LEFT HEART CATH AND CORONARY ANGIOGRAPHY N/A 06/05/2018   Procedure: RIGHT/LEFT HEART CATH AND CORONARY ANGIOGRAPHY;  Surgeon: Orpah Cobb, MD;  [facility]: [facility];  Service: Cardiovascular;  Laterality: N/A;  . SPINE SURGERY    . TEE WITHOUT CARDIOVERSION N/A 06/12/2018   Procedure: TRANSESOPHAGEAL ECHOCARDIOGRAM (TEE);  Surgeon: Donata Clay, Theron Arista, MD;  Location: [facility] OR;  Service: Open Heart Surgery;  Laterality: N/A;  . teeth extractions    . THROMBECTOMY FEMORAL ARTERY Left 08/04/2020   Procedure: THROMBECTOMY OF LEFT FEMORAL TP COMPOSTITE BYPASS;  Surgeon: Cephus Shelling, MD;  Location: [facility] OR;  Service: Vascular;  Laterality: Left;  . THROMBECTOMY ILIAC ARTERY Left 07/10/2020   Procedure: THROMBECTOMY OF LEFT EXTERNAL ILIAC AND LEFT COMMON FEMORAL AND LEFT POSTERIOR TIBIAL ARTERIES;  Surgeon: Cephus Shelling, MD;  Location: [facility] OR;  Service: Vascular;  Laterality: Left;  Marland Kitchen VEIN HARVEST Left 08/21/2018   Procedure: VEIN HARVEST LEFT GREAT SAPHENOUS VEIN;  Surgeon: Cephus Shelling, MD;  Location: [facility] OR;  Service: Vascular;  Laterality: Left;    Family History  Problem Relation Age of Onset  . Diabetes Mother   .  Lung disease Mother   . Cancer Father   . Heart disease Father     Allergies  Allergen Reactions  . Lisinopril Other (See Comments)    Syncope  . Codeine Rash    Outpatient Medications Prior to Visit  Medication Sig Dispense Refill  . aspirin EC 81 MG EC tablet Take 1 tablet (81 mg total) by mouth daily at 6 (six) AM. Swallow whole. 30 tablet 11  . Multiple Vitamin (MULTIVITAMIN) tablet Take 1 tablet by mouth daily.    . rivaroxaban (XARELTO) 20 MG TABS  tablet TAKE 1 TABLET (20 MG TOTAL) BY MOUTH DAILY WITH SUPPER. 30 tablet 6  . atorvastatin (LIPITOR) 80 MG tablet TAKE 1 TABLET (80 MG TOTAL) BY MOUTH AT BEDTIME. 90 tablet 4  . carvedilol (COREG) 6.25 MG tablet Take 1 tablet (6.25 mg total) by mouth 2 (two) times daily with a meal. 60 tablet 5  . clopidogrel (PLAVIX) 75 MG tablet TAKE 1 TABLET (75 MG TOTAL) BY MOUTH DAILY. 30 tablet 2  . furosemide (LASIX) 40 MG tablet Take 1 tablet (40 mg total) by mouth daily. 30 tablet 5  . glipiZIDE (GLUCOTROL) 10 MG tablet Take 1 tablet (10 mg total) by mouth 2 (two) times daily before a meal. 180 tablet 3  . metFORMIN (GLUCOPHAGE) 1000 MG tablet Take 1 tablet (1,000 mg total) by mouth 2 (two) times daily with a meal. 180 tablet 3  . potassium chloride SA (KLOR-CON) 20 MEQ tablet Take 1 tablet (20 mEq total) by mouth daily. 30 tablet 5  . oxyCODONE-acetaminophen (PERCOCET/ROXICET) 5-325 MG tablet TAKE 1 TABLET BY MOUTH EVERY 4 HOURS AS NEEDED FOR MODERATE PAIN (Patient not taking: Reported on 02/19/2021) 20 tablet 0  . RIVAROXABAN (XARELTO) VTE STARTER PACK (15 & 20 MG) FOLLOW PACKAGE DIRECTIONS: TAKE ONE  TABLET BY MOUTH TWICE A DAY. ON DAY 22, SWITCH TO ONE  TABLET ONCE A DAY. TAKE WITH FOOD. (Patient not taking: Reported on 02/19/2021) 51 each 0   No facility-administered medications prior to visit.     ROS Review of Systems  Constitutional: Negative for activity change and appetite change.  HENT: Negative for sinus pressure and sore throat.   Eyes: Negative for visual disturbance.  Respiratory: Negative for cough, chest tightness and shortness of breath.   Cardiovascular: Negative for chest pain and leg swelling.  Gastrointestinal: Negative for abdominal distention, abdominal pain, constipation and diarrhea.  Endocrine: Negative.   Genitourinary: Negative for dysuria.  Musculoskeletal: Negative for joint swelling and myalgias.  Skin: Negative for rash.  Allergic/Immunologic: Negative.    Neurological: Negative for weakness, light-headedness and numbness.  Psychiatric/Behavioral: Negative for dysphoric mood and suicidal ideas.    Objective:  BP (!) 163/81   Pulse 86   Resp 18   Ht  (1.702 m)   Wt 222 lb 12.8 oz (101.1 kg)   SpO2 97%   BMI 34.90 kg/m   BP/Weight 02/19/2021 02/10/2021 01/12/2021  Systolic BP 163 122 124  Diastolic BP 81 73 63  Wt. (Lbs) 222.8 219.6 -  BMI 34.9 33.39 -      Physical Exam Constitutional:      Appearance: He is well-developed.  Neck:     Vascular: No JVD.  Cardiovascular:     Rate and Rhythm: Normal rate.     Heart sounds: Normal heart sounds. No murmur heard.   Pulmonary:     Effort: Pulmonary effort is normal.     Breath sounds: Normal breath sounds. No wheezing  or rales.  Chest:     Chest wall: No tenderness.  Abdominal:     General: Bowel sounds are normal. There is no distension.     Palpations: Abdomen is soft. There is no mass.     Tenderness: There is no abdominal tenderness.  Musculoskeletal:        General: Normal range of motion.     Right lower leg: No edema.     Left lower leg: No edema.  Neurological:     Mental Status: He is alert and oriented to person, place, and time.  Psychiatric:        Mood and Affect: Mood normal.     CMP Latest Ref Rng & Units 01/10/2021 01/08/2021 01/07/2021  Glucose 70 - 99 mg/dL 650(P) 546(F) 681(E)  BUN 8 - 23 mg/dL 15 17 15   Creatinine 0.61 - 1.24 mg/dL ) 7.51(Z 0.01  Sodium 135 - 145 mmol/L 133(L) 136 132(L)  Potassium 3.5 - 5.1 mmol/L 4.0 4.7 4.2  Chloride 98 - 111 mmol/L 104 107 102  CO2 22 - 32 mmol/L 21(L) 20(L) 20(L)  Calcium 8.9 - 10.3 mg/dL 7.49) 4.4(H) 6.7(R)  Total Protein 6.5 - 8.1 g/dL - - -  Total Bilirubin 0.3 - 1.2 mg/dL - - -  Alkaline Phos 38 - 126 U/L - - -  AST 15 - 41 U/L - - -  ALT 0 - 44 U/L - - -    Lipid Panel     Component Value Date/Time   CHOL 122 08/05/2020 0524   CHOL 151 06/27/2020 1216   TRIG 104 08/05/2020 0524   HDL  22 (L) 08/05/2020 0524   HDL 27 (L) 06/27/2020 1216   CHOLHDL 5.5 08/05/2020 0524   VLDL 21 08/05/2020 0524   LDLCALC 79 08/05/2020 0524   LDLCALC 100 (H) 06/27/2020 1216    CBC    Component Value Date/Time   WBC 8.7 01/12/2021 0131   RBC 4.01 (L) 01/12/2021 0131   HGB 11.1 (L) 01/12/2021 0131   HCT 35.1 (L) 01/12/2021 0131   PLT 232 01/12/2021 0131   MCV 87.5 01/12/2021 0131   MCH 27.7 01/12/2021 0131   MCHC 31.6 01/12/2021 0131   RDW 16.0 (H) 01/12/2021 0131   LYMPHSABS 1.5 01/15/2020 0750   MONOABS 1.2 (H) 01/15/2020 0750   EOSABS 0.2 01/15/2020 0750   BASOSABS 0.1 01/15/2020 0750    Lab Results  Component Value Date   HGBA1C 12.7 (H) 01/06/2021    Assessment & Plan:  1. Coronary artery disease involving coronary bypass graft of native heart without angina pectoris Asymptomatic Risk factor modification - atorvastatin (LIPITOR) 80 MG tablet; TAKE 1 TABLET (80 MG TOTAL) BY MOUTH AT BEDTIME.  Dispense: 90 tablet; Refill: 5 - carvedilol (COREG) 6.25 MG tablet; Take 1 tablet (6.25 mg total) by mouth 2 (two) times daily with a meal.  Dispense: 60 tablet; Refill: 5  2. PAD (peripheral artery disease) (HCC) Status post thrombectomy, angioplasty Risk factor modification Continue anticoagulation -it seems he is on aspirin, Plavix and Xarelto.  I will check with his vascular surgeon Dr. 03/08/2021 regarding this. - clopidogrel (PLAVIX) 75 MG tablet; TAKE 1 TABLET (75 MG TOTAL) BY MOUTH DAILY.  Dispense: 30 tablet; Refill: 5 - furosemide (LASIX) 40 MG tablet; Take 1 tablet (40 mg total) by mouth daily.  Dispense: 30 tablet; Refill: 5 - potassium chloride SA (KLOR-CON) 20 MEQ tablet; Take 1 tablet (20 mEq total) by mouth daily.  Dispense: 30 tablet; Refill: 5  3. Uncontrolled type 2 diabetes mellitus with hyperglycemia (HCC) Uncontrolled with A1c of 12.7; goal is less than 7.0 Initiate Lantus and clinical pharmacist called to provide education He will follow-up with the clinical  pharmacist in 2 weeks for up titration of Lantus dose Counseled on Diabetic diet, my plate method, [id number] minutes of moderate intensity exercise/week Blood sugar logs with fasting goals of 80-120 mg/dl, random of less than [id number] and in the event of sugars less than 60 mg/dl or greater than 536400 mg/dl encouraged to notify the clinic. Advised on the need for annual eye exams, annual foot exams, Pneumonia vaccine. - insulin glargine (LANTUS SOLOSTAR) 100 UNIT/ML Solostar Pen; Inject 10 Units into the skin daily.  Dispense: 15 mL; Refill: 6 - metFORMIN (GLUCOPHAGE) 1000 MG tablet; Take 1 tablet (1,000 mg total) by mouth 2 (two) times daily with a meal.  Dispense: 60 tablet; Refill: 5 - glipiZIDE (GLUCOTROL) 10 MG tablet; Take 1 tablet (10 mg total) by mouth 2 (two) times daily before a meal.  Dispense: 60 tablet; Refill: 5 - Insulin Pen Needle 31G X 5 MM MISC; 1 each at bedtime.  Dispense: 30 each; Refill: 5 - POCT glucose (manual entry)  4. Hypertension associated with diabetes (HCC) Uncontrolled due to the fact that he is yet to take his antihypertensives I have refilled them.    Meds ordered this encounter  Medications  . insulin glargine (LANTUS SOLOSTAR) 100 UNIT/ML Solostar Pen    Sig: Inject 10 Units into the skin daily.    Dispense:  15 mL    Refill:  6  . atorvastatin (LIPITOR) 80 MG tablet    Sig: TAKE 1 TABLET (80 MG TOTAL) BY MOUTH AT BEDTIME.    Dispense:  90 tablet    Refill:  5  . carvedilol (COREG) 6.25 MG tablet    Sig: Take 1 tablet (6.25 mg total) by mouth 2 (two) times daily with a meal.    Dispense:  60 tablet    Refill:  5  . clopidogrel (PLAVIX) 75 MG tablet    Sig: TAKE 1 TABLET (75 MG TOTAL) BY MOUTH DAILY.    Dispense:  30 tablet    Refill:  5  . furosemide (LASIX) 40 MG tablet    Sig: Take 1 tablet (40 mg total) by mouth daily.    Dispense:  30 tablet    Refill:  5  . metFORMIN (GLUCOPHAGE) 1000 MG tablet    Sig: Take 1 tablet (1,000 mg total) by mouth 2 (two)  times daily with a meal.    Dispense:  60 tablet    Refill:  5  . glipiZIDE (GLUCOTROL) 10 MG tablet    Sig: Take 1 tablet (10 mg total) by mouth 2 (two) times daily before a meal.    Dispense:  60 tablet    Refill:  5  . potassium chloride SA (KLOR-CON) 20 MEQ tablet    Sig: Take 1 tablet (20 mEq total) by mouth daily.    Dispense:  30 tablet    Refill:  5  . Insulin Pen Needle 31G X 5 MM MISC    Sig: 1 each at bedtime.    Dispense:  30 each    Refill:  5    Follow-up: Return in about 2 weeks (around 03/05/2021) for Blood sugar evaluation with Franky MachoLuke; 3 months with PCP.       Hoy RegisterEnobong Earnest Thalman, MD, FAAFP. [facility] [place] , [place] [phone]   02/19/2021,  9:41 AM

## 2021-02-19 NOTE — Patient Instructions (Signed)
Diabetes Mellitus and Nutrition, Adult When you have diabetes, or diabetes mellitus, it is very important to have healthy eating habits because your blood sugar (glucose) levels are greatly affected by what you eat and drink. Eating healthy foods in the right amounts, at about the same times every day, can help you:  Control your blood glucose.  Lower your risk of heart disease.  Improve your blood pressure.  Reach or maintain a healthy weight. What can affect my meal plan? Every person with diabetes is different, and each person has different needs for a meal plan. Your health care provider may recommend that you work with a dietitian to make a meal plan that is best for you. Your meal plan may vary depending on factors such as:  The calories you need.  The medicines you take.  Your weight.  Your blood glucose, blood pressure, and cholesterol levels.  Your activity level.  Other health conditions you have, such as heart or kidney disease. How do carbohydrates affect me? Carbohydrates, also called carbs, affect your blood glucose level more than any other type of food. Eating carbs naturally raises the amount of glucose in your blood. Carb counting is a method for keeping track of how many carbs you eat. Counting carbs is important to keep your blood glucose at a healthy level, especially if you use insulin or take certain oral diabetes medicines. It is important to know how many carbs you can safely have in each meal. This is different for every person. Your dietitian can help you calculate how many carbs you should have at each meal and for each snack. How does alcohol affect me? Alcohol can cause a sudden decrease in blood glucose (hypoglycemia), especially if you use insulin or take certain oral diabetes medicines. Hypoglycemia can be a life-threatening condition. Symptoms of hypoglycemia, such as sleepiness, dizziness, and confusion, are similar to symptoms of having too much  alcohol.  Do not drink alcohol if: ? Your health care provider tells you not to drink. ? You are pregnant, may be pregnant, or are planning to become pregnant.  If you drink alcohol: ? Do not drink on an empty stomach. ? Limit how much you use to:  0-1 drink a day for women.  0-2 drinks a day for men. ? Be aware of how much alcohol is in your drink. In the [place] one drink equals one 12 oz bottle of beer (355 mL), one 5 oz glass of wine (148 mL), or one 1 oz glass of hard liquor (44 mL). ? Keep yourself hydrated with water, diet soda, or unsweetened iced tea.  Keep in mind that regular soda, juice, and other mixers may contain a lot of sugar and must be counted as carbs. What are tips for following this plan? Reading food labels  Start by checking the serving size on the "Nutrition Facts" label of packaged foods and drinks. The amount of calories, carbs, fats, and other nutrients listed on the label is based on one serving of the item. Many items contain more than one serving per package.  Check the total grams (g) of carbs in one serving. You can calculate the number of servings of carbs in one serving by dividing the total carbs by 15. For example, if a food has 30 g of total carbs per serving, it would be equal to 2 servings of carbs.  Check the number of grams (g) of saturated fats and trans fats in one serving. Choose foods that have   a low amount or none of these fats.  Check the number of milligrams (mg) of salt (sodium) in one serving. Most people should limit total sodium intake to less than 2,300 mg per day.  Always check the nutrition information of foods labeled as "low-fat" or "nonfat." These foods may be higher in added sugar or refined carbs and should be avoided.  Talk to your dietitian to identify your daily goals for nutrients listed on the label. Shopping  Avoid buying canned, pre-made, or processed foods. These foods tend to be high in fat, sodium, and added  sugar.  Shop around the outside edge of the grocery store. This is where you will most often find fresh fruits and vegetables, bulk grains, fresh meats, and fresh dairy. Cooking  Use low-heat cooking methods, such as baking, instead of high-heat cooking methods like deep frying.  Cook using healthy oils, such as olive, canola, or sunflower oil.  Avoid cooking with butter, cream, or high-fat meats. Meal planning  Eat meals and snacks regularly, preferably at the same times every day. Avoid going long periods of time without eating.  Eat foods that are high in fiber, such as fresh fruits, vegetables, beans, and whole grains. Talk with your dietitian about how many servings of carbs you can eat at each meal.  Eat 4-6 oz (112-168 g) of lean protein each day, such as lean meat, chicken, fish, eggs, or tofu. One ounce (oz) of lean protein is equal to: ? 1 oz (28 g) of meat, chicken, or fish. ? 1 egg. ?  cup (62 g) of tofu.  Eat some foods each day that contain healthy fats, such as avocado, nuts, seeds, and fish.   What foods should I eat? Fruits Berries. Apples. Oranges. Peaches. Apricots. Plums. Grapes. Mango. Papaya. Pomegranate. Kiwi. Cherries. Vegetables Lettuce. Spinach. Leafy greens, including kale, chard, collard greens, and mustard greens. Beets. Cauliflower. Cabbage. Broccoli. Carrots. Green beans. Tomatoes. Peppers. Onions. Cucumbers. Brussels sprouts. Grains Whole grains, such as whole-wheat or whole-grain bread, crackers, tortillas, cereal, and pasta. Unsweetened oatmeal. Quinoa. Brown or wild rice. Meats and other proteins Seafood. Poultry without skin. Lean cuts of poultry and beef. Tofu. Nuts. Seeds. Dairy Low-fat or fat-free dairy products such as milk, yogurt, and cheese. The items listed above may not be a complete list of foods and beverages you can eat. Contact a dietitian for more information. What foods should I avoid? Fruits Fruits canned with  syrup. Vegetables Canned vegetables. Frozen vegetables with butter or cream sauce. Grains Refined white flour and flour products such as bread, pasta, snack foods, and cereals. Avoid all processed foods. Meats and other proteins Fatty cuts of meat. Poultry with skin. Breaded or fried meats. Processed meat. Avoid saturated fats. Dairy Full-fat yogurt, cheese, or milk. Beverages Sweetened drinks, such as soda or iced tea. The items listed above may not be a complete list of foods and beverages you should avoid. Contact a dietitian for more information. Questions to ask a health care provider  Do I need to meet with a diabetes educator?  Do I need to meet with a dietitian?  What number can I call if I have questions?  When are the best times to check my blood glucose? Where to find more information:  American Diabetes Association: diabetes.org  Academy of Nutrition and Dietetics: www.eatright.org  National Institute of Diabetes and Digestive and Kidney Diseases: www.niddk.nih.gov  Association of Diabetes Care and Education Specialists: www.diabeteseducator.org Summary  It is important to have healthy eating   habits because your blood sugar (glucose) levels are greatly affected by what you eat and drink.  A healthy meal plan will help you control your blood glucose and maintain a healthy lifestyle.  Your health care provider may recommend that you work with a dietitian to make a meal plan that is best for you.  Keep in mind that carbohydrates (carbs) and alcohol have immediate effects on your blood glucose levels. It is important to count carbs and to use alcohol carefully. This information is not intended to replace advice given to you by your health care provider. Make sure you discuss any questions you have with your health care provider. Document Revised: 09/25/2019 Document Reviewed: 09/25/2019 [employer] Patient Education  2021 [employer]  

## 2021-02-23 ENCOUNTER — Other Ambulatory Visit: Payer: Self-pay

## 2021-02-26 ENCOUNTER — Other Ambulatory Visit: Payer: Self-pay

## 2021-08-27 ENCOUNTER — Inpatient Hospital Stay (HOSPITAL_COMMUNITY)
Admission: EM | Admit: 2021-08-27 | Discharge: 2021-08-31 | DRG: 291 | Disposition: A | Discharge status: Home / Self Care | Payer: Self-pay | Attending: Internal Medicine | Admitting: Internal Medicine

## 2021-08-27 ENCOUNTER — Other Ambulatory Visit: Payer: Self-pay

## 2021-08-27 ENCOUNTER — Emergency Department (HOSPITAL_COMMUNITY): Payer: Self-pay

## 2021-08-27 DIAGNOSIS — Z7982 Long term (current) use of aspirin: Secondary | ICD-10-CM

## 2021-08-27 DIAGNOSIS — R778 Other specified abnormalities of plasma proteins: Secondary | ICD-10-CM

## 2021-08-27 DIAGNOSIS — E1151 Type 2 diabetes mellitus with diabetic peripheral angiopathy without gangrene: Secondary | ICD-10-CM | POA: Diagnosis present

## 2021-08-27 DIAGNOSIS — Z9114 Patient's other noncompliance with medication regimen: Secondary | ICD-10-CM

## 2021-08-27 DIAGNOSIS — I110 Hypertensive heart disease with heart failure: Principal | ICD-10-CM | POA: Diagnosis present

## 2021-08-27 DIAGNOSIS — K219 Gastro-esophageal reflux disease without esophagitis: Secondary | ICD-10-CM | POA: Diagnosis present

## 2021-08-27 DIAGNOSIS — I5043 Acute on chronic combined systolic (congestive) and diastolic (congestive) heart failure: Secondary | ICD-10-CM | POA: Diagnosis present

## 2021-08-27 DIAGNOSIS — Z951 Presence of aortocoronary bypass graft: Secondary | ICD-10-CM

## 2021-08-27 DIAGNOSIS — R739 Hyperglycemia, unspecified: Secondary | ICD-10-CM

## 2021-08-27 DIAGNOSIS — J9601 Acute respiratory failure with hypoxia: Secondary | ICD-10-CM | POA: Diagnosis present

## 2021-08-27 DIAGNOSIS — K5900 Constipation, unspecified: Secondary | ICD-10-CM | POA: Diagnosis present

## 2021-08-27 DIAGNOSIS — E1140 Type 2 diabetes mellitus with diabetic neuropathy, unspecified: Secondary | ICD-10-CM | POA: Diagnosis present

## 2021-08-27 DIAGNOSIS — Z79899 Other long term (current) drug therapy: Secondary | ICD-10-CM

## 2021-08-27 DIAGNOSIS — E669 Obesity, unspecified: Secondary | ICD-10-CM | POA: Diagnosis present

## 2021-08-27 DIAGNOSIS — Z7984 Long term (current) use of oral hypoglycemic drugs: Secondary | ICD-10-CM

## 2021-08-27 DIAGNOSIS — E1165 Type 2 diabetes mellitus with hyperglycemia: Secondary | ICD-10-CM

## 2021-08-27 DIAGNOSIS — I509 Heart failure, unspecified: Secondary | ICD-10-CM

## 2021-08-27 DIAGNOSIS — N179 Acute kidney failure, unspecified: Secondary | ICD-10-CM | POA: Diagnosis present

## 2021-08-27 DIAGNOSIS — E785 Hyperlipidemia, unspecified: Secondary | ICD-10-CM | POA: Diagnosis present

## 2021-08-27 DIAGNOSIS — R0602 Shortness of breath: Secondary | ICD-10-CM

## 2021-08-27 DIAGNOSIS — J439 Emphysema, unspecified: Secondary | ICD-10-CM | POA: Diagnosis present

## 2021-08-27 DIAGNOSIS — Z20822 Contact with and (suspected) exposure to covid-19: Secondary | ICD-10-CM | POA: Diagnosis present

## 2021-08-27 DIAGNOSIS — J810 Acute pulmonary edema: Secondary | ICD-10-CM

## 2021-08-27 DIAGNOSIS — Z23 Encounter for immunization: Secondary | ICD-10-CM

## 2021-08-27 DIAGNOSIS — I252 Old myocardial infarction: Secondary | ICD-10-CM

## 2021-08-27 DIAGNOSIS — Z833 Family history of diabetes mellitus: Secondary | ICD-10-CM

## 2021-08-27 DIAGNOSIS — Z8249 Family history of ischemic heart disease and other diseases of the circulatory system: Secondary | ICD-10-CM

## 2021-08-27 DIAGNOSIS — Z87891 Personal history of nicotine dependence: Secondary | ICD-10-CM

## 2021-08-27 DIAGNOSIS — I2510 Atherosclerotic heart disease of native coronary artery without angina pectoris: Secondary | ICD-10-CM | POA: Diagnosis present

## 2021-08-27 DIAGNOSIS — D72828 Other elevated white blood cell count: Secondary | ICD-10-CM | POA: Diagnosis present

## 2021-08-27 DIAGNOSIS — I739 Peripheral vascular disease, unspecified: Secondary | ICD-10-CM

## 2021-08-27 DIAGNOSIS — Z794 Long term (current) use of insulin: Secondary | ICD-10-CM

## 2021-08-27 DIAGNOSIS — I25810 Atherosclerosis of coronary artery bypass graft(s) without angina pectoris: Secondary | ICD-10-CM

## 2021-08-27 DIAGNOSIS — Z7901 Long term (current) use of anticoagulants: Secondary | ICD-10-CM

## 2021-08-27 DIAGNOSIS — D75838 Other thrombocytosis: Secondary | ICD-10-CM | POA: Diagnosis present

## 2021-08-27 LAB — I-STAT CHEM 8, ED
BUN: 19 mg/dL (ref 8–23)
Calcium, Ion: 1.01 mmol/L — ABNORMAL LOW (ref 1.15–1.40)
Chloride: 101 mmol/L (ref 98–111)
Creatinine, Ser: 1.3 mg/dL — ABNORMAL HIGH (ref 0.61–1.24)
Glucose, Bld: 589 mg/dL (ref 70–99)
HCT: 42 % (ref 39.0–52.0)
Hemoglobin: 14.3 g/dL (ref 13.0–17.0)
Potassium: 4.9 mmol/L (ref 3.5–5.1)
Sodium: 133 mmol/L — ABNORMAL LOW (ref 135–145)
TCO2: 21 mmol/L — ABNORMAL LOW (ref 22–32)

## 2021-08-27 LAB — CBC WITH DIFFERENTIAL/PLATELET
Abs Immature Granulocytes: 0.13 10*3/uL — ABNORMAL HIGH (ref 0.00–0.07)
Basophils Absolute: 0.1 10*3/uL (ref 0.0–0.1)
Basophils Relative: 1 %
Eosinophils Absolute: 0.2 10*3/uL (ref 0.0–0.5)
Eosinophils Relative: 1 %
HCT: 40.2 % (ref 39.0–52.0)
Hemoglobin: 13.3 g/dL (ref 13.0–17.0)
Immature Granulocytes: 1 %
Lymphocytes Relative: 11 %
Lymphs Abs: 2.4 10*3/uL (ref 0.7–4.0)
MCH: 30.4 pg (ref 26.0–34.0)
MCHC: 33.1 g/dL (ref 30.0–36.0)
MCV: 91.8 fL (ref 80.0–100.0)
Monocytes Absolute: 1.1 10*3/uL — ABNORMAL HIGH (ref 0.1–1.0)
Monocytes Relative: 5 %
Neutro Abs: 17.6 10*3/uL — ABNORMAL HIGH (ref 1.7–7.7)
Neutrophils Relative %: 81 %
Platelets: 508 10*3/uL — ABNORMAL HIGH (ref 150–400)
RBC: 4.38 MIL/uL (ref 4.22–5.81)
RDW: 13.9 % (ref 11.5–15.5)
WBC: 21.6 10*3/uL — ABNORMAL HIGH (ref 4.0–10.5)
nRBC: 0 % (ref 0.0–0.2)

## 2021-08-27 MED ORDER — INSULIN ASPART 100 UNIT/ML IJ SOLN
15.0000 [IU] | Freq: Once | INTRAMUSCULAR | Status: AC
  Administered 2021-08-28: 15 [IU] via INTRAVENOUS

## 2021-08-27 MED ORDER — FUROSEMIDE 10 MG/ML IJ SOLN
40.0000 mg | Freq: Once | INTRAMUSCULAR | Status: AC
  Administered 2021-08-28: 40 mg via INTRAVENOUS
  Filled 2021-08-27: qty 4

## 2021-08-27 NOTE — ED Provider Notes (Signed)
[facility] EMERGENCY DEPARTMENT Provider Note   CSN: [id number] Arrival date & time: 08/27/21  2251     History Chief Complaint  Patient presents with   Respiratory Distress    Robert Sanchez is a 65 y.o. male.  65 Yo M with a chief complaint of shortness of breath.  This is now happened to him twice in the past 2 nights.  Last night he laid down to go to sleep and had sudden severe shortness of breath.  He eventually recovered on his own and then laid down and went back to sleep.  Had a similar event happened today.  Was at work all day without issue and then laid back to go to sleep and became acutely short of breath.  Found by EMS to likely be in acute pulmonary edema oxygen saturation in the 70s improved with BiPAP and was started on amiodarone and was given treatment for possible COPD exacerbation.  Patient had some improvement of his blood pressure and is feeling bit better now.  He denies cough congestion or fever.  Denies chest pain or pressure.  He tells me he had a event like this with the shortness of breath when he had an MI in the past.  At that time he was having some significant chest discomfort.  States that this feels somewhat different.  He also had some coughing spells at night where he felt like he coughed up some blood potentially.  Has never had that happen to him before.  Denies recent travel denies fever.  The history is provided by the patient and the EMS personnel.  Illness Severity:  Severe Onset quality:  Sudden Duration:  30 minutes Timing:  Constant Progression:  Partially resolved Chronicity:  Recurrent Associated symptoms: cough and shortness of breath   Associated symptoms: no abdominal pain, no chest pain, no congestion, no diarrhea, no fever, no headaches, no myalgias, no rash and no vomiting       Past Medical History:  Diagnosis Date   Acute pulmonary edema (HCC) 06/05/2018   Arthritis    COPD (chronic obstructive  pulmonary disease) (HCC)    " beginning stages " per pt   Coronary artery disease    Diabetes mellitus without complication (HCC)    type 2   Diabetic neuropathy (HCC)    Diabetic neuropathy (HCC)    Emphysema/COPD (HCC)    " beginning stages"   GERD (gastroesophageal reflux disease)    HLD (hyperlipidemia)    HOH (hard of hearing)    no hearing aids   Myocardial infarction [facility])    Peripheral vascular disease (HCC)     Patient Active Problem List   Diagnosis Date Noted   Hypertension associated with diabetes (HCC) 02/19/2021   Critical limb ischemia with history of revascularization of same extremity (HCC) 01/06/2021   Acute venous embolism and thrombosis of deep vessels of proximal lower extremity (HCC) 07/16/2020   Cellulitis of foot 01/15/2020   Critical ischemia of extremity with history of revascularization of same extremity (HCC) 01/15/2020   Leukocytosis 01/15/2020   CKD (chronic kidney disease), stage III (HCC) 01/15/2020   Acute on chronic systolic CHF (congestive heart failure) (HCC) 05/04/2019   Mild renal insufficiency 05/04/2019   LFT elevation 05/04/2019   PAD (peripheral artery disease) (HCC) 08/21/2018   PVD (peripheral vascular disease) (HCC) 07/18/2018   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   CAD (coronary artery disease) 06/05/2018   Pulmonary edema 06/03/2018   DKA, type 2 (  HCC) 06/03/2018   Diabetic acidosis without coma (HCC)    Sebaceous cyst 08/23/2016   Acute pain of right knee 08/23/2016   Hidradenitis 08/23/2016   Right hip pain 08/14/2014   Diabetes mellitus type II, non insulin dependent (HCC) 11/27/2013   Arthritis 11/27/2013   Neuropathy in diabetes (HCC) 11/27/2013    Past Surgical History:  Procedure Laterality Date   ABDOMINAL AORTOGRAM W/LOWER EXTREMITY N/A 06/21/2018   Procedure: ABDOMINAL AORTOGRAM W/LOWER EXTREMITY;  Surgeon: Cephus Shelling, MD;  Location: [facility] INVASIVE CV LAB;  Service: Cardiovascular;  Laterality: N/A;   ABDOMINAL  AORTOGRAM W/LOWER EXTREMITY Left 01/16/2020   Procedure: ABDOMINAL AORTOGRAM W/LOWER EXTREMITY;  Surgeon: Cephus Shelling, MD;  Location: [facility] INVAS[facility] CV LAB;  Service: Cardiovascular;  Laterality: Left;   ABDOMINAL AORTOGRAM W/LOWER EXTREMITY N/A 07/09/2020   Procedure: ABDOMINAL AORTOGRAM W/LOWER EXTREMITY;  Surgeon: Cephus Shelling, MD;  Location: [facility] INVAS[facility] CV LAB;  Service: Cardiovascular;  Laterality: N/A;  TPA infusion   AMPUTATION Left 08/04/2020   Procedure: LEFT PARTIAL GREAT TOE AMPUTATION;  Surgeon: Cephus Shelling, MD;  Location: [facility] OR;  Service: Vascular;  Laterality: Left;   APPLICATION OF WOUND VAC Left 08/04/2020   Procedure: LEFT GROIN DEBRIDEMENT WITH APPLICATION OF WOUND VAC;  Surgeon: Cephus Shelling, MD;  Location: [facility] OR;  Service: Vascular;  Laterality: Left;   CORONARY ARTERY BYPASS GRAFT N/A 06/12/2018   Procedure: CORONARY ARTERY BYPASS GRAFTING (CABG) x 3 WITH ENDOSCOPIC HARVESTING OF RIGHT GREATER SAPHENOUS VEIN. LIMA TO LAD. SVG TO PD. SVG TO DIAGONAL.;  Surgeon: Kerin Perna, MD;  Location: [facility] OR;  Service: Open Heart Surgery;  Laterality: N/A;   FEMORAL-POPLITEAL BYPASS GRAFT Left 07/10/2020   Procedure: REDO EXPOSURE OF LEFT COMMON FEMORAL ARTERY LEFT COMMON FEMORAL TO POSTERIOR TIBIAL COMPOSITE BYPASS GRAFT;  Surgeon: Cephus Shelling, MD;  Location: [facility] OR;  Service: Vascular;  Laterality: Left;   FEMORAL-TIBIAL BYPASS GRAFT Left 08/21/2018   Procedure: BYPASS GRAFT LEFT FEMORAL TO POSTERIOR TIBIAL ARTERY USING LEFT REVERSED GREAT SAPHENOUS VEIN;  Surgeon: Cephus Shelling, MD;  Location: [facility] OR;  Service: Vascular;  Laterality: Left;   FEMORAL-TIBIAL BYPASS GRAFT Left 08/04/2020   Procedure: REVISION FEMORAL-DISTAL BYPASS WITH BIOLOGICAL BOVINE PATCH;  Surgeon: Cephus Shelling, MD;  Location: [facility] OR;  Service: Vascular;  Laterality: Left;   KNEE ARTHROSCOPY     LOWER EXTREMITY ANGIOGRAM Left 08/04/2020   Procedure: LEFT ILIAC ANGIOGRAM,  ANGIOPLASTY OF LEFT ILIAC STENT WITH DRUG COATED BALLOON;  Surgeon: Cephus Shelling, MD;  Location: [facility] OR;  Service: Vascular;  Laterality: Left;   LOWER EXTREMITY ANGIOGRAPHY Left 01/17/2020   Procedure: Lower Extremity Angiography;  Surgeon: Cephus Shelling, MD;  Location: [facility] INVASIVE CV LAB;  Service: Cardiovascular;  Laterality: Left;   LOWER EXTREMITY ANGIOGRAPHY N/A 01/07/2021   Procedure: LOWER EXTREMITY ANGIOGRAPHY;  Surgeon: Leonie Douglas, MD;  [facility]: [facility];  Service: Cardiovascular;  Laterality: N/A;   PERIPHERAL VASCULAR BALLOON ANGIOPLASTY Left 01/17/2020   Procedure: PERIPHERAL VASCULAR BALLOON ANGIOPLASTY;  Surgeon: Cephus Shelling, MD;  [facility];  Service: Cardiovascular;  Laterality: Left;  pt   PERIPHERAL VASCULAR INTERVENTION Left 06/21/2018   Procedure: PERIPHERAL VASCULAR INTERVENTION;  Surgeon: Cephus Shelling, MD;  [facility]: [facility];  Service: Cardiovascular;  Laterality: Left;   PERIPHERAL VASCULAR INTERVENTION Left 01/08/2021   Procedure: PERIPHERAL VASCULAR INTERVENTION;  Surgeon: Cephus Shelling, MD;  [facility]: [facility];  Service:  Cardiovascular;  Laterality: Left;   PERIPHERAL VASCULAR THROMBECTOMY Left 01/16/2020   Procedure: PERIPHERAL VASCULAR THROMBECTOMY;  Surgeon: Cephus Shelling, MD;  [facility];  Service: Cardiovascular;  Laterality: Left;  Lytic Catheter Placement left fem-pop bypass   PERIPHERAL VASCULAR THROMBECTOMY Left 01/17/2020   Procedure: PERIPHERAL VASCULAR THROMBECTOMY;  Surgeon: Cephus Shelling, MD;  [facility];  Service: Cardiovascular;  Laterality: Left;  fem-pt bypass   PERIPHERAL VASCULAR THROMBECTOMY Left 07/10/2020   Procedure: lysis recheck;  Surgeon: Cephus Shelling, MD;  Location: [facility] INVASIVE CV LAB;  Service: Cardiovascular;  Laterality: Left;   PULMONARY THROMBECTOMY N/A 01/08/2021   Procedure: LYSIS RECHECK;  Surgeon:  Cephus Shelling, MD;  [facility]: [facility];  Service: Cardiovascular;  Laterality: N/A;   RIGHT/LEFT HEART CATH AND CORONARY ANGIOGRAPHY N/A 06/05/2018   Procedure: RIGHT/LEFT HEART CATH AND CORONARY ANGIOGRAPHY;  Surgeon: Orpah Cobb, MD;  [facility]: [facility];  Service: Cardiovascular;  Laterality: N/A;   SPINE SURGERY     TEE WITHOUT CARDIOVERSION N/A 06/12/2018   Procedure: TRANSESOPHAGEAL ECHOCARDIOGRAM (TEE);  Surgeon: Donata Clay, Theron Arista, MD;  Location: [facility] OR;  Service: Open Heart Surgery;  Laterality: N/A;   teeth extractions     THROMBECTOMY FEMORAL ARTERY Left 08/04/2020   Procedure: THROMBECTOMY OF LEFT FEMORAL TP COMPOSTITE BYPASS;  Surgeon: Cephus Shelling, MD;  Location: [facility] OR;  Service: Vascular;  Laterality: Left;   THROMBECTOMY ILIAC ARTERY Left 07/10/2020   Procedure: THROMBECTOMY OF LEFT EXTERNAL ILIAC AND LEFT COMMON FEMORAL AND LEFT POSTERIOR TIBIAL ARTERIES;  Surgeon: Cephus Shelling, MD;  Location: [facility] OR;  Service: Vascular;  Laterality: Left;   VEIN HARVEST Left 08/21/2018   Procedure: VEIN HARVEST LEFT GREAT SAPHENOUS VEIN;  Surgeon: Cephus Shelling, MD;  Location: [facility] OR;  Service: Vascular;  Laterality: Left;       Family History  Problem Relation Age of Onset   Diabetes Mother    Lung disease Mother    Cancer Father    Heart disease Father     Social History   Tobacco Use   Smoking status: Former    Packs/day: 1.00    Years: 40.00    Pack years: 40.00    Types: Cigarettes    Quit date: 06/02/2018    Years since quitting: 3.2   Smokeless tobacco: Never  Vaping Use   Vaping Use: Never used  Substance Use Topics   Alcohol use: Yes    Comment: occasional   Drug use: Not Currently    Types: Marijuana    Comment: quit yrs ago    Home Medications Prior to Admission medications   Medication Sig Start Date End Date Taking? Authorizing Provider  aspirin EC 81 MG EC tablet Take 1 tablet (81 mg total) by mouth daily at 6 (six)  AM. Swallow whole. 07/12/20   Lars Mage, PA-C  atorvastatin (LIPITOR) 80 MG tablet TAKE 1 TABLET (80 MG TOTAL) BY MOUTH AT BEDTIME. 02/19/21 02/19/22  Hoy Register, MD  carvedilol (COREG) 6.25 MG tablet Take 1 tablet (6.25 mg total) by mouth 2 (two) times daily with a meal. 02/19/21   Hoy Register, MD  furosemide (LASIX) 40 MG tablet Take 1 tablet (40 mg total) by mouth daily. 02/19/21   Hoy Register, MD  glipiZIDE (GLUCOTROL) 10 MG tablet Take 1 tablet (10 mg total) by mouth 2 (two) times daily before a meal. 02/19/21   Hoy Register, MD  insulin glargine (LANTUS  SOLOSTAR) 100 UNIT/ML Solostar Pen Inject 10 Units into the skin daily. 02/19/21   Hoy Register, MD  Insulin Pen Needle 31G X 5 MM MISC 1 each at bedtime. 02/19/21   Hoy Register, MD  metFORMIN (GLUCOPHAGE) 1000 MG tablet Take 1 tablet (1,000 mg total) by mouth 2 (two) times daily with a meal. 02/19/21   Hoy Register, MD  Multiple Vitamin (MULTIVITAMIN) tablet Take 1 tablet by mouth daily.    [provider]  potassium chloride SA (KLOR-CON) 20 MEQ tablet Take 1 tablet (20 mEq total) by mouth daily. 02/19/21   Hoy Register, MD  rivaroxaban (XARELTO) 20 MG TABS tablet TAKE 1 TABLET (20 MG TOTAL) BY MOUTH DAILY WITH SUPPER. 02/10/21 02/10/22  Cephus Shelling, MD  RIVAROXABAN Carlena Hurl) VTE STARTER PACK (15 & 20 MG) FOLLOW PACKAGE DIRECTIONS: TAKE ONE  TABLET BY MOUTH TWICE A DAY. ON DAY 22, SWITCH TO ONE  TABLET ONCE A DAY. TAKE WITH FOOD. Patient not taking: Reported on 02/19/2021 01/12/21 01/12/22  Cephus Shelling, MD    Allergies    Lisinopril and Codeine  Review of Systems   Review of Systems  Constitutional:  Negative for chills and fever.  HENT:  Negative for congestion and facial swelling.   Eyes:  Negative for discharge and visual disturbance.  Respiratory:  Positive for cough and shortness of breath.   Cardiovascular:  Negative for chest pain and palpitations.  Gastrointestinal:   Negative for abdominal pain, diarrhea and vomiting.  Musculoskeletal:  Negative for arthralgias and myalgias.  Skin:  Negative for color change and rash.  Neurological:  Negative for tremors, syncope and headaches.  Psychiatric/Behavioral:  Negative for confusion and dysphoric mood.    Physical Exam Updated Vital Signs BP (!) 150/87   Pulse (!) 111   Temp 99.3 F (37.4 C) (Axillary)   Resp (!) 29   Ht  (1.727 m)   Wt 102.1 kg   SpO2 98%   BMI 34.21 kg/m   Physical Exam Vitals and nursing note reviewed.  Constitutional:      Appearance: He is well-developed.  HENT:     Head: Normocephalic and atraumatic.  Eyes:     Pupils: Pupils are equal, round, and reactive to light.  Neck:     Vascular: No JVD.  Cardiovascular:     Rate and Rhythm: Normal rate and regular rhythm.     Heart sounds: No murmur heard.   No friction rub. No gallop.  Pulmonary:     Effort: No respiratory distress.     Breath sounds: Rales present. No wheezing.     Comments: Rales up to mid lung bilaterally. Abdominal:     General: There is no distension.     Tenderness: There is no abdominal tenderness. There is no guarding or rebound.  Musculoskeletal:        General: Normal range of motion.     Cervical back: Normal range of motion and neck supple.     Right lower leg: Edema present.     Left lower leg: Edema present.     Comments: 1+ pitting edema to bilateral lower extremities up to the knees.  Skin:    Coloration: Skin is not pale.     Findings: No rash.  Neurological:     Mental Status: He is alert and oriented to person, place, and time.  Psychiatric:        Behavior: Behavior normal.    ED Results / Procedures / Treatments  Labs (all labs ordered are listed, but only abnormal results are displayed) Labs Reviewed  CBC WITH DIFFERENTIAL/PLATELET - Abnormal; Notable for the following components:      Result Value   WBC 21.6 (*)    Platelets 508 (*)    Neutro Abs 17.6 (*)     Monocytes Absolute 1.1 (*)    Abs Immature Granulocytes 0.13 (*)    All other components within normal limits  BASIC METABOLIC PANEL - Abnormal; Notable for the following components:   Sodium 130 (*)    Chloride 97 (*)    CO2 19 (*)    Glucose, Bld 563 (*)    Creatinine, Ser 1.41 (*)    Calcium 8.5 (*)    GFR, Estimated 56 (*)    All other components within normal limits  BRAIN NATRIURETIC PEPTIDE - Abnormal; Notable for the following components:   B Natriuretic Peptide 408.5 (*)    All other components within normal limits  I-STAT CHEM 8, ED - Abnormal; Notable for the following components:   Sodium 133 (*)    Creatinine, Ser 1.30 (*)    Glucose, Bld 589 (*)    Calcium, Ion 1.01 (*)    TCO2 21 (*)    All other components within normal limits  TROPONIN I (HIGH SENSITIVITY) - Abnormal; Notable for the following components:   Troponin I (High Sensitivity) 32 (*)    All other components within normal limits  RESP PANEL BY RT-PCR (FLU A&B, COVID) [id number]    EKG EKG Interpretation  Date/Time:  Thursday August 27 2021 22:57:42 EDT Ventricular Rate:  118 PR Interval:  153 QRS Duration: 111 QT Interval:  342 QTC Calculation: 480 R Axis:   107 Text Interpretation: Sinus tachycardia Multiform ventricular premature complexes Probable left atrial enlargement Left posterior fascicular block Otherwise no significant change Confirmed by Melene Plan [phone]) on 08/27/2021 10:59:31 PM  Radiology DG Chest Port 1 View  Result Date: 08/27/2021 CLINICAL DATA:  Shortness of breath. EXAM: PORTABLE CHEST 1 VIEW COMPARISON:  January 15, 2020 FINDINGS: Multiple sternal wires are noted. Moderate severity diffusely increased interstitial lung markings are seen. This is increased in severity when compared to the prior study. There is no evidence of a pleural effusion or pneumothorax. The cardiac silhouette is mildly enlarged. The visualized skeletal structures are unremarkable. IMPRESSION: Findings  consistent with moderate severity interstitial edema. Electronically Signed   By: Aram Candela M.D.   On: 08/27/2021 23:29    Procedures Procedures   Medications Ordered in ED Medications  furosemide (LASIX) injection 40 mg (40 mg Intravenous Given 08/28/21 0002)  insulin aspart (novoLOG) injection 15 Units (15 Units Intravenous Given 08/28/21 0003)    ED Course  I have reviewed the triage vital signs and the nursing notes.  Pertinent labs & imaging results that were available during my care of the patient were reviewed by me and considered in my medical decision making (see chart for details).    MDM Rules/Calculators/A&P                           65 yo M with a chief complaints of shortness of breath.  Patient has had 2 events that sound like acute pulmonary edema.  Was reportedly significantly hypertensive in route with EMS and had improved with nitroglycerin.  Was taken off of CPAP upon arrival and doing reasonably well here.  Some persistent tachypnea and tachycardia.  Will obtain a chest x-ray blood  work reassess.  Chest x-ray viewed by me consistent with fluid overload.  BNP mildly elevated troponin mildly elevated appears to be at his baseline.  On reassessment the patient has had some significant diuresis with IV Lasix.  Tells me he is feeling a bit better.  Still tachypneic still requiring supplemental oxygen.  Will discuss with medicine.  CRITICAL CARE Performed by: Rae Roam   Total critical care time: 35 minutes  Critical care time was exclusive of separately billable procedures and treating other patients.  Critical care was necessary to treat or prevent imminent or life-threatening deterioration.  Critical care was time spent personally by me on the following activities: development of treatment plan with patient and/or surrogate as well as nursing, discussions with consultants, evaluation of patient's response to treatment, examination of patient,  obtaining history from patient or surrogate, ordering and performing treatments and interventions, ordering and review of laboratory studies, ordering and review of radiographic studies, pulse oximetry and re-evaluation of patient's condition.  Final Clinical Impression(s) / ED Diagnoses Final diagnoses:  Acute pulmonary edema [facility])    Rx / DC Orders ED Discharge Orders     None        Melene Plan, DO 08/28/21 [phone]

## 2021-08-27 NOTE — ED Triage Notes (Signed)
Pt bib ems from home c/o respiratory distress. Pt walked to neighbor home to get help. EMS stated he was dyspneic O2 70%. Pt placed on CPAP O2 95% en route. Pt has hx MI. Pt received:  10 mg albuterol  0.5 mg Atrovent 125 mg Solumedrol 1:1 epi IM deltoid 2 g Magnesium 150 mg Amiodarone 1 nitroglycerin  sublingual   BP 166/102 HR 120 RR 26

## 2021-08-28 ENCOUNTER — Encounter (HOSPITAL_COMMUNITY): Payer: Self-pay | Admitting: Internal Medicine

## 2021-08-28 ENCOUNTER — Inpatient Hospital Stay (HOSPITAL_COMMUNITY): Payer: Self-pay

## 2021-08-28 DIAGNOSIS — I5043 Acute on chronic combined systolic (congestive) and diastolic (congestive) heart failure: Secondary | ICD-10-CM

## 2021-08-28 DIAGNOSIS — R7989 Other specified abnormal findings of blood chemistry: Secondary | ICD-10-CM

## 2021-08-28 DIAGNOSIS — R778 Other specified abnormalities of plasma proteins: Secondary | ICD-10-CM

## 2021-08-28 DIAGNOSIS — I509 Heart failure, unspecified: Secondary | ICD-10-CM

## 2021-08-28 DIAGNOSIS — J9601 Acute respiratory failure with hypoxia: Secondary | ICD-10-CM

## 2021-08-28 DIAGNOSIS — N179 Acute kidney failure, unspecified: Secondary | ICD-10-CM

## 2021-08-28 DIAGNOSIS — R739 Hyperglycemia, unspecified: Secondary | ICD-10-CM

## 2021-08-28 LAB — ECHOCARDIOGRAM COMPLETE
AR max vel: 2.51 cm2
AV Peak grad: 7 mmHg
Ao pk vel: 1.33 m/s
Area-P 1/2: 3.99 cm2
Calc EF: 35.9 %
Height: 68 in
S' Lateral: 4.9 cm
Single Plane A2C EF: 32.7 %
Single Plane A4C EF: 36.7 %
Weight: 3600 oz

## 2021-08-28 LAB — BASIC METABOLIC PANEL
Anion gap: 14 (ref 5–15)
Anion gap: 12 (ref 5–15)
Anion gap: 9 (ref 5–15)
BUN: 17 mg/dL (ref 8–23)
BUN: 20 mg/dL (ref 8–23)
BUN: 26 mg/dL — ABNORMAL HIGH (ref 8–23)
CO2: 19 mmol/L — ABNORMAL LOW (ref 22–32)
CO2: 19 mmol/L — ABNORMAL LOW (ref 22–32)
CO2: 24 mmol/L (ref 22–32)
Calcium: 8.5 mg/dL — ABNORMAL LOW (ref 8.9–10.3)
Calcium: 8.8 mg/dL — ABNORMAL LOW (ref 8.9–10.3)
Calcium: 8.8 mg/dL — ABNORMAL LOW (ref 8.9–10.3)
Chloride: 97 mmol/L — ABNORMAL LOW (ref 98–111)
Chloride: 102 mmol/L (ref 98–111)
Chloride: 100 mmol/L (ref 98–111)
Creatinine, Ser: 1.41 mg/dL — ABNORMAL HIGH (ref 0.61–1.24)
Creatinine, Ser: 1.34 mg/dL — ABNORMAL HIGH (ref 0.61–1.24)
Creatinine, Ser: 1.34 mg/dL — ABNORMAL HIGH (ref 0.61–1.24)
GFR, Estimated: 56 mL/min — ABNORMAL LOW (ref >60)
GFR, Estimated: 60 mL/min — ABNORMAL LOW (ref >60)
GFR, Estimated: 60 mL/min — ABNORMAL LOW (ref >60)
Glucose, Bld: 563 mg/dL (ref 70–99)
Glucose, Bld: 291 mg/dL — ABNORMAL HIGH (ref 70–99)
Glucose, Bld: 432 mg/dL — ABNORMAL HIGH (ref 70–99)
Potassium: 4.9 mmol/L (ref 3.5–5.1)
Potassium: 3.6 mmol/L (ref 3.5–5.1)
Potassium: 3.9 mmol/L (ref 3.5–5.1)
Sodium: 130 mmol/L — ABNORMAL LOW (ref 135–145)
Sodium: 133 mmol/L — ABNORMAL LOW (ref 135–145)
Sodium: 133 mmol/L — ABNORMAL LOW (ref 135–145)

## 2021-08-28 LAB — RESP PANEL BY RT-PCR (FLU A&B, COVID) ARPGX2
Influenza A by PCR: NEGATIVE
Influenza B by PCR: NEGATIVE
SARS Coronavirus 2 by RT PCR: NEGATIVE

## 2021-08-28 LAB — GLUCOSE, CAPILLARY
Glucose-Capillary: 210 mg/dL — ABNORMAL HIGH (ref 70–99)
Glucose-Capillary: 265 mg/dL — ABNORMAL HIGH (ref 70–99)
Glucose-Capillary: 378 mg/dL — ABNORMAL HIGH (ref 70–99)
Glucose-Capillary: 386 mg/dL — ABNORMAL HIGH (ref 70–99)
Glucose-Capillary: 441 mg/dL — ABNORMAL HIGH (ref 70–99)
Glucose-Capillary: 447 mg/dL — ABNORMAL HIGH (ref 70–99)

## 2021-08-28 LAB — CBG MONITORING, ED
Glucose-Capillary: 452 mg/dL — ABNORMAL HIGH (ref 70–99)
Glucose-Capillary: 382 mg/dL — ABNORMAL HIGH (ref 70–99)
Glucose-Capillary: 413 mg/dL — ABNORMAL HIGH (ref 70–99)
Glucose-Capillary: 314 mg/dL — ABNORMAL HIGH (ref 70–99)
Glucose-Capillary: 345 mg/dL — ABNORMAL HIGH (ref 70–99)
Glucose-Capillary: 263 mg/dL — ABNORMAL HIGH (ref 70–99)
Glucose-Capillary: 251 mg/dL — ABNORMAL HIGH (ref 70–99)
Glucose-Capillary: 229 mg/dL — ABNORMAL HIGH (ref 70–99)
Glucose-Capillary: 283 mg/dL — ABNORMAL HIGH (ref 70–99)
Glucose-Capillary: 449 mg/dL — ABNORMAL HIGH (ref 70–99)

## 2021-08-28 LAB — URINALYSIS, ROUTINE W REFLEX MICROSCOPIC
Bacteria, UA: NONE SEEN
Bilirubin Urine: NEGATIVE
Glucose, UA: >=500 mg/dL — AB
Ketones, ur: 5 mg/dL — AB
Leukocytes,Ua: NEGATIVE
Nitrite: NEGATIVE
Protein, ur: 100 mg/dL — AB
Specific Gravity, Urine: 1.016 (ref 1.005–1.030)
pH: 5 (ref 5.0–8.0)

## 2021-08-28 LAB — I-STAT ARTERIAL BLOOD GAS, ED
Acid-base deficit: 2 mmol/L (ref 0.0–2.0)
Bicarbonate: 22 mmol/L (ref 20.0–28.0)
Calcium, Ion: 1.14 mmol/L — ABNORMAL LOW (ref 1.15–1.40)
HCT: 39 % (ref 39.0–52.0)
Hemoglobin: 13.3 g/dL (ref 13.0–17.0)
O2 Saturation: 96 %
Patient temperature: 98.6
Potassium: 3.7 mmol/L (ref 3.5–5.1)
Sodium: 135 mmol/L (ref 135–145)
TCO2: 23 mmol/L (ref 22–32)
pCO2 arterial: 34.8 mmHg (ref 32.0–48.0)
pH, Arterial: 7.408 (ref 7.350–7.450)
pO2, Arterial: 82 mmHg — ABNORMAL LOW (ref 83.0–108.0)

## 2021-08-28 LAB — CBC
HCT: 37.8 % — ABNORMAL LOW (ref 39.0–52.0)
Hemoglobin: 12.5 g/dL — ABNORMAL LOW (ref 13.0–17.0)
MCH: 29.7 pg (ref 26.0–34.0)
MCHC: 33.1 g/dL (ref 30.0–36.0)
MCV: 89.8 fL (ref 80.0–100.0)
Platelets: 389 10*3/uL (ref 150–400)
RBC: 4.21 MIL/uL — ABNORMAL LOW (ref 4.22–5.81)
RDW: 13.8 % (ref 11.5–15.5)
WBC: 12.1 10*3/uL — ABNORMAL HIGH (ref 4.0–10.5)
nRBC: 0 % (ref 0.0–0.2)

## 2021-08-28 LAB — HEMOGLOBIN A1C
Hgb A1c MFr Bld: 13.3 % — ABNORMAL HIGH (ref 4.8–5.6)
Mean Plasma Glucose: 335.01 mg/dL

## 2021-08-28 LAB — TROPONIN I (HIGH SENSITIVITY)
Troponin I (High Sensitivity): 32 ng/L — ABNORMAL HIGH (ref <18)
Troponin I (High Sensitivity): 48 ng/L — ABNORMAL HIGH (ref <18)
Troponin I (High Sensitivity): 108 ng/L (ref <18)
Troponin I (High Sensitivity): 98 ng/L — ABNORMAL HIGH (ref <18)
Troponin I (High Sensitivity): 89 ng/L — ABNORMAL HIGH (ref <18)

## 2021-08-28 LAB — BETA-HYDROXYBUTYRIC ACID: Beta-Hydroxybutyric Acid: 0.12 mmol/L (ref 0.05–0.27)

## 2021-08-28 LAB — PROCALCITONIN: Procalcitonin: 0.83 ng/mL

## 2021-08-28 LAB — BRAIN NATRIURETIC PEPTIDE: B Natriuretic Peptide: 408.5 pg/mL — ABNORMAL HIGH (ref 0.0–100.0)

## 2021-08-28 LAB — HIV ANTIBODY (ROUTINE TESTING W REFLEX): HIV Screen 4th Generation wRfx: NONREACTIVE

## 2021-08-28 MED ORDER — INSULIN REGULAR(HUMAN) IN NACL 100-0.9 UT/100ML-% IV SOLN
INTRAVENOUS | Status: DC
  Administered 2021-08-28: 15 [IU]/h via INTRAVENOUS
  Filled 2021-08-28: qty 100

## 2021-08-28 MED ORDER — INSULIN ASPART 100 UNIT/ML IJ SOLN: 14.0000 [IU] | Freq: Three times a day (TID) | INTRAMUSCULAR | Status: DC

## 2021-08-28 MED ORDER — ACETAMINOPHEN 650 MG RE SUPP: 650.0000 mg | Freq: Four times a day (QID) | RECTAL | Status: DC | PRN

## 2021-08-28 MED ORDER — INSULIN STARTER KIT- PEN NEEDLES (ENGLISH)
1.0000 | Freq: Once | Status: AC
  Administered 2021-08-28: 1
  Filled 2021-08-28: qty 1

## 2021-08-28 MED ORDER — INSULIN GLARGINE 100 UNIT/ML SOLOSTAR PEN: 10.0000 [IU] | PEN_INJECTOR | Freq: Every day | SUBCUTANEOUS | Status: DC

## 2021-08-28 MED ORDER — DEXTROSE 50 % IV SOLN: 0.0000 mL | INTRAVENOUS | Status: DC | PRN

## 2021-08-28 MED ORDER — INSULIN ASPART 100 UNIT/ML IJ SOLN
25.0000 [IU] | Freq: Once | INTRAMUSCULAR | Status: AC
  Administered 2021-08-28: 25 [IU] via SUBCUTANEOUS

## 2021-08-28 MED ORDER — ACETAMINOPHEN 325 MG PO TABS: 650.0000 mg | ORAL_TABLET | Freq: Four times a day (QID) | ORAL | Status: DC | PRN

## 2021-08-28 MED ORDER — INSULIN REGULAR(HUMAN) IN NACL 100-0.9 UT/100ML-% IV SOLN
INTRAVENOUS | Status: DC
  Administered 2021-08-28: 18 [IU]/h via INTRAVENOUS
  Administered 2021-08-28: 19 [IU]/h via INTRAVENOUS
  Filled 2021-08-28 (×2): qty 100

## 2021-08-28 MED ORDER — ALBUTEROL SULFATE (2.5 MG/3ML) 0.083% IN NEBU: 2.5000 mg | INHALATION_SOLUTION | Freq: Four times a day (QID) | RESPIRATORY_TRACT | Status: DC | PRN

## 2021-08-28 MED ORDER — INSULIN GLARGINE-YFGN 100 UNIT/ML ~~LOC~~ SOLN: 35.0000 [IU] | Freq: Every day | SUBCUTANEOUS | Status: DC

## 2021-08-28 MED ORDER — INSULIN ASPART 100 UNIT/ML IJ SOLN
0.0000 [IU] | Freq: Three times a day (TID) | INTRAMUSCULAR | Status: DC
  Administered 2021-08-28: 8 [IU] via SUBCUTANEOUS

## 2021-08-28 MED ORDER — POTASSIUM CHLORIDE CRYS ER 20 MEQ PO TBCR
20.0000 meq | EXTENDED_RELEASE_TABLET | Freq: Once | ORAL | Status: AC
  Administered 2021-08-28: 20 meq via ORAL
  Filled 2021-08-28: qty 1

## 2021-08-28 MED ORDER — ASPIRIN EC 81 MG PO TBEC
81.0000 mg | DELAYED_RELEASE_TABLET | Freq: Every day | ORAL | Status: DC
  Administered 2021-08-28 – 2021-08-31 (×4): 81 mg via ORAL
  Filled 2021-08-28 (×4): qty 1

## 2021-08-28 MED ORDER — INSULIN ASPART 100 UNIT/ML IJ SOLN
8.0000 [IU] | Freq: Three times a day (TID) | INTRAMUSCULAR | Status: DC
  Administered 2021-08-28: 8 [IU] via SUBCUTANEOUS

## 2021-08-28 MED ORDER — INSULIN GLARGINE 100 UNIT/ML ~~LOC~~ SOLN
20.0000 [IU] | Freq: Every day | SUBCUTANEOUS | Status: DC
  Filled 2021-08-28: qty 0.2

## 2021-08-28 MED ORDER — INSULIN GLARGINE-YFGN 100 UNIT/ML ~~LOC~~ SOLN
20.0000 [IU] | Freq: Every day | SUBCUTANEOUS | Status: DC
  Administered 2021-08-28: 20 [IU] via SUBCUTANEOUS
  Filled 2021-08-28: qty 0.2

## 2021-08-28 MED ORDER — FUROSEMIDE 10 MG/ML IJ SOLN: 40.0000 mg | Freq: Two times a day (BID) | INTRAMUSCULAR | Status: DC

## 2021-08-28 MED ORDER — DOXYCYCLINE HYCLATE 100 MG PO TABS
100.0000 mg | ORAL_TABLET | Freq: Two times a day (BID) | ORAL | Status: DC
  Administered 2021-08-28: 100 mg via ORAL
  Filled 2021-08-28 (×2): qty 1

## 2021-08-28 MED ORDER — IPRATROPIUM-ALBUTEROL 0.5-2.5 (3) MG/3ML IN SOLN
3.0000 mL | Freq: Four times a day (QID) | RESPIRATORY_TRACT | Status: DC
  Administered 2021-08-28 – 2021-08-30 (×9): 3 mL via RESPIRATORY_TRACT
  Filled 2021-08-28 (×11): qty 3

## 2021-08-28 MED ORDER — INSULIN ASPART 100 UNIT/ML IJ SOLN
0.0000 [IU] | Freq: Three times a day (TID) | INTRAMUSCULAR | Status: DC
  Administered 2021-08-28: 17 [IU] via SUBCUTANEOUS

## 2021-08-28 MED ORDER — LACTATED RINGERS IV SOLN: INTRAVENOUS | Status: DC

## 2021-08-28 MED ORDER — PERFLUTREN LIPID MICROSPHERE
1.0000 mL | INTRAVENOUS | Status: AC | PRN
  Administered 2021-08-28: 2 mL via INTRAVENOUS
  Filled 2021-08-28: qty 10

## 2021-08-28 MED ORDER — CARVEDILOL 3.125 MG PO TABS: 6.2500 mg | ORAL_TABLET | Freq: Two times a day (BID) | ORAL | Status: DC

## 2021-08-28 MED ORDER — ATORVASTATIN CALCIUM 80 MG PO TABS
80.0000 mg | ORAL_TABLET | Freq: Every day | ORAL | Status: DC
  Administered 2021-08-28 – 2021-08-30 (×3): 80 mg via ORAL
  Filled 2021-08-28 (×3): qty 1

## 2021-08-28 MED ORDER — LIVING WELL WITH DIABETES BOOK
Freq: Once | Status: AC
  Filled 2021-08-28: qty 1

## 2021-08-28 MED ORDER — RIVAROXABAN 20 MG PO TABS
20.0000 mg | ORAL_TABLET | Freq: Every day | ORAL | Status: DC
  Administered 2021-08-28 – 2021-08-30 (×3): 20 mg via ORAL
  Filled 2021-08-28 (×3): qty 1

## 2021-08-28 MED ORDER — INFLUENZA VAC SPLIT QUAD 0.5 ML IM SUSY
0.5000 mL | PREFILLED_SYRINGE | INTRAMUSCULAR | Status: AC
  Administered 2021-08-31: 0.5 mL via INTRAMUSCULAR

## 2021-08-28 MED ORDER — DEXTROSE IN LACTATED RINGERS 5 % IV SOLN: INTRAVENOUS | Status: DC

## 2021-08-28 MED ORDER — FUROSEMIDE 10 MG/ML IJ SOLN
40.0000 mg | Freq: Two times a day (BID) | INTRAMUSCULAR | Status: DC
  Administered 2021-08-28 – 2021-08-29 (×4): 40 mg via INTRAVENOUS
  Filled 2021-08-28 (×4): qty 4

## 2021-08-28 NOTE — ED Notes (Signed)
Dr. Jerral Ralph notified of cbg of 449; verbal order given to 25 units novolog SQ x 1

## 2021-08-28 NOTE — ED Notes (Signed)
Pt transitioned to 6L  - sats 100%. Pt reports he feels as though he is breathing normally.

## 2021-08-28 NOTE — Progress Notes (Signed)
PROGRESS NOTE        PATIENT DETAILS Name: Robert Sanchez Age: 65 y.o. Sex: male Date of Birth: 07-Oct-1956 Admit Date: 08/27/2021 Admitting Physician John Giovanni, MD DTO:[email or web address], Odette Horns, MD  Brief Narrative: Patient is a 65 y.o. male with history of CAD s/p CABG, DM-2, HFrEF, PAD, medication noncompliance presented with acute hypoxic respiratory failure (initially on CPAP/NRB) due to decompensated systolic heart failure.  See below for further details.  Subjective: Feels better-Down to 3-4 L of oxygen.  Objective: Vitals: Blood pressure 119/74, pulse 83, temperature 99.3 F (37.4 C), temperature source Axillary, resp. rate 19, height 5\' 8"  (1.727 m), weight 102.1 kg, SpO2 93 %.   Exam: Gen Exam:Alert awake-not in any distress HEENT:atraumatic, normocephalic Chest: Bibasilar rales. CVS:S1S2 regular Abdomen:soft non tender, non distended Extremities: Trace edema Neurology: Non focal Skin: no rash  Pertinent Labs/Radiology: WBC: 12.1 Creatinine: 1.34  10/27>>CXR: Bilateral interstitial edema  Assessment/Plan: Acute hypoxic respiratory failure due to HFrEF with exacerbation: Remains volume overloaded-continues to have bibasilar rales-on 3-4 L of oxygen-resume Lasix 40 mg IV twice daily.  If BP stable over the next few days-we can contemplate restarting beta-blocker-and adding Entresto.  Await updated echocardiogram.  Doubt patient had COPD exacerbation.  DM-2 with uncontrolled hyperglycemia (A1c 13.3 on 10/28): Titrated off insulin infusion this morning-continue Lantus 20 units daily-add 8 units of NovoLog with meals-change SSI to resistant scale.  Follow and adjust.  AKI: Mild-likely hemodynamically mediated/osmotic diuresis-watch closely.  Leukocytosis: Likely reactive-indication of infection-stop doxycycline and monitor off all antimicrobial therapy.  Minimally elevated troponin: Insignificant-due to demand ischemia-no indication of  ACS.  History of CAD s/p CABG: No chest pain-SOB from decompensated heart failure.  History of PAD: Has had several vascular procedures in lower extremities-remains on Xarelto/aspirin.  HTN: BP soft this morning-hold Coreg  HLD: Continue Lipitor  Noncompliance with medication/follow-up: Counseled  Obesity: Estimated body mass index is 34.21 kg/m as calculated from the following:   Height as of this encounter: 5\' 8"  (1.727 m).   Weight as of this encounter: 102.1 kg.   Procedures: None Consults: None DVT Prophylaxis: Xarelto Code Status:Full code  Family Communication: None at bedside-he does not wish for me to talk to his sister or other family members (ED RN present at bedside)  Time spent: 35 minutes-Greater than 50% of this time was spent in counseling, explanation of diagnosis, planning of further management, and coordination of care.  Diet: Diet Order             Diet heart healthy/carb modified Room service appropriate? Yes; Fluid consistency: Thin  Diet effective now                      Disposition Plan: Status is: Inpatient  Remains inpatient appropriate because: Severe hypoxia due to decompensated heart failure-on IV Lasix.  Not yet stable for discharge.   Antimicrobial agents: Anti-infectives (From admission, onward)    Start     Dose/Rate Route Frequency Ordered Stop   08/28/21 0415  doxycycline (VIBRA-TABS) tablet 100 mg        100 mg Oral Every 12 hours 08/28/21 0409          MEDICATIONS: Scheduled Meds:  aspirin EC  81 mg Oral Daily   atorvastatin  80 mg Oral QHS   doxycycline  100 mg Oral Q12H   furosemide  40 mg Intravenous BID   insulin aspart  0-20 Units Subcutaneous TID WC   insulin aspart  8 Units Subcutaneous TID WC   insulin glargine-yfgn  20 Units Subcutaneous Daily   ipratropium-albuterol  3 mL Nebulization Q6H   rivaroxaban  20 mg Oral Q supper   Continuous Infusions: PRN Meds:.acetaminophen **OR** acetaminophen   I  have personally reviewed following labs and imaging studies  LABORATORY DATA: CBC: Recent Labs  Lab 08/27/21 2316 08/27/21 2323 08/28/21 0237 08/28/21 0453  WBC 21.6*  --   --  12.1*  NEUTROABS 17.6*  --   --   --   HGB 13.3 14.3 13.3 12.5*  HCT 40.2 42.0 39.0 37.8*  MCV 91.8  --   --  89.8  PLT 508*  --   --  389    Basic Metabolic Panel: Recent Labs  Lab 08/27/21 2316 08/27/21 2323 08/28/21 0237 08/28/21 0453  NA 130* 133* 135 133*  K 4.9 4.9 3.7 3.6  CL 97* 101  --  102  CO2 19*  --   --  19*  GLUCOSE 563* 589*  --  291*  BUN 17 19  --  20  CREATININE 1.41* 1.30*  --  1.34*  CALCIUM 8.5*  --   --  8.8*    GFR: Estimated Creatinine Clearance: 65.4 mL/min (A) (by C-G formula based on SCr of 1.34 mg/dL (H)).  Liver Function Tests: No results for input(s): AST, ALT, ALKPHOS, BILITOT, PROT, ALBUMIN in the last 168 hours. No results for input(s): LIPASE, AMYLASE in the last 168 hours. No results for input(s): AMMONIA in the last 168 hours.  Coagulation Profile: No results for input(s): INR, PROTIME in the last 168 hours.  Cardiac Enzymes: No results for input(s): CKTOTAL, CKMB, CKMBINDEX, TROPONINI in the last 168 hours.  BNP (last 3 results) No results for input(s): PROBNP in the last 8760 hours.  Lipid Profile: No results for input(s): CHOL, HDL, LDLCALC, TRIG, CHOLHDL, LDLDIRECT in the last 72 hours.  Thyroid Function Tests: No results for input(s): TSH, T4TOTAL, FREET4, T3FREE, THYROIDAB in the last 72 hours.  Anemia Panel: No results for input(s): VITAMINB12, FOLATE, FERRITIN, TIBC, IRON, RETICCTPCT in the last 72 hours.  Urine analysis:    Component Value Date/Time   COLORURINE STRAW (A) 08/28/2021 0153   APPEARANCEUR CLEAR 08/28/2021 0153   LABSPEC 1.016 08/28/2021 0153   PHURINE 5.0 08/28/2021 0153   GLUCOSEU >=500 (A) 08/28/2021 0153   HGBUR SMALL (A) 08/28/2021 0153   BILIRUBINUR NEGATIVE 08/28/2021 0153   KETONESUR 5 (A) 08/28/2021 0153    PROTEINUR 100 (A) 08/28/2021 0153   NITRITE NEGATIVE 08/28/2021 0153   LEUKOCYTESUR NEGATIVE 08/28/2021 0153    Sepsis Labs: Lactic Acid, Venous    Component Value Date/Time   LATICACIDVEN 2.8 (HH) 07/31/2020 1730    MICROBIOLOGY: Recent Results (from the past 240 hour(s))  Resp Panel by RT-PCR (Flu A&B, Covid) Nasopharyngeal Swab     Status: None   Collection Time: 08/28/21 12:44 AM   Specimen: Nasopharyngeal Swab; Nasopharyngeal(NP) swabs in vial transport medium  Result Value Ref Range Status   SARS Coronavirus 2 by RT PCR NEGATIVE NEGATIVE Final    Comment: (NOTE) SARS-CoV-2 target nucleic acids are NOT DETECTED.  The SARS-CoV-2 RNA is generally detectable in upper respiratory specimens during the acute phase of infection. The lowest concentration of SARS-CoV-2 viral copies this assay can detect is 138 copies/mL. A negative result does not preclude SARS-Cov-2 infection and should not be  used as the sole basis for treatment or other patient management decisions. A negative result may occur with  improper specimen collection/handling, submission of specimen other than nasopharyngeal swab, presence of viral mutation(s) within the areas targeted by this assay, and inadequate number of viral copies(<138 copies/mL). A negative result must be combined with clinical observations, patient history, and epidemiological information. The expected result is Negative.  Fact Sheet for Patients:  BloggerCourse.com  Fact Sheet for Healthcare Providers:  SeriousBroker.it  This test is no t yet approved or cleared by the [place] FDA and  has been authorized for detection and/or diagnosis of SARS-CoV-2 by FDA under an Emergency Use Authorization (EUA). This EUA will remain  in effect (meaning this test can be used) for the duration of the COVID-19 declaration under Section 564(b)(1) of the Act, 21 U.S.C.section 360bbb-3(b)(1),  unless the authorization is terminated  or revoked sooner.       Influenza A by PCR NEGATIVE NEGATIVE Final   Influenza B by PCR NEGATIVE NEGATIVE Final    Comment: (NOTE) The Xpert Xpress SARS-CoV-2/FLU/RSV plus assay is intended as an aid in the diagnosis of influenza from Nasopharyngeal swab specimens and should not be used as a sole basis for treatment. Nasal washings and aspirates are unacceptable for Xpert Xpress SARS-CoV-2/FLU/RSV testing.  Fact Sheet for Patients: BloggerCourse.com  Fact Sheet for Healthcare Providers: SeriousBroker.it  This test is not yet approved or cleared by the [place] FDA and has been authorized for detection and/or diagnosis of SARS-CoV-2 by FDA under an Emergency Use Authorization (EUA). This EUA will remain in effect (meaning this test can be used) for the duration of the COVID-19 declaration under Section 564(b)(1) of the Act, 21 U.S.C. section 360bbb-3(b)(1), unless the authorization is terminated or revoked.  Performed at [facility], [address], [place], [place] [address]     RADIOLOGY STUDIES/RESULTS: DG Chest Port 1 View  Result Date: 08/27/2021 CLINICAL DATA:  Shortness of breath. EXAM: PORTABLE CHEST 1 VIEW COMPARISON:  January 15, 2020 FINDINGS: Multiple sternal wires are noted. Moderate severity diffusely increased interstitial lung markings are seen. This is increased in severity when compared to the prior study. There is no evidence of a pleural effusion or pneumothorax. The cardiac silhouette is mildly enlarged. The visualized skeletal structures are unremarkable. IMPRESSION: Findings consistent with moderate severity interstitial edema. Electronically Signed   By: Aram Candela M.D.   On: 08/27/2021 23:29     LOS: 0 days   Jeoffrey Massed, MD  [facility]    To contact the attending provider between 7A-7P or the covering provider during after hours  7P-7A, please log into the web site [email or web address] and access using universal [place] password for that web site. If you do not have the password, please call the hospital operator.  08/28/2021, 12:11 PM

## 2021-08-28 NOTE — ED Notes (Signed)
Dr. Danny Lawless at bedside; pt verbalized that he does not want any information/updates given to family

## 2021-08-28 NOTE — ED Notes (Signed)
Insulin drip stopped per verbal order from Dr. Beacher May.

## 2021-08-28 NOTE — Significant Event (Signed)
Repeat trop 108, but pt not having CP.  As noted in H&P: suspect trop elevation = demand ischemia in setting of CHF.  Will continue to trend for now.

## 2021-08-28 NOTE — ED Notes (Signed)
ED TO INPATIENT HANDOFF REPORT  ED Nurse Name and Phone #:  [facility] [place] 1610  S Name/Age/Gender Ronda Fairly 65 y.o. male Room/Bed: 032C/032C  Code Status   Code Status: Full Code  Home/SNF/Other Home Patient oriented to: self, place, time, and situation Is this baseline? Yes   Triage Complete: Triage complete  Chief Complaint CHF exacerbation (HCC) [I50.9]  Triage Note Pt bib ems from home c/o respiratory distress. Pt walked to neighbor home to get help. EMS stated he was dyspneic O2 70%. Pt placed on CPAP O2 95% en route. Pt has hx MI. Pt received:  10 mg albuterol  0.5 mg Atrovent 125 mg Solumedrol 1:1 epi IM deltoid 2 g Magnesium 150 mg Amiodarone 1 nitroglycerin  sublingual   BP 166/102 HR 120 RR 26     Allergies Allergies  Allergen Reactions   Lisinopril Other (See Comments)    Syncope   Codeine Rash    Level of Care/Admitting Diagnosis ED Disposition     ED Disposition  Admit   Condition  --   Comment  Hospital Area: [facility] [[id number]]  Level of Care: Progressive [102]  Admit to Progressive based on following criteria: CARDIOVASCULAR & THORACIC of moderate stability with acute coronary syndrome symptoms/low risk myocardial infarction/hypertensive urgency/arrhythmias/heart failure potentially compromising stability and stable post cardiovascular intervention patients.  Admit to Progressive based on following criteria: GI, ENDOCRINE disease patients with GI bleeding, acute liver failure or pancreatitis, stable with diabetic ketoacidosis or thyrotoxicosis (hypothyroid) state.  May admit patient to [facility] or [facility] if equivalent level of care is available:: Yes  Covid Evaluation: Asymptomatic Screening Protocol (No Symptoms)  Diagnosis: CHF exacerbation [facility]) [[id number]]  Admitting Physician: John Giovanni [[id number]]  Attending Physician: John Giovanni [[id number]]  Estimated length of stay: past midnight tomorrow   Certification:: I certify this patient will need inpatient services for at least 2 midnights          B Medical/Surgery History Past Medical History:  Diagnosis Date   Acute pulmonary edema (HCC) 06/05/2018   Arthritis    COPD (chronic obstructive pulmonary disease) (HCC)    " beginning stages " per pt   Coronary artery disease    Diabetes mellitus without complication (HCC)    type 2   Diabetic neuropathy (HCC)    Diabetic neuropathy (HCC)    Emphysema/COPD (HCC)    " beginning stages"   GERD (gastroesophageal reflux disease)    HLD (hyperlipidemia)    HOH (hard of hearing)    no hearing aids   Myocardial infarction [facility])    Peripheral vascular disease (HCC)    Past Surgical History:  Procedure Laterality Date   ABDOMINAL AORTOGRAM W/LOWER EXTREMITY N/A 06/21/2018   Procedure: ABDOMINAL AORTOGRAM W/LOWER EXTREMITY;  Surgeon: Cephus Shelling, MD;  [facility];  Service: Cardiovascular;  Laterality: N/A;   ABDOMINAL AORTOGRAM W/LOWER EXTREMITY Left 01/16/2020   Procedure: ABDOMINAL AORTOGRAM W/LOWER EXTREMITY;  Surgeon: Cephus Shelling, MD;  [facility];  Service: Cardiovascular;  Laterality: Left;   ABDOMINAL AORTOGRAM W/LOWER EXTREMITY N/A 07/09/2020   Procedure: ABDOMINAL AORTOGRAM W/LOWER EXTREMITY;  Surgeon: Cephus Shelling, MD;  [facility];  Service: Cardiovascular;  Laterality: N/A;  TPA infusion   AMPUTATION Left 08/04/2020   Procedure: LEFT PARTIAL GREAT TOE AMPUTATION;  Surgeon: Cephus Shelling, MD;  Location: [facility] OR;  Service: Vascular;  Laterality: Left;   APPLICATION OF WOUND VAC Left 08/04/2020  Procedure: LEFT GROIN DEBRIDEMENT WITH APPLICATION OF WOUND VAC;  Surgeon: Cephus Shelling, MD;  [facility] OR;  Service: Vascular;  Laterality: Left;   CORONARY ARTERY BYPASS GRAFT N/A 06/12/2018   Procedure: CORONARY ARTERY BYPASS GRAFTING (CABG) x 3 WITH ENDOSCOPIC HARVESTING OF RIGHT GREATER  SAPHENOUS VEIN. LIMA TO LAD. SVG TO PD. SVG TO DIAGONAL.;  Surgeon: Kerin Perna, MD;  Location: [facility] OR;  Service: Open Heart Surgery;  Laterality: N/A;   FEMORAL-POPLITEAL BYPASS GRAFT Left 07/10/2020   Procedure: REDO EXPOSURE OF LEFT COMMON FEMORAL ARTERY LEFT COMMON FEMORAL TO POSTERIOR TIBIAL COMPOSITE BYPASS GRAFT;  Surgeon: Cephus Shelling, MD;  [facility];  Service: Vascular;  Laterality: Left;   FEMORAL-TIBIAL BYPASS GRAFT Left 08/21/2018   Procedure: BYPASS GRAFT LEFT FEMORAL TO POSTERIOR TIBIAL ARTERY USING LEFT REVERSED GREAT SAPHENOUS VEIN;  Surgeon: Cephus Shelling, MD;  [facility];  Service: Vascular;  Laterality: Left;   FEMORAL-TIBIAL BYPASS GRAFT Left 08/04/2020   Procedure: REVISION FEMORAL-DISTAL BYPASS WITH BIOLOGICAL BOVINE PATCH;  Surgeon: Cephus Shelling, MD;  Location: [facility] OR;  Service: Vascular;  Laterality: Left;   KNEE ARTHROSCOPY     LOWER EXTREMITY ANGIOGRAM Left 08/04/2020   Procedure: LEFT ILIAC ANGIOGRAM, ANGIOPLASTY OF LEFT ILIAC STENT WITH DRUG COATED BALLOON;  Surgeon: Cephus Shelling, MD;  [facility];  Service: Vascular;  Laterality: Left;   LOWER EXTREMITY ANGIOGRAPHY Left 01/17/2020   Procedure: Lower Extremity Angiography;  Surgeon: Cephus Shelling, MD;  Location: [facility] INVASIVE CV LAB;  Service: Cardiovascular;  Laterality: Left;   LOWER EXTREMITY ANGIOGRAPHY N/A 01/07/2021   Procedure: LOWER EXTREMITY ANGIOGRAPHY;  Surgeon: Leonie Douglas, MD;  [facility];  Service: Cardiovascular;  Laterality: N/A;   PERIPHERAL VASCULAR BALLOON ANGIOPLASTY Left 01/17/2020   Procedure: PERIPHERAL VASCULAR BALLOON ANGIOPLASTY;  Surgeon: Cephus Shelling, MD;  [facility];  Service: Cardiovascular;  Laterality: Left;  pt   PERIPHERAL VASCULAR INTERVENTION Left 06/21/2018   Procedure: PERIPHERAL VASCULAR INTERVENTION;  Surgeon: Cephus Shelling, MD;  [facility];  Service:  Cardiovascular;  Laterality: Left;   PERIPHERAL VASCULAR INTERVENTION Left 01/08/2021   Procedure: PERIPHERAL VASCULAR INTERVENTION;  Surgeon: Cephus Shelling, MD;  [facility];  Service: Cardiovascular;  Laterality: Left;   PERIPHERAL VASCULAR THROMBECTOMY Left 01/16/2020   Procedure: PERIPHERAL VASCULAR THROMBECTOMY;  Surgeon: Cephus Shelling, MD;  [facility];  Service: Cardiovascular;  Laterality: Left;  Lytic Catheter Placement left fem-pop bypass   PERIPHERAL VASCULAR THROMBECTOMY Left 01/17/2020   Procedure: PERIPHERAL VASCULAR THROMBECTOMY;  Surgeon: Cephus Shelling, MD;  [facility];  Service: Cardiovascular;  Laterality: Left;  fem-pt bypass   PERIPHERAL VASCULAR THROMBECTOMY Left 07/10/2020   Procedure: lysis recheck;  Surgeon: Cephus Shelling, MD;  Location: [facility] INVASIVE CV LAB;  Service: Cardiovascular;  Laterality: Left;   PULMONARY THROMBECTOMY N/A 01/08/2021   Procedure: LYSIS RECHECK;  Surgeon: Cephus Shelling, MD;  [facility];  Service: Cardiovascular;  Laterality: N/A;   RIGHT/LEFT HEART CATH AND CORONARY ANGIOGRAPHY N/A 06/05/2018   Procedure: RIGHT/LEFT HEART CATH AND CORONARY ANGIOGRAPHY;  Surgeon: Orpah Cobb, MD;  [facility];  Service: Cardiovascular;  Laterality: N/A;   SPINE SURGERY     TEE WITHOUT CARDIOVERSION N/A 06/12/2018   Procedure: TRANSESOPHAGEAL ECHOCARDIOGRAM (TEE);  Surgeon: Donata Clay, Theron Arista, MD;  Location: [facility] OR;  Service: Open Heart Surgery;  Laterality: N/A;   teeth extractions     THROMBECTOMY FEMORAL ARTERY Left 08/04/2020   Procedure: THROMBECTOMY OF LEFT FEMORAL TP COMPOSTITE BYPASS;  Surgeon: Cephus Shelling, MD;  [facility];  Service: Vascular;  Laterality: Left;   THROMBECTOMY ILIAC ARTERY Left 07/10/2020   Procedure: THROMBECTOMY OF LEFT EXTERNAL ILIAC AND LEFT COMMON FEMORAL AND LEFT POSTERIOR TIBIAL ARTERIES;  Surgeon: Cephus Shelling, MD;  [facility];  Service: Vascular;  Laterality: Left;   VEIN HARVEST Left 08/21/2018   Procedure: VEIN HARVEST LEFT GREAT SAPHENOUS VEIN;  Surgeon: Cephus Shelling, MD;  [facility];  Service: Vascular;  Laterality: Left;     A IV Location/Drains/Wounds Patient Lines/Drains/Airways Status     Active Line/Drains/Airways     Name Placement date Placement time Site Days   Peripheral IV 08/27/21 20 G Anterior;Distal;Left Forearm 08/27/21  2358  Forearm  1   Peripheral IV 08/27/21 18 G Left;Posterior Hand 08/27/21  0000  Hand  1   External Urinary Catheter 01/07/21  1430  --  233   Incision (Closed) 08/04/20 Groin Left 08/04/20  1633  -- 389   Incision (Closed) 08/04/20 Leg Left 08/04/20  1700  -- 389   Incision (Closed) 08/04/20 Toe (Comment  which one) Left 08/04/20  1700  -- 389            Intake/Output Last 24 hours  Intake/Output Summary (Last 24 hours) at 08/28/2021 1131 Last data filed at 08/28/2021 0837 Gross per 24 hour  Intake 98.86 ml  Output 1300 ml  Net -1201.14 ml    Labs/Imaging Results for orders placed or performed during the hospital encounter of 08/27/21 (from the past 48 hour(s))  CBC with Differential     Status: Abnormal   Collection Time: 08/27/21 11:16 PM  Result Value Ref Range   WBC 21.6 (H) 4.0 - 10.5 K/uL   RBC 4.38 4.22 - 5.81 MIL/uL   Hemoglobin 13.3 13.0 - 17.0 g/dL   HCT 03.5 59.7 - 41.6 %   MCV 91.8 80.0 - 100.0 fL   MCH 30.4 26.0 - 34.0 pg   MCHC 33.1 30.0 - 36.0 g/dL   RDW 38.4 53.6 - 46.8 %   Platelets 508 (H) 150 - 400 K/uL   nRBC 0.0 0.0 - 0.2 %   Neutrophils Relative % 81 %   Neutro Abs 17.6 (H) 1.7 - 7.7 K/uL   Lymphocytes Relative 11 %   Lymphs Abs 2.4 0.7 - 4.0 K/uL   Monocytes Relative 5 %   Monocytes Absolute 1.1 (H) 0.1 - 1.0 K/uL   Eosinophils Relative 1 %   Eosinophils Absolute 0.2 0.0 - 0.5 K/uL   Basophils Relative 1 %   Basophils Absolute 0.1 0.0 - 0.1 K/uL   Immature Granulocytes 1 %   Abs  Immature Granulocytes 0.13 (H) 0.00 - 0.07 K/uL    Comment: Performed at [facility], [address], [place], [place] [address]  Basic metabolic panel     Status: Abnormal   Collection Time: 08/27/21 11:16 PM  Result Value Ref Range   Sodium 130 (L) 135 - 145 mmol/L   Potassium 4.9 3.5 - 5.1 mmol/L   Chloride 97 (L) 98 - 111 mmol/L   CO2 19 (L) 22 - 32 mmol/L   Glucose, Bld 563 (HH) 70 - 99 mg/dL    Comment: Glucose reference range applies only to samples taken after fasting for at least 8 hours. CRITICAL RESULT CALLED  TO, READ BACK BY AND VERIFIED WITH: ALEXIS DAVISON RN 08/28/21 0032 M KOROLESKI    BUN 17 8 - 23 mg/dL   Creatinine, Ser 4.09 (H) 0.61 - 1.24 mg/dL   Calcium 8.5 (L) 8.9 - 10.3 mg/dL   GFR, Estimated 56 (L) >60 mL/min    Comment: (NOTE) Calculated using the CKD-EPI Creatinine Equation (2021)    Anion gap 14 5 - 15    Comment: Performed at [facility] Lab, [address], [place], [place] [address]  Troponin I (High Sensitivity)     Status: Abnormal   Collection Time: 08/27/21 11:16 PM  Result Value Ref Range   Troponin I (High Sensitivity) 32 (H) <18 ng/L    Comment: (NOTE) Elevated high sensitivity troponin I (hsTnI) values and significant  changes across serial measurements may suggest ACS but many other  chronic and acute conditions are known to elevate hsTnI results.  Refer to the Links section for chest pain algorithms and additional  guidance. Performed at [facility], [address] [place], [place] [address]   Brain natriuretic peptide     Status: Abnormal   Collection Time: 08/27/21 11:16 PM  Result Value Ref Range   B Natriuretic Peptide 408.5 (H) 0.0 - 100.0 pg/mL    Comment: Performed at [facility] Lab, [address] [place], [place] [address]  I-stat chem 8, ED (not at [facility] or [facility])     Status: Abnormal   Collection Time: 08/27/21 11:23 PM  Result Value Ref Range   Sodium 133 (L) 135 - 145 mmol/L   Potassium 4.9 3.5 -  5.1 mmol/L   Chloride 101 98 - 111 mmol/L   BUN 19 8 - 23 mg/dL   Creatinine, Ser 0.86 (H) 0.61 - 1.24 mg/dL   Glucose, Bld 578 (HH) 70 - 99 mg/dL    Comment: Glucose reference range applies only to samples taken after fasting for at least 8 hours.   Calcium, Ion 1.01 (L) 1.15 - 1.40 mmol/L   TCO2 21 (L) 22 - 32 mmol/L   Hemoglobin 14.3 13.0 - 17.0 g/dL   HCT 46.9 62.9 - 52.8 %   Comment NOTIFIED PHYSICIAN   Resp Panel by RT-PCR (Flu A&B, Covid) Nasopharyngeal Swab     Status: None   Collection Time: 08/28/21 12:44 AM   Specimen: Nasopharyngeal Swab; Nasopharyngeal(NP) swabs in vial transport medium  Result Value Ref Range   SARS Coronavirus 2 by RT PCR NEGATIVE NEGATIVE    Comment: (NOTE) SARS-CoV-2 target nucleic acids are NOT DETECTED.  The SARS-CoV-2 RNA is generally detectable in upper respiratory specimens during the acute phase of infection. The lowest concentration of SARS-CoV-2 viral copies this assay can detect is 138 copies/mL. A negative result does not preclude SARS-Cov-2 infection and should not be used as the sole basis for treatment or other patient management decisions. A negative result may occur with  improper specimen collection/handling, submission of specimen other than nasopharyngeal swab, presence of viral mutation(s) within the areas targeted by this assay, and inadequate number of viral copies(<138 copies/mL). A negative result must be combined with clinical observations, patient history, and epidemiological information. The expected result is Negative.  Fact Sheet for Patients:  BloggerCourse.com  Fact Sheet for Healthcare Providers:  SeriousBroker.it  This test is no t yet approved or cleared by the [place] FDA and  has been authorized for detection and/or diagnosis of SARS-CoV-2 by FDA under an Emergency Use Authorization (EUA). This EUA will remain  in effect (meaning this test can be used)  for the duration of the COVID-19 declaration under Section 564(b)(1) of the Act, 21 U.S.C.section 360bbb-3(b)(1), unless the authorization is terminated  or revoked sooner.       Influenza A by PCR NEGATIVE NEGATIVE   Influenza B by PCR NEGATIVE NEGATIVE    Comment: (NOTE) The Xpert Xpress SARS-CoV-2/FLU/RSV plus assay is intended as an aid in the diagnosis of influenza from Nasopharyngeal swab specimens and should not be used as a sole basis for treatment. Nasal washings and aspirates are unacceptable for Xpert Xpress SARS-CoV-2/FLU/RSV testing.  Fact Sheet for Patients: BloggerCourse.com  Fact Sheet for Healthcare Providers: SeriousBroker.it  This test is not yet approved or cleared by the [place] FDA and has been authorized for detection and/or diagnosis of SARS-CoV-2 by FDA under an Emergency Use Authorization (EUA). This EUA will remain in effect (meaning this test can be used) for the duration of the COVID-19 declaration under Section 564(b)(1) of the Act, 21 U.S.C. section 360bbb-3(b)(1), unless the authorization is terminated or revoked.  Performed at [facility] Lab, [address] [place], [place] [address]   CBG monitoring, ED     Status: Abnormal   Collection Time: 08/28/21  1:35 AM  Result Value Ref Range   Glucose-Capillary 452 (H) 70 - 99 mg/dL    Comment: Glucose reference range applies only to samples taken after fasting for at least 8 hours.  Troponin I (High Sensitivity)     Status: Abnormal   Collection Time: 08/28/21  1:42 AM  Result Value Ref Range   Troponin I (High Sensitivity) 48 (H) <18 ng/L    Comment: (NOTE) Elevated high sensitivity troponin I (hsTnI) values and significant  changes across serial measurements may suggest ACS but many other  chronic and acute conditions are known to elevate hsTnI results.  Refer to the "Links" section for chest pain algorithms and additional   guidance. Performed at [facility] Lab, [address] [place] [address], [place], [place] [address]   Urinalysis, Routine w reflex microscopic Nasopharyngeal Swab     Status: Abnormal   Collection Time: 08/28/21  1:53 AM  Result Value Ref Range   Color, Urine STRAW (A) YELLOW   APPearance CLEAR CLEAR   Specific Gravity, Urine 1.016 1.005 - 1.030   pH 5.0 5.0 - 8.0   Glucose, UA >=500 (A) NEGATIVE mg/dL   Hgb urine dipstick SMALL (A) NEGATIVE   Bilirubin Urine NEGATIVE NEGATIVE   Ketones, ur 5 (A) NEGATIVE mg/dL   Protein, ur 696 (A) NEGATIVE mg/dL   Nitrite NEGATIVE NEGATIVE   Leukocytes,Ua NEGATIVE NEGATIVE   RBC / HPF 0-5 0 - 5 RBC/hpf   WBC, UA 0-5 0 - 5 WBC/hpf   Bacteria, UA NONE SEEN NONE SEEN    Comment: Performed at [facility] Lab, [address] [place], [place] [address]  I-Stat arterial blood gas, ED     Status: Abnormal   Collection Time: 08/28/21  2:37 AM  Result Value Ref Range   pH, Arterial 7.408 7.350 - 7.450   pCO2 arterial 34.8 32.0 - 48.0 mmHg   pO2, Arterial 82 (L) 83.0 - 108.0 mmHg   Bicarbonate 22.0 20.0 - 28.0 mmol/L   TCO2 23 22 - 32 mmol/L   O2 Saturation 96.0 %   Acid-base deficit 2.0 0.0 - 2.0 mmol/L   Sodium 135 135 - 145 mmol/L   Potassium 3.7 3.5 - 5.1 mmol/L   Calcium, Ion 1.14 (L) 1.15 -  1.40 mmol/L   HCT 39.0 39.0 - 52.0 %   Hemoglobin 13.3 13.0 - 17.0 g/dL   Patient temperature 24.2 F    Collection site RADIAL, ALLEN'S TEST ACCEPTABLE    Drawn by RT    Sample type ARTERIAL   CBG monitoring, ED     Status: Abnormal   Collection Time: 08/28/21  2:42 AM  Result Value Ref Range   Glucose-Capillary 413 (H) 70 - 99 mg/dL    Comment: Glucose reference range applies only to samples taken after fasting for at least 8 hours.  CBG monitoring, ED     Status: Abnormal   Collection Time: 08/28/21  3:08 AM  Result Value Ref Range   Glucose-Capillary 382 (H) 70 - 99 mg/dL    Comment: Glucose reference range applies only to samples taken after fasting for  at least 8 hours.  CBG monitoring, ED     Status: Abnormal   Collection Time: 08/28/21  3:51 AM  Result Value Ref Range   Glucose-Capillary 345 (H) 70 - 99 mg/dL    Comment: Glucose reference range applies only to samples taken after fasting for at least 8 hours.  CBG monitoring, ED     Status: Abnormal   Collection Time: 08/28/21  4:09 AM  Result Value Ref Range   Glucose-Capillary 314 (H) 70 - 99 mg/dL    Comment: Glucose reference range applies only to samples taken after fasting for at least 8 hours.  HIV Antibody (routine testing w rflx)     Status: None   Collection Time: 08/28/21  4:53 AM  Result Value Ref Range   HIV Screen 4th Generation wRfx Non Reactive Non Reactive    Comment: Performed at [facility], [address] [place], [place] [address]  Hemoglobin A1c     Status: Abnormal   Collection Time: 08/28/21  4:53 AM  Result Value Ref Range   Hgb A1c MFr Bld 13.3 (H) 4.8 - 5.6 %    Comment: (NOTE) Pre diabetes:          5.7%-6.4%  Diabetes:              >6.4%  Glycemic control for   <7.0% adults with diabetes    Mean Plasma Glucose 335.01 mg/dL    Comment: Performed at [facility] Lab, [address] [place], [place] [address]  Beta-hydroxybutyric acid     Status: None   Collection Time: 08/28/21  4:53 AM  Result Value Ref Range   Beta-Hydroxybutyric Acid 0.12 0.05 - 0.27 mmol/L    Comment: Performed at [facility], [address] [place], [place] [address]  Basic metabolic panel     Status: Abnormal   Collection Time: 08/28/21  4:53 AM  Result Value Ref Range   Sodium 133 (L) 135 - 145 mmol/L   Potassium 3.6 3.5 - 5.1 mmol/L   Chloride 102 98 - 111 mmol/L   CO2 19 (L) 22 - 32 mmol/L   Glucose, Bld 291 (H) 70 - 99 mg/dL    Comment: Glucose reference range applies only to samples taken after fasting for at least 8 hours.   BUN 20 8 - 23 mg/dL   Creatinine, Ser 2.11 (H) 0.61 - 1.24 mg/dL   Calcium 8.8 (L) 8.9 - 10.3 mg/dL   GFR, Estimated 60  (L) >60 mL/min    Comment: (NOTE) Calculated using the CKD-EPI Creatinine Equation (2021)    Anion gap 12 5 - 15  Comment: Performed at [facility] Lab, [address], [place], [place] [address]  CBC     Status: Abnormal   Collection Time: 08/28/21  4:53 AM  Result Value Ref Range   WBC 12.1 (H) 4.0 - 10.5 K/uL   RBC 4.21 (L) 4.22 - 5.81 MIL/uL   Hemoglobin 12.5 (L) 13.0 - 17.0 g/dL   HCT 60.4 (L) 54.0 - 98.1 %   MCV 89.8 80.0 - 100.0 fL   MCH 29.7 26.0 - 34.0 pg   MCHC 33.1 30.0 - 36.0 g/dL   RDW 19.1 47.8 - 29.5 %   Platelets 389 150 - 400 K/uL   nRBC 0.0 0.0 - 0.2 %    Comment: Performed at [facility], [address] [place], [place] [address]  Procalcitonin - Baseline     Status: None   Collection Time: 08/28/21  4:53 AM  Result Value Ref Range   Procalcitonin 0.83 ng/mL    Comment:        Interpretation: PCT > 0.5 ng/mL and <= 2 ng/mL: Systemic infection (sepsis) is possible, but other conditions are known to elevate PCT as well. (NOTE)       Sepsis PCT Algorithm           Lower Respiratory Tract                                      Infection PCT Algorithm    ----------------------------     ----------------------------         PCT < 0.25 ng/mL                PCT < 0.10 ng/mL          Strongly encourage             Strongly discourage   discontinuation of antibiotics    initiation of antibiotics    ----------------------------     -----------------------------       PCT 0.25 - 0.50 ng/mL            PCT 0.10 - 0.25 ng/mL               OR       >80% decrease in PCT            Discourage initiation of                                            antibiotics      Encourage discontinuation           of antibiotics    ----------------------------     -----------------------------         PCT >= 0.50 ng/mL              PCT 0.26 - 0.50 ng/mL                AND       <80% decrease in PCT             Encourage initiation of  antibiotics       Encourage continuation           of antibiotics    ----------------------------     -----------------------------        PCT >= 0.50 ng/mL                  PCT > 0.50 ng/mL               AND         increase in PCT                  Strongly encourage                                      initiation of antibiotics    Strongly encourage escalation           of antibiotics                                     -----------------------------                                           PCT <= 0.25 ng/mL                                                 OR                                        > 80% decrease in PCT                                      Discontinue / Do not initiate                                             antibiotics  Performed at [facility], [address], [place], [place] [address]   Troponin I (High Sensitivity)     Status: Abnormal   Collection Time: 08/28/21  4:53 AM  Result Value Ref Range   Troponin I (High Sensitivity) 108 (HH) <18 ng/L    Comment: CRITICAL RESULT CALLED TO, READ BACK BY AND VERIFIED WITH: Vonna Kotyk RN 08/28/21 0639 M KOROLESKI (NOTE) Elevated high sensitivity troponin I (hsTnI) values and significant  changes across serial measurements may suggest ACS but many other  chronic and acute conditions are known to elevate hsTnI results.  Refer to the Links section for chest pain algorithms and additional  guidance. Performed at [facility] Lab, [address] [place], [place] [address]   CBG monitoring, ED     Status: Abnormal   Collection Time: 08/28/21  5:02 AM  Result Value Ref Range   Glucose-Capillary 263 (H) 70 - 99 mg/dL    Comment: Glucose reference range applies only to  samples taken after fasting for at least 8 hours.  CBG monitoring, ED     Status: Abnormal   Collection Time: 08/28/21  6:13 AM  Result Value Ref Range   Glucose-Capillary 251 (H) 70 - 99 mg/dL    Comment: Glucose reference range  applies only to samples taken after fasting for at least 8 hours.  CBG monitoring, ED     Status: Abnormal   Collection Time: 08/28/21  6:57 AM  Result Value Ref Range   Glucose-Capillary 229 (H) 70 - 99 mg/dL    Comment: Glucose reference range applies only to samples taken after fasting for at least 8 hours.  CBG monitoring, ED     Status: Abnormal   Collection Time: 08/28/21  7:56 AM  Result Value Ref Range   Glucose-Capillary 283 (H) 70 - 99 mg/dL    Comment: Glucose reference range applies only to samples taken after fasting for at least 8 hours.  Troponin I (High Sensitivity)     Status: Abnormal   Collection Time: 08/28/21  8:14 AM  Result Value Ref Range   Troponin I (High Sensitivity) 98 (H) <18 ng/L    Comment: (NOTE) Elevated high sensitivity troponin I (hsTnI) values and significant  changes across serial measurements may suggest ACS but many other  chronic and acute conditions are known to elevate hsTnI results.  Refer to the "Links" section for chest pain algorithms and additional  guidance. Performed at [facility], [address] [place], [place] [address]    DG Chest Port 1 View  Result Date: 08/27/2021 CLINICAL DATA:  Shortness of breath. EXAM: PORTABLE CHEST 1 VIEW COMPARISON:  January 15, 2020 FINDINGS: Multiple sternal wires are noted. Moderate severity diffusely increased interstitial lung markings are seen. This is increased in severity when compared to the prior study. There is no evidence of a pleural effusion or pneumothorax. The cardiac silhouette is mildly enlarged. The visualized skeletal structures are unremarkable. IMPRESSION: Findings consistent with moderate severity interstitial edema. Electronically Signed   By: Aram Candela M.D.   On: 08/27/2021 23:29    Pending Labs Unresulted Labs (From admission, onward)    None       Vitals/Pain Today's Vitals   08/28/21 0700 08/28/21 0723 08/28/21 0849 08/28/21 1115  BP: 129/60  127/72  119/74  Pulse: 85  84 83  Resp: Temp:      TempSrc:      SpO2: 96%  100% 93%  Weight:      Height:      PainSc:  0-No pain      Isolation Precautions No active isolations  Medications Medications  aspirin EC tablet 81 mg (81 mg Oral Given 08/28/21 1009)  atorvastatin (LIPITOR) tablet 80 mg (has no administration in time range)  rivaroxaban (XARELTO) tablet 20 mg (has no administration in time range)  acetaminophen (TYLENOL) tablet 650 mg (has no administration in time range)    Or  acetaminophen (TYLENOL) suppository 650 mg (has no administration in time range)  ipratropium-albuterol (DUONEB) 0.5-2.5 (3) MG/3ML nebulizer solution 3 mL (3 mLs Nebulization Given 08/28/21 0845)  doxycycline (VIBRA-TABS) tablet 100 mg (100 mg Oral Given 08/28/21 0509)  insulin aspart (novoLOG) injection 0-15 Units (8 Units Subcutaneous Given 08/28/21 0848)  insulin glargine-yfgn (SEMGLEE) injection 20 Units (20 Units Subcutaneous Given 08/28/21 1009)  furosemide (LASIX) injection 40 mg (40 mg Intravenous Given 08/28/21 0849)  furosemide (LASIX) injection 40 mg (40 mg Intravenous Given 08/28/21 0002)  insulin aspart (novoLOG) injection 15 Units (15 Units Intravenous Given 08/28/21 0003)    Mobility walks with person assist Low fall risk   Focused Assessments    R Recommendations: See Admitting Provider Note  Report given to:   Additional Notes:

## 2021-08-28 NOTE — Progress Notes (Signed)
Inpatient Diabetes Program Recommendations  AACE/ADA: New Consensus Statement on Inpatient Glycemic Control (2015)  Target Ranges:  Prepandial:   less than 140 mg/dL      Peak postprandial:   less than 180 mg/dL (1-2 hours)      Critically ill patients:  140 - 180 mg/dL   Lab Results  Component Value Date   GLUCAP 449 (H) 08/28/2021   HGBA1C 13.3 (H) 08/28/2021    Review of Glycemic Control Results for Robert Sanchez, Robert Sanchez (MRN [id number]) as of 08/28/2021 14:55  Ref. Range 08/28/2021 05:02 08/28/2021 06:13 08/28/2021 06:57 08/28/2021 07:56 08/28/2021 11:51  Glucose-Capillary Latest Ref Range: 70 - 99 mg/dL 076 (H) 226 (H) 333 (H) 283 (H) 449 (H)   Diabetes history: DM 2 Outpatient Diabetes medications:  Glucotrol 10 mg bid, Lantus 10 units daily (patient never took) Metformin 1000 mg bid Current orders for Inpatient glycemic control:  Novolog resistant tid with meals Novolog 8 units tid with meals Semglee 20 units daily Inpatient Diabetes Program Recommendations:    Spoke with patient regarding A1C of 13.3% and average  glucose of 318 mg/dL. Reminded him of goal A1C of 7% and average blood sugar of 150 mg/dL.  Patient states that his blood sugars have been in the 300's for 20 years.  We discussed the importance of glucose control for his body. Patient states "I know".  He states that he did not get prescription filled and does not want to go back to [facility].  He states that he wants to get his insulin at [facility].  Discussed that he can get a box of 5 pens from [facility] for 42$- 70/30.  Told patient this this type of insulin requires him to take with his meals (breakfast and supper).    May consider switching patient to 70/30 20 units bid (start on 08/29/21)? If 70/30 started, d/c lantus and novolog meal coverage.  Will likely need too titrate while in the hospital.  At d/c consider Novolin Relion 70/30 insulin pens (will need insulin pen needles as well).  Patient was able to demonstrate  insulin pen use to me and states he is agreeable.    Thanks,  Beryl Meager, RN, BC-ADM Inpatient Diabetes Coordinator Pager [phone]  (8a-5p)

## 2021-08-28 NOTE — Plan of Care (Signed)

## 2021-08-28 NOTE — ED Notes (Addendum)
Dr. Beacher May informed of patient's troponin of 108 via epic secure chat. No new verbal orders received.

## 2021-08-28 NOTE — Progress Notes (Addendum)
Pt with BGL over 400 at lunch, got 25u novolog.  Despite this BGL over 400 at dinner.  BGL currently 441.  Stat BMP ordered.  Will get pt on insulin gtt to try and bring under control.  IVF at [facility] rate only to try and minimize IVF in this fluid overloaded CHF patient.

## 2021-08-28 NOTE — ED Notes (Signed)
Audie Box sister [phone] requesting an update on the patient

## 2021-08-28 NOTE — H&P (Signed)
History and Physical    Robert Sanchez YWV:[id number] DOB: 1956/09/10 DOA: 08/27/2021  PCP: Hoy Register, MD Patient coming from: Home  Chief Complaint: Respiratory distress  HPI: Robert Sanchez is a 65 y.o. male with medical history significant of CAD status post CABG x3, chronic combined CHF, COPD, insulin-dependent type 2 diabetes, neuropathy, GERD, hypertension, hyperlipidemia, PVD status post thrombectomy/angioplasty on aspirin and Xarelto, obesity (BMI 34.21) presented to the ED via EMS for evaluation of respiratory distress.  Oxygen saturation in the 70s on room air with EMS and was initially placed on CPAP and was given albuterol, Atrovent, IV Solu-Medrol 125 mg, epinephrine, IV magnesium 2 g, amiodarone, and 1 sublingual nitroglycerin.  Upon arrival to the ED, patient was tachycardic and tachypneic.  Taken off of CPAP and placed on nonrebreather.  Labs showing WBC 21.6, hemoglobin 13.3, platelet count 508k.  Sodium 130, potassium 4.9, chloride 97, bicarb 19, anion gap 14, BUN 17, creatinine 1.4 (baseline 1.0), glucose 563.  High-sensitivity troponin 32.  BNP 408.  COVID and influenza PCR negative.  UA with >500 glucose, 5 ketones, 100 protein; no signs of infection.  Chest x-ray showing findings consistent with moderate severity interstitial edema. Patient was given IV Lasix 40 mg, NovoLog 15 units, and subsequently started on IV insulin.  Patient states last few nights he has been waking up feeling very short of breath.  Last night his breathing was significantly worse and he had to call EMS.  He is not coughing much and not wheezing.  Denies chest pain.  He is not sure whether he takes Lasix at home as he is having trouble keeping track of his medications.  He likes eating fast food and does not watch his dietary sodium intake.  Also drinks a lot of water sometimes and does not watch his fluid intake.  States his primary care doctor was concerned that his blood sugar was high and  prescribed insulin but he has not used it.  He is constipated and having hard bowel movements.  Denies abdominal pain, nausea, or vomiting.  States his breathing is much better now and he feels comfortable.  Review of Systems:  All systems reviewed and apart from history of presenting illness, are negative.  Past Medical History:  Diagnosis Date   Acute pulmonary edema (HCC) 06/05/2018   Arthritis    COPD (chronic obstructive pulmonary disease) (HCC)    " beginning stages " per pt   Coronary artery disease    Diabetes mellitus without complication (HCC)    type 2   Diabetic neuropathy (HCC)    Diabetic neuropathy (HCC)    Emphysema/COPD (HCC)    " beginning stages"   GERD (gastroesophageal reflux disease)    HLD (hyperlipidemia)    HOH (hard of hearing)    no hearing aids   Myocardial infarction [facility])    Peripheral vascular disease (HCC)     Past Surgical History:  Procedure Laterality Date   ABDOMINAL AORTOGRAM W/LOWER EXTREMITY N/A 06/21/2018   Procedure: ABDOMINAL AORTOGRAM W/LOWER EXTREMITY;  Surgeon: Cephus Shelling, MD;  Location: [facility] INVASIVE CV LAB;  Service: Cardiovascular;  Laterality: N/A;   ABDOMINAL AORTOGRAM W/LOWER EXTREMITY Left 01/16/2020   Procedure: ABDOMINAL AORTOGRAM W/LOWER EXTREMITY;  Surgeon: Cephus Shelling, MD;  Location: [facility];  Service: Cardiovascular;  Laterality: Left;   ABDOMINAL AORTOGRAM W/LOWER EXTREMITY N/A 07/09/2020   Procedure: ABDOMINAL AORTOGRAM W/LOWER EXTREMITY;  Surgeon: Cephus Shelling, MD;  Location: [facility];  Service:  Cardiovascular;  Laterality: N/A;  TPA infusion   AMPUTATION Left 08/04/2020   Procedure: LEFT PARTIAL GREAT TOE AMPUTATION;  Surgeon: Cephus Shelling, MD;  Location: [facility] OR;  Service: Vascular;  Laterality: Left;   APPLICATION OF WOUND VAC Left 08/04/2020   Procedure: LEFT GROIN DEBRIDEMENT WITH APPLICATION OF WOUND VAC;  Surgeon: Cephus Shelling, MD;  Location: [facility] OR;  Service:  Vascular;  Laterality: Left;   CORONARY ARTERY BYPASS GRAFT N/A 06/12/2018   Procedure: CORONARY ARTERY BYPASS GRAFTING (CABG) x 3 WITH ENDOSCOPIC HARVESTING OF RIGHT GREATER SAPHENOUS VEIN. LIMA TO LAD. SVG TO PD. SVG TO DIAGONAL.;  Surgeon: Kerin Perna, MD;  Location: [facility] OR;  Service: Open Heart Surgery;  Laterality: N/A;   FEMORAL-POPLITEAL BYPASS GRAFT Left 07/10/2020   Procedure: REDO EXPOSURE OF LEFT COMMON FEMORAL ARTERY LEFT COMMON FEMORAL TO POSTERIOR TIBIAL COMPOSITE BYPASS GRAFT;  Surgeon: Cephus Shelling, MD;  Location: [facility] OR;  Service: Vascular;  Laterality: Left;   FEMORAL-TIBIAL BYPASS GRAFT Left 08/21/2018   Procedure: BYPASS GRAFT LEFT FEMORAL TO POSTERIOR TIBIAL ARTERY USING LEFT REVERSED GREAT SAPHENOUS VEIN;  Surgeon: Cephus Shelling, MD;  Location: [facility] OR;  Service: Vascular;  Laterality: Left;   FEMORAL-TIBIAL BYPASS GRAFT Left 08/04/2020   Procedure: REVISION FEMORAL-DISTAL BYPASS WITH BIOLOGICAL BOVINE PATCH;  Surgeon: Cephus Shelling, MD;  Location: [facility] OR;  Service: Vascular;  Laterality: Left;   KNEE ARTHROSCOPY     LOWER EXTREMITY ANGIOGRAM Left 08/04/2020   Procedure: LEFT ILIAC ANGIOGRAM, ANGIOPLASTY OF LEFT ILIAC STENT WITH DRUG COATED BALLOON;  Surgeon: Cephus Shelling, MD;  Location: [facility] OR;  Service: Vascular;  Laterality: Left;   LOWER EXTREMITY ANGIOGRAPHY Left 01/17/2020   Procedure: Lower Extremity Angiography;  Surgeon: Cephus Shelling, MD;  Location: [facility] INVASIVE CV LAB;  Service: Cardiovascular;  Laterality: Left;   LOWER EXTREMITY ANGIOGRAPHY N/A 01/07/2021   Procedure: LOWER EXTREMITY ANGIOGRAPHY;  Surgeon: Leonie Douglas, MD;  Location: [facility];  Service: Cardiovascular;  Laterality: N/A;   PERIPHERAL VASCULAR BALLOON ANGIOPLASTY Left 01/17/2020   Procedure: PERIPHERAL VASCULAR BALLOON ANGIOPLASTY;  Surgeon: Cephus Shelling, MD;  Location: [facility];  Service: Cardiovascular;  Laterality: Left;  pt   PERIPHERAL  VASCULAR INTERVENTION Left 06/21/2018   Procedure: PERIPHERAL VASCULAR INTERVENTION;  Surgeon: Cephus Shelling, MD;  Location: [facility];  Service: Cardiovascular;  Laterality: Left;   PERIPHERAL VASCULAR INTERVENTION Left 01/08/2021   Procedure: PERIPHERAL VASCULAR INTERVENTION;  Surgeon: Cephus Shelling, MD;  Location: [facility];  Service: Cardiovascular;  Laterality: Left;   PERIPHERAL VASCULAR THROMBECTOMY Left 01/16/2020   Procedure: PERIPHERAL VASCULAR THROMBECTOMY;  Surgeon: Cephus Shelling, MD;  Location: [facility];  Service: Cardiovascular;  Laterality: Left;  Lytic Catheter Placement left fem-pop bypass   PERIPHERAL VASCULAR THROMBECTOMY Left 01/17/2020   Procedure: PERIPHERAL VASCULAR THROMBECTOMY;  Surgeon: Cephus Shelling, MD;  Location: [facility];  Service: Cardiovascular;  Laterality: Left;  fem-pt bypass   PERIPHERAL VASCULAR THROMBECTOMY Left 07/10/2020   Procedure: lysis recheck;  Surgeon: Cephus Shelling, MD;  Location: [facility] INVASIVE CV LAB;  Service: Cardiovascular;  Laterality: Left;   PULMONARY THROMBECTOMY N/A 01/08/2021   Procedure: LYSIS RECHECK;  Surgeon: Cephus Shelling, MD;  Location: [facility];  Service: Cardiovascular;  Laterality: N/A;   RIGHT/LEFT HEART CATH AND CORONARY ANGIOGRAPHY N/A 06/05/2018   Procedure: RIGHT/LEFT HEART CATH AND CORONARY ANGIOGRAPHY;  Surgeon: Orpah Cobb, MD;  Location: [facility];  Service: Cardiovascular;  Laterality: N/A;   SPINE SURGERY     TEE WITHOUT CARDIOVERSION N/A 06/12/2018   Procedure: TRANSESOPHAGEAL ECHOCARDIOGRAM (TEE);  Surgeon: Donata Clay, Theron Arista, MD;  Location: [facility] OR;  Service: Open Heart Surgery;  Laterality: N/A;   teeth extractions     THROMBECTOMY FEMORAL ARTERY Left 08/04/2020   Procedure: THROMBECTOMY OF LEFT FEMORAL TP COMPOSTITE BYPASS;  Surgeon: Cephus Shelling, MD;  Location: [facility] OR;  Service: Vascular;  Laterality: Left;   THROMBECTOMY  ILIAC ARTERY Left 07/10/2020   Procedure: THROMBECTOMY OF LEFT EXTERNAL ILIAC AND LEFT COMMON FEMORAL AND LEFT POSTERIOR TIBIAL ARTERIES;  Surgeon: Cephus Shelling, MD;  Location: [facility] OR;  Service: Vascular;  Laterality: Left;   VEIN HARVEST Left 08/21/2018   Procedure: VEIN HARVEST LEFT GREAT SAPHENOUS VEIN;  Surgeon: Cephus Shelling, MD;  Location: [facility] OR;  Service: Vascular;  Laterality: Left;     reports that he quit smoking about 3 years ago. His smoking use included cigarettes. He has a 40.00 pack-year smoking history. He has never used smokeless tobacco. He reports current alcohol use. He reports that he does not currently use drugs after having used the following drugs: Marijuana.  Allergies  Allergen Reactions   Lisinopril Other (See Comments)    Syncope   Codeine Rash    Family History  Problem Relation Age of Onset   Diabetes Mother    Lung disease Mother    Cancer Father    Heart disease Father     Prior to Admission medications   Medication Sig Start Date End Date Taking? Authorizing Provider  aspirin EC 81 MG EC tablet Take 1 tablet (81 mg total) by mouth daily at 6 (six) AM. Swallow whole. 07/12/20   Lars Mage, PA-C  atorvastatin (LIPITOR) 80 MG tablet TAKE 1 TABLET (80 MG TOTAL) BY MOUTH AT BEDTIME. 02/19/21 02/19/22  Hoy Register, MD  carvedilol (COREG) 6.25 MG tablet Take 1 tablet (6.25 mg total) by mouth 2 (two) times daily with a meal. 02/19/21   Hoy Register, MD  furosemide (LASIX) 40 MG tablet Take 1 tablet (40 mg total) by mouth daily. 02/19/21   Hoy Register, MD  glipiZIDE (GLUCOTROL) 10 MG tablet Take 1 tablet (10 mg total) by mouth 2 (two) times daily before a meal. 02/19/21   Newlin, Enobong, MD  insulin glargine (LANTUS SOLOSTAR) 100 UNIT/ML Solostar Pen Inject 10 Units into the skin daily. 02/19/21   Hoy Register, MD  Insulin Pen Needle 31G X 5 MM MISC 1 each at bedtime. 02/19/21   Hoy Register, MD  metFORMIN (GLUCOPHAGE) 1000 MG tablet  Take 1 tablet (1,000 mg total) by mouth 2 (two) times daily with a meal. 02/19/21   Hoy Register, MD  Multiple Vitamin (MULTIVITAMIN) tablet Take 1 tablet by mouth daily.    [provider]  potassium chloride SA (KLOR-CON) 20 MEQ tablet Take 1 tablet (20 mEq total) by mouth daily. 02/19/21   Hoy Register, MD  rivaroxaban (XARELTO) 20 MG TABS tablet TAKE 1 TABLET (20 MG TOTAL) BY MOUTH DAILY WITH SUPPER. 02/10/21 02/10/22  Cephus Shelling, MD  RIVAROXABAN Carlena Hurl) VTE STARTER PACK (15 & 20 MG) FOLLOW PACKAGE DIRECTIONS: TAKE ONE 15MG  TABLET BY MOUTH TWICE A DAY. ON DAY 22, SWITCH TO ONE 20MG  TABLET ONCE A DAY. TAKE WITH FOOD. Patient not taking: Reported on 02/19/2021 01/12/21 01/12/22  Cephus Shelling, MD    Physical Exam: Vitals:   08/27/21 2330  08/28/21 0000 08/28/21 0030 08/28/21 0130  BP: (!) 151/80 (!) 150/87 (!) 141/91 124/64  Pulse: (!) 116 (!) 111 (!) 114 100  Resp: (!) 33 (!) 29 (!) 28 (!) 25  Temp:      TempSrc:      SpO2: 97% 98% 97% 97%  Weight:      Height:        Physical Exam Constitutional:      General: He is not in acute distress. HENT:     Head: Normocephalic and atraumatic.  Neck:     Comments: +JVD Cardiovascular:     Rate and Rhythm: Normal rate and regular rhythm.     Pulses: Normal pulses.  Pulmonary:     Effort: Pulmonary effort is normal. No respiratory distress.     Breath sounds: Rales present. No wheezing.  Abdominal:     General: Bowel sounds are normal. There is distension.     Palpations: Abdomen is soft.     Tenderness: There is no abdominal tenderness. There is no guarding or rebound.  Musculoskeletal:     Cervical back: Normal range of motion and neck supple.     Comments: Trace bilateral pedal edema  Skin:    General: Skin is warm and dry.  Neurological:     General: No focal deficit present.     Mental Status: He is alert and oriented to person, place, and time.     Labs on Admission: I have personally reviewed  following labs and imaging studies  CBC: Recent Labs  Lab 08/27/21 2316 08/27/21 2323 08/28/21 0237  WBC 21.6*  --   --   NEUTROABS 17.6*  --   --   HGB 13.3 14.3 13.3  HCT 40.2 42.0 39.0  MCV 91.8  --   --   PLT 508*  --   --    Basic Metabolic Panel: Recent Labs  Lab 08/27/21 2316 08/27/21 2323 08/28/21 0237  NA 130* 133* 135  K 4.9 4.9 3.7  CL 97* 101  --   CO2 19*  --   --   GLUCOSE 563* 589*  --   BUN 17 19  --   CREATININE 1.41* 1.30*  --   CALCIUM 8.5*  --   --    GFR: Estimated Creatinine Clearance: 67.4 mL/min (A) (by C-G formula based on SCr of 1.3 mg/dL (H)). Liver Function Tests: No results for input(s): AST, ALT, ALKPHOS, BILITOT, PROT, ALBUMIN in the last 168 hours. No results for input(s): LIPASE, AMYLASE in the last 168 hours. No results for input(s): AMMONIA in the last 168 hours. Coagulation Profile: No results for input(s): INR, PROTIME in the last 168 hours. Cardiac Enzymes: No results for input(s): CKTOTAL, CKMB, CKMBINDEX, TROPONINI in the last 168 hours. BNP (last 3 results) No results for input(s): PROBNP in the last 8760 hours. HbA1C: No results for input(s): HGBA1C in the last 72 hours. CBG: Recent Labs  Lab 08/28/21 0135 08/28/21 0242 08/28/21 0308 08/28/21 0351 08/28/21 0409  GLUCAP 452* 413* 382* 345* 314*   Lipid Profile: No results for input(s): CHOL, HDL, LDLCALC, TRIG, CHOLHDL, LDLDIRECT in the last 72 hours. Thyroid Function Tests: No results for input(s): TSH, T4TOTAL, FREET4, T3FREE, THYROIDAB in the last 72 hours. Anemia Panel: No results for input(s): VITAMINB12, FOLATE, FERRITIN, TIBC, IRON, RETICCTPCT in the last 72 hours. Urine analysis:    Component Value Date/Time   COLORURINE STRAW (A) 08/28/2021 0153   APPEARANCEUR CLEAR 08/28/2021 0153   LABSPEC 1.016  08/28/2021 0153   PHURINE 5.0 08/28/2021 0153   GLUCOSEU >=500 (A) 08/28/2021 0153   HGBUR SMALL (A) 08/28/2021 0153   BILIRUBINUR NEGATIVE 08/28/2021  0153   KETONESUR 5 (A) 08/28/2021 0153   PROTEINUR 100 (A) 08/28/2021 0153   NITRITE NEGATIVE 08/28/2021 0153   LEUKOCYTESUR NEGATIVE 08/28/2021 0153    Radiological Exams on Admission: DG Chest Port 1 View  Result Date: 08/27/2021 CLINICAL DATA:  Shortness of breath. EXAM: PORTABLE CHEST 1 VIEW COMPARISON:  January 15, 2020 FINDINGS: Multiple sternal wires are noted. Moderate severity diffusely increased interstitial lung markings are seen. This is increased in severity when compared to the prior study. There is no evidence of a pleural effusion or pneumothorax. The cardiac silhouette is mildly enlarged. The visualized skeletal structures are unremarkable. IMPRESSION: Findings consistent with moderate severity interstitial edema. Electronically Signed   By: Aram Candela M.D.   On: 08/27/2021 23:29    EKG: Independently reviewed.  Sinus tachycardia, PVCs.  Rate increased but otherwise no significant change since prior tracing.  Assessment/Plan Principal Problem:   CHF exacerbation (HCC) Active Problems:   Acute hypoxemic respiratory failure (HCC)   Hyperglycemia   AKI (acute kidney injury) (HCC)   Elevated troponin   Acute hypoxemic respiratory failure secondary to acute on chronic combined CHF and ?COPD exacerbation Likely due to medication noncompliance.  It appears patient is not taking Lasix.  Also not watching his dietary sodium and fluid intake.  Appears volume overloaded on exam with rales and JVD.  BNP elevated at 408. Chest x-ray showing findings consistent with moderate severity interstitial edema. Echo done in August 2019 showing LVEF 20 to 25%, grade 1 diastolic dysfunction, and moderate mitral regurgitation.  Oxygen saturation in the 70s on room air with EMS and initially required CPAP.  Currently satting 100% on nonrebreather and work of breathing has improved significantly.  He is not wheezing. -Patient received IV Lasix 40 mg x 1 in the ED.  Given AKI, repeat labs to  check renal function before ordering additional doses of Lasix.  Continue home Coreg.  He was given IV Solu-Medrol by EMS, will avoid additional steroids at this time given significant hyperglycemia.  DuoNeb every 6 hours.  Start doxycycline given elevated WBC count.  Check procalcitonin level.  Repeat echocardiogram.  Monitor intake and output, daily weights.  Low-sodium diet with fluid restriction.  Continue supplemental oxygen, wean as tolerated.  Hyperglycemia/possible early onset DKA in the setting of poorly controlled insulin-dependent type 2 diabetes A1c 12.7 on 01/06/2021.  Patient was prescribed insulin by his PCP but never started using it.  Not compliant with his oral meds either.  Blood glucose elevated at 563.  Bicarb 19, anion gap 14.  UA with only 5 ketones. -Continue IV insulin.  Keep NPO.  Repeat A1c.  Monitor BMP every 4 hours.  Check beta hydroxybutyric acid level.  AKI Possibly cardiorenal in the setting of decompensated CHF.  Creatinine 1.4, baseline 1.0. -IV Lasix 40 mg x 1 given, repeat BMP to check renal function  Elevated troponin CAD status post CABG x3 High-sensitivity troponin slightly elevated (32 >48).  Suspect troponin elevation is due to demand ischemia in the setting of decompensated CHF.  ACS less likely as EKG without acute ischemic changes and patient is not endorsing chest pain. -Cardiac monitoring, trend troponin  Leukocytosis Possibly reactive.  Patient is not febrile. -Check procalcitonin level and repeat CBC in a.m.  Mild thrombocytosis -Repeat CBC in a.m.  PVD -Continue aspirin and Xarelto  Hypertension Stable. -Continue home Coreg  Hyperlipidemia -Continue home Lipitor  DVT prophylaxis: Xarelto Code Status: Patient wishes to be full code. Family Communication: No family available at this time. Disposition Plan: Status is: Inpatient  Remains inpatient appropriate because: Needs IV diuresis for decompensated CHF.  Level of care: Level of  care: Progressive  The medical decision making on this patient was of high complexity and the patient is at high risk for clinical deterioration, therefore this is a level 3 visit.  John Giovanni MD [facility]  If 7PM-7AM, please contact night-coverage [email or web address]  08/28/2021, 4:15 AM

## 2021-08-28 NOTE — ED Notes (Signed)
Echo at bedside

## 2021-08-29 ENCOUNTER — Inpatient Hospital Stay (HOSPITAL_COMMUNITY): Payer: Self-pay

## 2021-08-29 LAB — BASIC METABOLIC PANEL
Anion gap: 11 (ref 5–15)
BUN: 26 mg/dL — ABNORMAL HIGH (ref 8–23)
CO2: 23 mmol/L (ref 22–32)
Calcium: 8.8 mg/dL — ABNORMAL LOW (ref 8.9–10.3)
Chloride: 104 mmol/L (ref 98–111)
Creatinine, Ser: 1.21 mg/dL (ref 0.61–1.24)
GFR, Estimated: >60 mL/min (ref >60)
Glucose, Bld: 193 mg/dL — ABNORMAL HIGH (ref 70–99)
Potassium: 3.8 mmol/L (ref 3.5–5.1)
Sodium: 138 mmol/L (ref 135–145)

## 2021-08-29 LAB — GLUCOSE, CAPILLARY
Glucose-Capillary: 137 mg/dL — ABNORMAL HIGH (ref 70–99)
Glucose-Capillary: 145 mg/dL — ABNORMAL HIGH (ref 70–99)
Glucose-Capillary: 151 mg/dL — ABNORMAL HIGH (ref 70–99)
Glucose-Capillary: 164 mg/dL — ABNORMAL HIGH (ref 70–99)
Glucose-Capillary: 173 mg/dL — ABNORMAL HIGH (ref 70–99)
Glucose-Capillary: 175 mg/dL — ABNORMAL HIGH (ref 70–99)
Glucose-Capillary: 243 mg/dL — ABNORMAL HIGH (ref 70–99)
Glucose-Capillary: 285 mg/dL — ABNORMAL HIGH (ref 70–99)

## 2021-08-29 LAB — MAGNESIUM: Magnesium: 2.1 mg/dL (ref 1.7–2.4)

## 2021-08-29 MED ORDER — INSULIN GLARGINE-YFGN 100 UNIT/ML ~~LOC~~ SOLN: 40.0000 [IU] | Freq: Every day | SUBCUTANEOUS | Status: DC

## 2021-08-29 MED ORDER — CARVEDILOL 6.25 MG PO TABS
6.2500 mg | ORAL_TABLET | Freq: Two times a day (BID) | ORAL | Status: DC
Start: 2021-08-29 — End: 2021-08-31
  Administered 2021-08-29 – 2021-08-31 (×4): 6.25 mg via ORAL
  Filled 2021-08-29 (×4): qty 1

## 2021-08-29 MED ORDER — INSULIN ASPART 100 UNIT/ML IJ SOLN
16.0000 [IU] | Freq: Three times a day (TID) | INTRAMUSCULAR | Status: DC
  Administered 2021-08-29 (×2): 16 [IU] via SUBCUTANEOUS

## 2021-08-29 MED ORDER — POTASSIUM CHLORIDE CRYS ER 20 MEQ PO TBCR
20.0000 meq | EXTENDED_RELEASE_TABLET | Freq: Every day | ORAL | Status: DC
  Administered 2021-08-29 – 2021-08-31 (×3): 20 meq via ORAL
  Filled 2021-08-29 (×3): qty 1

## 2021-08-29 MED ORDER — CARVEDILOL 3.125 MG PO TABS: 3.1250 mg | ORAL_TABLET | Freq: Two times a day (BID) | ORAL | Status: DC

## 2021-08-29 MED ORDER — INSULIN GLARGINE-YFGN 100 UNIT/ML ~~LOC~~ SOLN
35.0000 [IU] | Freq: Every day | SUBCUTANEOUS | Status: DC
  Administered 2021-08-29: 35 [IU] via SUBCUTANEOUS
  Filled 2021-08-29 (×2): qty 0.35

## 2021-08-29 MED ORDER — INSULIN ASPART 100 UNIT/ML IJ SOLN
0.0000 [IU] | Freq: Three times a day (TID) | INTRAMUSCULAR | Status: DC
  Administered 2021-08-29: 11 [IU] via SUBCUTANEOUS
  Administered 2021-08-29: 4 [IU] via SUBCUTANEOUS
  Administered 2021-08-29 – 2021-08-30 (×2): 7 [IU] via SUBCUTANEOUS
  Administered 2021-08-30: 4 [IU] via SUBCUTANEOUS
  Administered 2021-08-31 (×2): 3 [IU] via SUBCUTANEOUS

## 2021-08-29 MED ORDER — INSULIN ASPART 100 UNIT/ML IJ SOLN: 14.0000 [IU] | Freq: Three times a day (TID) | INTRAMUSCULAR | Status: DC

## 2021-08-29 MED ORDER — MAGNESIUM OXIDE -MG SUPPLEMENT 400 (240 MG) MG PO TABS
400.0000 mg | ORAL_TABLET | Freq: Every day | ORAL | Status: DC
  Administered 2021-08-29 – 2021-08-31 (×3): 400 mg via ORAL
  Filled 2021-08-29 (×3): qty 1

## 2021-08-29 NOTE — Progress Notes (Signed)
PROGRESS NOTE        PATIENT DETAILS Name: Robert Sanchez Age: 65 y.o. Sex: male Date of Birth: [birth year] Admit Date: 08/27/2021 Admitting Physician John Giovanni, MD XQJ:JHERDE, Odette Horns, MD  Brief Narrative: Patient is a 65 y.o. male with history of CAD s/p CABG, DM-2, HFrEF, PAD, medication noncompliance presented with acute hypoxic respiratory failure (initially on CPAP/NRB) due to decompensated systolic heart failure.  See below for further details.  Subjective: Feels much better-shortness of breath has markedly improved per patient.  Objective: Vitals: Blood pressure (!) 124/57, pulse 90, temperature 98 F (36.7 C), temperature source Oral, resp. rate 18, height 5\' 8"  (1.727 m), weight 94.5 kg, SpO2 97 %.   Exam: Gen Exam:Alert awake-not in any distress HEENT:atraumatic, normocephalic Chest: Few bibasilar rales. CVS:S1S2 regular Abdomen:soft non tender, non distended Extremities:no edema Neurology: Non focal Skin: no rash   Pertinent Labs/Radiology: Creatinine: 1.21  10/27>>CXR: Bilateral interstitial edema 10/28>> Echo: EF 35-40%, global hypokinesis.  RV systolic function moderately reduced.  10/29>> CXR: Decreasing interstitial edema  Assessment/Plan: Acute hypoxic respiratory failure due to HFrEF with exacerbation: Significant improvement-attempt to titrate down oxygen-Marked improvement in volume status-continues to have some bibasilar rales-continue with IV Lasix for 1 more day-since BP more stable today-restart beta-blocker.  Reassess on 10/30 to see if we can add Entresto.    DM-2 with uncontrolled hyperglycemia (A1c 13.3 on 10/28): CBGs still uncontrolled-increased Lantus to 35 units daily, increased Premeal NovoLog to 14 units-on SSI.  Once CBGs have stabilized-we can contemplate switching to insulin 70/30-as patient without insurance and has financial issues.   AKI: Mild-hemodynamically mediated-improved.  Leukocytosis:  Reactive-no indication of infection-no longer on doxycycline.  Minimally elevated troponin: Insignificant-due to demand ischemia-no indication of ACS.  History of CAD s/p CABG: No chest pain-SOB from decompensated heart failure.  History of PAD: Has had several vascular procedures in lower extremities-remains on Xarelto/aspirin.  HTN: BP better-restarting Coreg.  HLD: Continue Lipitor  Noncompliance with medication/follow-up: Counseled  Obesity: Estimated body mass index is 31.67 kg/m as calculated from the following:   Height as of this encounter: 5\' 8"  (1.727 m).   Weight as of this encounter: 94.5 kg.   Procedures: None Consults: None DVT Prophylaxis: Xarelto Code Status:Full code  Family Communication: None at bedside-he does not wish for me to talk to his sister or other family members (ED RN present at bedside)  Time spent: 35 minutes-Greater than 50% of this time was spent in counseling, explanation of diagnosis, planning of further management, and coordination of care.  Diet: Diet Order             Diet heart healthy/carb modified Room service appropriate? Yes; Fluid consistency: Thin  Diet effective now                      Disposition Plan: Status is: Inpatient  Remains inpatient appropriate because: Severe hypoxia due to decompensated heart failure-on IV Lasix.  Not yet stable for discharge.   Antimicrobial agents: Anti-infectives (From admission, onward)    Start     Dose/Rate Route Frequency Ordered Stop   08/28/21 0415  doxycycline (VIBRA-TABS) tablet 100 mg  Status:  Discontinued        100 mg Oral Every 12 hours 08/28/21 0409 08/28/21 1219        MEDICATIONS: Scheduled Meds:  aspirin EC  81 mg Oral Daily   atorvastatin  80 mg Oral QHS   carvedilol  6.25 mg Oral BID WC   furosemide  40 mg Intravenous BID   [START ON 08/31/2021] influenza vac split quadrivalent PF  0.5 mL Intramuscular Tomorrow-1000   insulin aspart  0-20 Units  Subcutaneous TID WC   insulin aspart  16 Units Subcutaneous TID WC   insulin glargine-yfgn  35 Units Subcutaneous Daily   ipratropium-albuterol  3 mL Nebulization Q6H   magnesium oxide  400 mg Oral Daily   potassium chloride  20 mEq Oral Daily   rivaroxaban  20 mg Oral Q supper   Continuous Infusions: PRN Meds:.acetaminophen **OR** acetaminophen, albuterol   I have personally reviewed following labs and imaging studies  LABORATORY DATA: CBC: Recent Labs  Lab 08/27/21 2316 08/27/21 2323 08/28/21 0237 08/28/21 0453  WBC 21.6*  --   --  12.1*  NEUTROABS 17.6*  --   --   --   HGB 13.3 14.3 13.3 12.5*  HCT 40.2 42.0 39.0 37.8*  MCV 91.8  --   --  89.8  PLT 508*  --   --  389     Basic Metabolic Panel: Recent Labs  Lab 08/27/21 2316 08/27/21 2323 08/28/21 0237 08/28/21 0453 08/28/21 1944 08/29/21 0406  NA 130* 133* 135 133* 133* 138  K 4.9 4.9 3.7 3.6 3.9 3.8  CL 97* 101  --  102 100 104  CO2 19*  --   --  19* 24 23  GLUCOSE 563* 589*  --  291* 432* 193*  BUN 17 19  --  20 26* 26*  CREATININE 1.41* 1.30*  --  1.34* 1.34* 1.21  CALCIUM 8.5*  --   --  8.8* 8.8* 8.8*  MG  --   --   --   --   --  2.1     GFR: Estimated Creatinine Clearance: 69.6 mL/min (by C-G formula based on SCr of 1.21 mg/dL).  Liver Function Tests: No results for input(s): AST, ALT, ALKPHOS, BILITOT, PROT, ALBUMIN in the last 168 hours. No results for input(s): LIPASE, AMYLASE in the last 168 hours. No results for input(s): AMMONIA in the last 168 hours.  Coagulation Profile: No results for input(s): INR, PROTIME in the last 168 hours.  Cardiac Enzymes: No results for input(s): CKTOTAL, CKMB, CKMBINDEX, TROPONINI in the last 168 hours.  BNP (last 3 results) No results for input(s): PROBNP in the last 8760 hours.  Lipid Profile: No results for input(s): CHOL, HDL, LDLCALC, TRIG, CHOLHDL, LDLDIRECT in the last 72 hours.  Thyroid Function Tests: No results for input(s): TSH, T4TOTAL,  FREET4, T3FREE, THYROIDAB in the last 72 hours.  Anemia Panel: No results for input(s): VITAMINB12, FOLATE, FERRITIN, TIBC, IRON, RETICCTPCT in the last 72 hours.  Urine analysis:    Component Value Date/Time   COLORURINE STRAW (A) 08/28/2021 0153   APPEARANCEUR CLEAR 08/28/2021 0153   LABSPEC 1.016 08/28/2021 0153   PHURINE 5.0 08/28/2021 0153   GLUCOSEU >=500 (A) 08/28/2021 0153   HGBUR SMALL (A) 08/28/2021 0153   BILIRUBINUR NEGATIVE 08/28/2021 0153   KETONESUR 5 (A) 08/28/2021 0153   PROTEINUR 100 (A) 08/28/2021 0153   NITRITE NEGATIVE 08/28/2021 0153   LEUKOCYTESUR NEGATIVE 08/28/2021 0153    Sepsis Labs: Lactic Acid, Venous    Component Value Date/Time   LATICACIDVEN 2.8 (HH) 07/31/2020 1730    MICROBIOLOGY: Recent Results (from the past 240 hour(s))  Resp Panel by RT-PCR (Flu A&B, Covid) Nasopharyngeal  Swab     Status: None   Collection Time: 08/28/21 12:44 AM   Specimen: Nasopharyngeal Swab; Nasopharyngeal(NP) swabs in vial transport medium  Result Value Ref Range Status   SARS Coronavirus 2 by RT PCR NEGATIVE NEGATIVE Final    Comment: (NOTE) SARS-CoV-2 target nucleic acids are NOT DETECTED.  The SARS-CoV-2 RNA is generally detectable in upper respiratory specimens during the acute phase of infection. The lowest concentration of SARS-CoV-2 viral copies this assay can detect is 138 copies/mL. A negative result does not preclude SARS-Cov-2 infection and should not be used as the sole basis for treatment or other patient management decisions. A negative result may occur with  improper specimen collection/handling, submission of specimen other than nasopharyngeal swab, presence of viral mutation(s) within the areas targeted by this assay, and inadequate number of viral copies(<138 copies/mL). A negative result must be combined with clinical observations, patient history, and epidemiological information. The expected result is Negative.  Fact Sheet for Patients:   BloggerCourse.com  Fact Sheet for Healthcare Providers:  SeriousBroker.it  This test is no t yet approved or cleared by the [place] FDA and  has been authorized for detection and/or diagnosis of SARS-CoV-2 by FDA under an Emergency Use Authorization (EUA). This EUA will remain  in effect (meaning this test can be used) for the duration of the COVID-19 declaration under Section 564(b)(1) of the Act, 21 U.S.C.section 360bbb-3(b)(1), unless the authorization is terminated  or revoked sooner.       Influenza A by PCR NEGATIVE NEGATIVE Final   Influenza B by PCR NEGATIVE NEGATIVE Final    Comment: (NOTE) The Xpert Xpress SARS-CoV-2/FLU/RSV plus assay is intended as an aid in the diagnosis of influenza from Nasopharyngeal swab specimens and should not be used as a sole basis for treatment. Nasal washings and aspirates are unacceptable for Xpert Xpress SARS-CoV-2/FLU/RSV testing.  Fact Sheet for Patients: BloggerCourse.com  Fact Sheet for Healthcare Providers: SeriousBroker.it  This test is not yet approved or cleared by the [place] FDA and has been authorized for detection and/or diagnosis of SARS-CoV-2 by FDA under an Emergency Use Authorization (EUA). This EUA will remain in effect (meaning this test can be used) for the duration of the COVID-19 declaration under Section 564(b)(1) of the Act, 21 U.S.C. section 360bbb-3(b)(1), unless the authorization is terminated or revoked.  Performed at [facility], [address] [place], [place] [address]     RADIOLOGY STUDIES/RESULTS: DG Chest Port 1 View  Result Date: 08/29/2021 CLINICAL DATA:  Shortness of breath EXAM: PORTABLE CHEST 1 VIEW COMPARISON:  Previous studies including the examination of 08/27/2021 FINDINGS: Transverse diameter of heart is increased. There is evidence of previous coronary bypass surgery.  There is interval decrease in interstitial markings in both lungs. There is no new focal pulmonary infiltrate. There is no significant pleural effusion or pneumothorax. IMPRESSION: There is interval decrease in interstitial markings in both lungs suggesting resolving pulmonary edema. No new focal pulmonary infiltrates are seen. Reading location: [place], [place]. Electronically Signed   By: Ernie Avena M.D.   On: 08/29/2021 08:01   DG Chest Port 1 View  Result Date: 08/27/2021 CLINICAL DATA:  Shortness of breath. EXAM: PORTABLE CHEST 1 VIEW COMPARISON:  January 15, 2020 FINDINGS: Multiple sternal wires are noted. Moderate severity diffusely increased interstitial lung markings are seen. This is increased in severity when compared to the prior study. There is no evidence of a pleural effusion or pneumothorax. The cardiac silhouette is mildly  enlarged. The visualized skeletal structures are unremarkable. IMPRESSION: Findings consistent with moderate severity interstitial edema. Electronically Signed   By: Aram Candela M.D.   On: 08/27/2021 23:29   ECHOCARDIOGRAM COMPLETE  Result Date: 08/28/2021    ECHOCARDIOGRAM REPORT   Patient Name:   Robert Sanchez Date of Exam: 08/28/2021 Medical Rec #:  [id number]        Height:       68.0 in Accession #:    [id number]       Weight:       225.0 lb Date of Birth:  [birth year]        BSA:          2.149 m Patient Age:    65 years         BP:           129/69 mmHg Patient Gender: M                HR:           84 bpm. Exam Location:  Inpatient Procedure: 2D Echo, Intracardiac Opacification Agent, Color Doppler and Cardiac            Doppler Indications:    CHF  History:        Patient has prior history of Echocardiogram examinations, most                 recent 06/05/2018. CAD, Prior CABG; Risk Factors:Hypertension and                 Diabetes.  Sonographer:    Cleatis Polka Referring Phys: [id number] VASUNDHRA RATHORE IMPRESSIONS  1. Left ventricular ejection  fraction, by estimation, is 35 to 40%. The left ventricle has moderately decreased function. The left ventricle demonstrates global hypokinesis. The left ventricular internal cavity size was moderately dilated. Left ventricular diastolic parameters are consistent with Grade II diastolic dysfunction (pseudonormalization). Elevated left ventricular end-diastolic pressure.  2. Right ventricular systolic function is moderately reduced. The right ventricular size is mildly enlarged.  3. Left atrial size was mildly dilated.  4. Right atrial size was mildly dilated.  5. The mitral valve is grossly normal. Trivial mitral valve regurgitation.  6. The aortic valve is tricuspid. Aortic valve regurgitation is not visualized. FINDINGS  Left Ventricle: Left ventricular ejection fraction, by estimation, is 35 to 40%. The left ventricle has moderately decreased function. The left ventricle demonstrates global hypokinesis. The left ventricular internal cavity size was moderately dilated. There is borderline left ventricular hypertrophy. Left ventricular diastolic parameters are consistent with Grade II diastolic dysfunction (pseudonormalization). Elevated left ventricular end-diastolic pressure. Right Ventricle: The right ventricular size is mildly enlarged. Right vetricular wall thickness was not well visualized. Right ventricular systolic function is moderately reduced. Left Atrium: Left atrial size was mildly dilated. Right Atrium: Right atrial size was mildly dilated. Pericardium: There is no evidence of pericardial effusion. Mitral Valve: The mitral valve is grossly normal. Trivial mitral valve regurgitation. Tricuspid Valve: The tricuspid valve is normal in structure. Tricuspid valve regurgitation is not demonstrated. Aortic Valve: The aortic valve is tricuspid. Aortic valve regurgitation is not visualized. Aortic valve peak gradient measures 7.0 mmHg. Pulmonic Valve: The pulmonic valve was normal in structure. Pulmonic valve  regurgitation is not visualized. Aorta: The aortic root and ascending aorta are structurally normal, with no evidence of dilitation. IAS/Shunts: The atrial septum is grossly normal.  LEFT VENTRICLE PLAX 2D LVIDd:         5.80 cm  Diastology LVIDs:         4.90 cm      LV e' medial:    6.09 cm/s LV PW:         1.10 cm      LV E/e' medial:  20.9 LV IVS:        1.10 cm      LV e' lateral:   7.51 cm/s LVOT diam:     2.00 cm      LV E/e' lateral: 16.9 LV SV:         66 LV SV Index:   30 LVOT Area:     3.14 cm  LV Volumes (MOD) LV vol d, MOD A2C: 202.0 ml LV vol d, MOD A4C: 199.0 ml LV vol s, MOD A2C: 136.0 ml LV vol s, MOD A4C: 126.0 ml LV SV MOD A2C:     66.0 ml LV SV MOD A4C:     199.0 ml LV SV MOD BP:      72.4 ml RIGHT VENTRICLE            IVC RV Basal diam:  4.00 cm    IVC diam: 2.40 cm RV Mid diam:    2.40 cm RV S prime:     7.29 cm/s TAPSE (M-mode): 1.3 cm LEFT ATRIUM             Index        RIGHT ATRIUM           Index LA diam:        4.90 cm 2.28 cm/m   RA Area:     18.40 cm LA Vol (A2C):   71.4 ml 33.23 ml/m  RA Volume:   50.20 ml  23.36 ml/m LA Vol (A4C):   83.4 ml 38.82 ml/m LA Biplane Vol: 77.9 ml 36.26 ml/m  AORTIC VALVE AV Area (Vmax): 2.51 cm AV Vmax:        132.50 cm/s AV Peak Grad:   7.0 mmHg LVOT Vmax:      106.00 cm/s LVOT Vmean:     75.750 cm/s LVOT VTI:       0.209 m  AORTA Ao Root diam: 3.10 cm Ao Asc diam:  3.10 cm MITRAL VALVE MV Area (PHT): 3.99 cm     SHUNTS MV Decel Time: 190 msec     Systemic VTI:  0.21 m MV E velocity: 127.00 cm/s  Systemic Diam: 2.00 cm MV A velocity: 97.40 cm/s MV E/A ratio:  1.30 Kristeen Miss MD Electronically signed by Kristeen Miss MD Signature Date/Time: 08/28/2021/3:41:16 PM    Final      LOS: 1 day   Jeoffrey Massed, MD  [facility]    To contact the attending provider between 7A-7P or the covering provider during after hours 7P-7A, please log into the web site [email or web address] and access using universal [place] password for that web  site. If you do not have the password, please call the hospital operator.  08/29/2021, 11:34 AM

## 2021-08-29 NOTE — Plan of Care (Signed)

## 2021-08-29 NOTE — Progress Notes (Addendum)
CBG now 145, insulin gtt rate 1.8 u/hr.  Still has 20u lantus onboard from yesterday AM.  Will put on Lantus 35u daily, first dose now; Suspect pts ultimate basal requirement going to be in the range of 50-60u/day (based on 1.8 basal rate on the drip + 20u lantus still in system from yesterday at 10am).  Will put back on 14u mealtime coverage.  Will resume resistant scale SSI AC.  No HS coverage.  CBG checks AC/HS and 0300.

## 2021-08-30 LAB — GLUCOSE, CAPILLARY
Glucose-Capillary: 151 mg/dL — ABNORMAL HIGH (ref 70–99)
Glucose-Capillary: 154 mg/dL — ABNORMAL HIGH (ref 70–99)
Glucose-Capillary: 204 mg/dL — ABNORMAL HIGH (ref 70–99)
Glucose-Capillary: 219 mg/dL — ABNORMAL HIGH (ref 70–99)
Glucose-Capillary: 236 mg/dL — ABNORMAL HIGH (ref 70–99)
Glucose-Capillary: 81 mg/dL (ref 70–99)

## 2021-08-30 LAB — BASIC METABOLIC PANEL
Anion gap: 7 (ref 5–15)
BUN: 25 mg/dL — ABNORMAL HIGH (ref 8–23)
CO2: 25 mmol/L (ref 22–32)
Calcium: 8.8 mg/dL — ABNORMAL LOW (ref 8.9–10.3)
Chloride: 103 mmol/L (ref 98–111)
Creatinine, Ser: 1.22 mg/dL (ref 0.61–1.24)
GFR, Estimated: >60 mL/min (ref >60)
Glucose, Bld: 228 mg/dL — ABNORMAL HIGH (ref 70–99)
Potassium: 4.2 mmol/L (ref 3.5–5.1)
Sodium: 135 mmol/L (ref 135–145)

## 2021-08-30 MED ORDER — INSULIN ASPART PROT & ASPART (70-30 MIX) 100 UNIT/ML ~~LOC~~ SUSP
30.0000 [IU] | Freq: Every day | SUBCUTANEOUS | Status: DC
  Administered 2021-08-30: 30 [IU] via SUBCUTANEOUS

## 2021-08-30 MED ORDER — INSULIN ASPART 100 UNIT/ML IJ SOLN
3.0000 [IU] | Freq: Once | INTRAMUSCULAR | Status: AC
  Administered 2021-08-31: 3 [IU] via SUBCUTANEOUS

## 2021-08-30 MED ORDER — FUROSEMIDE 40 MG PO TABS
40.0000 mg | ORAL_TABLET | Freq: Every day | ORAL | Status: DC
  Administered 2021-08-30 – 2021-08-31 (×2): 40 mg via ORAL
  Filled 2021-08-30 (×2): qty 1

## 2021-08-30 MED ORDER — INSULIN ASPART PROT & ASPART (70-30 MIX) 100 UNIT/ML ~~LOC~~ SUSP: 20.0000 [IU] | Freq: Every day | SUBCUTANEOUS | Status: DC

## 2021-08-30 MED ORDER — INSULIN ASPART PROT & ASPART (70-30 MIX) 100 UNIT/ML ~~LOC~~ SUSP: 50.0000 [IU] | Freq: Every day | SUBCUTANEOUS | Status: DC

## 2021-08-30 MED ORDER — INSULIN ASPART PROT & ASPART (70-30 MIX) 100 UNIT/ML ~~LOC~~ SUSP
45.0000 [IU] | Freq: Every day | SUBCUTANEOUS | Status: DC
  Administered 2021-08-30 – 2021-08-31 (×2): 45 [IU] via SUBCUTANEOUS
  Filled 2021-08-30: qty 10

## 2021-08-30 MED ORDER — FUROSEMIDE 10 MG/ML IJ SOLN: 40.0000 mg | Freq: Two times a day (BID) | INTRAMUSCULAR | Status: DC

## 2021-08-30 NOTE — Evaluation (Signed)
Physical Therapy Evaluation Patient Details Name: ASIA FAVATA MRN: [id number] DOB: 1955-12-11 Today's Date: 08/30/2021  History of Present Illness  Pt is a 65 y/o male admitted secondary to respiratory distress, found to have acute hypoxemic respiratory failure secondary to acute on chronic combined CHF and ?COPD exacerbation. PMH including but not limited to CAD status post CABG x3, chronic combined CHF, COPD, insulin-dependent type 2 diabetes, neuropathy, GERD, hypertension, hyperlipidemia, PVD status post thrombectomy/angioplasty on aspirin and Xarelto and obesity.   Clinical Impression   Pt presented sitting upright in bed at EOB, awake and willing to participate in therapy session. Prior to admission, pt reported that he was independent with all functional mobility and ADLs. 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 Additionally, he stated that he lives alone in a single level home with a level entry. At the time of evaluation, pt very reluctant to even participate but was willing to ambulate a short distance in his room. His lunch also came during the session and he was very interested in eating his food and then wanting to take a shower despite being told by staff that he was not allowed to. Pt was able to perform transfers independently and ambulate with supervision for safety only. He appears to be at his baseline in regards to functional mobility. PT will continue to f/u with pt acutely to progress mobility as tolerated and to ensure a safe d/c home when medically appropriate.      Recommendations for follow up therapy are one component of a multi-disciplinary discharge planning process, led by the attending physician.  Recommendations may be updated based on patient status, additional functional criteria and insurance authorization.  Follow Up Recommendations No PT follow up    Assistance Recommended at Discharge PRN  Functional Status Assessment Patient has not had a recent decline  in their functional status  Equipment Recommendations  None recommended by PT    Recommendations for Other Services       Precautions / Restrictions Precautions Precautions: Fall Restrictions Weight Bearing Restrictions: No      Mobility  Bed Mobility Overal bed mobility: Independent                  Transfers Overall transfer level: Independent Equipment used: None                    Ambulation/Gait Ambulation/Gait assistance: Supervision Gait Distance (Feet): 30 Feet Assistive device: None Gait Pattern/deviations: Step-through pattern;Decreased stride length     General Gait Details: pt only agreeable to ambulate a short distance within his room; he demonstrated no instability or LOB without needing any AD  Stairs            Wheelchair Mobility    Modified Rankin (Stroke Patients Only)       Balance Overall balance assessment: Needs assistance Sitting-balance support: Feet supported;No upper extremity supported Sitting balance-Leahy Scale: Good     Standing balance support: During functional activity;No upper extremity supported Standing balance-Leahy Scale: Good                               Pertinent Vitals/Pain Pain Assessment: No/denies pain    Home Living Family/patient expects to be discharged to:: Private residence Living Arrangements: Alone Available Help at Discharge: Family;Friend(s);Available PRN/intermittently   Home Access: Level entry       Home Layout: One level Home Equipment: None  Prior Function Prior Level of Function : Independent/Modified Independent;Working/employed             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 Extremity Assessment Upper Extremity Assessment: Overall WFL for tasks assessed    Lower Extremity Assessment Lower Extremity Assessment: Overall WFL for tasks assessed    Cervical / Trunk  Assessment Cervical / Trunk Assessment: Normal  Communication   Communication: No difficulties  Cognition Arousal/Alertness: Awake/alert Behavior During Therapy: WFL for tasks assessed/performed Overall Cognitive Status: Impaired/Different from baseline Area of Impairment: Safety/judgement                         Safety/Judgement: Decreased awareness of safety              General Comments      Exercises     Assessment/Plan    PT Assessment Patient needs continued PT services  PT Problem List Decreased mobility;Decreased safety awareness       PT Treatment Interventions Gait training;Stair training;Functional mobility training;Therapeutic activities;Therapeutic exercise;Neuromuscular re-education;Balance training;Patient/family education    PT Goals (Current goals can be found in the Care Plan section)  Acute Rehab PT Goals Patient Stated Goal: to go home tomorrow PT Goal Formulation: With patient Time For Goal Achievement: 09/13/21 Potential to Achieve Goals: Good    Frequency Min 3X/week   Barriers to discharge        Co-evaluation               AM-PAC PT "6 Clicks" Mobility  Outcome Measure Help needed turning from your back to your side while in a flat bed without using bedrails?: None Help needed moving from lying on your back to sitting on the side of a flat bed without using bedrails?: None Help needed moving to and from a bed to a chair (including a wheelchair)?: None Help needed standing up from a chair using your arms (e.g., wheelchair or bedside chair)?: None Help needed to walk in hospital room?: None Help needed climbing 3-5 steps with a railing? : A Little 6 Click Score: 23    End of Session   Activity Tolerance: Patient tolerated treatment well Patient left: in bed;with call bell/phone within reach Nurse Communication: Mobility status PT Visit Diagnosis: Other abnormalities of gait and mobility (R26.89)    Time:  4132-4401 PT Time Calculation (min) (ACUTE ONLY): 10 min   Charges:   PT Evaluation $PT Eval Low Complexity: 1 Low          Ginette Pitman, PT, DPT  Acute Rehabilitation Services Office [phone]   Alessandra Bevels Cherrill Scrima 08/30/2021, 1:15 PM

## 2021-08-30 NOTE — Progress Notes (Signed)
Pt noncompliant with using urinal for I&O and keeping monitor on (ripped it off to shower when was specifically told that he would need a doctor's order).

## 2021-08-30 NOTE — Plan of Care (Signed)
  Problem: Education: Goal: Knowledge of General Education information will improve Description: Including pain rating scale, medication(s)/side effects and non-pharmacologic comfort measures Outcome: Progressing   Problem: Health Behavior/Discharge Planning: Goal: Ability to manage health-related needs will improve Outcome: Progressing   Problem: Clinical Measurements: Goal: Ability to maintain clinical measurements within normal limits will improve Outcome: Progressing Goal: Will remain free from infection Outcome: Progressing Goal: Diagnostic test results will improve Outcome: Progressing Goal: Respiratory complications will improve Outcome: Progressing Goal: Cardiovascular complication will be avoided Outcome: Progressing   Problem: Activity: Goal: Risk for activity intolerance will decrease Outcome: Progressing   Problem: Coping: Goal: Level of anxiety will decrease Outcome: Progressing   Problem: Elimination: Goal: Will not experience complications related to bowel motility Outcome: Progressing Goal: Will not experience complications related to urinary retention Outcome: Progressing   Problem: Safety: Goal: Ability to remain free from injury will improve Outcome: Progressing   Problem: Skin Integrity: Goal: Risk for impaired skin integrity will decrease Outcome: Progressing   Problem: Education: Goal: Ability to demonstrate management of disease process will improve Outcome: Progressing Goal: Ability to verbalize understanding of medication therapies will improve Outcome: Progressing Goal: Individualized Educational Video(s) Outcome: Progressing   Problem: Activity: Goal: Capacity to carry out activities will improve Outcome: Progressing   Problem: Cardiac: Goal: Ability to achieve and maintain adequate cardiopulmonary perfusion will improve Outcome: Progressing   Problem: Education: Goal: Ability to describe self-care measures that may prevent or  decrease complications (Diabetes Survival Skills Education) will improve Outcome: Progressing Goal: Individualized Educational Video(s) Outcome: Progressing   Problem: Coping: Goal: Ability to adjust to condition or change in health will improve Outcome: Progressing   Problem: Fluid Volume: Goal: Ability to maintain a balanced intake and output will improve Outcome: Progressing   Problem: Health Behavior/Discharge Planning: Goal: Ability to identify and utilize available resources and services will improve Outcome: Progressing Goal: Ability to manage health-related needs will improve Outcome: Progressing   Problem: Metabolic: Goal: Ability to maintain appropriate glucose levels will improve Outcome: Progressing   Problem: Nutritional: Goal: Progress toward achieving an optimal weight will improve Outcome: Progressing   Problem: Skin Integrity: Goal: Risk for impaired skin integrity will decrease Outcome: Progressing   Problem: Tissue Perfusion: Goal: Adequacy of tissue perfusion will improve Outcome: Progressing

## 2021-08-30 NOTE — Plan of Care (Signed)
  Problem: Nutritional: Goal: Maintenance of adequate nutrition will improve Outcome: Completed/Met   Problem: Pain Managment: Goal: General experience of comfort will improve Outcome: Completed/Met   Problem: Nutrition: Goal: Adequate nutrition will be maintained Outcome: Completed/Met

## 2021-08-30 NOTE — Progress Notes (Signed)
PROGRESS NOTE        PATIENT DETAILS Name: Robert Sanchez Age: 65 y.o. Sex: male Date of Birth: 1955-12-14 Admit Date: 08/27/2021 Admitting Physician John Giovanni, MD [id number], Odette Horns, MD  Brief Narrative: Patient is a 65 y.o. male with history of CAD s/p CABG, DM-2, HFrEF, PAD, medication noncompliance presented with acute hypoxic respiratory failure (initially on CPAP/NRB) due to decompensated systolic heart failure.  See below for further details.  Subjective: Feels better-less shortness of breath.  Objective: Vitals: Blood pressure (!) 119/59, pulse (!) 42, temperature 97.6 F (36.4 C), temperature source Oral, resp. rate 18, height  (1.727 m), weight 92.9 kg, SpO2 97 %.   Exam: Gen Exam:Alert awake-not in any distress HEENT:atraumatic, normocephalic Chest: B/L clear to auscultation anteriorly CVS:S1S2 regular Abdomen:soft non tender, non distended Extremities:no edema Neurology: Non focal Skin: no rash   Pertinent Labs/Radiology: Creatinine: 1.22  10/27>>CXR: Bilateral interstitial edema 10/28>> Echo: EF 35-40%, global hypokinesis.  RV systolic function moderately reduced.  10/29>> CXR: Decreasing interstitial edema  Assessment/Plan: Acute hypoxic respiratory failure due to HFrEF with exacerbation: Continues to improve-titrated to room air today-switch to oral Lasix-continue beta-blocker-apparently he has had issues with lisinopril/ACE inhibitor in the past-BP also in the low 100s this morning-May need to defer adding further agents including Entresto for now.     DM-2 with uncontrolled hyperglycemia (A1c 13.3 on 10/28): CBGs relatively stable-due to financial issues have switched patient from Lantus to insulin 70/30 today-we will reassess on 10/31.  AKI: Mild-hemodynamically mediated-improved.  Leukocytosis: Reactive-no indication of infection-no longer on doxycycline.  Minimally elevated troponin: Insignificant-due to  demand ischemia-no indication of ACS.  History of CAD s/p CABG: No chest pain-SOB from decompensated heart failure.  History of PAD: Has had several vascular procedures in lower extremities-remains on Xarelto/aspirin.  HTN: BP soft but stable-continue Coreg/Lasix.  HLD: Continue Lipitor  Noncompliance with medication/follow-up: Counseled  Obesity: Estimated body mass index is 31.12 kg/m as calculated from the following:   Height as of this encounter:  (1.727 m).   Weight as of this encounter: 92.9 kg.   Procedures: None Consults: None DVT Prophylaxis: Xarelto Code Status:Full code  Family Communication: None at bedside-he does not wish for me to talk to his sister or other family members (ED RN present at bedside)  Time spent: 25 minutes-Greater than 50% of this time was spent in counseling, explanation of diagnosis, planning of further management, and coordination of care.  Diet: Diet Order             Diet heart healthy/carb modified Room service appropriate? Yes; Fluid consistency: Thin  Diet effective now                      Disposition Plan: Status is: Inpatient  Remains inpatient appropriate because: Severe hypoxia due to decompensated heart failure-on IV Lasix.  Not yet stable for discharge.   Antimicrobial agents: Anti-infectives (From admission, onward)    Start     Dose/Rate Route Frequency Ordered Stop   08/28/21 0415  doxycycline (VIBRA-TABS) tablet 100 mg  Status:  Discontinued        100 mg Oral Every 12 hours 08/28/21 0409 08/28/21 1219        MEDICATIONS: Scheduled Meds:  aspirin EC  81 mg Oral Daily   atorvastatin  80 mg Oral QHS  carvedilol  6.25 mg Oral BID WC   furosemide  40 mg Oral Daily   [START ON 08/31/2021] influenza vac split quadrivalent PF  0.5 mL Intramuscular Tomorrow-1000   insulin aspart  0-20 Units Subcutaneous TID WC   insulin aspart protamine- aspart  30 Units Subcutaneous Q supper   insulin aspart  protamine- aspart  45 Units Subcutaneous Q breakfast   ipratropium-albuterol  3 mL Nebulization Q6H   magnesium oxide  400 mg Oral Daily   potassium chloride  20 mEq Oral Daily   rivaroxaban  20 mg Oral Q supper   Continuous Infusions: PRN Meds:.acetaminophen **OR** acetaminophen, albuterol   I have personally reviewed following labs and imaging studies  LABORATORY DATA: CBC: Recent Labs  Lab 08/27/21 2316 08/27/21 2323 08/28/21 0237 08/28/21 0453  WBC 21.6*  --   --  12.1*  NEUTROABS 17.6*  --   --   --   HGB 13.3 14.3 13.3 12.5*  HCT 40.2 42.0 39.0 37.8*  MCV 91.8  --   --  89.8  PLT 508*  --   --  389     Basic Metabolic Panel: Recent Labs  Lab 08/27/21 2316 08/27/21 2323 08/28/21 0237 08/28/21 0453 08/28/21 1944 08/29/21 0406 08/30/21 0732  NA 130* 133* 135 133* 133* 138 135  K 4.9 4.9 3.7 3.6 3.9 3.8 4.2  CL 97* 101  --  102 100 104 103  CO2 19*  --   --  19* 24 23 25   GLUCOSE 563* 589*  --  291* 432* 193* 228*  BUN 17 19  --  20 26* 26* 25*  CREATININE 1.41* 1.30*  --  1.34* 1.34* 1.21 1.22  CALCIUM 8.5*  --   --  8.8* 8.8* 8.8* 8.8*  MG  --   --   --   --   --  2.1  --      GFR: Estimated Creatinine Clearance: 68.5 mL/min (by C-G formula based on SCr of 1.22 mg/dL).  Liver Function Tests: No results for input(s): AST, ALT, ALKPHOS, BILITOT, PROT, ALBUMIN in the last 168 hours. No results for input(s): LIPASE, AMYLASE in the last 168 hours. No results for input(s): AMMONIA in the last 168 hours.  Coagulation Profile: No results for input(s): INR, PROTIME in the last 168 hours.  Cardiac Enzymes: No results for input(s): CKTOTAL, CKMB, CKMBINDEX, TROPONINI in the last 168 hours.  BNP (last 3 results) No results for input(s): PROBNP in the last 8760 hours.  Lipid Profile: No results for input(s): CHOL, HDL, LDLCALC, TRIG, CHOLHDL, LDLDIRECT in the last 72 hours.  Thyroid Function Tests: No results for input(s): TSH, T4TOTAL, FREET4, T3FREE,  THYROIDAB in the last 72 hours.  Anemia Panel: No results for input(s): VITAMINB12, FOLATE, FERRITIN, TIBC, IRON, RETICCTPCT in the last 72 hours.  Urine analysis:    Component Value Date/Time   COLORURINE STRAW (A) 08/28/2021 0153   APPEARANCEUR CLEAR 08/28/2021 0153   LABSPEC 1.016 08/28/2021 0153   PHURINE 5.0 08/28/2021 0153   GLUCOSEU >=500 (A) 08/28/2021 0153   HGBUR SMALL (A) 08/28/2021 0153   BILIRUBINUR NEGATIVE 08/28/2021 0153   KETONESUR 5 (A) 08/28/2021 0153   PROTEINUR 100 (A) 08/28/2021 0153   NITRITE NEGATIVE 08/28/2021 0153   LEUKOCYTESUR NEGATIVE 08/28/2021 0153    Sepsis Labs: Lactic Acid, Venous    Component Value Date/Time   LATICACIDVEN 2.8 (HH) 07/31/2020 1730    MICROBIOLOGY: Recent Results (from the past 240 hour(s))  Resp Panel by RT-PCR (  Flu A&B, Covid) Nasopharyngeal Swab     Status: None   Collection Time: 08/28/21 12:44 AM   Specimen: Nasopharyngeal Swab; Nasopharyngeal(NP) swabs in vial transport medium  Result Value Ref Range Status   SARS Coronavirus 2 by RT PCR NEGATIVE NEGATIVE Final    Comment: (NOTE) SARS-CoV-2 target nucleic acids are NOT DETECTED.  The SARS-CoV-2 RNA is generally detectable in upper respiratory specimens during the acute phase of infection. The lowest concentration of SARS-CoV-2 viral copies this assay can detect is 138 copies/mL. A negative result does not preclude SARS-Cov-2 infection and should not be used as the sole basis for treatment or other patient management decisions. A negative result may occur with  improper specimen collection/handling, submission of specimen other than nasopharyngeal swab, presence of viral mutation(s) within the areas targeted by this assay, and inadequate number of viral copies(<138 copies/mL). A negative result must be combined with clinical observations, patient history, and epidemiological information. The expected result is Negative.  Fact Sheet for Patients:   BloggerCourse.com  Fact Sheet for Healthcare Providers:  SeriousBroker.it  This test is no t yet approved or cleared by the [place] FDA and  has been authorized for detection and/or diagnosis of SARS-CoV-2 by FDA under an Emergency Use Authorization (EUA). This EUA will remain  in effect (meaning this test can be used) for the duration of the COVID-19 declaration under Section 564(b)(1) of the Act, 21 U.S.C.section 360bbb-3(b)(1), unless the authorization is terminated  or revoked sooner.       Influenza A by PCR NEGATIVE NEGATIVE Final   Influenza B by PCR NEGATIVE NEGATIVE Final    Comment: (NOTE) The Xpert Xpress SARS-CoV-2/FLU/RSV plus assay is intended as an aid in the diagnosis of influenza from Nasopharyngeal swab specimens and should not be used as a sole basis for treatment. Nasal washings and aspirates are unacceptable for Xpert Xpress SARS-CoV-2/FLU/RSV testing.  Fact Sheet for Patients: BloggerCourse.com  Fact Sheet for Healthcare Providers: SeriousBroker.it  This test is not yet approved or cleared by the [place] FDA and has been authorized for detection and/or diagnosis of SARS-CoV-2 by FDA under an Emergency Use Authorization (EUA). This EUA will remain in effect (meaning this test can be used) for the duration of the COVID-19 declaration under Section 564(b)(1) of the Act, 21 U.S.C. section 360bbb-3(b)(1), unless the authorization is terminated or revoked.  Performed at [facility], [address], [place], [place] [address]     RADIOLOGY STUDIES/RESULTS: DG Chest Port 1 View  Result Date: 08/29/2021 CLINICAL DATA:  Shortness of breath EXAM: PORTABLE CHEST 1 VIEW COMPARISON:  Previous studies including the examination of 08/27/2021 FINDINGS: Transverse diameter of heart is increased. There is evidence of previous coronary bypass surgery.  There is interval decrease in interstitial markings in both lungs. There is no new focal pulmonary infiltrate. There is no significant pleural effusion or pneumothorax. IMPRESSION: There is interval decrease in interstitial markings in both lungs suggesting resolving pulmonary edema. No new focal pulmonary infiltrates are seen. Reading location: [place], [place]. Electronically Signed   By: Ernie Avena M.D.   On: 08/29/2021 08:01   ECHOCARDIOGRAM COMPLETE  Result Date: 08/28/2021    ECHOCARDIOGRAM REPORT   Patient Name:   Robert Sanchez Date of Exam: 08/28/2021 Medical Rec #:  [id number]        Height:       68.0 in Accession #:    [id number]       Weight:  225.0 lb Date of Birth:  1955/12/11        BSA:          2.149 m Patient Age:    65 years         BP:           129/69 mmHg Patient Gender: M                HR:           84 bpm. Exam Location:  Inpatient Procedure: 2D Echo, Intracardiac Opacification Agent, Color Doppler and Cardiac            Doppler Indications:    CHF  History:        Patient has prior history of Echocardiogram examinations, most                 recent 06/05/2018. CAD, Prior CABG; Risk Factors:Hypertension and                 Diabetes.  Sonographer:    Cleatis Polka Referring Phys: [id number] VASUNDHRA RATHORE IMPRESSIONS  1. Left ventricular ejection fraction, by estimation, is 35 to 40%. The left ventricle has moderately decreased function. The left ventricle demonstrates global hypokinesis. The left ventricular internal cavity size was moderately dilated. Left ventricular diastolic parameters are consistent with Grade II diastolic dysfunction (pseudonormalization). Elevated left ventricular end-diastolic pressure.  2. Right ventricular systolic function is moderately reduced. The right ventricular size is mildly enlarged.  3. Left atrial size was mildly dilated.  4. Right atrial size was mildly dilated.  5. The mitral valve is grossly normal. Trivial mitral valve  regurgitation.  6. The aortic valve is tricuspid. Aortic valve regurgitation is not visualized. FINDINGS  Left Ventricle: Left ventricular ejection fraction, by estimation, is 35 to 40%. The left ventricle has moderately decreased function. The left ventricle demonstrates global hypokinesis. The left ventricular internal cavity size was moderately dilated. There is borderline left ventricular hypertrophy. Left ventricular diastolic parameters are consistent with Grade II diastolic dysfunction (pseudonormalization). Elevated left ventricular end-diastolic pressure. Right Ventricle: The right ventricular size is mildly enlarged. Right vetricular wall thickness was not well visualized. Right ventricular systolic function is moderately reduced. Left Atrium: Left atrial size was mildly dilated. Right Atrium: Right atrial size was mildly dilated. Pericardium: There is no evidence of pericardial effusion. Mitral Valve: The mitral valve is grossly normal. Trivial mitral valve regurgitation. Tricuspid Valve: The tricuspid valve is normal in structure. Tricuspid valve regurgitation is not demonstrated. Aortic Valve: The aortic valve is tricuspid. Aortic valve regurgitation is not visualized. Aortic valve peak gradient measures 7.0 mmHg. Pulmonic Valve: The pulmonic valve was normal in structure. Pulmonic valve regurgitation is not visualized. Aorta: The aortic root and ascending aorta are structurally normal, with no evidence of dilitation. IAS/Shunts: The atrial septum is grossly normal.  LEFT VENTRICLE PLAX 2D LVIDd:         5.80 cm      Diastology LVIDs:         4.90 cm      LV e' medial:    6.09 cm/s LV PW:         1.10 cm      LV E/e' medial:  20.9 LV IVS:        1.10 cm      LV e' lateral:   7.51 cm/s LVOT diam:     2.00 cm      LV E/e' lateral: 16.9 LV SV:  66 LV SV Index:   30 LVOT Area:     3.14 cm  LV Volumes (MOD) LV vol d, MOD A2C: 202.0 ml LV vol d, MOD A4C: 199.0 ml LV vol s, MOD A2C: 136.0 ml LV vol s,  MOD A4C: 126.0 ml LV SV MOD A2C:     66.0 ml LV SV MOD A4C:     199.0 ml LV SV MOD BP:      72.4 ml RIGHT VENTRICLE            IVC RV Basal diam:  4.00 cm    IVC diam: 2.40 cm RV Mid diam:    2.40 cm RV S prime:     7.29 cm/s TAPSE (M-mode): 1.3 cm LEFT ATRIUM             Index        RIGHT ATRIUM           Index LA diam:        4.90 cm 2.28 cm/m   RA Area:     18.40 cm LA Vol (A2C):   71.4 ml 33.23 ml/m  RA Volume:   50.20 ml  23.36 ml/m LA Vol (A4C):   83.4 ml 38.82 ml/m LA Biplane Vol: 77.9 ml 36.26 ml/m  AORTIC VALVE AV Area (Vmax): 2.51 cm AV Vmax:        132.50 cm/s AV Peak Grad:   7.0 mmHg LVOT Vmax:      106.00 cm/s LVOT Vmean:     75.750 cm/s LVOT VTI:       0.209 m  AORTA Ao Root diam: 3.10 cm Ao Asc diam:  3.10 cm MITRAL VALVE MV Area (PHT): 3.99 cm     SHUNTS MV Decel Time: 190 msec     Systemic VTI:  0.21 m MV E velocity: 127.00 cm/s  Systemic Diam: 2.00 cm MV A velocity: 97.40 cm/s MV E/A ratio:  1.30 Kristeen Miss MD Electronically signed by Kristeen Miss MD Signature Date/Time: 08/28/2021/3:41:16 PM    Final      LOS: 2 days   Jeoffrey Massed, MD  [facility]    To contact the attending provider between 7A-7P or the covering provider during after hours 7P-7A, please log into the web site [email or web address] and access using universal [place] password for that web site. If you do not have the password, please call the hospital operator.  08/30/2021, 11:30 AM

## 2021-08-30 NOTE — TOC Initial Note (Signed)
Transition of Care [facility]) - Initial/Assessment Note    Patient Details  Name: Robert Sanchez MRN: [id number] Date of Birth: [birth year]  [facility] CM/SW Contact:    Lockie Pares, RN Phone Number: 08/30/2021, 9:25 AM  Clinical Narrative:                 65 YO admitted with CHF. Hypoxia  History of CAD, DM, HPTN all uncontrolled when presented to hospital. Patient uninsured Next of kin is sister, on MD assessment, he did not want anyone to be notified. Of admission.  Information sent to [facility] for consideration  PT consult placed for recommendations CM will follow for needs, recommendations, and transitions. Barriers to Discharge: Continued Medical Work up   Patient Goals and CMS Choice        Expected Discharge Plan and Services     Discharge Planning Services: CM Consult                                          Prior Living Arrangements/Services     Patient language and need for interpreter reviewed:: Yes        Need for Family Participation in Patient Care: Yes (Comment) Care giver support system in place?: Yes (comment)   Criminal Activity/Legal Involvement Pertinent to Current Situation/Hospitalization: No - Comment as needed  Activities of Daily Living Home Assistive Devices/Equipment: None ADL Screening (condition at time of admission) Patient's cognitive ability adequate to safely complete daily activities?: Yes Is the patient deaf or have difficulty hearing?: Yes Does the patient have difficulty seeing, even when wearing glasses/contacts?: No Does the patient have difficulty concentrating, remembering, or making decisions?: No Patient able to express need for assistance with ADLs?: No Does the patient have difficulty dressing or bathing?: No Independently performs ADLs?: Yes (appropriate for developmental age) Does the patient have difficulty walking or climbing stairs?: No Weakness of Legs: None Weakness of Arms/Hands:  None  Permission Sought/Granted                  Emotional Assessment       Orientation: : Oriented to Self, Oriented to Place, Oriented to  Time, Oriented to Situation Alcohol / Substance Use: Not Applicable Psych Involvement: No (comment)  Admission diagnosis:  Acute pulmonary edema (HCC) [J81.0] SOB (shortness of breath) [R06.02] CHF exacerbation (HCC) [I50.9] Patient Active Problem List   Diagnosis Date Noted   CHF exacerbation (HCC) 08/28/2021   Acute hypoxemic respiratory failure (HCC) 08/28/2021   Hyperglycemia 08/28/2021   AKI (acute kidney injury) (HCC) 08/28/2021   Elevated troponin 08/28/2021   Hypertension associated with diabetes (HCC) 02/19/2021   Critical limb ischemia with history of revascularization of same extremity (HCC) 01/06/2021   Acute venous embolism and thrombosis of deep vessels of proximal lower extremity (HCC) 07/16/2020   Cellulitis of foot 01/15/2020   Critical ischemia of extremity with history of revascularization of same extremity (HCC) 01/15/2020   Leukocytosis 01/15/2020   CKD (chronic kidney disease), stage III (HCC) 01/15/2020   Acute on chronic systolic CHF (congestive heart failure) (HCC) 05/04/2019   Mild renal insufficiency 05/04/2019   LFT elevation 05/04/2019   PAD (peripheral artery disease) (HCC) 08/21/2018   PVD (peripheral vascular disease) (HCC) 07/18/2018   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   CAD (coronary artery disease) 06/05/2018   Pulmonary edema 06/03/2018   DKA, type 2 (HCC)  06/03/2018   Diabetic acidosis without coma (HCC)    Sebaceous cyst 08/23/2016   Acute pain of right knee 08/23/2016   Hidradenitis 08/23/2016   Right hip pain 08/14/2014   Diabetes mellitus type II, non insulin dependent (HCC) 11/27/2013   Arthritis 11/27/2013   Neuropathy in diabetes (HCC) 11/27/2013   PCP:  Hoy Register, MD Pharmacy:   [facility] [address], [place] - [address] [place]  [address] Phone: [phone] Fax: [phone]  [facility] [address] [place] [address] Phone: [phone] Fax: [phone]  [facility] [address] [place] [address] Phone: [phone] Fax: [phone]     Social Determinants of Health (SDOH) Interventions    Readmission Risk Interventions Readmission Risk Prevention Plan 01/21/2020  Transportation Screening Complete  PCP or Specialist Appt within 5-7 Days Complete  Home Care Screening Complete  Medication Review (RN CM) Complete  Some recent data might be hidden

## 2021-08-31 ENCOUNTER — Other Ambulatory Visit (HOSPITAL_COMMUNITY): Payer: Self-pay

## 2021-08-31 ENCOUNTER — Encounter (HOSPITAL_COMMUNITY): Payer: Self-pay | Admitting: Internal Medicine

## 2021-08-31 DIAGNOSIS — I739 Peripheral vascular disease, unspecified: Secondary | ICD-10-CM

## 2021-08-31 LAB — GLUCOSE, CAPILLARY
Glucose-Capillary: 180 mg/dL — ABNORMAL HIGH (ref 70–99)
Glucose-Capillary: 112 mg/dL — ABNORMAL HIGH (ref 70–99)
Glucose-Capillary: 150 mg/dL — ABNORMAL HIGH (ref 70–99)

## 2021-08-31 LAB — BASIC METABOLIC PANEL
Anion gap: 7 (ref 5–15)
BUN: 28 mg/dL — ABNORMAL HIGH (ref 8–23)
CO2: 26 mmol/L (ref 22–32)
Calcium: 9 mg/dL (ref 8.9–10.3)
Chloride: 104 mmol/L (ref 98–111)
Creatinine, Ser: 1.34 mg/dL — ABNORMAL HIGH (ref 0.61–1.24)
GFR, Estimated: 60 mL/min — ABNORMAL LOW (ref >60)
Glucose, Bld: 123 mg/dL — ABNORMAL HIGH (ref 70–99)
Potassium: 3.9 mmol/L (ref 3.5–5.1)
Sodium: 137 mmol/L (ref 135–145)

## 2021-08-31 MED ORDER — INSULIN ISOPHANE & REGULAR (HUMAN 70-30)100 UNIT/ML KWIKPEN
PEN_INJECTOR | SUBCUTANEOUS | 5 refills | Status: DC
  Filled 2021-08-31 – 2021-09-23 (×2): qty 18, 30d supply, fill #0
  Filled 2021-10-27: qty 18, 30d supply, fill #1
  Filled 2021-12-15: qty 18, 30d supply, fill #0

## 2021-08-31 MED ORDER — POTASSIUM CHLORIDE CRYS ER 20 MEQ PO TBCR
20.0000 meq | EXTENDED_RELEASE_TABLET | Freq: Every day | ORAL | 5 refills | Status: DC
Start: 2021-08-31 — End: 2022-04-22
  Filled 2021-08-31 – 2021-09-23 (×2): qty 30, 30d supply, fill #0
  Filled 2021-10-27: qty 30, 30d supply, fill #1
  Filled 2021-12-15: qty 30, 30d supply, fill #0
  Filled 2022-02-01: qty 30, 30d supply, fill #1
  Filled 2022-03-19: qty 30, 30d supply, fill #2

## 2021-08-31 MED ORDER — GLUCOSE BLOOD VI STRP
ORAL_STRIP | 5 refills | Status: DC
  Filled 2021-08-31: qty 100, 25d supply, fill #0
  Filled 2021-10-27: qty 100, 25d supply, fill #1
  Filled 2021-12-18: qty 100, 25d supply, fill #2

## 2021-08-31 MED ORDER — INSULIN ASPART PROT & ASPART (70-30 MIX) 100 UNIT/ML ~~LOC~~ SUSP: 30.0000 [IU] | Freq: Every day | SUBCUTANEOUS | Status: DC

## 2021-08-31 MED ORDER — BLOOD GLUCOSE MONITOR SYSTEM W/DEVICE KIT
PACK | 0 refills | Status: DC
  Filled 2021-08-31: qty 1, 30d supply, fill #0

## 2021-08-31 MED ORDER — INSULIN PEN NEEDLE 32G X 4 MM MISC
1.0000 | 0 refills | Status: DC | PRN
  Filled 2021-08-31 – 2021-10-27 (×2): qty 100, 15d supply, fill #0

## 2021-08-31 MED ORDER — ACCU-CHEK SOFTCLIX LANCETS MISC
5 refills | Status: DC
  Filled 2021-08-31: qty 100, 25d supply, fill #0
  Filled 2021-10-27: qty 100, 25d supply, fill #1

## 2021-08-31 MED ORDER — METFORMIN HCL 500 MG PO TABS
500.0000 mg | ORAL_TABLET | Freq: Two times a day (BID) | ORAL | 5 refills | Status: DC
  Filled 2021-08-31 – 2021-09-23 (×2): qty 60, 30d supply, fill #0
  Filled 2021-10-27: qty 60, 30d supply, fill #1
  Filled 2021-12-15: qty 60, 30d supply, fill #0
  Filled 2022-02-01: qty 60, 30d supply, fill #1
  Filled 2022-03-19: qty 60, 30d supply, fill #2

## 2021-08-31 MED ORDER — ATORVASTATIN CALCIUM 80 MG PO TABS
80.0000 mg | ORAL_TABLET | Freq: Every day | ORAL | 3 refills | Status: DC
  Filled 2021-08-31 – 2021-09-23 (×2): qty 30, 30d supply, fill #0
  Filled 2021-10-27: qty 30, 30d supply, fill #1
  Filled 2021-12-15: qty 30, 30d supply, fill #0

## 2021-08-31 MED ORDER — ASPIRIN 81 MG PO TBEC: 81.0000 mg | DELAYED_RELEASE_TABLET | Freq: Every day | ORAL | Status: AC

## 2021-08-31 MED ORDER — RIVAROXABAN 20 MG PO TABS
20.0000 mg | ORAL_TABLET | Freq: Every day | ORAL | 5 refills | Status: DC
  Filled 2021-08-31 – 2021-09-23 (×2): qty 30, 30d supply, fill #0
  Filled 2021-10-27: qty 30, 30d supply, fill #1
  Filled 2021-12-15: qty 30, 30d supply, fill #0
  Filled 2022-02-01: qty 30, 30d supply, fill #1

## 2021-08-31 MED ORDER — CARVEDILOL 6.25 MG PO TABS
6.2500 mg | ORAL_TABLET | Freq: Two times a day (BID) | ORAL | 5 refills | Status: DC
  Filled 2021-08-31 – 2021-09-23 (×2): qty 60, 30d supply, fill #0
  Filled 2021-10-27: qty 60, 30d supply, fill #1
  Filled 2021-12-15: qty 60, 30d supply, fill #0
  Filled 2022-02-01: qty 60, 30d supply, fill #1
  Filled 2022-03-19: qty 60, 30d supply, fill #2

## 2021-08-31 MED ORDER — FUROSEMIDE 40 MG PO TABS
40.0000 mg | ORAL_TABLET | Freq: Every day | ORAL | 5 refills | Status: DC
  Filled 2021-08-31 – 2021-09-23 (×2): qty 30, 30d supply, fill #0
  Filled 2021-10-27: qty 30, 30d supply, fill #1
  Filled 2021-12-15: qty 30, 30d supply, fill #0
  Filled 2022-02-01: qty 30, 30d supply, fill #1
  Filled 2022-03-19: qty 30, 30d supply, fill #2

## 2021-08-31 NOTE — Progress Notes (Signed)
Heart Failure Nurse Navigator Progress Note  PCP: Hoy Register, MD PCP-Cardiologist: Dr. Riki Altes (3+ years ago per pt) Admission Diagnosis: Acute pulm edema Admitted from: home alone   Presentation:   Robert Sanchez presented 10/27 with increased SHOB. Planned DC today, consult placed over weekend. Pt resting in bed on room air. Pt interactive with interview process. 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 Pt states he is unable to afford his insurance offered through his work. Pt has good social support of friends in the area. Has 1 adult daughter. Pt states he is on patient assistance for Xarelto. Pt needs optimized for HF GDMT, placed [facility] TOC appt for 11/3 @ 11AM. Pt drives company vehicle. Pt eats fast food often while on the job as he has short lunch breaks. Educated on dietary/sodium modifications. Pt states he has not been taking all his medications (could not remember which ones he has been taking) d/t cost of medications. Pt knows he has not been taking his medications for his diabetes.  Pt follows with Dr. Chestine Spore and vascular team for PAD of LLE, pt states the next time his leg occludes he will need an amputation, he plans to work until that time to save up some money. Pt intermittently smokes, educated on smoking cessation.   ECHO/ LVEF: 35-40%, G2DD.   Clinical Course:  Past Medical History:  Diagnosis Date   Acute pulmonary edema (HCC) 06/05/2018   Arthritis    COPD (chronic obstructive pulmonary disease) (HCC)    " beginning stages " per pt   Coronary artery disease    Diabetes mellitus without complication (HCC)    type 2   Diabetic neuropathy (HCC)    Diabetic neuropathy (HCC)    Emphysema/COPD (HCC)    " beginning stages"   GERD (gastroesophageal reflux disease)    HLD (hyperlipidemia)    HOH (hard of hearing)    no hearing aids   Myocardial infarction (HCC)    Peripheral vascular disease (HCC)      Social History   Socioeconomic History    Marital status: Single    Spouse name: Not on file   Number of children: 1   Years of education: Not on file   Highest education level: 10th grade  Occupational History   Not on file  Tobacco Use   Smoking status: Some Days    Packs/day: 1.00    Years: 50.00    Pack years: 50.00    Types: Cigarettes    Last attempt to quit: 06/02/2018    Years since quitting: 3.2   Smokeless tobacco: Never   Tobacco comments:    1PPD x 50 years. 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   Vaping Use   Vaping Use: Never used  Substance and Sexual Activity   Alcohol use: Yes    Comment: occasional   Drug use: Not Currently    Frequency: 1.0 times per week    Types: Marijuana   Sexual activity: Not on file  Other Topics Concern   Not on file  Social History Narrative   Not on file   Social Determinants of Health   Financial Resource Strain: Low Risk    Difficulty of Paying Living Expenses: Not very hard  Food Insecurity: No Food Insecurity   Worried About Running Out of Food in the Last Year: Never true   Ran Out of Food in the Last Year: Never true  Transportation Needs: No Transportation Needs   Lack  of Transportation (Medical): No   Lack of Transportation (Non-Medical): No  Physical Activity: Not on file  Stress: Not on file  Social Connections: Not on file    High Risk Criteria for Readmission and/or Poor Patient Outcomes: Heart failure hospital admissions (last 6 months): 1  No Show rate: NA Difficult social situation: yes Demonstrates medication adherence: no, d/t cost Primary Language: English Literacy level: able to read/write and comprehend.   Education Assessment and Provision:  Detailed education and instructions provided on heart failure disease management including the following:  Signs and symptoms of Heart Failure When to call the physician Importance of daily weights Low sodium diet Fluid restriction Medication management Anticipated future follow-up  appointments  Patient education given on each of the above topics.  Patient acknowledges understanding via teach back method and acceptance of all instructions.  Education Materials:  "Living Better With Heart Failure" Booklet, HF zone tool, & Daily Weight Tracker Tool.  Patient has scale at home: yes, pt thinks he has one.  Patient has pill box at home: no, given from AHF clinic   Barriers of Care:   -no insurance -financial strain (cost of meds) -medication compliance -dietary compliance (eats fast food often d/t short lunch breaks)  Considerations/Referrals:   Referral made to Heart Failure Pharmacist Stewardship: yes, to see at [facility] TOC appt. As consult completed day of DC. Referral made to Heart Failure CSW/NCM TOC: yes, to see at [facility] TOC appt. As consult was completed day of DC. Referral made to Heart & Vascular TOC clinic: yes, 11/3 @ 11AM  Items for Follow-up on DC/TOC: -optimize -insurance -medication costs -cardiology follow up Algie Coffer)?   Ozella Rocks, MSN, RN Heart Failure Nurse Navigator [phone]

## 2021-08-31 NOTE — Discharge Summary (Addendum)
PATIENT DETAILS Name: Robert Sanchez Age: 65 y.o. Sex: male Date of Birth: 09-26-1956 MRN: [id number]. Admitting Physician: Shela Leff, MD [id number], Charlane Ferretti, MD  Admit Date: 08/27/2021 Discharge date: 08/31/2021  Recommendations for Outpatient Follow-up:  Follow up with PCP in 1-2 weeks Please obtain CMP/CBC in one week Please continue to optimize insulin regimen.  Admitted From:  Home  Disposition: [place]: No  Equipment/Devices: None  Discharge Condition: Stable  CODE STATUS: FULL CODE  Diet recommendation:  Diet Order             Diet - low sodium heart healthy           Diet heart healthy/carb modified Room service appropriate? Yes; Fluid consistency: Thin  Diet effective now                    Brief Summary: Patient is a 65 y.o. male with history of CAD s/p CABG, DM-2, HFrEF, PAD, medication noncompliance presented with acute hypoxic respiratory failure (initially on CPAP/NRB) due to decompensated systolic heart failure.  See below for further details.   Pertinent Labs/Radiology: 10/27>>CXR: Bilateral interstitial edema 10/28>> Echo: EF 35-40%, global hypokinesis.  RV systolic function moderately reduced.  10/29>> CXR: Decreasing interstitial edema  Brief Hospital Course: Acute hypoxic respiratory failure due to HFrEF with exacerbation: Treated with IV Lasix-briefly required CPAP/NRB when he first presented-but with diuresis rapidly improved.  He is now euvolemic-on oral Lasix and Coreg.  Per patient-in 2017-he has had numerous syncopal episodes after he was started on lisinopril from what sounds like hypotension.  Since his blood pressure is still in his in the low 100s-I have not added losartan/Entresto for now-we will ensure that he is compliant with medication/follow-up-before adding further medications.  Follow-up appointment at the transitional heart failure clinic has been arranged for patient to follow-up-further  optimization of his medication regimen can be considered once he keeps his appointment consistently.   DM-2 with uncontrolled hyperglycemia (A1c 13.3 on 10/28): Had uncontrolled hyperglycemia-requiring IV insulin infusion when he first presented-he was then transitioned to Lantus/NovoLog regimen-due to financial issues-he has been transitioned to insulin 70/30-extensive education has been provided by the nursing staff-patient will follow up with his primary care clinic for further optimization of his regimen.  I will add metformin on discharge (previously noncompliant).   AKI: Mild-hemodynamically mediated-improved.   Leukocytosis: Reactive-no indication of infection-no longer on doxycycline.   Minimally elevated troponin: Insignificant-due to demand ischemia-no indication of ACS.   History of CAD s/p CABG: No chest pain-SOB from decompensated heart failure.   History of PAD/ischemic leg: Has had several vascular procedures in lower extremities-remains on Xarelto/aspirin.   HTN: BP soft but stable-continue Coreg/Lasix.   HLD: Continue Lipitor   Noncompliance with medication/follow-up: Counseled  Obesity: Estimated body mass index is 31.34 kg/m as calculated from the following:   Height as of this encounter: '5\' 8"'  (1.727 m).   Weight as of this encounter: 93.5 kg.    Procedures None  Discharge Diagnoses:  Principal Problem:   CHF exacerbation ([place]) Active Problems:   Acute hypoxemic respiratory failure (H[facility])   Hyperglycemia   AKI (acute kidney injury) ([place])   Elevated troponin   Discharge Instructions:  Activity:  As tolerated  Discharge Instructions     (HEART FAILURE PATIENTS) Call MD:  Anytime you have any of the following symptoms: 1) 3 pound weight gain in 24 hours or 5 pounds in 1 week 2) shortness of breath, with or without  a dry hacking cough 3) swelling in the hands, feet or stomach 4) if you have to sleep on extra pillows at night in order to breathe.    Complete by: As directed    Avoid straining   Complete by: As directed    Diet - low sodium heart healthy   Complete by: As directed    Heart Failure patients record your daily weight using the same scale at the same time of day   Complete by: As directed    Increase activity slowly   Complete by: As directed    STOP any activity that causes chest pain, shortness of breath, dizziness, sweating, or exessive weakness   Complete by: As directed       Allergies as of 08/31/2021       Reactions   Lisinopril Other (See Comments)   Syncope   Codeine Rash        Medication List     STOP taking these medications    glipiZIDE 10 MG tablet Commonly known as: GLUCOTROL   Lantus SoloStar 100 UNIT/ML Solostar Pen Generic drug: insulin glargine       TAKE these medications    Accu-Chek Guide test strip Generic drug: glucose blood Use to test blood sugars up to 4 times daily as directed.   Accu-Chek Guide w/Device Kit Use to test blood sugars up to 4 times daily as directed.   Accu-Chek Softclix Lancets lancets Use to test blood sugars up to 4 times daily as directed.   aspirin 81 MG EC tablet Take 1 tablet (81 mg total) by mouth daily. Swallow whole.   atorvastatin 80 MG tablet Commonly known as: LIPITOR Take 1 tablet (80 mg total) by mouth at bedtime.   carvedilol 6.25 MG tablet Commonly known as: COREG Take 1 tablet (6.25 mg total) by mouth 2 (two) times daily with a meal.   furosemide 40 MG tablet Commonly known as: LASIX Take 1 tablet (40 mg total) by mouth daily.   metFORMIN 500 MG tablet Commonly known as: GLUCOPHAGE Take 1 tablet (500 mg total) by mouth 2 (two) times daily with a meal. What changed:  medication strength how much to take   multivitamin tablet Take 1 tablet by mouth daily.   NovoLIN 70/30 Kwikpen (70-30) 100 UNIT/ML KwikPen Generic drug: insulin isophane & regular human KwikPen Inject 30 units with breakfast and dinner.   Pentips  32G X 4 MM Misc Generic drug: Insulin Pen Needle Use as directed with insulin pen What changed:  medication strength when to take this reasons to take this   potassium chloride SA 20 MEQ tablet Commonly known as: KLOR-CON Take 1 tablet (20 mEq total) by mouth daily.   rivaroxaban 20 MG Tabs tablet Commonly known as: XARELTO Take 1 tablet (20 mg total) by mouth daily with supper. What changed:  how much to take Another medication with the same name was removed. Continue taking this medication, and follow the directions you see here.        Follow-up Information     [facility]. Go to.   Specialty: Cardiology Why: Thursday, November 3 @ 11AM for HV [facility] within [place]. Bring all your medications with you. FREE valet parking at [employer], off [address]. Contact information: [address] [id number] [place], [place], [place]. Go on 09/21/2021.   Specialty: Family Medicine Why: '@9' :30am  Contact information: [address] [place] [address] [phone]                Allergies  Allergen Reactions   Lisinopril Other (See Comments)    Syncope   Codeine Rash      Consultations:  None   Other Procedures/Studies: DG Chest Port 1 View  Result Date: 08/29/2021 CLINICAL DATA:  Shortness of breath EXAM: PORTABLE CHEST 1 VIEW COMPARISON:  Previous studies including the examination of 08/27/2021 FINDINGS: Transverse diameter of heart is increased. There is evidence of previous coronary bypass surgery. There is interval decrease in interstitial markings in both lungs. There is no new focal pulmonary infiltrate. There is no significant pleural effusion or pneumothorax. IMPRESSION: There is interval decrease in interstitial markings in both lungs suggesting resolving pulmonary edema. No new focal pulmonary infiltrates are seen. Reading  location: [place], [place]. Electronically Signed   By: Elmer Picker M.D.   On: 08/29/2021 08:01   DG Chest Port 1 View  Result Date: 08/27/2021 CLINICAL DATA:  Shortness of breath. EXAM: PORTABLE CHEST 1 VIEW COMPARISON:  January 15, 2020 FINDINGS: Multiple sternal wires are noted. Moderate severity diffusely increased interstitial lung markings are seen. This is increased in severity when compared to the prior study. There is no evidence of a pleural effusion or pneumothorax. The cardiac silhouette is mildly enlarged. The visualized skeletal structures are unremarkable. IMPRESSION: Findings consistent with moderate severity interstitial edema. Electronically Signed   By: Virgina Norfolk M.D.   On: 08/27/2021 23:29   ECHOCARDIOGRAM COMPLETE  Result Date: 08/28/2021    ECHOCARDIOGRAM REPORT   Patient Name:   Robert Sanchez Date of Exam: 08/28/2021 Medical Rec #:  [id number]        Height:       68.0 in Accession #:    [id number]       Weight:       225.0 lb Date of Birth:  December 01, 1955        BSA:          2.149 m Patient Age:    24 years         BP:           129/69 mmHg Patient Gender: M                HR:           84 bpm. Exam Location:  Inpatient Procedure: 2D Echo, Intracardiac Opacification Agent, Color Doppler and Cardiac            Doppler Indications:    CHF  History:        Patient has prior history of Echocardiogram examinations, most                 recent 06/05/2018. CAD, Prior CABG; Risk Factors:Hypertension and                 Diabetes.  Sonographer:    Jyl Heinz Referring Phys: [id number] [place]  1. Left ventricular ejection fraction, by estimation, is 35 to 40%. The left ventricle has moderately decreased function. The left ventricle demonstrates global hypokinesis. The left ventricular internal cavity size was moderately dilated. Left ventricular diastolic parameters are consistent with Grade II diastolic dysfunction (pseudonormalization). Elevated left  ventricular end-diastolic pressure.  2. Right ventricular systolic function is moderately reduced. The right ventricular size is mildly enlarged.  3. Left atrial size was mildly dilated.  4. Right atrial size was mildly dilated.  5. The mitral valve is grossly normal. Trivial mitral valve regurgitation.  6. The aortic valve is tricuspid. Aortic valve regurgitation is not visualized. FINDINGS  Left Ventricle: Left ventricular ejection fraction, by estimation, is 35 to 40%. The left ventricle has moderately decreased function. The left ventricle demonstrates global hypokinesis. The left ventricular internal cavity size was moderately dilated. There is borderline left ventricular hypertrophy. Left ventricular diastolic parameters are consistent with Grade II diastolic dysfunction (pseudonormalization). Elevated left ventricular end-diastolic pressure. Right Ventricle: The right ventricular size is mildly enlarged. Right vetricular wall thickness was not well visualized. Right ventricular systolic function is moderately reduced. Left Atrium: Left atrial size was mildly dilated. Right Atrium: Right atrial size was mildly dilated. Pericardium: There is no evidence of pericardial effusion. Mitral Valve: The mitral valve is grossly normal. Trivial mitral valve regurgitation. Tricuspid Valve: The tricuspid valve is normal in structure. Tricuspid valve regurgitation is not demonstrated. Aortic Valve: The aortic valve is tricuspid. Aortic valve regurgitation is not visualized. Aortic valve peak gradient measures 7.0 mmHg. Pulmonic Valve: The pulmonic valve was normal in structure. Pulmonic valve regurgitation is not visualized. Aorta: The aortic root and ascending aorta are structurally normal, with no evidence of dilitation. IAS/Shunts: The atrial septum is grossly normal.  LEFT VENTRICLE PLAX 2D LVIDd:         5.80 cm      Diastology LVIDs:         4.90 cm      LV e' medial:    6.09 cm/s LV PW:         1.10 cm      LV E/e'  medial:  20.9 LV IVS:        1.10 cm      LV e' lateral:   7.51 cm/s LVOT diam:     2.00 cm      LV E/e' lateral: 16.9 LV SV:         66 LV SV Index:   30 LVOT Area:     3.14 cm  LV Volumes (MOD) LV vol d, MOD A2C: 202.0 ml LV vol d, MOD A4C: 199.0 ml LV vol s, MOD A2C: 136.0 ml LV vol s, MOD A4C: 126.0 ml LV SV MOD A2C:     66.0 ml LV SV MOD A4C:     199.0 ml LV SV MOD BP:      72.4 ml RIGHT VENTRICLE            IVC RV Basal diam:  4.00 cm    IVC diam: 2.40 cm RV Mid diam:    2.40 cm RV S prime:     7.29 cm/s TAPSE (M-mode): 1.3 cm LEFT ATRIUM             Index        RIGHT ATRIUM           Index LA diam:        4.90 cm 2.28 cm/m   RA Area:     18.40 cm LA Vol (A2C):   71.4 ml 33.23 ml/m  RA Volume:   50.20 ml  23.36 ml/m LA Vol (A4C):   83.4 ml 38.82 ml/m LA Biplane Vol: 77.9 ml 36.26 ml/m  AORTIC VALVE AV Area (Vmax): 2.51 cm AV Vmax:        132.50 cm/s AV Peak Grad:   7.0 mmHg LVOT Vmax:      106.00 cm/s LVOT Vmean:     75.750 cm/s LVOT VTI:  0.209 m  AORTA Ao Root diam: 3.10 cm Ao Asc diam:  3.10 cm MITRAL VALVE MV Area (PHT): 3.99 cm     SHUNTS MV Decel Time: 190 msec     Systemic VTI:  0.21 m MV E velocity: 127.00 cm/s  Systemic Diam: 2.00 cm MV A velocity: 97.40 cm/s MV E/A ratio:  1.30 Mertie Moores MD Electronically signed by Mertie Moores MD Signature Date/Time: 08/28/2021/3:41:16 PM    Final      TODAY-DAY OF DISCHARGE:  Subjective:   Robert Sanchez today has no headache,no chest abdominal pain,no new weakness tingling or numbness, feels much better wants to go home today.   Objective:   Blood pressure 113/86, pulse 80, temperature 97.7 F (36.5 C), temperature source Oral, resp. rate 14, height '5\' 8"'  (1.727 m), weight 93.5 kg, SpO2 92 %.  Intake/Output Summary (Last 24 hours) at 08/31/2021 1328 Last data filed at 08/31/2021 1100 Gross per 24 hour  Intake 840 ml  Output 875 ml  Net -35 ml   Filed Weights   08/29/21 0425 08/30/21 0311 08/31/21 0341  Weight: 94.5 kg  92.9 kg 93.5 kg    Exam: Awake Alert, Oriented *3, No new F.N deficits, Normal affect [place].AT,PERRAL Supple Neck,No JVD, No cervical lymphadenopathy appriciated.  Symmetrical Chest wall movement, Good air movement bilaterally, CTAB RRR,No Gallops,Rubs or new Murmurs, No Parasternal Heave +ve B.Sounds, Abd Soft, Non tender, No organomegaly appriciated, No rebound -guarding or rigidity. No Cyanosis, Clubbing or edema, No new Rash or bruise   PERTINENT RADIOLOGIC STUDIES: No results found.   PERTINENT LAB RESULTS: CBC: No results for input(s): WBC, HGB, HCT, PLT in the last 72 hours. CMET CMP     Component Value Date/Time   NA 137 08/31/2021 0317   NA 137 06/27/2020 1216   K 3.9 08/31/2021 0317   CL 104 08/31/2021 0317   CO2 26 08/31/2021 0317   GLUCOSE 123 (H) 08/31/2021 0317   BUN 28 (H) 08/31/2021 0317   BUN 17 06/27/2020 1216   CREATININE 1.34 (H) 08/31/2021 0317   CREATININE 0.81 08/14/2014 1126   CALCIUM 9.0 08/31/2021 0317   PROT 7.7 01/06/2021 0920   PROT 7.4 06/27/2020 1216   ALBUMIN 3.6 01/06/2021 0920   ALBUMIN 4.4 06/27/2020 1216   AST 16 01/06/2021 0920   ALT 18 01/06/2021 0920   ALKPHOS 74 01/06/2021 0920   BILITOT 0.8 01/06/2021 0920   BILITOT 0.4 06/27/2020 1216   GFRNONAA 60 (L) 08/31/2021 0317   GFRNONAA >89 08/14/2014 1126   GFRAA 58 (L) 08/05/2020 0524   GFRAA >89 08/14/2014 1126    GFR Estimated Creatinine Clearance: 62.6 mL/min (A) (by C-G formula based on SCr of 1.34 mg/dL (H)). No results for input(s): LIPASE, AMYLASE in the last 72 hours. No results for input(s): CKTOTAL, CKMB, CKMBINDEX, TROPONINI in the last 72 hours. Invalid input(s): POCBNP No results for input(s): DDIMER in the last 72 hours. No results for input(s): HGBA1C in the last 72 hours. No results for input(s): CHOL, HDL, LDLCALC, TRIG, CHOLHDL, LDLDIRECT in the last 72 hours. No results for input(s): TSH, T4TOTAL, T3FREE, THYROIDAB in the last 72 hours.  Invalid input(s):  FREET3 No results for input(s): VITAMINB12, FOLATE, FERRITIN, TIBC, IRON, RETICCTPCT in the last 72 hours. Coags: No results for input(s): INR in the last 72 hours.  Invalid input(s): PT Microbiology: Recent Results (from the past 240 hour(s))  Resp Panel by RT-PCR (Flu A&B, Covid) Nasopharyngeal Swab     Status: None  Collection Time: 08/28/21 12:44 AM   Specimen: Nasopharyngeal Swab; Nasopharyngeal(NP) swabs in vial transport medium  Result Value Ref Range Status   SARS Coronavirus 2 by RT PCR NEGATIVE NEGATIVE Final    Comment: (NOTE) SARS-CoV-2 target nucleic acids are NOT DETECTED.  The SARS-CoV-2 RNA is generally detectable in upper respiratory specimens during the acute phase of infection. The lowest concentration of SARS-CoV-2 viral copies this assay can detect is 138 copies/mL. A negative result does not preclude SARS-Cov-2 infection and should not be used as the sole basis for treatment or other patient management decisions. A negative result may occur with  improper specimen collection/handling, submission of specimen other than nasopharyngeal swab, presence of viral mutation(s) within the areas targeted by this assay, and inadequate number of viral copies(<138 copies/mL). A negative result must be combined with clinical observations, patient history, and epidemiological information. The expected result is Negative.  Fact Sheet for Patients:  EntrepreneurPulse.com.au  Fact Sheet for Healthcare Providers:  IncredibleEmployment.be  This test is no t yet approved or cleared by the [place] FDA and  has been authorized for detection and/or diagnosis of SARS-CoV-2 by FDA under an Emergency Use Authorization (EUA). This EUA will remain  in effect (meaning this test can be used) for the duration of the COVID-19 declaration under Section 564(b)(1) of the Act, 21 U.S.C.section 360bbb-3(b)(1), unless the authorization is terminated   or revoked sooner.       Influenza A by PCR NEGATIVE NEGATIVE Final   Influenza B by PCR NEGATIVE NEGATIVE Final    Comment: (NOTE) The Xpert Xpress SARS-CoV-2/FLU/RSV plus assay is intended as an aid in the diagnosis of influenza from Nasopharyngeal swab specimens and should not be used as a sole basis for treatment. Nasal washings and aspirates are unacceptable for Xpert Xpress SARS-CoV-2/FLU/RSV testing.  Fact Sheet for Patients: EntrepreneurPulse.com.au  Fact Sheet for Healthcare Providers: IncredibleEmployment.be  This test is not yet approved or cleared by the [place] FDA and has been authorized for detection and/or diagnosis of SARS-CoV-2 by FDA under an Emergency Use Authorization (EUA). This EUA will remain in effect (meaning this test can be used) for the duration of the COVID-19 declaration under Section 564(b)(1) of the Act, 21 U.S.C. section 360bbb-3(b)(1), unless the authorization is terminated or revoked.  Performed at [facility], [place] [address] [place], [place] [address]     FURTHER DISCHARGE INSTRUCTIONS:  Get Medicines reviewed and adjusted: Please take all your medications with you for your next visit with your Primary MD  Laboratory/radiological data: Please request your Primary MD to go over all hospital tests and procedure/radiological results at the follow up, please ask your Primary MD to get all Hospital records sent to his/her office.  In some cases, they will be blood work, cultures and biopsy results pending at the time of your discharge. Please request that your primary care M.D. goes through all the records of your hospital data and follows up on these results.  Also Note the following: If you experience worsening of your admission symptoms, develop shortness of breath, life threatening emergency, suicidal or homicidal thoughts you must seek medical attention immediately by calling 911 or  calling your MD immediately  if symptoms less severe.  You must read complete instructions/literature along with all the possible adverse reactions/side effects for all the Medicines you take and that have been prescribed to you. Take any new Medicines after you have completely understood and accpet all the possible adverse reactions/side effects.  Do not drive when taking Pain medications or sleeping medications (Benzodaizepines)  Do not take more than prescribed Pain, Sleep and Anxiety Medications. It is not advisable to combine anxiety,sleep and pain medications without talking with your primary care practitioner  Special Instructions: If you have smoked or chewed Tobacco  in the last 2 yrs please stop smoking, stop any regular Alcohol  and or any Recreational drug use.  Wear Seat belts while driving.  Please note: You were cared for by a hospitalist during your hospital stay. Once you are discharged, your primary care physician will handle any further medical issues. Please note that NO REFILLS for any discharge medications will be authorized once you are discharged, as it is imperative that you return to your primary care physician (or establish a relationship with a primary care physician if you do not have one) for your post hospital discharge needs so that they can reassess your need for medications and monitor your lab values.  Total Time spent coordinating discharge including counseling, education and face to face time equals 35 minutes.  Signed: Badr Piedra 08/31/2021 1:28 PM

## 2021-08-31 NOTE — TOC Initial Note (Addendum)
Transition of Care [facility]) - Initial/Assessment Note    Patient Details  Name: Robert Sanchez MRN: [id number] Date of Birth: 01-Apr-1956  Transition of Care [facility]) CM/SW Contact:    Elliot Cousin, RN Phone Number: [phone] 08/31/2021, 10:15 AM  Clinical Narrative:                  [facility] TOC CM spoke to pt and states he is independent at home. States he works but not sure for how long. His PCP, Dr Alvis Lemmings at [facility]. Appt scheduled for 11/21. Pt interested in [employer]. Contacted TOC pharmacy and will set up MATCH. Pt receives his Xarelto through the manufactures patient assist program. Pt states he has Xarelto at home, he recently had medication filled. States he has 6 months remaining. Explained he can utilize the [facility] for meds at a discount.   Spoke to [facility] pharmacy and meds went through under [facility]. They will bill his copay cost.      Expected Discharge Plan: Home/Self Care Barriers to Discharge: No Barriers Identified   Patient Goals and CMS Choice Patient states their goals for this hospitalization and ongoing recovery are:: plans to retire CMS Medicare.gov Compare Post Acute Care list provided to:: Patient Choice offered to / list presented to : Patient  Expected Discharge Plan and Services Expected Discharge Plan: Home/Self Care In-house Referral: Clinical Social Work Discharge Planning Services: CM Consult   Living arrangements for the past 2 months: Single Family Home Expected Discharge Date: 08/31/21                Prior Living Arrangements/Services Living arrangements for the past 2 months: Single Family Home Lives with:: Self Patient language and need for interpreter reviewed:: Yes Do you feel safe going back to the place where you live?: Yes      Need for Family Participation in Patient Care: No (Comment) Care giver support system in place?: No (comment) Current home services: DME (Rolling walker, cane) Criminal  Activity/Legal Involvement Pertinent to Current Situation/Hospitalization: No - Comment as needed  Activities of Daily Living Home Assistive Devices/Equipment: None ADL Screening (condition at time of admission) Patient's cognitive ability adequate to safely complete daily activities?: Yes Is the patient deaf or have difficulty hearing?: Yes Does the patient have difficulty seeing, even when wearing glasses/contacts?: No Does the patient have difficulty concentrating, remembering, or making decisions?: No Patient able to express need for assistance with ADLs?: No Does the patient have difficulty dressing or bathing?: No Independently performs ADLs?: Yes (appropriate for developmental age) Does the patient have difficulty walking or climbing stairs?: No Weakness of Legs: None Weakness of Arms/Hands: None  Permission Sought/Granted Permission sought to share information with : Case Manager, Family Supports, PCP Permission granted to share information with : Yes, Verbal Permission Granted  Share Information with NAME: Theodore Demark     Permission granted to share info w Relationship: friend  Permission granted to share info w Contact Information: [phone]  Emotional Assessment   Attitude/Demeanor/Rapport: Gracious Affect (typically observed): Accepting Orientation: : Oriented to Self, Oriented to Place, Oriented to  Time, Oriented to Situation Alcohol / Substance Use: Not Applicable Psych Involvement: No (comment)  Admission diagnosis:  Acute pulmonary edema (HCC) [J81.0] SOB (shortness of breath) [R06.02] CHF exacerbation (HCC) [I50.9] Patient Active Problem List   Diagnosis Date Noted   CHF exacerbation (HCC) 08/28/2021   Acute hypoxemic respiratory failure (HCC) 08/28/2021   Hyperglycemia 08/28/2021   AKI (acute  kidney injury) (HCC) 08/28/2021   Elevated troponin 08/28/2021   Hypertension associated with diabetes (HCC) 02/19/2021   Critical limb ischemia with history  of revascularization of same extremity (HCC) 01/06/2021   Acute venous embolism and thrombosis of deep vessels of proximal lower extremity (HCC) 07/16/2020   Cellulitis of foot 01/15/2020   Critical ischemia of extremity with history of revascularization of same extremity (HCC) 01/15/2020   Leukocytosis 01/15/2020   CKD (chronic kidney disease), stage III (HCC) 01/15/2020   Acute on chronic systolic CHF (congestive heart failure) (HCC) 05/04/2019   Mild renal insufficiency 05/04/2019   LFT elevation 05/04/2019   PAD (peripheral artery disease) (HCC) 08/21/2018   PVD (peripheral vascular disease) (HCC) 07/18/2018   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 [place] [address] Phone: [phone] Fax: [phone]  [facility] [address] [place] [address] Phone: [phone] Fax: [phone]  [facility] [address] [place] [address] Phone: [phone] Fax: [phone]     Social Determinants of Health (SDOH) Interventions Food Insecurity Interventions: Intervention Not Indicated Financial Strain Interventions: Intervention Not Indicated Housing Interventions: Intervention Not Indicated Transportation Interventions: Intervention Not Indicated  Readmission Risk Interventions Readmission Risk Prevention Plan 01/21/2020  Transportation Screening Complete  PCP or Specialist Appt within  5-7 Days Complete  Home Care Screening Complete  Medication Review (RN CM) Complete  Some recent data might be hidden

## 2021-08-31 NOTE — Progress Notes (Signed)
Inpatient Diabetes Program Recommendations  AACE/ADA: New Consensus Statement on Inpatient Glycemic Control (2015)  Target Ranges:  Prepandial:   less than 140 mg/dL      Peak postprandial:   less than 180 mg/dL (1-2 hours)      Critically ill patients:  140 - 180 mg/dL   Lab Results  Component Value Date   GLUCAP 150 (H) 08/31/2021   HGBA1C 13.3 (H) 08/28/2021    Inpatient Diabetes Program Recommendations:    Briefly saw patient to review insulin pen again and plan for d/c related to DM management. Patient was able to adequately describe use of insulin pen.  We reviewed goal blood sugars levels (80-130 mg/dL) and hypoglycemia signs, symptoms and treatment.  Patient was able to teach back.  Recommended that he monitor twice a day before he takes his insulin (which needs to be taken with food).  Plans to f/u at [facility]. To discharge home today.   Thanks,  Beryl Meager, RN, BC-ADM Inpatient Diabetes Coordinator Pager [phone]  (8a-5p)

## 2021-08-31 NOTE — Plan of Care (Signed)
  Problem: Elimination: Goal: Will not experience complications related to urinary retention Outcome: Completed/Met   Problem: Coping: Goal: Level of anxiety will decrease Outcome: Completed/Met   Problem: Clinical Measurements: Goal: Will remain free from infection Outcome: Completed/Met

## 2021-09-01 ENCOUNTER — Telehealth: Payer: Self-pay

## 2021-09-01 NOTE — Telephone Encounter (Signed)
Transition Care Management Unsuccessful Follow-up Telephone Call  Date of discharge and from where:  08/31/2021, [facility]   Attempts:  1st Attempt  Reason for unsuccessful TCM follow-up call:  Unable to leave message- called # [phone], voicemail full. Called # [phone], voicemail not set up.

## 2021-09-02 ENCOUNTER — Telehealth: Payer: Self-pay | Admitting: Pharmacist

## 2021-09-02 ENCOUNTER — Telehealth: Payer: Self-pay

## 2021-09-02 NOTE — Progress Notes (Incomplete)
HEART & VASCULAR TRANSITION OF CARE CONSULT NOTE     Referring Physician:Dr Ghimire Primary Care: Primary Cardiologist: Dr Doylene Canard   HPI: Referred to clinic by Dr Sloan Leiter for heart failure consultation.   Robert Sanchez is a 65 year old with a history of CAD, CABG 2019, HFrEF, DMII, CODP, and PAD.   Admitted 08/27/21 with acute hypoxemic respiratory failure and A/C HFrEF. Placed on Bipap + IV lasix. GDMT limited by hypotension.   Overall feeling fine. Denies SOB/PND/Orthopnea. Appetite ok. No fever or chills. Weight at home  pounds. Taking all medications   Cardiac Testing  08/27/21 Echo 35-40% RV moderately reduced   Review of Systems: [y] = yes, '[ ]'  = no   General: Weight gain '[ ]' ; Weight loss '[ ]' ; Anorexia '[ ]' ; Fatigue '[ ]' ; Fever '[ ]' ; Chills '[ ]' ; Weakness '[ ]'   Cardiac: Chest pain/pressure '[ ]' ; Resting SOB '[ ]' ; Exertional SOB '[ ]' ; Orthopnea '[ ]' ; Pedal Edema '[ ]' ; Palpitations '[ ]' ; Syncope '[ ]' ; Presyncope '[ ]' ; Paroxysmal nocturnal dyspnea'[ ]'   Pulmonary: Cough '[ ]' ; Wheezing'[ ]' ; Hemoptysis'[ ]' ; Sputum '[ ]' ; Snoring '[ ]'   GI: Vomiting'[ ]' ; Dysphagia'[ ]' ; Melena'[ ]' ; Hematochezia '[ ]' ; Heartburn'[ ]' ; Abdominal pain '[ ]' ; Constipation '[ ]' ; Diarrhea '[ ]' ; BRBPR '[ ]'   GU: Hematuria'[ ]' ; Dysuria '[ ]' ; Nocturia'[ ]'   Vascular: Pain in legs with walking '[ ]' ; Pain in feet with lying flat '[ ]' ; Non-healing sores '[ ]' ; Stroke '[ ]' ; TIA '[ ]' ; Slurred speech '[ ]' ;  Neuro: Headaches'[ ]' ; Vertigo'[ ]' ; Seizures'[ ]' ; Paresthesias'[ ]' ;Blurred vision '[ ]' ; Diplopia '[ ]' ; Vision changes '[ ]'   Ortho/Skin: Arthritis '[ ]' ; Joint pain '[ ]' ; Muscle pain '[ ]' ; Joint swelling '[ ]' ; Back Pain '[ ]' ; Rash '[ ]'   Psych: Depression'[ ]' ; Anxiety'[ ]'   Heme: Bleeding problems '[ ]' ; Clotting disorders '[ ]' ; Anemia '[ ]'   Endocrine: Diabetes '[ ]' ; Thyroid dysfunction'[ ]'    Past Medical History:  Diagnosis Date   Acute pulmonary edema (HCC) 06/05/2018   Arthritis    COPD (chronic obstructive pulmonary disease) (HCC)    " beginning stages " per pt    Coronary artery disease    Diabetes mellitus without complication ([place])    type 2   Diabetic neuropathy (HCC)    Diabetic neuropathy (HCC)    Emphysema/COPD ([place])    " beginning stages"   GERD (gastroesophageal reflux disease)    HLD (hyperlipidemia)    HOH (hard of hearing)    no hearing aids   Myocardial infarction [facility])    Peripheral vascular disease (HCC)     Current Outpatient Medications  Medication Sig Dispense Refill   Accu-Chek Softclix Lancets lancets Use to test blood sugars up to 4 times daily as directed. 100 each 5   aspirin 81 MG EC tablet Take 1 tablet (81 mg total) by mouth daily. Swallow whole.     atorvastatin (LIPITOR) 80 MG tablet Take 1 tablet (80 mg total) by mouth at bedtime. 30 tablet 3   Blood Glucose Monitoring Suppl (BLOOD GLUCOSE MONITOR SYSTEM) w/Device KIT Use to test blood sugars up to 4 times daily as directed. 1 kit 0   carvedilol (COREG) 6.25 MG tablet Take 1 tablet (6.25 mg total) by mouth 2 (two) times daily with a meal. 60 tablet 5   furosemide (LASIX) 40 MG tablet Take 1 tablet (40 mg total) by mouth daily. 30 tablet 5   glucose blood test strip Use to  test blood sugars up to 4 times daily as directed. 100 each 5   insulin isophane & regular human KwikPen (HUMULIN 70/30 MIX) (70-30) 100 UNIT/ML KwikPen Inject 30 units with breakfast and dinner. 18 mL 5   Insulin Pen Needle 32G X 4 MM MISC Use as directed with insulin pen 200 each 0   metFORMIN (GLUCOPHAGE) 500 MG tablet Take 1 tablet (500 mg total) by mouth 2 (two) times daily with a meal. 60 tablet 5   Multiple Vitamin (MULTIVITAMIN) tablet Take 1 tablet by mouth daily.     potassium chloride SA (KLOR-CON) 20 MEQ tablet Take 1 tablet (20 mEq total) by mouth daily. 30 tablet 5   rivaroxaban (XARELTO) 20 MG TABS tablet Take 1 tablet (20 mg total) by mouth daily with supper. 30 tablet 5   No current facility-administered medications for this visit.    Allergies  Allergen Reactions   Lisinopril  Other (See Comments)    Syncope   Codeine Rash      Social History   Socioeconomic History   Marital status: Single    Spouse name: Not on file   Number of children: 1   Years of education: Not on file   Highest education level: 10th grade  Occupational History   Not on file  Tobacco Use   Smoking status: Some Days    Packs/day: 1.00    Years: 50.00    Pack years: 50.00    Types: Cigarettes    Last attempt to quit: 06/02/2018    Years since quitting: 3.2   Smokeless tobacco: Never   Tobacco comments:    1PPD x 50 years. 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   Vaping Use   Vaping Use: Never used  Substance and Sexual Activity   Alcohol use: Yes    Comment: occasional   Drug use: Not Currently    Frequency: 1.0 times per week    Types: Marijuana   Sexual activity: Not on file  Other Topics Concern   Not on file  Social History Narrative   Not on file   Social Determinants of Health   Financial Resource Strain: Low Risk    Difficulty of Paying Living Expenses: Not very hard  Food Insecurity: No Food Insecurity   Worried About Running Out of Food in the Last Year: Never true   Ran Out of Food in the Last Year: Never true  Transportation Needs: No Transportation Needs   Lack of Transportation (Medical): No   Lack of Transportation (Non-Medical): No  Physical Activity: Not on file  Stress: Not on file  Social Connections: Not on file  Intimate Partner Violence: Not on file      Family History  Problem Relation Age of Onset   Diabetes Mother    Lung disease Mother    Cancer Father    Heart disease Father     There were no vitals filed for this visit.  PHYSICAL EXAM: General:  Well appearing. No respiratory difficulty HEENT: normal Neck: supple. no JVD. Carotids 2+ bilat; no bruits. No lymphadenopathy or thryomegaly appreciated. Cor: PMI nondisplaced. Regular rate & rhythm. No rubs, gallops or murmurs. Lungs: clear Abdomen: soft, nontender,  nondistended. No hepatosplenomegaly. No bruits or masses. Good bowel sounds. Extremities: no cyanosis, clubbing, rash, edema Neuro: alert & oriented x 3, cranial nerves grossly intact. moves all 4 extremities w/o difficulty. Affect pleasant.  ECG:   ASSESSMENT & PLAN: Chronic HFrEF, biventricular  Echo EF 35-40% RV moderately  NYHA *** GDMT  Diuretic- BB- Ace/ARB/ARNI MRA SGLT2i  2. CAD    Referred to HFSW (PCP, Medications, Transportation, ETOH Abuse, Drug Abuse, Insurance, Financial ): Yes or No Refer to Pharmacy: Yes or No Refer to Home Health: Yes on No Refer to Advanced Heart Failure Clinic: Yes or no  Refer to General Cardiology: Yes or No  Follow up

## 2021-09-02 NOTE — Telephone Encounter (Signed)
Call placed to patient. He has a question regarding his insulin pens and the supply. Explained to patient that if he takes 30 units BID of 70/30, 1 pen will last him 5 days. Therefore, 6 pens will last him 30 days. He verbalized understanding.

## 2021-09-02 NOTE — Telephone Encounter (Signed)
From the discharge call:   He said he is doing pretty good, trying to keep his sugars down. He feels better now that his blood sugars are lower than they have been.   he wants to return to work.  Tomorrow is pay day and he is uninsured. Recommended that he discuss this with cardiology tomorrow.   he said he has " whatever they gave him" and he was given everything when he was discharged but the aspirin. Instructed him to review the medication list and to call this CM back with any questions.  He stated the correct orders for insulin and metformin but was concerned about the insulin. He said he was given 6 pens and thought that would only last 5 days. He was able to state how he dials the dose of insulin but was very confused about the 100 units/ml noted on the pen.  This CM tried to explain the dosing in the pen but he still was worried that he would run out of insulin. This CM offered to have Butch Penny, [facility] contact him and he was agreeable and said he would wait for Franky Macho to call him to explain more about the insulin pens   He has a working glucometer. Last night his blood sugar was 152 today it has been running 182-283.  Instructed him to keep a log of these blood sugars. The glucometer may also have memory function.  He has not purchased a scale yet.   Scheduled to see Dr Alvis Lemmings  on 09/21/2021.  He did not want to schedule an appointment to be seen sooner

## 2021-09-02 NOTE — Telephone Encounter (Signed)
noted 

## 2021-09-02 NOTE — Telephone Encounter (Signed)
Transition Care Management Follow-up Telephone Call Date of discharge and from where: 08/31/2021, [facility]  How have you been since you were released from the hospital? He said he is doing pretty good, trying to keep his sugars down. He feels better now that his blood sugars are lower than they have been.  Any questions or concerns? Yes - he wants to return to work.  Tomorrow is pay day and he is uninsured. Recommended that he discuss this with cardiology tomorrow.   Items Reviewed: Did the pt receive and understand the discharge instructions provided?  He has the instructions but needs to review them again.  Medications obtained and verified? Yes  - he said he has " whatever they gave him" and he was given everything when he was discharged but the aspirin. Instructed him to review the medication list and to call this CM back with any questions.  He stated the correct orders for insulin and metformin but was concerned about the insulin. He said he was given 6 pens and thought that would only last 5 days. He was able to state how he dials the dose of insulin but was very confused about the 100 units/ml noted on the pen.  This CM tried to explain the dosing in the pen but he still was worried that he would run out of insulin. This CM offered to have Butch Penny, [facility] contact him and he was agreeable and said he would wait for Franky Macho to call him to explain more about the insulin pens  Other? No  Any new allergies since your discharge? No  Dietary orders reviewed? No Do you have support at home?  He lives alone and said he is fine. He has a friend to call if needed.   Home Care and Equipment/Supplies: Were home health services ordered? no If so, what is the name of the agency? N/a  Has the agency set up a time to come to the patient's home? not applicable Were any new equipment or medical supplies ordered?  No What is the name of the medical supply agency? N/a Were you able to get the  supplies/equipment? not applicable Do you have any questions related to the use of the equipment or supplies? No  He has a working glucometer. Last night his blood sugar was 152 today it has been running 182-283.  Instructed him to keep a log of these blood sugars. The glucometer may also have memory function.  He has not purchased a scale yet.   Functional Questionnaire: (I = Independent and D = Dependent) ADLs: independent but he has a walker and cane to use if needed.    Follow up appointments reviewed:  PCP Hospital f/u appt confirmed? Yes  Scheduled to see Dr Alvis Lemmings  on 09/21/2021.  He did not want to schedule an appointment to be seen sooner Specialist Hospital f/u appt confirmed? Yes  Scheduled to see Heart and Vascular on 09/03/2021. Are transportation arrangements needed? No  If their condition worsens, is the pt aware to call PCP or go to the Emergency Dept.? Yes Was the patient provided with contact information for the PCP's office or ED? Yes Was to pt encouraged to call back with questions or concerns? Yes

## 2021-09-03 ENCOUNTER — Telehealth (HOSPITAL_COMMUNITY): Payer: Self-pay

## 2021-09-03 ENCOUNTER — Encounter (HOSPITAL_COMMUNITY): Payer: Self-pay

## 2021-09-03 NOTE — Telephone Encounter (Addendum)
Heart Failure Nurse Navigator Progress Note  Attempted to call both numbers listed in chart to reschedule HV TOC appt per note from office staff regarding pt called to cancel today d/t illness. Attempted to reach pt to ascertain s/s of current illness and give reschedule date of 11/8 @ 2pm  No opportunity to leave voicemail. Unable to reach.   Will continue to reach out via the numbers listed.   Ozella Rocks, MSN, RN Heart Failure Nurse Navigator [phone]

## 2021-09-03 NOTE — Telephone Encounter (Signed)
Patient is not able to make the appointment today due to feeling ill.  Please contact to reschedule.

## 2021-09-07 ENCOUNTER — Telehealth (HOSPITAL_COMMUNITY): Payer: Self-pay

## 2021-09-07 NOTE — Telephone Encounter (Signed)
Called to confirm Heart & Vascular Transitions of Care appointment at 2pm 11/7. Patient reminded to bring all medications and pill box organizer with them. Confirmed patient has transportation. Gave directions, instructed to utilize valet parking.  Confirmed appointment prior to ending call.

## 2021-09-08 ENCOUNTER — Other Ambulatory Visit (HOSPITAL_COMMUNITY): Payer: Self-pay

## 2021-09-08 ENCOUNTER — Other Ambulatory Visit: Payer: Self-pay

## 2021-09-08 ENCOUNTER — Ambulatory Visit (HOSPITAL_COMMUNITY)
Admission: RE | Admit: 2021-09-08 | Discharge: 2021-09-08 | Disposition: A | Discharge status: Home / Self Care | Payer: Self-pay | Source: Ambulatory Visit | Attending: Cardiology | Admitting: Cardiology

## 2021-09-08 ENCOUNTER — Encounter (HOSPITAL_COMMUNITY): Payer: Self-pay

## 2021-09-08 VITALS — BP 118/68 | HR 81 | Wt 216.6 lb

## 2021-09-08 DIAGNOSIS — Z794 Long term (current) use of insulin: Secondary | ICD-10-CM | POA: Insufficient documentation

## 2021-09-08 DIAGNOSIS — I110 Hypertensive heart disease with heart failure: Secondary | ICD-10-CM | POA: Insufficient documentation

## 2021-09-08 DIAGNOSIS — I255 Ischemic cardiomyopathy: Secondary | ICD-10-CM | POA: Insufficient documentation

## 2021-09-08 DIAGNOSIS — E1165 Type 2 diabetes mellitus with hyperglycemia: Secondary | ICD-10-CM | POA: Insufficient documentation

## 2021-09-08 DIAGNOSIS — Z79899 Other long term (current) drug therapy: Secondary | ICD-10-CM | POA: Insufficient documentation

## 2021-09-08 DIAGNOSIS — I2510 Atherosclerotic heart disease of native coronary artery without angina pectoris: Secondary | ICD-10-CM | POA: Insufficient documentation

## 2021-09-08 DIAGNOSIS — Z7984 Long term (current) use of oral hypoglycemic drugs: Secondary | ICD-10-CM | POA: Insufficient documentation

## 2021-09-08 DIAGNOSIS — I5022 Chronic systolic (congestive) heart failure: Secondary | ICD-10-CM | POA: Insufficient documentation

## 2021-09-08 DIAGNOSIS — E1151 Type 2 diabetes mellitus with diabetic peripheral angiopathy without gangrene: Secondary | ICD-10-CM | POA: Insufficient documentation

## 2021-09-08 DIAGNOSIS — Z7901 Long term (current) use of anticoagulants: Secondary | ICD-10-CM | POA: Insufficient documentation

## 2021-09-08 DIAGNOSIS — Z7982 Long term (current) use of aspirin: Secondary | ICD-10-CM | POA: Insufficient documentation

## 2021-09-08 DIAGNOSIS — Z951 Presence of aortocoronary bypass graft: Secondary | ICD-10-CM | POA: Insufficient documentation

## 2021-09-08 DIAGNOSIS — F17210 Nicotine dependence, cigarettes, uncomplicated: Secondary | ICD-10-CM | POA: Insufficient documentation

## 2021-09-08 LAB — BASIC METABOLIC PANEL
Anion gap: 10 (ref 5–15)
BUN: 20 mg/dL (ref 8–23)
CO2: 25 mmol/L (ref 22–32)
Calcium: 9.4 mg/dL (ref 8.9–10.3)
Chloride: 102 mmol/L (ref 98–111)
Creatinine, Ser: 1.07 mg/dL (ref 0.61–1.24)
GFR, Estimated: >60 mL/min (ref >60)
Glucose, Bld: 97 mg/dL (ref 70–99)
Potassium: 4.4 mmol/L (ref 3.5–5.1)
Sodium: 137 mmol/L (ref 135–145)

## 2021-09-08 LAB — BRAIN NATRIURETIC PEPTIDE: B Natriuretic Peptide: 361.8 pg/mL — ABNORMAL HIGH (ref 0.0–100.0)

## 2021-09-08 MED ORDER — LOSARTAN POTASSIUM 25 MG PO TABS
12.5000 mg | ORAL_TABLET | Freq: Every day | ORAL | 3 refills | Status: DC
  Filled 2021-09-08 – 2021-09-23 (×2): qty 15, 30d supply, fill #0
  Filled 2021-10-27: qty 15, 30d supply, fill #1
  Filled 2021-12-15: qty 15, 30d supply, fill #0
  Filled 2022-02-01: qty 15, 30d supply, fill #1
  Filled 2022-03-19: qty 15, 30d supply, fill #2
  Filled 2022-04-22: qty 15, 30d supply, fill #3
  Filled 2022-05-13: qty 15, 30d supply, fill #4
  Filled 2022-08-27: qty 45, 90d supply, fill #5

## 2021-09-08 MED ORDER — SPIRONOLACTONE 25 MG PO TABS
12.5000 mg | ORAL_TABLET | Freq: Every day | ORAL | 3 refills | Status: DC
  Filled 2021-09-08 – 2021-09-23 (×2): qty 15, 30d supply, fill #0
  Filled 2021-10-27: qty 15, 30d supply, fill #1
  Filled 2021-12-15: qty 15, 30d supply, fill #0
  Filled 2022-02-01: qty 15, 30d supply, fill #1
  Filled 2022-03-19: qty 15, 30d supply, fill #2
  Filled 2022-07-08: qty 15, 30d supply, fill #3
  Filled 2022-08-27: qty 15, 30d supply, fill #4

## 2021-09-08 NOTE — Patient Instructions (Signed)
START Losartan 12.5 mg, one half tab nightly at bedtime START Spironolactone 12.5 mg, one half tab daily  Labs today We will only contact you if something comes back abnormal or we need to make some changes. Otherwise no news is good news!    Thank you for allowing [place] to provide your heart failure care after your recent hospitalization. Please follow-up in 1-2 weeks in the [facility]  Do the following things EVERYDAY: Weigh yourself in the morning before breakfast. Write it down and keep it in a log. Take your medicines as prescribed Eat low salt foods--Limit salt (sodium) to 2000 mg per day.  Stay as active as you can everyday Limit all fluids for the day to less than 2 liters

## 2021-09-08 NOTE — Progress Notes (Signed)
HEART & VASCULAR TRANSITION OF CARE CONSULT NOTE     Referring Physician:Dr Ghimire Primary Care: Charlott Rakes, MD Primary Cardiologist: Dr Doylene Canard   HPI: Referred to clinic by Dr Sloan Leiter for heart failure consultation.   Mr Robert Sanchez is a 65 year old with a history of CAD s/p CABGx 3 in 2019, HFrEF/ ischemic CM, poorly controlled DMII, CODP and severe PAD s/p multiple revascularization procedures.   Echo at time of CABG in 2019 showed LVEF 20-25%, G1DD, mod MR and normal RV. He was being followed by Dr. Doylene Canard but lost to f/u and not seen in ~3 years.   Admitted 08/27/21 with acute hypoxemic respiratory failure and A/C HFrEF. Placed on Bipap + IV lasix. Echo showed LVEF 35-40%, global HK and moderately reduced RV. Denied CP. Hs trop 48>>108>>98>>89. No cath. Diuresed w/ IV Lasix. GDMT limited by hypotension.  Discharged home on Coreg 6.25 mg bid + lasix 40 daily. D/c wt 205 lb. Referred to TOC.   Presents today for post hospital f/u. Here by himself. He lives alone w/ his cat. Works as a Development worker, community but planning to retire ~2 months given increasing inability to perform his job due to lifestyle limiting claudication from his PAD. He will not have insurance once he retires.   He reports doing fairly well from a CHF preseptive. He denies resting dyspnea and no exertional dyspnea w/ basic ADLs, though his activity is limited more so by claudication. He denies LEE. No orthopnea/ PND. His wt is charted at 216 lb today but he is wearing steal toe work boots. ReDs clip 36%. BP 118/68. No orthostatic symptoms. Denies CP.   He admitted to being noncompliant w/ medications prior to his recent hospitalization but reports improved and now full compliance post hospital.   Cardiac Testing  08/27/21 Echo 35-40% RV moderately reduced   Review of Systems: [y] = yes, _0  = no   General: Weight gain _1 ; Weight loss _2 ; Anorexia _3 ; Fatigue _4 ; Fever _5 ; Chills _6 ; Weakness _7   Cardiac: Chest  pain/pressure _8 ; Resting SOB _9 ; Exertional SOB _10 ; Orthopnea _11 ; Pedal Edema _12 ; Palpitations _13 ; Syncope _14 ; Presyncope _15 ; Paroxysmal nocturnal dyspnea_16   Pulmonary: Cough _17 ; Wheezing_18 ; Hemoptysis_19 ; Sputum _20 ; Snoring _21   GI: Vomiting_22 ; Dysphagia_23 ; Melena_24 ; Hematochezia _25 ; Heartburn_26 ; Abdominal pain _27 ; Constipation _28 ; Diarrhea _29 ; BRBPR _30   GU: Hematuria_31 ; Dysuria _32 ; Nocturia_33   Vascular: Pain in legs with walking [ Y]; Pain in feet with lying flat [Y ]; Non-healing sores _34 ; Stroke _35 ; TIA _36 ; Slurred speech _37 ;  Neuro: Headaches_38 ; Vertigo_39 ; Seizures_40 ; Paresthesias_41 ;Blurred vision _42 ; Diplopia _43 ; Vision changes _44   Ortho/Skin: Arthritis _45 ; Joint pain _46 ; Muscle pain _47 ; Joint swelling _48 ; Back Pain _49 ; Rash _50   Psych: Depression_51 ; Anxiety_52   Heme: Bleeding problems _53 ; Clotting disorders _54 ; Anemia _55   Endocrine: Diabetes [ Y]; Thyroid dysfunction_56    Past Medical History:  Diagnosis Date   Acute pulmonary edema ([place]) 06/05/2018   Arthritis    COPD (chronic obstructive pulmonary disease) ([place])    " beginning stages " per pt   Coronary artery disease    Diabetes mellitus without complication ([place])    type 2   Diabetic neuropathy ([place])  Diabetic neuropathy ([facility])    Emphysema/COPD ([place])    " beginning stages"   GERD (gastroesophageal reflux disease)    HLD (hyperlipidemia)    HOH (hard of hearing)    no hearing aids   Myocardial infarction [facility])    Peripheral vascular disease ([facility])     Current Outpatient Medications  Medication Sig Dispense Refill   Accu-Chek Softclix Lancets lancets Use to test blood sugars up to 4 times daily as directed. 100 each 5   aspirin 81 MG EC tablet Take 1 tablet (81 mg total) by mouth daily. Swallow whole.     atorvastatin (LIPITOR) 80 MG tablet Take 1 tablet (80 mg total) by mouth at bedtime. 30 tablet 3   Blood Glucose Monitoring Suppl (BLOOD GLUCOSE MONITOR SYSTEM) w/Device  KIT Use to test blood sugars up to 4 times daily as directed. 1 kit 0   carvedilol (COREG) 6.25 MG tablet Take 1 tablet (6.25 mg total) by mouth 2 (two) times daily with a meal. 60 tablet 5   furosemide (LASIX) 40 MG tablet Take 1 tablet (40 mg total) by mouth daily. 30 tablet 5   glucose blood test strip Use to test blood sugars up to 4 times daily as directed. 100 each 5   insulin isophane & regular human KwikPen (HUMULIN 70/30 MIX) (70-30) 100 UNIT/ML KwikPen Inject 30 units with breakfast and dinner. 18 mL 5   Insulin Pen Needle 32G X 4 MM MISC Use as directed with insulin pen 200 each 0   metFORMIN (GLUCOPHAGE) 500 MG tablet Take 1 tablet (500 mg total) by mouth 2 (two) times daily with a meal. 60 tablet 5   Multiple Vitamin (MULTIVITAMIN) tablet Take 1 tablet by mouth daily.     potassium chloride SA (KLOR-CON) 20 MEQ tablet Take 1 tablet (20 mEq total) by mouth daily. 30 tablet 5   rivaroxaban (XARELTO) 20 MG TABS tablet Take 1 tablet (20 mg total) by mouth daily with supper. 30 tablet 5   No current facility-administered medications for this encounter.    Allergies  Allergen Reactions   Lisinopril Other (See Comments)    Syncope   Codeine Rash      Social History   Socioeconomic History   Marital status: Single    Spouse name: Not on file   Number of children: 1   Years of education: Not on file   Highest education level: 10th grade  Occupational History   Not on file  Tobacco Use   Smoking status: Some Days    Packs/day: 1.00    Years: 50.00    Pack years: 50.00    Types: Cigarettes    Last attempt to quit: 06/02/2018    Years since quitting: 3.2   Smokeless tobacco: Never   Tobacco comments:    1PPD x 50 years. 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   Vaping Use   Vaping Use: Never used  Substance and Sexual Activity   Alcohol use: Yes    Comment: occasional   Drug use: Not Currently    Frequency: 1.0 times per week    Types: Marijuana   Sexual activity:  Not on file  Other Topics Concern   Not on file  Social History Narrative   Not on file   Social Determinants of Health   Financial Resource Strain: Low Risk    Difficulty of Paying Living Expenses: Not very hard  Food Insecurity: No Food Insecurity   Worried About Running Out of Food  in the Last Year: Never true   [place] in the Last Year: Never true  Transportation Needs: No Transportation Needs   Lack of Transportation (Medical): No   Lack of Transportation (Non-Medical): No  Physical Activity: Not on file  Stress: Not on file  Social Connections: Not on file  Intimate Partner Violence: Not on file      Family History  Problem Relation Age of Onset   Diabetes Mother    Lung disease Mother    Cancer Father    Heart disease Father     Vitals:   09/08/21 1417  BP: 118/68  Pulse: 81  SpO2: 96%  Weight: 98.2 kg (216 lb 9.6 oz)    PHYSICAL EXAM: ReDs Clip 36%  General:  disheveled appearing but pleasant. No respiratory difficulty HEENT: normal Neck: supple. no JVD. Carotids 2+ bilat; no bruits. No lymphadenopathy or thryomegaly appreciated. Cor: PMI nondisplaced. Regular rate & rhythm. No rubs, gallops or murmurs. Lungs: clear Abdomen: soft, nontender, nondistended. No hepatosplenomegaly. No bruits or masses. Good bowel sounds. Extremities: no cyanosis, clubbing, rash, edema Neuro: alert & oriented x 3, cranial nerves grossly intact. moves all 4 extremities w/o difficulty. Affect pleasant.  ECG: not performed    ASSESSMENT & PLAN:  Chronic Systolic Heart Failure  - ischemic CM - Echo 2019 EF 20-25%, RV normal. Cath w/ multivessel CAD treated w/ CABG x 3 - Echo 08/2021 EF 35-40%, RV moderately reduced - NYHA II though limited more physically by PAD/claudication - Volume status ok on exam. ReDs Clip 36% - Add Spiro 12.5 mg daily  - Add Losartan 12.5 mg qhs. If BP tolerates, will plan future transition to Entresto  - Hgb A1c too high for SGLT2i (13) -  Continue Coreg 6.25 mg bid  - Continue Lasix 40 mg daily    2. CAD - s/p CABG x 3 in 2019  - stable w/o CP  - ASA, statin + ? blocker  3. T2DM - poorly controlled, Hgb A1c 13 - managed by PCP - on insulin and metforim  4. HTN- controlled on current regimen - GDMT per above    5. PAD - s/p multiple revascularizations, on ASA + Xarelto  - followed by [facility]  6. Tobacco Abuse - smoking cessation advised   7. SDOH  - lives alone. Has reliable transportation. Planning to retire soon due to lifestyle limiting claudication and will be uninsured and will need help w/ medications. I have asked SW to meet w/ patient today to discuss securing insurance and assist w/ patient assistance. SW will also scree for additional needs.   Echo EF 35-40% RV moderately  NYHA II GDMT Diuretic- Lasix 40 mg daily  BB- Coreg 6.25 mg bid  Ace/ARB/ARNI Start Losartan 12.5 mg qhs  MRA- Start Spiro 12.5 mg daily  SGLT2i - Not yet. Hgb A1c is too high (13)     Referred to HFSW (PCP, Medications, Transportation, ETOH Abuse, Drug Abuse, Insurance, Financial ): Yes or No Refer to Pharmacy: Yes or No Refer to Home Health: Yes on No Refer to Advanced Heart Failure Clinic: Yes or no  Refer to General Cardiology: Yes or No  Follow up  in [facility] clinic in 1 week

## 2021-09-08 NOTE — Progress Notes (Signed)
Heart and Vascular Care Navigation  09/08/2021  Robert Sanchez September 22, 1956 [id number]  Reason for Referral: Patient seen in HF [facility]   Engaged with patient face to face for initial visit for Heart and Vascular Care Coordination.                                                                                                   Assessment:    Patient is a 65 yo male who lives alone in a single family home. 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 He reports he has a short term disability although the insurance offered is not affordable. He has been treated for  PVD at [facility] for a 3 years now and he states "it's only a matter of time before they will have to cut off my leg". Patient is adamant about continuing to work  as he states he would have to wait a few months before collecting early retirement.                               HRT/VAS Care Coordination     Patients Home Cardiology Office --  [facility]   Outpatient Care Team Social Worker   Social Worker Name: Lasandra Beech, [place] [phone]   Living arrangements for the past 2 months Single Family Home   Lives with: Self   Patient Current Insurance Coverage Self-Pay   Patient Has Concern With Paying Medical Bills Yes   Medical Bill Referrals: CAFA   Does Patient Have Prescription Coverage? No   Home Assistive Devices/Equipment None   DME Agency NA   HH Agency NA   Current home services DME  Rolling walker, cane       Social History:                                                                             SDOH Screenings   Alcohol Screen: Low Risk    Last Alcohol Screening Score (AUDIT): 0  Depression (PHQ2-9): Low Risk    PHQ-2 Score: 2  Financial Resource Strain: Low Risk    Difficulty of Paying Living Expenses: Not very hard  Food Insecurity: No Food Insecurity   Worried About Programme researcher, broadcasting/film/video in the Last Year: Never true   Ran Out of Food in the Last Year:  Never true  Housing: Low Risk    Last Housing Risk Score: 0  Physical Activity: Not on file  Social Connections: Not on file  Stress: Not on file  Tobacco Use: High Risk   Smoking Tobacco Use: Some Days   Smokeless Tobacco Use: Never   Passive Exposure: Not on file  Transportation Needs: No Transportation  Needs   Lack of Transportation (Medical): No   Lack of Transportation (Non-Medical): No    SDOH Interventions: Financial Resources:    Discussed and provided CAFA application and provided information on SSD should he require amputation of his foot.  Food Insecurity:   N/a  Housing Insecurity:   N/a  Transportation:    N/a   Health Promotion Interventions:     Physical Inactivity N/a  Smoking Cessation CSW offered smoking cessation although patient denies  Dietary Concerns N/a  Health Coaching Patient denies   Follow-up plan:  CSW provided patient with a CAFA application and needed documents and asked patient to bring back on next visit to the  [facility] clinic. Patient states he has difficulty gathering the documents as he has to work and "don't always have time to get it together". CSW stressed the importance and the advantages of getting CAFA to assist with medical bills as well as the opportunity to have pharmacy assistance at the [facility]. Patient verbalize understanding of follow needed and will return next week to HF TOC. CSW continues to follow for support and CAFA follow up. Lasandra Beech, LCSW, CCSW-MCS [phone]

## 2021-09-08 NOTE — Progress Notes (Signed)
ReDS Vest / Clip - 09/08/21 1400       ReDS Vest / Clip   Station Marker D    Ruler Value 37    ReDS Value Range Moderate volume overload    ReDS Actual Value 36

## 2021-09-10 ENCOUNTER — Telehealth (HOSPITAL_COMMUNITY): Payer: Self-pay | Admitting: Licensed Clinical Social Worker

## 2021-09-10 NOTE — Telephone Encounter (Signed)
CSW contacted Robyne Peers, RN Care Manger at [facility] to collaborate on patient care. Erskine Squibb was able to obtain financial counseling appointment for patient at 11-21 at 10:30 after his PCP visit at [facility]. CSW attempted to contact patient to inform of financial counseling appointment although unable to leave a message.  CSW will follow up with patient at next [facility] appointment. Lasandra Beech, LCSW, CCSW-MCS [phone]

## 2021-09-14 ENCOUNTER — Emergency Department (HOSPITAL_COMMUNITY)
Admission: EM | Admit: 2021-09-14 | Discharge: 2021-09-14 | Disposition: A | Discharge status: Home / Self Care | Payer: Self-pay | Attending: Emergency Medicine | Admitting: Emergency Medicine

## 2021-09-14 ENCOUNTER — Emergency Department (HOSPITAL_COMMUNITY): Payer: Self-pay

## 2021-09-14 ENCOUNTER — Telehealth (HOSPITAL_COMMUNITY): Payer: Self-pay

## 2021-09-14 DIAGNOSIS — L539 Erythematous condition, unspecified: Secondary | ICD-10-CM | POA: Insufficient documentation

## 2021-09-14 DIAGNOSIS — F17210 Nicotine dependence, cigarettes, uncomplicated: Secondary | ICD-10-CM | POA: Insufficient documentation

## 2021-09-14 DIAGNOSIS — I2510 Atherosclerotic heart disease of native coronary artery without angina pectoris: Secondary | ICD-10-CM | POA: Insufficient documentation

## 2021-09-14 DIAGNOSIS — R52 Pain, unspecified: Secondary | ICD-10-CM

## 2021-09-14 DIAGNOSIS — Z79899 Other long term (current) drug therapy: Secondary | ICD-10-CM | POA: Insufficient documentation

## 2021-09-14 DIAGNOSIS — E1110 Type 2 diabetes mellitus with ketoacidosis without coma: Secondary | ICD-10-CM | POA: Insufficient documentation

## 2021-09-14 DIAGNOSIS — J449 Chronic obstructive pulmonary disease, unspecified: Secondary | ICD-10-CM | POA: Insufficient documentation

## 2021-09-14 DIAGNOSIS — I130 Hypertensive heart and chronic kidney disease with heart failure and stage 1 through stage 4 chronic kidney disease, or unspecified chronic kidney disease: Secondary | ICD-10-CM | POA: Insufficient documentation

## 2021-09-14 DIAGNOSIS — Z794 Long term (current) use of insulin: Secondary | ICD-10-CM | POA: Insufficient documentation

## 2021-09-14 DIAGNOSIS — M79672 Pain in left foot: Secondary | ICD-10-CM

## 2021-09-14 DIAGNOSIS — N183 Chronic kidney disease, stage 3 unspecified: Secondary | ICD-10-CM | POA: Insufficient documentation

## 2021-09-14 DIAGNOSIS — E1122 Type 2 diabetes mellitus with diabetic chronic kidney disease: Secondary | ICD-10-CM | POA: Insufficient documentation

## 2021-09-14 DIAGNOSIS — I5023 Acute on chronic systolic (congestive) heart failure: Secondary | ICD-10-CM | POA: Insufficient documentation

## 2021-09-14 DIAGNOSIS — Z7901 Long term (current) use of anticoagulants: Secondary | ICD-10-CM | POA: Insufficient documentation

## 2021-09-14 DIAGNOSIS — Z7984 Long term (current) use of oral hypoglycemic drugs: Secondary | ICD-10-CM | POA: Insufficient documentation

## 2021-09-14 DIAGNOSIS — Z951 Presence of aortocoronary bypass graft: Secondary | ICD-10-CM | POA: Insufficient documentation

## 2021-09-14 DIAGNOSIS — Z7982 Long term (current) use of aspirin: Secondary | ICD-10-CM | POA: Insufficient documentation

## 2021-09-14 DIAGNOSIS — E1151 Type 2 diabetes mellitus with diabetic peripheral angiopathy without gangrene: Secondary | ICD-10-CM | POA: Insufficient documentation

## 2021-09-14 LAB — BASIC METABOLIC PANEL
Anion gap: 13 (ref 5–15)
BUN: 22 mg/dL (ref 8–23)
CO2: 23 mmol/L (ref 22–32)
Calcium: 9.2 mg/dL (ref 8.9–10.3)
Chloride: 100 mmol/L (ref 98–111)
Creatinine, Ser: 1.33 mg/dL — ABNORMAL HIGH (ref 0.61–1.24)
GFR, Estimated: >60 mL/min (ref >60)
Glucose, Bld: 176 mg/dL — ABNORMAL HIGH (ref 70–99)
Potassium: 4.2 mmol/L (ref 3.5–5.1)
Sodium: 136 mmol/L (ref 135–145)

## 2021-09-14 LAB — CBC
HCT: 39.1 % (ref 39.0–52.0)
Hemoglobin: 12.4 g/dL — ABNORMAL LOW (ref 13.0–17.0)
MCH: 29.2 pg (ref 26.0–34.0)
MCHC: 31.7 g/dL (ref 30.0–36.0)
MCV: 92.2 fL (ref 80.0–100.0)
Platelets: 401 10*3/uL — ABNORMAL HIGH (ref 150–400)
RBC: 4.24 MIL/uL (ref 4.22–5.81)
RDW: 13.6 % (ref 11.5–15.5)
WBC: 11.2 10*3/uL — ABNORMAL HIGH (ref 4.0–10.5)
nRBC: 0 % (ref 0.0–0.2)

## 2021-09-14 MED ORDER — DOXYCYCLINE HYCLATE 100 MG PO TABS
100.0000 mg | ORAL_TABLET | Freq: Once | ORAL | Status: AC
  Administered 2021-09-14: 100 mg via ORAL
  Filled 2021-09-14: qty 1

## 2021-09-14 MED ORDER — DOXYCYCLINE HYCLATE 100 MG PO CAPS: 100.0000 mg | ORAL_CAPSULE | Freq: Two times a day (BID) | ORAL | 0 refills | Status: AC

## 2021-09-14 NOTE — ED Provider Notes (Signed)
Emergency Medicine Provider Triage Evaluation Note  Robert Sanchez , a 65 y.o. male  was evaluated in triage.  Pt complains of left leg pain ongoing intermittently but today persistently. Hx of severe Pad w stent sees vascular  Review of Systems  Positive: Leg pain Negative: Fever   Physical Exam  BP 123/69   Pulse 77   Temp 97.9 F (36.6 C) (Oral)   Resp (!) 22   SpO2 97%  Gen:   Awake, no distress   Resp:  Normal effort  MSK:   Moves extremities without difficulty  Other:  Left DP PT pulses difficult to palpate.  Dorsalis pedis with dopplerable pulse.  Confirmed flow.  Medical Decision Making  Medically screening exam initiated at 11:22 AM.  Appropriate orders placed.  Robert Sanchez was informed that the remainder of the evaluation will be completed by another provider, this initial triage assessment does not replace that evaluation, and the importance of remaining in the ED until their evaluation is complete.  Patient with Doppler confirmed biphasic pulse.  Left leg is not acutely ischemic however seems to have symptoms consistent with decreased flow per patient.   Solon Augusta [place], [place] 09/14/21 1125    Milagros Loll, MD 09/15/21 2240

## 2021-09-14 NOTE — ED Provider Notes (Signed)
[facility] EMERGENCY DEPARTMENT Provider Note   CSN: [id number] Arrival date & time: 09/14/21  1009     History Chief Complaint  Patient presents with   Foot Pain    Robert Sanchez is a 65 y.o. male with history of peripheral vascular disease, bypass of the left lower extremity, thrombolytic therapy for critical ischemia left lower extremity, on Xarelto chronically, presenting to the ED with concern for discoloration in his left foot.  The patient reports that earlier this week he noted that the foot seemed "to turn red" and now has gone back to its normal color.  He says he has significant neuropathy in his feet, but denies any new pain or weakness in the foot.  He can feel gross touch only.  He denies any fevers or chills.  He is concerned that the skin on the top of sweat appears slightly more reddened than normal, and that this could be consistent with an infection.  He has been compliant with all of his medication including his insulin and Xarelto.  His vascular surgeon is Dr Monica Martinez at [facility]  HPI     Past Medical History:  Diagnosis Date   Acute pulmonary edema ([place]) 06/05/2018   Arthritis    COPD (chronic obstructive pulmonary disease) ([place])    " beginning stages " per pt   Coronary artery disease    Diabetes mellitus without complication ([place])    type 2   Diabetic neuropathy (H[facility])    Diabetic neuropathy ([place])    Emphysema/COPD ([place])    " beginning stages"   GERD (gastroesophageal reflux disease)    HLD (hyperlipidemia)    HOH (hard of hearing)    no hearing aids   Myocardial infarction [facility])    Peripheral vascular disease ([place])     Patient Active Problem List   Diagnosis Date Noted   CHF exacerbation ([place]) 08/28/2021   Acute hypoxemic respiratory failure ([place]) 08/28/2021   Hyperglycemia 08/28/2021   AKI (acute kidney injury) ([place]) 08/28/2021   Elevated troponin 08/28/2021   Hypertension associated with diabetes ([place]) 02/19/2021    Critical limb ischemia with history of revascularization of same extremity ([place]) 01/06/2021   Acute venous embolism and thrombosis of deep vessels of proximal lower extremity ([place]) 07/16/2020   Cellulitis of foot 01/15/2020   Critical ischemia of extremity with history of revascularization of same extremity ([place]) 01/15/2020   Leukocytosis 01/15/2020   CKD (chronic kidney disease), stage III ([place]) 01/15/2020   Acute on chronic systolic CHF (congestive heart failure) ([place]) 05/04/2019   Mild renal insufficiency 05/04/2019   LFT elevation 05/04/2019   PAD (peripheral artery disease) ([place]) 08/21/2018   PVD (peripheral vascular disease) ([place]) 07/18/2018   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   CAD (coronary artery disease) 06/05/2018   Pulmonary edema 06/03/2018   DKA, type 2 ([place]) 06/03/2018   Diabetic acidosis without coma ([place])    Sebaceous cyst 08/23/2016   Acute pain of right knee 08/23/2016   Hidradenitis 08/23/2016   Right hip pain 08/14/2014   Diabetes mellitus type II, non insulin dependent ([place]) 11/27/2013   Arthritis 11/27/2013   Neuropathy in diabetes ([place]) 11/27/2013    Past Surgical History:  Procedure Laterality Date   ABDOMINAL AORTOGRAM W/LOWER EXTREMITY N/A 06/21/2018   Procedure: ABDOMINAL AORTOGRAM W/LOWER EXTREMITY;  Surgeon: [name], MD;  Location: [place];  Service: Cardiovascular;  Laterality: N/A;   ABDOMINAL AORTOGRAM W/LOWER EXTREMITY Left 01/16/2020   Procedure: ABDOMINAL AORTOGRAM  W/LOWER EXTREMITY;  Surgeon: [name], MD;  Location: [place] [facility];  Service: Cardiovascular;  Laterality: Left;   ABDOMINAL AORTOGRAM W/LOWER EXTREMITY N/A 07/09/2020   Procedure: ABDOMINAL AORTOGRAM W/LOWER EXTREMITY;  Surgeon: [name], MD;  Location: [place] [facility];  Service: Cardiovascular;  Laterality: N/A;  TPA infusion   AMPUTATION Left 08/04/2020   Procedure: LEFT PARTIAL GREAT TOE AMPUTATION;  Surgeon: [name], MD;   Location: [place];  Service: Vascular;  Laterality: Left;   APPLICATION OF WOUND VAC Left 08/04/2020   Procedure: LEFT GROIN DEBRIDEMENT WITH APPLICATION OF WOUND [place];  Surgeon: [name], MD;  Location: [place];  Service: Vascular;  Laterality: Left;   CORONARY ARTERY BYPASS GRAFT N/A 06/12/2018   Procedure: CORONARY ARTERY BYPASS GRAFTING (CABG) x 3 WITH ENDOSCOPIC HARVESTING OF RIGHT GREATER SAPHENOUS VEIN. LIMA TO LAD. SVG TO PD. SVG TO DIAGONAL.;  Surgeon: Ivin Poot, MD;  Location: [place];  Service: Open Heart Surgery;  Laterality: N/A;   FEMORAL-POPLITEAL BYPASS GRAFT Left 07/10/2020   Procedure: REDO EXPOSURE OF LEFT COMMON FEMORAL ARTERY LEFT COMMON FEMORAL TO POSTERIOR TIBIAL COMPOSITE BYPASS GRAFT;  Surgeon: [name], MD;  Location: [place];  Service: Vascular;  Laterality: Left;   FEMORAL-TIBIAL BYPASS GRAFT Left 08/21/2018   Procedure: BYPASS GRAFT LEFT FEMORAL TO POSTERIOR TIBIAL ARTERY USING LEFT REVERSED GREAT SAPHENOUS VEIN;  Surgeon: [name], MD;  Location: [facility];  Service: Vascular;  Laterality: Left;   FEMORAL-TIBIAL BYPASS GRAFT Left 08/04/2020   Procedure: REVISION FEMORAL-DISTAL BYPASS WITH BIOLOGICAL BOVINE PATCH;  Surgeon: [name], MD;  Location: [employer];  Service: Vascular;  Laterality: Left;   KNEE ARTHROSCOPY     LOWER EXTREMITY ANGIOGRAM Left 08/04/2020   Procedure: LEFT ILIAC ANGIOGRAM, ANGIOPLASTY OF LEFT ILIAC STENT WITH DRUG COATED BALLOON;  Surgeon: [name], MD;  Location: [place];  Service: Vascular;  Laterality: Left;   LOWER EXTREMITY ANGIOGRAPHY Left 01/17/2020   Procedure: Lower Extremity Angiography;  Surgeon: [name], MD;  Location: [place] [facility];  Service: Cardiovascular;  Laterality: Left;   LOWER EXTREMITY ANGIOGRAPHY N/A 01/07/2021   Procedure: LOWER EXTREMITY ANGIOGRAPHY;  Surgeon: Cherre Robins, MD;  Location: [place] [facility];  Service: Cardiovascular;  Laterality: N/A;    PERIPHERAL VASCULAR BALLOON ANGIOPLASTY Left 01/17/2020   Procedure: PERIPHERAL VASCULAR BALLOON ANGIOPLASTY;  Surgeon: [name], MD;  Location: [place] [facility];  Service: Cardiovascular;  Laterality: Left;  pt   PERIPHERAL VASCULAR INTERVENTION Left 06/21/2018   Procedure: PERIPHERAL VASCULAR INTERVENTION;  Surgeon: [name], MD;  Location: [place] [facility];  Service: Cardiovascular;  Laterality: Left;   PERIPHERAL VASCULAR INTERVENTION Left 01/08/2021   Procedure: PERIPHERAL VASCULAR INTERVENTION;  Surgeon: [name], MD;  Location: [place] [facility];  Service: Cardiovascular;  Laterality: Left;   PERIPHERAL VASCULAR THROMBECTOMY Left 01/16/2020   Procedure: PERIPHERAL VASCULAR THROMBECTOMY;  Surgeon: [name], MD;  Location: [place] [facility];  Service: Cardiovascular;  Laterality: Left;  Lytic Catheter Placement left fem-pop bypass   PERIPHERAL VASCULAR THROMBECTOMY Left 01/17/2020   Procedure: PERIPHERAL VASCULAR THROMBECTOMY;  Surgeon: [name], MD;  Location: [place] [facility];  Service: Cardiovascular;  Laterality: Left;  fem-pt bypass   PERIPHERAL VASCULAR THROMBECTOMY Left 07/10/2020   Procedure: lysis recheck;  Surgeon: [name], MD;  Location: [place] [facility];  Service: Cardiovascular;  Laterality: Left;   PULMONARY THROMBECTOMY N/A  01/08/2021   Procedure: LYSIS RECHECK;  Surgeon: [name], MD;  Location: [place] [facility];  Service: Cardiovascular;  Laterality: N/A;   RIGHT/LEFT HEART CATH AND CORONARY ANGIOGRAPHY N/A 06/05/2018   Procedure: RIGHT/LEFT HEART CATH AND CORONARY ANGIOGRAPHY;  Surgeon: Dixie Dials, MD;  Location: [place] [facility];  Service: Cardiovascular;  Laterality: N/A;   SPINE SURGERY     TEE WITHOUT CARDIOVERSION N/A 06/12/2018   Procedure: TRANSESOPHAGEAL ECHOCARDIOGRAM (TEE);  Surgeon: Prescott Gum, Collier [place], MD;  Location: [place];  Service: Open Heart Surgery;  Laterality: N/A;    teeth extractions     THROMBECTOMY FEMORAL ARTERY Left 08/04/2020   Procedure: THROMBECTOMY OF LEFT FEMORAL TP COMPOSTITE BYPASS;  Surgeon: [name], MD;  Location: [facility];  Service: Vascular;  Laterality: Left;   THROMBECTOMY ILIAC ARTERY Left 07/10/2020   Procedure: THROMBECTOMY OF LEFT EXTERNAL ILIAC AND LEFT COMMON FEMORAL AND LEFT POSTERIOR TIBIAL ARTERIES;  Surgeon: [name], MD;  Location: [facility];  Service: Vascular;  Laterality: Left;   VEIN HARVEST Left 08/21/2018   Procedure: VEIN HARVEST LEFT GREAT SAPHENOUS VEIN;  Surgeon: [name], MD;  [facility];  Service: Vascular;  Laterality: Left;       Family History  Problem Relation Age of Onset   Diabetes Mother    Lung disease Mother    Cancer Father    Heart disease Father     Social History   Tobacco Use   Smoking status: Some Days    Packs/day: 1.00    Years: 50.00    Pack years: 50.00    Types: Cigarettes    Last attempt to quit: 06/02/2018    Years since quitting: 3.2   Smokeless tobacco: Never   Tobacco comments:    1PPD x 50 years. 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   Vaping Use   Vaping Use: Never used  Substance Use Topics   Alcohol use: Yes    Comment: occasional   Drug use: Not Currently    Frequency: 1.0 times per week    Types: Marijuana    Home Medications Prior to Admission medications   Medication Sig Start Date End Date Taking? Authorizing Provider  aspirin 81 MG EC tablet Take 1 tablet (81 mg total) by mouth daily. Swallow whole. 08/31/21  Yes Ghimire, Henreitta Leber, MD  atorvastatin (LIPITOR) 80 MG tablet Take 1 tablet (80 mg total) by mouth at bedtime. 08/31/21 08/31/22 Yes Ghimire, Henreitta Leber, MD  carvedilol (COREG) 6.25 MG tablet Take 1 tablet (6.25 mg total) by mouth 2 (two) times daily with a meal. 08/31/21  Yes Ghimire, Henreitta Leber, MD  doxycycline (VIBRAMYCIN) 100 MG capsule Take 1 capsule (100 mg total) by mouth 2 (two) times daily for 7 days. 09/14/21  09/21/21 Yes Youssouf Shipley, Carola Rhine, MD  furosemide (LASIX) 40 MG tablet Take 1 tablet (40 mg total) by mouth daily. 08/31/21  Yes Ghimire, Henreitta Leber, MD  insulin isophane & regular human KwikPen (HUMULIN 70/30 MIX) (70-30) 100 UNIT/ML KwikPen Inject 30 units with breakfast and dinner. 08/31/21  Yes Ghimire, Henreitta Leber, MD  metFORMIN (GLUCOPHAGE) 500 MG tablet Take 1 tablet (500 mg total) by mouth 2 (two) times daily with a meal. 08/31/21  Yes Ghimire, Henreitta Leber, MD  Multiple Vitamin (MULTIVITAMIN) tablet Take 1 tablet by mouth daily.   Yes [provider]  potassium chloride SA (KLOR-CON) 20 MEQ tablet Take 1 tablet (20 mEq total) by mouth daily. 08/31/21  Yes Ghimire,  Henreitta Leber, MD  rivaroxaban (XARELTO) 20 MG TABS tablet Take 1 tablet (20 mg total) by mouth daily with supper. 08/31/21 08/31/22 Yes Ghimire, Henreitta Leber, MD  Accu-Chek Softclix Lancets lancets Use to test blood sugars up to 4 times daily as directed. 08/31/21   Ghimire, Henreitta Leber, MD  Blood Glucose Monitoring Suppl (BLOOD GLUCOSE MONITOR SYSTEM) w/Device KIT Use to test blood sugars up to 4 times daily as directed. 08/31/21   Ghimire, Henreitta Leber, MD  glucose blood test strip Use to test blood sugars up to 4 times daily as directed. 08/31/21   Ghimire, Henreitta Leber, MD  Insulin Pen Needle 32G X 4 MM MISC Use as directed with insulin pen 08/31/21   Ghimire, Henreitta Leber, MD  losartan (COZAAR) 25 MG tablet Take 0.5 tablets (12.5 mg total) by mouth at bedtime. 09/08/21 12/07/21  Consuelo Pandy, PA-C  spironolactone (ALDACTONE) 25 MG tablet Take 0.5 tablets (12.5 mg total) by mouth daily. 09/08/21 12/07/21  Consuelo Pandy, PA-C    Allergies    Lisinopril and Codeine  Review of Systems   Review of Systems  Constitutional:  Negative for chills and fever.  Respiratory:  Negative for cough and shortness of breath.   Cardiovascular:  Negative for chest pain and palpitations.  Gastrointestinal:  Negative for abdominal pain and vomiting.   Musculoskeletal:  Negative for arthralgias and myalgias.  Skin:  Positive for rash. Negative for color change.  Neurological:  Negative for weakness and numbness.  All other systems reviewed and are negative.  Physical Exam Updated Vital Signs BP 121/89   Pulse 64   Temp 98.6 F (37 C)   Resp 16   SpO2 100%   Physical Exam Constitutional:      General: He is not in acute distress. HENT:     Head: Normocephalic and atraumatic.  Eyes:     Conjunctiva/sclera: Conjunctivae normal.     Pupils: Pupils are equal, round, and reactive to light.  Cardiovascular:     Rate and Rhythm: Normal rate and regular rhythm.     Comments: Doppler pedal pulse present bilaterally Pulmonary:     Effort: Pulmonary effort is normal. No respiratory distress.  Abdominal:     General: There is no distension.     Tenderness: There is no abdominal tenderness.  Musculoskeletal:     Comments: Foot is warm, L>R, with mild overlying redness of dorsum of left foot, no crepitus 3 second cap refill toes bilaterally  Skin:    General: Skin is warm and dry.  Neurological:     General: No focal deficit present.     Mental Status: He is alert. Mental status is at baseline.  Psychiatric:        Mood and Affect: Mood normal.        Behavior: Behavior normal.      ED Results / Procedures / Treatments   Labs (all labs ordered are listed, but only abnormal results are displayed) Labs Reviewed  BASIC METABOLIC PANEL - Abnormal; Notable for the following components:      Result Value   Glucose, Bld 176 (*)    Creatinine, Ser 1.33 (*)    All other components within normal limits  CBC - Abnormal; Notable for the following components:   WBC 11.2 (*)    Hemoglobin 12.4 (*)    Platelets 401 (*)    All other components within normal limits    EKG None  Radiology DG Foot Complete Left  Result  Date: 09/14/2021 CLINICAL DATA:  Left foot pain for several years, history of diabetes and partial amputation  EXAM: LEFT FOOT - COMPLETE 3+ VIEW COMPARISON:  None. FINDINGS: The great toe distal phalanx has been partially amputated. The remainder of the bones are normal in appearance, with no acute fracture or dislocation. There is no evidence of osseous destruction. Alignment is normal. The Lisfranc and Chopart joints are intact. There is mild diffuse soft tissue edema. There is no soft tissue gas or radiopaque foreign body. IMPRESSION: 1. Expected postsurgical changes reflecting partial amputation of the great toe. No evidence of acute osseous abnormality. 2. Mild diffuse soft tissue edema. Electronically Signed   By: Valetta Mole M.D.   On: 09/14/2021 12:14    Procedures Procedures   Medications Ordered in ED Medications  doxycycline (VIBRA-TABS) tablet 100 mg (100 mg Oral Given 09/14/21 1732)    ED Course  I have reviewed the triage vital signs and the nursing notes.  Pertinent labs & imaging results that were available during my care of the patient were reviewed by me and considered in my medical decision making (see chart for details).  Patient is here complaining of foot discoloration which was intermittent over the past week.  He does have dopplerable pulses.  His foot is warm with cap refills.  This is not consistent with new or critical limb ischemia.  His pain is also extremely minimal.  It is possible this is an early infection as there is some redness overlying the dorsum of the foot.  We can start him on doxycycline.  The patient is in agreement with this plan.  We will give him his first dose in the ER.  I also spoke to his vascular surgeon Dr. Carlis Abbott by phone, who will reach out to his office to arrange follow-up.    Final Clinical Impression(s) / ED Diagnoses Final diagnoses:  Foot pain, left    Rx / DC Orders ED Discharge Orders          Ordered    doxycycline (VIBRAMYCIN) 100 MG capsule  2 times daily        09/14/21 1725             Wyvonnia Dusky, MD 09/15/21  1306

## 2021-09-14 NOTE — ED Triage Notes (Addendum)
Pt here POV d/t foot pain. Pt has vascular issues. Left great toe amputation. Foot cool to the touch. Red in color.  Pulse intact. Pt A/O X4

## 2021-09-14 NOTE — Discharge Instructions (Addendum)
We started you on an antibiotic called doxycycline for the next 7 days to prevent infection in your foot.  I spoke to your vascular surgeon Dr. Chestine Spore who will have his office reach out to you to arrange follow up.  If you do not hear from them in 2 business days, please call the number above to ask for a follow-up appointment.  If you notice redness spreading up your leg, have new or worsening pain in your leg, or noticed that the skin in your foot or toes turns purple, blue, or black, please call 911 and come back to the ER immediately.  These may be signs of worsening infection or new blockage in your blood.

## 2021-09-14 NOTE — ED Notes (Signed)
Pt verbalizes understanding of discharge instructions. Opportunity for questions and answers were provided. Pt discharged from the ED.   ?

## 2021-09-14 NOTE — Telephone Encounter (Addendum)
Heart Failure Nurse Navigator Progress Note  Attempted to call pt for HV TOC reminder. No answer, no other numbers listed. No opportunity to leave vm.  Noted pt in ED currently. Will follow through hospitalization/ED course.  Ozella Rocks, MSN, RN Heart Failure Nurse Navigator [phone]

## 2021-09-15 ENCOUNTER — Ambulatory Visit (HOSPITAL_COMMUNITY): Payer: Self-pay

## 2021-09-15 ENCOUNTER — Other Ambulatory Visit: Payer: Self-pay

## 2021-09-16 ENCOUNTER — Other Ambulatory Visit: Payer: Self-pay

## 2021-09-16 DIAGNOSIS — I739 Peripheral vascular disease, unspecified: Secondary | ICD-10-CM

## 2021-09-16 NOTE — Addendum Note (Signed)
Addended byWaynetta Pean on: 09/16/2021 11:16 AM   Modules accepted: Orders

## 2021-09-21 ENCOUNTER — Inpatient Hospital Stay: Payer: Self-pay | Admitting: Family Medicine

## 2021-09-21 ENCOUNTER — Encounter: Payer: Self-pay | Admitting: Family Medicine

## 2021-09-21 ENCOUNTER — Ambulatory Visit: Payer: Self-pay | Attending: Family Medicine | Admitting: Family Medicine

## 2021-09-21 ENCOUNTER — Other Ambulatory Visit: Payer: Self-pay

## 2021-09-21 ENCOUNTER — Ambulatory Visit: Payer: Self-pay

## 2021-09-21 ENCOUNTER — Telehealth (HOSPITAL_COMMUNITY): Payer: Self-pay

## 2021-09-21 DIAGNOSIS — E1165 Type 2 diabetes mellitus with hyperglycemia: Secondary | ICD-10-CM

## 2021-09-21 DIAGNOSIS — I739 Peripheral vascular disease, unspecified: Secondary | ICD-10-CM

## 2021-09-21 NOTE — Telephone Encounter (Signed)
Pt. Calling to report he was seen in ED last week with left foot pain. States he has noticed his blood glucose "running low mainly in the evening. 90-160. In the morning 200-225. Should I keep taking 30 units twice a day?" States he has been watching his diet.Please advise pt.   Answer Assessment - Initial Assessment Questions 1. ONSET: "When did the pain start?"      09/14/21 2. LOCATION: "Where is the pain located?"      Left foot 3. PAIN: "How bad is the pain?"    (Scale 1-10; or mild, moderate, severe)  - MILD (1-3): doesn't interfere with normal activities.   - MODERATE (4-7): interferes with normal activities (e.g., work or school) or awakens from sleep, limping.   - SEVERE (8-10): excruciating pain, unable to do any normal activities, unable to walk.      8 4. WORK OR EXERCISE: "Has there been any recent work or exercise that involved this part of the body?"      No 5. CAUSE: "What do you think is causing the foot pain?"     Circulation 6. OTHER SYMPTOMS: "Do you have any other symptoms?" (e.g., leg pain, rash, fever, numbness)     Red 7. PREGNANCY: "Is there any chance you are pregnant?" "When was your last menstrual period?"     N/a  Protocols used: Foot Pain-A-AH

## 2021-09-21 NOTE — Telephone Encounter (Signed)
Schedule appt for patient today.  States he is unable to come in. Has leg pain and difficulty walking.   He denies skin breakdown or open wounds. States he has vascular problems and it's time for him to return back to see vascular specialist.

## 2021-09-21 NOTE — Telephone Encounter (Signed)
Heart Failure Nurse Navigator Progress Note  Attempted to reach regarding HV TOC reminder. Unable to reach/no opportunity to leave message.   Ozella Rocks, MSN, RN Heart Failure Nurse Navigator [phone]

## 2021-09-21 NOTE — Progress Notes (Signed)
Virtual Visit via Telephone Note  I connected with [name], on 09/21/2021 at 10:59 AM by telephone due to the COVID-19 pandemic and verified that I am speaking with the correct person using two identifiers.   Consent: I discussed the limitations, risks, security and privacy concerns of performing an evaluation and management service by telephone and the availability of in person appointments. I also discussed with the patient that there may be a patient responsible charge related to this service. The patient expressed understanding and agreed to proceed.   Location of Patient: Home  Location of Provider: Clinic   Persons participating in Telemedicine visit: Nakia Koble [place]-CMA Dr. Margarita Rana     History of Present Illness: Robert Sanchez is a 65 y.o. year old male with Type 2 DM (A1c 13.3) , CAD s/p CABG x3, PVD status post thrombectomy of left common femoral to PT composite bypass, with retrograde iliac angioplasty and revision of distal bypass to below the knee popliteal artery/TP trunk with a bovine pericardial patch angioplasty, subsequent thrombosis and angioplasty of distal anastomosis, partial L big toe amputation in 07/2020.   Endorses presence of L foot pain for which he had an ED visit 1 week ago due to concern for intermittent discoloration in his left foot and L foot pain.  Per ED notes pulses were detectable via Doppler and he had good capillary refill with no concern for critical limb ischemia.  There was a concern for early infection and he was placed on doxycycline.  He remains chronically on Xarelto. He has an appointment with Vascular on 10/13/21  Sometimes his sugars are in the 120s in the evening and he has questions about if he needs to administer  his Humulin 70/30 as he is concerned he might bottom out.  The lowest evening sugars he has gotten is 96 and his morning sugars are higher in the 150-250 range.  He is working on cutting down  on his carbs and fried foods.  Past Medical History:  Diagnosis Date   Acute pulmonary edema ([place]) 06/05/2018   Arthritis    COPD (chronic obstructive pulmonary disease) ([place])    " beginning stages " per pt   Coronary artery disease    Diabetes mellitus without complication ([place])    type 2   Diabetic neuropathy (HCC)    Diabetic neuropathy ([place])    Emphysema/COPD ([place])    " beginning stages"   GERD (gastroesophageal reflux disease)    HLD (hyperlipidemia)    HOH (hard of hearing)    no hearing aids   Myocardial infarction [facility])    Peripheral vascular disease (HCC)    Allergies  Allergen Reactions   Lisinopril Other (See Comments)    Syncope   Codeine Rash    Current Outpatient Medications on File Prior to Visit  Medication Sig Dispense Refill   Accu-Chek Softclix Lancets lancets Use to test blood sugars up to 4 times daily as directed. 100 each 5   aspirin 81 MG EC tablet Take 1 tablet (81 mg total) by mouth daily. Swallow whole.     atorvastatin (LIPITOR) 80 MG tablet Take 1 tablet (80 mg total) by mouth at bedtime. 30 tablet 3   Blood Glucose Monitoring Suppl (BLOOD GLUCOSE MONITOR SYSTEM) w/Device KIT Use to test blood sugars up to 4 times daily as directed. 1 kit 0   carvedilol (COREG) 6.25 MG tablet Take 1 tablet (6.25 mg total) by mouth 2 (two) times daily with a  meal. 60 tablet 5   doxycycline (VIBRAMYCIN) 100 MG capsule Take 1 capsule (100 mg total) by mouth 2 (two) times daily for 7 days. 14 capsule 0   furosemide (LASIX) 40 MG tablet Take 1 tablet (40 mg total) by mouth daily. 30 tablet 5   glucose blood test strip Use to test blood sugars up to 4 times daily as directed. 100 each 5   insulin isophane & regular human KwikPen (HUMULIN 70/30 MIX) (70-30) 100 UNIT/ML KwikPen Inject 30 units with breakfast and dinner. 18 mL 5   Insulin Pen Needle 32G X 4 MM MISC Use as directed with insulin pen 200 each 0   losartan (COZAAR) 25 MG tablet Take 0.5 tablets (12.5 mg total)  by mouth at bedtime. 45 tablet 3   metFORMIN (GLUCOPHAGE) 500 MG tablet Take 1 tablet (500 mg total) by mouth 2 (two) times daily with a meal. 60 tablet 5   Multiple Vitamin (MULTIVITAMIN) tablet Take 1 tablet by mouth daily.     potassium chloride SA (KLOR-CON) 20 MEQ tablet Take 1 tablet (20 mEq total) by mouth daily. 30 tablet 5   rivaroxaban (XARELTO) 20 MG TABS tablet Take 1 tablet (20 mg total) by mouth daily with supper. 30 tablet 5   spironolactone (ALDACTONE) 25 MG tablet Take 0.5 tablets (12.5 mg total) by mouth daily. 45 tablet 3   No current facility-administered medications on file prior to visit.    ROS: See HPI  Observations/Objective: Awake, alert, oriented x3 Not in acute distress Normal mood   CMP Latest Ref Rng & Units 09/14/2021 09/08/2021 08/31/2021  Glucose 70 - 99 mg/dL 176(H) 97 123(H)  BUN 8 - 23 mg/dL 22 20 28(H)  Creatinine 0.61 - 1.24 mg/dL 1.33(H) 1.07 1.34(H)  Sodium 135 - 145 mmol/L 136 137 137  Potassium 3.5 - 5.1 mmol/L 4.2 4.4 3.9  Chloride 98 - 111 mmol/L 100 102 104  CO2 22 - 32 mmol/L '23 25 26  ' Calcium 8.9 - 10.3 mg/dL 9.2 9.4 9.0  Total Protein 6.5 - 8.1 g/dL - - -  Total Bilirubin 0.3 - 1.2 mg/dL - - -  Alkaline Phos 38 - 126 U/L - - -  AST 15 - 41 U/L - - -  ALT 0 - 44 U/L - - -    Lipid Panel     Component Value Date/Time   CHOL 122 08/05/2020 0524   CHOL 151 06/27/2020 1216   TRIG 104 08/05/2020 0524   HDL 22 (L) 08/05/2020 0524   HDL 27 (L) 06/27/2020 1216   CHOLHDL 5.5 08/05/2020 0524   VLDL 21 08/05/2020 0524   LDLCALC 79 08/05/2020 0524   LDLCALC 100 (H) 06/27/2020 1216   LABVLDL 24 06/27/2020 1216    Lab Results  Component Value Date   HGBA1C 13.3 (H) 08/28/2021    Assessment and Plan: 1. Uncontrolled type 2 diabetes mellitus with hyperglycemia (HCC) Uncontrolled with A1c of 13.3; goal is less than 7.0 Surprisingly there is a discrepancy between his reported blood sugars and his actual A1c He will need an office  visit so his blood sugar log can be reviewed and regimen changes made appropriately Advised to continue current dose of Humulin 70/30 and avoid skipping insulin Consider initiation of GLP-1 receptor agonist at next in person visit Counseled on Diabetic diet, my plate method, 606 minutes of moderate intensity exercise/week Blood sugar logs with fasting goals of 80-120 mg/dl, random of less than 180 and in the event of sugars  less than 60 mg/dl or greater than 400 mg/dl encouraged to notify the clinic. Advised on the need for annual eye exams, annual foot exams, Pneumonia vaccine.  2. PAD (peripheral artery disease) (HCC) High risk patient with previous history of PCI, amputation Continue Xarelto Currently on antibiotics Has upcoming appointment with vascular  Follow Up Instructions: Keep upcoming appointment with me in 1 month.   I discussed the assessment and treatment plan with the patient. The patient was provided an opportunity to ask questions and all were answered. The patient agreed with the plan and demonstrated an understanding of the instructions.   The patient was advised to call back or seek an in-person evaluation if the symptoms worsen or if the condition fails to improve as anticipated.     I provided 12 minutes total of non-face-to-face time during this encounter.   Charlott Rakes, MD, FAAFP. [facility] and [place], [place]   09/21/2021, 10:59 AM

## 2021-09-22 ENCOUNTER — Ambulatory Visit (HOSPITAL_COMMUNITY): Payer: Self-pay

## 2021-09-22 NOTE — Progress Notes (Incomplete)
No Show

## 2021-09-23 ENCOUNTER — Other Ambulatory Visit: Payer: Self-pay

## 2021-10-05 ENCOUNTER — Other Ambulatory Visit: Payer: Self-pay

## 2021-10-05 ENCOUNTER — Encounter: Payer: Self-pay | Admitting: Family Medicine

## 2021-10-05 ENCOUNTER — Ambulatory Visit: Payer: Self-pay | Attending: Family Medicine | Admitting: Family Medicine

## 2021-10-05 VITALS — BP 123/68 | HR 68 | Ht 68.0 in | Wt 218.0 lb

## 2021-10-05 DIAGNOSIS — Z23 Encounter for immunization: Secondary | ICD-10-CM

## 2021-10-05 DIAGNOSIS — I739 Peripheral vascular disease, unspecified: Secondary | ICD-10-CM

## 2021-10-05 DIAGNOSIS — E1165 Type 2 diabetes mellitus with hyperglycemia: Secondary | ICD-10-CM

## 2021-10-05 LAB — GLUCOSE, POCT (MANUAL RESULT ENTRY): POC Glucose: 176 mg/dl — AB (ref 70–99)

## 2021-10-05 MED ORDER — DOXYCYCLINE HYCLATE 100 MG PO TABS
100.0000 mg | ORAL_TABLET | Freq: Two times a day (BID) | ORAL | 0 refills | Status: DC
  Filled 2021-10-05: qty 14, 7d supply, fill #0

## 2021-10-05 MED ORDER — TRULICITY 0.75 MG/0.5ML ~~LOC~~ SOAJ
0.7500 mg | SUBCUTANEOUS | 3 refills | Status: DC
  Filled 2021-10-05: qty 2, 28d supply, fill #0
  Filled 2021-10-27: qty 2, 28d supply, fill #1
  Filled 2021-12-15: qty 2, 28d supply, fill #0
  Filled 2022-02-01: qty 2, 28d supply, fill #1

## 2021-10-05 NOTE — Progress Notes (Signed)
Bg 176

## 2021-10-05 NOTE — Progress Notes (Signed)
Subjective:  Patient ID: Robert Sanchez, male    DOB: [birth year]  Age: 65 y.o. MRN: [id number]  CC: Hospitalization Follow-up   HPI Robert Sanchez is a 65 y.o. year old male with a history of  Type 2 DM (A1c 13.3) , CAD s/p CABG x3, PVD status post thrombectomy of left common femoral to PT composite bypass, with retrograde iliac angioplasty and revision of distal bypass to below the knee popliteal artery/TP trunk with a bovine pericardial patch angioplasty, subsequent thrombosis and angioplasty of distal anastomosis, partial L big toe amputation in 07/2020.    Interval History: He had a visit with me via telehealth 2 weeks ago but it seems this visit was scheduled as a follow-up from his hospitalization in 08/2021 where he was managed for acute respiratory failure secondary to CHF exacerbation and was treated with IV diuretics with resulting improvement. He has since followed up with heart and vascular last month where he was started on losartan 12.5 mg and spironolactone 12.5 mg. He reports doing well from a respiratory and cardiac standpoint and has no dyspnea or pedal edema.  At his last visit with me he did have left foot pain and had been seen at the emergency room and placed on antibiotics.  Follow-up was scheduled with vascular surgery. He sees Dr Carlis Abbott on 10/13/21. His left foot is a lot better but he does have a slight purplish discoloration.  Currently on Xarelto.  Endorses compliance with his insulin with no hypoglycemic episodes.   Past Medical History:  Diagnosis Date   Acute pulmonary edema (Robert Sanchez) 06/05/2018   Arthritis    COPD (chronic obstructive pulmonary disease) (Robert Sanchez)    " beginning stages " per pt   Coronary artery disease    Diabetes mellitus without complication (Robert Sanchez)    type 2   Diabetic neuropathy (HCC)    Diabetic neuropathy (Robert Sanchez)    Emphysema/COPD (Robert Sanchez)    " beginning stages"   GERD (gastroesophageal reflux disease)    HLD (hyperlipidemia)    HOH  (hard of hearing)    no hearing aids   Myocardial infarction [facility])    Peripheral vascular disease ([place])     Past Surgical History:  Procedure Laterality Date   ABDOMINAL AORTOGRAM W/LOWER EXTREMITY N/A 06/21/2018   Procedure: ABDOMINAL AORTOGRAM W/LOWER EXTREMITY;  Surgeon: Robert Heck, MD;  Location: [place] [facility];  Service: Cardiovascular;  Laterality: N/A;   ABDOMINAL AORTOGRAM W/LOWER EXTREMITY Left 01/16/2020   Procedure: ABDOMINAL AORTOGRAM W/LOWER EXTREMITY;  Surgeon: Robert Heck, MD;  Location: [place] [facility];  Service: Cardiovascular;  Laterality: Left;   ABDOMINAL AORTOGRAM W/LOWER EXTREMITY N/A 07/09/2020   Procedure: ABDOMINAL AORTOGRAM W/LOWER EXTREMITY;  Surgeon: Robert Heck, MD;  Location: [place] [facility];  Service: Cardiovascular;  Laterality: N/A;  TPA infusion   AMPUTATION Left 08/04/2020   Procedure: LEFT PARTIAL GREAT TOE AMPUTATION;  Surgeon: Robert Heck, MD;  Location: [place];  Service: Vascular;  Laterality: Left;   APPLICATION OF WOUND VAC Left 08/04/2020   Procedure: LEFT GROIN DEBRIDEMENT WITH APPLICATION OF WOUND [place];  Surgeon: Robert Heck, MD;  Location: [place];  Service: Vascular;  Laterality: Left;   CORONARY ARTERY BYPASS GRAFT N/A 06/12/2018   Procedure: CORONARY ARTERY BYPASS GRAFTING (CABG) x 3 WITH ENDOSCOPIC HARVESTING OF RIGHT GREATER SAPHENOUS VEIN. LIMA TO LAD. SVG TO PD. SVG TO DIAGONAL.;  Surgeon: Robert Poot, MD;  Location: [place];  Service: Open Heart Surgery;  Laterality: N/A;   FEMORAL-POPLITEAL BYPASS GRAFT Left 07/10/2020   Procedure: REDO EXPOSURE OF LEFT COMMON FEMORAL ARTERY LEFT COMMON FEMORAL TO POSTERIOR TIBIAL COMPOSITE BYPASS GRAFT;  Surgeon: Robert Heck, MD;  Location: [place];  Service: Vascular;  Laterality: Left;   FEMORAL-TIBIAL BYPASS GRAFT Left 08/21/2018   Procedure: BYPASS GRAFT LEFT FEMORAL TO POSTERIOR TIBIAL ARTERY USING LEFT REVERSED GREAT SAPHENOUS VEIN;  Surgeon:  Robert Heck, MD;  Location: [place];  Service: Vascular;  Laterality: Left;   FEMORAL-TIBIAL BYPASS GRAFT Left 08/04/2020   Procedure: REVISION FEMORAL-DISTAL BYPASS WITH BIOLOGICAL BOVINE PATCH;  Surgeon: Robert Heck, MD;  Location: [place];  Service: Vascular;  Laterality: Left;   KNEE ARTHROSCOPY     LOWER EXTREMITY ANGIOGRAM Left 08/04/2020   Procedure: LEFT ILIAC ANGIOGRAM, ANGIOPLASTY OF LEFT ILIAC STENT WITH DRUG COATED BALLOON;  Surgeon: Robert Heck, MD;  Location: [place];  Service: Vascular;  Laterality: Left;   LOWER EXTREMITY ANGIOGRAPHY Left 01/17/2020   Procedure: Lower Extremity Angiography;  Surgeon: Robert Heck, MD;  Location: [place] [facility];  Service: Cardiovascular;  Laterality: Left;   LOWER EXTREMITY ANGIOGRAPHY N/A 01/07/2021   Procedure: LOWER EXTREMITY ANGIOGRAPHY;  Surgeon: Robert Robins, MD;  Location: [place] [facility];  Service: Cardiovascular;  Laterality: N/A;   PERIPHERAL VASCULAR BALLOON ANGIOPLASTY Left 01/17/2020   Procedure: PERIPHERAL VASCULAR BALLOON ANGIOPLASTY;  Surgeon: Robert Heck, MD;  Location: [place] [facility];  Service: Cardiovascular;  Laterality: Left;  pt   PERIPHERAL VASCULAR INTERVENTION Left 06/21/2018   Procedure: PERIPHERAL VASCULAR INTERVENTION;  Surgeon: Robert Heck, MD;  Location: [place] [facility];  Service: Cardiovascular;  Laterality: Left;   PERIPHERAL VASCULAR INTERVENTION Left 01/08/2021   Procedure: PERIPHERAL VASCULAR INTERVENTION;  Surgeon: Robert Heck, MD;  Location: [place] [facility];  Service: Cardiovascular;  Laterality: Left;   PERIPHERAL VASCULAR THROMBECTOMY Left 01/16/2020   Procedure: PERIPHERAL VASCULAR THROMBECTOMY;  Surgeon: Robert Heck, MD;  Location: [place] [facility];  Service: Cardiovascular;  Laterality: Left;  Lytic Catheter Placement left fem-pop bypass   PERIPHERAL VASCULAR THROMBECTOMY Left 01/17/2020   Procedure: PERIPHERAL VASCULAR  THROMBECTOMY;  Surgeon: Robert Heck, MD;  Location: [place] [facility];  Service: Cardiovascular;  Laterality: Left;  fem-pt bypass   PERIPHERAL VASCULAR THROMBECTOMY Left 07/10/2020   Procedure: lysis recheck;  Surgeon: Robert Heck, MD;  Location: [place] [facility];  Service: Cardiovascular;  Laterality: Left;   PULMONARY THROMBECTOMY N/A 01/08/2021   Procedure: LYSIS RECHECK;  Surgeon: Robert Heck, MD;  Location: [place] [facility];  Service: Cardiovascular;  Laterality: N/A;   RIGHT/LEFT HEART CATH AND CORONARY ANGIOGRAPHY N/A 06/05/2018   Procedure: RIGHT/LEFT HEART CATH AND CORONARY ANGIOGRAPHY;  Surgeon: Dixie Dials, MD;  Location: [place] [facility];  Service: Cardiovascular;  Laterality: N/A;   SPINE SURGERY     TEE WITHOUT CARDIOVERSION N/A 06/12/2018   Procedure: TRANSESOPHAGEAL ECHOCARDIOGRAM (TEE);  Surgeon: Prescott Gum, Collier Salina, MD;  Location: [place];  Service: Open Heart Surgery;  Laterality: N/A;   teeth extractions     THROMBECTOMY FEMORAL ARTERY Left 08/04/2020   Procedure: THROMBECTOMY OF LEFT FEMORAL TP COMPOSTITE BYPASS;  Surgeon: Robert Heck, MD;  Location: [place];  Service: Vascular;  Laterality: Left;   THROMBECTOMY ILIAC ARTERY Left 07/10/2020   Procedure: THROMBECTOMY OF LEFT EXTERNAL ILIAC AND LEFT COMMON FEMORAL AND LEFT POSTERIOR TIBIAL ARTERIES;  Surgeon: Robert Heck, MD;  Location: [place];  Service: Vascular;  Laterality: Left;   VEIN HARVEST Left 08/21/2018   Procedure: VEIN HARVEST LEFT GREAT SAPHENOUS VEIN;  Surgeon: Robert Heck, MD;  [facility];  Service: Vascular;  Laterality: Left;    Family History  Problem Relation Age of Onset   Diabetes Mother    Lung disease Mother    Cancer Father    Heart disease Father     Allergies  Allergen Reactions   Lisinopril Other (See Comments)    Syncope   Codeine Rash    Outpatient Medications Prior to Visit  Medication Sig Dispense Refill   Accu-Chek Softclix  Lancets lancets Use to test blood sugars up to 4 times daily as directed. 100 each 5   aspirin 81 MG EC tablet Take 1 tablet (81 mg total) by mouth daily. Swallow whole.     atorvastatin (LIPITOR) 80 MG tablet Take 1 tablet (80 mg total) by mouth at bedtime. 30 tablet 3   Blood Glucose Monitoring Suppl (BLOOD GLUCOSE MONITOR SYSTEM) w/Device KIT Use to test blood sugars up to 4 times daily as directed. 1 kit 0   carvedilol (COREG) 6.25 MG tablet Take 1 tablet (6.25 mg total) by mouth 2 (two) times daily with a meal. 60 tablet 5   furosemide (LASIX) 40 MG tablet Take 1 tablet (40 mg total) by mouth daily. 30 tablet 5   glucose blood test strip Use to test blood sugars up to 4 times daily as directed. 100 each 5   insulin isophane & regular human KwikPen (HUMULIN 70/30 MIX) (70-30) 100 UNIT/ML KwikPen Inject 30 units with breakfast and dinner. 18 mL 5   Insulin Pen Needle 32G X 4 MM MISC Use as directed with insulin pen 200 each 0   losartan (COZAAR) 25 MG tablet Take 0.5 tablets (12.5 mg total) by mouth at bedtime. 45 tablet 3   metFORMIN (GLUCOPHAGE) 500 MG tablet Take 1 tablet (500 mg total) by mouth 2 (two) times daily with a meal. 60 tablet 5   Multiple Vitamin (MULTIVITAMIN) tablet Take 1 tablet by mouth daily.     potassium chloride SA (KLOR-CON) 20 MEQ tablet Take 1 tablet (20 mEq total) by mouth daily. 30 tablet 5   rivaroxaban (XARELTO) 20 MG TABS tablet Take 1 tablet (20 mg total) by mouth daily with supper. 30 tablet 5   spironolactone (ALDACTONE) 25 MG tablet Take 0.5 tablets (12.5 mg total) by mouth daily. 45 tablet 3   No facility-administered medications prior to visit.     ROS Review of Systems  Constitutional:  Negative for activity change and appetite change.  HENT:  Negative for sinus pressure and sore throat.   Eyes:  Negative for visual disturbance.  Respiratory:  Negative for cough, chest tightness and shortness of breath.   Cardiovascular:  Negative for chest pain and  leg swelling.  Gastrointestinal:  Negative for abdominal distention, abdominal pain, constipation and diarrhea.  Endocrine: Negative.   Genitourinary:  Negative for dysuria.  Musculoskeletal:  Negative for joint swelling and myalgias.  Skin:  Positive for color change. Negative for rash.  Allergic/Immunologic: Negative.   Neurological:  Negative for weakness, light-headedness and numbness.  Psychiatric/Behavioral:  Negative for dysphoric mood and suicidal ideas.    Objective:  BP 123/68   Pulse 68   Ht '5\' 8"'  (1.727 m)   Wt 218 lb (98.9 kg)   SpO2 97%   BMI 33.15 kg/m   BP/Weight 10/05/2021 09/14/2021 40/07/8118  Systolic BP 147 829 562  Diastolic BP 68 89 68  Wt. (Lbs) 218 - 216.6  BMI 33.15 - 32.93      Physical Exam Constitutional:      Appearance: He is well-developed.  Cardiovascular:     Rate and Rhythm: Normal rate.     Heart sounds: Normal heart sounds. No murmur heard. Pulmonary:     Effort: Pulmonary effort is normal.     Breath sounds: Normal breath sounds. No wheezing or rales.  Chest:     Chest wall: No tenderness.  Abdominal:     General: Bowel sounds are normal. There is no distension.     Palpations: Abdomen is soft. There is no mass.     Tenderness: There is no abdominal tenderness.  Musculoskeletal:        General: Normal range of motion.     Right lower leg: No edema.     Left lower leg: No edema.  Neurological:     Mental Status: He is alert and oriented to person, place, and time.  Psychiatric:        Mood and Affect: Mood normal.   Diabetic Foot Exam - Simple   Simple Foot Form Diabetic Foot exam was performed with the following findings: Yes 10/05/2021 10:33 AM  Visual Inspection See comments: Yes Sensation Testing See comments: Yes Pulse Check See comments: Yes Comments Left great toe amputation with purplish discoloration on medial aspect of left foot.  No ulcerations or skin breakdown bilaterally. Unable to palpate dorsalis pedis  and posterior tibialis bilaterally. Abnormal monofilament test in left foot, right foot is normal     CMP Latest Ref Rng & Units 09/14/2021 09/08/2021 08/31/2021  Glucose 70 - 99 mg/dL 176(H) 97 123(H)  BUN 8 - 23 mg/dL 22 20 28(H)  Creatinine 0.61 - 1.24 mg/dL 1.33(H) 1.07 1.34(H)  Sodium 135 - 145 mmol/L 136 137 137  Potassium 3.5 - 5.1 mmol/L 4.2 4.4 3.9  Chloride 98 - 111 mmol/L 100 102 104  CO2 22 - 32 mmol/L '23 25 26  ' Calcium 8.9 - 10.3 mg/dL 9.2 9.4 9.0  Total Protein 6.5 - 8.1 g/dL - - -  Total Bilirubin 0.3 - 1.2 mg/dL - - -  Alkaline Phos 38 - 126 U/L - - -  AST 15 - 41 U/L - - -  ALT 0 - 44 U/L - - -    Lipid Panel     Component Value Date/Time   CHOL 122 08/05/2020 0524   CHOL 151 06/27/2020 1216   TRIG 104 08/05/2020 0524   HDL 22 (L) 08/05/2020 0524   HDL 27 (L) 06/27/2020 1216   CHOLHDL 5.5 08/05/2020 0524   VLDL 21 08/05/2020 0524   LDLCALC 79 08/05/2020 0524   LDLCALC 100 (H) 06/27/2020 1216    CBC    Component Value Date/Time   WBC 11.2 (H) 09/14/2021 1141   RBC 4.24 09/14/2021 1141   HGB 12.4 (L) 09/14/2021 1141   HCT 39.1 09/14/2021 1141   PLT 401 (H) 09/14/2021 1141   MCV 92.2 09/14/2021 1141   MCH 29.2 09/14/2021 1141   MCHC 31.7 09/14/2021 1141   RDW 13.6 09/14/2021 1141   LYMPHSABS 2.4 08/27/2021 2316   MONOABS 1.1 (H) 08/27/2021 2316   EOSABS 0.2 08/27/2021 2316   BASOSABS 0.1 08/27/2021 2316    Lab Results  Component Value Date   HGBA1C 13.3 (H) 08/28/2021    Assessment & Plan:  1. Uncontrolled type 2 diabetes mellitus with hyperglycemia (HCC) Uncontrolled with A1c  of 13.3 His blood sugars are starting to show some improvement Will add on GLP-1 due to cardiovascular benefits Clinical pharmacist called in to provide education on administration of GLP-1 agonist - Dulaglutide (TRULICITY) 7.59 FM/3.8GY SOPN; Inject 0.75 mg into the skin once a week.  Dispense: 2 mL; Refill: 3 - POCT glucose (manual entry)  2. PAD (peripheral  artery disease) ([place]) He does have skin changes of PAD Currently on Xarelto Status post multiple interventions in the past Has upcoming appointment next week with vascular. Risk factor modification   Meds ordered this encounter  Medications   Dulaglutide (TRULICITY) 6.59 DJ/5.7SV SOPN    Sig: Inject 0.75 mg into the skin once a week.    Dispense:  2 mL    Refill:  3    Follow-up: Return in about 3 months (around 01/03/2022) for Chronic medical conditions.       Charlott Rakes, MD, FAAFP. [facility] and [place], [place]   10/05/2021, 10:54 AM

## 2021-10-13 ENCOUNTER — Other Ambulatory Visit: Payer: Self-pay

## 2021-10-13 ENCOUNTER — Ambulatory Visit (INDEPENDENT_AMBULATORY_CARE_PROVIDER_SITE_OTHER)
Admission: RE | Admit: 2021-10-13 | Discharge: 2021-10-13 | Disposition: A | Discharge status: Home / Self Care | Payer: Self-pay | Source: Ambulatory Visit | Attending: Physician Assistant | Admitting: Physician Assistant

## 2021-10-13 ENCOUNTER — Ambulatory Visit (HOSPITAL_COMMUNITY)
Admission: RE | Admit: 2021-10-13 | Discharge: 2021-10-13 | Disposition: A | Discharge status: Home / Self Care | Payer: Self-pay | Source: Ambulatory Visit | Attending: Physician Assistant | Admitting: Physician Assistant

## 2021-10-13 ENCOUNTER — Ambulatory Visit (INDEPENDENT_AMBULATORY_CARE_PROVIDER_SITE_OTHER): Payer: Self-pay | Admitting: Physician Assistant

## 2021-10-13 VITALS — BP 133/76 | HR 75 | Temp 98.6°F | Resp 20 | Ht 68.0 in | Wt 210.4 lb

## 2021-10-13 DIAGNOSIS — I739 Peripheral vascular disease, unspecified: Secondary | ICD-10-CM | POA: Insufficient documentation

## 2021-10-13 NOTE — Progress Notes (Signed)
HISTORY AND PHYSICAL     CC:  follow up. Requesting Provider:  Charlott Rakes, MD  HPI: This is a 65 y.o. male who is here today for follow up for PAD.    He initially underwent left common femoral to PT bypass with reversed saphenous vein in 2019 for CLI with rest pain.  Ultimately he presented to the hospital in March 2021 with an occluded bypass and underwent thrombolysis and subsequent percutaneous mechanical thrombectomy with angioplasty of the bypass and PT on 01/16/20- 01/17/2020.  Ultimately his bypass was salvaged and he was discharged on Eliquis.  During this event he did develop necrotic left great toe that we have been watching.  He then presented again and his bypass had occluded a second time in September 2021 and underwent second attempt thrombolysis that was unsuccessful.  Ultimately during an attempted thrombolysis we took him back he developed thrombus in the common femoral and iliac and required thrombectomy and ultimately we performed a left common femoral to PT composite bypass using the distal vein on the PT since this appeared to be healthy.  This then thrombosed immediately after hospital discharge.  Then on 08/04/2020 he underwent thrombectomy of the left common femoral to PT composite bypass.  We performed a left retrograde left external iliac angioplasty of an external iliac artery stent.  We then put a bovine patch on the below-knee popliteal artery TP trunk and revised the bypass to the below-knee popliteal artery TP trunk with PTFE.Marland Kitchen  He also got a partial left toe amp at this time.   He then presented with an occluded bypass and underwent thrombolysis on 01/07/2021 with angioplasty of the distal anastomosis onto the BK pop TP trunk on 01/08/2021.    Pt was last seen in April 2022 by Dr. Carlis Abbott and at that time, his bypass was patent.    The pt returns today for follow up.  He states that about a month ago, he started having pain in the left foot that was more than normal and  it was more red with small bumps on the side.  He states that he was told to call our office to be seen and he is here today for that visit.    He states that this time is different than when the bypass occluded before.  He states the pain comes and goes.  It is mostly in his left heel.  He states the redness comes and goes.  He states that 2 weeks ago, he had to walk with a walker because the pain was so bad.  He states the pain comes and goes.  He is able to walk now without difficulty.  He does not have any non healing wounds.   He feels the left leg is weak.  He states that he has had to sleep in the recliner and hang his feet down to feel better.  He states it hurts the worst when he puts pressure on his heel.  He states that his leg swells occasionally.  He has not been able to work for about a month.  He states he has not really been smoking all that much. He has cut back significantly.  He states he has thought about going ahead and having his leg cut off than to keep going like this.    The pt is on a statin for cholesterol management.    The pt is on an aspirin.    Other AC:  Xarelto The pt is on  ARB, diuretic for hypertension.  The pt does have diabetes. Tobacco hx:  current-see above  Pt does not have family hx of AAA.  Past Medical History:  Diagnosis Date   Acute pulmonary edema ([place]) 06/05/2018   Arthritis    COPD (chronic obstructive pulmonary disease) ([place])    " beginning stages " per pt   Coronary artery disease    Diabetes mellitus without complication ([place])    type 2   Diabetic neuropathy ([facility])    Diabetic neuropathy ([place])    Emphysema/COPD ([place])    " beginning stages"   GERD (gastroesophageal reflux disease)    HLD (hyperlipidemia)    HOH (hard of hearing)    no hearing aids   Myocardial infarction [facility])    Peripheral vascular disease ([place])     Past Surgical History:  Procedure Laterality Date   ABDOMINAL AORTOGRAM W/LOWER EXTREMITY N/A 06/21/2018   Procedure:  ABDOMINAL AORTOGRAM W/LOWER EXTREMITY;  Surgeon: [name], MD;  Location: [place] [facility];  Service: Cardiovascular;  Laterality: N/A;   ABDOMINAL AORTOGRAM W/LOWER EXTREMITY Left 01/16/2020   Procedure: ABDOMINAL AORTOGRAM W/LOWER EXTREMITY;  Surgeon: [name], MD;  Location: [place] [facility];  Service: Cardiovascular;  Laterality: Left;   ABDOMINAL AORTOGRAM W/LOWER EXTREMITY N/A 07/09/2020   Procedure: ABDOMINAL AORTOGRAM W/LOWER EXTREMITY;  Surgeon: [name], MD;  Location: [place] [facility];  Service: Cardiovascular;  Laterality: N/A;  TPA infusion   AMPUTATION Left 08/04/2020   Procedure: LEFT PARTIAL GREAT TOE AMPUTATION;  Surgeon: [name], MD;  Location: [place];  Service: Vascular;  Laterality: Left;   APPLICATION OF WOUND VAC Left 08/04/2020   Procedure: LEFT GROIN DEBRIDEMENT WITH APPLICATION OF WOUND [place];  Surgeon: [name], MD;  Location: [place];  Service: Vascular;  Laterality: Left;   CORONARY ARTERY BYPASS GRAFT N/A 06/12/2018   Procedure: CORONARY ARTERY BYPASS GRAFTING (CABG) x 3 WITH ENDOSCOPIC HARVESTING OF RIGHT GREATER SAPHENOUS VEIN. LIMA TO LAD. SVG TO PD. SVG TO DIAGONAL.;  Surgeon: Ivin Poot, MD;  Location: [place];  Service: Open Heart Surgery;  Laterality: N/A;   FEMORAL-POPLITEAL BYPASS GRAFT Left 07/10/2020   Procedure: REDO EXPOSURE OF LEFT COMMON FEMORAL ARTERY LEFT COMMON FEMORAL TO POSTERIOR TIBIAL COMPOSITE BYPASS GRAFT;  Surgeon: [name], MD;  Location: [place];  Service: Vascular;  Laterality: Left;   FEMORAL-TIBIAL BYPASS GRAFT Left 08/21/2018   Procedure: BYPASS GRAFT LEFT FEMORAL TO POSTERIOR TIBIAL ARTERY USING LEFT REVERSED GREAT SAPHENOUS VEIN;  Surgeon: [name], MD;  Location: [place];  Service: Vascular;  Laterality: Left;   FEMORAL-TIBIAL BYPASS GRAFT Left 08/04/2020   Procedure: REVISION FEMORAL-DISTAL BYPASS WITH BIOLOGICAL BOVINE PATCH;  Surgeon: [name], MD;   Location: [place];  Service: Vascular;  Laterality: Left;   KNEE ARTHROSCOPY     LOWER EXTREMITY ANGIOGRAM Left 08/04/2020   Procedure: LEFT ILIAC ANGIOGRAM, ANGIOPLASTY OF LEFT ILIAC STENT WITH DRUG COATED BALLOON;  Surgeon: [name], MD;  Location: [place];  Service: Vascular;  Laterality: Left;   LOWER EXTREMITY ANGIOGRAPHY Left 01/17/2020   Procedure: Lower Extremity Angiography;  Surgeon: [name], MD;  Location: [place] [facility];  Service: Cardiovascular;  Laterality: Left;   LOWER EXTREMITY ANGIOGRAPHY N/A 01/07/2021   Procedure: LOWER EXTREMITY ANGIOGRAPHY;  Surgeon: Cherre Robins, MD;  Location: [place] [facility];  Service: Cardiovascular;  Laterality: N/A;   PERIPHERAL VASCULAR BALLOON ANGIOPLASTY Left 01/17/2020   Procedure: PERIPHERAL  VASCULAR BALLOON ANGIOPLASTY;  Surgeon: [name], MD;  Location: [place] [facility];  Service: Cardiovascular;  Laterality: Left;  pt   PERIPHERAL VASCULAR INTERVENTION Left 06/21/2018   Procedure: PERIPHERAL VASCULAR INTERVENTION;  Surgeon: [name], MD;  Location: [place] [facility];  Service: Cardiovascular;  Laterality: Left;   PERIPHERAL VASCULAR INTERVENTION Left 01/08/2021   Procedure: PERIPHERAL VASCULAR INTERVENTION;  Surgeon: [name], MD;  Location: [place] [facility];  Service: Cardiovascular;  Laterality: Left;   PERIPHERAL VASCULAR THROMBECTOMY Left 01/16/2020   Procedure: PERIPHERAL VASCULAR THROMBECTOMY;  Surgeon: [name], MD;  Location: [place] [facility];  Service: Cardiovascular;  Laterality: Left;  Lytic Catheter Placement left fem-pop bypass   PERIPHERAL VASCULAR THROMBECTOMY Left 01/17/2020   Procedure: PERIPHERAL VASCULAR THROMBECTOMY;  Surgeon: [name], MD;  Location: [place] [facility];  Service: Cardiovascular;  Laterality: Left;  fem-pt bypass   PERIPHERAL VASCULAR THROMBECTOMY Left 07/10/2020   Procedure: lysis recheck;  Surgeon: [name],  MD;  Location: [place] [facility];  Service: Cardiovascular;  Laterality: Left;   PULMONARY THROMBECTOMY N/A 01/08/2021   Procedure: LYSIS RECHECK;  Surgeon: [name], MD;  Location: [place] [facility];  Service: Cardiovascular;  Laterality: N/A;   RIGHT/LEFT HEART CATH AND CORONARY ANGIOGRAPHY N/A 06/05/2018   Procedure: RIGHT/LEFT HEART CATH AND CORONARY ANGIOGRAPHY;  Surgeon: [place]ie Dials, MD;  Location: [place] [facility];  Service: Cardiovascular;  Laterality: N/A;   SPINE SURGERY     TEE WITHOUT CARDIOVERSION N/A 06/12/2018   Procedure: TRANSESOPHAGEAL ECHOCARDIOGRAM (TEE);  Surgeon: Prescott Gum, Collier [place]ina, MD;  Location: [place];  Service: Open Heart Surgery;  Laterality: N/A;   teeth extractions     THROMBECTOMY FEMORAL ARTERY Left 08/04/2020   Procedure: THROMBECTOMY OF LEFT FEMORAL TP COMPOSTITE BYPASS;  Surgeon: [name], MD;  Location: [place];  Service: Vascular;  Laterality: Left;   THROMBECTOMY ILIAC ARTERY Left 07/10/2020   Procedure: THROMBECTOMY OF LEFT EXTERNAL ILIAC AND LEFT COMMON FEMORAL AND LEFT POSTERIOR TIBIAL ARTERIES;  Surgeon: [name], MD;  Location: [place];  Service: Vascular;  Laterality: Left;   VEIN HARVEST Left 08/21/2018   Procedure: VEIN HARVEST LEFT GREAT SAPHENOUS VEIN;  Surgeon: [name], MD;  Location: [place];  Service: Vascular;  Laterality: Left;    Allergies  Allergen Reactions   Lisinopril Other (See Comments)    Syncope   Codeine Rash    Current Outpatient Medications  Medication Sig Dispense Refill   Accu-Chek Softclix Lancets lancets Use to test blood sugars up to 4 times daily as directed. 100 each 5   aspirin 81 MG EC tablet Take 1 tablet (81 mg total) by mouth daily. Swallow whole.     atorvastatin (LIPITOR) 80 MG tablet Take 1 tablet (80 mg total) by mouth at bedtime. 30 tablet 3   Blood Glucose Monitoring Suppl (BLOOD GLUCOSE MONITOR SYSTEM) w/Device KIT Use to test blood sugars up to 4 times daily as  directed. 1 kit 0   carvedilol (COREG) 6.25 MG tablet Take 1 tablet (6.25 mg total) by mouth 2 (two) times daily with a meal. 60 tablet 5   doxycycline (VIBRA-TABS) 100 MG tablet Take 1 tablet (100 mg total) by mouth 2 (two) times daily. 14 tablet 0   Dulaglutide (TRULICITY) 5.42 HC/6.2BJ SOPN Inject 0.75 mg into the skin once a week. 2 mL 3   furosemide (LASIX) 40 MG tablet Take 1 tablet (40 mg total)  by mouth daily. 30 tablet 5   glucose blood test strip Use to test blood sugars up to 4 times daily as directed. 100 each 5   insulin isophane & regular human KwikPen (HUMULIN 70/30 MIX) (70-30) 100 UNIT/ML KwikPen Inject 30 units with breakfast and dinner. 18 mL 5   Insulin Pen Needle 32G X 4 MM MISC Use as directed with insulin pen 200 each 0   losartan (COZAAR) 25 MG tablet Take 0.5 tablets (12.5 mg total) by mouth at bedtime. 45 tablet 3   metFORMIN (GLUCOPHAGE) 500 MG tablet Take 1 tablet (500 mg total) by mouth 2 (two) times daily with a meal. 60 tablet 5   Multiple Vitamin (MULTIVITAMIN) tablet Take 1 tablet by mouth daily.     potassium chloride SA (KLOR-CON) 20 MEQ tablet Take 1 tablet (20 mEq total) by mouth daily. 30 tablet 5   rivaroxaban (XARELTO) 20 MG TABS tablet Take 1 tablet (20 mg total) by mouth daily with supper. 30 tablet 5   spironolactone (ALDACTONE) 25 MG tablet Take 0.5 tablets (12.5 mg total) by mouth daily. 45 tablet 3   No current facility-administered medications for this visit.    Family History  Problem Relation Age of Onset   Diabetes Mother    Lung disease Mother    Cancer Father    Heart disease Father     Social History   Socioeconomic History   Marital status: Single    Spouse name: Not on file   Number of children: 1   Years of education: Not on file   Highest education level: 10th grade  Occupational History   Not on file  Tobacco Use   Smoking status: Some Days    Packs/day: 1.00    Years: 50.00    Pack years: 50.00    Types: Cigarettes     Last attempt to quit: 06/02/2018    Years since quitting: 3.3   Smokeless tobacco: Never   Tobacco comments:    1PPD x 50 years. 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   Vaping Use   Vaping Use: Never used  Substance and Sexual Activity   Alcohol use: Yes    Comment: occasional   Drug use: Not Currently    Frequency: 1.0 times per week    Types: Marijuana   Sexual activity: Not on file  Other Topics Concern   Not on file  Social History Narrative   Not on file   Social Determinants of Health   Financial Resource Strain: Low Risk    Difficulty of Paying Living Expenses: Not very hard  Food Insecurity: No Food Insecurity   Worried About Running Out of Food in the Last Year: Never true   Ran Out of Food in the Last Year: Never true  Transportation Needs: No Transportation Needs   Lack of Transportation (Medical): No   Lack of Transportation (Non-Medical): No  Physical Activity: Not on file  Stress: Not on file  Social Connections: Not on file  Intimate Partner Violence: Not on file     REVIEW OF SYSTEMS:   '[X]'  denotes positive finding, '[ ]'  denotes negative finding Cardiac  Comments:  Chest pain or chest pressure:    Shortness of breath upon exertion:    Short of breath when lying flat:    Irregular heart rhythm:        Vascular    Pain in calf, thigh, or hip brought on by ambulation: x   Pain in feet at  night that wakes you up from your sleep:  x   Blood clot in your veins:    Leg swelling:  x       Pulmonary    Oxygen at home:    Productive cough:     Wheezing:         Neurologic    Sudden weakness in arms or legs:  x   Sudden numbness in arms or legs:  x   Sudden onset of difficulty speaking or slurred speech:    Temporary loss of vision in one eye:     Problems with dizziness:         Gastrointestinal    Blood in stool:     Vomited blood:         Genitourinary    Burning when urinating:     Blood in urine:        Psychiatric    Major  depression:         Hematologic    Bleeding problems:    Problems with blood clotting too easily:        Skin    Rashes or ulcers:        Constitutional    Fever or chills:      PHYSICAL EXAMINATION:  Today's Vitals   10/13/21 0850  BP: 133/76  Pulse: 75  Resp: 20  Temp: 98.6 F (37 C)  TempSrc: Temporal  SpO2: 97%  Weight: 210 lb 6.4 oz (95.4 kg)  Height: '5\' 8"'  (1.727 m)  PainSc: 6    Body mass index is 31.99 kg/m.   General:  WDWN in NAD; vital signs documented above Gait: Not observed HENT: WNL, normocephalic Pulmonary: normal non-labored breathing , without wheezing Cardiac: regular HR, without carotid bruits Abdomen: soft, NT, no masses; aortic pulse is not palpable Skin: without rashes Vascular Exam/Pulses:  Right Left  Radial 2+ (normal) 2+ (normal)  Femoral 2+ (normal) 1+ (weak)  Popliteal Unable to palpate Unable to palpate  DP Not palpable Faint monophasic doppler  PT Not palpable Monophasic doppler  Peroneal N/a Monophasic doppler   Extremities: left foot does not have any non healing wounds.  It is ruborous this morning.  Warm to touch Musculoskeletal: no muscle wasting or atrophy  Neurologic: A&O X 3 Psychiatric:  The pt has Normal affect.   Non-Invasive Vascular Imaging:   ABI's/TBI's on 10/13/2021: Right:  0.60/0.36 - Great toe pressure: 48 Left:  0.45/n/a - Great toe pressure: toe amp  LLE Arterial duplex on 10/13/2021: Occluded bypass   ASSESSMENT/PLAN:: 65 y.o. male here for follow up for PAD with complex vascular hx of the LLE now with occluded LLE bypass  -pt does have monophasic doppler signals in the left foot.  Currently, this is tolerable for the pt.  Given his symptoms started a month ago, he is not a candidate for thrombolysis.  Dr. Carlis Abbott saw pt and discussed with him that he would see him back in a month and if his symptoms are not tolerable, we can proceed with angiogram to determine if he has any targets for 3rd time redo  bypass.  Pt expresses that he may want to go ahead and proceed with amputation.  We discussed that he will see how he does over the past month and see Dr. Carlis Abbott at that time and if he still wants to proceed with amputation, he can have that discussion further with Dr. Carlis Abbott.   -he knows to call us sooner if he develops any  non healing wounds or worsening rest pain in the left foot.   -continue xarelto/statin/asa -pt has cut back on his tobacco use - I discussed with him that it would benefit him greatly to cut this out all together.     Leontine Locket, [facility] [phone]  Clinic MD:   pt seen with Dr. Carlis Abbott

## 2021-10-27 ENCOUNTER — Other Ambulatory Visit: Payer: Self-pay

## 2021-10-30 ENCOUNTER — Inpatient Hospital Stay: Payer: Self-pay | Admitting: Family Medicine

## 2021-11-05 DIAGNOSIS — Z0279 Encounter for issue of other medical certificate: Secondary | ICD-10-CM

## 2021-11-06 ENCOUNTER — Telehealth: Payer: Self-pay

## 2021-11-06 NOTE — Telephone Encounter (Signed)
Patient dropped off STD forms to be completed to our office yesterday. After researching patient's chart, I needed additional info as to why patient was requesting STD when our providers did not indicate he needs to be out of work. Patient stated he has been out of work since the beginning of November. I advised that based off of the OV note from 02/10/21 Dr.Clark did not indicate that he should've been out of work and the OV note from 10/13/21 Samantha Rhyne,PA-C or Dr. Chestine Spore did not indicate he needed to be out of work until they re-evaluate him on 11/17/2021.I advised patient that I will follow up with Dr.Clark about this matter when he is back in the office. Patient voiced his understanding.

## 2021-11-17 ENCOUNTER — Other Ambulatory Visit: Payer: Self-pay

## 2021-11-17 ENCOUNTER — Ambulatory Visit (INDEPENDENT_AMBULATORY_CARE_PROVIDER_SITE_OTHER): Payer: Self-pay | Admitting: Vascular Surgery

## 2021-11-17 ENCOUNTER — Encounter: Payer: Self-pay | Admitting: Vascular Surgery

## 2021-11-17 VITALS — BP 108/64 | HR 90 | Temp 97.7°F | Resp 18 | Ht 69.0 in | Wt 212.0 lb

## 2021-11-17 DIAGNOSIS — I739 Peripheral vascular disease, unspecified: Secondary | ICD-10-CM

## 2021-11-17 DIAGNOSIS — I70222 Atherosclerosis of native arteries of extremities with rest pain, left leg: Secondary | ICD-10-CM

## 2021-11-17 NOTE — Progress Notes (Signed)
Patient name: Robert Sanchez MRN: [id number] DOB: 1955-12-26 Sex: male  REASON FOR VISIT: 1 month follow-up for occluded left leg bypass  HPI: Robert Sanchez is a 66 y.o. male with history of diabetes, coronary artery disease, peripheral vascular disease, tobacco abuse that presents for 1 month follow-up of occluded left leg bypass.  His most recent left common femoral to below-knee pop/TP trunk bypass with PTFE occluded in November last year.  He was not seen until he had symptom onset for about 4 to 6 weeks and was not a candidate for thrombolysis.  He was seen in the [facility] clinic at last visit in December and presents for follow-up to discuss with me.  States his left foot now is 10 out of 10 pain in the toes all the time.  He has a somewhat complex history in the left leg.  He initially underwent left common femoral to PT bypass with reversed saphenous vein in 2019 for CLI with rest pain.  Ultimately he presented to the hospital in March 2021 with an occluded bypass and underwent thrombolysis and subsequent percutaneous mechanical thrombectomy with angioplasty of the bypass and PT on 01/16/20- 01/17/2020.  Ultimately his bypass was salvaged and he was discharged on Eliquis.  During this event he did develop necrotic left great.  He then presented again and his bypass had occluded a second time in September 2021 and underwent second attempt thrombolysis that was unsuccessful.  Ultimately during an attempted thrombolysis we took him back he developed thrombus in the common femoral and iliac and required thrombectomy and ultimately we performed a left common femoral to PT composite bypass using the distal vein on the PT since this appeared to be healthy.  This then thrombosed immediately after hospital discharge.  Then on 08/04/2020 he underwent thrombectomy of the left common femoral to PT composite bypass.  We performed a left retrograde left external iliac angioplasty of an external iliac artery stent.   We then put a bovine patch on the below-knee popliteal artery TP trunk and revised the bypass to the below-knee popliteal artery TP trunk with PTFE.Marland Kitchen  He also got a partial left toe amp at this time.  He then presented with an occluded bypass and underwent thrombolysis on 01/07/2021 with angioplasty of the distal anastomosis onto the BK pop TP trunk on 01/08/2021.    Past Medical History:  Diagnosis Date   Acute pulmonary edema ([place]) 06/05/2018   Arthritis    COPD (chronic obstructive pulmonary disease) ([place])    " beginning stages " per pt   Coronary artery disease    Diabetes mellitus without complication ([place])    type 2   Diabetic neuropathy ([facility])    Diabetic neuropathy ([place])    Emphysema/COPD ([place])    " beginning stages"   GERD (gastroesophageal reflux disease)    HLD (hyperlipidemia)    HOH (hard of hearing)    no hearing aids   Myocardial infarction [facility])    Peripheral vascular disease ([place])     Past Surgical History:  Procedure Laterality Date   ABDOMINAL AORTOGRAM W/LOWER EXTREMITY N/A 06/21/2018   Procedure: ABDOMINAL AORTOGRAM W/LOWER EXTREMITY;  Surgeon: [name], MD;  Location: [place] [facility];  Service: Cardiovascular;  Laterality: N/A;   ABDOMINAL AORTOGRAM W/LOWER EXTREMITY Left 01/16/2020   Procedure: ABDOMINAL AORTOGRAM W/LOWER EXTREMITY;  Surgeon: [name], MD;  Location: [place] [facility];  Service: Cardiovascular;  Laterality: Left;   ABDOMINAL AORTOGRAM W/LOWER EXTREMITY N/A 07/09/2020  Procedure: ABDOMINAL AORTOGRAM W/LOWER EXTREMITY;  Surgeon: [name], MD;  Location: [place] [facility];  Service: Cardiovascular;  Laterality: N/A;  TPA infusion   AMPUTATION Left 08/04/2020   Procedure: LEFT PARTIAL GREAT TOE AMPUTATION;  Surgeon: [name], MD;  Location: [place];  Service: Vascular;  Laterality: Left;   APPLICATION OF WOUND VAC Left 08/04/2020   Procedure: LEFT GROIN DEBRIDEMENT WITH APPLICATION OF WOUND [place];  Surgeon:  [name], MD;  Location: [place];  Service: Vascular;  Laterality: Left;   CORONARY ARTERY BYPASS GRAFT N/A 06/12/2018   Procedure: CORONARY ARTERY BYPASS GRAFTING (CABG) x 3 WITH ENDOSCOPIC HARVESTING OF RIGHT GREATER SAPHENOUS VEIN. LIMA TO LAD. SVG TO PD. SVG TO DIAGONAL.;  Surgeon: Ivin Poot, MD;  Location: [place];  Service: Open Heart Surgery;  Laterality: N/A;   FEMORAL-POPLITEAL BYPASS GRAFT Left 07/10/2020   Procedure: REDO EXPOSURE OF LEFT COMMON FEMORAL ARTERY LEFT COMMON FEMORAL TO POSTERIOR TIBIAL COMPOSITE BYPASS GRAFT;  Surgeon: [name], MD;  Location: [place];  Service: Vascular;  Laterality: Left;   FEMORAL-TIBIAL BYPASS GRAFT Left 08/21/2018   Procedure: BYPASS GRAFT LEFT FEMORAL TO POSTERIOR TIBIAL ARTERY USING LEFT REVERSED GREAT SAPHENOUS VEIN;  Surgeon: [name], MD;  Location: [place];  Service: Vascular;  Laterality: Left;   FEMORAL-TIBIAL BYPASS GRAFT Left 08/04/2020   Procedure: REVISION FEMORAL-DISTAL BYPASS WITH BIOLOGICAL BOVINE PATCH;  Surgeon: [name], MD;  Location: [place];  Service: Vascular;  Laterality: Left;   KNEE ARTHROSCOPY     LOWER EXTREMITY ANGIOGRAM Left 08/04/2020   Procedure: LEFT ILIAC ANGIOGRAM, ANGIOPLASTY OF LEFT ILIAC STENT WITH DRUG COATED BALLOON;  Surgeon: [name], MD;  Location: [place];  Service: Vascular;  Laterality: Left;   LOWER EXTREMITY ANGIOGRAPHY Left 01/17/2020   Procedure: Lower Extremity Angiography;  Surgeon: [name], MD;  Location: [place] [facility];  Service: Cardiovascular;  Laterality: Left;   LOWER EXTREMITY ANGIOGRAPHY N/A 01/07/2021   Procedure: LOWER EXTREMITY ANGIOGRAPHY;  Surgeon: Cherre Robins, MD;  Location: [place] [facility];  Service: Cardiovascular;  Laterality: N/A;   PERIPHERAL VASCULAR BALLOON ANGIOPLASTY Left 01/17/2020   Procedure: PERIPHERAL VASCULAR BALLOON ANGIOPLASTY;  Surgeon: [name], MD;  Location: [place] [facility];  Service:  Cardiovascular;  Laterality: Left;  pt   PERIPHERAL VASCULAR INTERVENTION Left 06/21/2018   Procedure: PERIPHERAL VASCULAR INTERVENTION;  Surgeon: [name], MD;  Location: [place] [facility];  Service: Cardiovascular;  Laterality: Left;   PERIPHERAL VASCULAR INTERVENTION Left 01/08/2021   Procedure: PERIPHERAL VASCULAR INTERVENTION;  Surgeon: [name], MD;  Location: [place] [facility];  Service: Cardiovascular;  Laterality: Left;   PERIPHERAL VASCULAR THROMBECTOMY Left 01/16/2020   Procedure: PERIPHERAL VASCULAR THROMBECTOMY;  Surgeon: [name], MD;  Location: [place] [facility];  Service: Cardiovascular;  Laterality: Left;  Lytic Catheter Placement left fem-pop bypass   PERIPHERAL VASCULAR THROMBECTOMY Left 01/17/2020   Procedure: PERIPHERAL VASCULAR THROMBECTOMY;  Surgeon: [name], MD;  Location: [place] [facility];  Service: Cardiovascular;  Laterality: Left;  fem-pt bypass   PERIPHERAL VASCULAR THROMBECTOMY Left 07/10/2020   Procedure: lysis recheck;  Surgeon: [name], MD;  Location: [place] [facility];  Service: Cardiovascular;  Laterality: Left;   PULMONARY THROMBECTOMY N/A 01/08/2021   Procedure: LYSIS RECHECK;  Surgeon: [name], MD;  Location: [place] [facility];  Service: Cardiovascular;  Laterality: N/A;   RIGHT/LEFT HEART CATH AND  CORONARY ANGIOGRAPHY N/A 06/05/2018   Procedure: RIGHT/LEFT HEART CATH AND CORONARY ANGIOGRAPHY;  Surgeon: Dixie Dials, MD;  Location: [place] [facility];  Service: Cardiovascular;  Laterality: N/A;   SPINE SURGERY     TEE WITHOUT CARDIOVERSION N/A 06/12/2018   Procedure: TRANSESOPHAGEAL ECHOCARDIOGRAM (TEE);  Surgeon: Prescott Gum, Collier Salina, MD;  Location: [place];  Service: Open Heart Surgery;  Laterality: N/A;   teeth extractions     THROMBECTOMY FEMORAL ARTERY Left 08/04/2020   Procedure: THROMBECTOMY OF LEFT FEMORAL TP COMPOSTITE BYPASS;  Surgeon: [name], MD;  Location: [place];   Service: Vascular;  Laterality: Left;   THROMBECTOMY ILIAC ARTERY Left 07/10/2020   Procedure: THROMBECTOMY OF LEFT EXTERNAL ILIAC AND LEFT COMMON FEMORAL AND LEFT POSTERIOR TIBIAL ARTERIES;  Surgeon: [name], MD;  Location: [place];  Service: Vascular;  Laterality: Left;   VEIN HARVEST Left 08/21/2018   Procedure: VEIN HARVEST LEFT GREAT SAPHENOUS VEIN;  Surgeon: [name], MD;  Location: [place];  Service: Vascular;  Laterality: Left;    Family History  Problem Relation Age of Onset   Diabetes Mother    Lung disease Mother    Cancer Father    Heart disease Father     SOCIAL HISTORY: Social History   Tobacco Use   Smoking status: Some Days    Packs/day: 1.00    Years: 50.00    Pack years: 50.00    Types: Cigarettes    Last attempt to quit: 06/02/2018    Years since quitting: 3.4   Smokeless tobacco: Never   Tobacco comments:    1PPD x 50 years. 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   Substance Use Topics   Alcohol use: Yes    Comment: occasional    Allergies  Allergen Reactions   Lisinopril Other (See Comments)    Syncope   Codeine Rash    Current Outpatient Medications  Medication Sig Dispense Refill   Accu-Chek Softclix Lancets lancets Use to test blood sugars up to 4 times daily as directed. 100 each 5   aspirin 81 MG EC tablet Take 1 tablet (81 mg total) by mouth daily. Swallow whole.     atorvastatin (LIPITOR) 80 MG tablet Take 1 tablet (80 mg total) by mouth at bedtime. 30 tablet 3   Blood Glucose Monitoring Suppl (BLOOD GLUCOSE MONITOR SYSTEM) w/Device KIT Use to test blood sugars up to 4 times daily as directed. 1 kit 0   carvedilol (COREG) 6.25 MG tablet Take 1 tablet (6.25 mg total) by mouth 2 (two) times daily with a meal. 60 tablet 5   Dulaglutide (TRULICITY) 8.36 OQ/9.4TM SOPN Inject 0.75 mg into the skin once a week. 2 mL 3   furosemide (LASIX) 40 MG tablet Take 1 tablet (40 mg total) by mouth daily. 30 tablet 5   glucose blood test  strip Use to test blood sugars up to 4 times daily as directed. 100 each 5   insulin isophane & regular human KwikPen (HUMULIN 70/30 MIX) (70-30) 100 UNIT/ML KwikPen Inject 30 units with breakfast and dinner. 18 mL 5   Insulin Pen Needle 32G X 4 MM MISC Use as directed with insulin pen 200 each 0   losartan (COZAAR) 25 MG tablet Take 0.5 tablets (12.5 mg total) by mouth at bedtime. 45 tablet 3   metFORMIN (GLUCOPHAGE) 500 MG tablet Take 1 tablet (500 mg total) by mouth 2 (two) times daily with a meal. 60 tablet 5   Multiple Vitamin (MULTIVITAMIN)  tablet Take 1 tablet by mouth daily.     potassium chloride SA (KLOR-CON M) 20 MEQ tablet Take 1 tablet (20 mEq total) by mouth daily. 30 tablet 5   rivaroxaban (XARELTO) 20 MG TABS tablet Take 1 tablet (20 mg total) by mouth daily with supper. 30 tablet 5   spironolactone (ALDACTONE) 25 MG tablet Take 0.5 tablets (12.5 mg total) by mouth daily. 45 tablet 3   doxycycline (VIBRA-TABS) 100 MG tablet Take 1 tablet (100 mg total) by mouth 2 (two) times daily. (Patient not taking: Reported on 11/17/2021) 14 tablet 0   No current facility-administered medications for this visit.    REVIEW OF SYSTEMS:  '[X]'  denotes positive finding, '[ ]'  denotes negative finding Cardiac  Comments:  Chest pain or chest pressure:    Shortness of breath upon exertion:    Short of breath when lying flat:    Irregular heart rhythm:        Vascular    Pain in calf, thigh, or hip brought on by ambulation:    Pain in feet at night that wakes you up from your sleep:     Blood clot in your veins:    Leg swelling:         Pulmonary    Oxygen at home:    Productive cough:     Wheezing:         Neurologic    Sudden weakness in arms or legs:     Sudden numbness in arms or legs:     Sudden onset of difficulty speaking or slurred speech:    Temporary loss of vision in one eye:     Problems with dizziness:         Gastrointestinal    Blood in stool:     Vomited blood:          Genitourinary    Burning when urinating:     Blood in urine:        Psychiatric    Major depression:         Hematologic    Bleeding problems:    Problems with blood clotting too easily:        Skin    Rashes or ulcers:        Constitutional    Fever or chills:      PHYSICAL EXAM: Vitals:   11/17/21 0826  BP: 108/64  Pulse: 90  Resp: 18  Temp: 97.7 F (36.5 C)  TempSrc: Temporal  SpO2: 96%  Weight: 212 lb (96.2 kg)  Height: '5\' 9"'  (1.753 m)    GENERAL: The patient is a well-nourished male, in no acute distress. The vital signs are documented above. CARDIAC: There is a regular rate and rhythm.  VASCULAR:  Bilateral femoral pulses palpable No palpable right pedal pulses No palpable left pedal pulses Dependent rubor left foot Left great toe amputation previously well healed       DATA:   Left leg arterial duplex 10/13/2021 shows occluded left common femoral to TP trunk bypass.  ABIs on the left 0.45 monophasic.  Assessment/Plan:  66 year old male with complex history that most recently underwent thrombectomy of his left common femoral to PT composite bypass with retrograde iliac angioplasty and then revision of the distal bypass onto the below-knee popliteal artery/TP trunk with a bovine pericardial patch angioplasty.  This then required thrombolysis on 01/07/2021 and 01/08/2021 with angioplasty of the distal anastomosis.  His bypass occluded sometime in early November 2022 and he was  not seen in follow-up until about 4-6 weeks later and not a candidate for thrombolysis at that time.  Initially his symptoms were fairly tolerable and we elected observation.  On follow-up today he is now having 10 out of 10 rest pain in the left foot.  I discussed he really has limited options at this time and he should strongly consider amputation but he is not interested in amputation at this time and would like to try and go back to work given he has severe financial issues.  I  discussed we could put him on the schedule for Friday for a angiogram with my partner Dr. Stanford Breed to look for distal bypass target.  Then consider redo left common femoral to likely tibial bypass and this will be with PTFE which is going to limit the durability and be high risk for infection.  All this was discussed with the patient.  He is amendable to proceed.  I will put him on for a redo left common femoral to tibial bypass with me on Monday after angiogram on Friday.  I have asked that he hold his Xarelto.  Risks and benefits discussed.   [name], MD [facility] Office: [phone]

## 2021-11-17 NOTE — H&P (View-Only) (Signed)
Patient name: Robert Sanchez MRN: [id number] DOB: 1956/01/07 Sex: male  REASON FOR VISIT: 1 month follow-up for occluded left leg bypass  HPI: Robert Sanchez is a 66 y.o. male with history of diabetes, coronary artery disease, peripheral vascular disease, tobacco abuse that presents for 1 month follow-up of occluded left leg bypass.  His most recent left common femoral to below-knee pop/TP trunk bypass with PTFE occluded in November last year.  He was not seen until he had symptom onset for about 4 to 6 weeks and was not a candidate for thrombolysis.  He was seen in the [facility] clinic at last visit in December and presents for follow-up to discuss with me.  States his left foot now is 10 out of 10 pain in the toes all the time.  He has a somewhat complex history in the left leg.  He initially underwent left common femoral to PT bypass with reversed saphenous vein in 2019 for CLI with rest pain.  Ultimately he presented to the hospital in March 2021 with an occluded bypass and underwent thrombolysis and subsequent percutaneous mechanical thrombectomy with angioplasty of the bypass and PT on 01/16/20- 01/17/2020.  Ultimately his bypass was salvaged and he was discharged on Eliquis.  During this event he did develop necrotic left great.  He then presented again and his bypass had occluded a second time in September 2021 and underwent second attempt thrombolysis that was unsuccessful.  Ultimately during an attempted thrombolysis we took him back he developed thrombus in the common femoral and iliac and required thrombectomy and ultimately we performed a left common femoral to PT composite bypass using the distal vein on the PT since this appeared to be healthy.  This then thrombosed immediately after hospital discharge.  Then on 08/04/2020 he underwent thrombectomy of the left common femoral to PT composite bypass.  We performed a left retrograde left external iliac angioplasty of an external iliac artery stent.   We then put a bovine patch on the below-knee popliteal artery TP trunk and revised the bypass to the below-knee popliteal artery TP trunk with PTFE.Marland Kitchen  He also got a partial left toe amp at this time.  He then presented with an occluded bypass and underwent thrombolysis on 01/07/2021 with angioplasty of the distal anastomosis onto the BK pop TP trunk on 01/08/2021.    Past Medical History:  Diagnosis Date   Acute pulmonary edema ([place]) 06/05/2018   Arthritis    COPD (chronic obstructive pulmonary disease) ([place])    " beginning stages " per pt   Coronary artery disease    Diabetes mellitus without complication ([place])    type 2   Diabetic neuropathy ([facility])    Diabetic neuropathy ([place])    Emphysema/COPD ([place])    " beginning stages"   GERD (gastroesophageal reflux disease)    HLD (hyperlipidemia)    HOH (hard of hearing)    no hearing aids   Myocardial infarction [facility])    Peripheral vascular disease ([place])     Past Surgical History:  Procedure Laterality Date   ABDOMINAL AORTOGRAM W/LOWER EXTREMITY N/A 06/21/2018   Procedure: ABDOMINAL AORTOGRAM W/LOWER EXTREMITY;  Surgeon: [name], MD;  Location: [facility];  Service: Cardiovascular;  Laterality: N/A;   ABDOMINAL AORTOGRAM W/LOWER EXTREMITY Left 01/16/2020   Procedure: ABDOMINAL AORTOGRAM W/LOWER EXTREMITY;  Surgeon: [name], MD;  Location: [place] [facility];  Service: Cardiovascular;  Laterality: Left;   ABDOMINAL AORTOGRAM W/LOWER EXTREMITY N/A 07/09/2020  Procedure: ABDOMINAL AORTOGRAM W/LOWER EXTREMITY;  Surgeon: [name], MD;  Location: [place] [facility];  Service: Cardiovascular;  Laterality: N/A;  TPA infusion   AMPUTATION Left 08/04/2020   Procedure: LEFT PARTIAL GREAT TOE AMPUTATION;  Surgeon: [name], MD;  Location: [facility];  Service: Vascular;  Laterality: Left;   APPLICATION OF WOUND VAC Left 08/04/2020   Procedure: LEFT GROIN DEBRIDEMENT WITH APPLICATION OF WOUND [place];  Surgeon:  [name], MD;  Location: [facility];  Service: Vascular;  Laterality: Left;   CORONARY ARTERY BYPASS GRAFT N/A 06/12/2018   Procedure: CORONARY ARTERY BYPASS GRAFTING (CABG) x 3 WITH ENDOSCOPIC HARVESTING OF RIGHT GREATER SAPHENOUS VEIN. LIMA TO LAD. SVG TO PD. SVG TO DIAGONAL.;  Surgeon: Ivin Poot, MD;  Location: [facility];  Service: Open Heart Surgery;  Laterality: N/A;   FEMORAL-POPLITEAL BYPASS GRAFT Left 07/10/2020   Procedure: REDO EXPOSURE OF LEFT COMMON FEMORAL ARTERY LEFT COMMON FEMORAL TO POSTERIOR TIBIAL COMPOSITE BYPASS GRAFT;  Surgeon: [name], MD;  Location: [facility];  Service: Vascular;  Laterality: Left;   FEMORAL-TIBIAL BYPASS GRAFT Left 08/21/2018   Procedure: BYPASS GRAFT LEFT FEMORAL TO POSTERIOR TIBIAL ARTERY USING LEFT REVERSED GREAT SAPHENOUS VEIN;  Surgeon: [name], MD;  Location: [facility];  Service: Vascular;  Laterality: Left;   FEMORAL-TIBIAL BYPASS GRAFT Left 08/04/2020   Procedure: REVISION FEMORAL-DISTAL BYPASS WITH BIOLOGICAL BOVINE PATCH;  Surgeon: [name], MD;  Location: [place];  Service: Vascular;  Laterality: Left;   KNEE ARTHROSCOPY     LOWER EXTREMITY ANGIOGRAM Left 08/04/2020   Procedure: LEFT ILIAC ANGIOGRAM, ANGIOPLASTY OF LEFT ILIAC STENT WITH DRUG COATED BALLOON;  Surgeon: [name], MD;  Location: [facility] [facility];  Service: Vascular;  Laterality: Left;   LOWER EXTREMITY ANGIOGRAPHY Left 01/17/2020   Procedure: Lower Extremity Angiography;  Surgeon: [name], MD;  Location: [facility];  Service: Cardiovascular;  Laterality: Left;   LOWER EXTREMITY ANGIOGRAPHY N/A 01/07/2021   Procedure: LOWER EXTREMITY ANGIOGRAPHY;  Surgeon: Cherre Robins, MD;  Location: [facility];  Service: Cardiovascular;  Laterality: N/A;   PERIPHERAL VASCULAR BALLOON ANGIOPLASTY Left 01/17/2020   Procedure: PERIPHERAL VASCULAR BALLOON ANGIOPLASTY;  Surgeon: [name], MD;  Location: [facility];  Service:  Cardiovascular;  Laterality: Left;  pt   PERIPHERAL VASCULAR INTERVENTION Left 06/21/2018   Procedure: PERIPHERAL VASCULAR INTERVENTION;  Surgeon: [name], MD;  Location: [place];  Service: Cardiovascular;  Laterality: Left;   PERIPHERAL VASCULAR INTERVENTION Left 01/08/2021   Procedure: PERIPHERAL VASCULAR INTERVENTION;  Surgeon: [name], MD;  Location: [facility];  Service: Cardiovascular;  Laterality: Left;   PERIPHERAL VASCULAR THROMBECTOMY Left 01/16/2020   Procedure: PERIPHERAL VASCULAR THROMBECTOMY;  Surgeon: [name], MD;  Location: [facility];  Service: Cardiovascular;  Laterality: Left;  Lytic Catheter Placement left fem-pop bypass   PERIPHERAL VASCULAR THROMBECTOMY Left 01/17/2020   Procedure: PERIPHERAL VASCULAR THROMBECTOMY;  Surgeon: [name], MD;  Location: [facility] [facility];  Service: Cardiovascular;  Laterality: Left;  fem-pt bypass   PERIPHERAL VASCULAR THROMBECTOMY Left 07/10/2020   Procedure: lysis recheck;  Surgeon: [name], MD;  Location: [facility];  Service: Cardiovascular;  Laterality: Left;   PULMONARY THROMBECTOMY N/A 01/08/2021   Procedure: LYSIS RECHECK;  Surgeon: [name], MD;  Location: [facility];  Service: Cardiovascular;  Laterality: N/A;   RIGHT/LEFT HEART CATH AND  CORONARY ANGIOGRAPHY N/A 06/05/2018   Procedure: RIGHT/LEFT HEART CATH AND CORONARY ANGIOGRAPHY;  Surgeon: Dixie Dials, MD;  Location: [facility];  Service: Cardiovascular;  Laterality: N/A;   SPINE SURGERY     TEE WITHOUT CARDIOVERSION N/A 06/12/2018   Procedure: TRANSESOPHAGEAL ECHOCARDIOGRAM (TEE);  Surgeon: Prescott Gum, Collier Salina, MD;  Location: [facility];  Service: Open Heart Surgery;  Laterality: N/A;   teeth extractions     THROMBECTOMY FEMORAL ARTERY Left 08/04/2020   Procedure: THROMBECTOMY OF LEFT FEMORAL TP COMPOSTITE BYPASS;  Surgeon: [name], MD;  Location: [facility];   Service: Vascular;  Laterality: Left;   THROMBECTOMY ILIAC ARTERY Left 07/10/2020   Procedure: THROMBECTOMY OF LEFT EXTERNAL ILIAC AND LEFT COMMON FEMORAL AND LEFT POSTERIOR TIBIAL ARTERIES;  Surgeon: [name], MD;  Location: [facility];  Service: Vascular;  Laterality: Left;   VEIN HARVEST Left 08/21/2018   Procedure: VEIN HARVEST LEFT GREAT SAPHENOUS VEIN;  Surgeon: [name], MD;  Location: [facility];  Service: Vascular;  Laterality: Left;    Family History  Problem Relation Age of Onset   Diabetes Mother    Lung disease Mother    Cancer Father    Heart disease Father     SOCIAL HISTORY: Social History   Tobacco Use   Smoking status: Some Days    Packs/day: 1.00    Years: 50.00    Pack years: 50.00    Types: Cigarettes    Last attempt to quit: 06/02/2018    Years since quitting: 3.4   Smokeless tobacco: Never   Tobacco comments:    1PPD x 50 years. 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   Substance Use Topics   Alcohol use: Yes    Comment: occasional    Allergies  Allergen Reactions   Lisinopril Other (See Comments)    Syncope   Codeine Rash    Current Outpatient Medications  Medication Sig Dispense Refill   Accu-Chek Softclix Lancets lancets Use to test blood sugars up to 4 times daily as directed. 100 each 5   aspirin 81 MG EC tablet Take 1 tablet (81 mg total) by mouth daily. Swallow whole.     atorvastatin (LIPITOR) 80 MG tablet Take 1 tablet (80 mg total) by mouth at bedtime. 30 tablet 3   Blood Glucose Monitoring Suppl (BLOOD GLUCOSE MONITOR SYSTEM) w/Device KIT Use to test blood sugars up to 4 times daily as directed. 1 kit 0   carvedilol (COREG) 6.25 MG tablet Take 1 tablet (6.25 mg total) by mouth 2 (two) times daily with a meal. 60 tablet 5   Dulaglutide (TRULICITY) 7.02 OV/7.8HY SOPN Inject 0.75 mg into the skin once a week. 2 mL 3   furosemide (LASIX) 40 MG tablet Take 1 tablet (40 mg total) by mouth daily. 30 tablet 5   glucose blood test  strip Use to test blood sugars up to 4 times daily as directed. 100 each 5   insulin isophane & regular human KwikPen (HUMULIN 70/30 MIX) (70-30) 100 UNIT/ML KwikPen Inject 30 units with breakfast and dinner. 18 mL 5   Insulin Pen Needle 32G X 4 MM MISC Use as directed with insulin pen 200 each 0   losartan (COZAAR) 25 MG tablet Take 0.5 tablets (12.5 mg total) by mouth at bedtime. 45 tablet 3   metFORMIN (GLUCOPHAGE) 500 MG tablet Take 1 tablet (500 mg total) by mouth 2 (two) times daily with a meal. 60 tablet 5   Multiple Vitamin (MULTIVITAMIN)  tablet Take 1 tablet by mouth daily.     potassium chloride SA (KLOR-CON M) 20 MEQ tablet Take 1 tablet (20 mEq total) by mouth daily. 30 tablet 5   rivaroxaban (XARELTO) 20 MG TABS tablet Take 1 tablet (20 mg total) by mouth daily with supper. 30 tablet 5   spironolactone (ALDACTONE) 25 MG tablet Take 0.5 tablets (12.5 mg total) by mouth daily. 45 tablet 3   doxycycline (VIBRA-TABS) 100 MG tablet Take 1 tablet (100 mg total) by mouth 2 (two) times daily. (Patient not taking: Reported on 11/17/2021) 14 tablet 0   No current facility-administered medications for this visit.    REVIEW OF SYSTEMS:  '[X]'  denotes positive finding, '[ ]'  denotes negative finding Cardiac  Comments:  Chest pain or chest pressure:    Shortness of breath upon exertion:    Short of breath when lying flat:    Irregular heart rhythm:        Vascular    Pain in calf, thigh, or hip brought on by ambulation:    Pain in feet at night that wakes you up from your sleep:     Blood clot in your veins:    Leg swelling:         Pulmonary    Oxygen at home:    Productive cough:     Wheezing:         Neurologic    Sudden weakness in arms or legs:     Sudden numbness in arms or legs:     Sudden onset of difficulty speaking or slurred speech:    Temporary loss of vision in one eye:     Problems with dizziness:         Gastrointestinal    Blood in stool:     Vomited blood:          Genitourinary    Burning when urinating:     Blood in urine:        Psychiatric    Major depression:         Hematologic    Bleeding problems:    Problems with blood clotting too easily:        Skin    Rashes or ulcers:        Constitutional    Fever or chills:      PHYSICAL EXAM: Vitals:   11/17/21 0826  BP: 108/64  Pulse: 90  Resp: 18  Temp: 97.7 F (36.5 C)  TempSrc: Temporal  SpO2: 96%  Weight: 212 lb (96.2 kg)  Height: '5\' 9"'  (1.753 m)    GENERAL: The patient is a well-nourished male, in no acute distress. The vital signs are documented above. CARDIAC: There is a regular rate and rhythm.  VASCULAR:  Bilateral femoral pulses palpable No palpable right pedal pulses No palpable left pedal pulses Dependent rubor left foot Left great toe amputation previously well healed       DATA:   Left leg arterial duplex 10/13/2021 shows occluded left common femoral to TP trunk bypass.  ABIs on the left 0.45 monophasic.  Assessment/Plan:  66 year old male with complex history that most recently underwent thrombectomy of his left common femoral to PT composite bypass with retrograde iliac angioplasty and then revision of the distal bypass onto the below-knee popliteal artery/TP trunk with a bovine pericardial patch angioplasty.  This then required thrombolysis on 01/07/2021 and 01/08/2021 with angioplasty of the distal anastomosis.  His bypass occluded sometime in early November 2022 and he was  not seen in follow-up until about 4-6 weeks later and not a candidate for thrombolysis at that time.  Initially his symptoms were fairly tolerable and we elected observation.  On follow-up today he is now having 10 out of 10 rest pain in the left foot.  I discussed he really has limited options at this time and he should strongly consider amputation but he is not interested in amputation at this time and would like to try and go back to work given he has severe financial issues.  I  discussed we could put him on the schedule for Friday for a angiogram with my partner Dr. Stanford Breed to look for distal bypass target.  Then consider redo left common femoral to likely tibial bypass and this will be with PTFE which is going to limit the durability and be high risk for infection.  All this was discussed with the patient.  He is amendable to proceed.  I will put him on for a redo left common femoral to tibial bypass with me on Monday after angiogram on Friday.  I have asked that he hold his Xarelto.  Risks and benefits discussed.   [name], MD [facility] Office: [phone]

## 2021-11-19 NOTE — Progress Notes (Signed)
DUE TO COVID-19 ONLY ONE VISITOR IS ALLOWED TO COME WITH YOU AND STAY IN THE WAITING ROOM ONLY DURING PRE OP AND PROCEDURE DAY OF SURGERY.   Two VISITORS MAY VISIT WITH YOU AFTER SURGERY IN YOUR PRIVATE ROOM DURING VISITING HOURS ONLY!  PCP - Dr Alvis Lemmings  Cardiologist - Robbie Lis, PA-C Diabetes Mgmt - Dr Rosey Bath  Chest x-ray - 08/29/21 (1V) EKG - 08/27/21 Stress Test - 06/09/18 ECHO - 08/28/21 Cardiac Cath - 06/05/18  ICD Pacemaker/Loop - n/a  Sleep Study -  n/a CPAP - none  Do not take Metformin, Trulicity   THE NIGHT BEFORE SURGERY and DOS, take 15 units of Humulin 70/30 Insulin.     If your blood sugar is less than 70 mg/dL, you will need to treat for low blood sugar: Treat a low blood sugar (less than 70 mg/dL) with  cup of clear juice (cranberry or apple), 4 glucose tablets, OR glucose gel. Recheck blood sugar in 15 minutes after treatment (to make sure it is greater than 70 mg/dL). If your blood sugar is not greater than 70 mg/dL on recheck, call [phone] for further instructions.  Blood Thinner Instructions:  Hold Xarelto on DOS per MD's note in Epic on 11/17/21 and [place]'s note on 11/20/21. Last dose was on 11/16/21 per patient.  Aspirin Instructions: Follow your surgeon's instructions on when to stop aspirin prior to surgery,  If no instructions were given by your surgeon then you will need to call the office for those instructions. Continue ASA per MD.  Anesthesia review: Yes  STOP now taking any Aspirin (unless otherwise instructed by your surgeon), Aleve, Naproxen, Ibuprofen, Motrin, Advil, Goody's, BC's, all herbal medications, fish oil, and all vitamins.   Coronavirus Screening Covid test on DOS.  Do you have any of the following symptoms:  Cough yes/no: No Fever (>100.45F)  yes/no: No Runny nose yes/no: No Sore throat yes/no: No Difficulty breathing/shortness of breath  yes/no: No  Have you traveled in the last 14 days and where? yes/no:  No  Patient verbalized understanding of instructions that were given via.

## 2021-11-20 ENCOUNTER — Other Ambulatory Visit: Payer: Self-pay

## 2021-11-20 ENCOUNTER — Ambulatory Visit (HOSPITAL_COMMUNITY)
Admission: RE | Disposition: A | Discharge status: Home / Self Care | Payer: Self-pay | Source: Home / Self Care | Attending: Vascular Surgery

## 2021-11-20 ENCOUNTER — Ambulatory Visit (HOSPITAL_BASED_OUTPATIENT_CLINIC_OR_DEPARTMENT_OTHER): Payer: Self-pay

## 2021-11-20 ENCOUNTER — Ambulatory Visit (HOSPITAL_COMMUNITY)
Admission: RE | Admit: 2021-11-20 | Discharge: 2021-11-20 | Disposition: A | Discharge status: Home / Self Care | Payer: Self-pay | Attending: Vascular Surgery | Admitting: Vascular Surgery

## 2021-11-20 DIAGNOSIS — I2510 Atherosclerotic heart disease of native coronary artery without angina pectoris: Secondary | ICD-10-CM | POA: Insufficient documentation

## 2021-11-20 DIAGNOSIS — I70222 Atherosclerosis of native arteries of extremities with rest pain, left leg: Secondary | ICD-10-CM | POA: Insufficient documentation

## 2021-11-20 DIAGNOSIS — E1151 Type 2 diabetes mellitus with diabetic peripheral angiopathy without gangrene: Secondary | ICD-10-CM | POA: Insufficient documentation

## 2021-11-20 DIAGNOSIS — Z01810 Encounter for preprocedural cardiovascular examination: Secondary | ICD-10-CM

## 2021-11-20 DIAGNOSIS — Z87891 Personal history of nicotine dependence: Secondary | ICD-10-CM | POA: Insufficient documentation

## 2021-11-20 DIAGNOSIS — I739 Peripheral vascular disease, unspecified: Secondary | ICD-10-CM

## 2021-11-20 HISTORY — PX: ABDOMINAL AORTOGRAM W/LOWER EXTREMITY: CATH118223

## 2021-11-20 LAB — POCT I-STAT, CHEM 8
BUN: 21 mg/dL (ref 8–23)
Calcium, Ion: 1.24 mmol/L (ref 1.15–1.40)
Chloride: 105 mmol/L (ref 98–111)
Creatinine, Ser: 1.1 mg/dL (ref 0.61–1.24)
Glucose, Bld: 154 mg/dL — ABNORMAL HIGH (ref 70–99)
HCT: 41 % (ref 39.0–52.0)
Hemoglobin: 13.9 g/dL (ref 13.0–17.0)
Potassium: 4.6 mmol/L (ref 3.5–5.1)
Sodium: 141 mmol/L (ref 135–145)
TCO2: 26 mmol/L (ref 22–32)

## 2021-11-20 LAB — GLUCOSE, CAPILLARY
Glucose-Capillary: 176 mg/dL — ABNORMAL HIGH (ref 70–99)
Glucose-Capillary: 110 mg/dL — ABNORMAL HIGH (ref 70–99)

## 2021-11-20 SURGERY — ABDOMINAL AORTOGRAM W/LOWER EXTREMITY
Anesthesia: LOCAL | Laterality: Bilateral

## 2021-11-20 MED ORDER — LABETALOL HCL 5 MG/ML IV SOLN: 10.0000 mg | INTRAVENOUS | Status: DC | PRN

## 2021-11-20 MED ORDER — IODIXANOL 320 MG/ML IV SOLN
INTRAVENOUS | Status: DC | PRN
  Administered 2021-11-20: 115 mL

## 2021-11-20 MED ORDER — SODIUM CHLORIDE 0.9% FLUSH: 3.0000 mL | INTRAVENOUS | Status: DC | PRN

## 2021-11-20 MED ORDER — SODIUM CHLORIDE 0.9 % WEIGHT BASED INFUSION
1.0000 mL/kg/h | INTRAVENOUS | Status: DC
  Administered 2021-11-20: 1 mL/kg/h via INTRAVENOUS

## 2021-11-20 MED ORDER — MIDAZOLAM HCL 2 MG/2ML IJ SOLN
INTRAMUSCULAR | Status: DC | PRN
  Administered 2021-11-20: 1 mg via INTRAVENOUS

## 2021-11-20 MED ORDER — SODIUM CHLORIDE 0.9% FLUSH: 3.0000 mL | Freq: Two times a day (BID) | INTRAVENOUS | Status: DC

## 2021-11-20 MED ORDER — HYDRALAZINE HCL 20 MG/ML IJ SOLN: 5.0000 mg | INTRAMUSCULAR | Status: DC | PRN

## 2021-11-20 MED ORDER — ACETAMINOPHEN 325 MG PO TABS: 650.0000 mg | ORAL_TABLET | ORAL | Status: DC | PRN

## 2021-11-20 MED ORDER — FENTANYL CITRATE (PF) 100 MCG/2ML IJ SOLN
INTRAMUSCULAR | Status: DC | PRN
  Administered 2021-11-20: 50 ug via INTRAVENOUS

## 2021-11-20 MED ORDER — HEPARIN (PORCINE) IN NACL 1000-0.9 UT/500ML-% IV SOLN
INTRAVENOUS | Status: AC
  Filled 2021-11-20: qty 500

## 2021-11-20 MED ORDER — ONDANSETRON HCL 4 MG/2ML IJ SOLN: 4.0000 mg | Freq: Four times a day (QID) | INTRAMUSCULAR | Status: DC | PRN

## 2021-11-20 MED ORDER — LIDOCAINE HCL (PF) 1 % IJ SOLN
INTRAMUSCULAR | Status: AC
  Filled 2021-11-20: qty 30

## 2021-11-20 MED ORDER — MIDAZOLAM HCL 2 MG/2ML IJ SOLN
INTRAMUSCULAR | Status: AC
  Filled 2021-11-20: qty 2

## 2021-11-20 MED ORDER — FENTANYL CITRATE (PF) 100 MCG/2ML IJ SOLN
INTRAMUSCULAR | Status: AC
  Filled 2021-11-20: qty 2

## 2021-11-20 MED ORDER — LIDOCAINE HCL (PF) 1 % IJ SOLN
INTRAMUSCULAR | Status: DC | PRN
  Administered 2021-11-20: 12 mL

## 2021-11-20 MED ORDER — SODIUM CHLORIDE 0.9 % IV SOLN: 250.0000 mL | INTRAVENOUS | Status: DC | PRN

## 2021-11-20 MED ORDER — SODIUM CHLORIDE 0.9 % IV SOLN: INTRAVENOUS | Status: DC

## 2021-11-20 SURGICAL SUPPLY — 9 items
CATH OMNI FLUSH 5F 65CM (CATHETERS) ×1 IMPLANT
GUIDEWIRE ANGLED .035X150CM (WIRE) ×1 IMPLANT
KIT MICROPUNCTURE NIT STIFF (SHEATH) ×1 IMPLANT
KIT PV (KITS) ×2 IMPLANT
SHEATH PINNACLE 5F 10CM (SHEATH) ×1 IMPLANT
SYR MEDRAD MARK 7 150ML (SYRINGE) ×2 IMPLANT
TRANSDUCER W/STOPCOCK (MISCELLANEOUS) ×2 IMPLANT
TRAY PV CATH (CUSTOM PROCEDURE TRAY) ×2 IMPLANT
WIRE BENTSON .035X145CM (WIRE) ×1 IMPLANT

## 2021-11-20 NOTE — Progress Notes (Signed)
Anesthesia Chart Review:  Case: [id number] Date/Time: 11/23/21 0715   Procedure: REDO LEFT FEMORAL-TIBIAL ARTERY BYPASS GRAFT (Left) - Insert arterial line   Anesthesia type: General   Pre-op diagnosis: Critical limb ischemia of left lower extremity   Location: [facility] OR ROOM 11 / [place] OR   Surgeons: [name], MD       DISCUSSION: Patient is a 66 year old male scheduled for the above procedure.  History includes smoking, COPD, CAD (inferior NSTEMI, s/p CABG x3 06/12/18: LIMA-LAD, SVG-DIAG, SVG-PLA), ischemic cardiomyopathy,  chronic systolic CHF, DM2 (with neuropathy), HLD, GERD, PAD (left EIA stent 06/21/18; left CFA-PTA bypass with vein graft 08/21/18; thrombolysis /mechanical thrombectomy with left CFA-PTA bypass with angioplasty 01/17/20; unsuccessful thrombolysis with left EIA /CFA & PTA thrombectomy and redo left CFA-PTA bypass with composite 6 mm ringed PTFE 07/10/20; thrombectomy, angioplasty left EIA stent, left popliteal/TP trunk patch angioplasty, left CFA-below knee popliteal bypass with PTFE, partial left great toe amputation 08/04/20; thrombolysis, angioplasty distal bypass 01/08/21), spinal slurgery,  Left EIA stent 06/21/18  - [place] admission 08/27/21-08/31/21 for acute hypoxic respiratory failure due to acute on chronic HFrEF in setting of inconsistent medication compliance. BP 166/102, HR 120, RR 26, O2 sat initially in the 70's, up to 95% on CPAP/NRB. He rapidly improved with diuresis. Minimally elevated troponin felt consistent with demand ischemia and not ACS. No chest pain. He also required IV insulin for hyperglycemia (A1c 13.3) and then transitioned to Lantus/NovoLog regimen and metformin. EF 35-40%, global hypokinesis, moderate reduced RV systolic function (previously EF 25-30%). Coreg and Lasix resumed. Entresto/ARB not initiated due to previous hypotension on ACEi, but out-patient Cardiology follow-up arranged.   Last cardiology evaluation was on 09/08/21 by Lyda Jester, PA-C at the [facility] for HF Consultation.  He had not been seen by his primary cardiologist Dr. Doylene Canard in ~ 3 years. He was working as a Development worker, community and thinking about retiring due to limitations from his PAD, but would not have insurance. He reported medication compliance since his 08/2021 hospitalization. Volume statue okay at that time. Added spironolactone and Losartan. Referred to HF social work referral for  financial counseling. Follow-up in [facility] clinic in 1 week on 09/14/21 planned, but instead he was in the ED with foot pain and redness. He did have primary care follow-up on 09/21/21 and 10/05/21.   Last visit with PCP Dr. Margarita Rana on 10/05/21. He was doing well from a respiratory and cardiac standpoint. No hypoglycemic episodes on insulin. Trulicity added.  Left foot still with some discoloration, but less painful and was to follow-up with [facility] the next week.. 3 month primary care follow-up planned. His last A1c was 13.3% on 08/28/21, but now more compliant on new DM regimen. At last visit, Dr. Margarita Rana notes his "blood sugars are starting to show some improvement." He is on Humulin 70/30 30 units BID, metformin 500 mg BID, and was started on Trulicity 8.24 mg weekly last month.   Seen by vascular on 10/13/21 by Leontine Locket, PA-C. LLE arterial duplex showed his LLE bypass was occluded, likely one month prior when he first noted new symptoms. He was not a candidate for thrombolysis given length of time occluded. He did have a monophasic doppler signal in his left foot. Follow-up arranged with Dr. Carlis Abbott on 11/17/20 to discuss management options including observation if stable symptoms versus out-patient angiogram to evaluate for potential re-do bypass options or amputation if no revascularization options. At his 10/17/21 visit,  he had progression symptoms with 10/10 rest pain. Angiogram set up for 11/23/20 and based on results the above procedure was recommended.    He is on Xarelto for PAD. Per [facility], continue ASA, but hold Xarelto for surgery.  Anesthesia team to evaluate on the day of surgery.   VS: Ht '5\' 9"'  (1.753 m)    Wt 98.9 kg    BMI 32.19 kg/m  BP Readings from Last 3 Encounters:  11/20/21 123/65  11/17/21 108/64  10/13/21 133/76   Pulse Readings from Last 3 Encounters:  11/20/21 79  11/17/21 90  10/13/21 75    PROVIDERS: Charlott Rakes, MD is PCP ([place]) - Last cardiology evaluation was on 09/08/22 with Lyda Jester, PA-C with [facility]. She notes he previously saw Dixie Dials, MD but not in ~ 3 years. See DISCUSSION.     LABS: Labs as indicated on the day of surgery. Currently, the most recent results include: Lab Results  Component Value Date   WBC 11.2 (H) 09/14/2021   HGB 13.9 11/20/2021   HCT 41.0 11/20/2021   PLT 401 (H) 09/14/2021   GLUCOSE 154 (H) 11/20/2021   ALT 18 01/06/2021   AST 16 01/06/2021   NA 141 11/20/2021   K 4.6 11/20/2021   CL 105 11/20/2021   CREATININE 1.10 11/20/2021   BUN 21 11/20/2021   CO2 23 09/14/2021   HGBA1C 13.3 (H) 08/28/2021     IMAGES: 1V PCXR 08/29/21: FINDINGS: Transverse diameter of heart is increased. There is evidence of previous coronary bypass surgery. There is interval decrease in interstitial markings in both lungs. There is no new focal pulmonary infiltrate. There is no significant pleural effusion or pneumothorax. IMPRESSION: There is interval decrease in interstitial markings in both lungs suggesting resolving pulmonary edema. No new focal pulmonary infiltrates are seen.   EKG: 08/27/21: Sinus tachycardia at 118 bpm Multiform ventricular premature complexes Probable left atrial enlargement Left posterior fascicular block Otherwise no significant change Confirmed by Deno Etienne [phone]) on 08/27/2021 10:59:31 PM   CV: Aortogram with LLE [place] 11/20/21: OPERATIVE FINDINGS:  - Terminal aorta and iliac  arteries: - Patent without flow limiting stenosis - Left external iliac stent patent - Left lower extremity: Common femoral artery: >60% stenosis; patent  Profunda femoris artery: patent withotu stenosis  Superficial femoral artery: proximal stump patent; rapidly occludes. Popliteal artery: reconstitution of severely diseased popliteal artery above the knee and behind the knee. Below knee popliteal artery occluded Anterior tibial artery: occluded at its origin. Reconstitution in the proximal calf. Dominant tibial vessel, filling the foot Tibioperoneal trunk: occluded Peroneal artery: occluded proximally; reconstitutes in mid calf Posterior tibial artery: occluded proximally; reconstitutes in mid calf  Pedal circulation: fills via AT PLAN: femoral endarterectomy and femoral-tibial bypass next week with Dr. Carlis Abbott.   Echo 08/28/21: IMPRESSIONS   1. Left ventricular ejection fraction, by estimation, is 35 to 40%. The  left ventricle has moderately decreased function. The left ventricle  demonstrates global hypokinesis. The left ventricular internal cavity size  was moderately dilated. Left  ventricular diastolic parameters are consistent with Grade II diastolic  dysfunction (pseudonormalization). Elevated left ventricular end-diastolic  pressure.   2. Right ventricular systolic function is moderately reduced. The right  ventricular size is mildly enlarged.   3. Left atrial size was mildly dilated.   4. Right atrial size was mildly dilated.   5. The mitral valve is grossly normal. Trivial mitral valve  regurgitation.   6. The aortic  valve is tricuspid. Aortic valve regurgitation is not  visualized.  - Comparison: LVEF 25-30% 06/12/18 TEE; LVEF 20-25%, diffuse LV hypokinesis, normal RVF, moderate MR 06/05/28 TTE)    US Carotid 06/09/18: Final Interpretation:  - Right Carotid: Velocities in the right ICA are consistent with a 1-39%  stenosis.  - Left Carotid: Velocities in the left ICA  are consistent with a 1-39%  stenosis.  - Vertebrals:  Bilateral vertebral arteries demonstrate antegrade flow.  - Subclavians: Normal flow hemodynamics were seen in bilateral subclavian arteries.    S/p CABG x3 06/12/18: LIMA-LAD, SVG-DIAG, SVG-PLA.   Nuclear stress test 06/09/18: IMPRESSION: 1. Inferior wall infarction.  No evidence reversible ischemia. 2. LEFT ventricular dilatation and inferior wall hypokinesia. 3. Left ventricular ejection fraction 29% 4. Non invasive risk stratification: High (predominately based on low ejection fraction).    Cardiac cath 06/05/18: Prox RCA to Mid RCA lesion is 100% stenosed. Prox Cx lesion is 100% stenosed. Dist LM lesion is 50% stenosed. Ost LAD to Prox LAD lesion is 70% stenosed. 2nd Mrg lesion is 80% stenosed. Ost RPDA to RPDA lesion is 80% stenosed. Dist RCA lesion is 80% stenosed. Post Atrio lesion is 100% stenosed. Dist LAD lesion is 70% stenosed. LV end diastolic pressure is normal.   CVTS consult for CABG post viability test. Lifestyle modification.  Past Medical History:  Diagnosis Date   Acute pulmonary edema ([place]) 06/05/2018   Arthritis    COPD (chronic obstructive pulmonary disease) ([place])    " beginning stages " per pt   Coronary artery disease    Diabetes mellitus without complication ([place])    type 2   Diabetic neuropathy ([facility])    Diabetic neuropathy ([place])    Emphysema/COPD ([place])    " beginning stages"   GERD (gastroesophageal reflux disease)    HLD (hyperlipidemia)    HOH (hard of hearing)    no hearing aids   Myocardial infarction [facility])    Peripheral vascular disease ([place])     Past Surgical History:  Procedure Laterality Date   ABDOMINAL AORTOGRAM W/LOWER EXTREMITY N/A 06/21/2018   Procedure: ABDOMINAL AORTOGRAM W/LOWER EXTREMITY;  Surgeon: [name], MD;  Location: [place] [facility];  Service: Cardiovascular;  Laterality: N/A;   ABDOMINAL AORTOGRAM W/LOWER EXTREMITY Left 01/16/2020   Procedure:  ABDOMINAL AORTOGRAM W/LOWER EXTREMITY;  Surgeon: [name], MD;  Location: [place] [facility];  Service: Cardiovascular;  Laterality: Left;   ABDOMINAL AORTOGRAM W/LOWER EXTREMITY N/A 07/09/2020   Procedure: ABDOMINAL AORTOGRAM W/LOWER EXTREMITY;  Surgeon: [name], MD;  Location: [place] [facility];  Service: Cardiovascular;  Laterality: N/A;  TPA infusion   AMPUTATION Left 08/04/2020   Procedure: LEFT PARTIAL GREAT TOE AMPUTATION;  Surgeon: [name], MD;  Location: [place];  Service: Vascular;  Laterality: Left;   APPLICATION OF WOUND VAC Left 08/04/2020   Procedure: LEFT GROIN DEBRIDEMENT WITH APPLICATION OF WOUND [place];  Surgeon: [name], MD;  Location: [place];  Service: Vascular;  Laterality: Left;   CORONARY ARTERY BYPASS GRAFT N/A 06/12/2018   Procedure: CORONARY ARTERY BYPASS GRAFTING (CABG) x 3 WITH ENDOSCOPIC HARVESTING OF RIGHT GREATER SAPHENOUS VEIN. LIMA TO LAD. SVG TO PD. SVG TO DIAGONAL.;  Surgeon: Ivin Poot, MD;  Location: [place];  Service: Open Heart Surgery;  Laterality: N/A;   FEMORAL-POPLITEAL BYPASS GRAFT Left 07/10/2020   Procedure: REDO EXPOSURE OF LEFT COMMON FEMORAL ARTERY LEFT COMMON FEMORAL TO POSTERIOR TIBIAL COMPOSITE BYPASS GRAFT;  Surgeon: Monica Martinez  J, MD;  Location: [place];  Service: Vascular;  Laterality: Left;   FEMORAL-TIBIAL BYPASS GRAFT Left 08/21/2018   Procedure: BYPASS GRAFT LEFT FEMORAL TO POSTERIOR TIBIAL ARTERY USING LEFT REVERSED GREAT SAPHENOUS VEIN;  Surgeon: [name], MD;  Location: [place];  Service: Vascular;  Laterality: Left;   FEMORAL-TIBIAL BYPASS GRAFT Left 08/04/2020   Procedure: REVISION FEMORAL-DISTAL BYPASS WITH BIOLOGICAL BOVINE PATCH;  Surgeon: [name], MD;  Location: [facility] OR;  Service: Vascular;  Laterality: Left;   KNEE ARTHROSCOPY     LOWER EXTREMITY ANGIOGRAM Left 08/04/2020   Procedure: LEFT ILIAC ANGIOGRAM, ANGIOPLASTY OF LEFT ILIAC STENT WITH DRUG COATED BALLOON;   Surgeon: [name], MD;  Location: [place];  Service: Vascular;  Laterality: Left;   LOWER EXTREMITY ANGIOGRAPHY Left 01/17/2020   Procedure: Lower Extremity Angiography;  Surgeon: [name], MD;  Location: [place] [facility];  Service: Cardiovascular;  Laterality: Left;   LOWER EXTREMITY ANGIOGRAPHY N/A 01/07/2021   Procedure: LOWER EXTREMITY ANGIOGRAPHY;  Surgeon: Cherre Robins, MD;  Location: [place] [facility];  Service: Cardiovascular;  Laterality: N/A;   PERIPHERAL VASCULAR BALLOON ANGIOPLASTY Left 01/17/2020   Procedure: PERIPHERAL VASCULAR BALLOON ANGIOPLASTY;  Surgeon: [name], MD;  Location: [place] [facility];  Service: Cardiovascular;  Laterality: Left;  pt   PERIPHERAL VASCULAR INTERVENTION Left 06/21/2018   Procedure: PERIPHERAL VASCULAR INTERVENTION;  Surgeon: [name], MD;  Location: [place] [facility];  Service: Cardiovascular;  Laterality: Left;   PERIPHERAL VASCULAR INTERVENTION Left 01/08/2021   Procedure: PERIPHERAL VASCULAR INTERVENTION;  Surgeon: [name], MD;  Location: [place] [facility];  Service: Cardiovascular;  Laterality: Left;   PERIPHERAL VASCULAR THROMBECTOMY Left 01/16/2020   Procedure: PERIPHERAL VASCULAR THROMBECTOMY;  Surgeon: [name], MD;  Location: [place] [facility];  Service: Cardiovascular;  Laterality: Left;  Lytic Catheter Placement left fem-pop bypass   PERIPHERAL VASCULAR THROMBECTOMY Left 01/17/2020   Procedure: PERIPHERAL VASCULAR THROMBECTOMY;  Surgeon: [name], MD;  Location: [place] [facility];  Service: Cardiovascular;  Laterality: Left;  fem-pt bypass   PERIPHERAL VASCULAR THROMBECTOMY Left 07/10/2020   Procedure: lysis recheck;  Surgeon: [name], MD;  Location: [place] [facility];  Service: Cardiovascular;  Laterality: Left;   PULMONARY THROMBECTOMY N/A 01/08/2021   Procedure: LYSIS RECHECK;  Surgeon: [name], MD;  Location: [place] [facility];   Service: Cardiovascular;  Laterality: N/A;   RIGHT/LEFT HEART CATH AND CORONARY ANGIOGRAPHY N/A 06/05/2018   Procedure: RIGHT/LEFT HEART CATH AND CORONARY ANGIOGRAPHY;  Surgeon: Dixie Dials, MD;  Location: [place] [facility];  Service: Cardiovascular;  Laterality: N/A;   SPINE SURGERY     TEE WITHOUT CARDIOVERSION N/A 06/12/2018   Procedure: TRANSESOPHAGEAL ECHOCARDIOGRAM (TEE);  Surgeon: Prescott Gum, Collier Salina, MD;  Location: [place];  Service: Open Heart Surgery;  Laterality: N/A;   teeth extractions     THROMBECTOMY FEMORAL ARTERY Left 08/04/2020   Procedure: THROMBECTOMY OF LEFT FEMORAL TP COMPOSTITE BYPASS;  Surgeon: [name], MD;  Location: [place];  Service: Vascular;  Laterality: Left;   THROMBECTOMY ILIAC ARTERY Left 07/10/2020   Procedure: THROMBECTOMY OF LEFT EXTERNAL ILIAC AND LEFT COMMON FEMORAL AND LEFT POSTERIOR TIBIAL ARTERIES;  Surgeon: [name], MD;  Location: [place];  Service: Vascular;  Laterality: Left;   VEIN HARVEST Left 08/21/2018   Procedure: VEIN HARVEST LEFT GREAT SAPHENOUS VEIN;  Surgeon: [name], MD;  Location: [place];  Service:  Vascular;  Laterality: Left;    MEDICATIONS: No current facility-administered medications for this encounter.    Accu-Chek Softclix Lancets lancets   aspirin 81 MG EC tablet   atorvastatin (LIPITOR) 80 MG tablet   Blood Glucose Monitoring Suppl (BLOOD GLUCOSE MONITOR SYSTEM) w/Device KIT   carvedilol (COREG) 6.25 MG tablet   doxycycline (VIBRA-TABS) 100 MG tablet   Dulaglutide (TRULICITY) 7.85 YI/5.0YD SOPN   furosemide (LASIX) 40 MG tablet   glucose blood test strip   insulin isophane & regular human KwikPen (HUMULIN 70/30 MIX) (70-30) 100 UNIT/ML KwikPen   Insulin Pen Needle 32G X 4 MM MISC   losartan (COZAAR) 25 MG tablet   metFORMIN (GLUCOPHAGE) 500 MG tablet   Multiple Vitamin (MULTIVITAMIN) tablet   potassium chloride SA (KLOR-CON M) 20 MEQ tablet   rivaroxaban (XARELTO) 20 MG TABS tablet    spironolactone (ALDACTONE) 25 MG tablet    0.9 %  sodium chloride infusion   0.9 %  sodium chloride infusion   0.9% sodium chloride infusion   acetaminophen (TYLENOL) tablet 650 mg   fentaNYL (SUBLIMAZE) injection   hydrALAZINE (APRESOLINE) injection 5 mg   iodixanol (VISIPAQUE) 320 MG/ML injection   labetalol (NORMODYNE) injection 10 mg   lidocaine (PF) (XYLOCAINE) 1 % injection   midazolam (VERSED) injection   ondansetron (ZOFRAN) injection 4 mg   sodium chloride flush (NS) 0.9 % injection 3 mL   sodium chloride flush (NS) 0.9 % injection 3 mL    Myra Gianotti, PA-C Surgical Short Stay/Anesthesiology [facility] Phone [phone] [facility] Phone [phone] 11/20/2021 2:52 PM

## 2021-11-20 NOTE — Interval H&P Note (Signed)
History and Physical Interval Note:  11/20/2021 7:29 AM  Robert Sanchez  has presented today for surgery, with the diagnosis of ischemia.  The various methods of treatment have been discussed with the patient and family. After consideration of risks, benefits and other options for treatment, the patient has consented to  Procedure(s): ABDOMINAL AORTOGRAM W/LOWER EXTREMITY (N/A) as a surgical intervention.  The patient's history has been reviewed, patient examined, no change in status, stable for surgery.  I have reviewed the patient's chart and labs.  Questions were answered to the patient's satisfaction.     Leonie Douglas

## 2021-11-20 NOTE — Progress Notes (Signed)
BUE cephalic mapping has been completed.  Results can be found under chart review under CV PROC. 11/20/2021 1:50 PM Havard Radigan RVT, RDMS

## 2021-11-20 NOTE — Anesthesia Preprocedure Evaluation (Addendum)
Anesthesia Evaluation  Patient identified by MRN, date of birth, ID band Patient awake    Reviewed: Allergy & Precautions, NPO status , Patient's Chart, lab work & pertinent test results  Airway Mallampati: II  TM Distance: >3 FB Neck ROM: Full    Dental  (+) Dental Advisory Given, Edentulous Upper, Edentulous Lower   Pulmonary neg pulmonary ROS, COPD, Current Smoker and Patient abstained from smoking.,    Pulmonary exam normal breath sounds clear to auscultation       Cardiovascular hypertension, Pt. on medications and Pt. on home beta blockers + CAD, + Past MI, + CABG, + Peripheral Vascular Disease and +CHF  Normal cardiovascular exam Rhythm:Regular Rate:Normal  08/2021 ECHO: EF 35-40%, mod decreased LVF, mod dilated LV, grade 2 DD, mod reduced RVF, trivial MR   Neuro/Psych negative neurological ROS     GI/Hepatic negative GI ROS, Neg liver ROS, GERD  ,  Endo/Other  negative endocrine ROSdiabetes, Insulin Dependent, Oral Hypoglycemic Agents  Renal/GU Renal disease     Musculoskeletal  (+) Arthritis ,   Abdominal (+) + obese,   Peds  Hematology negative hematology ROS (+) xarelto   Anesthesia Other Findings   Reproductive/Obstetrics                          Anesthesia Physical Anesthesia Plan  ASA: 4  Anesthesia Plan: General   Post-op Pain Management: Tylenol PO (pre-op) and Minimal or no pain anticipated   Induction: Intravenous  PONV Risk Score and Plan: 2 and Ondansetron, Dexamethasone, Treatment may vary due to age or medical condition and Midazolam  Airway Management Planned: Oral ETT  Additional Equipment: Arterial line  Intra-op Plan:   Post-operative Plan: Extubation in OR  Informed Consent: I have reviewed the patients History and Physical, chart, labs and discussed the procedure including the risks, benefits and alternatives for the proposed anesthesia with the patient  or authorized representative who has indicated his/her understanding and acceptance.     Dental advisory given  Plan Discussed with: CRNA  Anesthesia Plan Comments: (See PAT note written 11/20/2021 by Shonna Chock, PA-C. )     Anesthesia Quick Evaluation

## 2021-11-20 NOTE — Progress Notes (Signed)
SITE AREA: right groin/femoral  SITE PRIOR TO REMOVAL:  LEVEL 0  PRESSURE APPLIED FOR: approximately 20 minutes  MANUAL: yes  PATIENT STATUS DURING PULL: stable  POST PULL SITE:  LEVEL 0  POST PULL INSTRUCTIONS GIVEN: yes  POST PULL PULSES PRESENT: +1 pedal pulse noted  DRESSING APPLIED: gauze with tegaderm   BEDREST BEGINS @ 0902  COMMENTS: pinkened, hardened, dime size area about 1 inch below insertion area noted, no drainage present, (per PV lab) area already present prior to procedure

## 2021-11-20 NOTE — Op Note (Signed)
DATE OF SERVICE: 11/20/2021  PATIENT:  Robert Sanchez  66 y.o. 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 Lenell Antu, MD  ASSISTANT: none  ANESTHESIA:   local and IV sedation  ESTIMATED BLOOD LOSS: minimal  LOCAL MEDICATIONS USED:  LIDOCAINE   COUNTS: confirmed correct.  PATIENT DISPOSITION:  PACU - hemodynamically stable.   Delay start of Pharmacological VTE agent (>24hrs) due to surgical blood loss or risk of bleeding: no  INDICATION FOR PROCEDURE: Robert Sanchez is a 66 y.o. male with complex past vascular history in need of re-do bypass for ischemic rest pain. After careful discussion of risks, benefits, and alternatives the patient was offered angiogram. The patient understood and wished to proceed.  OPERATIVE FINDINGS:  Terminal aorta and iliac arteries: Patent without flow limiting stenosis Left external iliac stent patent  Left lower extremity: Common femoral artery: >60% stenosis; patent  Profunda femoris artery: patent withotu stenosis  Superficial femoral artery: proximal stump patent; rapidly occludes. Popliteal artery: reconstitution of severely diseased popliteal artery above the knee and behind the knee. Below knee popliteal artery occluded Anterior tibial artery: occluded at its origin. Reconstitution in the proximal calf. Dominant tibial vessel, filling the foot Tibioperoneal trunk: occluded Peroneal artery: occluded proximally; reconstitutes in mid calf Posterior tibial artery: occluded proximally; reconstitutes in mid calf Pedal circulation: fills via AT  DESCRIPTION OF PROCEDURE: After identification of the patient in the  pre-operative holding area, the patient was transferred to the operating room. The patient was positioned supine on the operating room table. Anesthesia was induced. The groins was prepped and draped in standard fashion. A surgical pause was performed confirming correct patient, procedure, and operative location.  The right groin was anesthetized with subcutaneous injection of 1% lidocaine. Using ultrasound guidance, the right common femoral artery was accessed with micropuncture technique. Fluoroscopy was used to confirm cannulation over the femoral head. The 58F sheath was upsized to 10F.   A Benson wire was advanced into the distal aorta. Over the wire an omni flush catheter was advanced to the level of L2. Aortogram was performed - see above for details.   The left common iliac artery was selected with a guidewire. The wire was advanced into the common femoral artery. Over the wire the omni flush catheter was advanced into the external iliac artery. Selective angiography was performed - see above for details.   The sheath was left in place to be removed in the PACU.  Conscious sedation was administered with the use of IV fentanyl and midazolam under continuous physician and nurse monitoring.  Heart rate, blood pressure, and oxygen saturation were continuously monitored.  Total sedation time was 31 minutes  Upon completion of the case instrument and sharps counts were confirmed correct. The patient was transferred to the PACU in good condition. I was present for all portions of the procedure.  PLAN: femoral endarterectomy and femoral-tibial bypass next week with Dr. Chestine Spore.  Rande Brunt. Lenell Antu, MD [facility] Phone Number: [phone] 11/20/2021 8:15 AM

## 2021-11-23 ENCOUNTER — Inpatient Hospital Stay (HOSPITAL_COMMUNITY): Payer: Self-pay | Admitting: Vascular Surgery

## 2021-11-23 ENCOUNTER — Other Ambulatory Visit: Payer: Self-pay

## 2021-11-23 ENCOUNTER — Encounter (HOSPITAL_COMMUNITY)
Admission: RE | Disposition: A | Discharge status: Home / Self Care | Payer: Self-pay | Source: Home / Self Care | Attending: Vascular Surgery

## 2021-11-23 ENCOUNTER — Encounter (HOSPITAL_COMMUNITY): Payer: Self-pay | Admitting: Vascular Surgery

## 2021-11-23 ENCOUNTER — Inpatient Hospital Stay (HOSPITAL_COMMUNITY)
Admission: RE | Admit: 2021-11-23 | Discharge: 2021-11-26 | DRG: 253 | Disposition: A | Discharge status: Home / Self Care | Payer: Self-pay | Attending: Vascular Surgery | Admitting: Vascular Surgery

## 2021-11-23 DIAGNOSIS — Z8249 Family history of ischemic heart disease and other diseases of the circulatory system: Secondary | ICD-10-CM

## 2021-11-23 DIAGNOSIS — Z20822 Contact with and (suspected) exposure to covid-19: Secondary | ICD-10-CM | POA: Diagnosis present

## 2021-11-23 DIAGNOSIS — Z809 Family history of malignant neoplasm, unspecified: Secondary | ICD-10-CM

## 2021-11-23 DIAGNOSIS — I739 Peripheral vascular disease, unspecified: Secondary | ICD-10-CM | POA: Diagnosis present

## 2021-11-23 DIAGNOSIS — T82858A Stenosis of vascular prosthetic devices, implants and grafts, initial encounter: Secondary | ICD-10-CM

## 2021-11-23 DIAGNOSIS — I70222 Atherosclerosis of native arteries of extremities with rest pain, left leg: Secondary | ICD-10-CM

## 2021-11-23 DIAGNOSIS — I255 Ischemic cardiomyopathy: Secondary | ICD-10-CM | POA: Diagnosis present

## 2021-11-23 DIAGNOSIS — J439 Emphysema, unspecified: Secondary | ICD-10-CM | POA: Diagnosis present

## 2021-11-23 DIAGNOSIS — Z7901 Long term (current) use of anticoagulants: Secondary | ICD-10-CM

## 2021-11-23 DIAGNOSIS — M1990 Unspecified osteoarthritis, unspecified site: Secondary | ICD-10-CM | POA: Diagnosis present

## 2021-11-23 DIAGNOSIS — E1151 Type 2 diabetes mellitus with diabetic peripheral angiopathy without gangrene: Secondary | ICD-10-CM | POA: Diagnosis present

## 2021-11-23 DIAGNOSIS — I248 Other forms of acute ischemic heart disease: Secondary | ICD-10-CM | POA: Diagnosis present

## 2021-11-23 DIAGNOSIS — Z87891 Personal history of nicotine dependence: Secondary | ICD-10-CM

## 2021-11-23 DIAGNOSIS — I252 Old myocardial infarction: Secondary | ICD-10-CM

## 2021-11-23 DIAGNOSIS — E785 Hyperlipidemia, unspecified: Secondary | ICD-10-CM | POA: Diagnosis present

## 2021-11-23 DIAGNOSIS — Z833 Family history of diabetes mellitus: Secondary | ICD-10-CM

## 2021-11-23 DIAGNOSIS — Z794 Long term (current) use of insulin: Secondary | ICD-10-CM

## 2021-11-23 DIAGNOSIS — E1140 Type 2 diabetes mellitus with diabetic neuropathy, unspecified: Secondary | ICD-10-CM | POA: Diagnosis present

## 2021-11-23 DIAGNOSIS — I2510 Atherosclerotic heart disease of native coronary artery without angina pectoris: Secondary | ICD-10-CM | POA: Diagnosis present

## 2021-11-23 DIAGNOSIS — Z7984 Long term (current) use of oral hypoglycemic drugs: Secondary | ICD-10-CM

## 2021-11-23 DIAGNOSIS — Z951 Presence of aortocoronary bypass graft: Secondary | ICD-10-CM

## 2021-11-23 DIAGNOSIS — I5022 Chronic systolic (congestive) heart failure: Secondary | ICD-10-CM | POA: Diagnosis present

## 2021-11-23 DIAGNOSIS — E1165 Type 2 diabetes mellitus with hyperglycemia: Secondary | ICD-10-CM | POA: Diagnosis present

## 2021-11-23 DIAGNOSIS — K219 Gastro-esophageal reflux disease without esophagitis: Secondary | ICD-10-CM | POA: Diagnosis present

## 2021-11-23 HISTORY — PX: ANGIOPLASTY: SHX39

## 2021-11-23 HISTORY — PX: ULTRASOUND GUIDANCE FOR VASCULAR ACCESS: SHX6516

## 2021-11-23 HISTORY — PX: ENDARTERECTOMY FEMORAL: SHX5804

## 2021-11-23 HISTORY — PX: FEMORAL-TIBIAL BYPASS GRAFT: SHX938

## 2021-11-23 HISTORY — PX: VEIN HARVEST: SHX6363

## 2021-11-23 LAB — POCT I-STAT 7, (LYTES, BLD GAS, ICA,H+H)
Acid-base deficit: 3 mmol/L — ABNORMAL HIGH (ref 0.0–2.0)
Bicarbonate: 25.4 mmol/L (ref 20.0–28.0)
Calcium, Ion: 1.27 mmol/L (ref 1.15–1.40)
HCT: 34 % — ABNORMAL LOW (ref 39.0–52.0)
Hemoglobin: 11.6 g/dL — ABNORMAL LOW (ref 13.0–17.0)
O2 Saturation: 97 %
Patient temperature: 36.4
Potassium: 5.1 mmol/L (ref 3.5–5.1)
Sodium: 137 mmol/L (ref 135–145)
TCO2: 27 mmol/L (ref 22–32)
pCO2 arterial: 60.9 mmHg — ABNORMAL HIGH (ref 32.0–48.0)
pH, Arterial: 7.225 — ABNORMAL LOW (ref 7.350–7.450)
pO2, Arterial: 110 mmHg — ABNORMAL HIGH (ref 83.0–108.0)

## 2021-11-23 LAB — POCT ACTIVATED CLOTTING TIME
Activated Clotting Time: 245 seconds
Activated Clotting Time: 293 seconds
Activated Clotting Time: 233 seconds

## 2021-11-23 LAB — CREATININE, SERUM
Creatinine, Ser: 1.18 mg/dL (ref 0.61–1.24)
GFR, Estimated: >60 mL/min (ref >60)

## 2021-11-23 LAB — CBC
HCT: 38.3 % — ABNORMAL LOW (ref 39.0–52.0)
HCT: 34.7 % — ABNORMAL LOW (ref 39.0–52.0)
Hemoglobin: 12.2 g/dL — ABNORMAL LOW (ref 13.0–17.0)
Hemoglobin: 11.4 g/dL — ABNORMAL LOW (ref 13.0–17.0)
MCH: 28.8 pg (ref 26.0–34.0)
MCH: 29.5 pg (ref 26.0–34.0)
MCHC: 31.9 g/dL (ref 30.0–36.0)
MCHC: 32.9 g/dL (ref 30.0–36.0)
MCV: 90.3 fL (ref 80.0–100.0)
MCV: 89.7 fL (ref 80.0–100.0)
Platelets: 305 10*3/uL (ref 150–400)
Platelets: 304 10*3/uL (ref 150–400)
RBC: 4.24 MIL/uL (ref 4.22–5.81)
RBC: 3.87 MIL/uL — ABNORMAL LOW (ref 4.22–5.81)
RDW: 14.2 % (ref 11.5–15.5)
RDW: 14.2 % (ref 11.5–15.5)
WBC: 11.3 10*3/uL — ABNORMAL HIGH (ref 4.0–10.5)
WBC: 20.6 10*3/uL — ABNORMAL HIGH (ref 4.0–10.5)
nRBC: 0 % (ref 0.0–0.2)
nRBC: 0 % (ref 0.0–0.2)

## 2021-11-23 LAB — TYPE AND SCREEN
ABO/RH(D): O NEG
Antibody Screen: NEGATIVE

## 2021-11-23 LAB — URINALYSIS, ROUTINE W REFLEX MICROSCOPIC
Bilirubin Urine: NEGATIVE
Glucose, UA: NEGATIVE mg/dL
Hgb urine dipstick: NEGATIVE
Ketones, ur: NEGATIVE mg/dL
Nitrite: NEGATIVE
Protein, ur: NEGATIVE mg/dL
Specific Gravity, Urine: 1.015 (ref 1.005–1.030)
pH: 5 (ref 5.0–8.0)

## 2021-11-23 LAB — COMPREHENSIVE METABOLIC PANEL
ALT: 18 U/L (ref 0–44)
AST: 13 U/L — ABNORMAL LOW (ref 15–41)
Albumin: 3.3 g/dL — ABNORMAL LOW (ref 3.5–5.0)
Alkaline Phosphatase: 69 U/L (ref 38–126)
Anion gap: 9 (ref 5–15)
BUN: 21 mg/dL (ref 8–23)
CO2: 22 mmol/L (ref 22–32)
Calcium: 8.8 mg/dL — ABNORMAL LOW (ref 8.9–10.3)
Chloride: 107 mmol/L (ref 98–111)
Creatinine, Ser: 1.15 mg/dL (ref 0.61–1.24)
GFR, Estimated: >60 mL/min (ref >60)
Glucose, Bld: 116 mg/dL — ABNORMAL HIGH (ref 70–99)
Potassium: 4.3 mmol/L (ref 3.5–5.1)
Sodium: 138 mmol/L (ref 135–145)
Total Bilirubin: 0.6 mg/dL (ref 0.3–1.2)
Total Protein: 7.3 g/dL (ref 6.5–8.1)

## 2021-11-23 LAB — PROTIME-INR
INR: 1.1 (ref 0.8–1.2)
Prothrombin Time: 14.4 seconds (ref 11.4–15.2)

## 2021-11-23 LAB — SARS CORONAVIRUS 2 BY RT PCR (HOSPITAL ORDER, PERFORMED IN ~~LOC~~ HOSPITAL LAB): SARS Coronavirus 2: NEGATIVE

## 2021-11-23 LAB — GLUCOSE, CAPILLARY
Glucose-Capillary: 126 mg/dL — ABNORMAL HIGH (ref 70–99)
Glucose-Capillary: 139 mg/dL — ABNORMAL HIGH (ref 70–99)
Glucose-Capillary: 184 mg/dL — ABNORMAL HIGH (ref 70–99)
Glucose-Capillary: 210 mg/dL — ABNORMAL HIGH (ref 70–99)

## 2021-11-23 LAB — SURGICAL PCR SCREEN
MRSA, PCR: NEGATIVE
Staphylococcus aureus: NEGATIVE

## 2021-11-23 LAB — APTT: aPTT: 29 seconds (ref 24–36)

## 2021-11-23 SURGERY — CREATION, BYPASS, ARTERIAL, FEMORAL TO TIBIAL, USING GRAFT
Anesthesia: General | Site: Leg Upper | Laterality: Right

## 2021-11-23 MED ORDER — ROCURONIUM BROMIDE 10 MG/ML (PF) SYRINGE
PREFILLED_SYRINGE | INTRAVENOUS | Status: DC | PRN
Start: 2021-11-23 — End: 2021-11-23
  Administered 2021-11-23: 50 mg via INTRAVENOUS
  Administered 2021-11-23: 70 mg via INTRAVENOUS
  Administered 2021-11-23 (×2): 20 mg via INTRAVENOUS
  Administered 2021-11-23: 30 mg via INTRAVENOUS

## 2021-11-23 MED ORDER — LABETALOL HCL 5 MG/ML IV SOLN: 10.0000 mg | INTRAVENOUS | Status: DC | PRN

## 2021-11-23 MED ORDER — HEPARIN SODIUM (PORCINE) 1000 UNIT/ML IJ SOLN
INTRAMUSCULAR | Status: AC
  Filled 2021-11-23: qty 10

## 2021-11-23 MED ORDER — FUROSEMIDE 40 MG PO TABS
40.0000 mg | ORAL_TABLET | Freq: Every day | ORAL | Status: DC
  Administered 2021-11-23 – 2021-11-26 (×4): 40 mg via ORAL
  Filled 2021-11-23 (×4): qty 1

## 2021-11-23 MED ORDER — CEFAZOLIN SODIUM-DEXTROSE 2-4 GM/100ML-% IV SOLN
2.0000 g | INTRAVENOUS | Status: AC
  Administered 2021-11-23 (×2): 2 g via INTRAVENOUS
  Filled 2021-11-23: qty 100

## 2021-11-23 MED ORDER — HYDRALAZINE HCL 20 MG/ML IJ SOLN: 5.0000 mg | INTRAMUSCULAR | Status: DC | PRN

## 2021-11-23 MED ORDER — PHENYLEPHRINE 40 MCG/ML (10ML) SYRINGE FOR IV PUSH (FOR BLOOD PRESSURE SUPPORT)
PREFILLED_SYRINGE | INTRAVENOUS | Status: DC | PRN
Start: 2021-11-23 — End: 2021-11-23
  Administered 2021-11-23 (×4): 80 ug via INTRAVENOUS
  Administered 2021-11-23: 160 ug via INTRAVENOUS

## 2021-11-23 MED ORDER — LOSARTAN POTASSIUM 25 MG PO TABS
12.5000 mg | ORAL_TABLET | Freq: Every day | ORAL | Status: DC
  Administered 2021-11-23: 12.5 mg via ORAL
  Filled 2021-11-23: qty 1

## 2021-11-23 MED ORDER — LIDOCAINE 2% (20 MG/ML) 5 ML SYRINGE
INTRAMUSCULAR | Status: AC
  Filled 2021-11-23: qty 5

## 2021-11-23 MED ORDER — DOCUSATE SODIUM 100 MG PO CAPS
100.0000 mg | ORAL_CAPSULE | Freq: Every day | ORAL | Status: DC
  Administered 2021-11-24 – 2021-11-26 (×3): 100 mg via ORAL
  Filled 2021-11-23 (×3): qty 1

## 2021-11-23 MED ORDER — GUAIFENESIN-DM 100-10 MG/5ML PO SYRP: 15.0000 mL | ORAL_SOLUTION | ORAL | Status: DC | PRN

## 2021-11-23 MED ORDER — ONDANSETRON HCL 4 MG/2ML IJ SOLN
INTRAMUSCULAR | Status: AC
  Filled 2021-11-23: qty 2

## 2021-11-23 MED ORDER — DULAGLUTIDE 0.75 MG/0.5ML ~~LOC~~ SOAJ: 0.7500 mg | SUBCUTANEOUS | Status: DC

## 2021-11-23 MED ORDER — ALUM & MAG HYDROXIDE-SIMETH 200-200-20 MG/5ML PO SUSP: 15.0000 mL | ORAL | Status: DC | PRN

## 2021-11-23 MED ORDER — SODIUM CHLORIDE 0.9 % IV SOLN: INTRAVENOUS | Status: DC

## 2021-11-23 MED ORDER — MORPHINE SULFATE (PF) 2 MG/ML IV SOLN
2.0000 mg | Freq: Once | INTRAVENOUS | Status: AC
  Administered 2021-11-23: 2 mg via INTRAVENOUS
  Filled 2021-11-23: qty 1

## 2021-11-23 MED ORDER — CHLORHEXIDINE GLUCONATE 0.12 % MT SOLN
OROMUCOSAL | Status: AC
  Administered 2021-11-23: 15 mL
  Filled 2021-11-23: qty 15

## 2021-11-23 MED ORDER — CEFAZOLIN SODIUM-DEXTROSE 2-4 GM/100ML-% IV SOLN
2.0000 g | Freq: Three times a day (TID) | INTRAVENOUS | Status: AC
  Administered 2021-11-23 (×2): 2 g via INTRAVENOUS
  Filled 2021-11-23 (×2): qty 100

## 2021-11-23 MED ORDER — HEPARIN 6000 UNIT IRRIGATION SOLUTION
Status: DC | PRN
  Administered 2021-11-23: 1

## 2021-11-23 MED ORDER — ACETAMINOPHEN 325 MG PO TABS: 325.0000 mg | ORAL_TABLET | ORAL | Status: DC | PRN

## 2021-11-23 MED ORDER — ALBUMIN HUMAN 5 % IV SOLN
INTRAVENOUS | Status: DC | PRN
Start: 2021-11-23 — End: 2021-11-23

## 2021-11-23 MED ORDER — FENTANYL CITRATE (PF) 100 MCG/2ML IJ SOLN: 25.0000 ug | INTRAMUSCULAR | Status: DC | PRN

## 2021-11-23 MED ORDER — HEPARIN SODIUM (PORCINE) 1000 UNIT/ML IJ SOLN
INTRAMUSCULAR | Status: DC | PRN
  Administered 2021-11-23 (×2): 2000 [IU] via INTRAVENOUS
  Administered 2021-11-23: 10000 [IU] via INTRAVENOUS

## 2021-11-23 MED ORDER — PROMETHAZINE HCL 25 MG/ML IJ SOLN: 6.2500 mg | INTRAMUSCULAR | Status: DC | PRN

## 2021-11-23 MED ORDER — ACETAMINOPHEN 500 MG PO TABS
1000.0000 mg | ORAL_TABLET | Freq: Once | ORAL | Status: AC
  Administered 2021-11-23: 1000 mg via ORAL
  Filled 2021-11-23: qty 2

## 2021-11-23 MED ORDER — METFORMIN HCL 500 MG PO TABS
500.0000 mg | ORAL_TABLET | Freq: Two times a day (BID) | ORAL | Status: DC
  Administered 2021-11-23 – 2021-11-26 (×6): 500 mg via ORAL
  Filled 2021-11-23 (×6): qty 1

## 2021-11-23 MED ORDER — MORPHINE SULFATE (PF) 2 MG/ML IV SOLN
2.0000 mg | INTRAVENOUS | Status: DC | PRN
  Administered 2021-11-23: 2 mg via INTRAVENOUS
  Filled 2021-11-23: qty 1

## 2021-11-23 MED ORDER — BISACODYL 10 MG RE SUPP: 10.0000 mg | Freq: Every day | RECTAL | Status: DC | PRN

## 2021-11-23 MED ORDER — PHENOL 1.4 % MT LIQD: 1.0000 | OROMUCOSAL | Status: DC | PRN

## 2021-11-23 MED ORDER — ONDANSETRON HCL 4 MG/2ML IJ SOLN
INTRAMUSCULAR | Status: DC | PRN
  Administered 2021-11-23: 4 mg via INTRAVENOUS

## 2021-11-23 MED ORDER — POTASSIUM CHLORIDE CRYS ER 20 MEQ PO TBCR
20.0000 meq | EXTENDED_RELEASE_TABLET | Freq: Every day | ORAL | Status: DC
  Administered 2021-11-23 – 2021-11-26 (×4): 20 meq via ORAL
  Filled 2021-11-23 (×4): qty 1

## 2021-11-23 MED ORDER — CHLORHEXIDINE GLUCONATE CLOTH 2 % EX PADS: 6.0000 | MEDICATED_PAD | Freq: Once | CUTANEOUS | Status: DC

## 2021-11-23 MED ORDER — LACTATED RINGERS IV SOLN: INTRAVENOUS | Status: DC

## 2021-11-23 MED ORDER — FENTANYL CITRATE (PF) 250 MCG/5ML IJ SOLN
INTRAMUSCULAR | Status: AC
  Filled 2021-11-23: qty 5

## 2021-11-23 MED ORDER — LIDOCAINE 2% (20 MG/ML) 5 ML SYRINGE
INTRAMUSCULAR | Status: DC | PRN
  Administered 2021-11-23: 60 mg via INTRAVENOUS

## 2021-11-23 MED ORDER — PROPOFOL 10 MG/ML IV BOLUS
INTRAVENOUS | Status: AC
  Filled 2021-11-23: qty 20

## 2021-11-23 MED ORDER — METOPROLOL TARTRATE 5 MG/5ML IV SOLN: 2.0000 mg | INTRAVENOUS | Status: DC | PRN

## 2021-11-23 MED ORDER — OXYCODONE-ACETAMINOPHEN 5-325 MG PO TABS
1.0000 | ORAL_TABLET | ORAL | Status: DC | PRN
  Administered 2021-11-23 – 2021-11-26 (×11): 2 via ORAL
  Filled 2021-11-23 (×11): qty 2

## 2021-11-23 MED ORDER — HEPARIN SODIUM (PORCINE) 5000 UNIT/ML IJ SOLN
5000.0000 [IU] | Freq: Three times a day (TID) | INTRAMUSCULAR | Status: DC
  Administered 2021-11-24 – 2021-11-26 (×7): 5000 [IU] via SUBCUTANEOUS
  Filled 2021-11-23 (×7): qty 1

## 2021-11-23 MED ORDER — ADULT MULTIVITAMIN W/MINERALS CH
1.0000 | ORAL_TABLET | Freq: Every day | ORAL | Status: DC
  Administered 2021-11-23 – 2021-11-26 (×4): 1 via ORAL
  Filled 2021-11-23 (×4): qty 1

## 2021-11-23 MED ORDER — PHENYLEPHRINE HCL-NACL 20-0.9 MG/250ML-% IV SOLN
INTRAVENOUS | Status: DC | PRN
  Administered 2021-11-23: 30 ug/min via INTRAVENOUS

## 2021-11-23 MED ORDER — ROCURONIUM BROMIDE 10 MG/ML (PF) SYRINGE
PREFILLED_SYRINGE | INTRAVENOUS | Status: AC
  Filled 2021-11-23: qty 10

## 2021-11-23 MED ORDER — MIDAZOLAM HCL 2 MG/2ML IJ SOLN
INTRAMUSCULAR | Status: AC
  Filled 2021-11-23: qty 2

## 2021-11-23 MED ORDER — HEPARIN 6000 UNIT IRRIGATION SOLUTION
Status: AC
  Filled 2021-11-23: qty 500

## 2021-11-23 MED ORDER — DEXAMETHASONE SODIUM PHOSPHATE 10 MG/ML IJ SOLN
INTRAMUSCULAR | Status: AC
  Filled 2021-11-23: qty 1

## 2021-11-23 MED ORDER — EPHEDRINE SULFATE-NACL 50-0.9 MG/10ML-% IV SOSY
PREFILLED_SYRINGE | INTRAVENOUS | Status: DC | PRN
Start: 2021-11-23 — End: 2021-11-23
  Administered 2021-11-23 (×2): 5 mg via INTRAVENOUS

## 2021-11-23 MED ORDER — ONDANSETRON HCL 4 MG/2ML IJ SOLN: 4.0000 mg | Freq: Four times a day (QID) | INTRAMUSCULAR | Status: DC | PRN

## 2021-11-23 MED ORDER — FENTANYL CITRATE (PF) 250 MCG/5ML IJ SOLN
INTRAMUSCULAR | Status: DC | PRN
  Administered 2021-11-23 (×7): 50 ug via INTRAVENOUS

## 2021-11-23 MED ORDER — CHLORHEXIDINE GLUCONATE 0.12 % MT SOLN: 15.0000 mL | Freq: Once | OROMUCOSAL | Status: DC

## 2021-11-23 MED ORDER — SUGAMMADEX SODIUM 200 MG/2ML IV SOLN
INTRAVENOUS | Status: DC | PRN
  Administered 2021-11-23: 200 mg via INTRAVENOUS

## 2021-11-23 MED ORDER — PROTAMINE SULFATE 10 MG/ML IV SOLN
INTRAVENOUS | Status: DC | PRN
  Administered 2021-11-23: 50 mg via INTRAVENOUS

## 2021-11-23 MED ORDER — LACTATED RINGERS IV SOLN: INTRAVENOUS | Status: DC | PRN

## 2021-11-23 MED ORDER — 0.9 % SODIUM CHLORIDE (POUR BTL) OPTIME
TOPICAL | Status: DC | PRN
  Administered 2021-11-23: 2000 mL

## 2021-11-23 MED ORDER — MEPERIDINE HCL 25 MG/ML IJ SOLN: 6.2500 mg | INTRAMUSCULAR | Status: DC | PRN

## 2021-11-23 MED ORDER — PANTOPRAZOLE SODIUM 40 MG PO TBEC
40.0000 mg | DELAYED_RELEASE_TABLET | Freq: Every day | ORAL | Status: DC
  Administered 2021-11-23 – 2021-11-26 (×4): 40 mg via ORAL
  Filled 2021-11-23 (×4): qty 1

## 2021-11-23 MED ORDER — MAGNESIUM SULFATE 2 GM/50ML IV SOLN: 2.0000 g | Freq: Every day | INTRAVENOUS | Status: DC | PRN

## 2021-11-23 MED ORDER — ATORVASTATIN CALCIUM 80 MG PO TABS
80.0000 mg | ORAL_TABLET | Freq: Every day | ORAL | Status: DC
  Administered 2021-11-23 – 2021-11-25 (×3): 80 mg via ORAL
  Filled 2021-11-23 (×3): qty 1

## 2021-11-23 MED ORDER — CARVEDILOL 6.25 MG PO TABS
6.2500 mg | ORAL_TABLET | Freq: Two times a day (BID) | ORAL | Status: DC
  Administered 2021-11-23 – 2021-11-26 (×6): 6.25 mg via ORAL
  Filled 2021-11-23 (×6): qty 1

## 2021-11-23 MED ORDER — DEXAMETHASONE SODIUM PHOSPHATE 10 MG/ML IJ SOLN
INTRAMUSCULAR | Status: DC | PRN
  Administered 2021-11-23: 5 mg via INTRAVENOUS

## 2021-11-23 MED ORDER — MIDAZOLAM HCL 2 MG/2ML IJ SOLN
INTRAMUSCULAR | Status: DC | PRN
  Administered 2021-11-23: 2 mg via INTRAVENOUS

## 2021-11-23 MED ORDER — INSULIN ASPART 100 UNIT/ML IJ SOLN
0.0000 [IU] | Freq: Three times a day (TID) | INTRAMUSCULAR | Status: DC
  Administered 2021-11-23: 3 [IU] via SUBCUTANEOUS
  Administered 2021-11-24 (×2): 5 [IU] via SUBCUTANEOUS
  Administered 2021-11-24: 17:00:00 3 [IU] via SUBCUTANEOUS
  Administered 2021-11-25 – 2021-11-26 (×2): 2 [IU] via SUBCUTANEOUS

## 2021-11-23 MED ORDER — SODIUM CHLORIDE 0.9 % IV SOLN: 500.0000 mL | Freq: Once | INTRAVENOUS | Status: DC | PRN

## 2021-11-23 MED ORDER — ACETAMINOPHEN 650 MG RE SUPP: 325.0000 mg | RECTAL | Status: DC | PRN

## 2021-11-23 MED ORDER — PROPOFOL 10 MG/ML IV BOLUS
INTRAVENOUS | Status: DC | PRN
  Administered 2021-11-23: 140 mg via INTRAVENOUS

## 2021-11-23 MED ORDER — ASPIRIN EC 81 MG PO TBEC
81.0000 mg | DELAYED_RELEASE_TABLET | Freq: Every day | ORAL | Status: DC
  Administered 2021-11-23 – 2021-11-26 (×4): 81 mg via ORAL
  Filled 2021-11-23 (×4): qty 1

## 2021-11-23 MED ORDER — POTASSIUM CHLORIDE CRYS ER 20 MEQ PO TBCR: 20.0000 meq | EXTENDED_RELEASE_TABLET | Freq: Every day | ORAL | Status: DC | PRN

## 2021-11-23 MED ORDER — HEPARIN SODIUM (PORCINE) 1000 UNIT/ML IJ SOLN
INTRAMUSCULAR | Status: AC
  Filled 2021-11-23: qty 1

## 2021-11-23 MED ORDER — POLYETHYLENE GLYCOL 3350 17 G PO PACK: 17.0000 g | PACK | Freq: Every day | ORAL | Status: DC | PRN

## 2021-11-23 MED ORDER — ORAL CARE MOUTH RINSE: 15.0000 mL | Freq: Once | OROMUCOSAL | Status: DC

## 2021-11-23 MED FILL — Heparin Sod (Porcine)-NaCl IV Soln 1000 Unit/500ML-0.9%: INTRAVENOUS | Qty: 1000 | Status: AC

## 2021-11-23 SURGICAL SUPPLY — 50 items
ADH SKN CLS APL DERMABOND .7 (GAUZE/BANDAGES/DRESSINGS) ×9
BAG COUNTER SPONGE SURGICOUNT (BAG) ×4 IMPLANT
BAG SPNG CNTER NS LX DISP (BAG) ×3
BANDAGE ESMARK 6X9 LF (GAUZE/BANDAGES/DRESSINGS) IMPLANT
BLADE CLIPPER SURG (BLADE) ×4 IMPLANT
BNDG CMPR 9X6 STRL LF SNTH (GAUZE/BANDAGES/DRESSINGS)
BNDG ESMARK 6X9 LF (GAUZE/BANDAGES/DRESSINGS)
CANISTER SUCT 3000ML PPV (MISCELLANEOUS) ×4 IMPLANT
CANNULA VESSEL 3MM 2 BLNT TIP (CANNULA) ×1 IMPLANT
CLIP VESOCCLUDE MED 24/CT (CLIP) ×4 IMPLANT
CLIP VESOCCLUDE SM WIDE 24/CT (CLIP) ×4 IMPLANT
COVER PROBE W GEL 5X96 (DRAPES) ×4 IMPLANT
DERMABOND ADVANCED (GAUZE/BANDAGES/DRESSINGS) ×3
DERMABOND ADVANCED .7 DNX12 (GAUZE/BANDAGES/DRESSINGS) IMPLANT
DRAPE HALF SHEET 40X57 (DRAPES) ×1 IMPLANT
ELECT REM PT RETURN 9FT ADLT (ELECTROSURGICAL) ×4
ELECTRODE REM PT RTRN 9FT ADLT (ELECTROSURGICAL) ×3 IMPLANT
GLOVE SRG 8 PF TXTR STRL LF DI (GLOVE) ×3 IMPLANT
GLOVE SURG ENC MOIS LTX SZ7.5 (GLOVE) ×4 IMPLANT
GLOVE SURG UNDER POLY LF SZ8 (GLOVE) ×4
GOWN STRL REUS W/ TWL LRG LVL3 (GOWN DISPOSABLE) ×6 IMPLANT
GOWN STRL REUS W/ TWL XL LVL3 (GOWN DISPOSABLE) ×6 IMPLANT
GOWN STRL REUS W/TWL LRG LVL3 (GOWN DISPOSABLE) ×8
GOWN STRL REUS W/TWL XL LVL3 (GOWN DISPOSABLE) ×8
GRAFT PROPATEN W/RING 6X80X60 (Vascular Products) ×1 IMPLANT
KIT BASIN OR (CUSTOM PROCEDURE TRAY) ×4 IMPLANT
KIT TURNOVER KIT B (KITS) ×4 IMPLANT
LOOP VESSEL MINI RED (MISCELLANEOUS) ×1 IMPLANT
MARKER SKIN DUAL TIP RULER LAB (MISCELLANEOUS) ×1 IMPLANT
NS IRRIG 1000ML POUR BTL (IV SOLUTION) ×8 IMPLANT
PACK PERIPHERAL VASCULAR (CUSTOM PROCEDURE TRAY) ×4 IMPLANT
PAD ARMBOARD 7.5X6 YLW CONV (MISCELLANEOUS) ×8 IMPLANT
PATCH VASC XENOSURE 1CMX6CM (Vascular Products) ×8 IMPLANT
PATCH VASC XENOSURE 1X6 (Vascular Products) IMPLANT
STOPCOCK 4 WAY LG BORE MALE ST (IV SETS) ×4 IMPLANT
SUT MNCRL AB 4-0 PS2 18 (SUTURE) ×9 IMPLANT
SUT PROLENE 5 0 C 1 24 (SUTURE) ×12 IMPLANT
SUT PROLENE 6 0 BV (SUTURE) ×9 IMPLANT
SUT PROLENE 7 0 BV 1 (SUTURE) ×1 IMPLANT
SUT SILK 2 0 PERMA HAND 18 BK (SUTURE) ×1 IMPLANT
SUT SILK 2 0 SH (SUTURE) ×4 IMPLANT
SUT VIC AB 2-0 CT1 27 (SUTURE) ×16
SUT VIC AB 2-0 CT1 TAPERPNT 27 (SUTURE) ×6 IMPLANT
SUT VIC AB 3-0 SH 27 (SUTURE) ×16
SUT VIC AB 3-0 SH 27X BRD (SUTURE) ×6 IMPLANT
TOWEL GREEN STERILE (TOWEL DISPOSABLE) ×4 IMPLANT
TRAY FOLEY MTR SLVR 16FR STAT (SET/KITS/TRAYS/PACK) ×4 IMPLANT
TUBING EXTENTION W/L.L. (IV SETS) ×3 IMPLANT
UNDERPAD 30X36 HEAVY ABSORB (UNDERPADS AND DIAPERS) ×4 IMPLANT
WATER STERILE IRR 1000ML POUR (IV SOLUTION) ×4 IMPLANT

## 2021-11-23 NOTE — Anesthesia Procedure Notes (Signed)
Procedure Name: Intubation Date/Time: 11/23/2021 7:50 AM Performed by: Lance Coon, CRNA Pre-anesthesia Checklist: Patient identified, Emergency Drugs available, Suction available and Patient being monitored Patient Re-evaluated:Patient Re-evaluated prior to induction Oxygen Delivery Method: Circle System Utilized Preoxygenation: Pre-oxygenation with 100% oxygen Induction Type: IV induction Ventilation: Mask ventilation without difficulty and Oral airway inserted - appropriate to patient size Laryngoscope Size: Sabra Heck and 2 Grade View: Grade I Tube type: Oral Tube size: 7.5 mm Number of attempts: 1 Airway Equipment and Method: Stylet and Oral airway Placement Confirmation: ETT inserted through vocal cords under direct vision, positive ETCO2 and breath sounds checked- equal and bilateral Secured at: 21 cm Tube secured with: Tape Dental Injury: Teeth and Oropharynx as per pre-operative assessment

## 2021-11-23 NOTE — H&P (Signed)
History and Physical Interval Note:  11/23/2021 7:34 AM  Robert Sanchez  has presented today for surgery, with the diagnosis of Critical limb ischemia of left lower extremity.  The various methods of treatment have been discussed with the patient and family. After consideration of risks, benefits and other options for treatment, the patient has consented to  Procedure(s) with comments: REDO LEFT FEMORAL-TIBIAL ARTERY BYPASS GRAFT (Left) - Insert arterial line as a surgical intervention.  The patient's history has been reviewed, patient examined, no change in status, stable for surgery.  I have reviewed the patient's chart and labs.  Questions were answered to the patient's satisfaction.    Re-do left femoral to tibial bypass.  CLI with rest pain.  No good vein conduit.  Previous left left GSV harvested for old bypass and right leg GSV for CABG.  Short segment of arm vein.  Risk and benefits discussed.  [name]  Patient name: Robert Sanchez     MRN: [id number]        DOB: 14-Dec-1955          Sex: male   REASON FOR VISIT: 1 month follow-up for occluded left leg bypass   HPI: Robert Sanchez is a 66 y.o. male with history of diabetes, coronary artery disease, peripheral vascular disease, tobacco abuse that presents for 1 month follow-up of occluded left leg bypass.  His most recent left common femoral to below-knee pop/TP trunk bypass with PTFE occluded in November last year.  He was not seen until he had symptom onset for about 4 to 6 weeks and was not a candidate for thrombolysis.  He was seen in the [facility] clinic at last visit in December and presents for follow-up to discuss with me.  States his left foot now is 10 out of 10 pain in the toes all the time.   He has a somewhat complex history in the left leg.  He initially underwent left common femoral to PT bypass with reversed saphenous vein in 2019 for CLI with rest pain.  Ultimately he presented to the hospital in March 2021 with an  occluded bypass and underwent thrombolysis and subsequent percutaneous mechanical thrombectomy with angioplasty of the bypass and PT on 01/16/20- 01/17/2020.  Ultimately his bypass was salvaged and he was discharged on Eliquis.  During this event he did develop necrotic left great.  He then presented again and his bypass had occluded a second time in September 2021 and underwent second attempt thrombolysis that was unsuccessful.  Ultimately during an attempted thrombolysis we took him back he developed thrombus in the common femoral and iliac and required thrombectomy and ultimately we performed a left common femoral to PT composite bypass using the distal vein on the PT since this appeared to be healthy.  This then thrombosed immediately after hospital discharge.  Then on 08/04/2020 he underwent thrombectomy of the left common femoral to PT composite bypass.  We performed a left retrograde left external iliac angioplasty of an external iliac artery stent.  We then put a bovine patch on the below-knee popliteal artery TP trunk and revised the bypass to the below-knee popliteal artery TP trunk with PTFE.Marland Kitchen  He also got a partial left toe amp at this time.  He then presented with an occluded bypass and underwent thrombolysis on 01/07/2021 with angioplasty of the distal anastomosis onto the BK pop TP trunk on 01/08/2021.         Past Medical History:  Diagnosis Date  Acute pulmonary edema (HCC) 06/05/2018   Arthritis     COPD (chronic obstructive pulmonary disease) (HCC)      " beginning stages " per pt   Coronary artery disease     Diabetes mellitus without complication ([place])      type 2   Diabetic neuropathy (HCC)     Diabetic neuropathy ([place])     Emphysema/COPD ([place])      " beginning stages"   GERD (gastroesophageal reflux disease)     HLD (hyperlipidemia)     HOH (hard of hearing)      no hearing aids   Myocardial infarction [facility])     Peripheral vascular disease ([place])             Past Surgical  History:  Procedure Laterality Date   ABDOMINAL AORTOGRAM W/LOWER EXTREMITY N/A 06/21/2018    Procedure: ABDOMINAL AORTOGRAM W/LOWER EXTREMITY;  Surgeon: [name], MD;  Location: [place] [facility];  Service: Cardiovascular;  Laterality: N/A;   ABDOMINAL AORTOGRAM W/LOWER EXTREMITY Left 01/16/2020    Procedure: ABDOMINAL AORTOGRAM W/LOWER EXTREMITY;  Surgeon: [name], MD;  Location: [place] [facility];  Service: Cardiovascular;  Laterality: Left;   ABDOMINAL AORTOGRAM W/LOWER EXTREMITY N/A 07/09/2020    Procedure: ABDOMINAL AORTOGRAM W/LOWER EXTREMITY;  Surgeon: [name], MD;  Location: [place] [facility];  Service: Cardiovascular;  Laterality: N/A;  TPA infusion   AMPUTATION Left 08/04/2020    Procedure: LEFT PARTIAL GREAT TOE AMPUTATION;  Surgeon: [name], MD;  Location: [place];  Service: Vascular;  Laterality: Left;   APPLICATION OF WOUND VAC Left 08/04/2020    Procedure: LEFT GROIN DEBRIDEMENT WITH APPLICATION OF WOUND [place];  Surgeon: [name], MD;  Location: [place];  Service: Vascular;  Laterality: Left;   CORONARY ARTERY BYPASS GRAFT N/A 06/12/2018    Procedure: CORONARY ARTERY BYPASS GRAFTING (CABG) x 3 WITH ENDOSCOPIC HARVESTING OF RIGHT GREATER SAPHENOUS VEIN. LIMA TO LAD. SVG TO PD. SVG TO DIAGONAL.;  Surgeon: Ivin Poot, MD;  Location: [place];  Service: Open Heart Surgery;  Laterality: N/A;   FEMORAL-POPLITEAL BYPASS GRAFT Left 07/10/2020    Procedure: REDO EXPOSURE OF LEFT COMMON FEMORAL ARTERY LEFT COMMON FEMORAL TO POSTERIOR TIBIAL COMPOSITE BYPASS GRAFT;  Surgeon: [name], MD;  Location: [place];  Service: Vascular;  Laterality: Left;   FEMORAL-TIBIAL BYPASS GRAFT Left 08/21/2018    Procedure: BYPASS GRAFT LEFT FEMORAL TO POSTERIOR TIBIAL ARTERY USING LEFT REVERSED GREAT SAPHENOUS VEIN;  Surgeon: [name], MD;  Location: [place];  Service: Vascular;  Laterality: Left;   FEMORAL-TIBIAL BYPASS GRAFT Left  08/04/2020    Procedure: REVISION FEMORAL-DISTAL BYPASS WITH BIOLOGICAL BOVINE PATCH;  Surgeon: [name], MD;  Location: [place];  Service: Vascular;  Laterality: Left;   KNEE ARTHROSCOPY       LOWER EXTREMITY ANGIOGRAM Left 08/04/2020    Procedure: LEFT ILIAC ANGIOGRAM, ANGIOPLASTY OF LEFT ILIAC STENT WITH DRUG COATED BALLOON;  Surgeon: [name], MD;  Location: [place];  Service: Vascular;  Laterality: Left;   LOWER EXTREMITY ANGIOGRAPHY Left 01/17/2020    Procedure: Lower Extremity Angiography;  Surgeon: [name], MD;  Location: [place] [facility];  Service: Cardiovascular;  Laterality: Left;   LOWER EXTREMITY ANGIOGRAPHY N/A 01/07/2021    Procedure: LOWER EXTREMITY ANGIOGRAPHY;  Surgeon: Cherre Robins, MD;  Location: [place] [facility];  Service: Cardiovascular;  Laterality: N/A;   PERIPHERAL VASCULAR BALLOON ANGIOPLASTY Left 01/17/2020  Procedure: PERIPHERAL VASCULAR BALLOON ANGIOPLASTY;  Surgeon: [name], MD;  Location: [place] [facility];  Service: Cardiovascular;  Laterality: Left;  pt   PERIPHERAL VASCULAR INTERVENTION Left 06/21/2018    Procedure: PERIPHERAL VASCULAR INTERVENTION;  Surgeon: [name], MD;  Location: [place] [facility];  Service: Cardiovascular;  Laterality: Left;   PERIPHERAL VASCULAR INTERVENTION Left 01/08/2021    Procedure: PERIPHERAL VASCULAR INTERVENTION;  Surgeon: [name], MD;  Location: [place] [facility];  Service: Cardiovascular;  Laterality: Left;   PERIPHERAL VASCULAR THROMBECTOMY Left 01/16/2020    Procedure: PERIPHERAL VASCULAR THROMBECTOMY;  Surgeon: [name], MD;  Location: [place] [facility];  Service: Cardiovascular;  Laterality: Left;  Lytic Catheter Placement left fem-pop bypass   PERIPHERAL VASCULAR THROMBECTOMY Left 01/17/2020    Procedure: PERIPHERAL VASCULAR THROMBECTOMY;  Surgeon: [name], MD;  Location: [place] [facility];  Service: Cardiovascular;  Laterality:  Left;  fem-pt bypass   PERIPHERAL VASCULAR THROMBECTOMY Left 07/10/2020    Procedure: lysis recheck;  Surgeon: [name], MD;  Location: [place] [facility];  Service: Cardiovascular;  Laterality: Left;   PULMONARY THROMBECTOMY N/A 01/08/2021    Procedure: LYSIS RECHECK;  Surgeon: [name], MD;  Location: [place] [facility];  Service: Cardiovascular;  Laterality: N/A;   RIGHT/LEFT HEART CATH AND CORONARY ANGIOGRAPHY N/A 06/05/2018    Procedure: RIGHT/LEFT HEART CATH AND CORONARY ANGIOGRAPHY;  Surgeon: Dixie Dials, MD;  Location: [place] [facility];  Service: Cardiovascular;  Laterality: N/A;   SPINE SURGERY       TEE WITHOUT CARDIOVERSION N/A 06/12/2018    Procedure: TRANSESOPHAGEAL ECHOCARDIOGRAM (TEE);  Surgeon: Prescott Gum, Collier Salina, MD;  Location: [place];  Service: Open Heart Surgery;  Laterality: N/A;   teeth extractions       THROMBECTOMY FEMORAL ARTERY Left 08/04/2020    Procedure: THROMBECTOMY OF LEFT FEMORAL TP COMPOSTITE BYPASS;  Surgeon: [name], MD;  Location: [place];  Service: Vascular;  Laterality: Left;   THROMBECTOMY ILIAC ARTERY Left 07/10/2020    Procedure: THROMBECTOMY OF LEFT EXTERNAL ILIAC AND LEFT COMMON FEMORAL AND LEFT POSTERIOR TIBIAL ARTERIES;  Surgeon: [name], MD;  Location: [place];  Service: Vascular;  Laterality: Left;   VEIN HARVEST Left 08/21/2018    Procedure: VEIN HARVEST LEFT GREAT SAPHENOUS VEIN;  Surgeon: [name], MD;  Location: [place];  Service: Vascular;  Laterality: Left;           Family History  Problem Relation Age of Onset   Diabetes Mother     Lung disease Mother     Cancer Father     Heart disease Father        SOCIAL HISTORY: Social History         Tobacco Use   Smoking status: Some Days      Packs/day: 1.00      Years: 50.00      Pack years: 50.00      Types: Cigarettes      Last attempt to quit: 06/02/2018      Years since quitting: 3.4   Smokeless tobacco: Never   Tobacco comments:       1PPD x 50 years. 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   Substance Use Topics   Alcohol use: Yes      Comment: occasional           Allergies  Allergen Reactions   Lisinopril Other (See Comments)  Syncope   Codeine Rash            Current Outpatient Medications  Medication Sig Dispense Refill   Accu-Chek Softclix Lancets lancets Use to test blood sugars up to 4 times daily as directed. 100 each 5   aspirin 81 MG EC tablet Take 1 tablet (81 mg total) by mouth daily. Swallow whole.       atorvastatin (LIPITOR) 80 MG tablet Take 1 tablet (80 mg total) by mouth at bedtime. 30 tablet 3   Blood Glucose Monitoring Suppl (BLOOD GLUCOSE MONITOR SYSTEM) w/Device KIT Use to test blood sugars up to 4 times daily as directed. 1 kit 0   carvedilol (COREG) 6.25 MG tablet Take 1 tablet (6.25 mg total) by mouth 2 (two) times daily with a meal. 60 tablet 5   Dulaglutide (TRULICITY) 7.35 HG/9.9ME SOPN Inject 0.75 mg into the skin once a week. 2 mL 3   furosemide (LASIX) 40 MG tablet Take 1 tablet (40 mg total) by mouth daily. 30 tablet 5   glucose blood test strip Use to test blood sugars up to 4 times daily as directed. 100 each 5   insulin isophane & regular human KwikPen (HUMULIN 70/30 MIX) (70-30) 100 UNIT/ML KwikPen Inject 30 units with breakfast and dinner. 18 mL 5   Insulin Pen Needle 32G X 4 MM MISC Use as directed with insulin pen 200 each 0   losartan (COZAAR) 25 MG tablet Take 0.5 tablets (12.5 mg total) by mouth at bedtime. 45 tablet 3   metFORMIN (GLUCOPHAGE) 500 MG tablet Take 1 tablet (500 mg total) by mouth 2 (two) times daily with a meal. 60 tablet 5   Multiple Vitamin (MULTIVITAMIN) tablet Take 1 tablet by mouth daily.       potassium chloride SA (KLOR-CON M) 20 MEQ tablet Take 1 tablet (20 mEq total) by mouth daily. 30 tablet 5   rivaroxaban (XARELTO) 20 MG TABS tablet Take 1 tablet (20 mg total) by mouth daily with supper. 30 tablet 5   spironolactone (ALDACTONE) 25 MG  tablet Take 0.5 tablets (12.5 mg total) by mouth daily. 45 tablet 3   doxycycline (VIBRA-TABS) 100 MG tablet Take 1 tablet (100 mg total) by mouth 2 (two) times daily. (Patient not taking: Reported on 11/17/2021) 14 tablet 0    No current facility-administered medications for this visit.      REVIEW OF SYSTEMS:  '[X]'  denotes positive finding, '[ ]'  denotes negative finding Cardiac   Comments:  Chest pain or chest pressure:      Shortness of breath upon exertion:      Short of breath when lying flat:      Irregular heart rhythm:             Vascular      Pain in calf, thigh, or hip brought on by ambulation:      Pain in feet at night that wakes you up from your sleep:       Blood clot in your veins:      Leg swelling:              Pulmonary      Oxygen at home:      Productive cough:       Wheezing:              Neurologic      Sudden weakness in arms or legs:       Sudden numbness in arms or legs:  Sudden onset of difficulty speaking or slurred speech:      Temporary loss of vision in one eye:       Problems with dizziness:              Gastrointestinal      Blood in stool:       Vomited blood:              Genitourinary      Burning when urinating:       Blood in urine:             Psychiatric      Major depression:              Hematologic      Bleeding problems:      Problems with blood clotting too easily:             Skin      Rashes or ulcers:             Constitutional      Fever or chills:          PHYSICAL EXAM:    Vitals:    11/17/21 0826  BP: 108/64  Pulse: 90  Resp: 18  Temp: 97.7 F (36.5 C)  TempSrc: Temporal  SpO2: 96%  Weight: 212 lb (96.2 kg)  Height: '5\' 9"'  (1.753 m)      GENERAL: The patient is a well-nourished male, in no acute distress. The vital signs are documented above. CARDIAC: There is a regular rate and rhythm.  VASCULAR:  Bilateral femoral pulses palpable No palpable right pedal pulses No palpable left pedal  pulses Dependent rubor left foot Left great toe amputation previously well healed           DATA:    Left leg arterial duplex 10/13/2021 shows occluded left common femoral to TP trunk bypass.  ABIs on the left 0.45 monophasic.   Assessment/Plan:   66 year old male with complex history that most recently underwent thrombectomy of his left common femoral to PT composite bypass with retrograde iliac angioplasty and then revision of the distal bypass onto the below-knee popliteal artery/TP trunk with a bovine pericardial patch angioplasty.  This then required thrombolysis on 01/07/2021 and 01/08/2021 with angioplasty of the distal anastomosis.  His bypass occluded sometime in early November 2022 and he was not seen in follow-up until about 4-6 weeks later and not a candidate for thrombolysis at that time.  Initially his symptoms were fairly tolerable and we elected observation.  On follow-up today he is now having 10 out of 10 rest pain in the left foot.  I discussed he really has limited options at this time and he should strongly consider amputation but he is not interested in amputation at this time and would like to try and go back to work given he has severe financial issues.  I discussed we could put him on the schedule for Friday for a angiogram with my partner Dr. Stanford Breed to look for distal bypass target.  Then consider redo left common femoral to likely tibial bypass and this will be with PTFE which is going to limit the durability and be high risk for infection.  All this was discussed with the patient.  He is amendable to proceed.  I will put him on for a redo left common femoral to tibial bypass with me on Monday after angiogram on Friday.  I have asked that he hold his Xarelto.  Risks and  benefits discussed.     [name], MD [facility] Office: [phone]

## 2021-11-23 NOTE — Op Note (Addendum)
Date: November 23, 2021  Preoperative diagnosis: Critical limb ischemia of the left lower extremity with rest pain   Postoperative diagnosis: Same  Procedure: 1.  Redo exposure of left common femoral artery greater than 30 days 2.  Harvest of right leg great saphenous vein in the proximal thigh 3.  Left common femoral endarterectomy with profundoplasty and bovine pericardial patch angioplasty including patch angioplasty of the profunda 4.  Left common femoral artery to anterior tibial artery bypass with composite 6 mm ringed PTFE and great saphenous vein (tunneled lateral subcutaneously)  Surgeon: Dr. Cephus Shelling, MD  Assistant: Doreatha Massed, PA  Indications: 66 year old male whose has had multiple failed previous left lower extremity interventions including a left common femoral to PT bypass with vein and then subsequent multiple thrombolysis and then revision with jump graft.  His most recent bypass is occluded and he only has an anterior tibial target into the foot.  He presents for planned redo left common femoral to anterior tibial bypass after risk benefits discussed.  An assistant was needed for exposure and to expedite the case.    Findings: Patient had no saphenous vein in the left leg and only a short segment of saphenous vein in the right leg given previous harvest for CABG.  Short segment of saphenous vein was harvested in the right proximal thigh.  The left common femoral was a redo exposure with significant scar tissue.  Left common femoral endarterectomy was then performed including profundoplasty and bovine pericardial patch was sewn.  The endarterectomy was very thin on the profunda and ultimately had to place a second small bovine pericardial patch on the profunda distal to the main patch and I ligated the native left SFA and detached this (SFA chronically occluded).  A 6 mm ringed PTFE graft was then tunneled from the left common femoral artery off the patch and  tunneled lateral in the subcutaneous tissue to the anterior tibial artery.  I spliced a short segment of great saphenous vein at the anterior tibial exposure and a composite graft was sewn to the anterior tibial artery.  Excellent Doppler signal at completion in the AT and DP.  Anesthesia: General  EBL: 450 mL  Details: Patient was taken to the operating room after informed consent was obtained.  Placed on the operative table supine position.  General endotracheal anesthesia was induced.  I did use ultrasound and evaluated the right leg where I found only a short segment of great saphenous vein in the proximal thigh that was marked.  The vein in the left leg had already been harvested for previous bypass.  Bilateral groins and bilateral legs were then prepped and draped in standard sterile fashion.  Timeout was performed.  Antibiotics were given.  Initially started in the left groin where a horizontal groin incision was made just above inguinal crease through his old scar.  Dissected down with Bovie cautery through the scar tissue and the femoral sheath was opened longitudinally.  I dissected out the previous bypass in the left groin that was occluded and also dissected out the common femoral, profunda, and SFA as well as the distal external iliac artery and all these were controlled with vessel loops.  I then went down to the mid left calf lateral 2 fingerbreadths off the tibia where I made a longitudinal incision.  Ultimately I opened the subcutaneous tissue and fascia with Bovie cautery.  I then dissected between the tibialis anterior and the flexor digitorum longus and used small wheatlander  retractors to dissect out the anterior tibial artery in the mid calf.  Vesseloops were then placed proximally distally and small clips on all the branches.  I then used a large Gore tunneler and tunneled in the subcutaneous tissue from the anterior tibial artery exposure on the lateral calf along the lateral condyle up  into the groin over the sartorius muscle.  I then passed a 6 mm ringed PTFE graft.  We then went to the right proximal thigh and made a short longitudinal incision dissected down and dissected out about a 6 to 8 cm segment of great saphenous vein and all side branches were ligated between 3-0 silk ties and divided and I also divided this over right angle clamp at the saphenofemoral junction.  This was passed off the field and flushed with a vessel cannula.  Patient was given 100 units/kg IV heparin.  ACT was checked to maintain greater than 250.  Then turned our attention to the left groin where Vesseloops were placed on the SFA profunda and a Henley clamp was placed on the distal external iliac.  I then divided the previous bypass graft that was occluded and ultimately the entire hood of the previous bypass was removed from the common femoral artery.  There was heavy plaque in the common femoral artery and I elected to perform femoral endarterectomy with a Runner, broadcasting/film/video and ultimately was able to get excellent inflow.  I carried this all the way down to the bifurcation of the SFA and profunda and attempted eversion endarterectomy of the profunda.  Ultimately became apparent that the endarterectomy was very thin down the proximal profunda and I elected to divide the SFA that was already occluded in order to better visualize the profunda for repair.  I continued to endarterectomized the profunda until I felt like we had a nice feathered endpoint.  A bovine pericardial patch was then brought on the field and sewn in place with 5-0 Prolene parachute technique and we de-aired the artery prior to completion.  The patch was placed onto the profunda.  Once we came off clamps we had another area in the profunda more distally where there was bleeding and I dissected this out and controlled this with a Henley clamp.  I initially tried to repair this primarily but it became apparent that the artery was thin and torn from  the endarterectomy and required a second bovine pericardial patch that was sewn to the profunda with 6-0 Prolene parachute technique on the anterior wall.  We then had good hemostasis and had excellent Doppler flow in the profunda.  I then controlled the left common femoral patch with a Cooley clamp and the proximal 6 mm PTFE graft was spatulated and sewn in place with a 6-0 Prolene parachute technique end to side to the patch.  We had excellent pulsatile flow distally down into the anterior tibial exposure where the graft was tunneled.  We then controlled the graft in the groin and I flushed the bypass.  I then cut the bypass to where it was just exposed proximally in the wound.  We brought our great saphenous vein on the field in reverse fashion and this was spatulated and sewn end to side to the PTFE graft.  I then used Vesseloops to control the anterior tibial and this was opened 11 blade scalpel extended with Potts scissors.  The artery looked pretty healthy.  I then spatulated the vein and an end-to-side anastomosis was sewn to the anterior tibial artery in  the wound bed.  We de-aired everything prior to completion.  There was excellent Doppler flow distal to the bypass and also in the anterior tibial at the ankle.  50 mg protamine was given for reversal.  All the wounds were irrigated out.  Hemostasis was obtained.  I did place an additional repair sutures in the common femoral patch in the left groin with 5-0 Prolene.  The saphenectomy incision in the right proximal thigh and the anterior tibial exposure in the left calf was then closed in 3-0 Vicryl and 4-0 Monocryl and Dermabond.  The left groin was irrigated out and closed in multiple layers of 2-0 Vicryl, 3-0 Vicryl, 4-0 Monocryl Dermabond.  Excellent Doppler signal completion.  Taken to recovery in stable condition.  Complication: None  Condition: Stable  Cephus Shellinghristopher J. Rylen Hou, MD [facility] Office:  [phone]   Cephus Shellinghristopher J Chase Arnall

## 2021-11-23 NOTE — Progress Notes (Signed)
Pt arrived from PACU, VSS, Chg complete, tele started, oriented to unit, orders released and checked, groins level 0.    Chrisandra Carota, RN 11/23/2021 2:03 PM

## 2021-11-23 NOTE — Progress Notes (Signed)
OT Cancellation Note  Patient Details Name: Robert Sanchez MRN: [id number] DOB: [birth year]   Cancelled Treatment:    Reason Eval/Treat Not Completed: Other (comment) (Sleeping soundly. RN reporting he has been pain today and just received morphine. Will hold and return as schedule allows.)  Kiaira Pointer M Tippi Mccrae Emyah Roznowski MSOT, OTR/L Acute Rehab Pager: [phone] Office: [phone] 11/23/2021, 4:41 PM

## 2021-11-23 NOTE — Discharge Instructions (Signed)
 [facility]  Discharge instructions  Lower Extremity Bypass Surgery  Please refer to the following instruction for your post-procedure care. Your surgeon or physician assistant will discuss any changes with you.  Activity  You are encouraged to walk as much as you can. You can slowly return to normal activities during the month after your surgery. Avoid strenuous activity and heavy lifting until your doctor tells you it's OK. Avoid activities such as vacuuming or swinging a golf club. Do not drive until your doctor give the OK and you are no longer taking prescription pain medications. It is also normal to have difficulty with sleep habits, eating and bowel movement after surgery. These will go away with time.  Bathing/Showering  Shower daily after you go home. Do not soak in a bathtub, hot tub, or swim until the incision heals completely.  Incision Care  Clean your incision with mild soap and water. Shower every day. Pat the area dry with a clean towel. You do not need a bandage unless otherwise instructed. Do not apply any ointments or creams to your incision. If you have open wounds you will be instructed how to care for them or a visiting nurse may be arranged for you. If you have staples or sutures along your incision they will be removed at your post-op appointment. You may have skin glue on your incision. Do not peel it off. It will come off on its own in about one week.  Wash the groin wound with soap and water daily and pat dry. (No tub bath-only shower)  Then put a dry gauze or washcloth in the groin to keep this area dry to help prevent wound infection.  Do this daily and as needed.  Do not use Vaseline or neosporin on your incisions.  Only use soap and water on your incisions and then protect and keep dry.  Diet  Resume your normal diet. There are no special food restrictions following this procedure. A low fat/ low cholesterol diet is  recommended for all patients with vascular disease. In order to heal from your surgery, it is CRITICAL to get adequate nutrition. Your body requires vitamins, minerals, and protein. Vegetables are the best source of vitamins and minerals. Vegetables also provide the perfect balance of protein. Processed food has little nutritional value, so try to avoid this.  Medications  Resume taking all your medications unless your doctor or physician assistant tells you not to. If your incision is causing pain, you may take over-the-counter pain relievers such as acetaminophen (Tylenol). If you were prescribed a stronger pain medication, please aware these medication can cause nausea and constipation. Prevent nausea by taking the medication with a snack or meal. Avoid constipation by drinking plenty of fluids and eating foods with high amount of fiber, such as fruits, vegetables, and grains. Take Colace 100 mg (an over-the-counter stool softener) twice a day as needed for constipation.  Do not take Tylenol if you are taking prescription pain medications.  Follow Up  Our office will schedule a follow up appointment 2-3 weeks following discharge.  Please call us immediately for any of the following conditions  Severe or worsening pain in your legs or feet while at rest or while walking Increase pain, redness, warmth, or drainage (pus) from your incision site(s) Fever of 101 degree or higher The swelling in your leg with the bypass suddenly worsens and becomes more painful than when you were in the hospital If you have   been instructed to feel your graft pulse then you should do so every day. If you can no longer feel this pulse, call the office immediately. Not all patients are given this instruction.  Leg swelling is common after leg bypass surgery.  The swelling should improve over a few months following surgery. To improve the swelling, you may elevate your legs above the level of your heart while you are  sitting or resting. Your surgeon or physician assistant may ask you to apply an ACE wrap or wear compression (TED) stockings to help to reduce swelling.  Reduce your risk of vascular disease  Stop smoking. If you would like help call [facility] at 1-800-QUIT-NOW (1-800-784-8669) or [facility] at [phone].  Manage your cholesterol Maintain a desired weight Control your diabetes weight Control your diabetes Keep your blood pressure down  If you have any questions, please call the office at [phone]  

## 2021-11-23 NOTE — Anesthesia Procedure Notes (Signed)
Arterial Line Insertion Performed by: Yaakov Guthrie, RN  Preanesthetic checklist: patient identified, IV checked, site marked, risks and benefits discussed, surgical consent, monitors and equipment checked, pre-op evaluation, timeout performed and anesthesia consent Lidocaine 1% used for infiltration Left, radial was placed Catheter size: 22 G Hand hygiene performed  and Seldinger technique used Allen's test indicative of satisfactory collateral circulation Attempts: 1 Procedure performed without using ultrasound guided technique. Following insertion, dressing applied and Biopatch. Patient tolerated the procedure well with no immediate complications.

## 2021-11-23 NOTE — Progress Notes (Signed)
°  Day of Surgery Note    Subjective:  resting comfortably in pacu   Vitals:   11/23/21 1238 11/23/21 1253  BP: (!) 150/74 139/64  Pulse: 88 87  Resp: (!) 22 11  Temp: (!) 97.2 F (36.2 C)   SpO2: 91% 90%    Incisions:   bilateral groin and LLE incisions look good without hematoma Extremities:  brisk left DP/peroneal doppler signals Cardiac:  regular Lungs:  non labored    Assessment/Plan:  This is a 66 y.o. male who is s/p  1.  Redo exposure of left common femoral artery greater than 30 days 2.  Harvest of right leg great saphenous vein in the proximal thigh 3.  Left common femoral endarterectomy with profundoplasty and bovine pericardial patch angioplasty including patch angioplasty of the profunda 4.  Left common femoral to anterior tibial artery bypass with composite 6 mm ringed PTFE and great saphenous vein (tunneled lateral subcutaneously)   -pt with brisk left DP/peroneal doppler signals.  Incisions look fine without hematoma -to 4 east later today -sq heparin to start tomorrow -pt with DM-held home dose of bid insulin since npo.  Will restart tomorrow.  SSI (moderate) ordered.   Leontine Locket, PA-C 11/23/2021 1:09 PM [phone]

## 2021-11-23 NOTE — Transfer of Care (Signed)
Immediate Anesthesia Transfer of Care Note  Patient: Robert Sanchez  Procedure(s) Performed: REDO LEFT FEMORAL-TIBIAL ARTERY BYPASS GRAFT USING PROPATEN 47mm VASCULAR GRAFT REMOVABLE RING (Left: Leg Upper) ULTRASOUND GUIDANCE FOR VASCULAR ACCESS (Bilateral: Groin) ENDARTERECTOMY FEMORAL (Left: Leg Upper) VEIN HARVEST (Right: Groin) ANGIOPLASTY with BOVINE PATCH 1cmx6cm (Left: Leg Upper)  Patient Location: PACU  Anesthesia Type:General  Level of Consciousness: drowsy and patient cooperative  Airway & Oxygen Therapy: Patient Spontanous Breathing  Post-op Assessment: Report given to RN and Post -op Vital signs reviewed and stable  Post vital signs: Reviewed and stable  Last Vitals:  Vitals Value Taken Time  BP 150/74 11/23/21 1237  Temp    Pulse 88 11/23/21 1238  Resp 28 11/23/21 1238  SpO2 91 % 11/23/21 1238  Vitals shown include unvalidated device data.  Last Pain:  Vitals:   11/23/21 0639  TempSrc:   PainSc: 10-Worst pain ever      Patients Stated Pain Goal: 2 (11/23/21 7793)  Complications: No notable events documented.

## 2021-11-23 NOTE — Anesthesia Postprocedure Evaluation (Signed)
Anesthesia Post Note  Patient: Robert Sanchez  Procedure(s) Performed: REDO LEFT FEMORAL-TIBIAL ARTERY BYPASS GRAFT USING PROPATEN 29mm VASCULAR GRAFT REMOVABLE RING (Left: Leg Upper) ULTRASOUND GUIDANCE FOR VASCULAR ACCESS (Bilateral: Groin) ENDARTERECTOMY FEMORAL (Left: Leg Upper) VEIN HARVEST (Right: Groin) ANGIOPLASTY with BOVINE PATCH 1cmx6cm (Left: Leg Upper)     Patient location during evaluation: PACU Anesthesia Type: General Level of consciousness: sedated and patient cooperative Pain management: pain level controlled Vital Signs Assessment: post-procedure vital signs reviewed and stable Respiratory status: spontaneous breathing Cardiovascular status: stable Anesthetic complications: no   No notable events documented.  Last Vitals:  Vitals:   11/23/21 1401 11/23/21 1600  BP: (!) 115/56 (!) 115/57  Pulse: 79 77  Resp: 19 15  Temp: 36.5 C 36.5 C  SpO2: 90% 90%    Last Pain:  Vitals:   11/23/21 1600  TempSrc: Oral  PainSc:                  Nolon Nations

## 2021-11-24 ENCOUNTER — Encounter (HOSPITAL_COMMUNITY): Payer: Self-pay | Admitting: Vascular Surgery

## 2021-11-24 LAB — BASIC METABOLIC PANEL
Anion gap: 12 (ref 5–15)
BUN: 21 mg/dL (ref 8–23)
CO2: 25 mmol/L (ref 22–32)
Calcium: 8.5 mg/dL — ABNORMAL LOW (ref 8.9–10.3)
Chloride: 101 mmol/L (ref 98–111)
Creatinine, Ser: 1.46 mg/dL — ABNORMAL HIGH (ref 0.61–1.24)
GFR, Estimated: 54 mL/min — ABNORMAL LOW (ref >60)
Glucose, Bld: 239 mg/dL — ABNORMAL HIGH (ref 70–99)
Potassium: 4.6 mmol/L (ref 3.5–5.1)
Sodium: 138 mmol/L (ref 135–145)

## 2021-11-24 LAB — GLUCOSE, CAPILLARY
Glucose-Capillary: 224 mg/dL — ABNORMAL HIGH (ref 70–99)
Glucose-Capillary: 229 mg/dL — ABNORMAL HIGH (ref 70–99)
Glucose-Capillary: 159 mg/dL — ABNORMAL HIGH (ref 70–99)
Glucose-Capillary: 159 mg/dL — ABNORMAL HIGH (ref 70–99)

## 2021-11-24 LAB — CBC
HCT: 31.5 % — ABNORMAL LOW (ref 39.0–52.0)
Hemoglobin: 10 g/dL — ABNORMAL LOW (ref 13.0–17.0)
MCH: 28.3 pg (ref 26.0–34.0)
MCHC: 31.7 g/dL (ref 30.0–36.0)
MCV: 89.2 fL (ref 80.0–100.0)
Platelets: 257 10*3/uL (ref 150–400)
RBC: 3.53 MIL/uL — ABNORMAL LOW (ref 4.22–5.81)
RDW: 14.1 % (ref 11.5–15.5)
WBC: 13.1 10*3/uL — ABNORMAL HIGH (ref 4.0–10.5)
nRBC: 0 % (ref 0.0–0.2)

## 2021-11-24 LAB — LIPID PANEL
Cholesterol: 88 mg/dL (ref 0–200)
HDL: 23 mg/dL — ABNORMAL LOW (ref >40)
LDL Cholesterol: 45 mg/dL (ref 0–99)
Total CHOL/HDL Ratio: 3.8 RATIO
Triglycerides: 99 mg/dL (ref <150)
VLDL: 20 mg/dL (ref 0–40)

## 2021-11-24 LAB — HEMOGLOBIN A1C
Hgb A1c MFr Bld: 8.1 % — ABNORMAL HIGH (ref 4.8–5.6)
Mean Plasma Glucose: 186 mg/dL

## 2021-11-24 MED ORDER — SODIUM CHLORIDE 0.9 % IV SOLN: INTRAVENOUS | Status: DC

## 2021-11-24 MED ORDER — INSULIN ASPART PROT & ASPART (70-30 MIX) 100 UNIT/ML ~~LOC~~ SUSP
15.0000 [IU] | Freq: Two times a day (BID) | SUBCUTANEOUS | Status: DC
  Administered 2021-11-24 – 2021-11-26 (×5): 15 [IU] via SUBCUTANEOUS
  Filled 2021-11-24: qty 10

## 2021-11-24 NOTE — Progress Notes (Signed)
PHARMACIST LIPID MONITORING   Robert Sanchez is a 66 y.o. male admitted on 11/23/2021 POD 1 s/p VVS procedure.  Pharmacy has been consulted to optimize lipid-lowering therapy with the indication of secondary prevention for clinical ASCVD.  Recent Labs:  Lipid Panel (last 6 months):   Lab Results  Component Value Date   CHOL 88 11/24/2021   TRIG 99 11/24/2021   HDL 23 (L) 11/24/2021   CHOLHDL 3.8 11/24/2021   VLDL 20 11/24/2021   LDLCALC 45 11/24/2021    Hepatic function panel (last 6 months):   Lab Results  Component Value Date   AST 13 (L) 11/23/2021   ALT 18 11/23/2021   ALKPHOS 69 11/23/2021   BILITOT 0.6 11/23/2021    SCr (since admission):   Serum creatinine: 1.46 mg/dL (H) 94/70/96 2836 Estimated creatinine clearance: 58 mL/min (A)  Current therapy and lipid therapy tolerance Current lipid-lowering therapy: Atorvastatin 80 mg daily Previous lipid-lowering therapies (if applicable):  Documented or reported allergies or intolerances to lipid-lowering therapies (if applicable): none  Assessment:   LDL low, no changes recommended at this time.  Plan:    1.Statin intensity (high intensity recommended for all patients regardless of the LDL):  No statin changes. The patient is already on a high intensity statin.  2.Add ezetimibe (if any one of the following):   Not indicated at this time.  3.Refer to lipid clinic:   No  4.Follow-up with:  Primary care provider - Hoy Register, MD  5.Follow-up labs after discharge:  No changes in lipid therapy, repeat a lipid panel in one year.       Reece Leader, Colon Flattery, BCCP Clinical Pharmacist  11/24/2021 11:40 AM   [facility] pharmacy phone numbers are listed on [email or web address]

## 2021-11-24 NOTE — Evaluation (Signed)
Physical Therapy Evaluation & Discharge Patient Details Name: Robert Sanchez MRN: [id number] DOB: 12-Aug-1956 Today's Date: 11/24/2021  History of Present Illness  Pt is a 66 y.o. male with LLE critical limb ischemia, admitted 11/23/21 for LLE endarterectomy, femoral to anterior tibial artery bypass. PMH includes tobacco use, COPD, DM, neuropathy, CAD, HLD, MI, PVD, arthritis, HOH.   Clinical Impression  Patient evaluated by Physical Therapy with no further acute PT needs identified. PTA, pt mod independent, lives alone, working, drives. Today, pt moving well with RW and intermittent min guard for balance; limited by expected LLE post-op pain and weakness. Pt familiar with post-op mobility expectations and precautions from multiple previous vascular interventions. All education has been completed and the patient has no further questions; pt to continue ambulating with mobility specialists. Acute PT is signing off. Thank you for this referral.     Recommendations for follow up therapy are one component of a multi-disciplinary discharge planning process, led by the attending physician.  Recommendations may be updated based on patient status, additional functional criteria and insurance authorization.  Follow Up Recommendations No PT follow up    Assistance Recommended at Discharge PRN  Patient can return home with the following  Assistance with cooking/housework;Assist for transportation    Equipment Recommendations None recommended by PT  Recommendations for Other Services       Functional Status Assessment       Precautions / Restrictions Precautions Precautions: Fall Restrictions Weight Bearing Restrictions: No      Mobility  Bed Mobility Overal bed mobility: Modified Independent             General bed mobility comments: HOB elevated    Transfers Overall transfer level: Needs assistance Equipment used: Rolling walker (2 wheels) Transfers: Sit to/from Stand Sit to  Stand: Min guard           General transfer comment: reliant on momentum to power into standing with decreased WB through LLE due to pain; min guard for balance    Ambulation/Gait Ambulation/Gait assistance: Min guard, Supervision Gait Distance (Feet): 200 Feet Assistive device: Rolling walker (2 wheels) Gait Pattern/deviations: Step-to pattern, Step-through pattern, Decreased stride length, Decreased weight shift to left, Trunk flexed, Antalgic Gait velocity: Decreased     General Gait Details: Slow, antalgic gait with RW and interimttent min guard for balance; pt hopping on RLE for first few steps, slowly increasing LLE WBAT with distance and encouragement, difficulty achieving heel-to-toe gait pattern due to pain  Stairs            Wheelchair Mobility    Modified Rankin (Stroke Patients Only)       Balance Overall balance assessment: Needs assistance   Sitting balance-Leahy Scale: Good Sitting balance - Comments: able to don bilateral socks sitting EOB   Standing balance support: No upper extremity supported, During functional activity, Bilateral upper extremity supported Standing balance-Leahy Scale: Fair Standing balance comment: can static stand wtihout UE support; reliant on UE support to take steps                             Pertinent Vitals/Pain Pain Assessment Pain Assessment: Faces Faces Pain Scale: Hurts little more Pain Location: LLE (especially plantar foot) Pain Descriptors / Indicators: Grimacing, Guarding, Tender Pain Intervention(s): Monitored during session, Limited activity within patient's tolerance    Home Living Family/patient expects to be discharged to:: Private residence Living Arrangements: Alone Available Help at Discharge: Family;Friend(s);Available PRN/intermittently Type  of Home: House Home Access: Level entry       Home Layout: One level Home Equipment: Agricultural consultant (2 wheels);Grab bars - tub/shower;Hand held  shower head Additional Comments: Reports his buddy is getting him a wheelchair since vascular sx said next time will likely be LLE amputation    Prior Function Prior Level of Function : Independent/Modified Independent;Working/employed;Driving             Mobility Comments: Typically indep without DME, although limited by increased L foot pain. 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 Dominance        Extremity/Trunk Assessment   Upper Extremity Assessment Upper Extremity Assessment: Overall WFL for tasks assessed    Lower Extremity Assessment Lower Extremity Assessment: LLE deficits/detail LLE Deficits / Details: s/p LLE vascular sx with expected post-op limitations (pain, weakness, stiffness); h/o toe amputation; noted redness/swelling       Communication   Communication: No difficulties  Cognition Arousal/Alertness: Awake/alert Behavior During Therapy: WFL for tasks assessed/performed Overall Cognitive Status: Within Functional Limits for tasks assessed                                 General Comments: WFL for simple tasks; not formally assessed        General Comments General comments (skin integrity, edema, etc.): pt familiar with education from multiple previous vascular sxs; reviewed educ re: precautions, positioning, activity recommendations, importance of mobility. Pt aware next vascular intervention will likely be amputation (he reports per vascular MD); seems to have good acceptance of this    Exercises     Assessment/Plan    PT Assessment Patient does not need any further PT services  PT Problem List         PT Treatment Interventions      PT Goals (Current goals can be found in the Care Plan section)  Acute Rehab PT Goals Patient Stated Goal: I hope to be able to work a bit more before I have to retire PT Goal Formulation: All assessment and education complete, DC therapy    Frequency       Co-evaluation                AM-PAC PT "6 Clicks" Mobility  Outcome Measure Help needed turning from your back to your side while in a flat bed without using bedrails?: None Help needed moving from lying on your back to sitting on the side of a flat bed without using bedrails?: None Help needed moving to and from a bed to a chair (including a wheelchair)?: A Little Help needed standing up from a chair using your arms (e.g., wheelchair or bedside chair)?: A Little Help needed to walk in hospital room?: A Little Help needed climbing 3-5 steps with a railing? : A Little 6 Click Score: 20    End of Session Equipment Utilized During Treatment: Gait belt Activity Tolerance: Patient tolerated treatment well Patient left: in chair;with call bell/phone within reach;with chair alarm set Nurse Communication: Mobility status PT Visit Diagnosis: Other abnormalities of gait and mobility (R26.89);Pain Pain - Right/Left: Left Pain - part of body: Leg    Time: 5643-3295 PT Time Calculation (min) (ACUTE ONLY): 22 min   Charges:   PT Evaluation $PT Eval Moderate Complexity: 1 Mod        Ina Homes, PT, DPT Acute Rehabilitation Services  Pager [phone] Office [phone]  Robert Sanchez 11/24/2021, 9:52 AM

## 2021-11-24 NOTE — Evaluation (Signed)
Occupational Therapy Evaluation Patient Details Name: Robert Sanchez MRN: [id number] DOB: 07/28/1956 Today's Date: 11/24/2021   History of Present Illness Pt is a 66 y.o. male with LLE critical limb ischemia, admitted 11/23/21 for LLE endarterectomy, femoral to anterior tibial artery bypass. PMH includes tobacco use, COPD, DM, neuropathy, CAD, HLD, MI, PVD, arthritis, HOH.   Clinical Impression   PTA, pt was living alone and was independent. Currently, pt performing ADLs and functional mobility using RW at Mod I level. Provided education on LB ADLs, toilet transfer, and tub transfer; pt verbalized understanding. Answered all pt questions. Recommend dc home once medically stable per physician. All acute OT needs met and will sign off. Thank you.    Recommendations for follow up therapy are one component of a multi-disciplinary discharge planning process, led by the attending physician.  Recommendations may be updated based on patient status, additional functional criteria and insurance authorization.   Follow Up Recommendations  No OT follow up    Assistance Recommended at Discharge Intermittent Supervision/Assistance  Patient can return home with the following      Functional Status Assessment  Patient has had a recent decline in their functional status and demonstrates the ability to make significant improvements in function in a reasonable and predictable amount of time.  Equipment Recommendations  None recommended by OT    Recommendations for Other Services       Precautions / Restrictions Precautions Precautions: Fall Restrictions Weight Bearing Restrictions: No      Mobility Bed Mobility Overal bed mobility: Modified Independent             General bed mobility comments: HOB elevated    Transfers Overall transfer level: Needs assistance Equipment used: Rolling walker (2 wheels) Transfers: Sit to/from Stand Sit to Stand: Supervision           General  transfer comment: Supervision for safety      Balance Overall balance assessment: Needs assistance   Sitting balance-Leahy Scale: Good Sitting balance - Comments: able to don bilateral socks sitting EOB   Standing balance support: No upper extremity supported, During functional activity, Bilateral upper extremity supported Standing balance-Leahy Scale: Fair Standing balance comment: can static stand wtihout UE support; reliant on UE support to take steps                           ADL either performed or assessed with clinical judgement   ADL Overall ADL's : Modified independent                                       General ADL Comments: Pt performing at Mod I level for ADLs. Providing education on compensatory techniques for LB dressing, tub transfer, and toileting. Pt verbalized understanding and stated he feels confident with sit<>stand.     Vision         Perception     Praxis      Pertinent Vitals/Pain Pain Assessment Pain Assessment: Faces Faces Pain Scale: Hurts little more Pain Location: LLE (especially plantar foot) Pain Descriptors / Indicators: Grimacing, Guarding, Tender Pain Intervention(s): Monitored during session, Limited activity within patient's tolerance, Repositioned     Hand Dominance Right   Extremity/Trunk Assessment Upper Extremity Assessment Upper Extremity Assessment: Overall WFL for tasks assessed   Lower Extremity Assessment Lower Extremity Assessment: Defer to PT evaluation LLE Deficits / Details:  s/p LLE vascular sx with expected post-op limitations (pain, weakness, stiffness); h/o toe amputation; noted redness/swelling   Cervical / Trunk Assessment Cervical / Trunk Assessment: Normal   Communication Communication Communication: No difficulties   Cognition Arousal/Alertness: Awake/alert Behavior During Therapy: WFL for tasks assessed/performed Overall Cognitive Status: Within Functional Limits for tasks  assessed                                       General Comments  pt familiar with education from multiple previous vascular sxs; reviewed educ re: precautions, positioning, activity recommendations, importance of mobility. Pt aware next vascular intervention will likely be amputation (he reports per vascular MD); seems to have good acceptance of this    Exercises     Shoulder Instructions      Home Living Family/patient expects to be discharged to:: Private residence Living Arrangements: Alone Available Help at Discharge: Family;Friend(s);Available PRN/intermittently Type of Home: House Home Access: Level entry     Home Layout: One level     Bathroom Shower/Tub: Teacher, early years/pre: Standard     Home Equipment: Conservation officer, nature (2 wheels);Grab bars - tub/shower;Hand held shower head   Additional Comments: Reports his buddy is getting him a wheelchair since vascular sx said next time will likely be LLE amputation      Prior Functioning/Environment Prior Level of Function : Independent/Modified Independent;Working/employed;Driving             Mobility Comments: Typically indep without DME, although limited by increased L foot pain. ADLs Comments: Works as Development worker, community. Enjoys caring for cat, Lucky.        OT Problem List: Decreased activity tolerance;Impaired balance (sitting and/or standing);Decreased knowledge of precautions      OT Treatment/Interventions:      OT Goals(Current goals can be found in the care plan section) Acute Rehab OT Goals Patient Stated Goal: Go home OT Goal Formulation: All assessment and education complete, DC therapy  OT Frequency:      Co-evaluation              AM-PAC OT "6 Clicks" Daily Activity     Outcome Measure Help from another person eating meals?: None Help from another person taking care of personal grooming?: None Help from another person toileting, which includes using toliet, bedpan, or  urinal?: None Help from another person bathing (including washing, rinsing, drying)?: None Help from another person to put on and taking off regular upper body clothing?: None Help from another person to put on and taking off regular lower body clothing?: None 6 Click Score: 24   End of Session Equipment Utilized During Treatment: Rolling walker (2 wheels) Nurse Communication: Mobility status  Activity Tolerance: Patient tolerated treatment well Patient left: in bed;with call bell/phone within reach  OT Visit Diagnosis: Unsteadiness on feet (R26.81);Other abnormalities of gait and mobility (R26.89);Muscle weakness (generalized) (M62.81)                Time: 3300-7622 OT Time Calculation (min): 13 min Charges:  OT General Charges $OT Visit: 1 Visit OT Evaluation $OT Eval Low Complexity: 1 Low  Destin Vinsant MSOT, OTR/L Acute Rehab Pager: [phone] Office: [place] 11/24/2021, 12:21 PM

## 2021-11-24 NOTE — Progress Notes (Addendum)
Progress Note    11/24/2021 6:51 AM 1 Day Post-Op  Subjective:  says his foot feels a lot better.  Wasn't able to touch it before surgery.  Afebrile HR 70's NSR 100's-120's systolic 97% 1LO2NC  Gtts:  none  Vitals:   11/23/21 2312 11/24/21 0417  BP: (!) 108/58 (!) 126/59  Pulse: 75 80  Resp: 16 10  Temp: 97.7 F (36.5 C) 97.6 F (36.4 C)  SpO2: 92% 97%    Physical Exam: Cardiac:  regular Lungs:  non labored Incisions:  bilateral groin incisions look good; left below knee incision looks good.  Extremities:  brisk doppler signals left DP/peroneal; left foot is warm.  Left calf is soft and non tender.    CBC    Component Value Date/Time   WBC 13.1 (H) 11/24/2021 0219   RBC 3.53 (L) 11/24/2021 0219   HGB 10.0 (L) 11/24/2021 0219   HCT 31.5 (L) 11/24/2021 0219   PLT 257 11/24/2021 0219   MCV 89.2 11/24/2021 0219   MCH 28.3 11/24/2021 0219   MCHC 31.7 11/24/2021 0219   RDW 14.1 11/24/2021 0219   LYMPHSABS 2.4 08/27/2021 2316   MONOABS 1.1 (H) 08/27/2021 2316   EOSABS 0.2 08/27/2021 2316   BASOSABS 0.1 08/27/2021 2316    BMET    Component Value Date/Time   NA 138 11/24/2021 0219   NA 137 06/27/2020 1216   K 4.6 11/24/2021 0219   CL 101 11/24/2021 0219   CO2 25 11/24/2021 0219   GLUCOSE 239 (H) 11/24/2021 0219   BUN 21 11/24/2021 0219   BUN 17 06/27/2020 1216   CREATININE 1.46 (H) 11/24/2021 0219   CREATININE 0.81 08/14/2014 1126   CALCIUM 8.5 (L) 11/24/2021 0219   GFRNONAA 54 (L) 11/24/2021 0219   GFRNONAA >89 08/14/2014 1126   GFRAA 58 (L) 08/05/2020 0524   GFRAA >89 08/14/2014 1126    INR    Component Value Date/Time   INR 1.1 11/23/2021 0639     Intake/Output Summary (Last 24 hours) at 11/24/2021 0651 Last data filed at 11/24/2021 0452 Gross per 24 hour  Intake 3276.03 ml  Output 2400 ml  Net 876.03 ml     Assessment/Plan:  66 y.o. male is s/p:  1.  Redo exposure of left common femoral artery greater than 30 days 2.  Harvest of  right leg great saphenous vein in the proximal thigh 3.  Left common femoral endarterectomy with profundoplasty and bovine pericardial patch angioplasty including patch angioplasty of the profunda 4.  Left common femoral artery to anterior tibial artery bypass with composite 6 mm ringed PTFE and great saphenous vein (tunneled lateral subcutaneously)  1 Day Post-Op   -pt with brisk doppler signals left foot.  Incisions look fine.  Calf is soft and non tender.  -creatinine elevated this am at 1.46 with good UOP of 1900cc/24hr. will d/c Cozaar for now.  Continue IVF today 50cc/hr x 24 hrs and check BMP tomorrow morning.  -hyperglycemic with glucose of 224-will restart home dose of insulin at 1/2 dose of 15U bid with meals.  Ok to continue Metformin with GFR of 54. -DVT prophylaxis:  sq heparin to start this am -increase mobilization today -discussed the importance of smoking cessation with pt.    Doreatha Massed, PA-C [facility] [phone] 11/24/2021 6:51 AM  I have seen and evaluated the patient. I agree with the PA note as documented above.  Postop day 1 status post left common femoral endarterectomy with profundoplasty and bovine  patch and then left common femoral to AT bypass with composite graft for CLI with rest pain.  This is in the setting of many failed revascularization procedures in the past.  Looks good this morning and incisions are all clean dry and intact.  Palpable DP pulse in the foot.  Symptoms resolved.  Plan aspirin and high dose statin.  Slight bump in creatinine from 1.18 to 1.4.  We will continue gentle hydration today and hold Cozaar.  Recheck BMP tomorrow.  PT OT mobilize today.  Foley removed.  Restart home insulin.  Cephus Shelling, MD [facility] [place] Office: [phone]

## 2021-11-24 NOTE — Plan of Care (Signed)
°  Problem: Health Behavior/Discharge Planning: Goal: Ability to manage health-related needs will improve Outcome: Progressing   Problem: Clinical Measurements: Goal: Ability to maintain clinical measurements within normal limits will improve Outcome: Progressing Goal: Will remain free from infection Outcome: Progressing Goal: Cardiovascular complication will be avoided Outcome: Progressing   Problem: Activity: Goal: Risk for activity intolerance will decrease Outcome: Progressing   Problem: Elimination: Goal: Will not experience complications related to bowel motility Outcome: Progressing Goal: Will not experience complications related to urinary retention Outcome: Progressing   Problem: Pain Managment: Goal: General experience of comfort will improve Outcome: Progressing

## 2021-11-24 NOTE — Plan of Care (Signed)
°  Problem: Clinical Measurements: °Goal: Will remain free from infection °Outcome: Progressing °  °Problem: Activity: °Goal: Risk for activity intolerance will decrease °Outcome: Progressing °  °Problem: Nutrition: °Goal: Adequate nutrition will be maintained °Outcome: Progressing °  °Problem: Elimination: °Goal: Will not experience complications related to bowel motility °Outcome: Progressing °  °

## 2021-11-25 LAB — BASIC METABOLIC PANEL
Anion gap: 7 (ref 5–15)
BUN: 18 mg/dL (ref 8–23)
CO2: 22 mmol/L (ref 22–32)
Calcium: 8.2 mg/dL — ABNORMAL LOW (ref 8.9–10.3)
Chloride: 106 mmol/L (ref 98–111)
Creatinine, Ser: 1.36 mg/dL — ABNORMAL HIGH (ref 0.61–1.24)
GFR, Estimated: 58 mL/min — ABNORMAL LOW (ref >60)
Glucose, Bld: 106 mg/dL — ABNORMAL HIGH (ref 70–99)
Potassium: 4.1 mmol/L (ref 3.5–5.1)
Sodium: 135 mmol/L (ref 135–145)

## 2021-11-25 LAB — GLUCOSE, CAPILLARY
Glucose-Capillary: 122 mg/dL — ABNORMAL HIGH (ref 70–99)
Glucose-Capillary: 106 mg/dL — ABNORMAL HIGH (ref 70–99)
Glucose-Capillary: 113 mg/dL — ABNORMAL HIGH (ref 70–99)
Glucose-Capillary: 129 mg/dL — ABNORMAL HIGH (ref 70–99)

## 2021-11-25 NOTE — Progress Notes (Signed)
Mobility Specialist: Progress Note   11/25/21 1202  Mobility  Activity Ambulated with assistance in hallway  Level of Assistance Standby assist, set-up cues, supervision of patient - no hands on  Assistive Device Front wheel walker  Distance Ambulated (ft) 160 ft  Activity Response Tolerated well  $Mobility charge 1 Mobility   Pre-Mobility: 82 HR, 95% SpO2 Post-Mobility: 92 HR, 140/73 BP, 99% SpO2  Pt received in bed and agreeable to mobility. C/o L foot pain they rated 9/10 during mobility. Pt sitting EOB after session with all needs met.    [facility] Joleah Kosak Mobility Specialist Mobility Specialist [address] [place]: [phone] Mobility Specialist 2 [place]: [phone]

## 2021-11-25 NOTE — Progress Notes (Addendum)
[facility] [place]  Subjective  - Still having pain with movement and to touch with erythema.   Objective (!) 148/72 85 (!) 97.5 F (36.4 C) (Oral) 20 99%  Intake/Output Summary (Last 24 hours) at 11/25/2021 0748 Last data filed at 11/25/2021 0400 Gross per 24 hour  Intake 1383.37 ml  Output 2025 ml  Net -641.63 ml    Left LE ext with DP/peroneal Left groin incision healing well Right groin soft post angiogram without hematoma A & O x 3 general no acute distress Lungs non labored breathing   Assessment/Planning: POD # 2   66 y.o. male is s/p:  1.  Redo exposure of left common femoral artery greater than 30 days 2.  Harvest of right leg great saphenous vein in the proximal thigh 3.  Left common femoral endarterectomy with profundoplasty and bovine pericardial patch angioplasty including patch angioplasty of the profunda 4.  Left common femoral artery to anterior tibial artery bypass with composite 6 mm ringed PTFE and great saphenous vein (tunneled lateral subcutaneously)   Patent bypass with AT/DP signal Left foot is erythematous without ischemic skin changes Slight improvement with Cr now 1.36 Urine OP good 2,175 cc Encouraged mobility even when he is in bed. Leukocytosis 13.1 without fever  Heparin for DVT prophlasix ASA and daily Statin  Mosetta Pigeon 11/25/2021 7:48 AM --  Laboratory Lab Results: Recent Labs    11/23/21 1250 11/24/21 0219  WBC 20.6* 13.1*  HGB 11.4* 10.0*  HCT 34.7* 31.5*  PLT 304 257   BMET Recent Labs    11/24/21 0219 11/25/21 0111  NA 138 135  K 4.6 4.1  CL 101 106  CO2 25 22  GLUCOSE 239* 106*  BUN 21 18  CREATININE 1.46* 1.36*  CALCIUM 8.5* 8.2*    COAG Lab Results  Component Value Date   INR 1.1 11/23/2021   INR 1.0 01/06/2021   INR 1.1 08/07/2020   No results found for: PTT  I have seen and evaluated the patient. I agree with the PA note as documented above.  Postop day 2  status post left common femoral to AT bypass with common femoral endarterectomy profundoplasty and bovine patch.  Incisions look good.  He has a palpable AT and DP pulse at the foot.  Overall feels he needs one more day to improve his mobility.  We will plan aspirin and high-dose statin at discharge.  PT has evaluated him and no further follow-up recommended.  Cephus Shelling, MD [facility] [place] Office: [phone]

## 2021-11-26 ENCOUNTER — Other Ambulatory Visit (HOSPITAL_COMMUNITY): Payer: Self-pay

## 2021-11-26 LAB — BASIC METABOLIC PANEL
Anion gap: 12 (ref 5–15)
BUN: 16 mg/dL (ref 8–23)
CO2: 28 mmol/L (ref 22–32)
Calcium: 9.1 mg/dL (ref 8.9–10.3)
Chloride: 98 mmol/L (ref 98–111)
Creatinine, Ser: 1.29 mg/dL — ABNORMAL HIGH (ref 0.61–1.24)
GFR, Estimated: >60 mL/min (ref >60)
Glucose, Bld: 106 mg/dL — ABNORMAL HIGH (ref 70–99)
Potassium: 4.5 mmol/L (ref 3.5–5.1)
Sodium: 138 mmol/L (ref 135–145)

## 2021-11-26 LAB — CBC
HCT: 31.2 % — ABNORMAL LOW (ref 39.0–52.0)
Hemoglobin: 9.8 g/dL — ABNORMAL LOW (ref 13.0–17.0)
MCH: 28 pg (ref 26.0–34.0)
MCHC: 31.4 g/dL (ref 30.0–36.0)
MCV: 89.1 fL (ref 80.0–100.0)
Platelets: 276 10*3/uL (ref 150–400)
RBC: 3.5 MIL/uL — ABNORMAL LOW (ref 4.22–5.81)
RDW: 14.6 % (ref 11.5–15.5)
WBC: 12.3 10*3/uL — ABNORMAL HIGH (ref 4.0–10.5)
nRBC: 0 % (ref 0.0–0.2)

## 2021-11-26 LAB — GLUCOSE, CAPILLARY: Glucose-Capillary: 129 mg/dL — ABNORMAL HIGH (ref 70–99)

## 2021-11-26 MED ORDER — OXYCODONE-ACETAMINOPHEN 5-325 MG PO TABS
1.0000 | ORAL_TABLET | Freq: Four times a day (QID) | ORAL | 0 refills | Status: DC | PRN
  Filled 2021-11-26: qty 30, 8d supply, fill #0

## 2021-11-26 MED ORDER — ORAL CARE MOUTH RINSE
15.0000 mL | Freq: Two times a day (BID) | OROMUCOSAL | Status: DC
  Administered 2021-11-26: 15 mL via OROMUCOSAL

## 2021-11-26 NOTE — Discharge Summary (Signed)
Bypass Discharge Summary Patient ID: Robert Sanchez [id number] y.o. [birth year]  Admit date: 11/23/2021  Discharge date and time: No discharge date for patient encounter.   Admitting Physician: [name], MD   Discharge Physician: Monica Martinez, MD  Admission Diagnoses: PAD (peripheral artery disease) [facility]) [I73.9]  Discharge Diagnoses: PAD  Admission Condition: fair  Discharged Condition: fair  Indication for Admission: 66 year old male whose has had multiple failed previous left lower extremity interventions including a left common femoral to PT bypass with vein and then subsequent multiple thrombolysis and then revision with jump graft.  His most recent bypass is occluded and he only has an anterior tibial target into the foot.  He presents for planned redo left common femoral to anterior tibial bypass after risk benefits discussed.  An assistant was needed for exposure and to expedite the case.     Hospital Course: 66 year old male was admitted on 11/20/21 and underwent Aortogram with LLE arteriogram by Dr. Stanford Breed. This showed >60% stenosis of CFA, occlusion of left SFA and below knee popliteal artery with reconstruction of all tibial vessels in mid calf, with AT dominant runoff. He was therefore indicated for LLE bypass. On 11/23/21 he subsequently underwent redo exposure of left common femoral artery, harvest of right leg greater saphenous vein in the proximal thigh, left common femoral endarterectomy with profundoplasty and bovine pericardial patch angioplasty including patch angioplasty of the profunda, and Left common femoral artery to anterior tibial artery bypass with composite 6 mm ringed PTFE and great saphenous vein (tunneled lateral subcutaneously) by Dr. Carlis Abbott. He tolerated the procedure well and was taken to the recovery room in stable condition.   By POD#1, he remained stable. Left foot pain improved. Bilateral groin incisions well appearing. Brisk doppler  signals in left DP and peroneal present. Hemodynamically stable. Scr slightly increased. Continued IV fluids and restarted on home insulin with some hyperglycemia over night.  Encouraged mobilization. PT/OT evaluated patient and did not feel he needed any follow up.   The remainder of his hospital stay consisted of pain control, increasing mobility, continued good urine output and improvement of Scr to baseline as well as tolerance of oral intake.   By POD#3, he remained stable for discharge home. Pain better controlled. Mobilization improved. Incisions intact and well appearing. He was instructed to continue Aspirin and statin on Discharge. Post operative pain medication sent to the transition of care pharmacy per patient request. Follow up arranged in 2-3 weeks to see Dr. Carlis Abbott.    Consults: None  Treatments: analgesia: Morphine and Oxycodone Acetaminophen and surgery:  1.  Redo exposure of left common femoral artery greater than 30 days 2.  Harvest of right leg great saphenous vein in the proximal thigh 3.  Left common femoral endarterectomy with profundoplasty and bovine pericardial patch angioplasty including patch angioplasty of the profunda 4.  Left common femoral artery to anterior tibial artery bypass with composite 6 mm ringed PTFE and great saphenous vein (tunneled lateral subcutaneously)     Disposition: Discharge disposition: 01-Home or Self Care       - For [facility] Registry use ---  Post-op:  Wound infection: No  Graft infection: No  Transfusion: No  If yes, 0 units given New Arrhythmia: No Patency judged by: '[ ]'  Dopper only, '[ ]'  Palpable graft pulse, [ X] Palpable distal pulse, '[ ]'  ABI inc. > 0.15, '[ ]'  Duplex D/C Ambulatory Status: Ambulatory with Assistance  Complications: MI: Valu.Nieves ] No, '[ ]'  Troponin  only, '[ ]'  EKG or Clinical CHF: No Resp failure: [ X] none, '[ ]'  Pneumonia, '[ ]'  Ventilator Chg in renal function: Valu.Nieves ] none, '[ ]'  Inc. Cr > 0.5, '[ ]'  Temp. Dialysis, '[ ]'   Permanent dialysis Stroke: [ X] None, '[ ]'  Minor, '[ ]'  Major Return to OR: No  Reason for return to OR: '[ ]'  Bleeding, '[ ]'  Infection, '[ ]'  Thrombosis, '[ ]'  Revision  Discharge medications: Statin use:  Yes ASA use:  Yes Plavix use:  No  for medical reason not indicated Beta blocker use: Yes Coumadin use: No  for medical reason not indicated, on Xarelto    Patient Instructions:  Allergies as of 11/26/2021       Reactions   Lisinopril Other (See Comments)   Syncope   Codeine Rash        Medication List     TAKE these medications    Accu-Chek Guide test strip Generic drug: glucose blood Use to test blood sugars up to 4 times daily as directed.   Accu-Chek Guide w/Device Kit Use to test blood sugars up to 4 times daily as directed.   Accu-Chek Softclix Lancets lancets Use to test blood sugars up to 4 times daily as directed.   aspirin 81 MG EC tablet Take 1 tablet (81 mg total) by mouth daily. Swallow whole.   atorvastatin 80 MG tablet Commonly known as: LIPITOR Take 1 tablet (80 mg total) by mouth at bedtime.   carvedilol 6.25 MG tablet Commonly known as: COREG Take 1 tablet (6.25 mg total) by mouth 2 (two) times daily with a meal.   furosemide 40 MG tablet Commonly known as: LASIX Take 1 tablet (40 mg total) by mouth daily.   losartan 25 MG tablet Commonly known as: COZAAR Take 0.5 tablets (12.5 mg total) by mouth at bedtime.   metFORMIN 500 MG tablet Commonly known as: GLUCOPHAGE Take 1 tablet (500 mg total) by mouth 2 (two) times daily with a meal.   multivitamin tablet Take 1 tablet by mouth daily.   NovoLIN 70/30 Kwikpen (70-30) 100 UNIT/ML KwikPen Generic drug: insulin isophane & regular human KwikPen Inject 30 units with breakfast and dinner.   oxyCODONE-acetaminophen 5-325 MG tablet Commonly known as: PERCOCET/ROXICET Take 1 tablet by mouth every 6 (six) hours as needed for moderate pain.   potassium chloride SA 20 MEQ tablet Commonly known  as: KLOR-CON M Take 1 tablet (20 mEq total) by mouth daily.   spironolactone 25 MG tablet Commonly known as: ALDACTONE Take 0.5 tablets (12.5 mg total) by mouth daily.   TechLite Pen Needles 32G X 4 MM Misc Generic drug: Insulin Pen Needle Use as directed with insulin pen   Trulicity 8.32 PQ/9.8YM Sopn Generic drug: Dulaglutide Inject 0.75 mg into the skin once a week. What changed: when to take this   Xarelto 20 MG Tabs tablet Generic drug: rivaroxaban Take 1 tablet (20 mg total) by mouth daily with supper.       Activity: no driving while on analgesics and no heavy lifting for 6 weeks Diet: cardiac diet and low fat, low cholesterol diet Wound Care: keep wound clean and dry  Follow-up with Dr. Carlis Abbott in 1 month   Signed: Karoline Caldwell 11/26/2021 9:49 AM

## 2021-11-26 NOTE — Progress Notes (Signed)
[facility]  Subjective  -no complaints.  States he is ready to go home.   Objective 131/75 87 98.2 F (36.8 C) (Oral) 10 94%  Intake/Output Summary (Last 24 hours) at 11/26/2021 0851 Last data filed at 11/26/2021 0841 Gross per 24 hour  Intake 685.77 ml  Output 2250 ml  Net -1564.23 ml    Left groin and left AT exposure incision clean dry and intact with no hematoma Right thigh vein harvest incision c/d/i Left DP palpable Left foot warm  Laboratory Lab Results: Recent Labs    11/24/21 0219 11/26/21 0221  WBC 13.1* 12.3*  HGB 10.0* 9.8*  HCT 31.5* 31.2*  PLT 257 276   BMET Recent Labs    11/25/21 0111 11/26/21 0221  NA 135 138  K 4.1 4.5  CL 106 98  CO2 22 28  GLUCOSE 106* 106*  BUN 18 16  CREATININE 1.36* 1.29*  CALCIUM 8.2* 9.1    COAG Lab Results  Component Value Date   INR 1.1 11/23/2021   INR 1.0 01/06/2021   INR 1.1 08/07/2020   No results found for: PTT  Assessment/Planning:  Postop day 3 status post left common femoral endarterectomy with profundoplasty and a left common femoral to AT bypass with composite.  He feels he is ready for discharge today.  Plan discharge on aspirin plus statin.  I will arrange follow-up in 2 to 3 weeks in the office.  We will give him pain meds for discharge.  Cephus Shelling 11/26/2021 8:51 AM --

## 2021-11-26 NOTE — Plan of Care (Signed)
  Problem: Education: Goal: Knowledge of General Education information will improve Description: Including pain rating scale, medication(s)/side effects and non-pharmacologic comfort measures Outcome: Adequate for Discharge   

## 2021-11-26 NOTE — Progress Notes (Signed)
Explained discharge summary and incision instructions with patient. He verbalized having an understanding of instructions. IV and telemetry was removed by Sue Lush NT. TOC delivered home meds to room and was in patient's care at time of discharge.   Transported off the unit safely for discharge home.

## 2021-11-26 NOTE — Progress Notes (Signed)
Mobility Specialist: Progress Note   11/26/21 1105  Mobility  Activity Refused mobility   Pt refused mobility stating he is getting ready to discharge.  [facility] Mobility Specialist Mobility Specialist [address] [place]: [phone] Mobility Specialist [address] [place]: [phone]

## 2021-11-27 ENCOUNTER — Telehealth: Payer: Self-pay

## 2021-11-27 NOTE — Telephone Encounter (Signed)
Transition Care Management Unsuccessful Follow-up Telephone Call  Date of discharge and from where:  [facility] on 11/26/2021  Attempts:  1st Attempt  Reason for unsuccessful TCM follow-up call:  Unable to leave message no option.

## 2021-11-30 ENCOUNTER — Telehealth: Payer: Self-pay

## 2021-11-30 NOTE — Telephone Encounter (Signed)
Call placed to patient and informed him per Dr Alvis Lemmings:   He can resume his medications which he was taking prior to discharge and we will discuss this at his follow-up visit. He said he understood and was very appreciative of the call.

## 2021-11-30 NOTE — Telephone Encounter (Signed)
Transition Care Management Follow-up Telephone Call Date of discharge and from where: 11/26/2021, [facility] [place]  How have you been since you were released from the hospital? He said he is starting to feel  " a little better" and  his left foot is getting stronger.  Any questions or concerns? Yes - he wanted to know why his insulin was stopped. He acknowledges that it is listed on his AVS but said it's not " checked off"  The trulicity is not checked either.  He has not taken any insulin since returning home.  He said his blood sugars are running about 150-175.informed him that this CM will confirm with Dr Alvis Lemmings that he should be on the insulin.   Items Reviewed: Did the pt receive and understand the discharge instructions provided? Yes  Medications obtained and verified?  He said he is missing one of his medications but is not sure which one. He did not have the med list with him at the time of the call.  Instructed him to review the list and call pharmacy to request a refill of what he needs. Question about insulin noted above  Other? No  Any new allergies since your discharge? No  Dietary orders reviewed? Yes Do you have support at home? Yes  - he lives alone but said that he has neighbors to call if he needs help.   Home Care and Equipment/Supplies: Were home health services ordered? no If so, what is the name of the agency? N/a  Has the agency set up a time to come to the patient's home? not applicable Were any new equipment or medical supplies ordered?  No What is the name of the medical supply agency? N/a Were you able to get the supplies/equipment? not applicable Do you have any questions related to the use of the equipment or supplies? No  Functional Questionnaire: (I = Independent and D = Dependent) ADLs: independent. . Has RW and cane to use if needed   Follow up appointments reviewed:  PCP Hospital f/u appt confirmed?  He said he will call to schedule an appointment.  He  is supposed to follow up with Dr Alvis Lemmings in March.  Specialist Hospital f/u appt confirmed? Yes  Scheduled to see [facility]- 12/22/2021 Are transportation arrangements needed? No  If their condition worsens, is the pt aware to call PCP or go to the Emergency Dept.? Yes Was the patient provided with contact information for the PCP's office or ED? Yes Was to pt encouraged to call back with questions or concerns? Yes

## 2021-11-30 NOTE — Telephone Encounter (Signed)
I do not know why his insulin was stopped as his discharge summary is not complete.  He can resume his medications which he was taking prior to discharge and we will discuss this at his follow-up visit.

## 2021-11-30 NOTE — Telephone Encounter (Signed)
From the discharge call:  He said he is starting to feel  " a little better" and  his left foot is getting stronger.   he wanted to know why his insulin was stopped. He acknowledges that it is listed on his AVS but said it's not " checked off"  The trulicity is not checked either.  He has not taken any insulin since returning home.  He said his blood sugars are running about 150-175.informed him that this CM will confirm with Dr Alvis Lemmings that he should be on the insulin.    He said he is missing one of his medications but is not sure which one. He did not have the med list with him at the time of the call.  Instructed him to review the list and call pharmacy to request a refill of what he needs.   He said he will call to schedule an appointment.  He is supposed to follow up with Dr Alvis Lemmings in March.    Scheduled to see [facility]- 12/22/2021

## 2021-12-01 ENCOUNTER — Telehealth: Payer: Self-pay

## 2021-12-01 NOTE — Telephone Encounter (Signed)
I spoke with pt and advised his Short term Disability forms have been completed. I also informed pt that he will need to sign a Medical records release form in order for our office to send the required Office and OP notes to [employer]. Patient voiced his understanding and states he will stop by office tomorrow to complete Release Forms.

## 2021-12-14 ENCOUNTER — Other Ambulatory Visit: Payer: Self-pay

## 2021-12-15 ENCOUNTER — Other Ambulatory Visit: Payer: Self-pay

## 2021-12-15 ENCOUNTER — Other Ambulatory Visit: Payer: Self-pay | Admitting: Pharmacist

## 2021-12-15 DIAGNOSIS — E1165 Type 2 diabetes mellitus with hyperglycemia: Secondary | ICD-10-CM

## 2021-12-15 MED ORDER — "INSULIN SYRINGE-NEEDLE U-100 30G X 5/16"" 1 ML MISC"
3 refills | Status: AC
  Filled 2021-12-15: qty 100, 30d supply, fill #0
  Filled 2022-03-04: qty 100, 30d supply, fill #1

## 2021-12-15 MED ORDER — HUMULIN 70/30 (70-30) 100 UNIT/ML ~~LOC~~ SUSP
SUBCUTANEOUS | 2 refills | Status: DC
  Filled 2021-12-15: qty 20, 30d supply, fill #0
  Filled 2022-01-20: qty 20, 30d supply, fill #1
  Filled 2022-03-04: qty 20, 30d supply, fill #2

## 2021-12-18 ENCOUNTER — Other Ambulatory Visit: Payer: Self-pay

## 2021-12-21 ENCOUNTER — Other Ambulatory Visit: Payer: Self-pay

## 2021-12-21 ENCOUNTER — Other Ambulatory Visit: Payer: Self-pay | Admitting: Pharmacist

## 2021-12-21 DIAGNOSIS — E1165 Type 2 diabetes mellitus with hyperglycemia: Secondary | ICD-10-CM

## 2021-12-21 MED ORDER — ONETOUCH VERIO VI STRP
ORAL_STRIP | 2 refills | Status: AC
  Filled 2021-12-21: qty 100, 25d supply, fill #0

## 2021-12-21 MED ORDER — ONETOUCH VERIO W/DEVICE KIT
PACK | 0 refills | Status: AC
  Filled 2021-12-21: qty 1, 30d supply, fill #0

## 2021-12-21 MED ORDER — ONETOUCH DELICA PLUS LANCET33G MISC
2 refills | Status: AC
  Filled 2021-12-21: qty 100, 25d supply, fill #0

## 2021-12-22 ENCOUNTER — Ambulatory Visit (INDEPENDENT_AMBULATORY_CARE_PROVIDER_SITE_OTHER): Payer: Self-pay | Admitting: Vascular Surgery

## 2021-12-22 ENCOUNTER — Other Ambulatory Visit: Payer: Self-pay

## 2021-12-22 ENCOUNTER — Encounter: Payer: Self-pay | Admitting: Vascular Surgery

## 2021-12-22 VITALS — BP 119/71 | HR 94 | Temp 98.5°F | Resp 18 | Ht 69.0 in | Wt 215.0 lb

## 2021-12-22 DIAGNOSIS — I739 Peripheral vascular disease, unspecified: Secondary | ICD-10-CM

## 2021-12-22 NOTE — Progress Notes (Signed)
Patient name: Robert Sanchez MRN: [id number] DOB: November 20, 1955 Sex: male  REASON FOR VISIT: Post-op  HPI: Robert Sanchez is a 66 y.o. male with history of diabetes, coronary artery disease, peripheral vascular disease, tobacco abuse that presents for postop check after recent left common femoral endarterectomy with profundoplasty and then left common femoral to anterior tibial bypass with composite PTFE and saphenous vein on 11/23/2021.  States his left foot is doing well.  All of his incisions are healing.  No complaints today.  Taking aspirin statin.  Still smoking.  He has a complex history in the left leg.  He initially underwent left common femoral to PT bypass with reversed saphenous vein in 2019 for CLI with rest pain.  Ultimately he presented to the hospital in March 2021 with an occluded bypass and underwent thrombolysis and subsequent percutaneous mechanical thrombectomy with angioplasty of the bypass and PT on 01/16/20- 01/17/2020.  Ultimately his bypass was salvaged and he was discharged on Eliquis.  During this event he did develop necrotic left great.  He then presented again and his bypass had occluded a second time in September 2021 and underwent second attempt thrombolysis that was unsuccessful.  Ultimately during an attempted thrombolysis we took him back he developed thrombus in the common femoral and iliac and required thrombectomy and ultimately we performed a left common femoral to PT composite bypass using the distal vein on the PT since this appeared to be healthy.  This then thrombosed immediately after hospital discharge.  Then on 08/04/2020 he underwent thrombectomy of the left common femoral to PT composite bypass.  We performed a left retrograde left external iliac angioplasty of an external iliac artery stent.  We then put a bovine patch on the below-knee popliteal artery TP trunk and revised the bypass to the below-knee popliteal artery TP trunk with PTFE.Marland Kitchen  He also got a  partial left toe amp at this time.  He then presented with an occluded bypass and underwent thrombolysis on 01/07/2021 with angioplasty of the distal anastomosis onto the BK pop TP trunk on 01/08/2021.  This then occluded.  Past Medical History:  Diagnosis Date   Acute pulmonary edema ([place]) 06/05/2018   Arthritis    COPD (chronic obstructive pulmonary disease) ([place])    " beginning stages " per pt   Coronary artery disease    Diabetes mellitus without complication ([place])    type 2   Diabetic neuropathy (HCC)    Diabetic neuropathy ([place])    Emphysema/COPD ([place])    " beginning stages"   GERD (gastroesophageal reflux disease)    HLD (hyperlipidemia)    HOH (hard of hearing)    no hearing aids   Myocardial infarction [facility])    Peripheral vascular disease ([place])     Past Surgical History:  Procedure Laterality Date   ABDOMINAL AORTOGRAM W/LOWER EXTREMITY N/A 06/21/2018   Procedure: ABDOMINAL AORTOGRAM W/LOWER EXTREMITY;  Surgeon: [name], MD;  Location: [place] [facility];  Service: Cardiovascular;  Laterality: N/A;   ABDOMINAL AORTOGRAM W/LOWER EXTREMITY Left 01/16/2020   Procedure: ABDOMINAL AORTOGRAM W/LOWER EXTREMITY;  Surgeon: [name], MD;  Location: [place] [facility];  Service: Cardiovascular;  Laterality: Left;   ABDOMINAL AORTOGRAM W/LOWER EXTREMITY N/A 07/09/2020   Procedure: ABDOMINAL AORTOGRAM W/LOWER EXTREMITY;  Surgeon: [name], MD;  Location: [place] [facility];  Service: Cardiovascular;  Laterality: N/A;  TPA infusion   ABDOMINAL AORTOGRAM W/LOWER EXTREMITY Bilateral 11/20/2021   Procedure: ABDOMINAL AORTOGRAM W/LOWER  EXTREMITY;  Surgeon: Cherre Robins, MD;  Location: [place] [facility];  Service: Cardiovascular;  Laterality: Bilateral;   AMPUTATION Left 08/04/2020   Procedure: LEFT PARTIAL GREAT TOE AMPUTATION;  Surgeon: [name], MD;  Location: [place];  Service: Vascular;  Laterality: Left;   ANGIOPLASTY Left 11/23/2021    Procedure: ANGIOPLASTY with BOVINE PATCH 1cmx6cm;  Surgeon: [name], MD;  Location: [place];  Service: Vascular;  Laterality: Left;   APPLICATION OF WOUND VAC Left 08/04/2020   Procedure: LEFT GROIN DEBRIDEMENT WITH APPLICATION OF WOUND [place];  Surgeon: [name], MD;  Location: [place];  Service: Vascular;  Laterality: Left;   CORONARY ARTERY BYPASS GRAFT N/A 06/12/2018   Procedure: CORONARY ARTERY BYPASS GRAFTING (CABG) x 3 WITH ENDOSCOPIC HARVESTING OF RIGHT GREATER SAPHENOUS VEIN. LIMA TO LAD. SVG TO PD. SVG TO DIAGONAL.;  Surgeon: Ivin Poot, MD;  Location: [place];  Service: Open Heart Surgery;  Laterality: N/A;   ENDARTERECTOMY FEMORAL Left 11/23/2021   Procedure: ENDARTERECTOMY FEMORAL;  Surgeon: [name], MD;  Location: [place];  Service: Vascular;  Laterality: Left;   FEMORAL-POPLITEAL BYPASS GRAFT Left 07/10/2020   Procedure: REDO EXPOSURE OF LEFT COMMON FEMORAL ARTERY LEFT COMMON FEMORAL TO POSTERIOR TIBIAL COMPOSITE BYPASS GRAFT;  Surgeon: [name], MD;  Location: [place];  Service: Vascular;  Laterality: Left;   FEMORAL-TIBIAL BYPASS GRAFT Left 08/21/2018   Procedure: BYPASS GRAFT LEFT FEMORAL TO POSTERIOR TIBIAL ARTERY USING LEFT REVERSED GREAT SAPHENOUS VEIN;  Surgeon: [name], MD;  Location: [place];  Service: Vascular;  Laterality: Left;   FEMORAL-TIBIAL BYPASS GRAFT Left 08/04/2020   Procedure: REVISION FEMORAL-DISTAL BYPASS WITH BIOLOGICAL BOVINE PATCH;  Surgeon: [name], MD;  Location: [place];  Service: Vascular;  Laterality: Left;   FEMORAL-TIBIAL BYPASS GRAFT Left 11/23/2021   Procedure: REDO LEFT FEMORAL-TIBIAL ARTERY BYPASS GRAFT USING PROPATEN 55m VASCULAR GRAFT REMOVABLE [place]  Surgeon: CMarty Heck MD;  Location[place]  Service: Vascular;  Laterality: Left;  Insert arterial line   KNEE ARTHROSCOPY     LOWER EXTREMITY ANGIOGRAM Left 08/04/2020   Procedure: LEFT ILIAC ANGIOGRAM, ANGIOPLASTY OF LEFT ILIAC  STENT WITH DRUG COATED BALLOON;  Surgeon: CMarty Heck MD;  [place]  Service: Vascular;  Laterality: Left;   LOWER EXTREMITY ANGIOGRAPHY Left 01/17/2020   Procedure: Lower Extremity Angiography;  Surgeon: CMarty Heck MD;  [place];  Service: Cardiovascular;  Laterality: Left;   LOWER EXTREMITY ANGIOGRAPHY N/A 01/07/2021   Procedure: LOWER EXTREMITY ANGIOGRAPHY;  Surgeon: HCherre Robins MD;  [facility];  Service: Cardiovascular;  Laterality: N/A;   PERIPHERAL VASCULAR BALLOON ANGIOPLASTY Left 01/17/2020   Procedure: PERIPHERAL VASCULAR BALLOON ANGIOPLASTY;  Surgeon: CMarty Heck MD;  [facility];  Service: Cardiovascular;  Laterality: Left;  pt   PERIPHERAL VASCULAR INTERVENTION Left 06/21/2018   Procedure: PERIPHERAL VASCULAR INTERVENTION;  Surgeon: CMarty Heck MD;  [facility];  Service: Cardiovascular;  Laterality: Left;   PERIPHERAL VASCULAR INTERVENTION Left 01/08/2021   Procedure: PERIPHERAL VASCULAR INTERVENTION;  Surgeon: CMarty Heck MD;  [place];  Service: Cardiovascular;  Laterality: Left;   PERIPHERAL VASCULAR THROMBECTOMY Left 01/16/2020   Procedure: PERIPHERAL VASCULAR THROMBECTOMY;  Surgeon: CMarty Heck MD;  [facility] [place];  Service: Cardiovascular;  Laterality: Left;  Lytic Catheter Placement left fem-pop bypass   PERIPHERAL VASCULAR THROMBECTOMY Left 01/17/2020  Procedure: PERIPHERAL VASCULAR THROMBECTOMY;  Surgeon: [name], MD;  Location: [place] [facility];  Service: Cardiovascular;  Laterality: Left;  fem-pt bypass   PERIPHERAL VASCULAR THROMBECTOMY Left 07/10/2020   Procedure: lysis recheck;  Surgeon: [name], MD;  Location: [place] [facility];  Service: Cardiovascular;  Laterality: Left;   PULMONARY THROMBECTOMY N/A 01/08/2021   Procedure: LYSIS RECHECK;  Surgeon: [name], MD;   Location: [place] [facility];  Service: Cardiovascular;  Laterality: N/A;   RIGHT/LEFT HEART CATH AND CORONARY ANGIOGRAPHY N/A 06/05/2018   Procedure: RIGHT/LEFT HEART CATH AND CORONARY ANGIOGRAPHY;  Surgeon: Dixie Dials, MD;  Location: [place] [facility];  Service: Cardiovascular;  Laterality: N/A;   SPINE SURGERY     TEE WITHOUT CARDIOVERSION N/A 06/12/2018   Procedure: TRANSESOPHAGEAL ECHOCARDIOGRAM (TEE);  Surgeon: Prescott Gum, Collier Salina, MD;  Location: [place];  Service: Open Heart Surgery;  Laterality: N/A;   teeth extractions     THROMBECTOMY FEMORAL ARTERY Left 08/04/2020   Procedure: THROMBECTOMY OF LEFT FEMORAL TP COMPOSTITE BYPASS;  Surgeon: [name], MD;  Location: [place];  Service: Vascular;  Laterality: Left;   THROMBECTOMY ILIAC ARTERY Left 07/10/2020   Procedure: THROMBECTOMY OF LEFT EXTERNAL ILIAC AND LEFT COMMON FEMORAL AND LEFT POSTERIOR TIBIAL ARTERIES;  Surgeon: [name], MD;  Location: [place];  Service: Vascular;  Laterality: Left;   ULTRASOUND GUIDANCE FOR VASCULAR ACCESS Bilateral 11/23/2021   Procedure: ULTRASOUND GUIDANCE FOR VASCULAR ACCESS;  Surgeon: [name], MD;  Location: [place];  Service: Vascular;  Laterality: Bilateral;   VEIN HARVEST Left 08/21/2018   Procedure: VEIN HARVEST LEFT GREAT SAPHENOUS VEIN;  Surgeon: [name], MD;  Location: [place];  Service: Vascular;  Laterality: Left;   VEIN HARVEST Right 11/23/2021   Procedure: VEIN HARVEST;  Surgeon: [name], MD;  Location: [place];  Service: Vascular;  Laterality: Right;    Family History  Problem Relation Age of Onset   Diabetes Mother    Lung disease Mother    Cancer Father    Heart disease Father     SOCIAL HISTORY: Social History   Tobacco Use   Smoking status: Some Days    Packs/day: 1.00    Years: 50.00    Pack years: 50.00    Types: Cigarettes   Smokeless tobacco: Never   Tobacco comments:    1PPD x 50 years. Quit after CABG, now occasionally  smokes.   Substance Use Topics   Alcohol use: Yes    Comment: occasional    Allergies  Allergen Reactions   Lisinopril Other (See Comments)    Syncope   Codeine Rash    Current Outpatient Medications  Medication Sig Dispense Refill   aspirin 81 MG EC tablet Take 1 tablet (81 mg total) by mouth daily. Swallow whole.     atorvastatin (LIPITOR) 80 MG tablet Take 1 tablet (80 mg total) by mouth at bedtime. 30 tablet 3   Blood Glucose Monitoring Suppl (ONETOUCH VERIO) w/Device KIT Use to test blood sugars up to 4 times daily as directed. 1 kit 0   carvedilol (COREG) 6.25 MG tablet Take 1 tablet (6.25 mg total) by mouth 2 (two) times daily with a meal. 60 tablet 5   Dulaglutide (TRULICITY) 6.37 CH/8.8FO SOPN Inject 0.75 mg into the skin once a week. (Patient taking differently: Inject 0.75 mg into the skin every Wednesday.) 2 mL 3   furosemide (LASIX) 40 MG tablet Take 1 tablet (40  mg total) by mouth daily. 30 tablet 5   glucose blood (ONETOUCH VERIO) test strip Use to test blood sugars up to 4 times daily as directed. 100 each 2   insulin NPH-regular Human (HUMULIN 70/30) (70-30) 100 UNIT/ML injection Inject 30 units subq with breakfast and 30 units subq with dinner. 20 mL 2   Insulin Pen Needle 32G X 4 MM MISC Use as directed with insulin pen 200 each 0   Insulin Syringe-Needle U-100 30G X 5/16" 1 ML MISC Use to inject insulin twice daily. 100 each 3   Lancets (ONETOUCH DELICA PLUS [id number]) MISC Use to test blood sugars up to 4 times daily as directed. 100 each 2   losartan (COZAAR) 25 MG tablet Take 0.5 tablets (12.5 mg total) by mouth at bedtime. 45 tablet 3   metFORMIN (GLUCOPHAGE) 500 MG tablet Take 1 tablet (500 mg total) by mouth 2 (two) times daily with a meal. 60 tablet 5   Multiple Vitamin (MULTIVITAMIN) tablet Take 1 tablet by mouth daily.     oxyCODONE-acetaminophen (PERCOCET/ROXICET) 5-325 MG tablet Take 1 tablet by mouth every 6 (six) hours as needed for moderate pain. 30  tablet 0   potassium chloride SA (KLOR-CON M) 20 MEQ tablet Take 1 tablet (20 mEq total) by mouth daily. 30 tablet 5   rivaroxaban (XARELTO) 20 MG TABS tablet Take 1 tablet (20 mg total) by mouth daily with supper. 30 tablet 5   spironolactone (ALDACTONE) 25 MG tablet Take 0.5 tablets (12.5 mg total) by mouth daily. 45 tablet 3   No current facility-administered medications for this visit.    REVIEW OF SYSTEMS:  _0  denotes positive finding, _1  denotes negative finding Cardiac  Comments:  Chest pain or chest pressure:    Shortness of breath upon exertion:    Short of breath when lying flat:    Irregular heart rhythm:        Vascular    Pain in calf, thigh, or hip brought on by ambulation:    Pain in feet at night that wakes you up from your sleep:     Blood clot in your veins:    Leg swelling:         Pulmonary    Oxygen at home:    Productive cough:     Wheezing:         Neurologic    Sudden weakness in arms or legs:     Sudden numbness in arms or legs:     Sudden onset of difficulty speaking or slurred speech:    Temporary loss of vision in one eye:     Problems with dizziness:         Gastrointestinal    Blood in stool:     Vomited blood:         Genitourinary    Burning when urinating:     Blood in urine:        Psychiatric    Major depression:         Hematologic    Bleeding problems:    Problems with blood clotting too easily:        Skin    Rashes or ulcers:        Constitutional    Fever or chills:      PHYSICAL EXAM: Vitals:   12/22/21 0935  BP: 119/71  Pulse: 94  Resp: 18  Temp: 98.5 F (36.9 C)  TempSrc: Temporal  SpO2: 96%  Weight: 215 lb (97.5  kg)  Height: _0  (1.753 m)    GENERAL: The patient is a well-nourished male, in no acute distress. The vital signs are documented above. CARDIAC: There is a regular rate and rhythm.  VASCULAR:  Left groin incision well-healed Left AT and DP palpable Left lateral AT incision  well-healed.  DATA:   None  Assessment/Plan:  66 year old male with complex history documented in the HPI that presents for postop check after most recent left common femoral endarterectomy with profundoplasty bovine patch and a left common femoral to anterior tibial bypass with composite PTFE and great saphenous vein on 11/23/2021.  He has a palpable AT and DP pulse in the left foot.  Bypass graft is patent.  The groin incision and the leg incision are all healing.  I discussed previously I suspect this is his last option given multiple failed revascularizations in the left leg.  Discussed the importance of smoking cessation.  I will see him in 3 months with left leg arterial duplex and ABIs for close surveillance.  I discussed he continue aspirin statin.  We will write him a letter today that he can return to work.   [name], MD [facility] Office: [phone]

## 2021-12-28 ENCOUNTER — Other Ambulatory Visit: Payer: Self-pay

## 2021-12-29 ENCOUNTER — Other Ambulatory Visit: Payer: Self-pay | Admitting: *Deleted

## 2021-12-29 DIAGNOSIS — I739 Peripheral vascular disease, unspecified: Secondary | ICD-10-CM

## 2022-01-20 ENCOUNTER — Other Ambulatory Visit: Payer: Self-pay

## 2022-02-01 ENCOUNTER — Other Ambulatory Visit: Payer: Self-pay

## 2022-02-02 ENCOUNTER — Other Ambulatory Visit: Payer: Self-pay

## 2022-03-04 ENCOUNTER — Other Ambulatory Visit: Payer: Self-pay | Admitting: Family Medicine

## 2022-03-04 ENCOUNTER — Other Ambulatory Visit: Payer: Self-pay

## 2022-03-04 DIAGNOSIS — I25810 Atherosclerosis of coronary artery bypass graft(s) without angina pectoris: Secondary | ICD-10-CM

## 2022-03-04 MED ORDER — ATORVASTATIN CALCIUM 80 MG PO TABS
80.0000 mg | ORAL_TABLET | Freq: Every day | ORAL | 0 refills | Status: DC
  Filled 2022-03-04 – 2022-03-19 (×2): qty 30, 30d supply, fill #0
  Filled 2022-05-13: qty 30, 30d supply, fill #1

## 2022-03-04 NOTE — Telephone Encounter (Signed)
Requested Prescriptions  ?Pending Prescriptions Disp Refills  ?? atorvastatin (LIPITOR) 80 MG tablet 30 tablet 3  ?  Sig: Take 1 tablet (80 mg total) by mouth at bedtime.  ?  ? Cardiovascular:  Antilipid - Statins Failed - 03/04/2022  3:29 PM  ?  ?  Failed - Lipid Panel in normal range within the last 12 months  ?  Cholesterol, Total  ?Date Value Ref Range Status  ?06/27/2020 151 100 - 199 mg/dL Final  ? ?Cholesterol  ?Date Value Ref Range Status  ?11/24/2021 88 0 - 200 mg/dL Final  ? ?LDL Chol Calc (NIH)  ?Date Value Ref Range Status  ?06/27/2020 100 (H) 0 - 99 mg/dL Final  ? ?LDL Cholesterol  ?Date Value Ref Range Status  ?11/24/2021 45 0 - 99 mg/dL Final  ?  Comment:  ?         ?Total Cholesterol/HDL:CHD Risk ?Coronary Heart Disease Risk Table ?                    Men   Women ? 1/2 Average Risk   3.4   3.3 ? Average Risk       5.0   4.4 ? 2 X Average Risk   9.6   7.1 ? 3 X Average Risk  23.4   11.0 ?       ?Use the calculated Patient Ratio ?above and the CHD Risk Table ?to determine the patient's CHD Risk. ?       ?ATP III CLASSIFICATION (LDL): ? <100     mg/dL   Optimal ? 161-096  mg/dL   Near or Above ?                   Optimal ? 130-159  mg/dL   Borderline ? 160-189  mg/dL   High ? >045     mg/dL   Very High ?Performed at [facility], [address], [place], [place] [address] ?  ? ?HDL  ?Date Value Ref Range Status  ?11/24/2021 23 (L) >40 mg/dL Final  ?19/14/7829 27 (L) >39 mg/dL Final  ? ?Triglycerides  ?Date Value Ref Range Status  ?11/24/2021 99 <150 mg/dL Final  ? ?  ?  ?  Passed - Patient is not pregnant  ?  ?  Passed - Valid encounter within last 12 months  ?  Recent Outpatient Visits   ?      ? 5 months ago Uncontrolled type 2 diabetes mellitus with hyperglycemia (HCC)  ? [facility] [facility] [place], Odette Horns, MD  ? 5 months ago Uncontrolled type 2 diabetes mellitus with hyperglycemia (HCC)  ? [place] [facility] [place], Odette Horns, MD  ? 1 year ago Coronary  artery disease involving coronary bypass graft of native heart without angina pectoris  ? [facility] [place], Odette Horns, MD  ? 1 year ago Uncontrolled type 2 diabetes mellitus with hyperglycemia (HCC)  ? [facility], Richard, NP  ? 2 years ago Uncontrolled type 2 diabetes mellitus with hyperglycemia (HCC)  ? [facility] Care at [facility], Meda Coffee, MD  ?  ?  ? ?  ?  ?  ? ? ?

## 2022-03-11 ENCOUNTER — Other Ambulatory Visit: Payer: Self-pay

## 2022-03-19 ENCOUNTER — Other Ambulatory Visit: Payer: Self-pay

## 2022-03-23 ENCOUNTER — Encounter (HOSPITAL_COMMUNITY): Payer: Self-pay

## 2022-03-23 ENCOUNTER — Inpatient Hospital Stay (HOSPITAL_COMMUNITY): Admission: RE | Admit: 2022-03-23 | Payer: Self-pay | Source: Ambulatory Visit

## 2022-03-23 ENCOUNTER — Ambulatory Visit: Payer: Self-pay | Admitting: Vascular Surgery

## 2022-04-08 ENCOUNTER — Other Ambulatory Visit: Payer: Self-pay | Admitting: Family Medicine

## 2022-04-08 ENCOUNTER — Other Ambulatory Visit: Payer: Self-pay

## 2022-04-08 ENCOUNTER — Other Ambulatory Visit: Payer: Self-pay | Admitting: Pharmacist

## 2022-04-08 DIAGNOSIS — E1165 Type 2 diabetes mellitus with hyperglycemia: Secondary | ICD-10-CM

## 2022-04-08 MED ORDER — HUMULIN 70/30 (70-30) 100 UNIT/ML ~~LOC~~ SUSP
SUBCUTANEOUS | 0 refills | Status: DC
  Filled 2022-04-08: qty 20, 33d supply, fill #0

## 2022-04-09 ENCOUNTER — Other Ambulatory Visit: Payer: Self-pay

## 2022-04-22 ENCOUNTER — Other Ambulatory Visit: Payer: Self-pay

## 2022-04-22 ENCOUNTER — Other Ambulatory Visit: Payer: Self-pay | Admitting: Family Medicine

## 2022-04-22 DIAGNOSIS — E1165 Type 2 diabetes mellitus with hyperglycemia: Secondary | ICD-10-CM

## 2022-04-22 DIAGNOSIS — I739 Peripheral vascular disease, unspecified: Secondary | ICD-10-CM

## 2022-04-23 ENCOUNTER — Other Ambulatory Visit: Payer: Self-pay

## 2022-04-23 MED ORDER — POTASSIUM CHLORIDE CRYS ER 20 MEQ PO TBCR
20.0000 meq | EXTENDED_RELEASE_TABLET | Freq: Every day | ORAL | 0 refills | Status: DC
  Filled 2022-04-23 – 2022-05-13 (×2): qty 30, 30d supply, fill #0

## 2022-04-23 MED ORDER — METFORMIN HCL 500 MG PO TABS
500.0000 mg | ORAL_TABLET | Freq: Two times a day (BID) | ORAL | 0 refills | Status: DC
  Filled 2022-04-23 – 2022-05-13 (×2): qty 60, 30d supply, fill #0

## 2022-04-23 MED ORDER — FUROSEMIDE 40 MG PO TABS
40.0000 mg | ORAL_TABLET | Freq: Every day | ORAL | 0 refills | Status: DC
  Filled 2022-04-23 – 2022-05-13 (×2): qty 30, 30d supply, fill #0

## 2022-04-29 ENCOUNTER — Other Ambulatory Visit: Payer: Self-pay

## 2022-04-30 ENCOUNTER — Other Ambulatory Visit: Payer: Self-pay

## 2022-05-13 ENCOUNTER — Other Ambulatory Visit: Payer: Self-pay

## 2022-05-13 ENCOUNTER — Other Ambulatory Visit: Payer: Self-pay | Admitting: Family Medicine

## 2022-05-13 DIAGNOSIS — I25810 Atherosclerosis of coronary artery bypass graft(s) without angina pectoris: Secondary | ICD-10-CM

## 2022-05-13 DIAGNOSIS — E1165 Type 2 diabetes mellitus with hyperglycemia: Secondary | ICD-10-CM

## 2022-05-13 NOTE — Telephone Encounter (Signed)
Requested medication (s) are due for refill today: yes  Requested medication (s) are on the active medication list: yes  Last refill:  08/31/21 and12/5/22  Future visit scheduled: no  Notes to clinic:  Unable to refill per protocol, appointment needed.      Requested Prescriptions  Pending Prescriptions Disp Refills   carvedilol (COREG) 6.25 MG tablet 60 tablet 5    Sig: Take 1 tablet (6.25 mg total) by mouth 2 (two) times daily with a meal.     Cardiovascular: Beta Blockers 3 Failed - 05/13/2022  8:51 AM      Failed - Cr in normal range and within 360 days    Creat  Date Value Ref Range Status  08/14/2014 0.81 0.50 - 1.35 mg/dL Final   Creatinine, Ser  Date Value Ref Range Status  11/26/2021 1.29 (H) 0.61 - 1.24 mg/dL Final         Failed - AST in normal range and within 360 days    AST  Date Value Ref Range Status  11/23/2021 13 (L) 15 - 41 U/L Final         Failed - Valid encounter within last 6 months    Recent Outpatient Visits           7 months ago Uncontrolled type 2 diabetes mellitus with hyperglycemia (HCC)   [facility] [place], [place], MD   7 months ago Uncontrolled type 2 diabetes mellitus with hyperglycemia (HCC)   [facility] [place], [place], MD   1 year ago Coronary artery disease involving coronary bypass graft of native heart without angina pectoris   [facility] [place], [place], MD   1 year ago Uncontrolled type 2 diabetes mellitus with hyperglycemia (HCC)   [facility], Richard, NP   2 years ago Uncontrolled type 2 diabetes mellitus with hyperglycemia [facility])   [facility] Care at [facility], Meda Coffee, MD              Passed - ALT in normal range and within 360 days    ALT  Date Value Ref Range Status  11/23/2021 18 0 - 44 U/L Final         Passed - Last BP in normal range    BP Readings from Last 1 Encounters:  12/22/21 119/71          Passed - Last Heart Rate in normal range    Pulse Readings from Last 1 Encounters:  12/22/21 94          Dulaglutide (TRULICITY) 0.75 MG/0.5ML SOPN 2 mL 3    Sig: Inject 0.75 mg into the skin once a week.     Endocrinology:  Diabetes - GLP-1 Receptor Agonists Failed - 05/13/2022  8:51 AM      Failed - HBA1C is between 0 and 7.9 and within 180 days    Hgb A1c MFr Bld  Date Value Ref Range Status  11/23/2021 8.1 (H) 4.8 - 5.6 % Final    Comment:    (NOTE)         Prediabetes: 5.7 - 6.4         Diabetes: >6.4         Glycemic control for adults with diabetes: <7.0          Failed - Valid encounter within last 6 months    Recent Outpatient Visits  7 months ago Uncontrolled type 2 diabetes mellitus with hyperglycemia (HCC)   [facility] [place], [place], MD   7 months ago Uncontrolled type 2 diabetes mellitus with hyperglycemia [facility])   [facility] [place], Odette Horns, MD   1 year ago Coronary artery disease involving coronary bypass graft of native heart without angina pectoris   [facility] [place], Odette Horns, MD   1 year ago Uncontrolled type 2 diabetes mellitus with hyperglycemia [facility])   [facility], Ger[place], NP   2 years ago Uncontrolled type 2 diabetes mellitus with hyperglycemia [facility])   Primary Care at [facility], Meda Coffee, MD               insulin NPH-regular Human (HUMULIN 70/30) (70-30) 100 UNIT/ML injection 20 mL 0    Sig: Inject 30 units into the skin twice daily (with breakfast and dinner).     Endocrinology:  Diabetes - Insulins Failed - 05/13/2022  8:51 AM      Failed - HBA1C is between 0 and 7.9 and within 180 days    Hgb A1c MFr Bld  Date Value Ref Range Status  11/23/2021 8.1 (H) 4.8 - 5.6 % Final    Comment:    (NOTE)         Prediabetes: 5.7 - 6.4         Diabetes: >6.4         Glycemic control for adults with diabetes:  <7.0          Failed - Valid encounter within last 6 months    Recent Outpatient Visits           7 months ago Uncontrolled type 2 diabetes mellitus with hyperglycemia (HCC)   [facility] [place], [place], MD   7 months ago Uncontrolled type 2 diabetes mellitus with hyperglycemia (HCC)   [facility] [place], [place], MD   1 year ago Coronary artery disease involving coronary bypass graft of native heart without angina pectoris   [place] [place], [place], MD   1 year ago Uncontrolled type 2 diabetes mellitus with hyperglycemia [facility])   [facility], [place], NP   2 years ago Uncontrolled type 2 diabetes mellitus with hyperglycemia [facility])   Primary Care at [facility], Meda Coffee, MD

## 2022-05-18 ENCOUNTER — Ambulatory Visit: Payer: Self-pay | Admitting: *Deleted

## 2022-05-18 NOTE — Telephone Encounter (Signed)
Chief Complaint: Insulin refill Symptoms: Blood sugar running in 200's at times 300's Frequency: Ran out 2-3 days ago Pertinent Negatives: Patient denies symptoms Disposition: [] ED /[] Urgent Care (no appt availability in office) / [x] Appointment(In office/virtual)/ []  [facility]/ [] Home Care/ [x] Refused Recommended Disposition /[] [facility]/ []  Follow-up with PCP Additional Notes: Patient advised per last OV note to return in 3 months, appointment cancelled. 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 He says the insulin is what keeps it down. He says he has no one to bring him to the office. I offered to schedule him tomorrow at [facility] with provider, no transportation tomorrow, advised of the location in [place] on Thursday, he says he is not sure he could even get there, he can barely walk to the mailbox due to his leg is bad. I advised I will send this to Dr. [place]ford and someone will call with her recommendation on refilling the insulin for him. He says he really appreciates her and would like the refill. He doesn't understand why it will not be refilled, advised the need for office f/u visits for chronic conditions, he verbalized understanding and says no transportation is his problem.   Summary: pt out of insulin till appt no immediate appt available   Pls fu with this pt he is out of all his insulin and Dr [facility] will not fill he says till he has an appt. Heart issues and no insulin at all, no appt available till August. Pt confused why he cannot get some insulin enough to get to an appt. Pt wants to talk to a nurse. FU at [phone]      Reason for Disposition  [1] Caller has NON-URGENT medicine question about med that PCP prescribed  AND [2] triager unable to answer question  Answer Assessment - Initial Assessment Questions 1. DRUG NAME: "What medicine do you need to have refilled?"     Insulin 2. REFILLS REMAINING: "How many refills are remaining?" (Note: The label on the medicine or pill bottle will show how many refills are remaining. If there are no refills remaining, then a renewal may be needed.)     N/A 3. EXPIRATION DATE: "What is the expiration date?" (Note: The label states when the prescription will expire, and thus can no longer be refilled.)     N/A 4. PRESCRIBING HCP: "Who prescribed it?" Reason: If prescribed by specialist, call should be referred to that group.     Dr. [place] 5. SYMPTOMS: "Do you have any symptoms?"     N/A 6. PREGNANCY: "Is there any chance that you are pregnant?" "When was your last menstrual period?"     N/A  Protocols used: Medication Refill and Renewal Call-A-AH

## 2022-05-18 NOTE — Telephone Encounter (Addendum)
Summary: Robert Sanchez out of insulin till appt no immediate appt available   Pls fu with this Robert Sanchez he is out of all his insulin and Dr Alvis Lemmings will not fill he says till he has an appt. Heart issues and no insulin at all, no appt available till August. Robert Sanchez confused why he cannot get some insulin enough to get to an appt. Robert Sanchez wants to talk to a nurse. FU at [phone]     Attempted to call patient regarding his medication- due f/u in March- past due- may can schedule with mobile provider if she still has openings tomorrow. Left message to call office back

## 2022-05-21 ENCOUNTER — Other Ambulatory Visit: Payer: Self-pay | Admitting: Family Medicine

## 2022-05-21 ENCOUNTER — Other Ambulatory Visit: Payer: Self-pay

## 2022-05-21 DIAGNOSIS — E1165 Type 2 diabetes mellitus with hyperglycemia: Secondary | ICD-10-CM

## 2022-05-21 MED ORDER — HUMULIN 70/30 (70-30) 100 UNIT/ML ~~LOC~~ SUSP
SUBCUTANEOUS | 0 refills | Status: DC
  Filled 2022-05-21: qty 20, 33d supply, fill #0

## 2022-05-21 NOTE — Telephone Encounter (Signed)
This encounter was created in error - please disregard.

## 2022-05-21 NOTE — Telephone Encounter (Signed)
Medication Refill - Medication: Rx #: [id number]  insulin NPH-regular Human (HUMULIN 70/30) (70-30) 100 UNIT/ML injection [[id number]]   Has the patient contacted their pharmacy? Yes.   (Agent: If no, request that the patient contact the pharmacy for the refill. If patient does not wish to contact the pharmacy document the reason why and proceed with request.) (Agent: If yes, when and what did the pharmacy advise?)  Preferred Pharmacy (with phone number or street name):  [facility] [place] [facility]  [address] [place] [address]  Phone: [phone] Fax: [phone]  Hours: M-F 7:30a-6:00p   Has the patient been seen for an appointment in the last year OR does the patient have an upcoming appointment? Yes.    Agent: Please be advised that RX refills may take up to 3 business days. We ask that you follow-up with your pharmacy.

## 2022-05-21 NOTE — Telephone Encounter (Signed)
Please see triage notes 05/18/22. Pt called again for insulin. Second request.   Last RF 04/08/22 20 ml. - pt stated he is out of insulin.    Requested Prescriptions  Pending Prescriptions Disp Refills   insulin NPH-regular Human (HUMULIN 70/30) (70-30) 100 UNIT/ML injection 20 mL 0    Sig: Inject 30 units into the skin twice daily (with breakfast and dinner).     Endocrinology:  Diabetes - Insulins Failed - 05/21/2022 12:04 PM      Failed - HBA1C is between 0 and 7.9 and within 180 days    Hgb A1c MFr Bld  Date Value Ref Range Status  11/23/2021 8.1 (H) 4.8 - 5.6 % Final    Comment:    (NOTE)         Prediabetes: 5.7 - 6.4         Diabetes: >6.4         Glycemic control for adults with diabetes: <7.0          Failed - Valid encounter within last 6 months    Recent Outpatient Visits           7 months ago Uncontrolled type 2 diabetes mellitus with hyperglycemia (HCC)   [facility] [place], [place], MD   8 months ago Uncontrolled type 2 diabetes mellitus with hyperglycemia (HCC)   [facility] [place], [place], MD   1 year ago Coronary artery disease involving coronary bypass graft of native heart without angina pectoris   [facility] [place], [place], MD   1 year ago Uncontrolled type 2 diabetes mellitus with hyperglycemia [facility])   [facility], Richard, NP   2 years ago Uncontrolled type 2 diabetes mellitus with hyperglycemia [facility])   [facility] at [facility], Meda [place], MD

## 2022-05-21 NOTE — Telephone Encounter (Signed)
Pt was called and informed of medication being refilled. Pt has been scheduled for an appointment.

## 2022-05-27 ENCOUNTER — Other Ambulatory Visit: Payer: Self-pay

## 2022-06-01 ENCOUNTER — Encounter: Payer: Self-pay | Admitting: Family Medicine

## 2022-06-01 ENCOUNTER — Ambulatory Visit: Payer: Self-pay | Attending: Family Medicine | Admitting: Family Medicine

## 2022-06-01 ENCOUNTER — Other Ambulatory Visit: Payer: Self-pay

## 2022-06-01 VITALS — BP 119/70 | HR 76 | Temp 97.7°F | Wt 231.0 lb

## 2022-06-01 DIAGNOSIS — E1149 Type 2 diabetes mellitus with other diabetic neurological complication: Secondary | ICD-10-CM

## 2022-06-01 DIAGNOSIS — F17210 Nicotine dependence, cigarettes, uncomplicated: Secondary | ICD-10-CM

## 2022-06-01 DIAGNOSIS — I739 Peripheral vascular disease, unspecified: Secondary | ICD-10-CM

## 2022-06-01 DIAGNOSIS — Z1211 Encounter for screening for malignant neoplasm of colon: Secondary | ICD-10-CM

## 2022-06-01 DIAGNOSIS — I25810 Atherosclerosis of coronary artery bypass graft(s) without angina pectoris: Secondary | ICD-10-CM

## 2022-06-01 DIAGNOSIS — E1165 Type 2 diabetes mellitus with hyperglycemia: Secondary | ICD-10-CM

## 2022-06-01 DIAGNOSIS — Z23 Encounter for immunization: Secondary | ICD-10-CM

## 2022-06-01 LAB — POCT GLYCOSYLATED HEMOGLOBIN (HGB A1C): HbA1c, POC (controlled diabetic range): 9.4 % — AB (ref 0.0–7.0)

## 2022-06-01 LAB — GLUCOSE, POCT (MANUAL RESULT ENTRY): POC Glucose: 290 mg/dl — AB (ref 70–99)

## 2022-06-01 MED ORDER — METFORMIN HCL 500 MG PO TABS
1000.0000 mg | ORAL_TABLET | Freq: Two times a day (BID) | ORAL | 6 refills | Status: DC
  Filled 2022-06-01: qty 120, 30d supply, fill #0
  Filled 2022-07-08: qty 360, 90d supply, fill #0
  Filled 2022-11-19: qty 120, 30d supply, fill #1
  Filled 2022-12-31: qty 120, 30d supply, fill #2
  Filled 2023-02-02: qty 120, 30d supply, fill #3
  Filled 2023-03-24: qty 120, 30d supply, fill #4

## 2022-06-01 MED ORDER — TRULICITY 1.5 MG/0.5ML ~~LOC~~ SOAJ
1.5000 mg | SUBCUTANEOUS | 6 refills | Status: DC
  Filled 2022-06-01 – 2022-07-08 (×2): qty 2, 28d supply, fill #0
  Filled 2022-08-27: qty 2, 28d supply, fill #1

## 2022-06-01 MED ORDER — GABAPENTIN 300 MG PO CAPS
300.0000 mg | ORAL_CAPSULE | Freq: Every day | ORAL | 6 refills | Status: DC
  Filled 2022-06-01: qty 30, 30d supply, fill #0

## 2022-06-01 MED ORDER — FUROSEMIDE 40 MG PO TABS
40.0000 mg | ORAL_TABLET | Freq: Every day | ORAL | 6 refills | Status: DC
  Filled 2022-06-01: qty 30, 30d supply, fill #0
  Filled 2022-07-08: qty 90, 90d supply, fill #0
  Filled 2022-12-31: qty 30, 30d supply, fill #1
  Filled 2023-02-02: qty 30, 30d supply, fill #2
  Filled 2023-03-24: qty 30, 30d supply, fill #3
  Filled 2023-05-11: qty 30, 30d supply, fill #4

## 2022-06-01 MED ORDER — TRULICITY 0.75 MG/0.5ML ~~LOC~~ SOAJ
0.7500 mg | SUBCUTANEOUS | 0 refills | Status: DC
  Filled 2022-06-01: qty 2, 28d supply, fill #0

## 2022-06-01 MED ORDER — ATORVASTATIN CALCIUM 80 MG PO TABS
80.0000 mg | ORAL_TABLET | Freq: Every day | ORAL | 6 refills | Status: DC
  Filled 2022-06-01 – 2022-07-08 (×2): qty 30, 30d supply, fill #0
  Filled 2022-08-27: qty 30, 30d supply, fill #1
  Filled 2022-09-30: qty 30, 30d supply, fill #2
  Filled 2022-11-19: qty 30, 30d supply, fill #3
  Filled 2023-02-02: qty 30, 30d supply, fill #4
  Filled 2023-03-24: qty 30, 30d supply, fill #5
  Filled 2023-05-11: qty 30, 30d supply, fill #6

## 2022-06-01 MED ORDER — CARVEDILOL 6.25 MG PO TABS
6.2500 mg | ORAL_TABLET | Freq: Two times a day (BID) | ORAL | 5 refills | Status: DC
  Filled 2022-06-01: qty 60, 30d supply, fill #0
  Filled 2022-07-08: qty 60, 30d supply, fill #1
  Filled 2022-08-27: qty 60, 30d supply, fill #2
  Filled 2022-10-22: qty 60, 30d supply, fill #3
  Filled 2022-12-31: qty 60, 30d supply, fill #4
  Filled 2023-02-02: qty 60, 30d supply, fill #5

## 2022-06-01 MED ORDER — POTASSIUM CHLORIDE CRYS ER 20 MEQ PO TBCR
20.0000 meq | EXTENDED_RELEASE_TABLET | Freq: Every day | ORAL | 6 refills | Status: DC
  Filled 2022-06-01 – 2022-07-08 (×2): qty 30, 30d supply, fill #0
  Filled 2022-08-27: qty 30, 30d supply, fill #1
  Filled 2022-10-22: qty 30, 30d supply, fill #2
  Filled 2022-11-19: qty 30, 30d supply, fill #3

## 2022-06-01 MED ORDER — HUMULIN 70/30 (70-30) 100 UNIT/ML ~~LOC~~ SUSP
SUBCUTANEOUS | 6 refills | Status: DC
  Filled 2022-06-01: qty 30, fill #0

## 2022-06-01 NOTE — Progress Notes (Signed)
Subjective:  Patient ID: Robert Sanchez, male    DOB: 1956-09-15  Age: 66 y.o. MRN: [id number]  CC: Diabetes   HPI Robert Sanchez is a 66 y.o. year old male with a history of Type 2 DM (A1c 9.4) , CAD s/p CABG x3, PVD status post thrombectomy of left common femoral to PT composite bypass, with retrograde iliac angioplasty and revision of distal bypass to below the knee popliteal artery/TP trunk with a bovine pericardial patch angioplasty, subsequent thrombosis and angioplasty of distal anastomosis, partial L big toe amputation in 07/2020.  In 11/2021 he did undergo left femoral endarterectomy with profundoplasty bovine patch and a left common femoral to anterior tibial bypass with composite PTFE and great saphenous vein.  Interval History: A1c is 9.4 down from 13.3 previously and Trulicity was initiated at his last visit. He has been out of some of his medications and is unsure for how long. Also states he has been out of Trulicity for a while.  His last visit with Dr. Carlis Abbott of vascular was in 12/2021.  He continues to experience burning in his left leg where he underwent his procedure.  He continues to smoke a Cigarettes every now and then but smoked greater than that for 45 years  Past Medical History:  Diagnosis Date   Acute pulmonary edema ([place]) 06/05/2018   Arthritis    COPD (chronic obstructive pulmonary disease) ([place])    " beginning stages " per pt   Coronary artery disease    Diabetes mellitus without complication ([place])    type 2   Diabetic neuropathy (HCC)    Diabetic neuropathy ([place])    Emphysema/COPD ([place])    " beginning stages"   GERD (gastroesophageal reflux disease)    HLD (hyperlipidemia)    HOH (hard of hearing)    no hearing aids   Myocardial infarction [facility])    Peripheral vascular disease ([place])     Past Surgical History:  Procedure Laterality Date   ABDOMINAL AORTOGRAM W/LOWER EXTREMITY N/A 06/21/2018   Procedure: ABDOMINAL AORTOGRAM W/LOWER EXTREMITY;   Surgeon: [name], MD;  Location: [place] [facility];  Service: Cardiovascular;  Laterality: N/A;   ABDOMINAL AORTOGRAM W/LOWER EXTREMITY Left 01/16/2020   Procedure: ABDOMINAL AORTOGRAM W/LOWER EXTREMITY;  Surgeon: [name], MD;  Location: [place] [facility];  Service: Cardiovascular;  Laterality: Left;   ABDOMINAL AORTOGRAM W/LOWER EXTREMITY N/A 07/09/2020   Procedure: ABDOMINAL AORTOGRAM W/LOWER EXTREMITY;  Surgeon: [name], MD;  Location: [place] [facility];  Service: Cardiovascular;  Laterality: N/A;  TPA infusion   ABDOMINAL AORTOGRAM W/LOWER EXTREMITY Bilateral 11/20/2021   Procedure: ABDOMINAL AORTOGRAM W/LOWER EXTREMITY;  Surgeon: Cherre Robins, MD;  Location: [place] [facility];  Service: Cardiovascular;  Laterality: Bilateral;   AMPUTATION Left 08/04/2020   Procedure: LEFT PARTIAL GREAT TOE AMPUTATION;  Surgeon: [name], MD;  Location: [place];  Service: Vascular;  Laterality: Left;   ANGIOPLASTY Left 11/23/2021   Procedure: ANGIOPLASTY with BOVINE PATCH 1cmx6cm;  Surgeon: [name], MD;  Location: [place];  Service: Vascular;  Laterality: Left;   APPLICATION OF WOUND VAC Left 08/04/2020   Procedure: LEFT GROIN DEBRIDEMENT WITH APPLICATION OF WOUND [place];  Surgeon: [name], MD;  Location: [place];  Service: Vascular;  Laterality: Left;   CORONARY ARTERY BYPASS GRAFT N/A 06/12/2018   Procedure: CORONARY ARTERY BYPASS GRAFTING (CABG) x 3 WITH ENDOSCOPIC HARVESTING OF RIGHT GREATER SAPHENOUS VEIN. LIMA TO LAD. SVG TO PD.  SVG TO DIAGONAL.;  Surgeon: Ivin Poot, MD;  Location: [place];  Service: Open Heart Surgery;  Laterality: N/A;   ENDARTERECTOMY FEMORAL Left 11/23/2021   Procedure: ENDARTERECTOMY FEMORAL;  Surgeon: [name], MD;  Location: [place];  Service: Vascular;  Laterality: Left;   FEMORAL-POPLITEAL BYPASS GRAFT Left 07/10/2020   Procedure: REDO EXPOSURE OF LEFT COMMON FEMORAL ARTERY LEFT COMMON FEMORAL TO  POSTERIOR TIBIAL COMPOSITE BYPASS GRAFT;  Surgeon: [name], MD;  Location: [place];  Service: Vascular;  Laterality: Left;   FEMORAL-TIBIAL BYPASS GRAFT Left 08/21/2018   Procedure: BYPASS GRAFT LEFT FEMORAL TO POSTERIOR TIBIAL ARTERY USING LEFT REVERSED GREAT SAPHENOUS VEIN;  Surgeon: [name], MD;  Location: [place];  Service: Vascular;  Laterality: Left;   FEMORAL-TIBIAL BYPASS GRAFT Left 08/04/2020   Procedure: REVISION FEMORAL-DISTAL BYPASS WITH BIOLOGICAL BOVINE PATCH;  Surgeon: [name], MD;  Location: [place];  Service: Vascular;  Laterality: Left;   FEMORAL-TIBIAL BYPASS GRAFT Left 11/23/2021   Procedure: REDO LEFT FEMORAL-TIBIAL ARTERY BYPASS GRAFT USING PROPATEN 80m VASCULAR GRAFT REMOVABLE [place]  Surgeon: CMarty Heck MD;  [place]  Service: Vascular;  Laterality: Left;  Insert arterial line   KNEE ARTHROSCOPY     LOWER EXTREMITY ANGIOGRAM Left 08/04/2020   Procedure: LEFT ILIAC ANGIOGRAM, ANGIOPLASTY OF LEFT ILIAC STENT WITH DRUG COATED BALLOON;  Surgeon: CMarty Heck MD;  [place]  Service: Vascular;  Laterality: Left;   LOWER EXTREMITY ANGIOGRAPHY Left 01/17/2020   Procedure: Lower Extremity Angiography;  Surgeon: CMarty Heck MD;  [place] [facility];  Service: Cardiovascular;  Laterality: Left;   LOWER EXTREMITY ANGIOGRAPHY N/A 01/07/2021   Procedure: LOWER EXTREMITY ANGIOGRAPHY;  Surgeon: HCherre Robins MD;  [place] [facility];  Service: Cardiovascular;  Laterality: N/A;   PERIPHERAL VASCULAR BALLOON ANGIOPLASTY Left 01/17/2020   Procedure: PERIPHERAL VASCULAR BALLOON ANGIOPLASTY;  Surgeon: CMarty Heck MD;  [place] [facility];  Service: Cardiovascular;  Laterality: Left;  pt   PERIPHERAL VASCULAR INTERVENTION Left 06/21/2018   Procedure: PERIPHERAL VASCULAR INTERVENTION;  Surgeon: CMarty Heck MD;  [facility] [place] [facility];  Service: Cardiovascular;  Laterality:  Left;   PERIPHERAL VASCULAR INTERVENTION Left 01/08/2021   Procedure: PERIPHERAL VASCULAR INTERVENTION;  Surgeon: CMarty Heck MD;  [place] [facility];  Service: Cardiovascular;  Laterality: Left;   PERIPHERAL VASCULAR THROMBECTOMY Left 01/16/2020   Procedure: PERIPHERAL VASCULAR THROMBECTOMY;  Surgeon: CMarty Heck MD;  [facility];  Service: Cardiovascular;  Laterality: Left;  Lytic Catheter Placement left fem-pop bypass   PERIPHERAL VASCULAR THROMBECTOMY Left 01/17/2020   Procedure: PERIPHERAL VASCULAR THROMBECTOMY;  Surgeon: CMarty Heck MD;  [place] [facility];  Service: Cardiovascular;  Laterality: Left;  fem-pt bypass   PERIPHERAL VASCULAR THROMBECTOMY Left 07/10/2020   Procedure: lysis recheck;  Surgeon: CMarty Heck MD;  [place] [facility];  Service: Cardiovascular;  Laterality: Left;   PULMONARY THROMBECTOMY N/A 01/08/2021   Procedure: LYSIS RECHECK;  Surgeon: CMarty Heck MD;  [place] [place];  Service: Cardiovascular;  Laterality: N/A;   RIGHT/LEFT HEART CATH AND CORONARY ANGIOGRAPHY N/A 06/05/2018   Procedure: RIGHT/LEFT HEART CATH AND CORONARY ANGIOGRAPHY;  Surgeon: KDixie Dials MD;  [place];  Service: Cardiovascular;  Laterality: N/A;   SPINE SURGERY     TEE WITHOUT CARDIOVERSION N/A 06/12/2018   Procedure: TRANSESOPHAGEAL ECHOCARDIOGRAM (TEE);  Surgeon: VLucianne Lei  Donney Rankins, MD;  Location: [place];  Service: Open Heart Surgery;  Laterality: N/A;   teeth extractions     THROMBECTOMY FEMORAL ARTERY Left 08/04/2020   Procedure: THROMBECTOMY OF LEFT FEMORAL TP COMPOSTITE BYPASS;  Surgeon: [name], MD;  Location: [place];  Service: Vascular;  Laterality: Left;   THROMBECTOMY ILIAC ARTERY Left 07/10/2020   Procedure: THROMBECTOMY OF LEFT EXTERNAL ILIAC AND LEFT COMMON FEMORAL AND LEFT POSTERIOR TIBIAL ARTERIES;  Surgeon: [name], MD;  Location: [place];   Service: Vascular;  Laterality: Left;   ULTRASOUND GUIDANCE FOR VASCULAR ACCESS Bilateral 11/23/2021   Procedure: ULTRASOUND GUIDANCE FOR VASCULAR ACCESS;  Surgeon: [name], MD;  Location: [place];  Service: Vascular;  Laterality: Bilateral;   VEIN HARVEST Left 08/21/2018   Procedure: VEIN HARVEST LEFT GREAT SAPHENOUS VEIN;  Surgeon: [name], MD;  Location: [place];  Service: Vascular;  Laterality: Left;   VEIN HARVEST Right 11/23/2021   Procedure: VEIN HARVEST;  Surgeon: [name], MD;  Location: [place];  Service: Vascular;  Laterality: Right;    Family History  Problem Relation Age of Onset   Diabetes Mother    Lung disease Mother    Cancer Father    Heart disease Father     Social History   Socioeconomic History   Marital status: Single    Spouse name: Not on file   Number of children: 1   Years of education: Not on file   Highest education level: 10th grade  Occupational History   Not on file  Tobacco Use   Smoking status: Some Days    Packs/day: 1.00    Years: 50.00    Total pack years: 50.00    Types: Cigarettes   Smokeless tobacco: Never   Tobacco comments:    1PPD x 50 years. 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   Vaping Use   Vaping Use: Never used  Substance and Sexual Activity   Alcohol use: Yes    Comment: occasional   Drug use: Yes    Types: Marijuana    Comment: Smokes Marijuana once a week   Sexual activity: Not on file  Other Topics Concern   Not on file  Social History Narrative   Not on file   Social Determinants of Health   Financial Resource Strain: Low Risk  (08/31/2021)   Overall Financial Resource Strain (CARDIA)    Difficulty of Paying Living Expenses: Not very hard  Food Insecurity: No Food Insecurity (08/31/2021)   Hunger Vital Sign    Worried About Running Out of Food in the Last Year: Never true    Ran Out of Food in the Last Year: Never true  Transportation Needs: No Transportation Needs  (08/31/2021)   PRAPARE - Hydrologist (Medical): No    Lack of Transportation (Non-Medical): No  Physical Activity: Not on file  Stress: Not on file  Social Connections: Not on file    Allergies  Allergen Reactions   Lisinopril Other (See Comments)    Syncope   Codeine Rash    Outpatient Medications Prior to Visit  Medication Sig Dispense Refill   aspirin 81 MG EC tablet Take 1 tablet (81 mg total) by mouth daily. Swallow whole.     Blood Glucose Monitoring Suppl (ONETOUCH VERIO) w/Device KIT Use to test blood sugars up to 4 times daily as directed. 1 kit 0   glucose blood (ONETOUCH VERIO) test strip Use to  test blood sugars up to 4 times daily as directed. 100 each 2   Insulin Syringe-Needle U-100 30G X 5/16" 1 ML MISC Use to inject insulin twice daily. 100 each 3   Lancets (ONETOUCH DELICA PLUS [id number]) MISC Use to test blood sugars up to 4 times daily as directed. 100 each 2   losartan (COZAAR) 25 MG tablet Take 0.5 tablets (12.5 mg total) by mouth at bedtime. 45 tablet 3   Multiple Vitamin (MULTIVITAMIN) tablet Take 1 tablet by mouth daily.     atorvastatin (LIPITOR) 80 MG tablet Take 1 tablet (80 mg total) by mouth at bedtime. 90 tablet 0   carvedilol (COREG) 6.25 MG tablet Take 1 tablet (6.25 mg total) by mouth 2 (two) times daily with a meal. 60 tablet 5   Dulaglutide (TRULICITY) 6.07 PX/1.0GY SOPN Inject 0.75 mg into the skin once a week. (Patient taking differently: Inject 0.75 mg into the skin every Wednesday.) 2 mL 3   furosemide (LASIX) 40 MG tablet Take 1 tablet (40 mg total) by mouth daily. 30 tablet 0   insulin NPH-regular Human (HUMULIN 70/30) (70-30) 100 UNIT/ML injection Inject 30 units into the skin twice daily (with breakfast and dinner). make an appointment for refills. 20 mL 0   metFORMIN (GLUCOPHAGE) 500 MG tablet Take 1 tablet (500 mg total) by mouth 2 (two) times daily with a meal. 60 tablet 0   potassium chloride SA (KLOR-CON M)  20 MEQ tablet Take 1 tablet (20 mEq total) by mouth daily. 30 tablet 0   Insulin Pen Needle 32G X 4 MM MISC Use as directed with insulin pen (Patient not taking: Reported on 06/01/2022) 200 each 0   oxyCODONE-acetaminophen (PERCOCET/ROXICET) 5-325 MG tablet Take 1 tablet by mouth every 6 (six) hours as needed for moderate pain. (Patient not taking: Reported on 06/01/2022) 30 tablet 0   rivaroxaban (XARELTO) 20 MG TABS tablet Take 1 tablet (20 mg total) by mouth daily with supper. (Patient not taking: Reported on 06/01/2022) 30 tablet 5   spironolactone (ALDACTONE) 25 MG tablet Take 0.5 tablets (12.5 mg total) by mouth daily. 45 tablet 3   No facility-administered medications prior to visit.     ROS Review of Systems  Constitutional:  Negative for activity change and appetite change.  HENT:  Negative for sinus pressure and sore throat.   Respiratory:  Negative for chest tightness, shortness of breath and wheezing.   Cardiovascular:  Negative for chest pain and palpitations.  Gastrointestinal:  Negative for abdominal distention, abdominal pain and constipation.  Genitourinary: Negative.   Musculoskeletal:        Se HPI  Neurological:  Positive for numbness.  Psychiatric/Behavioral:  Negative for behavioral problems and dysphoric mood.     Objective:  BP 119/70   Pulse 76   Temp 97.7 F (36.5 C)   Wt 231 lb (104.8 kg)   SpO2 95%   BMI 34.11 kg/m      06/01/2022    9:19 AM 12/22/2021    9:35 AM 11/26/2021    8:42 AM  BP/Weight  Systolic BP [phone]  Diastolic BP 70 71 75  Wt. (Lbs) 231 215   BMI 34.11 kg/m2 31.75 kg/m2       Physical Exam Constitutional:      Appearance: He is well-developed.  Cardiovascular:     Rate and Rhythm: Normal rate.     Heart sounds: Normal heart sounds. No murmur heard. Pulmonary:     Effort: Pulmonary effort is  normal.     Breath sounds: Normal breath sounds. No wheezing or rales.  Chest:     Chest wall: No tenderness.  Abdominal:      General: Bowel sounds are normal. There is no distension.     Palpations: Abdomen is soft. There is no mass.     Tenderness: There is no abdominal tenderness.  Musculoskeletal:        General: Normal range of motion.     Right lower leg: No edema.     Left lower leg: No edema.  Neurological:     Mental Status: He is alert and oriented to person, place, and time.  Psychiatric:        Mood and Affect: Mood normal.        Latest Ref Rng & Units 11/26/2021    2:21 AM 11/25/2021    1:11 AM 11/24/2021    2:19 AM  CMP  Glucose 70 - 99 mg/dL 106  106  239   BUN 8 - 23 mg/dL '16  18  21   ' Creatinine 0.61 - 1.24 mg/dL 1.29  1.36  1.46   Sodium 135 - 145 mmol/L 138  135  138   Potassium 3.5 - 5.1 mmol/L 4.5  4.1  4.6   Chloride 98 - 111 mmol/L 98  106  101   CO2 22 - 32 mmol/L '28  22  25   ' Calcium 8.9 - 10.3 mg/dL 9.1  8.2  8.5     Lipid Panel     Component Value Date/Time   CHOL 88 11/24/2021 0219   CHOL 151 06/27/2020 1216   TRIG 99 11/24/2021 0219   HDL 23 (L) 11/24/2021 0219   HDL 27 (L) 06/27/2020 1216   CHOLHDL 3.8 11/24/2021 0219   VLDL 20 11/24/2021 0219   LDLCALC 45 11/24/2021 0219   LDLCALC 100 (H) 06/27/2020 1216    CBC    Component Value Date/Time   WBC 12.3 (H) 11/26/2021 0221   RBC 3.50 (L) 11/26/2021 0221   HGB 9.8 (L) 11/26/2021 0221   HCT 31.2 (L) 11/26/2021 0221   PLT 276 11/26/2021 0221   MCV 89.1 11/26/2021 0221   MCH 28.0 11/26/2021 0221   MCHC 31.4 11/26/2021 0221   RDW 14.6 11/26/2021 0221   LYMPHSABS 2.4 08/27/2021 2316   MONOABS 1.1 (H) 08/27/2021 2316   EOSABS 0.2 08/27/2021 2316   BASOSABS 0.1 08/27/2021 2316    Lab Results  Component Value Date   HGBA1C 9.4 (A) 06/01/2022    Assessment & Plan:  1. Uncontrolled type 2 diabetes mellitus with hyperglycemia (HCC) A1c of 9.4 which is above goal of less than 7.0 Medication nonadherence contributing Increase metformin dose from 500 twice daily to 1000 mg twice daily We will restart  Trulicity and will uptitrate at his clinical pharmacy visit Counseled on Diabetic diet, my plate method, 242 minutes of moderate intensity exercise/week Blood sugar logs with fasting goals of 80-120 mg/dl, random of less than 180 and in the event of sugars less than 60 mg/dl or greater than 400 mg/dl encouraged to notify the clinic. Advised on the need for annual eye exams, annual foot exams, Pneumonia vaccine. - POCT glycosylated hemoglobin (Hb A1C) - POCT glucose (manual entry) - Microalbumin/Creatinine Ratio, Urine - metFORMIN (GLUCOPHAGE) 500 MG tablet; Take 2 tablets (1,000 mg total) by mouth 2 (two) times daily with a meal.  Dispense: 120 tablet; Refill: 6 - CMP14+EGFR - Dulaglutide (TRULICITY) 3.53 IR/4.4RX SOPN; Inject 0.75 mg into  the skin once a week. For 4 weeks then increase to 1.5 mg weekly  Dispense: 2 mL; Refill: 0 - insulin NPH-regular Human (HUMULIN 70/30) (70-30) 100 UNIT/ML injection; Inject 30 units into the skin twice daily (with breakfast and dinner)  Dispense: 30 mL; Refill: 6  2. 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  Dispense: 30 capsule; Refill: 6  3. Screening for colon cancer - Fecal occult blood, imunochemical  4. Smoking greater than 20 pack years Spent 3 minutes counseling on smoking cessation and he is not ready to work on [employer] he does not smoke much - CT CHEST LUNG CANCER SCREENING LOW DOSE WO CONTRAST; Future  5. Coronary artery disease involving coronary bypass graft of native heart without angina pectoris Asymptomatic Risk factor modification - atorvastatin (LIPITOR) 80 MG tablet; Take 1 tablet (80 mg total) by mouth at bedtime.  Dispense: 30 tablet; Refill: 6 - carvedilol (COREG) 6.25 MG tablet; Take 1 tablet (6.25 mg total) by mouth 2 (two) times daily with  a meal.  Dispense: 60 tablet; Refill: 5  6. PAD (peripheral artery disease) (HCC) Status post multiple interventions Risk factor modification is imperative including smoking cessation, diabetes control Continue to follow-up with vascular - furosemide (LASIX) 40 MG tablet; Take 1 tablet (40 mg total) by mouth daily.  Dispense: 30 tablet; Refill: 6 - potassium chloride SA (KLOR-CON M) 20 MEQ tablet; Take 1 tablet (20 mEq total) by mouth daily.  Dispense: 30 tablet; Refill: 6  Meds ordered this encounter  Medications   Dulaglutide (TRULICITY) 1.5 WH/6.7RF SOPN    Sig: Inject 1.5 mg into the skin once a week.    Dispense:  2 mL    Refill:  6    Dose increased   metFORMIN (GLUCOPHAGE) 500 MG tablet    Sig: Take 2 tablets (1,000 mg total) by mouth 2 (two) times daily with a meal.    Dispense:  120 tablet    Refill:  6    Dose increased   gabapentin (NEURONTIN) 300 MG capsule    Sig: Take 1 capsule (300 mg total) by mouth at bedtime.    Dispense:  30 capsule    Refill:  6   Dulaglutide (TRULICITY) 1.63 WG/6.6ZL SOPN    Sig: Inject 0.75 mg into the skin once a week. For 4 weeks then increase to 1.5 mg weekly    Dispense:  2 mL    Refill:  0   atorvastatin (LIPITOR) 80 MG tablet    Sig: Take 1 tablet (80 mg total) by mouth at bedtime.    Dispense:  30 tablet    Refill:  6   carvedilol (COREG) 6.25 MG tablet    Sig: Take 1 tablet (6.25 mg total) by mouth 2 (two) times daily with a meal.    Dispense:  60 tablet    Refill:  5   furosemide (LASIX) 40 MG tablet    Sig: Take 1 tablet (40 mg total) by mouth daily.    Dispense:  30 tablet    Refill:  6   potassium chloride SA (KLOR-CON M) 20 MEQ tablet    Sig: Take 1 tablet (20 mEq total) by mouth daily.    Dispense:  30 tablet    Refill:  6   insulin NPH-regular Human (HUMULIN 70/30) (70-30) 100 UNIT/ML injection  Sig: Inject 30 units into the skin twice daily (with breakfast and dinner)    Dispense:  30 mL    Refill:  6     Follow-up: Return in about 1 month (around 07/02/2022) for Blood sugar evaluation with Lurena Joiner, Medical conditions with PCP in 3 months.       Charlott Rakes, MD, FAAFP. [facility] and [place], [place]   06/01/2022, 1:02 PM

## 2022-06-01 NOTE — Patient Instructions (Signed)
Diabetic Neuropathy Diabetic neuropathy refers to nerve damage that is caused by diabetes. Over time, people with diabetes can develop nerve damage throughout the body. There are several types of diabetic neuropathy: Peripheral neuropathy. This is the most common type of diabetic neuropathy. It damages the nerves that carry signals between the spinal cord and other parts of the body (peripheral nerves). This usually affects nerves in the feet, legs, hands, and arms. Autonomic neuropathy. This type causes damage to nerves that control involuntary functions (autonomic nerves). Involuntary functions are functions of the body that you do not control. They include heartbeat, body temperature, blood pressure, urination, digestion, sweating, sexual function, or response to changes in blood glucose. Focal neuropathy. This type of nerve damage affects one area of the body, such as an arm, a leg, or the face. The injury may involve one nerve or a small group of nerves. Focal neuropathy can be painful and unpredictable. It occurs most often in older adults with diabetes. This often develops suddenly, but usually improves over time and does not cause long-term problems. Proximal neuropathy. This type of nerve damage affects the nerves of the thighs, hips, buttocks, or legs. It causes severe pain, weakness, and muscle death (atrophy), usually in the thigh muscles. It is more common among older men and people who have type 2 diabetes. The length of recovery time may vary. What are the causes? Peripheral, autonomic, and focal neuropathies are caused by diabetes that is not well controlled with treatment. The cause of proximal neuropathy is not known, but it may be caused by inflammation related to uncontrolled blood glucose levels. What are the signs or symptoms? Peripheral neuropathy Peripheral neuropathy develops slowly over time. When the nerves of the feet and legs no longer work, you may experience: Burning,  stabbing, or aching pain in the legs or feet. Pain or cramping in the legs or feet. Loss of feeling (numbness) and inability to feel pressure or pain in the feet. This can lead to: Thick calluses or sores on areas of constant pressure. Ulcers. Reduced ability to feel temperature changes. Foot deformities. Muscle weakness. Loss of balance or coordination. Autonomic neuropathy The symptoms of autonomic neuropathy vary depending on which nerves are affected. Symptoms may include: Problems with digestion, such as: Nausea or vomiting. Poor appetite. Bloating. Diarrhea or constipation. Trouble swallowing. Losing weight without trying to. Problems with the heart, blood, and lungs, such as: Dizziness, especially when standing up. Fainting. Shortness of breath. Irregular heartbeat. Bladder problems, such as: Trouble starting or stopping urination. Leaking urine. Trouble emptying the bladder. Urinary tract infections (UTIs). Problems with other body functions, such as: Sweat. You may sweat too much or too little. Temperature. You might get hot easily. Or, you might feel cold more than usual. Sexual function. Men may not be able to get or maintain an erection. Women may have vaginal dryness and difficulty with arousal. Focal neuropathy Symptoms affect only one area of the body. Common symptoms include: Numbness. Tingling. Burning pain. Prickling feeling. Very sensitive skin. Weakness. Inability to move (paralysis). Muscle twitching. Muscles getting smaller (wasting). Poor coordination. Double or blurred vision. Proximal neuropathy Sudden, severe pain in the hip, thigh, or buttocks. Pain may spread from the back into the legs (sciatica). Pain and numbness in the arms and legs. Tingling. Loss of bladder control or bowel control. Weakness and wasting of thigh muscles. Difficulty getting up from a seated position. Abdominal swelling. Unexplained weight loss. How is this  diagnosed? Diagnosis varies depending on the type   of neuropathy your health care provider suspects. Peripheral neuropathy Your health care provider will do a neurologic exam. This exam checks your reflexes, how you move, and what you can feel. You may have other tests, such as: Blood tests. Tests of the fluid that surrounds the spinal cord (lumbar puncture). CT scan. MRI. Checking the nerves that control muscles (electromyogram, or EMG). Checking how quickly signals pass through your nerves (nerve conduction study). Checking a small piece of a nerve using a microscope (biopsy). Autonomic neuropathy You may have tests, such as: Tests to measure your blood pressure and heart rate. You may be secured to an exam table that moves you from a lying position to an upright position (table tilt test). Breathing tests to check your lungs. Tests to check how food moves through the digestive system (gastric emptying tests). Blood, sweat, or urine tests. Ultrasound of your bladder. Spinal fluid tests. Focal neuropathy This condition may be diagnosed with: A neurologic exam. CT scan. MRI. EMG. Nerve conduction study. Proximal neuropathy There is no test to diagnose this type of neuropathy. You may have tests to rule out other possible causes of this type of neuropathy. Tests may include: X-rays of your spine and lumbar region. Lumbar puncture. MRI. How is this treated? The goal of treatment is to keep nerve damage from getting worse. Treatment may include: Following your diabetes management plan. This will help keep your blood glucose level and your A1C level within your target range. This is the most important treatment. Using prescription pain medicine. Follow these instructions at home: Diabetes management Follow your diabetes management plan as told by your health care provider. Check your blood glucose levels. Keep your blood glucose in your target range. Have your A1C level checked at  least two times a year, or as often as told. Take over the counter and prescription medicines only as told by your health care provider. This includes insulin and diabetes medicine.  Lifestyle  Do not use any products that contain nicotine or tobacco, such as cigarettes, e-cigarettes, and chewing tobacco. If you need help quitting, ask your health care provider. Be physically active every day. Include strength training and balance exercises. Follow a healthy meal plan. Work with your health care provider to manage your blood pressure. General instructions Ask your health care provider if the medicine prescribed to you requires you to avoid driving or using machinery. Check your skin and feet every day for cuts, bruises, redness, blisters, or sores. Keep all follow-up visits. This is important. Contact a health care provider if: You have burning, stabbing, or aching pain in your legs or feet. You are unable to feel pressure or pain in your feet. You develop problems with digestion, such as: Nausea. Vomiting. Bloating. Constipation. Diarrhea. Abdominal pain. You have difficulty with urination, such as: Inability to control when you urinate (incontinence). Inability to completely empty the bladder (retention). You feel as if your heart is racing (palpitations). You feel dizzy, weak, or faint when you stand up. Get help right away if: You cannot urinate. You have sudden weakness or loss of coordination. You have trouble speaking. You have pain or pressure in your chest. You have an irregular heartbeat. You have sudden inability to move a part of your body. These symptoms may represent a serious problem that is an emergency. Do not wait to see if the symptoms will go away. Get medical help right away. Call your local emergency services (911 in the [place] Do not drive yourself to   the hospital. Summary Diabetic neuropathy is nerve damage that is caused by diabetes. It can cause numbness  and pain in the arms, legs, digestive tract, heart, and other body systems. This condition is treated by keeping your blood glucose level and your A1C level within your target range. This can help prevent neuropathy from getting worse. Check your skin and feet every day for cuts, bruises, redness, blisters, or sores. Do not use any products that contain nicotine or tobacco, such as cigarettes, e-cigarettes, and chewing tobacco. This information is not intended to replace advice given to you by your health care provider. Make sure you discuss any questions you have with your health care provider. Document Revised: 02/28/2020 Document Reviewed: 02/28/2020 [employer] Patient Education  2023 [employer]  

## 2022-06-02 LAB — CMP14+EGFR
ALT: 17 IU/L (ref 0–44)
AST: 17 IU/L (ref 0–40)
Albumin/Globulin Ratio: 1.3 (ref 1.2–2.2)
Albumin: 4.1 g/dL (ref 3.9–4.9)
Alkaline Phosphatase: 90 IU/L (ref 44–121)
BUN/Creatinine Ratio: 11 (ref 10–24)
BUN: 12 mg/dL (ref 8–27)
Bilirubin Total: 0.6 mg/dL (ref 0.0–1.2)
CO2: 20 mmol/L (ref 20–29)
Calcium: 9 mg/dL (ref 8.6–10.2)
Chloride: 101 mmol/L (ref 96–106)
Creatinine, Ser: 1.14 mg/dL (ref 0.76–1.27)
Globulin, Total: 3.2 g/dL (ref 1.5–4.5)
Glucose: 212 mg/dL — ABNORMAL HIGH (ref 70–99)
Potassium: 4.6 mmol/L (ref 3.5–5.2)
Sodium: 136 mmol/L (ref 134–144)
Total Protein: 7.3 g/dL (ref 6.0–8.5)
eGFR: 72 mL/min/{1.73_m2} (ref >59)

## 2022-06-02 LAB — MICROALBUMIN / CREATININE URINE RATIO
Creatinine, Urine: 115.2 mg/dL
Microalb/Creat Ratio: 43 mg/g creat — ABNORMAL HIGH (ref 0–29)
Microalbumin, Urine: 49.3 ug/mL

## 2022-06-05 ENCOUNTER — Inpatient Hospital Stay (HOSPITAL_COMMUNITY)
Admission: EM | Admit: 2022-06-05 | Discharge: 2022-06-07 | DRG: 177 | Disposition: A | Discharge status: Home / Self Care | Payer: Self-pay | Attending: Internal Medicine | Admitting: Internal Medicine

## 2022-06-05 ENCOUNTER — Other Ambulatory Visit: Payer: Self-pay

## 2022-06-05 ENCOUNTER — Emergency Department (HOSPITAL_COMMUNITY): Payer: PRIVATE HEALTH INSURANCE

## 2022-06-05 ENCOUNTER — Encounter (HOSPITAL_COMMUNITY): Payer: Self-pay

## 2022-06-05 DIAGNOSIS — N182 Chronic kidney disease, stage 2 (mild): Secondary | ICD-10-CM

## 2022-06-05 DIAGNOSIS — U071 COVID-19: Principal | ICD-10-CM

## 2022-06-05 DIAGNOSIS — E1169 Type 2 diabetes mellitus with other specified complication: Secondary | ICD-10-CM | POA: Insufficient documentation

## 2022-06-05 DIAGNOSIS — J159 Unspecified bacterial pneumonia: Secondary | ICD-10-CM | POA: Diagnosis present

## 2022-06-05 DIAGNOSIS — Z885 Allergy status to narcotic agent status: Secondary | ICD-10-CM

## 2022-06-05 DIAGNOSIS — Z836 Family history of other diseases of the respiratory system: Secondary | ICD-10-CM

## 2022-06-05 DIAGNOSIS — I2510 Atherosclerotic heart disease of native coronary artery without angina pectoris: Secondary | ICD-10-CM | POA: Diagnosis present

## 2022-06-05 DIAGNOSIS — R0902 Hypoxemia: Secondary | ICD-10-CM

## 2022-06-05 DIAGNOSIS — E669 Obesity, unspecified: Secondary | ICD-10-CM | POA: Diagnosis present

## 2022-06-05 DIAGNOSIS — I5042 Chronic combined systolic (congestive) and diastolic (congestive) heart failure: Secondary | ICD-10-CM

## 2022-06-05 DIAGNOSIS — J9690 Respiratory failure, unspecified, unspecified whether with hypoxia or hypercapnia: Secondary | ICD-10-CM | POA: Diagnosis present

## 2022-06-05 DIAGNOSIS — E1151 Type 2 diabetes mellitus with diabetic peripheral angiopathy without gangrene: Secondary | ICD-10-CM | POA: Diagnosis present

## 2022-06-05 DIAGNOSIS — E66811 Obesity, class 1: Secondary | ICD-10-CM | POA: Diagnosis present

## 2022-06-05 DIAGNOSIS — J441 Chronic obstructive pulmonary disease with (acute) exacerbation: Secondary | ICD-10-CM | POA: Diagnosis present

## 2022-06-05 DIAGNOSIS — I252 Old myocardial infarction: Secondary | ICD-10-CM

## 2022-06-05 DIAGNOSIS — Z809 Family history of malignant neoplasm, unspecified: Secondary | ICD-10-CM

## 2022-06-05 DIAGNOSIS — J9601 Acute respiratory failure with hypoxia: Secondary | ICD-10-CM

## 2022-06-05 DIAGNOSIS — I4510 Unspecified right bundle-branch block: Secondary | ICD-10-CM | POA: Diagnosis present

## 2022-06-05 DIAGNOSIS — E1122 Type 2 diabetes mellitus with diabetic chronic kidney disease: Secondary | ICD-10-CM | POA: Diagnosis present

## 2022-06-05 DIAGNOSIS — I509 Heart failure, unspecified: Secondary | ICD-10-CM

## 2022-06-05 DIAGNOSIS — I130 Hypertensive heart and chronic kidney disease with heart failure and stage 1 through stage 4 chronic kidney disease, or unspecified chronic kidney disease: Secondary | ICD-10-CM | POA: Diagnosis present

## 2022-06-05 DIAGNOSIS — Z28311 Partially vaccinated for covid-19: Secondary | ICD-10-CM

## 2022-06-05 DIAGNOSIS — Z833 Family history of diabetes mellitus: Secondary | ICD-10-CM

## 2022-06-05 DIAGNOSIS — J1282 Pneumonia due to coronavirus disease 2019: Secondary | ICD-10-CM | POA: Diagnosis present

## 2022-06-05 DIAGNOSIS — Z6834 Body mass index (BMI) 34.0-34.9, adult: Secondary | ICD-10-CM

## 2022-06-05 DIAGNOSIS — I739 Peripheral vascular disease, unspecified: Secondary | ICD-10-CM

## 2022-06-05 DIAGNOSIS — I248 Other forms of acute ischemic heart disease: Secondary | ICD-10-CM | POA: Diagnosis present

## 2022-06-05 DIAGNOSIS — Z79899 Other long term (current) drug therapy: Secondary | ICD-10-CM

## 2022-06-05 DIAGNOSIS — Z794 Long term (current) use of insulin: Secondary | ICD-10-CM

## 2022-06-05 DIAGNOSIS — Z888 Allergy status to other drugs, medicaments and biological substances status: Secondary | ICD-10-CM

## 2022-06-05 DIAGNOSIS — E1110 Type 2 diabetes mellitus with ketoacidosis without coma: Secondary | ICD-10-CM

## 2022-06-05 DIAGNOSIS — E875 Hyperkalemia: Secondary | ICD-10-CM

## 2022-06-05 DIAGNOSIS — Z8249 Family history of ischemic heart disease and other diseases of the circulatory system: Secondary | ICD-10-CM

## 2022-06-05 DIAGNOSIS — N179 Acute kidney failure, unspecified: Secondary | ICD-10-CM

## 2022-06-05 DIAGNOSIS — Z951 Presence of aortocoronary bypass graft: Secondary | ICD-10-CM

## 2022-06-05 DIAGNOSIS — I959 Hypotension, unspecified: Secondary | ICD-10-CM | POA: Diagnosis present

## 2022-06-05 DIAGNOSIS — Z7985 Long-term (current) use of injectable non-insulin antidiabetic drugs: Secondary | ICD-10-CM

## 2022-06-05 DIAGNOSIS — E785 Hyperlipidemia, unspecified: Secondary | ICD-10-CM | POA: Insufficient documentation

## 2022-06-05 DIAGNOSIS — K219 Gastro-esophageal reflux disease without esophagitis: Secondary | ICD-10-CM | POA: Diagnosis present

## 2022-06-05 DIAGNOSIS — E1140 Type 2 diabetes mellitus with diabetic neuropathy, unspecified: Secondary | ICD-10-CM | POA: Diagnosis present

## 2022-06-05 DIAGNOSIS — F17210 Nicotine dependence, cigarettes, uncomplicated: Secondary | ICD-10-CM | POA: Diagnosis present

## 2022-06-05 DIAGNOSIS — H9190 Unspecified hearing loss, unspecified ear: Secondary | ICD-10-CM | POA: Diagnosis present

## 2022-06-05 DIAGNOSIS — Z7984 Long term (current) use of oral hypoglycemic drugs: Secondary | ICD-10-CM

## 2022-06-05 DIAGNOSIS — E872 Acidosis, unspecified: Secondary | ICD-10-CM | POA: Diagnosis present

## 2022-06-05 DIAGNOSIS — E1165 Type 2 diabetes mellitus with hyperglycemia: Secondary | ICD-10-CM

## 2022-06-05 DIAGNOSIS — J439 Emphysema, unspecified: Secondary | ICD-10-CM | POA: Diagnosis present

## 2022-06-05 DIAGNOSIS — E871 Hypo-osmolality and hyponatremia: Secondary | ICD-10-CM | POA: Diagnosis not present

## 2022-06-05 DIAGNOSIS — Z7982 Long term (current) use of aspirin: Secondary | ICD-10-CM

## 2022-06-05 HISTORY — DX: Heart failure, unspecified: I50.9

## 2022-06-05 LAB — D-DIMER, QUANTITATIVE: D-Dimer, Quant: 1.62 ug/mL-FEU — ABNORMAL HIGH (ref 0.00–0.50)

## 2022-06-05 LAB — BASIC METABOLIC PANEL
Anion gap: 8 (ref 5–15)
BUN: 19 mg/dL (ref 8–23)
CO2: 21 mmol/L — ABNORMAL LOW (ref 22–32)
Calcium: 8.4 mg/dL — ABNORMAL LOW (ref 8.9–10.3)
Chloride: 105 mmol/L (ref 98–111)
Creatinine, Ser: 1.45 mg/dL — ABNORMAL HIGH (ref 0.61–1.24)
GFR, Estimated: 54 mL/min — ABNORMAL LOW (ref >60)
Glucose, Bld: 332 mg/dL — ABNORMAL HIGH (ref 70–99)
Potassium: 4.7 mmol/L (ref 3.5–5.1)
Sodium: 134 mmol/L — ABNORMAL LOW (ref 135–145)

## 2022-06-05 LAB — COMPREHENSIVE METABOLIC PANEL
ALT: 23 U/L (ref 0–44)
AST: 28 U/L (ref 15–41)
Albumin: 3.2 g/dL — ABNORMAL LOW (ref 3.5–5.0)
Alkaline Phosphatase: 83 U/L (ref 38–126)
Anion gap: 8 (ref 5–15)
BUN: 15 mg/dL (ref 8–23)
CO2: 20 mmol/L — ABNORMAL LOW (ref 22–32)
Calcium: 8.1 mg/dL — ABNORMAL LOW (ref 8.9–10.3)
Chloride: 104 mmol/L (ref 98–111)
Creatinine, Ser: 1.54 mg/dL — ABNORMAL HIGH (ref 0.61–1.24)
GFR, Estimated: 50 mL/min — ABNORMAL LOW (ref >60)
Glucose, Bld: 501 mg/dL (ref 70–99)
Potassium: 5.4 mmol/L — ABNORMAL HIGH (ref 3.5–5.1)
Sodium: 132 mmol/L — ABNORMAL LOW (ref 135–145)
Total Bilirubin: 0.7 mg/dL (ref 0.3–1.2)
Total Protein: 7.4 g/dL (ref 6.5–8.1)

## 2022-06-05 LAB — I-STAT VENOUS BLOOD GAS, ED
Acid-base deficit: 6 mmol/L — ABNORMAL HIGH (ref 0.0–2.0)
Bicarbonate: 20.4 mmol/L (ref 20.0–28.0)
Calcium, Ion: 1.01 mmol/L — ABNORMAL LOW (ref 1.15–1.40)
HCT: 41 % (ref 39.0–52.0)
Hemoglobin: 13.9 g/dL (ref 13.0–17.0)
O2 Saturation: 97 %
Potassium: >8.5 mmol/L (ref 3.5–5.1)
Sodium: 131 mmol/L — ABNORMAL LOW (ref 135–145)
TCO2: 22 mmol/L (ref 22–32)
pCO2, Ven: 43.8 mmHg — ABNORMAL LOW (ref 44–60)
pH, Ven: 7.275 (ref 7.25–7.43)
pO2, Ven: 99 mmHg — ABNORMAL HIGH (ref 32–45)

## 2022-06-05 LAB — CBC
HCT: 36.3 % — ABNORMAL LOW (ref 39.0–52.0)
Hemoglobin: 11.9 g/dL — ABNORMAL LOW (ref 13.0–17.0)
MCH: 28.3 pg (ref 26.0–34.0)
MCHC: 32.8 g/dL (ref 30.0–36.0)
MCV: 86.4 fL (ref 80.0–100.0)
Platelets: 266 10*3/uL (ref 150–400)
RBC: 4.2 MIL/uL — ABNORMAL LOW (ref 4.22–5.81)
RDW: 16.1 % — ABNORMAL HIGH (ref 11.5–15.5)
WBC: 11.3 10*3/uL — ABNORMAL HIGH (ref 4.0–10.5)
nRBC: 0 % (ref 0.0–0.2)

## 2022-06-05 LAB — GLUCOSE, CAPILLARY
Glucose-Capillary: 266 mg/dL — ABNORMAL HIGH (ref 70–99)
Glucose-Capillary: 314 mg/dL — ABNORMAL HIGH (ref 70–99)

## 2022-06-05 LAB — CBC WITH DIFFERENTIAL/PLATELET
Abs Immature Granulocytes: 0.04 10*3/uL (ref 0.00–0.07)
Basophils Absolute: 0.1 10*3/uL (ref 0.0–0.1)
Basophils Relative: 1 %
Eosinophils Absolute: 0.3 10*3/uL (ref 0.0–0.5)
Eosinophils Relative: 3 %
HCT: 41.6 % (ref 39.0–52.0)
Hemoglobin: 13.3 g/dL (ref 13.0–17.0)
Immature Granulocytes: 1 %
Lymphocytes Relative: 17 %
Lymphs Abs: 1.4 10*3/uL (ref 0.7–4.0)
MCH: 28.4 pg (ref 26.0–34.0)
MCHC: 32 g/dL (ref 30.0–36.0)
MCV: 88.9 fL (ref 80.0–100.0)
Monocytes Absolute: 0.4 10*3/uL (ref 0.1–1.0)
Monocytes Relative: 5 %
Neutro Abs: 6.2 10*3/uL (ref 1.7–7.7)
Neutrophils Relative %: 73 %
Platelets: 372 10*3/uL (ref 150–400)
RBC: 4.68 MIL/uL (ref 4.22–5.81)
RDW: 16 % — ABNORMAL HIGH (ref 11.5–15.5)
WBC: 8.5 10*3/uL (ref 4.0–10.5)
nRBC: 0 % (ref 0.0–0.2)

## 2022-06-05 LAB — I-STAT CHEM 8, ED
BUN: 25 mg/dL — ABNORMAL HIGH (ref 8–23)
Calcium, Ion: 1.02 mmol/L — ABNORMAL LOW (ref 1.15–1.40)
Chloride: 104 mmol/L (ref 98–111)
Creatinine, Ser: 1.3 mg/dL — ABNORMAL HIGH (ref 0.61–1.24)
Glucose, Bld: 493 mg/dL — ABNORMAL HIGH (ref 70–99)
HCT: 42 % (ref 39.0–52.0)
Hemoglobin: 14.3 g/dL (ref 13.0–17.0)
Potassium: 8.4 mmol/L (ref 3.5–5.1)
Sodium: 131 mmol/L — ABNORMAL LOW (ref 135–145)
TCO2: 21 mmol/L — ABNORMAL LOW (ref 22–32)

## 2022-06-05 LAB — OSMOLALITY: Osmolality: 302 mOsm/kg — ABNORMAL HIGH (ref 275–295)

## 2022-06-05 LAB — RESP PANEL BY RT-PCR (FLU A&B, COVID) ARPGX2
Influenza A by PCR: NEGATIVE
Influenza B by PCR: NEGATIVE
SARS Coronavirus 2 by RT PCR: POSITIVE — AB

## 2022-06-05 LAB — ETHANOL: Alcohol, Ethyl (B): <10 mg/dL (ref <10)

## 2022-06-05 LAB — PROTIME-INR
INR: 1.3 — ABNORMAL HIGH (ref 0.8–1.2)
Prothrombin Time: 15.8 seconds — ABNORMAL HIGH (ref 11.4–15.2)

## 2022-06-05 LAB — PROCALCITONIN: Procalcitonin: 6.25 ng/mL

## 2022-06-05 LAB — BRAIN NATRIURETIC PEPTIDE: B Natriuretic Peptide: 640.9 pg/mL — ABNORMAL HIGH (ref 0.0–100.0)

## 2022-06-05 LAB — TROPONIN I (HIGH SENSITIVITY)
Troponin I (High Sensitivity): 27 ng/L — ABNORMAL HIGH (ref <18)
Troponin I (High Sensitivity): 61 ng/L — ABNORMAL HIGH (ref <18)
Troponin I (High Sensitivity): 116 ng/L (ref <18)

## 2022-06-05 LAB — CBG MONITORING, ED: Glucose-Capillary: 342 mg/dL — ABNORMAL HIGH (ref 70–99)

## 2022-06-05 LAB — BETA-HYDROXYBUTYRIC ACID: Beta-Hydroxybutyric Acid: 0.22 mmol/L (ref 0.05–0.27)

## 2022-06-05 LAB — POTASSIUM: Potassium: 6.1 mmol/L — ABNORMAL HIGH (ref 3.5–5.1)

## 2022-06-05 MED ORDER — SODIUM CHLORIDE 0.9 % IV SOLN
2.0000 g | INTRAVENOUS | Status: DC
  Administered 2022-06-05 – 2022-06-07 (×3): 2 g via INTRAVENOUS
  Filled 2022-06-05 (×3): qty 20

## 2022-06-05 MED ORDER — BARICITINIB 2 MG PO TABS
2.0000 mg | ORAL_TABLET | Freq: Every day | ORAL | Status: DC
  Administered 2022-06-05 – 2022-06-07 (×3): 2 mg via ORAL
  Filled 2022-06-05 (×3): qty 1

## 2022-06-05 MED ORDER — SODIUM CHLORIDE 0.9 % IV BOLUS
500.0000 mL | Freq: Once | INTRAVENOUS | Status: AC
  Administered 2022-06-05: 500 mL via INTRAVENOUS

## 2022-06-05 MED ORDER — INSULIN REGULAR(HUMAN) IN NACL 100-0.9 UT/100ML-% IV SOLN
INTRAVENOUS | Status: DC
  Administered 2022-06-05: 17 [IU]/h via INTRAVENOUS
  Filled 2022-06-05: qty 100

## 2022-06-05 MED ORDER — DEXAMETHASONE 6 MG PO TABS
6.0000 mg | ORAL_TABLET | ORAL | Status: DC
  Administered 2022-06-05 – 2022-06-07 (×3): 6 mg via ORAL
  Filled 2022-06-05 (×4): qty 1

## 2022-06-05 MED ORDER — BUDESONIDE 0.5 MG/2ML IN SUSP
0.5000 mg | Freq: Two times a day (BID) | RESPIRATORY_TRACT | Status: DC
  Administered 2022-06-05 – 2022-06-07 (×4): 0.5 mg via RESPIRATORY_TRACT
  Filled 2022-06-05 (×6): qty 2

## 2022-06-05 MED ORDER — INSULIN ASPART 100 UNIT/ML IV SOLN
10.0000 [IU] | Freq: Once | INTRAVENOUS | Status: AC
  Administered 2022-06-05: 10 [IU] via INTRAVENOUS

## 2022-06-05 MED ORDER — IOHEXOL 350 MG/ML SOLN
80.0000 mL | Freq: Once | INTRAVENOUS | Status: AC | PRN
  Administered 2022-06-05: 80 mL via INTRAVENOUS

## 2022-06-05 MED ORDER — INSULIN ASPART 100 UNIT/ML IJ SOLN: 3.0000 [IU] | INTRAMUSCULAR | Status: DC

## 2022-06-05 MED ORDER — SODIUM CHLORIDE 0.9 % IV SOLN
100.0000 mg | Freq: Every day | INTRAVENOUS | Status: AC
  Administered 2022-06-06 – 2022-06-07 (×2): 100 mg via INTRAVENOUS
  Filled 2022-06-05 (×2): qty 20

## 2022-06-05 MED ORDER — HEPARIN SODIUM (PORCINE) 5000 UNIT/ML IJ SOLN
5000.0000 [IU] | Freq: Three times a day (TID) | INTRAMUSCULAR | Status: DC
Start: 2022-06-05 — End: 2022-06-08
  Administered 2022-06-05 – 2022-06-07 (×6): 5000 [IU] via SUBCUTANEOUS
  Filled 2022-06-05 (×6): qty 1

## 2022-06-05 MED ORDER — DEXTROSE 50 % IV SOLN: 0.0000 mL | INTRAVENOUS | Status: DC | PRN

## 2022-06-05 MED ORDER — SODIUM CHLORIDE 0.9 % IV SOLN
200.0000 mg | Freq: Once | INTRAVENOUS | Status: AC
  Administered 2022-06-05: 200 mg via INTRAVENOUS
  Filled 2022-06-05 (×3): qty 40

## 2022-06-05 MED ORDER — SODIUM ZIRCONIUM CYCLOSILICATE 10 G PO PACK
10.0000 g | PACK | Freq: Once | ORAL | Status: AC
Start: 2022-06-05 — End: 2022-06-05
  Administered 2022-06-05: 10 g via ORAL
  Filled 2022-06-05: qty 1

## 2022-06-05 MED ORDER — ASPIRIN 81 MG PO TBEC: 81.0000 mg | DELAYED_RELEASE_TABLET | Freq: Every day | ORAL | Status: DC

## 2022-06-05 MED ORDER — DOCUSATE SODIUM 100 MG PO CAPS: 100.0000 mg | ORAL_CAPSULE | Freq: Two times a day (BID) | ORAL | Status: DC | PRN

## 2022-06-05 MED ORDER — SODIUM CHLORIDE 0.9 % IV SOLN
500.0000 mg | INTRAVENOUS | Status: DC
  Administered 2022-06-05 – 2022-06-06 (×2): 500 mg via INTRAVENOUS
  Filled 2022-06-05 (×5): qty 5

## 2022-06-05 MED ORDER — POLYETHYLENE GLYCOL 3350 17 G PO PACK
17.0000 g | PACK | Freq: Every day | ORAL | Status: DC | PRN
Start: 2022-06-05 — End: 2022-06-08

## 2022-06-05 MED ORDER — ORAL CARE MOUTH RINSE: 15.0000 mL | OROMUCOSAL | Status: DC | PRN

## 2022-06-05 MED ORDER — ASPIRIN 81 MG PO TBEC
81.0000 mg | DELAYED_RELEASE_TABLET | Freq: Every day | ORAL | Status: DC
  Administered 2022-06-06 – 2022-06-07 (×2): 81 mg via ORAL
  Filled 2022-06-05 (×2): qty 1

## 2022-06-05 MED ORDER — FUROSEMIDE 10 MG/ML IJ SOLN
40.0000 mg | Freq: Once | INTRAMUSCULAR | Status: AC
  Administered 2022-06-05: 40 mg via INTRAVENOUS
  Filled 2022-06-05: qty 4

## 2022-06-05 MED ORDER — ATORVASTATIN CALCIUM 80 MG PO TABS
80.0000 mg | ORAL_TABLET | Freq: Every day | ORAL | Status: DC
  Administered 2022-06-05 – 2022-06-06 (×2): 80 mg via ORAL
  Filled 2022-06-05: qty 1
  Filled 2022-06-05: qty 2

## 2022-06-05 MED ORDER — ALBUTEROL SULFATE (2.5 MG/3ML) 0.083% IN NEBU
10.0000 mg/h | INHALATION_SOLUTION | Freq: Once | RESPIRATORY_TRACT | Status: AC
  Administered 2022-06-05: 10 mg/h via RESPIRATORY_TRACT
  Filled 2022-06-05: qty 3

## 2022-06-05 MED ORDER — ARFORMOTEROL TARTRATE 15 MCG/2ML IN NEBU
15.0000 ug | INHALATION_SOLUTION | Freq: Two times a day (BID) | RESPIRATORY_TRACT | Status: DC
  Administered 2022-06-06 – 2022-06-07 (×3): 15 ug via RESPIRATORY_TRACT
  Filled 2022-06-05 (×8): qty 2

## 2022-06-05 MED ORDER — METHYLPREDNISOLONE SODIUM SUCC 125 MG IJ SOLR
125.0000 mg | Freq: Once | INTRAMUSCULAR | Status: AC
  Administered 2022-06-05: 125 mg via INTRAVENOUS
  Filled 2022-06-05: qty 2

## 2022-06-05 MED ORDER — IPRATROPIUM-ALBUTEROL 0.5-2.5 (3) MG/3ML IN SOLN
3.0000 mL | RESPIRATORY_TRACT | Status: DC
  Administered 2022-06-05 – 2022-06-06 (×4): 3 mL via RESPIRATORY_TRACT
  Filled 2022-06-05 (×3): qty 3

## 2022-06-05 NOTE — ED Provider Notes (Signed)
[facility] EMERGENCY DEPARTMENT Provider Note   CSN: [id number] Arrival date & time: 06/05/22  1246     History  Chief Complaint  Patient presents with   cpap/SOB    Robert Sanchez is a 66 y.o. male.  HPI Patient presents by EMS for evaluation of shortness of breath with low pulse oxygenation.  He had some improvement during transport with magnesium.  He was also treated with albuterol.  He did not receive steroids or Atrovent.  He uses tobacco and has a history of peripheral vascular disease, and coronary artery disease.      Home Medications Prior to Admission medications   Medication Sig Start Date End Date Taking? Authorizing Provider  aspirin 81 MG EC tablet Take 1 tablet (81 mg total) by mouth daily. Swallow whole. 08/31/21   Ghimire, Henreitta Leber, MD  atorvastatin (LIPITOR) 80 MG tablet Take 1 tablet (80 mg total) by mouth at bedtime. 06/01/22 06/01/23  Charlott Rakes, MD  Blood Glucose Monitoring Suppl (ONETOUCH VERIO) w/Device KIT Use to test blood sugars up to 4 times daily as directed. 12/21/21   Charlott Rakes, MD  carvedilol (COREG) 6.25 MG tablet Take 1 tablet (6.25 mg total) by mouth 2 (two) times daily with a meal. 06/01/22   Newlin, Enobong, MD  Dulaglutide (TRULICITY) 5.10 CH/8.5ID SOPN Inject 0.75 mg into the skin once a week. For 4 weeks then increase to 1.5 mg weekly 06/01/22   Charlott Rakes, MD  Dulaglutide (TRULICITY) 1.5 PO/2.4MP SOPN Inject 1.5 mg into the skin once a week. 06/01/22   Charlott Rakes, MD  furosemide (LASIX) 40 MG tablet Take 1 tablet (40 mg total) by mouth daily. 06/01/22   Charlott Rakes, MD  gabapentin (NEURONTIN) 300 MG capsule Take 1 capsule (300 mg total) by mouth at bedtime. 06/01/22   Charlott Rakes, MD  glucose blood (ONETOUCH VERIO) test strip Use to test blood sugars up to 4 times daily as directed. 12/21/21   Charlott Rakes, MD  insulin NPH-regular Human (HUMULIN 70/30) (70-30) 100 UNIT/ML injection Inject 30 units  into the skin twice daily (with breakfast and dinner) 06/01/22   Charlott Rakes, MD  Insulin Pen Needle 32G X 4 MM MISC Use as directed with insulin pen Patient not taking: Reported on 06/01/2022 08/31/21   Jonetta Osgood, MD  Insulin Syringe-Needle U-100 30G X 5/16" 1 ML MISC Use to inject insulin twice daily. 12/15/21   Charlott Rakes, MD  Lancets [facility] DELICA PLUS [id number]) MISC Use to test blood sugars up to 4 times daily as directed. 12/21/21   Charlott Rakes, MD  losartan (COZAAR) 25 MG tablet Take 0.5 tablets (12.5 mg total) by mouth at bedtime. 09/08/21 06/21/22  Lyda Jester M, PA-C  metFORMIN (GLUCOPHAGE) 500 MG tablet Take 2 tablets (1,000 mg total) by mouth 2 (two) times daily with a meal. 06/01/22   Charlott Rakes, MD  Multiple Vitamin (MULTIVITAMIN) tablet Take 1 tablet by mouth daily.    [provider]  oxyCODONE-acetaminophen (PERCOCET/ROXICET) 5-325 MG tablet Take 1 tablet by mouth every 6 (six) hours as needed for moderate pain. Patient not taking: Reported on 06/01/2022 11/26/21   Baglia, Corrina, PA-C  potassium chloride SA (KLOR-CON M) 20 MEQ tablet Take 1 tablet (20 mEq total) by mouth daily. 06/01/22   Charlott Rakes, MD  rivaroxaban (XARELTO) 20 MG TABS tablet Take 1 tablet (20 mg total) by mouth daily with supper. Patient not taking: Reported on 06/01/2022 08/31/21 08/31/22  Jonetta Osgood, MD  spironolactone (ALDACTONE) 25 MG tablet Take 0.5 tablets (12.5 mg total) by mouth daily. 09/08/21 04/18/22  Consuelo Pandy, PA-C      Allergies    Lisinopril and Codeine    Review of Systems   Review of Systems  Physical Exam Updated Vital Signs BP 99/60   Pulse 91   Temp (!) 97.5 F (36.4 C) (Axillary)   Resp (!) 23   Ht '5\' 9"'  (1.753 m)   SpO2 94%   BMI 34.11 kg/m  Physical Exam Vitals and nursing note reviewed.  Constitutional:      General: He is in acute distress (Ill-appearing).     Appearance: He is well-developed. He is not  ill-appearing.  HENT:     Head: Normocephalic and atraumatic.     Right Ear: External ear normal.     Left Ear: External ear normal.     Nose: No congestion.     Mouth/Throat:     Mouth: Mucous membranes are moist.  Eyes:     Conjunctiva/sclera: Conjunctivae normal.     Pupils: Pupils are equal, round, and reactive to light.  Neck:     Trachea: Phonation normal.  Cardiovascular:     Rate and Rhythm: Normal rate and regular rhythm.     Heart sounds: Normal heart sounds.     Comments: No visible JVD. Pulmonary:     Effort: Pulmonary effort is normal. No respiratory distress.     Breath sounds: Normal breath sounds. No stridor.     Comments: Tachypneic.  Rhonchi present.  Few wheezes present.  Air movement heard in the bases bilaterally. Abdominal:     General: There is no distension.     Palpations: Abdomen is soft.     Tenderness: There is no abdominal tenderness.  Musculoskeletal:        General: Normal range of motion.     Cervical back: Normal range of motion and neck supple. No tenderness.     Right lower leg: Edema present.     Left lower leg: Edema present.  Skin:    General: Skin is warm and dry.  Neurological:     Mental Status: He is alert and oriented to person, place, and time.     Cranial Nerves: No cranial nerve deficit.     Sensory: No sensory deficit.     Motor: No abnormal muscle tone.     Coordination: Coordination normal.  Psychiatric:        Mood and Affect: Mood normal.        Behavior: Behavior normal.        Thought Content: Thought content normal.        Judgment: Judgment normal.     ED Results / Procedures / Treatments   Labs (all labs ordered are listed, but only abnormal results are displayed) Labs Reviewed  RESP PANEL BY RT-PCR (FLU A&B, COVID) [id number] - Abnormal; Notable for the following components:      Result Value   SARS Coronavirus 2 by RT PCR POSITIVE (*)    All other components within normal limits  COMPREHENSIVE METABOLIC PANEL  - Abnormal; Notable for the following components:   Sodium 132 (*)    Potassium 5.4 (*)    CO2 20 (*)    Glucose, Bld 501 (*)    Creatinine, Ser 1.54 (*)    Calcium 8.1 (*)    Albumin 3.2 (*)    GFR, Estimated 50 (*)    All other components within normal limits  CBC WITH DIFFERENTIAL/PLATELET - Abnormal; Notable for the following components:   RDW 16.0 (*)    All other components within normal limits  D-DIMER, QUANTITATIVE - Abnormal; Notable for the following components:   D-Dimer, Quant 1.62 (*)    All other components within normal limits  PROTIME-INR - Abnormal; Notable for the following components:   Prothrombin Time 15.8 (*)    INR 1.3 (*)    All other components within normal limits  BRAIN NATRIURETIC PEPTIDE - Abnormal; Notable for the following components:   B Natriuretic Peptide 640.9 (*)    All other components within normal limits  POTASSIUM - Abnormal; Notable for the following components:   Potassium 6.1 (*)    All other components within normal limits  I-STAT VENOUS BLOOD GAS, ED - Abnormal; Notable for the following components:   pCO2, Ven 43.8 (*)    pO2, Ven 99 (*)    Acid-base deficit 6.0 (*)    Sodium 131 (*)    Potassium >8.5 (*)    Calcium, Ion 1.01 (*)    All other components within normal limits  I-STAT CHEM 8, ED - Abnormal; Notable for the following components:   Sodium 131 (*)    Potassium 8.4 (*)    BUN 25 (*)    Creatinine, Ser 1.30 (*)    Glucose, Bld 493 (*)    Calcium, Ion 1.02 (*)    TCO2 21 (*)    All other components within normal limits  TROPONIN I (HIGH SENSITIVITY) - Abnormal; Notable for the following components:   Troponin I (High Sensitivity) 27 (*)    All other components within normal limits  TROPONIN I (HIGH SENSITIVITY) - Abnormal; Notable for the following components:   Troponin I (High Sensitivity) 61 (*)    All other components within normal limits  ETHANOL  OSMOLALITY  CBC  CREATININE, SERUM  URINALYSIS, ROUTINE W  REFLEX MICROSCOPIC    EKG EKG Interpretation  Date/Time:  Saturday June 05 2022 14:04:53 EDT Ventricular Rate:  125 PR Interval:  133 QRS Duration: 142 QT Interval:  323 QTC Calculation: 466 R Axis:   71 Text Interpretation: Sinus tachycardia Probable left atrial enlargement Right bundle branch block Consider inferior infarct Since last tracing of earlier today Right bundle branch block now present Otherwise no significant change Confirmed by Daleen Bo [phone]) on 06/05/2022 2:10:24 PM  Radiology CT Angio Chest PE W and/or Wo Contrast  Result Date: 06/05/2022 CLINICAL DATA:  Pulmonary embolism (PE) suspected, positive D-dimer EXAM: CT ANGIOGRAPHY CHEST WITH CONTRAST TECHNIQUE: Multidetector CT imaging of the chest was performed using the standard protocol during bolus administration of intravenous contrast. Multiplanar CT image reconstructions and MIPs were obtained to evaluate the vascular anatomy. RADIATION DOSE REDUCTION: This exam was performed according to the departmental dose-optimization program which includes automated exposure control, adjustment of the mA and/or kV according to patient size and/or use of iterative reconstruction technique. CONTRAST:  31m OMNIPAQUE IOHEXOL 350 MG/ML SOLN COMPARISON:  CT dated June 09, 2018 FINDINGS: Cardiovascular: Satisfactory opacification of the pulmonary arteries to the segmental level. Evaluation of the bases is limited secondary to respiratory motion. No evidence of pulmonary embolism. Heart is enlarged. No pericardial effusion. Three-vessel coronary artery atherosclerotic calcifications. Status post median sternotomy and CABG. Atherosclerotic calcifications of the nonaneurysmal aorta Mediastinum/Nodes: No axillary adenopathy. There are multiple prominent mediastinal lymph nodes, increased in size in comparison to prior. Representative RIGHT paratracheal lymph node measures 11 mm in the short axis, previously 8 mm (  series 7, image 57). Thyroid  is unremarkable. Lungs/Pleura: Trace RIGHT pleural effusion. Bibasilar hypoenhancing consolidative opacities with diffuse peribronchovascular ground-glass opacities most pronounced in RIGHT lung and LEFT lower lobe. Relative sparing of the LEFT upper lobe. Small RIGHT pleural effusion. Interlobular septal thickening. Mild bronchial wall thickening. Upper Abdomen: No acute abnormality. Musculoskeletal: No acute osseous abnormality.  Gynecomastia. Review of the MIP images confirms the above findings. IMPRESSION: 1. No acute pulmonary embolism. 2. Bibasilar consolidative hypoenhancing opacities, concerning for multifocal infection. 3. Peribronchovascular ground-glass opacities with interlobular septal thickening and small RIGHT pleural effusion. Findings could reflect superimposed pulmonary edema versus atypical infection. 4. Increase in size of multiple mediastinal lymph nodes, favored to be reactive in etiology. Aortic Atherosclerosis (ICD10-I70.0). Electronically Signed   By: Valentino Saxon M.D.   On: 06/05/2022 17:59   DG Chest Port 1 View  Result Date: 06/05/2022 CLINICAL DATA:  Shortness of breath EXAM: PORTABLE CHEST 1 VIEW COMPARISON:  August 29, 2021 FINDINGS: Stable cardiomegaly. No pneumothorax. Diffusing bilateral pulmonary infiltrates, right greater than left. No other acute abnormalities. IMPRESSION: Bilateral pulmonary infiltrates, right greater than left may represent asymmetric pulmonary edema. An infectious process is not excluded. Recommend clinical correlation and attention on follow-up. Electronically Signed   By: Dorise Bullion III M.D.   On: 06/05/2022 13:10    Procedures Procedures    Medications Ordered in ED Medications  sodium zirconium cyclosilicate (LOKELMA) packet 10 g (has no administration in time range)  insulin aspart (novoLOG) injection 10 Units (has no administration in time range)  sodium chloride 0.9 % bolus 500 mL (has no administration in time range)  docusate  sodium (COLACE) capsule 100 mg (has no administration in time range)  polyethylene glycol (MIRALAX / GLYCOLAX) packet 17 g (has no administration in time range)  heparin injection 5,000 Units (has no administration in time range)  methylPREDNISolone sodium succinate (SOLU-MEDROL) 125 mg/2 mL injection 125 mg (125 mg Intravenous Given 06/05/22 1329)  furosemide (LASIX) injection 40 mg (40 mg Intravenous Given 06/05/22 1329)  albuterol (PROVENTIL) (2.5 MG/3ML) 0.083% nebulizer solution (10 mg/hr Nebulization Given 06/05/22 1416)  iohexol (OMNIPAQUE) 350 MG/ML injection 80 mL (80 mLs Intravenous Contrast Given 06/05/22 1741)    ED Course/ Medical Decision Making/ A&P Clinical Course as of 06/05/22 1805  Sat Jun 05, 2022  1300 He has been placed on BiPAP, oxygen saturations 95%.  He is tachycardic at 127. [EW]  5176 Chest x-ray with atypical infection versus asymmetric pulmonary edema.  Initial temperature was axillary.  Will obtain core temperature.  Lasix ordered.  Differential diagnosis includes COVID infection.  COPD exacerbation, CHF.  This time the patient is alert.  He is conversant.  He states he still feels short of breath.  He does not require intubation at this time. [EW]  1412 Potassium high on initial testing, suspect hemolyzed as creatinine is not significantly elevated.  Will check serum potassium.  EKG initially sinus tachycardia, then converted to right bundle branch block.  No other arrhythmias, or interval change. [EW]  1607 Blood pressure 99 systolic at this time.  Heart rate 91.  Oxygen saturation 94% while on BiPAP.  We will give saline bolus.  Contacting intensivist for admission at the request of hospitalist. [EW]    Clinical Course User Index [EW] Daleen Bo, MD                           Medical Decision Making Patient presenting with shortness of  breath, found to be acutely hypoxic in the field.  He is a smoker but does not have a documented history of COPD.  He has CHF with  low EF.  He is presenting by EMS for evaluation after treatment in the field.  He states he has had COVID vaccines but "it has been a while."  Problems Addressed: COPD exacerbation ([place]): acute illness or injury that poses a threat to life or bodily functions COVID-19 virus infection: acute illness or injury that poses a threat to life or bodily functions Hyperkalemia: acute illness or injury Hypoxia: acute illness or injury  Amount and/or Complexity of Data Reviewed External Data Reviewed: notes.    Details: PCP evaluation 06/01/2022, he was found to have uncontrolled type 2 diabetes with an elevated A1c and high blood sugar.  Plans at that time were to restart Trulicity.  He was also encouraged to follow better dietary restraints.  Additionally he was treated with gabapentin for neuropathy.  He was counseled to stop smoking. Labs: ordered.    Details: CBC, metabolic panel, troponin, delta troponin, VBG, viral panel, BNP, alcohol level-normal except COVID-positive, BNP high, D-dimer high, initial troponin high, second troponin somewhat higher.  Sodium low, potassium high, creatinine high, calcium low, albumin low, GFR low Radiology: ordered and independent interpretation performed.    Details: Chest x-ray -- nonspecific infiltrate left greater than right, infection versus pleural effusion: No consolidations.  CT angio chest, consolidations, infiltrate, no PE. ECG/medicine tests: ordered and independent interpretation performed.    Details: Cardiac monitor -- sinus tachycardia Discussion of management or test interpretation with external provider(s): Consult hospitalist arrange for admission.  Hospitalist requested intensivist consultation.  Intensivist consulted will evaluate for admission.  Risk Prescription drug management. Decision regarding hospitalization. Risk Details: Patient presenting from home, he is a smoker has shortness of breath, found to have COVID infection.  He has had at  least some of his COVID vaccines.  He has a history of heart failure, with low EF, but does not appear specifically fluid overloaded.  In the ED he was treated with BiPAP, wanted to get off it for a bit, to drink and talk but then desaturated so the BiPAP was replaced.  Treated for COPD exacerbation with nebulizer, steroids.  Hyperkalemia, possibly related to AKI, treated with medications.  CTA ordered because of elevated D-dimer, pending at the time of admission.  Patient had subjective improvement on BiPAP, but had to be replaced on it.  He will require hospitalization for further management and treatment.  Critical Care Total time providing critical care: 55 minutes           Final Clinical Impression(s) / ED Diagnoses Final diagnoses:  COVID-19 virus infection  Hypoxia  Hyperkalemia  COPD exacerbation ([place])    Rx / DC Orders ED Discharge Orders     None         Daleen Bo, MD 06/05/22 1805

## 2022-06-05 NOTE — ED Notes (Signed)
Bipap machine was beeping so I entered room and pt had mask halfway off his face and was asking for water and talking to me in full sentences.  After discussing it with EDP I trialed the pt off of Bipap and got him water. When I returned to room the pt was satting 77% on RA but not in distress.  I placed the pt on 4L, then 6L [place], the pt never got above 80 and was still speaking full sentences and drinking water.  After a discussion pt agreed to be placed back on Bipap and automatically rebounded to 90%.

## 2022-06-05 NOTE — ED Notes (Signed)
Dr,Wentz shown results of VBG 06/05/2022 ED-Lab.

## 2022-06-05 NOTE — ED Triage Notes (Signed)
Pt bib [facility] from home with complaints of increased shob. Pt was found 60% on RA and was placed on CPAP. Pt given 2 albuterol treatments, 2 nitro tabs, and 2G of mag en route with some relief. Pt arrives on CPAP with sats at 89%.

## 2022-06-05 NOTE — Progress Notes (Signed)
eLink Physician-Brief Progress Note Patient Name: Robert Sanchez DOB: 11/13/1955 MRN: [id number]   Date of Service  06/05/2022  HPI/Events of Note  Patient was asking for Neurontin earlier but appears somnolent right now. Blood sugar 314 mg / dl. Elevated  Troponin (likely demand)  eICU Interventions  Will defer starting Neurontin until AM given somnolence,  will continue Insulin gtt until blood sugar better controlled (currently 314 mg / dl). Will continue to cycle troponin and get echocardiogram in the am.        Thomasene Lot Nichele Slawson 06/05/2022, 10:57 PM

## 2022-06-05 NOTE — Progress Notes (Signed)
Placed pt on Servo Bipap while sleeping. Pt mouth breathing while sleeping on HHFNC and drops Sats. PT tolerating bipap well. Will continue to monitor.

## 2022-06-05 NOTE — ED Notes (Addendum)
Critical care team at bedside. Ordered repeat labs. This RN asked if they wanted to continue w/ IV insulin. Critical care verified that 10 units of insulin can be given.

## 2022-06-05 NOTE — ED Notes (Signed)
Pt states he's unable to breath, Dr. Effie Shy notified and in room.

## 2022-06-05 NOTE — Progress Notes (Signed)
I personally saw the patient and performed a substantive portion of this encounter, including a complete performance of at least one of the key components (MDM, Hx and/or Exam), in conjunction with the Advanced Practice Provider.    66 year old smoker, uncontrolled diabetes, EMS for shortness of breath for 1 day with chills, saturation 60% on room air, placed on CPAP and treated initially as COPD exacerbation with nebs, subsequently found to be COVID-positive ED course -mildly hypotensive requiring fluids, hypoxic on 6 to 8 L and placed on BiPAP Labs showed K of 5.4, BUN/creatinine 15/1.5, sugars 501, no anion gap, BNP 641. No leukocytosis CT angiogram chest negative for pulm embolism, showed bibasilar consolidation with groundglass infiltrates   PMH -uncontrolled diabetes, severe PAD, diabetic neuropathy Reviewed PCP note from 8/1.  On exam -on BiPAP, no distress, able to speak in full sentences through full facemask, crackles right base, no rhonchi, S1-S2 regular, no murmur, 1+ edema, soft nontender abdomen, no asterixis, no clubbing.  VBG noted  Impression/plan Acute hypoxic respiratory failure -placed on BiPAP due to hypoxia -ABG from 10/22 does not show hypercarbia -I think he will do better on high flow nasal cannula , we will switch him once he arrives to ICU bed   COVID-19 positive test (U07.1, COVID-19) with Acute Pneumonia (J12.89, Other viral pneumonia) -Treated with remdesivir, baricitinib per protocol -We will use dexamethasone but concerned that this will raise sugars more -We will add bacterial coverage with ceftriaxone/azithromycin  COPD/emphysema/smoker -Pulmicort/Brovana nebs  AKI -hyperkalemia appears to be spurious on i-STAT was 5.4 on BMET, will repeat BMET  Uncontrolled hyperglycemia -likely to be worsened with steroids, will start insulin drip  My in dependent critical care time was 45 minutes   Robert Sanchez V. AlvaM D

## 2022-06-05 NOTE — Progress Notes (Signed)
Pt transported from room 8 in ED to 2M03, RN at the bedside. No complications at this time. Will continue to monitor.

## 2022-06-05 NOTE — Progress Notes (Signed)
Transported on BiPAP to CT and back to ED 08 without complications

## 2022-06-05 NOTE — Progress Notes (Signed)
RT placed pt on BiPAP through Ventilator due to patient being COVID positive.

## 2022-06-05 NOTE — ED Notes (Signed)
Dr.Wentz made aware of Chem8 lab results. ED-Lab.

## 2022-06-05 NOTE — H&P (Signed)
NAME:  Robert Sanchez, MRN:  [id number], DOB:  10/02/1956, LOS: 0 ADMISSION DATE:  06/05/2022, CONSULTATION DATE:  06/05/22 REFERRING MD:  Dr. Eulis Foster, CHIEF COMPLAINT:  SOB/hypoxia   History of Present Illness:   47 yoM with hx as below presenting from home with complaints of chills, dry cough, and shortness of breath for one day.  No recent sick contacts.  Previously received 2 COVID vaccines.   Found by EMS with RA sats 60%.  Placed on CPAP, treated with albuterol, nitro, and Mag 2gms with some relief and improvement in saturations to 89%.   In ER, patient afebrile, initially tachycardic and hypertensive but since progressively declining, now SBP ~100 with maps> 70.   Labs noted for K 5.4, bicarb 20, glucose 501, sCr 1.54, BNP 640, trop 27> 61, normal CBC, d-dimer 1.62, INR 1.3 and found to be COVID positive.  EKG ST with new RBBB.  ABG 7.275/ 43/ 99/ 20.  CXR with R>L pulmonary infiltrates.  CTA PE pending.  Required BiPAP in ER for ongoing hypoxia, easily desaturations and for work of breathing.   Pertinent  Medical History  Tobacco abuse, emphysema/ COPD, uncontrolled DMT2, CAD s/p CABG, systolic and diastolic HF, PVD, diabetic neuropathy, GERD, HLD, HOH   Significant Hospital Events: Including procedures, antibiotic start and stop dates in addition to other pertinent events   8/5 admitted COVID +, multifocal pna, bipap, multiple metabolic derangements.    8/5 CTA PE> 1. No acute pulmonary embolism. 2. Bibasilar consolidative hypoenhancing opacities, concerning for multifocal infection. 3. Peribronchovascular ground-glass opacities with interlobular septal thickening and small RIGHT pleural effusion. Findings could reflect superimposed pulmonary edema versus atypical infection. 4. Increase in size of multiple mediastinal lymph nodes, favored to be reactive in etiology.  Aortic Atherosclerosis  Interim History / Subjective:   Objective   Blood pressure 99/60, pulse 91, temperature  (!) 97.5 F (36.4 C), temperature source Axillary, resp. rate (!) 23, height '5\' 9"'  (1.753 m), SpO2 94 %.    FiO2 (%):  [50 %] 50 %   Intake/Output Summary (Last 24 hours) at 06/05/2022 1803 Last data filed at 06/05/2022 1400 Gross per 24 hour  Intake --  Output 150 ml  Net -150 ml   There were no vitals filed for this visit.  Examination: Per Dr. Elsworth Soho  [facility] Problem list    Assessment & Plan:   Acute hypoxic respiratory failure Multifocal PNA COVID + Hx COPD/ emphysema Current tobacco abuse Admit to ICU for close monitoring CTA PE negative for PE  Currently in no distress in BiPAP.  Can switch to [place] once in ICU Maintain O2 saturations for goal SpO2 > 85% Empiric CAP coverage with ceftriaxone/ azithro Send urine strep and legionella Check PCT and trend  Duonebs, brovanna/ pulmicort  Nicotine patch prn  Intermittent CXR VBG if signs of hypercarbia/ AMS Airborne precautions  Monitor closely for signs of ventilatory failure such as nasal flaring, accessory muscle use, abdominal paradoxical movements.   Continue decadron 35m daily for 10 days, remdesivir 5 days per pharmacy, and baricitinib  Ongoing aggressive pulmonary hygiene, progress activity as tolerated   AKI Hyperkalemia Mild NAGMA Corrected Na 138 - repeat BMET now - strict I/Os, daily wts, avoid nephrotoxins   Uncontrolled DMT2 with hyperglycemia - UA and beta-hydroxybutyric acid pending - insulin gtt per endo tool > will need given decadron as above  Hx combined systolic and diastolic HF  HTN - hold home meds and diuretics given soft BP.  Consider  diureses when BP allows - continue lipitor - repeat troponin till peak, 27> 61>   PVD - verified not taking xarelto since April 2023  Best Practice (right click and "Reselect all SmartList Selections" daily)   Diet/type: NPO DVT prophylaxis: prophylactic heparin  GI prophylaxis: PPI Lines: N/A Foley:  N/A Code Status:  full code Last date  of multidisciplinary goals of care discussion [8/5]  Labs   CBC: Recent Labs  Lab 06/05/22 1259 06/05/22 1316 06/05/22 1325  WBC 8.5  --   --   NEUTROABS 6.2  --   --   HGB 13.3 13.9 14.3  HCT 41.6 41.0 42.0  MCV 88.9  --   --   PLT 372  --   --     Basic Metabolic Panel: Recent Labs  Lab 06/01/22 1034 06/05/22 1259 06/05/22 1316 06/05/22 1325 06/05/22 1452  NA 136 132* 131* 131*  --   K 4.6 5.4* >8.5* 8.4* 6.1*  CL 101 104  --  104  --   CO2 20 20*  --   --   --   GLUCOSE 212* 501*  --  493*  --   BUN 12 15  --  25*  --   CREATININE 1.14 1.54*  --  1.30*  --   CALCIUM 9.0 8.1*  --   --   --    GFR: Estimated Creatinine Clearance: 68.4 mL/min (A) (by C-G formula based on SCr of 1.3 mg/dL (H)). Recent Labs  Lab 06/05/22 1259  WBC 8.5    Liver Function Tests: Recent Labs  Lab 06/01/22 1034 06/05/22 1259  AST 17 28  ALT 17 23  ALKPHOS 90 83  BILITOT 0.6 0.7  PROT 7.3 7.4  ALBUMIN 4.1 3.2*   No results for input(s): "LIPASE", "AMYLASE" in the last 168 hours. No results for input(s): "AMMONIA" in the last 168 hours.  ABG    Component Value Date/Time   PHART 7.225 (L) 11/23/2021 1024   PCO2ART 60.9 (H) 11/23/2021 1024   PO2ART 110 (H) 11/23/2021 1024   HCO3 20.4 06/05/2022 1316   TCO2 21 (L) 06/05/2022 1325   ACIDBASEDEF 6.0 (H) 06/05/2022 1316   O2SAT 97 06/05/2022 1316     Coagulation Profile: Recent Labs  Lab 06/05/22 1259  INR 1.3*    Cardiac Enzymes: No results for input(s): "CKTOTAL", "CKMB", "CKMBINDEX", "TROPONINI" in the last 168 hours.  HbA1C: HbA1c, POC (controlled diabetic range)  Date/Time Value Ref Range Status  06/01/2022 09:34 AM 9.4 (A) 0.0 - 7.0 % Final   Hgb A1c MFr Bld  Date/Time Value Ref Range Status  11/23/2021 12:50 PM 8.1 (H) 4.8 - 5.6 % Final    Comment:    (NOTE)         Prediabetes: 5.7 - 6.4         Diabetes: >6.4         Glycemic control for adults with diabetes: <7.0   08/28/2021 04:53 AM 13.3 (H)  4.8 - 5.6 % Final    Comment:    (NOTE) Pre diabetes:          5.7%-6.4%  Diabetes:              >6.4%  Glycemic control for   <7.0% adults with diabetes     CBG: No results for input(s): "GLUCAP" in the last 168 hours.  Review of Systems:   As per HPI otherwise negative   Past Medical History:  He,  has a past  medical history of Acute pulmonary edema ([place]) (06/05/2018), Arthritis, CHF (congestive heart failure) ([place]), COPD (chronic obstructive pulmonary disease) ([place]), Coronary artery disease, Diabetes mellitus without complication ([place]), Diabetic neuropathy ([place]), Diabetic neuropathy ([place]), Emphysema/COPD ([place]), GERD (gastroesophageal reflux disease), HLD (hyperlipidemia), HOH (hard of hearing), Myocardial infarction ([place]), and Peripheral vascular disease ([place]).   Surgical History:   Past Surgical History:  Procedure Laterality Date   ABDOMINAL AORTOGRAM W/LOWER EXTREMITY N/A 06/21/2018   Procedure: ABDOMINAL AORTOGRAM W/LOWER EXTREMITY;  Surgeon: [name], MD;  Location: [place] [facility];  Service: Cardiovascular;  Laterality: N/A;   ABDOMINAL AORTOGRAM W/LOWER EXTREMITY Left 01/16/2020   Procedure: ABDOMINAL AORTOGRAM W/LOWER EXTREMITY;  Surgeon: [name], MD;  Location: [place] [facility];  Service: Cardiovascular;  Laterality: Left;   ABDOMINAL AORTOGRAM W/LOWER EXTREMITY N/A 07/09/2020   Procedure: ABDOMINAL AORTOGRAM W/LOWER EXTREMITY;  Surgeon: [name], MD;  Location: [place] [facility];  Service: Cardiovascular;  Laterality: N/A;  TPA infusion   ABDOMINAL AORTOGRAM W/LOWER EXTREMITY Bilateral 11/20/2021   Procedure: ABDOMINAL AORTOGRAM W/LOWER EXTREMITY;  Surgeon: Cherre Robins, MD;  Location: [place] [facility];  Service: Cardiovascular;  Laterality: Bilateral;   AMPUTATION Left 08/04/2020   Procedure: LEFT PARTIAL GREAT TOE AMPUTATION;  Surgeon: [name], MD;  Location: [place];  Service: Vascular;  Laterality: Left;    ANGIOPLASTY Left 11/23/2021   Procedure: ANGIOPLASTY with BOVINE PATCH 1cmx6cm;  Surgeon: [name], MD;  Location: [place];  Service: Vascular;  Laterality: Left;   APPLICATION OF WOUND VAC Left 08/04/2020   Procedure: LEFT GROIN DEBRIDEMENT WITH APPLICATION OF WOUND [place];  Surgeon: [name], MD;  Location: [place];  Service: Vascular;  Laterality: Left;   CORONARY ARTERY BYPASS GRAFT N/A 06/12/2018   Procedure: CORONARY ARTERY BYPASS GRAFTING (CABG) x 3 WITH ENDOSCOPIC HARVESTING OF RIGHT GREATER SAPHENOUS VEIN. LIMA TO LAD. SVG TO PD. SVG TO DIAGONAL.;  Surgeon: Ivin Poot, MD;  Location: [place];  Service: Open Heart Surgery;  Laterality: N/A;   ENDARTERECTOMY FEMORAL Left 11/23/2021   Procedure: ENDARTERECTOMY FEMORAL;  Surgeon: [name], MD;  Location: [place];  Service: Vascular;  Laterality: Left;   FEMORAL-POPLITEAL BYPASS GRAFT Left 07/10/2020   Procedure: REDO EXPOSURE OF LEFT COMMON FEMORAL ARTERY LEFT COMMON FEMORAL TO POSTERIOR TIBIAL COMPOSITE BYPASS GRAFT;  Surgeon: [name], MD;  Location: [place];  Service: Vascular;  Laterality: Left;   FEMORAL-TIBIAL BYPASS GRAFT Left 08/21/2018   Procedure: BYPASS GRAFT LEFT FEMORAL TO POSTERIOR TIBIAL ARTERY USING LEFT REVERSED GREAT SAPHENOUS VEIN;  Surgeon: [name], MD;  Location: [place];  Service: Vascular;  Laterality: Left;   FEMORAL-TIBIAL BYPASS GRAFT Left 08/04/2020   Procedure: REVISION FEMORAL-DISTAL BYPASS WITH BIOLOGICAL BOVINE PATCH;  Surgeon: [name], MD;  Location: [place];  Service: Vascular;  Laterality: Left;   FEMORAL-TIBIAL BYPASS GRAFT Left 11/23/2021   Procedure: REDO LEFT FEMORAL-TIBIAL ARTERY BYPASS GRAFT USING PROPATEN 60m VASCULAR GRAFT REMOVABLE [place]  Surgeon: CMarty Heck MD;  [place]  Service: Vascular;  Laterality: Left;  Insert arterial line   KNEE ARTHROSCOPY     LOWER EXTREMITY ANGIOGRAM Left 08/04/2020   Procedure: LEFT ILIAC ANGIOGRAM,  ANGIOPLASTY OF LEFT ILIAC STENT WITH DRUG COATED BALLOON;  Surgeon: CMarty Heck MD;  [place]  Service: Vascular;  Laterality: Left;   LOWER EXTREMITY ANGIOGRAPHY Left 01/17/2020   Procedure: Lower Extremity Angiography;  Surgeon: CMarty Heck MD;  Location:  [place] [facility];  Service: Cardiovascular;  Laterality: Left;   LOWER EXTREMITY ANGIOGRAPHY N/A 01/07/2021   Procedure: LOWER EXTREMITY ANGIOGRAPHY;  Surgeon: Cherre Robins, MD;  Location: [place] [facility];  Service: Cardiovascular;  Laterality: N/A;   PERIPHERAL VASCULAR BALLOON ANGIOPLASTY Left 01/17/2020   Procedure: PERIPHERAL VASCULAR BALLOON ANGIOPLASTY;  Surgeon: [name], MD;  Location: [place] [facility];  Service: Cardiovascular;  Laterality: Left;  pt   PERIPHERAL VASCULAR INTERVENTION Left 06/21/2018   Procedure: PERIPHERAL VASCULAR INTERVENTION;  Surgeon: [name], MD;  Location: [place] [facility];  Service: Cardiovascular;  Laterality: Left;   PERIPHERAL VASCULAR INTERVENTION Left 01/08/2021   Procedure: PERIPHERAL VASCULAR INTERVENTION;  Surgeon: [name], MD;  Location: [place] [facility];  Service: Cardiovascular;  Laterality: Left;   PERIPHERAL VASCULAR THROMBECTOMY Left 01/16/2020   Procedure: PERIPHERAL VASCULAR THROMBECTOMY;  Surgeon: [name], MD;  Location: [place] [facility];  Service: Cardiovascular;  Laterality: Left;  Lytic Catheter Placement left fem-pop bypass   PERIPHERAL VASCULAR THROMBECTOMY Left 01/17/2020   Procedure: PERIPHERAL VASCULAR THROMBECTOMY;  Surgeon: [name], MD;  Location: [place] [facility];  Service: Cardiovascular;  Laterality: Left;  fem-pt bypass   PERIPHERAL VASCULAR THROMBECTOMY Left 07/10/2020   Procedure: lysis recheck;  Surgeon: [name], MD;  Location: [place] [facility];  Service: Cardiovascular;  Laterality: Left;   PULMONARY THROMBECTOMY N/A 01/08/2021   Procedure: LYSIS RECHECK;  Surgeon:  [name], MD;  Location: [place] [facility];  Service: Cardiovascular;  Laterality: N/A;   RIGHT/LEFT HEART CATH AND CORONARY ANGIOGRAPHY N/A 06/05/2018   Procedure: RIGHT/LEFT HEART CATH AND CORONARY ANGIOGRAPHY;  Surgeon: Dixie Dials, MD;  Location: [place] [facility];  Service: Cardiovascular;  Laterality: N/A;   SPINE SURGERY     TEE WITHOUT CARDIOVERSION N/A 06/12/2018   Procedure: TRANSESOPHAGEAL ECHOCARDIOGRAM (TEE);  Surgeon: Prescott Gum, Collier Salina, MD;  Location: [place];  Service: Open Heart Surgery;  Laterality: N/A;   teeth extractions     THROMBECTOMY FEMORAL ARTERY Left 08/04/2020   Procedure: THROMBECTOMY OF LEFT FEMORAL TP COMPOSTITE BYPASS;  Surgeon: [name], MD;  Location: [place];  Service: Vascular;  Laterality: Left;   THROMBECTOMY ILIAC ARTERY Left 07/10/2020   Procedure: THROMBECTOMY OF LEFT EXTERNAL ILIAC AND LEFT COMMON FEMORAL AND LEFT POSTERIOR TIBIAL ARTERIES;  Surgeon: [name], MD;  Location: [place];  Service: Vascular;  Laterality: Left;   ULTRASOUND GUIDANCE FOR VASCULAR ACCESS Bilateral 11/23/2021   Procedure: ULTRASOUND GUIDANCE FOR VASCULAR ACCESS;  Surgeon: [name], MD;  Location: [place];  Service: Vascular;  Laterality: Bilateral;   VEIN HARVEST Left 08/21/2018   Procedure: VEIN HARVEST LEFT GREAT SAPHENOUS VEIN;  Surgeon: [name], MD;  Location: [place];  Service: Vascular;  Laterality: Left;   VEIN HARVEST Right 11/23/2021   Procedure: VEIN HARVEST;  Surgeon: [name], MD;  Location: [place];  Service: Vascular;  Laterality: Right;     Social History:   reports that he has been smoking cigarettes. He has a 50.00 pack-year smoking history. He has never used smokeless tobacco. He reports current alcohol use. He reports current drug use. Drug: Marijuana.   Family History:  His family history includes Cancer in his father; Diabetes in his mother; Heart disease in his father; Lung disease in his mother.    Allergies Allergies  Allergen Reactions   Lisinopril Other (See Comments)    Syncope  Codeine Rash     Home Medications  Prior to Admission medications   Medication Sig Start Date End Date Taking? Authorizing Provider  aspirin 81 MG EC tablet Take 1 tablet (81 mg total) by mouth daily. Swallow whole. 08/31/21   Ghimire, Henreitta Leber, MD  atorvastatin (LIPITOR) 80 MG tablet Take 1 tablet (80 mg total) by mouth at bedtime. 06/01/22 06/01/23  Charlott Rakes, MD  Blood Glucose Monitoring Suppl (ONETOUCH VERIO) w/Device KIT Use to test blood sugars up to 4 times daily as directed. 12/21/21   Charlott Rakes, MD  carvedilol (COREG) 6.25 MG tablet Take 1 tablet (6.25 mg total) by mouth 2 (two) times daily with a meal. 06/01/22   Newlin, Enobong, MD  Dulaglutide (TRULICITY) 7.84 XQ/8.2KS SOPN Inject 0.75 mg into the skin once a week. For 4 weeks then increase to 1.5 mg weekly 06/01/22   Charlott Rakes, MD  Dulaglutide (TRULICITY) 1.5 HN/8.8TJ SOPN Inject 1.5 mg into the skin once a week. 06/01/22   Charlott Rakes, MD  furosemide (LASIX) 40 MG tablet Take 1 tablet (40 mg total) by mouth daily. 06/01/22   Charlott Rakes, MD  gabapentin (NEURONTIN) 300 MG capsule Take 1 capsule (300 mg total) by mouth at bedtime. 06/01/22   Charlott Rakes, MD  glucose blood (ONETOUCH VERIO) test strip Use to test blood sugars up to 4 times daily as directed. 12/21/21   Charlott Rakes, MD  insulin NPH-regular Human (HUMULIN 70/30) (70-30) 100 UNIT/ML injection Inject 30 units into the skin twice daily (with breakfast and dinner) 06/01/22   Charlott Rakes, MD  Insulin Pen Needle 32G X 4 MM MISC Use as directed with insulin pen Patient not taking: Reported on 06/01/2022 08/31/21   Jonetta Osgood, MD  Insulin Syringe-Needle U-100 30G X 5/16" 1 ML MISC Use to inject insulin twice daily. 12/15/21   Charlott Rakes, MD  Lancets [facility] DELICA PLUS [id number]) MISC Use to test blood sugars up to 4 times daily as directed. 12/21/21    Charlott Rakes, MD  losartan (COZAAR) 25 MG tablet Take 0.5 tablets (12.5 mg total) by mouth at bedtime. 09/08/21 06/21/22  Lyda Jester M, PA-C  metFORMIN (GLUCOPHAGE) 500 MG tablet Take 2 tablets (1,000 mg total) by mouth 2 (two) times daily with a meal. 06/01/22   Charlott Rakes, MD  Multiple Vitamin (MULTIVITAMIN) tablet Take 1 tablet by mouth daily.    [provider]  oxyCODONE-acetaminophen (PERCOCET/ROXICET) 5-325 MG tablet Take 1 tablet by mouth every 6 (six) hours as needed for moderate pain. Patient not taking: Reported on 06/01/2022 11/26/21   Baglia, Corrina, PA-C  potassium chloride SA (KLOR-CON M) 20 MEQ tablet Take 1 tablet (20 mEq total) by mouth daily. 06/01/22   Charlott Rakes, MD  rivaroxaban (XARELTO) 20 MG TABS tablet Take 1 tablet (20 mg total) by mouth daily with supper. Patient not taking: Reported on 06/01/2022 08/31/21 08/31/22  Jonetta Osgood, MD  spironolactone (ALDACTONE) 25 MG tablet Take 0.5 tablets (12.5 mg total) by mouth daily. 09/08/21 04/18/22  Consuelo Pandy, PA-C     Critical care time: 45 mins     Kennieth Rad, MSN, AG-ACNP-BC [facility] 06/05/2022, 6:54 PM  See Amion for pager If no response to pager, please call PCCM consult pager After 7:00 pm call [place]

## 2022-06-05 NOTE — ED Notes (Signed)
New orders , RT called

## 2022-06-06 DIAGNOSIS — U071 COVID-19: Secondary | ICD-10-CM

## 2022-06-06 DIAGNOSIS — J441 Chronic obstructive pulmonary disease with (acute) exacerbation: Secondary | ICD-10-CM

## 2022-06-06 DIAGNOSIS — E785 Hyperlipidemia, unspecified: Secondary | ICD-10-CM

## 2022-06-06 DIAGNOSIS — E1169 Type 2 diabetes mellitus with other specified complication: Secondary | ICD-10-CM

## 2022-06-06 DIAGNOSIS — N182 Chronic kidney disease, stage 2 (mild): Secondary | ICD-10-CM

## 2022-06-06 DIAGNOSIS — J1282 Pneumonia due to coronavirus disease 2019: Secondary | ICD-10-CM

## 2022-06-06 DIAGNOSIS — N179 Acute kidney failure, unspecified: Secondary | ICD-10-CM

## 2022-06-06 DIAGNOSIS — E669 Obesity, unspecified: Secondary | ICD-10-CM

## 2022-06-06 LAB — GLUCOSE, CAPILLARY
Glucose-Capillary: 138 mg/dL — ABNORMAL HIGH (ref 70–99)
Glucose-Capillary: 146 mg/dL — ABNORMAL HIGH (ref 70–99)
Glucose-Capillary: 151 mg/dL — ABNORMAL HIGH (ref 70–99)
Glucose-Capillary: 151 mg/dL — ABNORMAL HIGH (ref 70–99)
Glucose-Capillary: 163 mg/dL — ABNORMAL HIGH (ref 70–99)
Glucose-Capillary: 207 mg/dL — ABNORMAL HIGH (ref 70–99)
Glucose-Capillary: 241 mg/dL — ABNORMAL HIGH (ref 70–99)
Glucose-Capillary: 271 mg/dL — ABNORMAL HIGH (ref 70–99)
Glucose-Capillary: 299 mg/dL — ABNORMAL HIGH (ref 70–99)
Glucose-Capillary: 304 mg/dL — ABNORMAL HIGH (ref 70–99)
Glucose-Capillary: 347 mg/dL — ABNORMAL HIGH (ref 70–99)
Glucose-Capillary: 350 mg/dL — ABNORMAL HIGH (ref 70–99)

## 2022-06-06 LAB — CBC
HCT: 37.3 % — ABNORMAL LOW (ref 39.0–52.0)
Hemoglobin: 12.1 g/dL — ABNORMAL LOW (ref 13.0–17.0)
MCH: 28.1 pg (ref 26.0–34.0)
MCHC: 32.4 g/dL (ref 30.0–36.0)
MCV: 86.7 fL (ref 80.0–100.0)
Platelets: 250 10*3/uL (ref 150–400)
RBC: 4.3 MIL/uL (ref 4.22–5.81)
RDW: 16 % — ABNORMAL HIGH (ref 11.5–15.5)
WBC: 10.9 10*3/uL — ABNORMAL HIGH (ref 4.0–10.5)
nRBC: 0 % (ref 0.0–0.2)

## 2022-06-06 LAB — BASIC METABOLIC PANEL
Anion gap: 11 (ref 5–15)
BUN: 19 mg/dL (ref 8–23)
CO2: 19 mmol/L — ABNORMAL LOW (ref 22–32)
Calcium: 8.2 mg/dL — ABNORMAL LOW (ref 8.9–10.3)
Chloride: 104 mmol/L (ref 98–111)
Creatinine, Ser: 1.35 mg/dL — ABNORMAL HIGH (ref 0.61–1.24)
GFR, Estimated: 59 mL/min — ABNORMAL LOW (ref >60)
Glucose, Bld: 315 mg/dL — ABNORMAL HIGH (ref 70–99)
Potassium: 3.6 mmol/L (ref 3.5–5.1)
Sodium: 134 mmol/L — ABNORMAL LOW (ref 135–145)

## 2022-06-06 LAB — PHOSPHORUS: Phosphorus: 2.5 mg/dL (ref 2.5–4.6)

## 2022-06-06 LAB — MAGNESIUM: Magnesium: 2 mg/dL (ref 1.7–2.4)

## 2022-06-06 LAB — MRSA NEXT GEN BY PCR, NASAL: MRSA by PCR Next Gen: NOT DETECTED

## 2022-06-06 LAB — PROCALCITONIN: Procalcitonin: 7.9 ng/mL

## 2022-06-06 MED ORDER — INSULIN ASPART 100 UNIT/ML IJ SOLN
0.0000 [IU] | Freq: Three times a day (TID) | INTRAMUSCULAR | Status: DC
  Administered 2022-06-06: 8 [IU] via SUBCUTANEOUS
  Administered 2022-06-07: 15 [IU] via SUBCUTANEOUS
  Administered 2022-06-07: 8 [IU] via SUBCUTANEOUS
  Administered 2022-06-07: 11 [IU] via SUBCUTANEOUS

## 2022-06-06 MED ORDER — IPRATROPIUM-ALBUTEROL 0.5-2.5 (3) MG/3ML IN SOLN: 3.0000 mL | RESPIRATORY_TRACT | Status: DC | PRN

## 2022-06-06 MED ORDER — INSULIN ASPART 100 UNIT/ML IJ SOLN
2.0000 [IU] | INTRAMUSCULAR | Status: DC
  Administered 2022-06-06: 2 [IU] via SUBCUTANEOUS
  Administered 2022-06-06: 4 [IU] via SUBCUTANEOUS

## 2022-06-06 MED ORDER — INSULIN ASPART 100 UNIT/ML IJ SOLN: 13.0000 [IU] | INTRAMUSCULAR | Status: DC

## 2022-06-06 MED ORDER — SPIRONOLACTONE 12.5 MG HALF TABLET
12.5000 mg | ORAL_TABLET | Freq: Every day | ORAL | Status: DC
  Administered 2022-06-06 – 2022-06-07 (×2): 12.5 mg via ORAL
  Filled 2022-06-06 (×2): qty 1

## 2022-06-06 MED ORDER — CARVEDILOL 3.125 MG PO TABS
3.1250 mg | ORAL_TABLET | Freq: Two times a day (BID) | ORAL | Status: DC
  Filled 2022-06-06: qty 1

## 2022-06-06 MED ORDER — POTASSIUM CHLORIDE CRYS ER 20 MEQ PO TBCR
40.0000 meq | EXTENDED_RELEASE_TABLET | Freq: Once | ORAL | Status: AC
  Administered 2022-06-06: 40 meq via ORAL
  Filled 2022-06-06: qty 2

## 2022-06-06 MED ORDER — INSULIN DETEMIR 100 UNIT/ML ~~LOC~~ SOLN
38.0000 [IU] | Freq: Two times a day (BID) | SUBCUTANEOUS | Status: DC
  Filled 2022-06-06: qty 0.38

## 2022-06-06 MED ORDER — INSULIN DETEMIR 100 UNIT/ML ~~LOC~~ SOLN
15.0000 [IU] | Freq: Two times a day (BID) | SUBCUTANEOUS | Status: DC
  Filled 2022-06-06: qty 0.15

## 2022-06-06 MED ORDER — CHLORHEXIDINE GLUCONATE CLOTH 2 % EX PADS
6.0000 | MEDICATED_PAD | Freq: Every day | CUTANEOUS | Status: DC
  Administered 2022-06-05: 6 via TOPICAL

## 2022-06-06 MED ORDER — INSULIN ASPART 100 UNIT/ML IJ SOLN: 2.0000 [IU] | INTRAMUSCULAR | Status: DC

## 2022-06-06 MED ORDER — DEXTROSE 10 % IV SOLN: INTRAVENOUS | Status: DC | PRN

## 2022-06-06 MED ORDER — CARVEDILOL 6.25 MG PO TABS
6.2500 mg | ORAL_TABLET | Freq: Two times a day (BID) | ORAL | Status: DC
  Administered 2022-06-06 – 2022-06-07 (×3): 6.25 mg via ORAL
  Filled 2022-06-06 (×3): qty 1

## 2022-06-06 MED ORDER — INSULIN REGULAR(HUMAN) IN NACL 100-0.9 UT/100ML-% IV SOLN
INTRAVENOUS | Status: DC
  Filled 2022-06-06: qty 100

## 2022-06-06 MED ORDER — INSULIN DETEMIR 100 UNIT/ML ~~LOC~~ SOLN
38.0000 [IU] | Freq: Two times a day (BID) | SUBCUTANEOUS | Status: DC
  Administered 2022-06-06: 38 [IU] via SUBCUTANEOUS
  Filled 2022-06-06 (×2): qty 0.38

## 2022-06-06 MED ORDER — FUROSEMIDE 10 MG/ML IJ SOLN
40.0000 mg | Freq: Once | INTRAMUSCULAR | Status: AC
  Administered 2022-06-06: 40 mg via INTRAVENOUS
  Filled 2022-06-06: qty 4

## 2022-06-06 NOTE — Assessment & Plan Note (Signed)
Calculated BMI is 33,5  

## 2022-06-06 NOTE — Progress Notes (Signed)
eLink Physician-Brief Progress Note Patient Name: Robert Sanchez DOB: Oct 23, 1956 MRN: [id number]   Date of Service  06/06/2022  HPI/Events of Note  Blood sugar 201 mg / dl, Beta Hydroxybutyric acid 0.22.  eICU Interventions  Hyperglycemia transition to SQ Insulin orders entered.        Thomasene Lot Kazden [place] 06/06/2022, 3:34 AM

## 2022-06-06 NOTE — Hospital Course (Signed)
Robert Sanchez was admitted to the hospital with the working diagnosis of acute hypoxemic respiratory failure due to multifocal pneumonia.   66 yo male with the past medical history of COPD, T2DM, CAD sp CABG, heart failure and dyslipidemia who presented with respiratory failure. Patient had chills, dry cough and dyspnea for 24 hrs, because severe symptoms EMS was called and patient was found hypoxic 60%. Patient was placed on Cpap and was transported to the ED. On his initial physical examination his blood pressure was 99/60, HR 91, RR 23, 02 saturation 94%, lungs with rales bilaterally, heart with S1 and S2 present and regular, abdomen not distended and positive lower extremity edema.   VBG 7.27/ 43.8/99/21/ sat 97% NA 132, K 5,4 cl 104 bicarbonate 20, glucose 501 bun 15 cr 1,54  BNP 640 High sensitive troponin 27 and 61  Wbc 8,5 hgb 13.3 plt 372  D dimer 1,62  SARS covid 19 positive.   Chest radiograph with mild cardiomegaly, sternotomy wires in place. Bilateral interstitial infiltrates, right upper, right lower and left lower lobe.  CT chest with no pulmonary embolism. Bilateral ground glass opacities, more predominant right lower lobe, small right pleural effusion. Increase size mediastinal nodes.   EKG 125 bpm, normal axis, normal intervals, sinus rhythm with no significant ST segment or T wave changes.   Patient was placed on non invasive mechanical ventilation Bipap and admitted to the intensive care unit where he was transitioned to high flow nasal cannula.   Placed on systemic corticosteroids, remdesivir and baricitinib.  Antibiotic therapy with ceftriaxone and azithromycin.   08/06 transferred to [facility]

## 2022-06-06 NOTE — Progress Notes (Signed)
Progress Note   Patient: Robert Sanchez YQI:[id number] DOB: 08-16-1956 DOA: 06/05/2022     1 DOS: the patient was seen and examined on 06/06/2022   Brief hospital course: Robert Sanchez was admitted to the hospital with the working diagnosis of acute hypoxemic respiratory failure due to multifocal pneumonia.   66 yo male with the past medical history of COPD, T2DM, CAD sp CABG, heart failure and dyslipidemia who presented with respiratory failure. Patient had chills, dry cough and dyspnea for 24 hrs, because severe symptoms EMS was called and patient was found hypoxic 60%. Patient was placed on Cpap and was transported to the ED. On his initial physical examination his blood pressure was 99/60, HR 91, RR 23, 02 saturation 94%, lungs with rales bilaterally, heart with S1 and S2 present and regular, abdomen not distended and positive lower extremity edema.   VBG 7.27/ 43.8/99/21/ sat 97% NA 132, K 5,4 cl 104 bicarbonate 20, glucose 501 bun 15 cr 1,54  BNP 640 High sensitive troponin 27 and 61  Wbc 8,5 hgb 13.3 plt 372  D dimer 1,62  SARS covid 19 positive.   Chest radiograph with mild cardiomegaly, sternotomy wires in place. Bilateral interstitial infiltrates, right upper, right lower and left lower lobe.  CT chest with no pulmonary embolism. Bilateral ground glass opacities, more predominant right lower lobe, small right pleural effusion. Increase size mediastinal nodes.   EKG 125 bpm, normal axis, normal intervals, sinus rhythm with no significant ST segment or T wave changes.   Patient was placed on non invasive mechanical ventilation Bipap and admitted to the intensive care unit where he was transitioned to high flow nasal cannula.   Placed on systemic corticosteroids, remdesivir and baricitinib.  Antibiotic therapy with ceftriaxone and azithromycin.   08/06 transferred to [facility]   Assessment and Plan: * Pneumonia due to COVID-19 virus Acute hypoxemic respiratory failure. Patient with  clinical improvement in his respiratory failure. Continue off bipap and tolerating well high flow nasal cannula.  RR: 22 to 29  Pulse oxymetry:98%  Fi02: 6 L/min per HFNC  Plan to continue medical therapy with remdesivir, baricitinib and dexamethasone.  Antitussive therapy, bronchodilators and airway clearing techniques.   Continue with antibiotic therapy for bacterial super infection, right lower lobe pneumonia.   Follow up inflammatory markers, cell count and cultures.  Urinary antigen for streptococcus and legionella are pending.  Check chest radiograph in am after diuresis.  Out of bed to chair tid with meals, Pt and Ot evaluation Ok to transfer to progressive care unit.     COPD with acute exacerbation (HCC) Acute exacerbation, continue with bronchodilator therapy and systemic corticosteroids.  Out of bed to chair.  Smoking cessation counseling, patient smoked one pack of cigarettes per day.   CHF (congestive heart failure) (HCC) Echocardiogram from 08/2021 with reduced LV systolic function 35 to 40%, with global hypokinesis. RV systolic function with mild reduction. Right ventricle cavity with mild dilatation.   Systolic blood pressure 120 to 150 mmHg.   Plan to continue close blood pressure monitoring Add one dose of furosemide 40 mg for acute cardiogenic pulmonary edema.  Resume carvedilol and spironolactone.    CKD (chronic kidney disease) stage 2, GFR 60-89 ml/min Hyperkalemia, hyponatremia,  Renal function with serum cr at 1,35 with K at 3,6 and serum bicarbonate at 19, Na is 134.   Plan to continue close monitoring of renal function and electrolytes.  Plan for diuresis with furosemide 40 mg x1   Type 2 diabetes  mellitus with hyperlipidemia (HCC) Uncontrolled hyperglycemia. Fasting glucose this am 315  Capillary glucose 138, 151, 146   Patient has been placed on insulin basal 38 units bid, will reduce to 15 units bid to prevent hypoglycemia.  Continue with  insulin sliding scale for glucose cover and monitoring  At home on 30 units bid of 70/30   Class 1 obesity Calculated BMI is 33,5         Subjective: Patient with improvement in dyspnea, continue with increase oxygen requirements but improved.   Physical Exam: Vitals:   06/06/22 1100 06/06/22 1200 06/06/22 1300 06/06/22 1400  BP: (!) 147/71 120/86 131/65 (!) 149/85  Pulse: 71 74 73 91  Resp: 20 (!) 23 (!) 22 (!) 29  Temp:      TempSrc:      SpO2: 98% 96% 96% 98%  Weight:      Height:       Neurology awake and alert ENT with no pallor Cardiovascular with S1 and S2 present and rhythmic with no gallops, rubs or murmurs Respiratory with rales bilaterally Abdomen soft and not tender Trace non pitting lower extremity edema  Data Reviewed:    Family Communication: no family at the bedside   Disposition: Status is: Inpatient Remains inpatient appropriate because: respiratory failure   Planned Discharge Destination: Home     Author: Coralie Keens, MD 06/06/2022 3:37 PM  For on call review [email or web address].

## 2022-06-06 NOTE — Assessment & Plan Note (Addendum)
Acute hypoxemic respiratory failure. Patient with clinical improvement in his respiratory failure. Continue off bipap and tolerating well high flow nasal cannula.  RR: 22 to 29  Pulse oxymetry:98%  Fi02: 6 L/min per HFNC  Plan to continue medical therapy with remdesivir, baricitinib and dexamethasone.  Antitussive therapy, bronchodilators and airway clearing techniques.   Continue with antibiotic therapy for bacterial super infection, right lower lobe pneumonia.   Follow up inflammatory markers, cell count and cultures.  Urinary antigen for streptococcus and legionella are pending.  Check chest radiograph in am after diuresis.  Out of bed to chair tid with meals, Pt and Ot evaluation Ok to transfer to progressive care unit.

## 2022-06-06 NOTE — Assessment & Plan Note (Signed)
Hyperkalemia, hyponatremia,  Renal function with serum cr at 1,35 with K at 3,6 and serum bicarbonate at 19, Na is 134.   Plan to continue close monitoring of renal function and electrolytes.  Plan for diuresis with furosemide 40 mg x1

## 2022-06-06 NOTE — Progress Notes (Signed)
[facility] ADULT ICU REPLACEMENT PROTOCOL   The patient does apply for the [facility] Adult ICU Electrolyte Replacment Protocol based on the criteria listed below:   1.Exclusion criteria: TCTS patients, ECMO patients, and Dialysis patients 2. Is GFR >/= 30 ml/min? Yes.    Patient's GFR today is 59 3. Is SCr </= 2? Yes.   Patient's SCr is 1.35 mg/dL 4. Did SCr increase >/= 0.5 in 24 hours? No. 5.Pt's weight >40kg  Yes.   6. Abnormal electrolyte(s): K+3.6  7. Electrolytes replaced per protocol 8.  Call MD STAT for K+ </= 2.5, Phos </= 1, or Mag </= 1 Physician:  Dr.Ogan  Lolita Lenz 06/06/2022 1:48 AM

## 2022-06-06 NOTE — Assessment & Plan Note (Addendum)
Echocardiogram from 08/2021 with reduced LV systolic function 35 to 40%, with global hypokinesis. RV systolic function with mild reduction. Right ventricle cavity with mild dilatation.   Systolic blood pressure 120 to 150 mmHg.   Plan to continue close blood pressure monitoring Add one dose of furosemide 40 mg for acute cardiogenic pulmonary edema.  Resume carvedilol and spironolactone.

## 2022-06-06 NOTE — Progress Notes (Signed)
NAME:  Robert Sanchez, MRN:  [id number], DOB:  1956-08-01, LOS: 1 ADMISSION DATE:  06/05/2022, CONSULTATION DATE:  06/05/22 REFERRING MD:  Dr. Effie Shy, CHIEF COMPLAINT:  SOB/hypoxia   History of Present Illness:   70 yoM with hx as below presenting from home with complaints of chills, dry cough, and shortness of breath for one day.  No recent sick contacts.  Previously received 2 COVID vaccines.   Found by EMS with RA sats 60%.  Placed on CPAP, treated with albuterol, nitro, and Mag 2gms with some relief and improvement in saturations to 89%.   In ER, patient afebrile, initially tachycardic and hypertensive but since progressively declining, now SBP ~100 with maps> 70.   Labs noted for K 5.4, bicarb 20, glucose 501, sCr 1.54, BNP 640, trop 27> 61, normal CBC, d-dimer 1.62, INR 1.3 and found to be COVID positive.  EKG ST with new RBBB.  ABG 7.275/ 43/ 99/ 20.  CXR with R>L pulmonary infiltrates.  CTA PE pending.  Required BiPAP in ER for ongoing hypoxia, easily desaturations and for work of breathing.   Pertinent  Medical History  Tobacco abuse, emphysema/ COPD, uncontrolled DMT2, CAD s/p CABG, systolic and diastolic HF, PVD, diabetic neuropathy, GERD, HLD, HOH   Significant Hospital Events: Including procedures, antibiotic start and stop dates in addition to other pertinent events   8/5 admitted COVID +, multifocal pna, bipap, multiple metabolic derangements.    8/5 CTA PE> 1. No acute pulmonary embolism. 2. Bibasilar consolidative hypoenhancing opacities, concerning for multifocal infection. 3. Peribronchovascular ground-glass opacities with interlobular septal thickening and small RIGHT pleural effusion. Findings could reflect superimposed pulmonary edema versus atypical infection. 4. Increase in size of multiple mediastinal lymph nodes, favored to be reactive in etiology.  Aortic Atherosclerosis  Interim History / Subjective:  Insulin drip discontinued this morning. Transition from  BiPAP to high flow nasal cannula Agitated this morning Breathing doing "okay"   Objective   Blood pressure (!) 119/91, pulse 70, temperature 98.5 F (36.9 C), temperature source Axillary, resp. rate (!) 22, height 5\' 9"  (1.753 m), weight 103.1 kg, SpO2 94 %.    Vent Mode: PSV FiO2 (%):  [40 %-50 %] 40 % PEEP:  [5 cmH20] 5 cmH20 Pressure Support:  [5 cmH20] 5 cmH20   Intake/Output Summary (Last 24 hours) at 06/06/2022 1010 Last data filed at 06/06/2022 0800 Gross per 24 hour  Intake 1353.62 ml  Output 700 ml  Net 653.62 ml    Filed Weights   06/06/22 0500  Weight: 103.1 kg    Examination:  Gen. Pleasant, obese, in no distress, normal affect ENT - no pallor,icterus, no post nasal drip, class 2 airway Neck: No JVD, no thyromegaly, no carotid bruits Lungs: no use of accessory muscles, no dullness to percussion, decreased without rales or rhonchi  Cardiovascular: Rhythm regular, heart sounds  normal, no murmurs or gallops, 1+ peripheral edema Abdomen: soft and non-tender, no hepatosplenomegaly, BS normal. Musculoskeletal: No deformities, no cyanosis or clubbing Neuro:  alert, non focal, no tremors   Chest x-ray and CT reviewed Labs show mild hyponatremia, decreasing creatinine to 1.3, mild hypokalemia and hypophosphatemia, no leukocytosis, high procalcitonin Sugars improved  Resolved Hospital Problem list    Hyperkalemia Assessment & Plan:   Acute hypoxic respiratory failure - CTA PE negative for PE  Multifocal PNA Hx COPD/ emphysema Current tobacco abuse  Maintain O2 saturations for goal SpO2 > 85% on high flow nasal cannula, I dropped to 6 L Empiric CAP coverage  with ceftriaxone/ azithro Await urine strep and legionella Duonebs, brovanna/ pulmicort  Nicotine patch prn     COVID + Continue decadron 6mg  daily for 10 days, remdesivir 5 days per pharmacy, and baricitinib  Airborne precautions  Ongoing aggressive pulmonary hygiene, progress activity as tolerated    AKI Mild NAGMA  -Monitor BMET - strict I/Os, daily wts, avoid nephrotoxins   Uncontrolled DMT2 with hyperglycemia  -Transitioned off insulin drip with Levemir 38 units every 12. -will have higher insulin requirements while on Decadron  Hx combined systolic and diastolic HF  HTN -Resume Coreg -Diuretics on hold - continue lipitor   Severe PAD - verified not taking xarelto since April 2023  Can transfer to floor and to [facility], PCCM available as needed. He needs pulmonary office follow-up in 2 to 4 weeks after acute issues resolved  Best Practice (right click and "Reselect all SmartList Selections" daily)   Diet/type: Regular consistency (see orders) DVT prophylaxis: prophylactic heparin  GI prophylaxis: PPI Lines: N/A Foley:  N/A Code Status:  full code Last date of multidisciplinary goals of care discussion [8/5]  Labs   CBC: Recent Labs  Lab 06/05/22 1259 06/05/22 1316 06/05/22 1325 06/05/22 2140 06/06/22 0019  WBC 8.5  --   --  11.3* 10.9*  NEUTROABS 6.2  --   --   --   --   HGB 13.3 13.9 14.3 11.9* 12.1*  HCT 41.6 41.0 42.0 36.3* 37.3*  MCV 88.9  --   --  86.4 86.7  PLT 372  --   --  266 250     Basic Metabolic Panel: Recent Labs  Lab 06/01/22 1034 06/05/22 1259 06/05/22 1316 06/05/22 1325 06/05/22 1452 06/05/22 1829 06/06/22 0019  NA 136 132* 131* 131*  --  134* 134*  K 4.6 5.4* >8.5* 8.4* 6.1* 4.7 3.6  CL 101 104  --  104  --  105 104  CO2 20 20*  --   --   --  21* 19*  GLUCOSE 212* 501*  --  493*  --  332* 315*  BUN 12 15  --  25*  --  19 19  CREATININE 1.14 1.54*  --  1.30*  --  1.45* 1.35*  CALCIUM 9.0 8.1*  --   --   --  8.4* 8.2*  MG  --   --   --   --   --   --  2.0  PHOS  --   --   --   --   --   --  2.5    GFR: Estimated Creatinine Clearance: 65.4 mL/min (A) (by C-G formula based on SCr of 1.35 mg/dL (H)). Recent Labs  Lab 06/05/22 1259 06/05/22 1829 06/05/22 2140 06/06/22 0019  PROCALCITON  --  6.25  --  7.90  WBC 8.5   --  11.3* 10.9*     Liver Function Tests: Recent Labs  Lab 06/01/22 1034 06/05/22 1259  AST 17 28  ALT 17 23  ALKPHOS 90 83  BILITOT 0.6 0.7  PROT 7.3 7.4  ALBUMIN 4.1 3.2*    No results for input(s): "LIPASE", "AMYLASE" in the last 168 hours. No results for input(s): "AMMONIA" in the last 168 hours.  ABG    Component Value Date/Time   PHART 7.225 (L) 11/23/2021 1024   PCO2ART 60.9 (H) 11/23/2021 1024   PO2ART 110 (H) 11/23/2021 1024   HCO3 20.4 06/05/2022 1316   TCO2 21 (L) 06/05/2022 1325   ACIDBASEDEF 6.0 (H) 06/05/2022  1316   O2SAT 97 06/05/2022 1316     Coagulation Profile: Recent Labs  Lab 06/05/22 1259  INR 1.3*     Cardiac Enzymes: No results for input(s): "CKTOTAL", "CKMB", "CKMBINDEX", "TROPONINI" in the last 168 hours.  HbA1C: HbA1c, POC (controlled diabetic range)  Date/Time Value Ref Range Status  06/01/2022 09:34 AM 9.4 (A) 0.0 - 7.0 % Final   Hgb A1c MFr Bld  Date/Time Value Ref Range Status  11/23/2021 12:50 PM 8.1 (H) 4.8 - 5.6 % Final    Comment:    (NOTE)         Prediabetes: 5.7 - 6.4         Diabetes: >6.4         Glycemic control for adults with diabetes: <7.0   08/28/2021 04:53 AM 13.3 (H) 4.8 - 5.6 % Final    Comment:    (NOTE) Pre diabetes:          5.7%-6.4%  Diabetes:              >6.4%  Glycemic control for   <7.0% adults with diabetes     CBG: Recent Labs  Lab 06/06/22 0307 06/06/22 0419 06/06/22 0544 06/06/22 0641 06/06/22 0745  GLUCAP 207* 163* 151* 138* 151*     Paitynn Mikus V. Verl [facility] 06/06/2022, 10:10 AM  See Amion for pager If no response to pager, please call PCCM consult pager After 7:00 pm call Elink

## 2022-06-06 NOTE — Assessment & Plan Note (Signed)
Uncontrolled hyperglycemia. Fasting glucose this am 315  Capillary glucose 138, 151, 146   Patient has been placed on insulin basal 38 units bid, will reduce to 15 units bid to prevent hypoglycemia.  Continue with insulin sliding scale for glucose cover and monitoring  At home on 30 units bid of 70/30

## 2022-06-06 NOTE — Assessment & Plan Note (Addendum)
Acute exacerbation, continue with bronchodilator therapy and systemic corticosteroids.  Out of bed to chair.  Smoking cessation counseling, patient smoked one pack of cigarettes per day.

## 2022-06-06 NOTE — Progress Notes (Signed)
eLink Physician-Brief Progress Note Patient Name: Robert Sanchez DOB: 04-Oct-1956 MRN: [id number]   Date of Service  06/06/2022  HPI/Events of Note  Patient admitted with positive Covid and acute exacerbation of COPD, resulting in acute on chronic hypoxemic respiratory  failure.  eICU Interventions  New Patient Evaluation.        [name] 06/06/2022, 12:31 AM

## 2022-06-07 ENCOUNTER — Other Ambulatory Visit (HOSPITAL_COMMUNITY): Payer: Self-pay

## 2022-06-07 ENCOUNTER — Inpatient Hospital Stay (HOSPITAL_COMMUNITY): Payer: PRIVATE HEALTH INSURANCE

## 2022-06-07 DIAGNOSIS — J1282 Pneumonia due to coronavirus disease 2019: Secondary | ICD-10-CM

## 2022-06-07 DIAGNOSIS — U071 COVID-19: Principal | ICD-10-CM

## 2022-06-07 LAB — GLUCOSE, CAPILLARY
Glucose-Capillary: 285 mg/dL — ABNORMAL HIGH (ref 70–99)
Glucose-Capillary: 292 mg/dL — ABNORMAL HIGH (ref 70–99)
Glucose-Capillary: 297 mg/dL — ABNORMAL HIGH (ref 70–99)
Glucose-Capillary: 303 mg/dL — ABNORMAL HIGH (ref 70–99)
Glucose-Capillary: 353 mg/dL — ABNORMAL HIGH (ref 70–99)

## 2022-06-07 LAB — CBC
HCT: 36.9 % — ABNORMAL LOW (ref 39.0–52.0)
Hemoglobin: 11.8 g/dL — ABNORMAL LOW (ref 13.0–17.0)
MCH: 27.8 pg (ref 26.0–34.0)
MCHC: 32 g/dL (ref 30.0–36.0)
MCV: 86.8 fL (ref 80.0–100.0)
Platelets: 278 10*3/uL (ref 150–400)
RBC: 4.25 MIL/uL (ref 4.22–5.81)
RDW: 16.7 % — ABNORMAL HIGH (ref 11.5–15.5)
WBC: 16.8 10*3/uL — ABNORMAL HIGH (ref 4.0–10.5)
nRBC: 0 % (ref 0.0–0.2)

## 2022-06-07 LAB — BASIC METABOLIC PANEL
Anion gap: 13 (ref 5–15)
BUN: 27 mg/dL — ABNORMAL HIGH (ref 8–23)
CO2: 22 mmol/L (ref 22–32)
Calcium: 8.6 mg/dL — ABNORMAL LOW (ref 8.9–10.3)
Chloride: 100 mmol/L (ref 98–111)
Creatinine, Ser: 1.4 mg/dL — ABNORMAL HIGH (ref 0.61–1.24)
GFR, Estimated: 56 mL/min — ABNORMAL LOW (ref >60)
Glucose, Bld: 362 mg/dL — ABNORMAL HIGH (ref 70–99)
Potassium: 4.9 mmol/L (ref 3.5–5.1)
Sodium: 135 mmol/L (ref 135–145)

## 2022-06-07 LAB — MAGNESIUM: Magnesium: 2.1 mg/dL (ref 1.7–2.4)

## 2022-06-07 LAB — URINALYSIS, ROUTINE W REFLEX MICROSCOPIC
Bilirubin Urine: NEGATIVE
Glucose, UA: 50 mg/dL — AB
Hgb urine dipstick: NEGATIVE
Ketones, ur: NEGATIVE mg/dL
Leukocytes,Ua: NEGATIVE
Nitrite: NEGATIVE
Protein, ur: NEGATIVE mg/dL
Specific Gravity, Urine: 1.015 (ref 1.005–1.030)
pH: 5 (ref 5.0–8.0)

## 2022-06-07 LAB — PHOSPHORUS: Phosphorus: 3.9 mg/dL (ref 2.5–4.6)

## 2022-06-07 LAB — HEMOGLOBIN A1C
Hgb A1c MFr Bld: 9.2 % — ABNORMAL HIGH (ref 4.8–5.6)
Mean Plasma Glucose: 217.34 mg/dL

## 2022-06-07 LAB — STREP PNEUMONIAE URINARY ANTIGEN: Strep Pneumo Urinary Antigen: NEGATIVE

## 2022-06-07 LAB — PROCALCITONIN: Procalcitonin: 5.19 ng/mL

## 2022-06-07 MED ORDER — METHYLPREDNISOLONE 4 MG PO TBPK
ORAL_TABLET | ORAL | 0 refills | Status: DC
  Filled 2022-06-07: qty 21, 6d supply, fill #0

## 2022-06-07 MED ORDER — AZITHROMYCIN 250 MG PO TABS
ORAL_TABLET | ORAL | 0 refills | Status: DC
  Filled 2022-06-07: qty 4, 4d supply, fill #0

## 2022-06-07 MED ORDER — ALBUTEROL SULFATE HFA 108 (90 BASE) MCG/ACT IN AERS
2.0000 | INHALATION_SPRAY | Freq: Four times a day (QID) | RESPIRATORY_TRACT | 2 refills | Status: DC | PRN
  Filled 2022-06-07: qty 8.5, 25d supply, fill #0

## 2022-06-07 NOTE — TOC Transition Note (Addendum)
Transition of Care [facility]) - CM/SW Discharge Note   Patient Details  Name: Robert Sanchez MRN: [id number] Date of Birth: December 10, 1955  Transition of Care [facility]) CM/SW Contact:  Harriet Masson, RN Phone Number: 06/07/2022, 2:24 PM   Clinical Narrative:    Patient stable for discharge. Orders for home 02. Spoke to [facility] and charity order accepted. Spoke to patient regarding transition needs. Patient lives alone and follows up with [facility] PCP. Patient gets his medications there as well.  Match letter reinstated.  Patient states he has transportation home. Address, Phone number and PCP verified.    Final next level of care: Home w Home Health Services Barriers to Discharge: Barriers Resolved   Patient Goals and CMS Choice Patient states their goals for this hospitalization and ongoing recovery are:: return home      Discharge Placement                 home      Discharge Plan and Services                DME Arranged: Oxygen DME Agency: [employer] Date DME Agency Contacted: 06/07/22 Time DME Agency Contacted: [phone] Representative spoke with at DME Agency: Dolanda            Social Determinants of Health (SDOH) Interventions     Readmission Risk Interventions    06/07/2022    2:19 PM 01/21/2020   12:41 PM  Readmission Risk Prevention Plan  Transportation Screening Complete Complete  PCP or Specialist Appt within 5-7 Days  Complete  PCP or Specialist Appt within 3-5 Days Complete   Home Care Screening  Complete  Medication Review (RN CM)  Complete  HRI or Home Care Consult Complete   Social Work Consult for Recovery Care Planning/Counseling Complete   Palliative Care Screening Not Applicable   Medication Review Oceanographer) Complete

## 2022-06-07 NOTE — Progress Notes (Signed)
Inpatient Diabetes Program Recommendations  AACE/ADA: New Consensus Statement on Inpatient Glycemic Control (2015)  Target Ranges:  Prepandial:   less than 140 mg/dL      Peak postprandial:   less than 180 mg/dL (1-2 hours)      Critically ill patients:  140 - 180 mg/dL   Lab Results  Component Value Date   GLUCAP 297 (H) 06/07/2022   HGBA1C 9.2 (H) 06/07/2022    Review of Glycemic Control  Latest Reference Range & Units 06/06/22 11:21 06/06/22 17:00 06/06/22 21:27 06/07/22 00:12 06/07/22 03:46 06/07/22 07:39  Glucose-Capillary 70 - 99 mg/dL 885 (H) 027 (H) 741 (H) 285 (H) 292 (H) 297 (H)   Diabetes history: DM 2 Outpatient Diabetes medications:  Trulicity 1.5 mg weekly Humulin 70/30 30 units bid Metformin 1000 mg bid  Current orders for Inpatient glycemic control:  Novolog 0-15 units tid with meals  Inpatient Diabetes Program Recommendations:    Patient was on IV insulin and transitioned too Levemir 38 units x 1- No basal insulin is currently ordered and glucose>300 mg/dL. Patient takes insulin at home and is on Decadron 6 mg daily here in the hospital.  - Recommend restarting Levemir 25 units bid and add Novolog meal coverage 6 units tid with meals (hold if patient eats less than 50% or NPO).    Thanks,  Beryl Meager, RN, BC-ADM Inpatient Diabetes Coordinator Pager [phone]  (8a-5p)

## 2022-06-07 NOTE — Progress Notes (Signed)
SATURATION QUALIFICATIONS: (This note is used to comply with regulatory documentation for home oxygen)  Patient Saturations on Room Air at Rest = 88%  Patient Saturations on Room Air while Ambulating = 88%  Patient Saturations on 3 Liters of oxygen while Ambulating = 96%

## 2022-06-07 NOTE — Discharge Summary (Signed)
Physician Discharge Summary  Robert Sanchez:[id number] DOB: 1956-08-06 DOA: 06/05/2022  PCP: Charlott Rakes, MD  Admit date: 06/05/2022 Discharge date: 06/07/2022  Admitted From: Home Disposition: Home  Recommendations for Outpatient Follow-up:  Follow up with PCP in 1-2 weeks Follow-up with pulmonology as discussed  Home Health: None Equipment/Devices: Oxygen, 3 L with exertion only  Discharge Condition: Stable CODE STATUS: Full Diet recommendation: Low-salt low-fat low-carb diet  Brief/Interim Summary: Robert Sanchez was admitted to the hospital with the working diagnosis of acute hypoxemic respiratory failure due to multifocal pneumonia.   Patient tested positive for COVID at intake with imaging also concerning for concurrent bacterial pneumonia.  Patient's hypoxia resolved drastically over the past 48 hours, he is now resting on room air without hypoxia at rest, he does require upwards of 3 L nasal cannula with exertion but otherwise remains asymptomatic today while ambulating around the room.  He is requesting discharge home which is certainly reasonable given his drastic improvement over the past 48 hours.  We will continue patient's azithromycin steroid taper as well as as needed albuterol inhaler at discharge.  Patient otherwise completed 3 days of barcitinib and Remdesivir, these will not be continued at discharge per protocol.  Patient continues to smoke tobacco as well as consume alcohol of unknown quantity.  We discussed cessation of both of these items at discharge.  Close follow-up with PCP later this week or first thing next week as scheduled.  We discussed ongoing need for COVID quarantine for a minimum 5 days, longer if he continues to have symptoms of dyspnea fevers or chills.   Discharge Diagnoses:  Principal Problem:   Pneumonia due to COVID-19 virus Active Problems:   COPD with acute exacerbation (HCC)   CHF (congestive heart failure) (HCC)   CKD (chronic kidney  disease) stage 2, GFR 60-89 ml/min   Type 2 diabetes mellitus with hyperlipidemia (HCC)   Class 1 obesity    Discharge Instructions  Discharge Instructions     For home use only DME oxygen   Complete by: As directed    Length of Need: 6 Months   Mode or (Route): Nasal cannula   Liters per Minute: 3   Frequency: Continuous (stationary and portable oxygen unit needed)   Oxygen delivery system: Gas      Allergies as of 06/07/2022       Reactions   Lisinopril Other (See Comments)   Syncope   Codeine Rash        Medication List     STOP taking these medications    oxyCODONE-acetaminophen 5-325 MG tablet Commonly known as: PERCOCET/ROXICET   Xarelto 20 MG Tabs tablet Generic drug: rivaroxaban       TAKE these medications    albuterol 108 (90 Base) MCG/ACT inhaler Commonly known as: VENTOLIN HFA Inhale 2 puffs into the lungs every 6 (six) hours as needed for wheezing or shortness of breath.   aspirin EC 81 MG tablet Take 1 tablet (81 mg total) by mouth daily. Swallow whole.   atorvastatin 80 MG tablet Commonly known as: LIPITOR Take 1 tablet (80 mg total) by mouth at bedtime.   azithromycin 250 MG tablet Commonly known as: Zithromax Take 1 tablet by mouth daily until completed   carvedilol 6.25 MG tablet Commonly known as: COREG Take 1 tablet (6.25 mg total) by mouth 2 (two) times daily with a meal.   furosemide 40 MG tablet Commonly known as: LASIX Take 1 tablet (40 mg total) by mouth daily.  gabapentin 300 MG capsule Commonly known as: NEURONTIN Take 1 capsule (300 mg total) by mouth at bedtime.   HumuLIN 70/30 (70-30) 100 UNIT/ML injection Generic drug: insulin NPH-regular Human Inject 30 units into the skin twice daily (with breakfast and dinner)   losartan 25 MG tablet Commonly known as: COZAAR Take 0.5 tablets (12.5 mg total) by mouth at bedtime.   metFORMIN 500 MG tablet Commonly known as: GLUCOPHAGE Take 2 tablets (1,000 mg total) by  mouth 2 (two) times daily with a meal.   methylPREDNISolone 4 MG Tbpk tablet Commonly known as: MEDROL DOSEPAK Taper as directed   multivitamin tablet Take 1 tablet by mouth daily.   OneTouch Delica Plus [id number] Misc Use to test blood sugars up to 4 times daily as directed.   OneTouch Verio test strip Generic drug: glucose blood Use to test blood sugars up to 4 times daily as directed.   OneTouch Verio w/Device Kit Use to test blood sugars up to 4 times daily as directed.   potassium chloride SA 20 MEQ tablet Commonly known as: KLOR-CON M Take 1 tablet (20 mEq total) by mouth daily.   spironolactone 25 MG tablet Commonly known as: ALDACTONE Take 0.5 tablets (12.5 mg total) by mouth daily.   TechLite Pen Needles 32G X 4 MM Misc Generic drug: Insulin Pen Needle Use as directed with insulin pen   TRUEplus Insulin Syringe 30G X 5/16" 1 ML Misc Generic drug: Insulin Syringe-Needle U-100 Use to inject insulin twice daily.   Trulicity 1.5 GE/9.5MW Sopn Generic drug: Dulaglutide Inject 1.5 mg into the skin once a week.   Trulicity 4.13 KG/4.0NU Sopn Generic drug: Dulaglutide Inject 0.75 mg into the skin once a week. For 4 weeks then increase to 1.5 mg weekly               Durable Medical Equipment  (From admission, onward)           Start     Ordered   06/07/22 1402  For home use only DME oxygen  Once       Question Answer Comment  Length of Need 6 Months   Mode or (Route) Nasal cannula   Liters per Minute 3   Frequency Continuous (stationary and portable oxygen unit needed)   Oxygen delivery system Gas      06/07/22 1402   06/07/22 0000  For home use only DME oxygen       Question Answer Comment  Length of Need 6 Months   Mode or (Route) Nasal cannula   Liters per Minute 3   Frequency Continuous (stationary and portable oxygen unit needed)   Oxygen delivery system Gas      06/07/22 1408            Follow-up Information     Charlott Rakes, MD Follow up in 5 day(s).   Specialty: Family Medicine Why: Hospital follow up Contact information: [place] [address] [phone]         [facility] Follow up.   Why: home oxygen from this company Contact information: [place] [address] [phone]                Allergies  Allergen Reactions   Lisinopril Other (See Comments)    Syncope   Codeine Rash    Consultations: PCCM  Procedures/Studies: DG Chest 1 View  Result Date: 06/07/2022 CLINICAL DATA:  Bibasilar disease on recent chest CT.  EXAM: CHEST  1 VIEW COMPARISON:  06/05/2022 FINDINGS: 0507 hours. Lordotic positioning. The cardio pericardial silhouette is enlarged. There is pulmonary vascular congestion without overt pulmonary edema. Diffuse interstitial opacity suggest pulmonary edema although atypical infection could have this appearance. Status post median sternotomy. Telemetry leads overlie the chest. IMPRESSION: Enlargement of the cardiopericardial silhouette with similar appearance of diffuse interstitial opacity compatible with edema or infection. Electronically Signed   By: Misty Stanley M.D.   On: 06/07/2022 07:12   CT Angio Chest PE W and/or Wo Contrast  Result Date: 06/05/2022 CLINICAL DATA:  Pulmonary embolism (PE) suspected, positive D-dimer EXAM: CT ANGIOGRAPHY CHEST WITH CONTRAST TECHNIQUE: Multidetector CT imaging of the chest was performed using the standard protocol during bolus administration of intravenous contrast. Multiplanar CT image reconstructions and MIPs were obtained to evaluate the vascular anatomy. RADIATION DOSE REDUCTION: This exam was performed according to the departmental dose-optimization program which includes automated exposure control, adjustment of the mA and/or kV according to patient size and/or use of iterative reconstruction technique. CONTRAST:  69m OMNIPAQUE IOHEXOL 350 MG/ML SOLN COMPARISON:  CT  dated June 09, 2018 FINDINGS: Cardiovascular: Satisfactory opacification of the pulmonary arteries to the segmental level. Evaluation of the bases is limited secondary to respiratory motion. No evidence of pulmonary embolism. Heart is enlarged. No pericardial effusion. Three-vessel coronary artery atherosclerotic calcifications. Status post median sternotomy and CABG. Atherosclerotic calcifications of the nonaneurysmal aorta Mediastinum/Nodes: No axillary adenopathy. There are multiple prominent mediastinal lymph nodes, increased in size in comparison to prior. Representative RIGHT paratracheal lymph node measures 11 mm in the short axis, previously 8 mm (series 7, image 57). Thyroid is unremarkable. Lungs/Pleura: Trace RIGHT pleural effusion. Bibasilar hypoenhancing consolidative opacities with diffuse peribronchovascular ground-glass opacities most pronounced in RIGHT lung and LEFT lower lobe. Relative sparing of the LEFT upper lobe. Small RIGHT pleural effusion. Interlobular septal thickening. Mild bronchial wall thickening. Upper Abdomen: No acute abnormality. Musculoskeletal: No acute osseous abnormality.  Gynecomastia. Review of the MIP images confirms the above findings. IMPRESSION: 1. No acute pulmonary embolism. 2. Bibasilar consolidative hypoenhancing opacities, concerning for multifocal infection. 3. Peribronchovascular ground-glass opacities with interlobular septal thickening and small RIGHT pleural effusion. Findings could reflect superimposed pulmonary edema versus atypical infection. 4. Increase in size of multiple mediastinal lymph nodes, favored to be reactive in etiology. Aortic Atherosclerosis (ICD10-I70.0). Electronically Signed   By: SValentino SaxonM.D.   On: 06/05/2022 17:59   DG Chest Port 1 View  Result Date: 06/05/2022 CLINICAL DATA:  Shortness of breath EXAM: PORTABLE CHEST 1 VIEW COMPARISON:  August 29, 2021 FINDINGS: Stable cardiomegaly. No pneumothorax. Diffusing bilateral  pulmonary infiltrates, right greater than left. No other acute abnormalities. IMPRESSION: Bilateral pulmonary infiltrates, right greater than left may represent asymmetric pulmonary edema. An infectious process is not excluded. Recommend clinical correlation and attention on follow-up. Electronically Signed   By: DDorise BullionIII M.D.   On: 06/05/2022 13:10     Subjective: No acute issues or events overnight, denies nausea vomiting diarrhea constipation headache fevers chills chest pain shortness of breath.   Discharge Exam: Vitals:   06/07/22 1223 06/07/22 1319  BP: (!) 144/86   Pulse: 72   Resp: 17   Temp: (!) 97.5 F (36.4 C)   SpO2: (!) 80% 96%   Vitals:   06/07/22 0923 06/07/22 0924 06/07/22 1223 06/07/22 1319  BP:   (!) 144/86   Pulse:   72   Resp:   17   Temp:   (!) 97.5 F (  36.4 C)   TempSrc:   Oral   SpO2: 95% 97% (!) 80% 96%  Weight:      Height:        General: Pt is alert, awake, not in acute distress Cardiovascular: RRR, S1/S2 +, no rubs, no gallops Respiratory: CTA bilaterally, no wheezing, no rhonchi Abdominal: Soft, NT, ND, bowel sounds + Extremities: no edema, no cyanosis    The results of significant diagnostics from this hospitalization (including imaging, microbiology, ancillary and laboratory) are listed below for reference.     Microbiology: Recent Results (from the past 240 hour(s))  Resp Panel by RT-PCR (Flu A&B, Covid) Anterior Nasal Swab     Status: Abnormal   Collection Time: 06/05/22 12:59 PM   Specimen: Anterior Nasal Swab  Result Value Ref Range Status   SARS Coronavirus 2 by RT PCR POSITIVE (A) NEGATIVE Final    Comment: (NOTE) SARS-CoV-2 target nucleic acids are DETECTED.  The SARS-CoV-2 RNA is generally detectable in upper respiratory specimens during the acute phase of infection. Positive results are indicative of the presence of the identified virus, but do not rule out bacterial infection or co-infection with other pathogens  not detected by the test. Clinical correlation with patient history and other diagnostic information is necessary to determine patient infection status. The expected result is Negative.  Fact Sheet for Patients: EntrepreneurPulse.com.au  Fact Sheet for Healthcare Providers: IncredibleEmployment.be  This test is not yet approved or cleared by the [place] FDA and  has been authorized for detection and/or diagnosis of SARS-CoV-2 by FDA under an Emergency Use Authorization (EUA).  This EUA will remain in effect (meaning this test can be used) for the duration of  the COVID-19 declaration under Section 564(b)(1) of the A ct, 21 U.S.C. section 360bbb-3(b)(1), unless the authorization is terminated or revoked sooner.     Influenza A by PCR NEGATIVE NEGATIVE Final   Influenza B by PCR NEGATIVE NEGATIVE Final    Comment: (NOTE) The Xpert Xpress SARS-CoV-2/FLU/RSV plus assay is intended as an aid in the diagnosis of influenza from Nasopharyngeal swab specimens and should not be used as a sole basis for treatment. Nasal washings and aspirates are unacceptable for Xpert Xpress SARS-CoV-2/FLU/RSV testing.  Fact Sheet for Patients: EntrepreneurPulse.com.au  Fact Sheet for Healthcare Providers: IncredibleEmployment.be  This test is not yet approved or cleared by the [place] FDA and has been authorized for detection and/or diagnosis of SARS-CoV-2 by FDA under an Emergency Use Authorization (EUA). This EUA will remain in effect (meaning this test can be used) for the duration of the COVID-19 declaration under Section 564(b)(1) of the Act, 21 U.S.C. section 360bbb-3(b)(1), unless the authorization is terminated or revoked.  Performed at [facility], [place] [address], [place], [place] [address]   MRSA Next Gen by PCR, Nasal     Status: None   Collection Time: 06/05/22 10:29 PM   Specimen: Nasal Mucosa;  Nasal Swab  Result Value Ref Range Status   MRSA by PCR Next Gen NOT DETECTED NOT DETECTED Final    Comment: (NOTE) The GeneXpert MRSA Assay (FDA approved for NASAL specimens only), is one component of a comprehensive MRSA colonization surveillance program. It is not intended to diagnose MRSA infection nor to guide or monitor treatment for MRSA infections. Test performance is not FDA approved in patients less than 69 years old. Performed at [facility], [place] [address], [place], [place] [address]      Labs: BNP (last 3 results)  Recent Labs    08/27/21 2316 09/08/21 1558 06/05/22 1259  BNP 408.5* 361.8* 810.1*   Basic Metabolic Panel: Recent Labs  Lab 06/01/22 1034 06/01/22 1034 06/05/22 1259 06/05/22 1316 06/05/22 1325 06/05/22 1452 06/05/22 1829 06/06/22 0019 06/07/22 0804  NA 136   < > 132* 131* 131*  --  134* 134* 135  K 4.6  --  5.4* >8.5* 8.4* 6.1* 4.7 3.6 4.9  CL 101  --  104  --  104  --  105 104 100  CO2 20  --  20*  --   --   --  21* 19* 22  GLUCOSE 212*  --  501*  --  493*  --  332* 315* 362*  BUN 12  --  15  --  25*  --  19 19 27*  CREATININE 1.14  --  1.54*  --  1.30*  --  1.45* 1.35* 1.40*  CALCIUM 9.0  --  8.1*  --   --   --  8.4* 8.2* 8.6*  MG  --   --   --   --   --   --   --  2.0 2.1  PHOS  --   --   --   --   --   --   --  2.5 3.9   < > = values in this interval not displayed.   Liver Function Tests: Recent Labs  Lab 06/01/22 1034 06/05/22 1259  AST 17 28  ALT 17 23  ALKPHOS 90 83  BILITOT 0.6 0.7  PROT 7.3 7.4  ALBUMIN 4.1 3.2*   No results for input(s): "LIPASE", "AMYLASE" in the last 168 hours. No results for input(s): "AMMONIA" in the last 168 hours. CBC: Recent Labs  Lab 06/05/22 1259 06/05/22 1316 06/05/22 1325 06/05/22 2140 06/06/22 0019 06/07/22 0804  WBC 8.5  --   --  11.3* 10.9* 16.8*  NEUTROABS 6.2  --   --   --   --   --   HGB 13.3 13.9 14.3 11.9* 12.1* 11.8*  HCT 41.6 41.0 42.0 36.3* 37.3* 36.9*  MCV 88.9   --   --  86.4 86.7 86.8  PLT 372  --   --  266 250 278   Cardiac Enzymes: No results for input(s): "CKTOTAL", "CKMB", "CKMBINDEX", "TROPONINI" in the last 168 hours. BNP: Invalid input(s): "POCBNP" CBG: Recent Labs  Lab 06/07/22 0012 06/07/22 0346 06/07/22 0739 06/07/22 1331 06/07/22 1639  GLUCAP 285* 292* 297* 303* 353*   D-Dimer Recent Labs    06/05/22 1259  DDIMER 1.62*   Hgb A1c Recent Labs    06/07/22 0804  HGBA1C 9.2*   Lipid Profile No results for input(s): "CHOL", "HDL", "LDLCALC", "TRIG", "CHOLHDL", "LDLDIRECT" in the last 72 hours. Thyroid function studies No results for input(s): "TSH", "T4TOTAL", "T3FREE", "THYROIDAB" in the last 72 hours.  Invalid input(s): "FREET3" Anemia work up No results for input(s): "VITAMINB12", "FOLATE", "FERRITIN", "TIBC", "IRON", "RETICCTPCT" in the last 72 hours. Urinalysis    Component Value Date/Time   COLORURINE YELLOW 06/07/2022 0513   APPEARANCEUR CLEAR 06/07/2022 0513   LABSPEC 1.015 06/07/2022 0513   PHURINE 5.0 06/07/2022 0513   GLUCOSEU 50 (A) 06/07/2022 0513   HGBUR NEGATIVE 06/07/2022 0513   BILIRUBINUR NEGATIVE 06/07/2022 0513   KETONESUR NEGATIVE 06/07/2022 0513   PROTEINUR NEGATIVE 06/07/2022 0513   NITRITE NEGATIVE 06/07/2022 0513   LEUKOCYTESUR NEGATIVE 06/07/2022 0513   Sepsis Labs Recent Labs  Lab 06/05/22 1259 06/05/22 2140  06/06/22 0019 06/07/22 0804  WBC 8.5 11.3* 10.9* 16.8*   Microbiology Recent Results (from the past 240 hour(s))  Resp Panel by RT-PCR (Flu A&B, Covid) Anterior Nasal Swab     Status: Abnormal   Collection Time: 06/05/22 12:59 PM   Specimen: Anterior Nasal Swab  Result Value Ref Range Status   SARS Coronavirus 2 by RT PCR POSITIVE (A) NEGATIVE Final    Comment: (NOTE) SARS-CoV-2 target nucleic acids are DETECTED.  The SARS-CoV-2 RNA is generally detectable in upper respiratory specimens during the acute phase of infection. Positive results are indicative of the  presence of the identified virus, but do not rule out bacterial infection or co-infection with other pathogens not detected by the test. Clinical correlation with patient history and other diagnostic information is necessary to determine patient infection status. The expected result is Negative.  Fact Sheet for Patients: EntrepreneurPulse.com.au  Fact Sheet for Healthcare Providers: IncredibleEmployment.be  This test is not yet approved or cleared by the [place] FDA and  has been authorized for detection and/or diagnosis of SARS-CoV-2 by FDA under an Emergency Use Authorization (EUA).  This EUA will remain in effect (meaning this test can be used) for the duration of  the COVID-19 declaration under Section 564(b)(1) of the A ct, 21 U.S.C. section 360bbb-3(b)(1), unless the authorization is terminated or revoked sooner.     Influenza A by PCR NEGATIVE NEGATIVE Final   Influenza B by PCR NEGATIVE NEGATIVE Final    Comment: (NOTE) The Xpert Xpress SARS-CoV-2/FLU/RSV plus assay is intended as an aid in the diagnosis of influenza from Nasopharyngeal swab specimens and should not be used as a sole basis for treatment. Nasal washings and aspirates are unacceptable for Xpert Xpress SARS-CoV-2/FLU/RSV testing.  Fact Sheet for Patients: EntrepreneurPulse.com.au  Fact Sheet for Healthcare Providers: IncredibleEmployment.be  This test is not yet approved or cleared by the [place] FDA and has been authorized for detection and/or diagnosis of SARS-CoV-2 by FDA under an Emergency Use Authorization (EUA). This EUA will remain in effect (meaning this test can be used) for the duration of the COVID-19 declaration under Section 564(b)(1) of the Act, 21 U.S.C. section 360bbb-3(b)(1), unless the authorization is terminated or revoked.  Performed at [facility], [place] [address], [place], [place] [address]    MRSA Next Gen by PCR, Nasal     Status: None   Collection Time: 06/05/22 10:29 PM   Specimen: Nasal Mucosa; Nasal Swab  Result Value Ref Range Status   MRSA by PCR Next Gen NOT DETECTED NOT DETECTED Final    Comment: (NOTE) The GeneXpert MRSA Assay (FDA approved for NASAL specimens only), is one component of a comprehensive MRSA colonization surveillance program. It is not intended to diagnose MRSA infection nor to guide or monitor treatment for MRSA infections. Test performance is not FDA approved in patients less than 55 years old. Performed at [facility], [place] [address] [place], [place] [address]      Time coordinating discharge: Over 30 minutes  SIGNED:   Little Ishikawa, DO [facility] 06/07/2022, 4:44 PM Pager   If 7PM-7AM, please contact night-coverage [email or web address]

## 2022-06-07 NOTE — Progress Notes (Signed)
Mr. Civello is alert and oriented x4. No complaints of pain. Removed PIV with no issues, continues on 3L of oxygen via [place]. Educated pt on discharge instructions and how to use oxygen concentrator and oxygen tank. Verbal demonstration returned and accurate. Medication from [facility] given to pt. Pt called for transportation from hospital.

## 2022-06-07 NOTE — Progress Notes (Signed)
TOC following patient for high risk of readmission. Patient is admitted for + covid. TOC will continue to follow for needs.

## 2022-06-08 ENCOUNTER — Telehealth: Payer: Self-pay

## 2022-06-08 LAB — LEGIONELLA PNEUMOPHILA SEROGP 1 UR AG: L. pneumophila Serogp 1 Ur Ag: NEGATIVE

## 2022-06-08 NOTE — Telephone Encounter (Signed)
Transition Care Management Unsuccessful Follow-up Telephone Call  Date of discharge and from where:  06/07/2022, [facility]  Attempts:  1st Attempt  Reason for unsuccessful TCM follow-up call:  Voice mail full  # [phone]

## 2022-06-08 NOTE — Telephone Encounter (Signed)
From the discharge call:   He said he is not feeling too bad. He said he understands he is to remain quarantined for 5 days and then to wear a mask when he is around others after that. If he is symptomatic,he is to remain quarantined longer.   he said he has all medications and did not have any questions about the med regime   He has been using his O2 @ 3L most of the time since he has been home. Independent with ADLs.   He said he just saw Dr Alvis Lemmings and will call to schedule a appointment when he is ready. He may request a virtual appointment

## 2022-06-08 NOTE — Telephone Encounter (Signed)
Transition Care Management Follow-up Telephone Call Date of discharge and from where: 06/07/2022, [facility]  How have you been since you were released from the hospital? He said he is not feeling too bad. He said he understands he is to remain quarantined for 5 days and then to wear a mask when he is around others after that. If he is symptomatic,he is to remain quarantined longer.  Any questions or concerns? No  Items Reviewed: Did the pt receive and understand the discharge instructions provided? Yes  Medications obtained and verified? Yes - he said he has all medications and did not have any questions about the med regime  Other? No  Any new allergies since your discharge? No  Dietary orders reviewed? Yes Do you have support at home? No - he said he lives alone, no family or support in the area.   Home Care and Equipment/Supplies: Were home health services ordered? no If so, what is the name of the agency? N/a  Has the agency set up a time to come to the patient's home? not applicable Were any new equipment or medical supplies ordered?  Yes: O2 What is the name of the medical supply agency? [employer] [facility] Were you able to get the supplies/equipment? yes Do you have any questions related to the use of the equipment or supplies? No  Functional Questionnaire: (I = Independent and D = Dependent) ADLs: independent. Has walker to use if needed. He has been using his O2 @ 3L most of the time since he has been home.    Follow up appointments reviewed:  PCP Hospital f/u appt confirmed?  He said he just saw Dr Alvis Lemmings and will call to schedule a appointment when he is ready. He may request a virtual appointment    Specialist Hospital f/u appt confirmed?  None scheduled at this time    Are transportation arrangements needed?  He said he usually has a friend drive him to appointments. I explained if he needs a ride to an appointment at this clinic to please let us know and we can arrange a  ride for him,  If their condition worsens, is the pt aware to call PCP or go to the Emergency Dept.? Yes Was the patient provided with contact information for the PCP's office or ED? Yes Was to pt encouraged to call back with questions or concerns? Yes

## 2022-06-18 ENCOUNTER — Ambulatory Visit (HOSPITAL_COMMUNITY): Payer: Self-pay

## 2022-07-07 NOTE — Progress Notes (Signed)
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 male who presents for diabetes evaluation, education, and management. PMH is significant for CAD s/p CAGB x3,PAD, PVD, CHF, HTN, T2DM, CKD stage IIIa, MI and obesity.   Patient was referred and last seen by Primary Care Provider, Dr. Alvis Lemmings, on 06/01/22. At that visit, his A1c was 9.4%. Pt admitted to medication nonadherence as he ran out of his medications for an unknown period of time. At that visit, metformin dose was increased to 2 g daily and he was restarted on Trulicity with instructions to start at 0.75mg  once a week and increase in 4 weeks to 1.5mg .  A few days after this visit on 8/7, he was hospitalized for acute hypoxemic respiratory failure due to multifocal pneumonia and tested positive for COVID. While inpatient, he received high dose IV steroids and was discharged on a Medrol dose pack.  Today, patient arrives in good spirits and presents without any assistance. Patient states he does not always smoke or drink. He will normally smoke a cigarette, "light up a joint," and drink a beer a few evenings every week. He states when he was younger, he would drink a full six pack daily as well as smoke more consistently.    Family/Social History:  -Family Hx: -Tobacco: endorses -Alcohol: endorses -Illicit drugs: endorses  Current diabetes medications include: Trulicity 0.75mg  once a week, Humulin 70/30 30 units BID, metformin 500mg  2 tablets BID Current hypertension medications include: carvedilol 6.25 mg BID, furosemide 40mg  once daily, losartan 25mg  1/2 tablet once daily, spironolactone 25mg  1/2 tablet once daily Current hyperlipidemia medications include: atorvastatin 80mg  once daily  Patient denies hypoglycemic events. Low of 150-160 He states he has not seen any BG readings out of the 200-300s for as long as he can remember. He ran out of his Trulicity about 2 weeks ago and did not pick up the higher dose from the pharmacy.  He ran out of his insulin 2-3 days ago. Vocalized some concern with his metformin therapy, stating someone had mentioned it can "harm his kidneys." Otherwise, he previously was adherent to his medications.  Patient-reported exercise habits: none reported   O:  Lab Results  Component Value Date   HGBA1C 9.2 (H) 06/07/2022    Lipid Panel     Component Value Date/Time   CHOL 88 11/24/2021 0219   CHOL 151 06/27/2020 1216   TRIG 99 11/24/2021 0219   HDL 23 (L) 11/24/2021 0219   HDL 27 (L) 06/27/2020 1216   CHOLHDL 3.8 11/24/2021 0219   VLDL 20 11/24/2021 0219   LDLCALC 45 11/24/2021 0219   LDLCALC 100 (H) 06/27/2020 1216   A/P: Diabetes longstanding currently uncontrolled. Patient is able to verbalize appropriate hypoglycemia management plan. Medication adherence appears suboptimal due to some confusion with his regimen, running out of medications, dietary indiscretions, and recent steroid use. -Started once daily basal insulin - Basaglar 42 units once daily. -Discontinued Humulin insulin -Increased dose of GLP-1 Trulicity to 1.5mg  once a week. Encouraged patient to pick up this increased dose at the pharmacy  -Continued metformin 500mg , 2 tablets BID. Counseled on the cardiovascular benefit of this medication and addressed concerns with renal toxicities. -Patient educated on purpose, proper use, and potential adverse effects of Basaglar.  -Extensively discussed pathophysiology of diabetes, recommended lifestyle interventions, dietary effects on blood sugar control.  -Counseled on s/sx of and management of hypoglycemia.  -Next A1c anticipated 09/2022.   ASCVD risk -  secondary prevention in patient with diabetes. Last LDL is at goal of <70 mg/dL. High intensity statin indicated.  -Continued atorvastatin 80 mg.   Hypertension longstanding  currently controlled. Blood pressure goal of <130/80  mmHg. Medication adherence appropriate.  -Continue carvedilol 6.25 mg BID -Continue  furosemide 40mg  once daily -Continue losartan 25mg  1/2 tablet once daily -Continue spironolactone 25mg  1/2 tablet once daily  Written patient instructions provided. Patient verbalized understanding of treatment plan.  Total time in face to face counseling 30 minutes.    Follow-up:  Pharmacist October. PCP clinic visit in November.   , PharmD PGY-1 [facility] Pharmacy Resident

## 2022-07-08 ENCOUNTER — Ambulatory Visit: Payer: Self-pay | Attending: Family Medicine | Admitting: Pharmacist

## 2022-07-08 ENCOUNTER — Other Ambulatory Visit: Payer: Self-pay

## 2022-07-08 DIAGNOSIS — I152 Hypertension secondary to endocrine disorders: Secondary | ICD-10-CM

## 2022-07-08 DIAGNOSIS — E1159 Type 2 diabetes mellitus with other circulatory complications: Secondary | ICD-10-CM

## 2022-07-08 DIAGNOSIS — E1165 Type 2 diabetes mellitus with hyperglycemia: Secondary | ICD-10-CM

## 2022-07-08 MED ORDER — ALBUTEROL SULFATE HFA 108 (90 BASE) MCG/ACT IN AERS
INHALATION_SPRAY | RESPIRATORY_TRACT | 1 refills | Status: DC
  Filled 2022-07-08: qty 6.7, 25d supply, fill #0
  Filled 2022-10-22: qty 6.7, 25d supply, fill #1

## 2022-07-08 MED ORDER — BASAGLAR KWIKPEN 100 UNIT/ML ~~LOC~~ SOPN
42.0000 [IU] | PEN_INJECTOR | Freq: Every day | SUBCUTANEOUS | 2 refills | Status: DC
  Filled 2022-07-08: qty 15, 35d supply, fill #0
  Filled 2022-08-27: qty 15, 35d supply, fill #1
  Filled 2022-09-30: qty 15, 35d supply, fill #2

## 2022-07-09 ENCOUNTER — Other Ambulatory Visit: Payer: Self-pay

## 2022-08-27 ENCOUNTER — Other Ambulatory Visit: Payer: Self-pay

## 2022-09-07 ENCOUNTER — Other Ambulatory Visit: Payer: Self-pay | Admitting: Pharmacist

## 2022-09-07 ENCOUNTER — Encounter: Payer: Self-pay | Admitting: Family Medicine

## 2022-09-07 ENCOUNTER — Other Ambulatory Visit: Payer: Self-pay

## 2022-09-07 ENCOUNTER — Ambulatory Visit: Payer: Self-pay | Attending: Family Medicine | Admitting: Family Medicine

## 2022-09-07 VITALS — BP 112/69 | HR 79 | Ht 69.0 in | Wt 224.6 lb

## 2022-09-07 DIAGNOSIS — E1159 Type 2 diabetes mellitus with other circulatory complications: Secondary | ICD-10-CM

## 2022-09-07 DIAGNOSIS — I152 Hypertension secondary to endocrine disorders: Secondary | ICD-10-CM

## 2022-09-07 DIAGNOSIS — E1149 Type 2 diabetes mellitus with other diabetic neurological complication: Secondary | ICD-10-CM

## 2022-09-07 DIAGNOSIS — I25810 Atherosclerosis of coronary artery bypass graft(s) without angina pectoris: Secondary | ICD-10-CM

## 2022-09-07 DIAGNOSIS — Z1211 Encounter for screening for malignant neoplasm of colon: Secondary | ICD-10-CM

## 2022-09-07 DIAGNOSIS — E1165 Type 2 diabetes mellitus with hyperglycemia: Secondary | ICD-10-CM

## 2022-09-07 DIAGNOSIS — I739 Peripheral vascular disease, unspecified: Secondary | ICD-10-CM

## 2022-09-07 DIAGNOSIS — Z23 Encounter for immunization: Secondary | ICD-10-CM

## 2022-09-07 LAB — POCT GLYCOSYLATED HEMOGLOBIN (HGB A1C): HbA1c, POC (controlled diabetic range): 9 % — AB (ref 0.0–7.0)

## 2022-09-07 LAB — GLUCOSE, POCT (MANUAL RESULT ENTRY): POC Glucose: 147 mg/dl — AB (ref 70–99)

## 2022-09-07 MED ORDER — GABAPENTIN 300 MG PO CAPS
600.0000 mg | ORAL_CAPSULE | Freq: Every day | ORAL | 6 refills | Status: DC
  Filled 2022-09-07 – 2022-09-30 (×2): qty 60, 30d supply, fill #0
  Filled 2023-02-02: qty 60, 30d supply, fill #1
  Filled 2023-05-11: qty 60, 30d supply, fill #2

## 2022-09-07 MED ORDER — LOSARTAN POTASSIUM 25 MG PO TABS
12.5000 mg | ORAL_TABLET | Freq: Every day | ORAL | 3 refills | Status: DC
  Filled 2022-09-07: qty 45, 90d supply, fill #0
  Filled 2022-11-19: qty 15, 30d supply, fill #0
  Filled 2022-12-31: qty 15, 30d supply, fill #1
  Filled 2023-02-02: qty 15, 30d supply, fill #2
  Filled 2023-03-24: qty 15, 30d supply, fill #3
  Filled 2023-05-11: qty 15, 30d supply, fill #4

## 2022-09-07 MED ORDER — TRULICITY 3 MG/0.5ML ~~LOC~~ SOAJ
3.0000 mg | SUBCUTANEOUS | 6 refills | Status: DC
  Filled 2022-09-07: qty 2, 28d supply, fill #0

## 2022-09-07 MED ORDER — SEMAGLUTIDE (1 MG/DOSE) 4 MG/3ML ~~LOC~~ SOPN
1.0000 mg | PEN_INJECTOR | SUBCUTANEOUS | 0 refills | Status: DC
  Filled 2022-09-07 – 2022-09-30 (×2): qty 3, 28d supply, fill #0

## 2022-09-07 NOTE — Patient Instructions (Signed)

## 2022-09-07 NOTE — Progress Notes (Signed)
Subjective:  Patient ID: Robert Sanchez, male    DOB: 12/13/1955  Age: 66 y.o. MRN: [id number]  CC: Diabetes   HPI Robert Sanchez is a 66 y.o. year old male with a history of Type 2 DM (A1c 9.4) , CAD s/p CABG x3, PVD status post thrombectomy of left common femoral to PT composite bypass, with retrograde iliac angioplasty and revision of distal bypass to below the knee popliteal artery/TP trunk with a bovine pericardial patch angioplasty, subsequent thrombosis and angioplasty of distal anastomosis, partial L big toe amputation in 07/2020.  In 11/2021 he did undergo left femoral endarterectomy with profundoplasty bovine patch and a left common femoral to anterior tibial bypass with composite PTFE and great saphenous vein, on 3L oxygen (at night since 06/2022).  Interval History: He has been on nocturnal Oxygen since he had COVID in 06/2022 and he states it has improved his quality of sleep. He has no dyspnea during the day. He was also prescribed an MDI which he uses as needed.  He has neuropathic pain in his legs  which is uncontrolled and spreading from his feet to his legs. He is on Gabapentin for this and did notice some daytime somnolence when he took it in the daytime. With regards to his diabetes mellitus his A1c has not changed much and is 9.0 down from 9.2.  He endorses adherence with his Trulicity, metformin and Lantus.  He did have a visit with the clinical pharmacist 2 months ago at which time his Trulicity regimen was adjusted. States he is working on adhering to a diabetic diet.  He has no chest pain, dyspnea, palpitations or lightheadedness. Denies additional concerns. Past Medical History:  Diagnosis Date   Acute pulmonary edema ([place]) 06/05/2018   Arthritis    CHF (congestive heart failure) (HCC)    COPD (chronic obstructive pulmonary disease) ([place])    " beginning stages " per pt   Coronary artery disease    Diabetes mellitus without complication ([place])    type 2    Diabetic neuropathy (HCC)    Diabetic neuropathy ([place])    Emphysema/COPD ([place])    " beginning stages"   GERD (gastroesophageal reflux disease)    HLD (hyperlipidemia)    HOH (hard of hearing)    no hearing aids   Myocardial infarction [facility])    Peripheral vascular disease ([place])     Past Surgical History:  Procedure Laterality Date   ABDOMINAL AORTOGRAM W/LOWER EXTREMITY N/A 06/21/2018   Procedure: ABDOMINAL AORTOGRAM W/LOWER EXTREMITY;  Surgeon: [name], MD;  Location: [place] CV [facility];  Service: Cardiovascular;  Laterality: N/A;   ABDOMINAL AORTOGRAM W/LOWER EXTREMITY Left 01/16/2020   Procedure: ABDOMINAL AORTOGRAM W/LOWER EXTREMITY;  Surgeon: [name], MD;  Location: [place] CV [facility];  Service: Cardiovascular;  Laterality: Left;   ABDOMINAL AORTOGRAM W/LOWER EXTREMITY N/A 07/09/2020   Procedure: ABDOMINAL AORTOGRAM W/LOWER EXTREMITY;  Surgeon: [name], MD;  Location: [place] [facility];  Service: Cardiovascular;  Laterality: N/A;  TPA infusion   ABDOMINAL AORTOGRAM W/LOWER EXTREMITY Bilateral 11/20/2021   Procedure: ABDOMINAL AORTOGRAM W/LOWER EXTREMITY;  Surgeon: Cherre Robins, MD;  Location: [place] CV [facility];  Service: Cardiovascular;  Laterality: Bilateral;   AMPUTATION Left 08/04/2020   Procedure: LEFT PARTIAL GREAT TOE AMPUTATION;  Surgeon: [name], MD;  Location: [place];  Service: Vascular;  Laterality: Left;   ANGIOPLASTY Left 11/23/2021   Procedure: ANGIOPLASTY with BOVINE PATCH 1cmx6cm;  Surgeon: [name],  MD;  Location: [place];  Service: Vascular;  Laterality: Left;   APPLICATION OF WOUND VAC Left 08/04/2020   Procedure: LEFT GROIN DEBRIDEMENT WITH APPLICATION OF WOUND [place];  Surgeon: [name], MD;  Location: [place];  Service: Vascular;  Laterality: Left;   CORONARY ARTERY BYPASS GRAFT N/A 06/12/2018   Procedure: CORONARY ARTERY BYPASS GRAFTING (CABG) x 3 WITH ENDOSCOPIC HARVESTING OF RIGHT GREATER  SAPHENOUS VEIN. LIMA TO LAD. SVG TO PD. SVG TO DIAGONAL.;  Surgeon: Ivin Poot, MD;  Location: [place];  Service: Open Heart Surgery;  Laterality: N/A;   ENDARTERECTOMY FEMORAL Left 11/23/2021   Procedure: ENDARTERECTOMY FEMORAL;  Surgeon: [name], MD;  Location: [place];  Service: Vascular;  Laterality: Left;   FEMORAL-POPLITEAL BYPASS GRAFT Left 07/10/2020   Procedure: REDO EXPOSURE OF LEFT COMMON FEMORAL ARTERY LEFT COMMON FEMORAL TO POSTERIOR TIBIAL COMPOSITE BYPASS GRAFT;  Surgeon: [name], MD;  Location: [place];  Service: Vascular;  Laterality: Left;   FEMORAL-TIBIAL BYPASS GRAFT Left 08/21/2018   Procedure: BYPASS GRAFT LEFT FEMORAL TO POSTERIOR TIBIAL ARTERY USING LEFT REVERSED GREAT SAPHENOUS VEIN;  Surgeon: [name], MD;  Location: [place];  Service: Vascular;  Laterality: Left;   FEMORAL-TIBIAL BYPASS GRAFT Left 08/04/2020   Procedure: REVISION FEMORAL-DISTAL BYPASS WITH BIOLOGICAL BOVINE PATCH;  Surgeon: [name], MD;  Location: [place];  Service: Vascular;  Laterality: Left;   FEMORAL-TIBIAL BYPASS GRAFT Left 11/23/2021   Procedure: REDO LEFT FEMORAL-TIBIAL ARTERY BYPASS GRAFT USING PROPATEN 60m VASCULAR GRAFT REMOVABLE RKim  Surgeon: CMarty Heck MD;  [place]  Service: Vascular;  Laterality: Left;  Insert arterial line   KNEE ARTHROSCOPY     LOWER EXTREMITY ANGIOGRAM Left 08/04/2020   Procedure: LEFT ILIAC ANGIOGRAM, ANGIOPLASTY OF LEFT ILIAC STENT WITH DRUG COATED BALLOON;  Surgeon: CMarty Heck MD;  [place]  Service: Vascular;  Laterality: Left;   LOWER EXTREMITY ANGIOGRAPHY Left 01/17/2020   Procedure: Lower Extremity Angiography;  Surgeon: CMarty Heck MD;  [place];  Service: Cardiovascular;  Laterality: Left;   LOWER EXTREMITY ANGIOGRAPHY N/A 01/07/2021   Procedure: LOWER EXTREMITY ANGIOGRAPHY;  Surgeon: HCherre Robins MD;  [place];  Service:  Cardiovascular;  Laterality: N/A;   PERIPHERAL VASCULAR BALLOON ANGIOPLASTY Left 01/17/2020   Procedure: PERIPHERAL VASCULAR BALLOON ANGIOPLASTY;  Surgeon: CMarty Heck MD;  [place];  Service: Cardiovascular;  Laterality: Left;  pt   PERIPHERAL VASCULAR INTERVENTION Left 06/21/2018   Procedure: PERIPHERAL VASCULAR INTERVENTION;  Surgeon: CMarty Heck MD;  [place];  Service: Cardiovascular;  Laterality: Left;   PERIPHERAL VASCULAR INTERVENTION Left 01/08/2021   Procedure: PERIPHERAL VASCULAR INTERVENTION;  Surgeon: CMarty Heck MD;  [place];  Service: Cardiovascular;  Laterality: Left;   PERIPHERAL VASCULAR THROMBECTOMY Left 01/16/2020   Procedure: PERIPHERAL VASCULAR THROMBECTOMY;  Surgeon: CMarty Heck MD;  [place];  Service: Cardiovascular;  Laterality: Left;  Lytic Catheter Placement left fem-pop bypass   PERIPHERAL VASCULAR THROMBECTOMY Left 01/17/2020   Procedure: PERIPHERAL VASCULAR THROMBECTOMY;  Surgeon: CMarty Heck MD;  [place];  Service: Cardiovascular;  Laterality: Left;  fem-pt bypass   PERIPHERAL VASCULAR THROMBECTOMY Left 07/10/2020   Procedure: lysis recheck;  Surgeon: CMarty Heck MD;  [place];  Service: Cardiovascular;  Laterality: Left;   PULMONARY THROMBECTOMY N/A 01/08/2021   Procedure:  LYSIS RECHECK;  Surgeon: [name], MD;  Location: [place] [facility];  Service: Cardiovascular;  Laterality: N/A;   RIGHT/LEFT HEART CATH AND CORONARY ANGIOGRAPHY N/A 06/05/2018   Procedure: RIGHT/LEFT HEART CATH AND CORONARY ANGIOGRAPHY;  Surgeon: Dixie Dials, MD;  Location: [place] [facility];  Service: Cardiovascular;  Laterality: N/A;   SPINE SURGERY     TEE WITHOUT CARDIOVERSION N/A 06/12/2018   Procedure: TRANSESOPHAGEAL ECHOCARDIOGRAM (TEE);  Surgeon: Prescott Gum, Collier Salina, MD;  Location: [place];  Service: Open  Heart Surgery;  Laterality: N/A;   teeth extractions     THROMBECTOMY FEMORAL ARTERY Left 08/04/2020   Procedure: THROMBECTOMY OF LEFT FEMORAL TP COMPOSTITE BYPASS;  Surgeon: [name], MD;  Location: [place];  Service: Vascular;  Laterality: Left;   THROMBECTOMY ILIAC ARTERY Left 07/10/2020   Procedure: THROMBECTOMY OF LEFT EXTERNAL ILIAC AND LEFT COMMON FEMORAL AND LEFT POSTERIOR TIBIAL ARTERIES;  Surgeon: [name], MD;  Location: [place];  Service: Vascular;  Laterality: Left;   ULTRASOUND GUIDANCE FOR VASCULAR ACCESS Bilateral 11/23/2021   Procedure: ULTRASOUND GUIDANCE FOR VASCULAR ACCESS;  Surgeon: [name], MD;  Location: [place];  Service: Vascular;  Laterality: Bilateral;   VEIN HARVEST Left 08/21/2018   Procedure: VEIN HARVEST LEFT GREAT SAPHENOUS VEIN;  Surgeon: [name], MD;  Location: [place];  Service: Vascular;  Laterality: Left;   VEIN HARVEST Right 11/23/2021   Procedure: VEIN HARVEST;  Surgeon: [name], MD;  Location: [place];  Service: Vascular;  Laterality: Right;    Family History  Problem Relation Age of Onset   Diabetes Mother    Lung disease Mother    Cancer Father    Heart disease Father     Social History   Socioeconomic History   Marital status: Single    Spouse name: Not on file   Number of children: 1   Years of education: Not on file   Highest education level: 10th grade  Occupational History   Not on file  Tobacco Use   Smoking status: Some Days    Packs/day: 1.00    Years: 50.00    Total pack years: 50.00    Types: Cigarettes   Smokeless tobacco: Never   Tobacco comments:    1PPD x 50 years. 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   Vaping Use   Vaping Use: Never used  Substance and Sexual Activity   Alcohol use: Yes    Comment: occasional   Drug use: Yes    Types: Marijuana    Comment: Smokes Marijuana once a week   Sexual activity: Not on file  Other Topics Concern   Not on file   Social History Narrative   Not on file   Social Determinants of Health   Financial Resource Strain: Low Risk  (08/31/2021)   Overall Financial Resource Strain (CARDIA)    Difficulty of Paying Living Expenses: Not very hard  Food Insecurity: No Food Insecurity (08/31/2021)   Hunger Vital Sign    Worried About Running Out of Food in the Last Year: Never true    Ran Out of Food in the Last Year: Never true  Transportation Needs: No Transportation Needs (08/31/2021)   PRAPARE - Hydrologist (Medical): No    Lack of Transportation (Non-Medical): No  Physical Activity: Not on file  Stress: Not on file  Social Connections: Not on file    Allergies  Allergen Reactions   Lisinopril Other (  See Comments)    Syncope   Codeine Rash    Outpatient Medications Prior to Visit  Medication Sig Dispense Refill   albuterol (VENTOLIN HFA) 108 (90 Base) MCG/ACT inhaler Inhale 2 puffs into the lungs every 6 hours as needed for wheezing or shortness of breath. 6.7 g 1   aspirin 81 MG EC tablet Take 1 tablet (81 mg total) by mouth daily. Swallow whole.     atorvastatin (LIPITOR) 80 MG tablet Take 1 tablet (80 mg total) by mouth at bedtime. 30 tablet 6   Blood Glucose Monitoring Suppl (ONETOUCH VERIO) w/Device KIT Use to test blood sugars up to 4 times daily as directed. 1 kit 0   carvedilol (COREG) 6.25 MG tablet Take 1 tablet (6.25 mg total) by mouth 2 (two) times daily with a meal. 60 tablet 5   furosemide (LASIX) 40 MG tablet Take 1 tablet (40 mg total) by mouth daily. 30 tablet 6   glucose blood (ONETOUCH VERIO) test strip Use to test blood sugars up to 4 times daily as directed. 100 each 2   Insulin Glargine (BASAGLAR KWIKPEN) 100 UNIT/ML Inject 42 Units into the skin daily. 15 mL 2   Insulin Pen Needle 32G X 4 MM MISC Use as directed with insulin pen 200 each 0   Insulin Syringe-Needle U-100 30G X 5/16" 1 ML MISC Use to inject insulin twice daily. 100 each 3    Lancets (ONETOUCH DELICA PLUS ERDEYC14G) MISC Use to test blood sugars up to 4 times daily as directed. 100 each 2   metFORMIN (GLUCOPHAGE) 500 MG tablet Take 2 tablets (1,000 mg total) by mouth 2 (two) times daily with a meal. 120 tablet 6   Multiple Vitamin (MULTIVITAMIN) tablet Take 1 tablet by mouth daily.     potassium chloride SA (KLOR-CON M) 20 MEQ tablet Take 1 tablet (20 mEq total) by mouth daily. 30 tablet 6   spironolactone (ALDACTONE) 25 MG tablet Take 0.5 tablets (12.5 mg total) by mouth daily. 45 tablet 3   azithromycin (ZITHROMAX) 250 MG tablet Take 1 tablet by mouth daily until completed 4 each 0   Dulaglutide (TRULICITY) 1.5 YJ/8.5UD SOPN Inject 1.5 mg into the skin once a week. 2 mL 6   losartan (COZAAR) 25 MG tablet Take 0.5 tablets (12.5 mg total) by mouth at bedtime. 45 tablet 3   methylPREDNISolone (MEDROL DOSEPAK) 4 MG TBPK tablet Taper as directed 21 tablet 0   gabapentin (NEURONTIN) 300 MG capsule Take 1 capsule (300 mg total) by mouth at bedtime. (Patient not taking: Reported on 09/07/2022) 30 capsule 6   No facility-administered medications prior to visit.     ROS Review of Systems  Constitutional:  Negative for activity change and appetite change.  HENT:  Negative for sinus pressure and sore throat.   Respiratory:  Negative for chest tightness, shortness of breath and wheezing.   Cardiovascular:  Negative for chest pain and palpitations.  Gastrointestinal:  Negative for abdominal distention, abdominal pain and constipation.  Genitourinary: Negative.   Musculoskeletal: Negative.   Psychiatric/Behavioral:  Negative for behavioral problems and dysphoric mood.     Objective:  BP 112/69   Pulse 79   Ht _0  (1.753 m)   Wt 224 lb 9.6 oz (101.9 kg)   SpO2 98%   BMI 33.17 kg/m      09/07/2022    8:55 AM 06/07/2022    4:40 PM 06/07/2022   12:23 PM  BP/Weight  Systolic BP 149 702 637  Diastolic BP 69 60 86  Wt. (Lbs) 224.6    BMI 33.17 kg/m2         Physical Exam Constitutional:      Appearance: He is well-developed.  Cardiovascular:     Rate and Rhythm: Normal rate.     Heart sounds: Normal heart sounds. No murmur heard. Pulmonary:     Effort: Pulmonary effort is normal.     Breath sounds: Normal breath sounds. No wheezing or rales.  Chest:     Chest wall: No tenderness.  Abdominal:     General: Bowel sounds are normal. There is no distension.     Palpations: Abdomen is soft. There is no mass.     Tenderness: There is no abdominal tenderness.  Musculoskeletal:        General: Normal range of motion.     Right lower leg: No edema.     Left lower leg: No edema.  Neurological:     Mental Status: He is alert and oriented to person, place, and time.  Psychiatric:        Mood and Affect: Mood normal.        Latest Ref Rng & Units 06/07/2022    8:04 AM 06/06/2022   12:19 AM 06/05/2022    6:29 PM  CMP  Glucose 70 - 99 mg/dL 362  315  332   BUN 8 - 23 mg/dL _0 Creatinine 0.61 - 1.24 mg/dL 1.40  1.35  1.45   Sodium 135 - 145 mmol/L 135  134  134   Potassium 3.5 - 5.1 mmol/L 4.9  3.6  4.7   Chloride 98 - 111 mmol/L 100  104  105   CO2 22 - 32 mmol/L _1 Calcium 8.9 - 10.3 mg/dL 8.6  8.2  8.4     Lipid Panel     Component Value Date/Time   CHOL 88 11/24/2021 0219   CHOL 151 06/27/2020 1216   TRIG 99 11/24/2021 0219   HDL 23 (L) 11/24/2021 0219   HDL 27 (L) 06/27/2020 1216   CHOLHDL 3.8 11/24/2021 0219   VLDL 20 11/24/2021 0219   LDLCALC 45 11/24/2021 0219   LDLCALC 100 (H) 06/27/2020 1216    CBC    Component Value Date/Time   WBC 16.8 (H) 06/07/2022 0804   RBC 4.25 06/07/2022 0804   HGB 11.8 (L) 06/07/2022 0804   HCT 36.9 (L) 06/07/2022 0804   PLT 278 06/07/2022 0804   MCV 86.8 06/07/2022 0804   MCH 27.8 06/07/2022 0804   MCHC 32.0 06/07/2022 0804   RDW 16.7 (H) 06/07/2022 0804   LYMPHSABS 1.4 06/05/2022 1259   MONOABS 0.4 06/05/2022 1259   EOSABS 0.3 06/05/2022 1259   BASOSABS  0.1 06/05/2022 1259    Lab Results  Component Value Date   HGBA1C 9.0 (A) 09/07/2022    Assessment & Plan:  1. Uncontrolled type 2 diabetes mellitus with hyperglycemia (HCC) Uncontrolled with A1c of 9.0; goal is less than 7.0 Increase dose of Trulicity from 1.5 to 3.0 mg Continue with Lantus 42 units, metformin Counseled on Diabetic diet, my plate method, 492 minutes of moderate intensity exercise/week Blood sugar logs with fasting goals of 80-120 mg/dl, random of less than 180 and in the event of sugars less than 60 mg/dl or greater than 400 mg/dl encouraged to notify the clinic. Advised on the need for annual eye exams, annual foot exams, Pneumonia vaccine. - POCT glucose (  manual entry) - POCT glycosylated hemoglobin (Hb A1C) - Dulaglutide (TRULICITY) 3 TF/5.7DU SOPN; Inject 3 mg as directed once a week.  Dispense: 2 mL; Refill: 6 - CMP14+EGFR - LP+Non-HDL Cholesterol  2. Other diabetic neurological complication associated with type 2 diabetes mellitus ([place]) Uncontrolled Increased gabapentin dose and he has been advised to take this at night as he works in plumbing and cannot afford to be somnolent during the day - gabapentin (NEURONTIN) 300 MG capsule; Take 2 capsules (600 mg total) by mouth at bedtime.  Dispense: 60 capsule; Refill: 6  3. Screening for colon cancer - Fecal occult blood, imunochemical([facility])  4. Hypertension associated with diabetes ([place]) Controlled Continue current antihypertensive regimen Counseled on blood pressure goal of less than 130/80, low-sodium, DASH diet, medication compliance, 150 minutes of moderate intensity exercise per week. Discussed medication compliance, adverse effects. - losartan (COZAAR) 25 MG tablet; Take 0.5 tablets (12.5 mg total) by mouth at bedtime.  Dispense: 45 tablet; Refill: 3  5. Coronary artery disease involving coronary bypass graft of native heart without angina pectoris Asymptomatic Risk factor modification  including smoking cessation Follow-up with cardiology  6. PAD (peripheral artery disease) (HCC) Status post PCI Advised smoking cessation will be beneficial Risk factor modification Continue high intensity statin  7. Need for immunization against influenza - Flu Vaccine QUAD 37moIM (Fluarix, Fluzone & Alfiuria Quad PF)  Health Care Maintenance: Ordered testing for colon cancer screening. Meds ordered this encounter  Medications   gabapentin (NEURONTIN) 300 MG capsule    Sig: Take 2 capsules (600 mg total) by mouth at bedtime.    Dispense:  60 capsule    Refill:  6   Dulaglutide (TRULICITY) 3 MKG/2.5KYSOPN    Sig: Inject 3 mg as directed once a week.    Dispense:  2 mL    Refill:  6    Dose increase   losartan (COZAAR) 25 MG tablet    Sig: Take 0.5 tablets (12.5 mg total) by mouth at bedtime.    Dispense:  45 tablet    Refill:  3    Follow-up: Return in about 3 months (around 12/08/2022) for Chronic medical conditions.       ECharlott Rakes MD, FAAFP. [facility] [place]  09/07/2022, 11:23 AM

## 2022-09-07 NOTE — Progress Notes (Signed)
Sending in [place] 1mg /weekly to replace Trulicity. No MAP or DOH stock available for Trulicity at the 3mg  dose.

## 2022-09-08 LAB — CMP14+EGFR
ALT: 19 IU/L (ref 0–44)
AST: 17 IU/L (ref 0–40)
Albumin/Globulin Ratio: 1.3 (ref 1.2–2.2)
Albumin: 4.7 g/dL (ref 3.9–4.9)
Alkaline Phosphatase: 80 IU/L (ref 44–121)
BUN/Creatinine Ratio: 13 (ref 10–24)
BUN: 16 mg/dL (ref 8–27)
Bilirubin Total: 0.8 mg/dL (ref 0.0–1.2)
CO2: 22 mmol/L (ref 20–29)
Calcium: 10.1 mg/dL (ref 8.6–10.2)
Chloride: 98 mmol/L (ref 96–106)
Creatinine, Ser: 1.28 mg/dL — ABNORMAL HIGH (ref 0.76–1.27)
Globulin, Total: 3.5 g/dL (ref 1.5–4.5)
Glucose: 139 mg/dL — ABNORMAL HIGH (ref 70–99)
Potassium: 5.1 mmol/L (ref 3.5–5.2)
Sodium: 139 mmol/L (ref 134–144)
Total Protein: 8.2 g/dL (ref 6.0–8.5)
eGFR: 62 mL/min/{1.73_m2} (ref >59)

## 2022-09-08 LAB — LP+NON-HDL CHOLESTEROL
Cholesterol, Total: 122 mg/dL (ref 100–199)
HDL: 32 mg/dL — ABNORMAL LOW (ref >39)
LDL Chol Calc (NIH): 73 mg/dL (ref 0–99)
Total Non-HDL-Chol (LDL+VLDL): 90 mg/dL (ref 0–129)
Triglycerides: 87 mg/dL (ref 0–149)
VLDL Cholesterol Cal: 17 mg/dL (ref 5–40)

## 2022-09-14 ENCOUNTER — Other Ambulatory Visit: Payer: Self-pay

## 2022-09-30 ENCOUNTER — Other Ambulatory Visit: Payer: Self-pay | Admitting: Family Medicine

## 2022-09-30 ENCOUNTER — Other Ambulatory Visit (HOSPITAL_COMMUNITY): Payer: Self-pay | Admitting: Family Medicine

## 2022-09-30 ENCOUNTER — Other Ambulatory Visit: Payer: Self-pay

## 2022-09-30 MED ORDER — TECHLITE PEN NEEDLES 32G X 4 MM MISC
1.0000 | 2 refills | Status: DC | PRN
  Filled 2022-09-30: qty 100, 100d supply, fill #0
  Filled 2023-02-02: qty 100, 100d supply, fill #1
  Filled 2023-05-11: qty 100, 100d supply, fill #2
  Filled 2023-08-05: qty 100, 100d supply, fill #3

## 2022-09-30 MED ORDER — SPIRONOLACTONE 25 MG PO TABS
12.5000 mg | ORAL_TABLET | Freq: Every day | ORAL | 1 refills | Status: DC
  Filled 2022-09-30: qty 45, 90d supply, fill #0
  Filled 2023-02-02: qty 45, 90d supply, fill #1

## 2022-09-30 NOTE — Telephone Encounter (Signed)
Requested medication (s) are due for refill today: yes  Requested medication (s) are on the active medication list: yes  Last refill:  08/31/21 #200/0  Future visit scheduled: yes  Notes to clinic:  Unable to refill per protocol, last refill by another provider.    Requested Prescriptions  Pending Prescriptions Disp Refills   Insulin Pen Needle (TECHLITE PEN NEEDLES) 32G X 4 MM MISC 200 each 0    Sig: Use as directed with insulin pen     Endocrinology: Diabetes - Testing Supplies Passed - 09/30/2022 12:49 PM      Passed - Valid encounter within last 12 months    Recent Outpatient Visits           3 weeks ago Uncontrolled type 2 diabetes mellitus with hyperglycemia (HCC)   [facility] [place], [place], MD   2 months ago Uncontrolled type 2 diabetes mellitus with hyperglycemia [facility])   [facility] [place], Jeannett Senior L, RPH-CPP   4 months ago Uncontrolled type 2 diabetes mellitus with hyperglycemia (HCC)   [facility] [place], Odette Horns, MD   12 months ago Uncontrolled type 2 diabetes mellitus with hyperglycemia [facility])   [facility] [place], Odette Horns, MD   1 year ago Uncontrolled type 2 diabetes mellitus with hyperglycemia [facility])   [facility] Hoy Register, MD       Future Appointments             In 2 weeks Lois Huxley, Cornelius Moras, RPH-CPP  [facility]   In 2 months Hoy Register, MD [facility]

## 2022-09-30 NOTE — Telephone Encounter (Signed)
Requested medication (s) are due for refill today: yes  Requested medication (s) are on the active medication list: yes  Last refill:  09/08/21 #45/3  Future visit scheduled: yes  Notes to clinic:  Unable to refill per protocol, last refill by another provider.      Requested Prescriptions  Pending Prescriptions Disp Refills   spironolactone (ALDACTONE) 25 MG tablet 45 tablet 3    Sig: Take 0.5 tablets (12.5 mg total) by mouth daily.     There is no refill protocol information for this order

## 2022-10-01 ENCOUNTER — Other Ambulatory Visit: Payer: Self-pay

## 2022-10-15 ENCOUNTER — Ambulatory Visit: Payer: Self-pay | Admitting: Pharmacist

## 2022-10-21 ENCOUNTER — Other Ambulatory Visit: Payer: Self-pay

## 2022-10-22 ENCOUNTER — Other Ambulatory Visit: Payer: Self-pay

## 2022-11-19 ENCOUNTER — Other Ambulatory Visit: Payer: Self-pay | Admitting: Family Medicine

## 2022-11-19 ENCOUNTER — Other Ambulatory Visit: Payer: Self-pay

## 2022-11-19 MED ORDER — BASAGLAR KWIKPEN 100 UNIT/ML ~~LOC~~ SOPN
42.0000 [IU] | PEN_INJECTOR | Freq: Every day | SUBCUTANEOUS | 0 refills | Status: DC
  Filled 2022-11-19: qty 15, 35d supply, fill #0

## 2022-11-22 ENCOUNTER — Other Ambulatory Visit: Payer: Self-pay

## 2022-12-09 ENCOUNTER — Ambulatory Visit: Payer: Self-pay | Attending: Family Medicine | Admitting: Family Medicine

## 2022-12-09 ENCOUNTER — Encounter: Payer: Self-pay | Admitting: Family Medicine

## 2022-12-09 ENCOUNTER — Other Ambulatory Visit: Payer: Self-pay

## 2022-12-09 VITALS — BP 136/82 | HR 74 | Temp 98.0°F | Ht 69.0 in | Wt 227.8 lb

## 2022-12-09 DIAGNOSIS — E1149 Type 2 diabetes mellitus with other diabetic neurological complication: Secondary | ICD-10-CM

## 2022-12-09 DIAGNOSIS — Z1211 Encounter for screening for malignant neoplasm of colon: Secondary | ICD-10-CM

## 2022-12-09 DIAGNOSIS — I739 Peripheral vascular disease, unspecified: Secondary | ICD-10-CM

## 2022-12-09 DIAGNOSIS — I25810 Atherosclerosis of coronary artery bypass graft(s) without angina pectoris: Secondary | ICD-10-CM

## 2022-12-09 DIAGNOSIS — I152 Hypertension secondary to endocrine disorders: Secondary | ICD-10-CM

## 2022-12-09 DIAGNOSIS — E1165 Type 2 diabetes mellitus with hyperglycemia: Secondary | ICD-10-CM

## 2022-12-09 DIAGNOSIS — E1159 Type 2 diabetes mellitus with other circulatory complications: Secondary | ICD-10-CM

## 2022-12-09 LAB — POCT GLYCOSYLATED HEMOGLOBIN (HGB A1C): HbA1c, POC (controlled diabetic range): 9.3 % — AB (ref 0.0–7.0)

## 2022-12-09 LAB — GLUCOSE, POCT (MANUAL RESULT ENTRY): POC Glucose: 159 mg/dl — AB (ref 70–99)

## 2022-12-09 MED ORDER — TRULICITY 1.5 MG/0.5ML ~~LOC~~ SOAJ
1.5000 mg | SUBCUTANEOUS | 1 refills | Status: DC
  Filled 2022-12-09: qty 2, 28d supply, fill #0
  Filled 2022-12-31: qty 2, 28d supply, fill #1

## 2022-12-09 MED ORDER — BASAGLAR KWIKPEN 100 UNIT/ML ~~LOC~~ SOPN
42.0000 [IU] | PEN_INJECTOR | Freq: Every day | SUBCUTANEOUS | 3 refills | Status: DC
  Filled 2022-12-09: qty 30, 71d supply, fill #0
  Filled 2022-12-31: qty 15, 35d supply, fill #0
  Filled 2023-02-02: qty 15, 35d supply, fill #1
  Filled 2023-03-24: qty 15, 35d supply, fill #2
  Filled 2023-05-11 – 2023-05-12 (×2): qty 15, 35d supply, fill #3

## 2022-12-09 NOTE — Patient Instructions (Signed)
Neuropathic Pain Neuropathic pain is pain caused by damage to the nerves that are responsible for certain sensations in your body (sensory nerves). Neuropathic pain can make you more sensitive to pain. Even a minor sensation can feel very painful. This is usually a long-term (chronic) condition that can be difficult to treat. The type of pain differs from person to person. It may: Start suddenly (acute), or it may develop slowly and become chronic. Come and go as damaged nerves heal, or it may stay at the same level for years. Cause emotional distress, loss of sleep, and a lower quality of life. What are the causes? The most common cause of this condition is diabetes. Many other diseases and conditions can also cause neuropathic pain. Causes of neuropathic pain can be classified as: Toxic. This is caused by medicines and chemicals. The most common causes of toxic neuropathic pain is damage from medicines that kill cancer cells (chemotherapy) or alcohol abuse. Metabolic. This can be caused by: Diabetes. Lack of vitamins like B12. Traumatic. Any injury that cuts, crushes, or stretches a nerve can cause damage and pain. Compression-related. If a sensory nerve gets trapped or compressed for a long period of time, the blood supply to the nerve can be cut off. Vascular. Many blood vessel diseases can cause neuropathic pain by decreasing blood supply and oxygen to nerves. Autoimmune. This type of pain results from diseases in which the body's defense system (immune system) mistakenly attacks sensory nerves. Examples of autoimmune diseases that can cause neuropathic pain include lupus and multiple sclerosis. Infectious. Many types of viral infections can damage sensory nerves and cause pain. Shingles infection is a common cause of this type of pain. Inherited. Neuropathic pain can be a symptom of many diseases that are passed down through families (genetic). What increases the risk? You are more likely to  develop this condition if: You have diabetes. You smoke. You drink too much alcohol. You are taking certain medicines, including chemotherapy or medicines that treat immune system disorders. What are the signs or symptoms? The main symptom is pain. Neuropathic pain is often described as: Burning. Shock-like. Stinging. Hot or cold. Itching. How is this diagnosed? No single test can diagnose neuropathic pain. It is diagnosed based on: A physical exam and your symptoms. Your health care provider will ask you about your pain. You may be asked to use a pain scale to describe how bad your pain is. Tests. These may be done to see if you have a cause and location of any nerve damage. They include: Nerve conduction studies and electromyography to test how well nerve signals travel through your nerves and muscles (electrodiagnostic testing). Skin biopsy to evaluate for small fiber neuropathy. Imaging studies, such as: X-rays. CT scan. MRI. How is this treated? Treatment for neuropathic pain may change over time. You may need to try different treatment options or a combination of treatments. Some options include: Treating the underlying cause of the neuropathy, such as diabetes, kidney disease, or vitamin deficiencies. Stopping medicines that can cause neuropathy, such as chemotherapy. Medicine to relieve pain. Medicines may include: Prescription or over-the-counter pain medicine. Anti-seizure medicine. Antidepressant medicines. Pain-relieving patches or creams that are applied to painful areas of skin. A medicine to numb the area (local anesthetic), which can be injected as a nerve block. Transcutaneous nerve stimulation. This uses electrical currents to block painful nerve signals. The treatment is painless. Alternative treatments, such as: Acupuncture. Meditation. Massage. Occupational or physical therapy. Pain management programs. Counseling. Follow  these instructions at  home: Medicines  Take over-the-counter and prescription medicines only as told by your health care provider. Ask your health care provider if the medicine prescribed to you: Requires you to avoid driving or using machinery. Can cause constipation. You may need to take these actions to prevent or treat constipation: Drink enough fluid to keep your urine pale yellow. Take over-the-counter or prescription medicines. Eat foods that are high in fiber, such as beans, whole grains, and fresh fruits and vegetables. Limit foods that are high in fat and processed sugars, such as fried or sweet foods. Lifestyle  Have a good support system at home. Consider joining a chronic pain support group. Do not use any products that contain nicotine or tobacco. These products include cigarettes, chewing tobacco, and vaping devices, such as e-cigarettes. If you need help quitting, ask your health care provider. Do not drink alcohol. General instructions Learn as much as you can about your condition. Work closely with all your health care providers to find the treatment plan that works best for you. Ask your health care provider what activities are safe for you. Keep all follow-up visits. This is important. Contact a health care provider if: Your pain treatments are not working. You are having side effects from your medicines. You are struggling with tiredness (fatigue), mood changes, depression, or anxiety. Get help right away if: You have thoughts of hurting yourself. Get help right away if you feel like you may hurt yourself or others, or have thoughts about taking your own life. Go to your nearest emergency room or: Call 911. Call the National Suicide Prevention Lifeline at 1-800-273-8255 or 988. This is open 24 hours a day. Text the Crisis Text Line at 741741. Summary Neuropathic pain is pain caused by damage to the nerves that are responsible for certain sensations in your body (sensory  nerves). Neuropathic pain may come and go as damaged nerves heal, or it may stay at the same level for years. Neuropathic pain is usually a long-term condition that can be difficult to treat. Consider joining a chronic pain support group. This information is not intended to replace advice given to you by your health care provider. Make sure you discuss any questions you have with your health care provider. Document Revised: 06/15/2021 Document Reviewed: 06/15/2021 [employer] Patient Education  2023 [employer]  

## 2022-12-09 NOTE — Progress Notes (Signed)
Subjective:  Patient ID: Robert Sanchez, male    DOB: May 23, 1956  Age: 67 y.o. MRN: [id number]  CC: Diabetes   HPI Robert Sanchez is a 67 y.o. year old male with a history of  Type 2 DM (A1c 9.3) , CAD s/p CABG x3, PVD status post thrombectomy of left common femoral to PT composite bypass, with retrograde iliac angioplasty and revision of distal bypass to below the knee popliteal artery/TP trunk with a bovine pericardial patch angioplasty, subsequent thrombosis and angioplasty of distal anastomosis, partial L big toe amputation in 07/2020.  In 11/2021 he did undergo left femoral endarterectomy with profundoplasty bovine patch and a left common femoral to anterior tibial bypass with composite PTFE and great saphenous vein, on 3L oxygen (at night since COVID infection in 06/2022)   Interval History:  He quit taking Ozempic as it made him sick. While on Trulicity he had no problems but the 49m was unavailable via DOH/PASS hence he was switched to Ozempic by the pharmacy.  A1c is 9.3 today.  He endorses adherence with his Basaglar and metformin. Gabapentin dose was increased at last visit due to uncontrolled neuropathy and he feels better on the new dose.  He has no visual concerns and has had no hypoglycemia.  He does not use oxygen every night but uses it as needed. He has no chest pain, dyspnea, pedal edema, orthopnea. He does not have claudication pains. Tolerating his antihypertensive and his statin. Denies presence of additional concerns today. Past Medical History:  Diagnosis Date   Acute pulmonary edema ([facility] 06/05/2018   Arthritis    CHF (congestive heart failure) (HCC)    COPD (chronic obstructive pulmonary disease) ([facility]    " beginning stages " per pt   Coronary artery disease    Diabetes mellitus without complication ([facility]    type 2   Diabetic neuropathy (HCC)    Diabetic neuropathy ([facility]    Emphysema/COPD ([facility]    " beginning stages"   GERD (gastroesophageal reflux  disease)    HLD (hyperlipidemia)    HOH (hard of hearing)    no hearing aids   Myocardial infarction ([facility]    Peripheral vascular disease ([facility]     Past Surgical History:  Procedure Laterality Date   ABDOMINAL AORTOGRAM W/LOWER EXTREMITY N/A 06/21/2018   Procedure: ABDOMINAL AORTOGRAM W/LOWER EXTREMITY;  Surgeon: CMarty Heck MD;  [place] [place];  Service: Cardiovascular;  Laterality: N/A;   ABDOMINAL AORTOGRAM W/LOWER EXTREMITY Left 01/16/2020   Procedure: ABDOMINAL AORTOGRAM W/LOWER EXTREMITY;  Surgeon: CMarty Heck MD;  [place];  Service: Cardiovascular;  Laterality: Left;   ABDOMINAL AORTOGRAM W/LOWER EXTREMITY N/A 07/09/2020   Procedure: ABDOMINAL AORTOGRAM W/LOWER EXTREMITY;  Surgeon: CMarty Heck MD;  [place] [facility];  Service: Cardiovascular;  Laterality: N/A;  TPA infusion   ABDOMINAL AORTOGRAM W/LOWER EXTREMITY Bilateral 11/20/2021   Procedure: ABDOMINAL AORTOGRAM W/LOWER EXTREMITY;  Surgeon: HCherre Robins MD;  [place] [facility];  Service: Cardiovascular;  Laterality: Bilateral;   AMPUTATION Left 08/04/2020   Procedure: LEFT PARTIAL GREAT TOE AMPUTATION;  Surgeon: CMarty Heck MD;  [place]  Service: Vascular;  Laterality: Left;   ANGIOPLASTY Left 11/23/2021   Procedure: ANGIOPLASTY with BOVINE PATCH 1cmx6cm;  Surgeon: CMarty Heck MD;  [place]  Service: Vascular;  Laterality: Left;   APPLICATION OF WOUND VAC Left 08/04/2020   Procedure: LEFT GROIN DEBRIDEMENT WITH APPLICATION OF WOUND  VAC;  Surgeon: [name], MD;  Location[facility];  Service: Vascular;  Laterality: Left;   CORONARY ARTERY BYPASS GRAFT N/A 06/12/2018   Procedure: CORONARY ARTERY BYPASS GRAFTING (CABG) x 3 WITH ENDOSCOPIC HARVESTING OF RIGHT GREATER SAPHENOUS VEIN. LIMA TO LAD. SVG TO PD. SVG TO DIAGONAL.;  Surgeon: Ivin Poot, MD;  Location: [place];  Service: Open Heart Surgery;   Laterality: N/A;   ENDARTERECTOMY FEMORAL Left 11/23/2021   Procedure: ENDARTERECTOMY FEMORAL;  Surgeon: [name], MD;  Location: [place];  Service: Vascular;  Laterality: Left;   FEMORAL-POPLITEAL BYPASS GRAFT Left 07/10/2020   Procedure: REDO EXPOSURE OF LEFT COMMON FEMORAL ARTERY LEFT COMMON FEMORAL TO POSTERIOR TIBIAL COMPOSITE BYPASS GRAFT;  Surgeon: [name], MD;  Location: [place];  Service: Vascular;  Laterality: Left;   FEMORAL-TIBIAL BYPASS GRAFT Left 08/21/2018   Procedure: BYPASS GRAFT LEFT FEMORAL TO POSTERIOR TIBIAL ARTERY USING LEFT REVERSED GREAT SAPHENOUS VEIN;  Surgeon: [name], MD;  Location: [place];  Service: Vascular;  Laterality: Left;   FEMORAL-TIBIAL BYPASS GRAFT Left 08/04/2020   Procedure: REVISION FEMORAL-DISTAL BYPASS WITH BIOLOGICAL BOVINE PATCH;  Surgeon: [name], MD;  Location: [place];  Service: Vascular;  Laterality: Left;   FEMORAL-TIBIAL BYPASS GRAFT Left 11/23/2021   Procedure: REDO LEFT FEMORAL-TIBIAL ARTERY BYPASS GRAFT USING PROPATEN 57m VASCULAR GRAFT REMOVABLE R[place]  Surgeon: CMarty Heck MD;  [place]  Service: Vascular;  Laterality: Left;  Insert arterial line   KNEE ARTHROSCOPY     LOWER EXTREMITY ANGIOGRAM Left 08/04/2020   Procedure: LEFT ILIAC ANGIOGRAM, ANGIOPLASTY OF LEFT ILIAC STENT WITH DRUG COATED BALLOON;  Surgeon: CMarty Heck MD;  [place]  Service: Vascular;  Laterality: Left;   LOWER EXTREMITY ANGIOGRAPHY Left 01/17/2020   Procedure: Lower Extremity Angiography;  Surgeon: CMarty Heck MD;  [place] [facility];  Service: Cardiovascular;  Laterality: Left;   LOWER EXTREMITY ANGIOGRAPHY N/A 01/07/2021   Procedure: LOWER EXTREMITY ANGIOGRAPHY;  Surgeon: HCherre Robins MD;  [place] [facility];  Service: Cardiovascular;  Laterality: N/A;   PERIPHERAL VASCULAR BALLOON ANGIOPLASTY Left 01/17/2020   Procedure: PERIPHERAL VASCULAR BALLOON ANGIOPLASTY;   Surgeon: CMarty Heck MD;  [place];  Service: Cardiovascular;  Laterality: Left;  pt   PERIPHERAL VASCULAR INTERVENTION Left 06/21/2018   Procedure: PERIPHERAL VASCULAR INTERVENTION;  Surgeon: CMarty Heck MD;  [place] [facility];  Service: Cardiovascular;  Laterality: Left;   PERIPHERAL VASCULAR INTERVENTION Left 01/08/2021   Procedure: PERIPHERAL VASCULAR INTERVENTION;  Surgeon: CMarty Heck MD;  [place] [place];  Service: Cardiovascular;  Laterality: Left;   PERIPHERAL VASCULAR THROMBECTOMY Left 01/16/2020   Procedure: PERIPHERAL VASCULAR THROMBECTOMY;  Surgeon: CMarty Heck MD;  [place] [facility];  Service: Cardiovascular;  Laterality: Left;  Lytic Catheter Placement left fem-pop bypass   PERIPHERAL VASCULAR THROMBECTOMY Left 01/17/2020   Procedure: PERIPHERAL VASCULAR THROMBECTOMY;  Surgeon: CMarty Heck MD;  [place];  Service: Cardiovascular;  Laterality: Left;  fem-pt bypass   PERIPHERAL VASCULAR THROMBECTOMY Left 07/10/2020   Procedure: lysis recheck;  Surgeon: CMarty Heck MD;  [place] [facility];  Service: Cardiovascular;  Laterality: Left;   PULMONARY THROMBECTOMY N/A 01/08/2021   Procedure: LYSIS RECHECK;  Surgeon: CMarty Heck MD;  [place] [facility];  Service: Cardiovascular;  Laterality: N/A;   RIGHT/LEFT HEART CATH AND CORONARY ANGIOGRAPHY N/A  06/05/2018   Procedure: RIGHT/LEFT HEART CATH AND CORONARY ANGIOGRAPHY;  Surgeon: Dixie Dials, MD;  Location: [place] [facility];  Service: Cardiovascular;  Laterality: N/A;   SPINE SURGERY     TEE WITHOUT CARDIOVERSION N/A 06/12/2018   Procedure: TRANSESOPHAGEAL ECHOCARDIOGRAM (TEE);  Surgeon: Prescott Gum, Collier Salina, MD;  Location: [place];  Service: Open Heart Surgery;  Laterality: N/A;   teeth extractions     THROMBECTOMY FEMORAL ARTERY Left 08/04/2020   Procedure: THROMBECTOMY OF LEFT FEMORAL TP  COMPOSTITE BYPASS;  Surgeon: [name], MD;  Location: [place];  Service: Vascular;  Laterality: Left;   THROMBECTOMY ILIAC ARTERY Left 07/10/2020   Procedure: THROMBECTOMY OF LEFT EXTERNAL ILIAC AND LEFT COMMON FEMORAL AND LEFT POSTERIOR TIBIAL ARTERIES;  Surgeon: [name], MD;  Location: [place];  Service: Vascular;  Laterality: Left;   ULTRASOUND GUIDANCE FOR VASCULAR ACCESS Bilateral 11/23/2021   Procedure: ULTRASOUND GUIDANCE FOR VASCULAR ACCESS;  Surgeon: [name], MD;  Location: [place];  Service: Vascular;  Laterality: Bilateral;   VEIN HARVEST Left 08/21/2018   Procedure: VEIN HARVEST LEFT GREAT SAPHENOUS VEIN;  Surgeon: [name], MD;  Location: [place];  Service: Vascular;  Laterality: Left;   VEIN HARVEST Right 11/23/2021   Procedure: VEIN HARVEST;  Surgeon: [name], MD;  Location: [place];  Service: Vascular;  Laterality: Right;    Family History  Problem Relation Age of Onset   Diabetes Mother    Lung disease Mother    Cancer Father    Heart disease Father     Social History   Socioeconomic History   Marital status: Single    Spouse name: Not on file   Number of children: 1   Years of education: Not on file   Highest education level: 10th grade  Occupational History   Not on file  Tobacco Use   Smoking status: Some Days    Packs/day: 1.00    Years: 50.00    Total pack years: 50.00    Types: Cigarettes   Smokeless tobacco: Never   Tobacco comments:    1PPD x 50 years. 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   Vaping Use   Vaping Use: Never used  Substance and Sexual Activity   Alcohol use: Yes    Comment: occasional   Drug use: Yes    Types: Marijuana    Comment: Smokes Marijuana once a week   Sexual activity: Not on file  Other Topics Concern   Not on file  Social History Narrative   Not on file   Social Determinants of Health   Financial Resource Strain: Low Risk  (08/31/2021)   Overall Financial  Resource Strain (CARDIA)    Difficulty of Paying Living Expenses: Not very hard  Food Insecurity: No Food Insecurity (08/31/2021)   Hunger Vital Sign    Worried About Running Out of Food in the Last Year: Never true    Ran Out of Food in the Last Year: Never true  Transportation Needs: No Transportation Needs (08/31/2021)   PRAPARE - Hydrologist (Medical): No    Lack of Transportation (Non-Medical): No  Physical Activity: Not on file  Stress: Not on file  Social Connections: Not on file    Allergies  Allergen Reactions   Lisinopril Other (See Comments)    Syncope   Codeine Rash    Outpatient Medications Prior to Visit  Medication Sig Dispense Refill   albuterol (VENTOLIN HFA) 108 (  90 Base) MCG/ACT inhaler Inhale 2 puffs into the lungs every 6 hours as needed for wheezing or shortness of breath. 6.7 g 1   aspirin 81 MG EC tablet Take 1 tablet (81 mg total) by mouth daily. Swallow whole.     atorvastatin (LIPITOR) 80 MG tablet Take 1 tablet (80 mg total) by mouth at bedtime. 30 tablet 6   Blood Glucose Monitoring Suppl (ONETOUCH VERIO) w/Device KIT Use to test blood sugars up to 4 times daily as directed. 1 kit 0   carvedilol (COREG) 6.25 MG tablet Take 1 tablet (6.25 mg total) by mouth 2 (two) times daily with a meal. 60 tablet 5   furosemide (LASIX) 40 MG tablet Take 1 tablet (40 mg total) by mouth daily. 30 tablet 6   gabapentin (NEURONTIN) 300 MG capsule Take 2 capsules (600 mg total) by mouth at bedtime. 60 capsule 6   glucose blood (ONETOUCH VERIO) test strip Use to test blood sugars up to 4 times daily as directed. 100 each 2   Insulin Syringe-Needle U-100 30G X 5/16" 1 ML MISC Use to inject insulin twice daily. 100 each 3   Lancets (ONETOUCH DELICA PLUS 123XX123) MISC Use to test blood sugars up to 4 times daily as directed. 100 each 2   losartan (COZAAR) 25 MG tablet Take 0.5 tablets (12.5 mg total) by mouth at bedtime. 45 tablet 3   metFORMIN  (GLUCOPHAGE) 500 MG tablet Take 2 tablets (1,000 mg total) by mouth 2 (two) times daily with a meal. 120 tablet 6   Multiple Vitamin (MULTIVITAMIN) tablet Take 1 tablet by mouth daily.     Semaglutide, 1 MG/DOSE, 4 MG/3ML SOPN Inject 1 mg as directed once a week. 3 mL 0   spironolactone (ALDACTONE) 25 MG tablet Take 0.5 tablets (12.5 mg total) by mouth daily. 45 tablet 1   Insulin Glargine (BASAGLAR KWIKPEN) 100 UNIT/ML Inject 42 Units into the skin daily. 15 mL 0   potassium chloride SA (KLOR-CON M) 20 MEQ tablet Take 1 tablet (20 mEq total) by mouth daily. 30 tablet 6   Insulin Pen Needle (TECHLITE PEN NEEDLES) 32G X 4 MM MISC Use as directed with insulin pen (Patient not taking: Reported on 12/09/2022) 200 each 2   Dulaglutide (TRULICITY) 3 0000000 SOPN Inject 3 mg as directed once a week. (Patient not taking: Reported on 12/09/2022) 2 mL 6   No facility-administered medications prior to visit.     ROS Review of Systems  Constitutional:  Negative for activity change and appetite change.  HENT:  Negative for sinus pressure and sore throat.   Respiratory:  Negative for chest tightness, shortness of breath and wheezing.   Cardiovascular:  Negative for chest pain and palpitations.  Gastrointestinal:  Negative for abdominal distention, abdominal pain and constipation.  Genitourinary: Negative.   Musculoskeletal: Negative.   Psychiatric/Behavioral:  Negative for behavioral problems and dysphoric mood.     Objective:  BP 136/82   Pulse 74   Temp 98 F (36.7 C) (Oral)   Ht 5' 9"$  (1.753 m)   Wt 227 lb 12.8 oz (103.3 kg)   SpO2 98%   BMI 33.64 kg/m      12/09/2022   11:20 AM 12/09/2022   10:04 AM 09/07/2022    8:55 AM  BP/Weight  Systolic BP [id number]  Diastolic BP 82 78 69  Wt. (Lbs)  227.8 224.6  BMI  33.64 kg/m2 33.17 kg/m2      Physical Exam Constitutional:  Appearance: He is well-developed.  Cardiovascular:     Rate and Rhythm: Normal rate.     Heart sounds: Normal  heart sounds. No murmur heard. Pulmonary:     Effort: Pulmonary effort is normal.     Breath sounds: Normal breath sounds. No wheezing or rales.  Chest:     Chest wall: No tenderness.  Abdominal:     General: Bowel sounds are normal. There is no distension.     Palpations: Abdomen is soft. There is no mass.     Tenderness: There is no abdominal tenderness.  Musculoskeletal:        General: Normal range of motion.     Right lower leg: No edema.     Left lower leg: No edema.  Neurological:     Mental Status: He is alert and oriented to person, place, and time.  Psychiatric:        Mood and Affect: Mood normal.    Diabetic Foot Exam - Simple   Simple Foot Form Diabetic Foot exam was performed with the following findings: Yes 12/09/2022 10:42 AM  Visual Inspection See comments: Yes Sensation Testing See comments: Yes Pulse Check See comments: Yes Comments Left foot great toe amputation, erythema of left foot.  No ulcerations or skin breakdown in both feet. Decrease sensation in left foot, normal in right Nonpalpable pulses in both feet In office ABI revealed PAD bilaterally        Latest Ref Rng & Units 12/09/2022   11:25 AM 09/07/2022    9:27 AM 06/07/2022    8:04 AM  CMP  Glucose 70 - 99 mg/dL 130  139  362   BUN 8 - 27 mg/dL 28  16  27   $ Creatinine 0.76 - 1.27 mg/dL 1.06  1.28  1.40   Sodium 134 - 144 mmol/L 138  139  135   Potassium 3.5 - 5.2 mmol/L 5.3  5.1  4.9   Chloride 96 - 106 mmol/L 102  98  100   CO2 20 - 29 mmol/L 19  22  22   $ Calcium 8.6 - 10.2 mg/dL 9.6  10.1  8.6   Total Protein 6.0 - 8.5 g/dL 7.6  8.2    Total Bilirubin 0.0 - 1.2 mg/dL 0.6  0.8    Alkaline Phos 44 - 121 IU/L 75  80    AST 0 - 40 IU/L 15  17    ALT 0 - 44 IU/L 21  19      Lipid Panel     Component Value Date/Time   CHOL 122 09/07/2022 0927   TRIG 87 09/07/2022 0927   HDL 32 (L) 09/07/2022 0927   CHOLHDL 3.8 11/24/2021 0219   VLDL 20 11/24/2021 0219   LDLCALC 73 09/07/2022 0927     CBC    Component Value Date/Time   WBC 16.8 (H) 06/07/2022 0804   RBC 4.25 06/07/2022 0804   HGB 11.8 (L) 06/07/2022 0804   HCT 36.9 (L) 06/07/2022 0804   PLT 278 06/07/2022 0804   MCV 86.8 06/07/2022 0804   MCH 27.8 06/07/2022 0804   MCHC 32.0 06/07/2022 0804   RDW 16.7 (H) 06/07/2022 0804   LYMPHSABS 1.4 06/05/2022 1259   MONOABS 0.4 06/05/2022 1259   EOSABS 0.3 06/05/2022 1259   BASOSABS 0.1 06/05/2022 1259    Lab Results  Component Value Date   HGBA1C 9.3 (A) 12/09/2022    Assessment & Plan:  1. Uncontrolled type 2 diabetes mellitus with hyperglycemia ([place])  Uncontrolled with A1c of 9.3 He is unable to tolerate Ozempic Will switch him back to Trulicity 1.5 mg which is available via patient assistance program but the higher dose of 3 mg is unavailable Continue Basaglar and metformin Counseled on Diabetic diet, my plate method, [id number] minutes of moderate intensity exercise/week Blood sugar logs with fasting goals of 80-120 mg/dl, random of less than 180 and in the event of sugars less than 60 mg/dl or greater than 400 mg/dl encouraged to notify the clinic. Advised on the need for annual eye exams, annual foot exams, Pneumonia vaccine. - POCT glucose (manual entry) - POCT glycosylated hemoglobin (Hb A1C) - Insulin Glargine (BASAGLAR KWIKPEN) 100 UNIT/ML; Inject 42 Units into the skin daily.  Dispense: 30 mL; Refill: 3 - CMP14+EGFR - Dulaglutide (TRULICITY) 1.5 0000000 SOPN; Inject 1.5 mg into the skin once a week.  Dispense: 2 mL; Refill: 1  2. Screening for colon cancer - Fecal occult blood, imunochemical([facility])  3. Hypertension associated with diabetes ([place]) Controlled Continue current antihypertensives Counseled on blood pressure goal of less than 130/80, low-sodium, DASH diet, medication compliance, 150 minutes of moderate intensity exercise per week. Discussed medication compliance, adverse effects.  4. PAD (peripheral artery disease) ([place]) Status  PCI, with post multiple interventions Asymptomatic Advised to schedule earlier appointment with his vascular surgeon - POCT ABI Screening for Pilot No Charge  5. Other diabetic neurological complication associated with type 2 diabetes mellitus (HCC) Neuropathy is controlled on increased dose of gabapentin  6. Coronary artery disease involving coronary bypass graft of native heart without angina pectoris Status post CABG x 3 Asymptomatic Continue high intensity statin Risk factor modification Follow-up with cardiology    Meds ordered this encounter  Medications   Insulin Glargine (BASAGLAR KWIKPEN) 100 UNIT/ML    Sig: Inject 42 Units into the skin daily.    Dispense:  30 mL    Refill:  3   Dulaglutide (TRULICITY) 1.5 0000000 SOPN    Sig: Inject 1.5 mg into the skin once a week.    Dispense:  2 mL    Refill:  1    Discontinue Ozempic    Follow-up: Return in about 1 month (around 01/07/2023) for Blood sugar evaluation with Lurena Joiner, cancel tomorrow's appointment with Lurena Joiner.  PCP 3 months diabetes.Charlott Rakes, MD, FAAFP. [facility] [place], [place]   12/10/2022, 2:06 PM

## 2022-12-10 ENCOUNTER — Other Ambulatory Visit: Payer: Self-pay

## 2022-12-10 ENCOUNTER — Ambulatory Visit: Payer: Self-pay | Admitting: Pharmacist

## 2022-12-10 ENCOUNTER — Other Ambulatory Visit: Payer: Self-pay | Admitting: Family Medicine

## 2022-12-10 DIAGNOSIS — I739 Peripheral vascular disease, unspecified: Secondary | ICD-10-CM

## 2022-12-10 LAB — CMP14+EGFR
ALT: 21 IU/L (ref 0–44)
AST: 15 IU/L (ref 0–40)
Albumin/Globulin Ratio: 1.4 (ref 1.2–2.2)
Albumin: 4.4 g/dL (ref 3.9–4.9)
Alkaline Phosphatase: 75 IU/L (ref 44–121)
BUN/Creatinine Ratio: 26 — ABNORMAL HIGH (ref 10–24)
BUN: 28 mg/dL — ABNORMAL HIGH (ref 8–27)
Bilirubin Total: 0.6 mg/dL (ref 0.0–1.2)
CO2: 19 mmol/L — ABNORMAL LOW (ref 20–29)
Calcium: 9.6 mg/dL (ref 8.6–10.2)
Chloride: 102 mmol/L (ref 96–106)
Creatinine, Ser: 1.06 mg/dL (ref 0.76–1.27)
Globulin, Total: 3.2 g/dL (ref 1.5–4.5)
Glucose: 130 mg/dL — ABNORMAL HIGH (ref 70–99)
Potassium: 5.3 mmol/L — ABNORMAL HIGH (ref 3.5–5.2)
Sodium: 138 mmol/L (ref 134–144)
Total Protein: 7.6 g/dL (ref 6.0–8.5)
eGFR: 78 mL/min/{1.73_m2} (ref >59)

## 2022-12-10 MED ORDER — POTASSIUM CHLORIDE ER 10 MEQ PO TBCR
10.0000 meq | EXTENDED_RELEASE_TABLET | Freq: Every day | ORAL | 1 refills | Status: DC
  Filled 2022-12-10: qty 90, 90d supply, fill #0
  Filled 2023-03-24: qty 90, 90d supply, fill #1

## 2022-12-31 ENCOUNTER — Other Ambulatory Visit: Payer: Self-pay

## 2023-01-10 ENCOUNTER — Ambulatory Visit: Payer: Self-pay | Attending: Family Medicine | Admitting: Pharmacist

## 2023-01-10 ENCOUNTER — Other Ambulatory Visit: Payer: Self-pay

## 2023-01-10 ENCOUNTER — Encounter: Payer: Self-pay | Admitting: Pharmacist

## 2023-01-10 DIAGNOSIS — E1165 Type 2 diabetes mellitus with hyperglycemia: Secondary | ICD-10-CM

## 2023-01-10 MED ORDER — INSULIN LISPRO (1 UNIT DIAL) 100 UNIT/ML (KWIKPEN)
5.0000 [IU] | PEN_INJECTOR | Freq: Every day | SUBCUTANEOUS | 2 refills | Status: DC
  Filled 2023-01-10: qty 3, 28d supply, fill #0
  Filled 2023-03-24: qty 3, 28d supply, fill #1
  Filled 2023-05-11: qty 3, 28d supply, fill #2

## 2023-01-10 NOTE — Progress Notes (Signed)
    S:    PCP: Dr. Margarita Rana  67 y.o. male who presents for diabetes evaluation, education, and management.  PMH is significant for CAD w/ CABG x3, PAD, CHF, h/o MI, HTN, T2DM, COPD.  Patient was referred and last seen by Primary Care Provider, Dr. Margarita Rana, on 12/09/2022 where he reported that Ozempic made him sick so he was switched back to Trulicity 1.5 mg weekly.   Today, patient arrives in good spirits and presents without any assistance. He was able to restart Trulicity without issue. Denies GI side effects. He reports concern about losing his leg. Works part time as a Development worker, community.   Current diabetes medications include: Trulicity 1.5 mg weekly, Basaglar 42 units every morning, metformin 1000 mg BID Current hypertension medications include: losartan 25 mg daily, carvedilol 6.25 mg BID Current hyperlipidemia medications include: atorvastatin 80 mg daily  Patient reports adherence to taking all medications as prescribed.   Insurance coverage: None, he is Medicare eligible in May 2024.  Patient denies hypoglycemic events.  Reported home fasting blood sugars: 150-160  Reported 2 hour post-meal/random blood sugars: 175-185, occasionally 210.  Patient denies nocturia (nighttime urination).  Patient reports neuropathy (nerve pain). Patient denies visual changes. Patient reports self foot exams.   Patient reported dietary habits: Eats 3 meals/day Breakfast: cereal, fruit Lunch: works so often eats fast food, trying to bring his lunch more Dinner: balognie sandwich  Drinks: no sugar tea, rarely drinks alcohol   Patient-reported exercise habits: limited due to mobility   O:   Lab Results  Component Value Date   HGBA1C 9.3 (A) 12/09/2022   There were no vitals filed for this visit.  Lipid Panel     Component Value Date/Time   CHOL 122 09/07/2022 0927   TRIG 87 09/07/2022 0927   HDL 32 (L) 09/07/2022 0927   CHOLHDL 3.8 11/24/2021 0219   VLDL 20 11/24/2021 0219   LDLCALC 73  09/07/2022 0927    Clinical Atherosclerotic Cardiovascular Disease (ASCVD): Yes  The ASCVD Risk score (Arnett DK, et al., 2019) failed to calculate for the following reasons:   The valid total cholesterol range is 130 to 320 mg/dL   A/P: Diabetes longstanding currently above goal based on A1c (9.3%). Patient is able to verbalize appropriate hypoglycemia management plan. Medication adherence appears appropriate. Unable to increase Trulicity as he receives through [employer]. Plan to add mealtime insulin to improve A1c and allow for the addition of SGLT2i.  -Continued basal insulin Basaglar 42 units QAM. -Started rapid insulin Humalog (insulin lispro) 5 units with largest meal, typically supper.  -Continued GLP-1 Trulicity (dulaglutide) 1.5 mg weekly. Patient denies GI side effects.   -Defer initiation of SGLT2-I until A1c closer to goal. Given extensive cardiac history, patient would greatly benefit from addition.  -Continued metformin 1000 mg BID.  -Patient educated on purpose, proper use, and potential adverse effects of Humalog.  -Extensively discussed pathophysiology of diabetes, recommended lifestyle interventions, dietary effects on blood sugar control.  -Counseled on s/sx of and management of hypoglycemia.  -Next A1c anticipated May 2024.   Written patient instructions provided. Patient verbalized understanding of treatment plan.  Total time in face to face counseling 30 minutes.    Follow-up:  Pharmacist 1 month. PCP clinic visit in May 2024.   Joseph Art, Pharm.D. PGY-2 Ambulatory Care Pharmacy Resident

## 2023-02-02 ENCOUNTER — Other Ambulatory Visit: Payer: Self-pay

## 2023-02-02 ENCOUNTER — Other Ambulatory Visit: Payer: Self-pay | Admitting: Pharmacist

## 2023-02-02 ENCOUNTER — Other Ambulatory Visit: Payer: Self-pay | Admitting: Family Medicine

## 2023-02-02 DIAGNOSIS — E1165 Type 2 diabetes mellitus with hyperglycemia: Secondary | ICD-10-CM

## 2023-02-02 MED ORDER — TRULICITY 3 MG/0.5ML ~~LOC~~ SOAJ
3.0000 mg | SUBCUTANEOUS | 1 refills | Status: DC
  Filled 2023-02-02: qty 6, 84d supply, fill #0

## 2023-02-02 MED ORDER — TRULICITY 1.5 MG/0.5ML ~~LOC~~ SOAJ
1.5000 mg | SUBCUTANEOUS | 1 refills | Status: DC
  Filled 2023-02-02: qty 2, 28d supply, fill #0
  Filled 2023-03-24: qty 2, 28d supply, fill #1
  Filled 2023-05-11: qty 2, 28d supply, fill #2
  Filled 2023-06-06: qty 2, 28d supply, fill #3

## 2023-02-14 ENCOUNTER — Ambulatory Visit: Payer: Self-pay | Attending: Pharmacist | Admitting: Pharmacist

## 2023-03-08 ENCOUNTER — Ambulatory Visit: Payer: Self-pay | Attending: Family Medicine | Admitting: Family Medicine

## 2023-03-24 ENCOUNTER — Other Ambulatory Visit: Payer: Self-pay

## 2023-03-25 ENCOUNTER — Other Ambulatory Visit: Payer: Self-pay

## 2023-05-11 ENCOUNTER — Other Ambulatory Visit: Payer: Self-pay

## 2023-05-11 ENCOUNTER — Other Ambulatory Visit: Payer: Self-pay | Admitting: Family Medicine

## 2023-05-11 ENCOUNTER — Other Ambulatory Visit (HOSPITAL_COMMUNITY): Payer: Self-pay | Admitting: Family Medicine

## 2023-05-11 DIAGNOSIS — E1165 Type 2 diabetes mellitus with hyperglycemia: Secondary | ICD-10-CM

## 2023-05-11 DIAGNOSIS — I25810 Atherosclerosis of coronary artery bypass graft(s) without angina pectoris: Secondary | ICD-10-CM

## 2023-05-11 MED ORDER — CARVEDILOL 6.25 MG PO TABS
6.2500 mg | ORAL_TABLET | Freq: Two times a day (BID) | ORAL | 0 refills | Status: DC
Start: 2023-05-11 — End: 2023-07-19
  Filled 2023-05-11: qty 180, 90d supply, fill #0

## 2023-05-11 MED ORDER — SPIRONOLACTONE 25 MG PO TABS
12.5000 mg | ORAL_TABLET | Freq: Every day | ORAL | 0 refills | Status: DC
  Filled 2023-05-11: qty 45, 90d supply, fill #0

## 2023-05-11 MED ORDER — METFORMIN HCL 500 MG PO TABS
1000.0000 mg | ORAL_TABLET | Freq: Two times a day (BID) | ORAL | 0 refills | Status: DC
Start: 2023-05-11 — End: 2023-07-19
  Filled 2023-05-11: qty 360, 90d supply, fill #0

## 2023-05-12 ENCOUNTER — Other Ambulatory Visit: Payer: Self-pay | Admitting: Pharmacist

## 2023-05-12 ENCOUNTER — Other Ambulatory Visit: Payer: Self-pay

## 2023-05-12 MED ORDER — LANTUS SOLOSTAR 100 UNIT/ML ~~LOC~~ SOPN
42.0000 [IU] | PEN_INJECTOR | Freq: Every day | SUBCUTANEOUS | 2 refills | Status: DC
  Filled 2023-05-12: qty 6, 14d supply, fill #0
  Filled 2023-06-06: qty 6, 14d supply, fill #1
  Filled 2023-06-28: qty 12, 28d supply, fill #2

## 2023-05-13 ENCOUNTER — Other Ambulatory Visit (HOSPITAL_COMMUNITY): Payer: Self-pay

## 2023-05-13 ENCOUNTER — Other Ambulatory Visit: Payer: Self-pay

## 2023-06-06 ENCOUNTER — Other Ambulatory Visit: Payer: Self-pay | Admitting: Family Medicine

## 2023-06-06 ENCOUNTER — Other Ambulatory Visit: Payer: Self-pay

## 2023-06-07 ENCOUNTER — Other Ambulatory Visit: Payer: Self-pay

## 2023-06-07 MED ORDER — INSULIN LISPRO (1 UNIT DIAL) 100 UNIT/ML (KWIKPEN)
5.0000 [IU] | PEN_INJECTOR | Freq: Every day | SUBCUTANEOUS | 0 refills | Status: DC
  Filled 2023-06-07: qty 3, 28d supply, fill #0

## 2023-06-07 NOTE — Telephone Encounter (Signed)
Requested Prescriptions  Pending Prescriptions Disp Refills   insulin lispro (HUMALOG KWIKPEN) 100 UNIT/ML KwikPen 3 mL 0    Sig: Inject 5 Units into the skin daily before supper.     Endocrinology:  Diabetes - Insulins Failed - 06/06/2023  2:48 PM      Failed - HBA1C is between 0 and 7.9 and within 180 days    HbA1c, POC (controlled diabetic range)  Date Value Ref Range Status  12/09/2022 9.3 (A) 0.0 - 7.0 % Final         Passed - Valid encounter within last 6 months    Recent Outpatient Visits           4 months ago Uncontrolled type 2 diabetes mellitus with hyperglycemia [facility])   [place] [facility] [place], [place]ancia L, RPH-CPP   6 months ago Uncontrolled type 2 diabetes mellitus with hyperglycemia [facility])   [place] [facility] [place], [place], MD   9 months ago Uncontrolled type 2 diabetes mellitus with hyperglycemia [facility])   [place] [facility] [place], Berry, MD   11 months ago Uncontrolled type 2 diabetes mellitus with hyperglycemia [facility])   [place] [facility] [place], [place] L, RPH-CPP   1 year ago Uncontrolled type 2 diabetes mellitus with hyperglycemia [facility])   [place] [facility] Hoy Register, MD       Future Appointments             In 1 month Hoy Register, MD [facility]

## 2023-06-28 ENCOUNTER — Other Ambulatory Visit: Payer: Self-pay | Admitting: Family Medicine

## 2023-06-28 ENCOUNTER — Other Ambulatory Visit: Payer: Self-pay

## 2023-06-28 DIAGNOSIS — E1169 Type 2 diabetes mellitus with other specified complication: Secondary | ICD-10-CM

## 2023-06-28 MED ORDER — INSULIN LISPRO (1 UNIT DIAL) 100 UNIT/ML (KWIKPEN)
5.0000 [IU] | PEN_INJECTOR | Freq: Every day | SUBCUTANEOUS | 0 refills | Status: DC
Start: 2023-06-28 — End: 2023-07-19
  Filled 2023-06-28: qty 3, 60d supply, fill #0

## 2023-06-29 ENCOUNTER — Other Ambulatory Visit: Payer: Self-pay

## 2023-07-06 ENCOUNTER — Other Ambulatory Visit: Payer: Self-pay

## 2023-07-19 ENCOUNTER — Encounter: Payer: Self-pay | Admitting: Family Medicine

## 2023-07-19 ENCOUNTER — Ambulatory Visit: Payer: Self-pay | Attending: Family Medicine | Admitting: Family Medicine

## 2023-07-19 ENCOUNTER — Other Ambulatory Visit: Payer: Self-pay

## 2023-07-19 VITALS — BP 136/77 | HR 94 | Ht 69.0 in | Wt 231.6 lb

## 2023-07-19 DIAGNOSIS — E785 Hyperlipidemia, unspecified: Secondary | ICD-10-CM

## 2023-07-19 DIAGNOSIS — R413 Other amnesia: Secondary | ICD-10-CM

## 2023-07-19 DIAGNOSIS — E1169 Type 2 diabetes mellitus with other specified complication: Secondary | ICD-10-CM

## 2023-07-19 DIAGNOSIS — I739 Peripheral vascular disease, unspecified: Secondary | ICD-10-CM

## 2023-07-19 DIAGNOSIS — J328 Other chronic sinusitis: Secondary | ICD-10-CM

## 2023-07-19 DIAGNOSIS — Z7984 Long term (current) use of oral hypoglycemic drugs: Secondary | ICD-10-CM

## 2023-07-19 DIAGNOSIS — Z794 Long term (current) use of insulin: Secondary | ICD-10-CM

## 2023-07-19 DIAGNOSIS — F17210 Nicotine dependence, cigarettes, uncomplicated: Secondary | ICD-10-CM

## 2023-07-19 DIAGNOSIS — Z23 Encounter for immunization: Secondary | ICD-10-CM

## 2023-07-19 DIAGNOSIS — Z7985 Long-term (current) use of injectable non-insulin antidiabetic drugs: Secondary | ICD-10-CM

## 2023-07-19 DIAGNOSIS — I25810 Atherosclerosis of coronary artery bypass graft(s) without angina pectoris: Secondary | ICD-10-CM

## 2023-07-19 DIAGNOSIS — Z1211 Encounter for screening for malignant neoplasm of colon: Secondary | ICD-10-CM

## 2023-07-19 LAB — GLUCOSE, POCT (MANUAL RESULT ENTRY): POC Glucose: 150 mg/dL — AB (ref 70–99)

## 2023-07-19 LAB — POCT GLYCOSYLATED HEMOGLOBIN (HGB A1C): HbA1c, POC (controlled diabetic range): 7.7 % — AB (ref 0.0–7.0)

## 2023-07-19 MED ORDER — UMECLIDINIUM BROMIDE 62.5 MCG/ACT IN AEPB
1.0000 | INHALATION_SPRAY | Freq: Every day | RESPIRATORY_TRACT | 6 refills | Status: DC
  Filled 2023-07-19: qty 30, 30d supply, fill #0

## 2023-07-19 MED ORDER — SPIRONOLACTONE 25 MG PO TABS
12.5000 mg | ORAL_TABLET | Freq: Every day | ORAL | 1 refills | Status: DC
  Filled 2023-07-19 – 2024-01-12 (×2): qty 45, 90d supply, fill #0

## 2023-07-19 MED ORDER — LANTUS SOLOSTAR 100 UNIT/ML ~~LOC~~ SOPN
42.0000 [IU] | PEN_INJECTOR | Freq: Every day | SUBCUTANEOUS | 6 refills | Status: DC
Start: 2023-07-19 — End: 2024-04-24
  Filled 2023-07-19: qty 12, 28d supply, fill #0
  Filled 2023-09-06: qty 12, 28d supply, fill #1
  Filled 2023-10-07: qty 12, 28d supply, fill #2
  Filled 2023-11-24: qty 12, 28d supply, fill #3
  Filled 2024-01-12: qty 12, 28d supply, fill #4
  Filled 2024-02-27: qty 12, 28d supply, fill #5
  Filled 2024-04-03: qty 12, 28d supply, fill #6

## 2023-07-19 MED ORDER — FUROSEMIDE 40 MG PO TABS
40.0000 mg | ORAL_TABLET | Freq: Every day | ORAL | 1 refills | Status: DC
  Filled 2023-07-19: qty 90, 90d supply, fill #0

## 2023-07-19 MED ORDER — ALBUTEROL SULFATE HFA 108 (90 BASE) MCG/ACT IN AERS
2.0000 | INHALATION_SPRAY | Freq: Four times a day (QID) | RESPIRATORY_TRACT | 1 refills | Status: DC | PRN
  Filled 2023-07-19: qty 6.7, 25d supply, fill #0
  Filled 2024-01-12: qty 6.7, 25d supply, fill #1

## 2023-07-19 MED ORDER — POTASSIUM CHLORIDE ER 10 MEQ PO TBCR
10.0000 meq | EXTENDED_RELEASE_TABLET | Freq: Every day | ORAL | 1 refills | Status: DC
  Filled 2023-07-19: qty 90, 90d supply, fill #0
  Filled 2024-01-12: qty 30, 30d supply, fill #1

## 2023-07-19 MED ORDER — METFORMIN HCL 500 MG PO TABS
1000.0000 mg | ORAL_TABLET | Freq: Two times a day (BID) | ORAL | 1 refills | Status: DC
  Filled 2023-07-19 – 2023-10-07 (×2): qty 360, 90d supply, fill #0
  Filled 2024-04-03: qty 360, 90d supply, fill #1

## 2023-07-19 MED ORDER — INSULIN LISPRO (1 UNIT DIAL) 100 UNIT/ML (KWIKPEN)
5.0000 [IU] | PEN_INJECTOR | Freq: Every day | SUBCUTANEOUS | 0 refills | Status: DC
  Filled 2023-07-19: qty 3, 28d supply, fill #0

## 2023-07-19 MED ORDER — CETIRIZINE HCL 10 MG PO TABS
10.0000 mg | ORAL_TABLET | Freq: Every day | ORAL | 1 refills | Status: DC
  Filled 2023-07-19: qty 30, 30d supply, fill #0
  Filled 2024-01-12: qty 30, 30d supply, fill #1

## 2023-07-19 MED ORDER — ATORVASTATIN CALCIUM 80 MG PO TABS
80.0000 mg | ORAL_TABLET | Freq: Every day | ORAL | 1 refills | Status: DC
  Filled 2023-07-19: qty 90, 90d supply, fill #0
  Filled 2024-01-12: qty 90, 90d supply, fill #1

## 2023-07-19 MED ORDER — TRULICITY 3 MG/0.5ML ~~LOC~~ SOAJ
3.0000 mg | SUBCUTANEOUS | 6 refills | Status: DC
Start: 2023-07-19 — End: 2024-04-24
  Filled 2023-07-19: qty 2, 28d supply, fill #0
  Filled 2023-09-06: qty 2, 28d supply, fill #1
  Filled 2023-11-24: qty 2, 28d supply, fill #2
  Filled 2024-01-12: qty 2, 28d supply, fill #3
  Filled 2024-02-27: qty 2, 28d supply, fill #4
  Filled 2024-04-03: qty 2, 28d supply, fill #5

## 2023-07-19 MED ORDER — OXYMETAZOLINE HCL 0.05 % NA SOLN
1.0000 | Freq: Two times a day (BID) | NASAL | 1 refills | Status: DC
Start: 2023-07-19 — End: 2024-06-29
  Filled 2023-07-19: qty 30, 150d supply, fill #0

## 2023-07-19 MED ORDER — CARVEDILOL 6.25 MG PO TABS
6.2500 mg | ORAL_TABLET | Freq: Two times a day (BID) | ORAL | 1 refills | Status: DC
Start: 2023-07-19 — End: 2024-04-24
  Filled 2023-07-19 – 2024-01-12 (×2): qty 180, 90d supply, fill #0

## 2023-07-19 NOTE — Patient Instructions (Signed)
Postnasal Drip Postnasal drip is the feeling of mucus going down the back of your throat. Mucus is a slimy substance that moistens and cleans your nose and throat, as well as the air pockets in face bones near your forehead and cheeks (sinuses). Small amounts of mucus pass from your nose and sinuses down the back of your throat all the time. This is normal. When you produce too much mucus or the mucus gets too thick, you can feel it. Some common causes of postnasal drip include: Having more mucus because of: A cold or the flu. Allergies. Cold air. Certain medicines. Gastroesophageal reflux. Having more mucus that is thicker because of: A sinus or nasal infection. Dry air. A food allergy. Follow these instructions at home: Relieving discomfort  Gargle with a mixture of salt and water 3-4 times a day or as needed. To make salt water, completely dissolve -1 tsp (3-6 g) of salt in 1 cup (237 mL) of warm water. If the air in your home is dry, use a humidifier to add moisture to the air. Use a saline spray or a container (neti pot) to flush out the nose (nasal irrigation). These methods can help clear away mucus and keep the nasal passages moist. General instructions Take over-the-counter and prescription medicines only as told by your health care provider. Follow instructions from your health care provider about eating or drinking restrictions. You may need to avoid caffeine. Avoid things that you know you are allergic to (allergens), like dust, mold, pollen, pets, or certain foods. Drink enough fluid to keep your urine pale yellow. Keep all follow-up visits. This is important. Contact a health care provider if: You have a fever. You have a sore throat or difficulty swallowing. You have a headache. You have sinus or ear pain. You have a cough that does not go away. The mucus from your nose becomes thick and is green or yellow in color. You have cold or flu symptoms that last more than 10  days. Summary Postnasal drip is the feeling of mucus going down the back of your throat. Use nasal irrigation or a nasal spray to help clear away mucus and keep the nasal passages moist. Avoid things that you know you are allergic to (allergens), like dust, mold, pollen, pets, or certain foods. This information is not intended to replace advice given to you by your health care provider. Make sure you discuss any questions you have with your health care provider. Document Revised: 09/17/2021 Document Reviewed: 09/17/2021 Elsevier Patient Education  2024 Elsevier Inc.  

## 2023-07-19 NOTE — Progress Notes (Signed)
Subjective:  Patient ID: Robert Sanchez, male    DOB: 1956-04-22  Age: 67 y.o. MRN: [id number]  CC: Medical Management of Chronic Issues (Pt concern with running nose and coughing up clear mucous for about a month. And having some memory issues.)   HPI Robert Sanchez is a 67 y.o. year old male with a history of Type 2 DM (A1c 7.7) , CAD s/p CABG x3, PVD status post thrombectomy of left common femoral to PT composite bypass, with retrograde iliac angioplasty and revision of distal bypass to below the knee popliteal artery/TP trunk with a bovine pericardial patch angioplasty, subsequent thrombosis and angioplasty of distal anastomosis, partial L big toe amputation in 07/2020.  In 11/2021 he did undergo left femoral endarterectomy with profundoplasty bovine patch and a left common femoral to anterior tibial bypass with composite PTFE and great saphenous vein, previous tobacco abuse (1 pack/day x 50 years), on 3L oxygen (at night since COVID infection in 06/2022),   Interval History: Discussed the use of AI scribe software for clinical note transcription with the patient, who gave verbal consent to proceed.  He presents with a cough and nasal drainage. He initially attributed these symptoms to a sinus infection. He has been using over-the-counter medication to manage these symptoms.  Rhinorrhea has resolved but he still has a cough which is productive of clear mucus.  The patient also reports memory issues, including forgetting to take his insulin and other medications. He has noticed an increase in forgetfulness, including forgetting the names of people he has known for years.  The patient also reports leg pain, which he attributes to peripheral artery disease. He has had multiple surgeries on his leg due to this condition. The patient has a history of smoking for 50 years and continues to smoke occasionally.   His A1c reveals an improvement with an A1c of 7.7 down from 9.3 previously.  He has been  on Trulicity as he was unable to tolerate Ozempic but has not noticed any weight loss but rather weight gain.  Also takes metformin, Basaglar, Humalog. From a cardiac standpoint he has no chest pain or dyspnea.  He does have intermittent wheezing and states he uses albuterol as needed.  He informs me he was told he had COPD during our pulmonary procedure last year.        Past Medical History:  Diagnosis Date   Acute pulmonary edema (HCC) 06/05/2018   Arthritis    CHF (congestive heart failure) (HCC)    COPD (chronic obstructive pulmonary disease) (HCC)    " beginning stages " per pt   Coronary artery disease    Diabetes mellitus without complication (HCC)    type 2   Diabetic neuropathy (HCC)    Diabetic neuropathy (HCC)    Emphysema/COPD (HCC)    " beginning stages"   GERD (gastroesophageal reflux disease)    HLD (hyperlipidemia)    HOH (hard of hearing)    no hearing aids   Myocardial infarction [facility])    Peripheral vascular disease (HCC)     Past Surgical History:  Procedure Laterality Date   ABDOMINAL AORTOGRAM W/LOWER EXTREMITY N/A 06/21/2018   Procedure: ABDOMINAL AORTOGRAM W/LOWER EXTREMITY;  Surgeon: Cephus Shelling, MD;  [facility]: [facility];  Service: Cardiovascular;  Laterality: N/A;   ABDOMINAL AORTOGRAM W/LOWER EXTREMITY Left 01/16/2020   Procedure: ABDOMINAL AORTOGRAM W/LOWER EXTREMITY;  Surgeon: Cephus Shelling, MD;  Location: [facility];  Service: Cardiovascular;  Laterality: Left;  ABDOMINAL AORTOGRAM W/LOWER EXTREMITY N/A 07/09/2020   Procedure: ABDOMINAL AORTOGRAM W/LOWER EXTREMITY;  Surgeon: Cephus Shelling, MD;  Location: [facility];  Service: Cardiovascular;  Laterality: N/A;  TPA infusion   ABDOMINAL AORTOGRAM W/LOWER EXTREMITY Bilateral 11/20/2021   Procedure: ABDOMINAL AORTOGRAM W/LOWER EXTREMITY;  Surgeon: Leonie Douglas, MD;  Location: [facility];  Service: Cardiovascular;  Laterality: Bilateral;   AMPUTATION  Left 08/04/2020   Procedure: LEFT PARTIAL GREAT TOE AMPUTATION;  Surgeon: Cephus Shelling, MD;  Location: [facility] OR;  Service: Vascular;  Laterality: Left;   ANGIOPLASTY Left 11/23/2021   Procedure: ANGIOPLASTY with BOVINE PATCH 1cmx6cm;  Surgeon: Cephus Shelling, MD;  Location: [facility] OR;  Service: Vascular;  Laterality: Left;   APPLICATION OF WOUND VAC Left 08/04/2020   Procedure: LEFT GROIN DEBRIDEMENT WITH APPLICATION OF WOUND VAC;  Surgeon: Cephus Shelling, MD;  Location: [facility] OR;  Service: Vascular;  Laterality: Left;   CORONARY ARTERY BYPASS GRAFT N/A 06/12/2018   Procedure: CORONARY ARTERY BYPASS GRAFTING (CABG) x 3 WITH ENDOSCOPIC HARVESTING OF RIGHT GREATER SAPHENOUS VEIN. LIMA TO LAD. SVG TO PD. SVG TO DIAGONAL.;  Surgeon: Kerin Perna, MD;  Location: [facility] OR;  Service: Open Heart Surgery;  Laterality: N/A;   ENDARTERECTOMY FEMORAL Left 11/23/2021   Procedure: ENDARTERECTOMY FEMORAL;  Surgeon: Cephus Shelling, MD;  Location: [facility] OR;  Service: Vascular;  Laterality: Left;   FEMORAL-POPLITEAL BYPASS GRAFT Left 07/10/2020   Procedure: REDO EXPOSURE OF LEFT COMMON FEMORAL ARTERY LEFT COMMON FEMORAL TO POSTERIOR TIBIAL COMPOSITE BYPASS GRAFT;  Surgeon: Cephus Shelling, MD;  [facility];  Service: Vascular;  Laterality: Left;   FEMORAL-TIBIAL BYPASS GRAFT Left 08/21/2018   Procedure: BYPASS GRAFT LEFT FEMORAL TO POSTERIOR TIBIAL ARTERY USING LEFT REVERSED GREAT SAPHENOUS VEIN;  Surgeon: Cephus Shelling, MD;  Location: [facility] OR;  Service: Vascular;  Laterality: Left;   FEMORAL-TIBIAL BYPASS GRAFT Left 08/04/2020   Procedure: REVISION FEMORAL-DISTAL BYPASS WITH BIOLOGICAL BOVINE PATCH;  Surgeon: Cephus Shelling, MD;  Location: [facility] OR;  Service: Vascular;  Laterality: Left;   FEMORAL-TIBIAL BYPASS GRAFT Left 11/23/2021   Procedure: REDO LEFT FEMORAL-TIBIAL ARTERY BYPASS GRAFT USING PROPATEN 6mm VASCULAR GRAFT REMOVABLE RING;  Surgeon: Cephus Shelling, MD;  [facility] OR;   Service: Vascular;  Laterality: Left;  Insert arterial line   KNEE ARTHROSCOPY     LOWER EXTREMITY ANGIOGRAM Left 08/04/2020   Procedure: LEFT ILIAC ANGIOGRAM, ANGIOPLASTY OF LEFT ILIAC STENT WITH DRUG COATED BALLOON;  Surgeon: Cephus Shelling, MD;  [facility];  Service: Vascular;  Laterality: Left;   LOWER EXTREMITY ANGIOGRAPHY Left 01/17/2020   Procedure: Lower Extremity Angiography;  Surgeon: Cephus Shelling, MD;  Location: [facility] INVASIVE CV LAB;  Service: Cardiovascular;  Laterality: Left;   LOWER EXTREMITY ANGIOGRAPHY N/A 01/07/2021   Procedure: LOWER EXTREMITY ANGIOGRAPHY;  Surgeon: Leonie Douglas, MD;  [facility];  Service: Cardiovascular;  Laterality: N/A;   PERIPHERAL VASCULAR BALLOON ANGIOPLASTY Left 01/17/2020   Procedure: PERIPHERAL VASCULAR BALLOON ANGIOPLASTY;  Surgeon: Cephus Shelling, MD;  [facility];  Service: Cardiovascular;  Laterality: Left;  pt   PERIPHERAL VASCULAR INTERVENTION Left 06/21/2018   Procedure: PERIPHERAL VASCULAR INTERVENTION;  Surgeon: Cephus Shelling, MD;  [facility];  Service: Cardiovascular;  Laterality: Left;   PERIPHERAL VASCULAR INTERVENTION Left 01/08/2021   Procedure: PERIPHERAL VASCULAR INTERVENTION;  Surgeon: Cephus Shelling, MD;  [facility];  Service: Cardiovascular;  Laterality: Left;   PERIPHERAL VASCULAR THROMBECTOMY Left 01/16/2020   Procedure: PERIPHERAL VASCULAR THROMBECTOMY;  Surgeon: Cephus Shelling, MD;  [facility];  Service: Cardiovascular;  Laterality: Left;  Lytic Catheter Placement left fem-pop bypass   PERIPHERAL VASCULAR THROMBECTOMY Left 01/17/2020   Procedure: PERIPHERAL VASCULAR THROMBECTOMY;  Surgeon: Cephus Shelling, MD;  [facility];  Service: Cardiovascular;  Laterality: Left;  fem-pt bypass   PERIPHERAL VASCULAR THROMBECTOMY Left 07/10/2020   Procedure: lysis recheck;  Surgeon: Cephus Shelling, MD;  Location: [facility] INVASIVE CV LAB;  Service: Cardiovascular;  Laterality: Left;   PULMONARY THROMBECTOMY N/A 01/08/2021   Procedure: LYSIS RECHECK;  Surgeon: Cephus Shelling, MD;  [facility];  Service: Cardiovascular;  Laterality: N/A;   RIGHT/LEFT HEART CATH AND CORONARY ANGIOGRAPHY N/A 06/05/2018   Procedure: RIGHT/LEFT HEART CATH AND CORONARY ANGIOGRAPHY;  Surgeon: Orpah Cobb, MD;  [facility];  Service: Cardiovascular;  Laterality: N/A;   SPINE SURGERY     TEE WITHOUT CARDIOVERSION N/A 06/12/2018   Procedure: TRANSESOPHAGEAL ECHOCARDIOGRAM (TEE);  Surgeon: Donata Clay, Theron Arista, MD;  Location: [facility] OR;  Service: Open Heart Surgery;  Laterality: N/A;   teeth extractions     THROMBECTOMY FEMORAL ARTERY Left 08/04/2020   Procedure: THROMBECTOMY OF LEFT FEMORAL TP COMPOSTITE BYPASS;  Surgeon: Cephus Shelling, MD;  Location[facility];  Service: Vascular;  Laterality: Left;   THROMBECTOMY ILIAC ARTERY Left 07/10/2020   Procedure: THROMBECTOMY OF LEFT EXTERNAL ILIAC AND LEFT COMMON FEMORAL AND LEFT POSTERIOR TIBIAL ARTERIES;  Surgeon: Cephus Shelling, MD;  Location: [facility];  Service: Vascular;  Laterality: Left;   ULTRASOUND GUIDANCE FOR VASCULAR ACCESS Bilateral 11/23/2021   Procedure: ULTRASOUND GUIDANCE FOR VASCULAR ACCESS;  Surgeon: Cephus Shelling, MD;  Location: [facility] OR;  Service: Vascular;  Laterality: Bilateral;   VEIN HARVEST Left 08/21/2018   Procedure: VEIN HARVEST LEFT GREAT SAPHENOUS VEIN;  Surgeon: Cephus Shelling, MD;  Location: [facility] OR;  Service: Vascular;  Laterality: Left;   VEIN HARVEST Right 11/23/2021   Procedure: VEIN HARVEST;  Surgeon: Cephus Shelling, MD;  Location: [facility] OR;  Service: Vascular;  Laterality: Right;    Family History  Problem Relation Age of Onset   Diabetes Mother    Lung disease Mother    Cancer Father    Heart disease Father     Social History   Socioeconomic History   Marital status: Single    Spouse  name: Not on file   Number of children: 1   Years of education: Not on file   Highest education level: 10th grade  Occupational History   Not on file  Tobacco Use   Smoking status: Some Days    Current packs/day: 1.00    Average packs/day: 1 pack/day for 50.0 years (50.0 ttl pk-yrs)    Types: Cigarettes   Smokeless tobacco: Never   Tobacco comments:    1PPD x 50 years. 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   Vaping Use   Vaping status: Never Used  Substance and Sexual Activity   Alcohol use: Yes    Comment: occasional   Drug use: Yes    Types: Marijuana    Comment: Smokes Marijuana once a week   Sexual activity: Not on file  Other Topics Concern   Not on file  Social History Narrative   Not on file   Social Determinants of Health   Financial Resource Strain: Low Risk  (  01/10/2023)   Overall Financial Resource Strain (CARDIA)    Difficulty of Paying Living Expenses: Not hard at all  Food Insecurity: No Food Insecurity (08/31/2021)   Hunger Vital Sign    Worried About Running Out of Food in the Last Year: Never true    Ran Out of Food in the Last Year: Never true  Transportation Needs: No Transportation Needs (01/10/2023)   PRAPARE - Administrator, Civil Service (Medical): No    Lack of Transportation (Non-Medical): No  Physical Activity: Inactive (01/10/2023)   Exercise Vital Sign    Days of Exercise per Week: 0 days    Minutes of Exercise per Session: 0 min  Stress: No Stress Concern Present (01/10/2023)   [employer] of Occupational Health - Occupational Stress Questionnaire    Feeling of Stress : Only a little  Recent Concern: Stress - Stress Concern Present (01/10/2023)   [employer] of Occupational Health - Occupational Stress Questionnaire    Feeling of Stress : To some extent  Social Connections: Not on file    Allergies  Allergen Reactions   Lisinopril Other (See Comments)    Syncope   Codeine Rash    Outpatient Medications  Prior to Visit  Medication Sig Dispense Refill   aspirin 81 MG EC tablet Take 1 tablet (81 mg total) by mouth daily. Swallow whole.     Blood Glucose Monitoring Suppl (ONETOUCH VERIO) w/Device KIT Use to test blood sugars up to 4 times daily as directed. 1 kit 0   gabapentin (NEURONTIN) 300 MG capsule Take 2 capsules (600 mg total) by mouth at bedtime. 60 capsule 6   glucose blood (ONETOUCH VERIO) test strip Use to test blood sugars up to 4 times daily as directed. 100 each 2   Insulin Pen Needle (TECHLITE PEN NEEDLES) 32G X 4 MM MISC Use as directed with insulin pen 200 each 2   Insulin Syringe-Needle U-100 30G X 5/16" 1 ML MISC Use to inject insulin twice daily. 100 each 3   Lancets (ONETOUCH DELICA PLUS LANCET33G) MISC Use to test blood sugars up to 4 times daily as directed. 100 each 2   losartan (COZAAR) 25 MG tablet Take 0.5 tablets (12.5 mg total) by mouth at bedtime. 45 tablet 3   Multiple Vitamin (MULTIVITAMIN) tablet Take 1 tablet by mouth daily.     albuterol (VENTOLIN HFA) 108 (90 Base) MCG/ACT inhaler Inhale 2 puffs into the lungs every 6 hours as needed for wheezing or shortness of breath. 6.7 g 1   carvedilol (COREG) 6.25 MG tablet Take 1 tablet (6.25 mg total) by mouth 2 (two) times daily with a meal. 180 tablet 0   Dulaglutide (TRULICITY) 1.5 MG/0.5ML SOPN Inject 1.5 mg into the skin once a week. 6 mL 1   furosemide (LASIX) 40 MG tablet Take 1 tablet (40 mg total) by mouth daily. 30 tablet 6   insulin glargine (LANTUS SOLOSTAR) 100 UNIT/ML Solostar Pen Inject 42 Units into the skin daily. 15 mL 2   insulin lispro (HUMALOG KWIKPEN) 100 UNIT/ML KwikPen Inject 5 Units into the skin daily before supper. 3 mL 0   metFORMIN (GLUCOPHAGE) 500 MG tablet Take 2 tablets (1,000 mg total) by mouth 2 (two) times daily with a meal. 360 tablet 0   potassium chloride (KLOR-CON) 10 MEQ tablet Take 1 tablet (10 mEq total) by mouth daily. 90 tablet 1   spironolactone (ALDACTONE) 25 MG tablet Take 0.5  tablets (12.5 mg total) by mouth daily.  45 tablet 0   atorvastatin (LIPITOR) 80 MG tablet Take 1 tablet (80 mg total) by mouth at bedtime. 30 tablet 6   No facility-administered medications prior to visit.     ROS Review of Systems  Constitutional:  Negative for activity change and appetite change.  HENT:  Negative for sinus pressure and sore throat.   Respiratory:  Positive for cough and wheezing. Negative for chest tightness and shortness of breath.   Cardiovascular:  Negative for chest pain and palpitations.  Gastrointestinal:  Negative for abdominal distention, abdominal pain and constipation.  Genitourinary: Negative.   Musculoskeletal:        See HPI  Psychiatric/Behavioral:  Negative for behavioral problems and dysphoric mood.     Objective:  BP 136/77 (BP Location: Left Arm, Patient Position: Sitting, Cuff Size: Large)   Pulse 94   Ht 5\' 9"  (1.753 m)   Wt 231 lb 9.6 oz (105.1 kg)   SpO2 99%   BMI 34.20 kg/m      07/19/2023    2:12 PM 12/09/2022   11:20 AM 12/09/2022   10:04 AM  BP/Weight  Systolic BP 136 136 148  Diastolic BP 77 82 78  Wt. (Lbs) 231.6  227.8  BMI 34.2 kg/m2  33.64 kg/m2    Wt Readings from Last 3 Encounters:  07/19/23 231 lb 9.6 oz (105.1 kg)  12/09/22 227 lb 12.8 oz (103.3 kg)  09/07/22 224 lb 9.6 oz (101.9 kg)      Physical Exam Constitutional:      Appearance: He is well-developed.  Cardiovascular:     Rate and Rhythm: Normal rate.     Heart sounds: Normal heart sounds. No murmur heard. Pulmonary:     Effort: Pulmonary effort is normal.     Breath sounds: Normal breath sounds. No wheezing or rales.  Chest:     Chest wall: No tenderness.  Abdominal:     General: Bowel sounds are normal. There is no distension.     Palpations: Abdomen is soft. There is no mass.     Tenderness: There is no abdominal tenderness.  Musculoskeletal:        General: Normal range of motion.     Right lower leg: No edema.     Left lower leg: No edema.   Neurological:     Mental Status: He is alert and oriented to person, place, and time.  Psychiatric:        Mood and Affect: Mood normal.        Latest Ref Rng & Units 12/09/2022   11:25 AM 09/07/2022    9:27 AM 06/07/2022    8:04 AM  CMP  Glucose 70 - 99 mg/dL 409  811  914   BUN 8 - 27 mg/dL 28  16  27    Creatinine 0.76 - 1.27 mg/dL 7.82  9.56  2.13   Sodium 134 - 144 mmol/L 138  139  135   Potassium 3.5 - 5.2 mmol/L 5.3  5.1  4.9   Chloride 96 - 106 mmol/L 102  98  100   CO2 20 - 29 mmol/L 19  22  22    Calcium 8.6 - 10.2 mg/dL 9.6  08.6  8.6   Total Protein 6.0 - 8.5 g/dL 7.6  8.2    Total Bilirubin 0.0 - 1.2 mg/dL 0.6  0.8    Alkaline Phos 44 - 121 IU/L 75  80    AST 0 - 40 IU/L 15  17  ALT 0 - 44 IU/L 21  19      Lipid Panel     Component Value Date/Time   CHOL 122 09/07/2022 0927   TRIG 87 09/07/2022 0927   HDL 32 (L) 09/07/2022 0927   CHOLHDL 3.8 11/24/2021 0219   VLDL 20 11/24/2021 0219   LDLCALC 73 09/07/2022 0927    CBC    Component Value Date/Time   WBC 16.8 (H) 06/07/2022 0804   RBC 4.25 06/07/2022 0804   HGB 11.8 (L) 06/07/2022 0804   HCT 36.9 (L) 06/07/2022 0804   PLT 278 06/07/2022 0804   MCV 86.8 06/07/2022 0804   MCH 27.8 06/07/2022 0804   MCHC 32.0 06/07/2022 0804   RDW 16.7 (H) 06/07/2022 0804   LYMPHSABS 1.4 06/05/2022 1259   MONOABS 0.4 06/05/2022 1259   EOSABS 0.3 06/05/2022 1259   BASOSABS 0.1 06/05/2022 1259    Lab Results  Component Value Date   HGBA1C 7.7 (A) 07/19/2023    Assessment & Plan:      Type 2 Diabetes Mellitus Improved glycemic control with A1c of 7.7, down from 9.3. Patient reports home glucose readings of 180-200, sometimes higher. Currently on Basaglar 42 units in the morning and Humalog 5 units at night, and Trulicity 1.5mg  weekly. -Increase Trulicity to 3mg  weekly. -Continue Basaglar 42 units daily and Humalog 5 units at night. -Check A1c in 3 months.  Chronic Obstructive Pulmonary Disease (COPD) No formal  diagnosis, he has not had a PFT This is a likely diagnosis given smoking history Reports occasional wheezing and productive cough with clear sputum. Currently using Albuterol as needed. -Start Incruse Ellipta (umeclidinium) inhaler, one puff daily. -Continue Albuterol as needed.  Peripheral Artery Disease Reports leg pain/claudication with superimposed neuropathy.  History of multiple vascular surgeries. -Continue Gabapentin as needed for neuropathy pain. -Smoking cessation strongly encouraged   CAD Status post CABG Secondary risk factor modification  Tobacco Use Disorder Reports decreased smoking, but not complete cessation. History of heavy smoking for 50 years. -Encourage complete cessation of smoking. -Order annual low-dose CT scan for lung cancer screening.  Sinusitis Reports recent sinus symptoms with clear nasal discharge and postnasal drip. -Start Fluticasone nasal spray, one spray in each nostril daily. -Prescribe Zyrtec 10mg  daily.  Memory Concerns Reports occasional forgetfulness, including forgetting to take medications. -Order blood work to assess risk for Alzheimer's disease. -Encourage use of memory aids such as writing things down.  General Health Maintenance -Administer influenza vaccine today.          Meds ordered this encounter  Medications   oxymetazoline (AFRIN NASAL SPRAY) 0.05 % nasal spray    Sig: Place 1 spray into both nostrils 2 (two) times daily.    Dispense:  30 mL    Refill:  1   cetirizine (ZYRTEC) 10 MG tablet    Sig: Take 1 tablet (10 mg total) by mouth daily.    Dispense:  30 tablet    Refill:  1   insulin glargine (LANTUS SOLOSTAR) 100 UNIT/ML Solostar Pen    Sig: Inject 42 Units into the skin daily.    Dispense:  30 mL    Refill:  6   insulin lispro (HUMALOG KWIKPEN) 100 UNIT/ML KwikPen    Sig: Inject 5 Units into the skin daily before supper.    Dispense:  3 mL    Refill:  0    Will need appointment for refills    albuterol (VENTOLIN HFA) 108 (90 Base) MCG/ACT inhaler    Sig:  Inhale 2 puffs into the lungs every 6 hours as needed for wheezing or shortness of breath.    Dispense:  6.7 g    Refill:  1   atorvastatin (LIPITOR) 80 MG tablet    Sig: Take 1 tablet (80 mg total) by mouth at bedtime.    Dispense:  90 tablet    Refill:  1   carvedilol (COREG) 6.25 MG tablet    Sig: Take 1 tablet (6.25 mg total) by mouth 2 (two) times daily with a meal.    Dispense:  180 tablet    Refill:  1   furosemide (LASIX) 40 MG tablet    Sig: Take 1 tablet (40 mg total) by mouth daily.    Dispense:  90 tablet    Refill:  1   metFORMIN (GLUCOPHAGE) 500 MG tablet    Sig: Take 2 tablets (1,000 mg total) by mouth 2 (two) times daily with a meal.    Dispense:  360 tablet    Refill:  1   potassium chloride (KLOR-CON) 10 MEQ tablet    Sig: Take 1 tablet (10 mEq total) by mouth daily.    Dispense:  90 tablet    Refill:  1    Discontinue 20 meq   spironolactone (ALDACTONE) 25 MG tablet    Sig: Take 0.5 tablets (12.5 mg total) by mouth daily.    Dispense:  45 tablet    Refill:  1   Dulaglutide (TRULICITY) 3 MG/0.5ML SOPN    Sig: Inject 3 mg as directed once a week.    Dispense:  2 mL    Refill:  6    Dose increase   umeclidinium bromide (INCRUSE ELLIPTA) 62.5 MCG/ACT AEPB    Sig: Inhale 1 puff into the lungs daily.    Dispense:  30 each    Refill:  6    Follow-up: Return in about 6 months (around 01/16/2024) for Chronic medical conditions.       Hoy Register, MD, FAAFP. [facility] [place], [place] [phone]   07/19/2023, 2:53 PM

## 2023-07-21 ENCOUNTER — Other Ambulatory Visit: Payer: Self-pay

## 2023-07-21 LAB — RPR W/REFLEX TO TREPSURE: RPR: NONREACTIVE

## 2023-07-21 LAB — TREPONEMAL ANTIBODIES, TPPA: Treponemal Antibodies, TPPA: NONREACTIVE

## 2023-07-22 LAB — ATN PROFILE
A -- Beta-amyloid 42/40 Ratio: 0.115 (ref >0.102)
Beta-amyloid 40: 253.6 pg/mL
Beta-amyloid 42: 29.04 pg/mL
N -- NfL, Plasma: 1.98 pg/mL (ref 0.00–4.61)
T -- p-tau181: 0.77 pg/mL (ref 0.00–0.97)

## 2023-07-26 ENCOUNTER — Ambulatory Visit: Payer: Self-pay | Attending: Family Medicine

## 2023-07-26 DIAGNOSIS — R413 Other amnesia: Secondary | ICD-10-CM

## 2023-07-26 LAB — THYROID PANEL WITH TSH
Free Thyroxine Index: 1.7 (ref 1.2–4.9)
T3 Uptake Ratio: 27 % (ref 24–39)
T4, Total: 6.3 ug/dL (ref 4.5–12.0)
TSH: 2.05 u[IU]/mL (ref 0.450–4.500)

## 2023-07-26 LAB — CMP14+EGFR
ALT: 22 IU/L (ref 0–44)
AST: 17 IU/L (ref 0–40)
Albumin: 4.2 g/dL (ref 3.9–4.9)
Alkaline Phosphatase: 76 IU/L (ref 44–121)
BUN/Creatinine Ratio: 12 (ref 10–24)
BUN: 15 mg/dL (ref 8–27)
Bilirubin Total: 0.7 mg/dL (ref 0.0–1.2)
CO2: 19 mmol/L — ABNORMAL LOW (ref 20–29)
Calcium: 9.7 mg/dL (ref 8.6–10.2)
Chloride: 101 mmol/L (ref 96–106)
Creatinine, Ser: 1.21 mg/dL (ref 0.76–1.27)
Globulin, Total: 3.3 g/dL (ref 1.5–4.5)
Glucose: 135 mg/dL — ABNORMAL HIGH (ref 70–99)
Potassium: 4.9 mmol/L (ref 3.5–5.2)
Sodium: 137 mmol/L (ref 134–144)
Total Protein: 7.5 g/dL (ref 6.0–8.5)
eGFR: 66 mL/min/{1.73_m2} (ref >59)

## 2023-07-26 LAB — APOE ALZHEIMER'S RISK

## 2023-07-26 LAB — VITAMIN B12: Vitamin B-12: 283 pg/mL (ref 232–1245)

## 2023-07-26 LAB — VITAMIN B6

## 2023-07-27 LAB — MICROALBUMIN / CREATININE URINE RATIO
Creatinine, Urine: 53.2 mg/dL
Microalb/Creat Ratio: <6 mg/g creat (ref 0–29)
Microalbumin, Urine: <3 ug/mL

## 2023-08-01 LAB — VITAMIN B6: Vitamin B6: 8.7 ug/L (ref 3.4–65.2)

## 2023-08-05 ENCOUNTER — Other Ambulatory Visit: Payer: Self-pay

## 2023-09-06 ENCOUNTER — Other Ambulatory Visit: Payer: Self-pay

## 2023-10-07 ENCOUNTER — Other Ambulatory Visit: Payer: Self-pay

## 2023-10-07 ENCOUNTER — Other Ambulatory Visit: Payer: Self-pay | Admitting: Family Medicine

## 2023-10-07 ENCOUNTER — Other Ambulatory Visit (HOSPITAL_COMMUNITY): Payer: Self-pay

## 2023-10-07 ENCOUNTER — Other Ambulatory Visit (HOSPITAL_BASED_OUTPATIENT_CLINIC_OR_DEPARTMENT_OTHER): Payer: Self-pay

## 2023-10-07 DIAGNOSIS — E1169 Type 2 diabetes mellitus with other specified complication: Secondary | ICD-10-CM

## 2023-10-07 MED ORDER — INSULIN LISPRO (1 UNIT DIAL) 100 UNIT/ML (KWIKPEN)
5.0000 [IU] | PEN_INJECTOR | Freq: Every day | SUBCUTANEOUS | 0 refills | Status: DC
Start: 2023-10-07 — End: 2024-02-27
  Filled 2023-10-07: qty 3, 28d supply, fill #0
  Filled 2023-11-24: qty 3, 28d supply, fill #1
  Filled 2024-01-12: qty 3, 28d supply, fill #2

## 2023-11-24 ENCOUNTER — Other Ambulatory Visit: Payer: Self-pay

## 2023-11-24 ENCOUNTER — Other Ambulatory Visit: Payer: Self-pay | Admitting: Family Medicine

## 2023-11-24 MED ORDER — TRUEPLUS 5-BEVEL PEN NEEDLES 32G X 4 MM MISC
1.0000 | 2 refills | Status: AC | PRN
  Filled 2023-11-24 – 2024-01-12 (×2): qty 100, 100d supply, fill #0
  Filled 2024-07-12: qty 100, 100d supply, fill #1

## 2023-11-25 ENCOUNTER — Other Ambulatory Visit: Payer: Self-pay

## 2023-12-06 ENCOUNTER — Other Ambulatory Visit: Payer: Self-pay

## 2024-01-12 ENCOUNTER — Other Ambulatory Visit: Payer: Self-pay

## 2024-01-13 ENCOUNTER — Other Ambulatory Visit: Payer: Self-pay

## 2024-01-16 ENCOUNTER — Ambulatory Visit: Payer: Self-pay | Admitting: Family Medicine

## 2024-02-27 ENCOUNTER — Other Ambulatory Visit: Payer: Self-pay

## 2024-02-27 ENCOUNTER — Other Ambulatory Visit: Payer: Self-pay | Admitting: Family Medicine

## 2024-02-27 DIAGNOSIS — E1169 Type 2 diabetes mellitus with other specified complication: Secondary | ICD-10-CM

## 2024-02-28 ENCOUNTER — Other Ambulatory Visit: Payer: Self-pay

## 2024-02-28 MED ORDER — INSULIN LISPRO (1 UNIT DIAL) 100 UNIT/ML (KWIKPEN)
5.0000 [IU] | PEN_INJECTOR | Freq: Every day | SUBCUTANEOUS | 0 refills | Status: DC
  Filled 2024-02-28: qty 3, 28d supply, fill #0
  Filled 2024-04-03: qty 3, 28d supply, fill #1

## 2024-02-29 ENCOUNTER — Other Ambulatory Visit: Payer: Self-pay

## 2024-02-29 ENCOUNTER — Telehealth: Payer: Self-pay | Admitting: Family Medicine

## 2024-02-29 NOTE — Telephone Encounter (Signed)
 Medication has been refilled on 02/28/2024

## 2024-02-29 NOTE — Telephone Encounter (Signed)
 Copied from CRM [id number]. Topic: Clinical - Prescription Issue >> Feb 29, 2024  8:31 AM Robert Sanchez wrote: Reason for CRM:  Patient has not gotten his refill of his insulin  and hasn't been informed of why, states usually its very quick for Sanchez to get it refilled and he's been out for 3 days.

## 2024-02-29 NOTE — Telephone Encounter (Signed)
 Copied from CRM [id number]. Topic: Clinical - Prescription Issue >> Feb 29, 2024  8:31 AM Zipporah Him wrote: Reason for CRM:  Patient has not gotten his refill of his insulin  and hasn't been informed of why, states usually its very quick for him to get it refilled and he's been out for 3 days.

## 2024-04-03 ENCOUNTER — Other Ambulatory Visit: Payer: Self-pay

## 2024-04-04 ENCOUNTER — Other Ambulatory Visit: Payer: Self-pay

## 2024-04-24 ENCOUNTER — Other Ambulatory Visit: Payer: Self-pay

## 2024-04-24 ENCOUNTER — Encounter: Payer: Self-pay | Admitting: Family Medicine

## 2024-04-24 ENCOUNTER — Ambulatory Visit: Payer: Self-pay | Attending: Family Medicine | Admitting: Family Medicine

## 2024-04-24 VITALS — BP 133/68 | HR 79 | Ht 69.0 in | Wt 203.4 lb

## 2024-04-24 DIAGNOSIS — E1149 Type 2 diabetes mellitus with other diabetic neurological complication: Secondary | ICD-10-CM

## 2024-04-24 DIAGNOSIS — F17210 Nicotine dependence, cigarettes, uncomplicated: Secondary | ICD-10-CM

## 2024-04-24 DIAGNOSIS — Z1211 Encounter for screening for malignant neoplasm of colon: Secondary | ICD-10-CM

## 2024-04-24 DIAGNOSIS — Z7985 Long-term (current) use of injectable non-insulin antidiabetic drugs: Secondary | ICD-10-CM

## 2024-04-24 DIAGNOSIS — E1169 Type 2 diabetes mellitus with other specified complication: Secondary | ICD-10-CM

## 2024-04-24 DIAGNOSIS — I10 Essential (primary) hypertension: Secondary | ICD-10-CM

## 2024-04-24 DIAGNOSIS — Z794 Long term (current) use of insulin: Secondary | ICD-10-CM

## 2024-04-24 DIAGNOSIS — J439 Emphysema, unspecified: Secondary | ICD-10-CM | POA: Insufficient documentation

## 2024-04-24 DIAGNOSIS — I739 Peripheral vascular disease, unspecified: Secondary | ICD-10-CM

## 2024-04-24 DIAGNOSIS — Z7984 Long term (current) use of oral hypoglycemic drugs: Secondary | ICD-10-CM

## 2024-04-24 DIAGNOSIS — Z125 Encounter for screening for malignant neoplasm of prostate: Secondary | ICD-10-CM

## 2024-04-24 DIAGNOSIS — I152 Hypertension secondary to endocrine disorders: Secondary | ICD-10-CM

## 2024-04-24 DIAGNOSIS — I25810 Atherosclerosis of coronary artery bypass graft(s) without angina pectoris: Secondary | ICD-10-CM

## 2024-04-24 DIAGNOSIS — E785 Hyperlipidemia, unspecified: Secondary | ICD-10-CM

## 2024-04-24 LAB — POCT GLYCOSYLATED HEMOGLOBIN (HGB A1C): HbA1c, POC (controlled diabetic range): 6.2 % (ref 0.0–7.0)

## 2024-04-24 MED ORDER — LOSARTAN POTASSIUM 25 MG PO TABS
12.5000 mg | ORAL_TABLET | Freq: Every day | ORAL | 3 refills | Status: DC
  Filled 2024-04-24: qty 15, 30d supply, fill #0
  Filled 2024-07-19: qty 45, 90d supply, fill #0

## 2024-04-24 MED ORDER — METFORMIN HCL 500 MG PO TABS
1000.0000 mg | ORAL_TABLET | Freq: Two times a day (BID) | ORAL | 1 refills | Status: AC
Start: 2024-04-24 — End: ?
  Filled 2024-04-24 – 2024-07-19 (×2): qty 360, 90d supply, fill #0

## 2024-04-24 MED ORDER — ALBUTEROL SULFATE HFA 108 (90 BASE) MCG/ACT IN AERS
2.0000 | INHALATION_SPRAY | Freq: Four times a day (QID) | RESPIRATORY_TRACT | 1 refills | Status: AC | PRN
  Filled 2024-04-24: qty 6.7, 25d supply, fill #0

## 2024-04-24 MED ORDER — LANTUS SOLOSTAR 100 UNIT/ML ~~LOC~~ SOPN
42.0000 [IU] | PEN_INJECTOR | Freq: Every day | SUBCUTANEOUS | 6 refills | Status: DC
  Filled 2024-04-24 – 2024-05-10 (×2): qty 12, 28d supply, fill #0
  Filled 2024-07-12: qty 12, 28d supply, fill #1
  Filled 2024-08-27: qty 12, 28d supply, fill #2
  Filled 2024-10-16: qty 12, 28d supply, fill #3

## 2024-04-24 MED ORDER — SPIRONOLACTONE 25 MG PO TABS
12.5000 mg | ORAL_TABLET | Freq: Every day | ORAL | 1 refills | Status: AC
  Filled 2024-04-24 – 2024-05-10 (×2): qty 45, 90d supply, fill #0

## 2024-04-24 MED ORDER — ATORVASTATIN CALCIUM 80 MG PO TABS
80.0000 mg | ORAL_TABLET | Freq: Every day | ORAL | 1 refills | Status: AC
  Filled 2024-04-24 – 2024-07-19 (×2): qty 90, 90d supply, fill #0

## 2024-04-24 MED ORDER — TRULICITY 3 MG/0.5ML ~~LOC~~ SOAJ
3.0000 mg | SUBCUTANEOUS | 6 refills | Status: AC
  Filled 2024-04-24 – 2024-05-10 (×2): qty 2, 28d supply, fill #0
  Filled 2024-07-19: qty 2, 28d supply, fill #1
  Filled 2024-08-27: qty 2, 28d supply, fill #2
  Filled 2024-10-16: qty 2, 28d supply, fill #3
  Filled 2024-11-22: qty 2, 28d supply, fill #4

## 2024-04-24 MED ORDER — INSULIN LISPRO (1 UNIT DIAL) 100 UNIT/ML (KWIKPEN)
5.0000 [IU] | PEN_INJECTOR | Freq: Every day | SUBCUTANEOUS | 6 refills | Status: AC
  Filled 2024-04-24: qty 3, 28d supply, fill #0
  Filled 2024-05-10: qty 3, 60d supply, fill #0
  Filled 2024-07-12: qty 3, 28d supply, fill #1
  Filled 2024-08-27: qty 3, 28d supply, fill #2
  Filled 2024-10-16: qty 3, 28d supply, fill #3
  Filled 2024-11-22: qty 3, 28d supply, fill #4

## 2024-04-24 MED ORDER — CARVEDILOL 6.25 MG PO TABS
6.2500 mg | ORAL_TABLET | Freq: Two times a day (BID) | ORAL | 1 refills | Status: AC
Start: 2024-04-24 — End: ?
  Filled 2024-04-24 – 2024-05-10 (×2): qty 180, 90d supply, fill #0

## 2024-04-24 MED ORDER — POTASSIUM CHLORIDE ER 10 MEQ PO TBCR
10.0000 meq | EXTENDED_RELEASE_TABLET | Freq: Every day | ORAL | 1 refills | Status: DC
  Filled 2024-04-24 – 2024-07-19 (×2): qty 30, 30d supply, fill #0

## 2024-04-24 MED ORDER — GABAPENTIN 300 MG PO CAPS
600.0000 mg | ORAL_CAPSULE | Freq: Every day | ORAL | 1 refills | Status: DC
Start: 2024-04-24 — End: 2024-08-28
  Filled 2024-04-24: qty 180, 90d supply, fill #0

## 2024-04-24 MED ORDER — UMECLIDINIUM BROMIDE 62.5 MCG/ACT IN AEPB
1.0000 | INHALATION_SPRAY | Freq: Every day | RESPIRATORY_TRACT | 6 refills | Status: DC
  Filled 2024-04-24: qty 30, 30d supply, fill #0

## 2024-04-24 MED ORDER — FUROSEMIDE 40 MG PO TABS
40.0000 mg | ORAL_TABLET | Freq: Every day | ORAL | 1 refills | Status: DC
  Filled 2024-04-24: qty 90, 90d supply, fill #0

## 2024-04-24 NOTE — Progress Notes (Signed)
 Subjective:  Patient ID: Robert Sanchez, male    DOB: [birth year]  Age: 68 y.o. MRN: [id number]  CC: Medical Management of Chronic Issues     Discussed the use of AI scribe software for clinical note transcription with the patient, who gave verbal consent to proceed.  History of Present Illness Robert Sanchez is a 68 year old male with a history of Type 2 DM, CAD s/p CABG x3, PVD status post thrombectomy of left common femoral to PT composite bypass, with retrograde iliac angioplasty and revision of distal bypass to below the knee popliteal artery/TP trunk with a bovine pericardial patch angioplasty, subsequent thrombosis and angioplasty of distal anastomosis, partial L big toe amputation in 07/2020.  In 11/2021 he did undergo left femoral endarterectomy with profundoplasty bovine patch and a left common femoral to anterior tibial bypass with composite PTFE and great saphenous vein, tobacco abuse (1 pack/day x 50 years), on 3L oxygen (at night ), Emphysema who presents for follow-up of his chronic conditions.  His diabetes management has improved significantly, with A1c decreasing from 7.7 to 6.2. He has lost 28 pounds over nine months, which has contributed to better diabetes control. He administers 42 units of Lantus  in the morning and 5 units before dinner, takes metformin  1000 mg twice daily, and uses Trulicity  3 mg once weekly. He experiences no hypoglycemia, dizziness, or lightheadedness.  For COPD he uses an Incruse inhaler daily, which has improved breathing and reduced wheezing, though he sometimes skips doses. Hot weather exacerbates symptoms.  He has no claudication and has not followed up with vascular or recent but decreased sensation due to neuropathy, which has progressed over the years. He has been out of gabapentin  for neuropathy pain and requests a refill.  He has a significant smoking history of about 50 pack years but is not ready to quit.  He has reduced smoking but  occasionally uses cigars.  Referred for low-dose CT for lung cancer screening but he never followed through with this.  He has not had a colonoscopy for colon cancer screening and is willing to receive a referral.  He is experiencing financial difficulties, having recently quit working and awaiting Social Security benefits. He has applied for financial assistance through [employer] for medical expenses and medication costs.    Past Medical History:  Diagnosis Date   Acute pulmonary edema (HCC) 06/05/2018   Arthritis    CHF (congestive heart failure) (HCC)    COPD (chronic obstructive pulmonary disease) (HCC)     beginning stages  per pt   Coronary artery disease    Diabetes mellitus without complication (HCC)    type 2   Diabetic neuropathy (HCC)    Diabetic neuropathy (HCC)    Emphysema/COPD (HCC)     beginning stages   GERD (gastroesophageal reflux disease)    HLD (hyperlipidemia)    HOH (hard of hearing)    no hearing aids   Myocardial infarction [facility])    Peripheral vascular disease (HCC)     Past Surgical History:  Procedure Laterality Date   ABDOMINAL AORTOGRAM W/LOWER EXTREMITY N/A 06/21/2018   Procedure: ABDOMINAL AORTOGRAM W/LOWER EXTREMITY;  Surgeon: Gretta Lonni PARAS, MD;  Location: [facility];  Service: Cardiovascular;  Laterality: N/A;   ABDOMINAL AORTOGRAM W/LOWER EXTREMITY Left 01/16/2020   Procedure: ABDOMINAL AORTOGRAM W/LOWER EXTREMITY;  Surgeon: Gretta Lonni PARAS, MD;  Location: [facility];  Service: Cardiovascular;  Laterality: Left;   ABDOMINAL AORTOGRAM W/LOWER EXTREMITY N/A 07/09/2020  Procedure: ABDOMINAL AORTOGRAM W/LOWER EXTREMITY;  Surgeon: Gretta Lonni PARAS, MD;  Location: [facility] INVASIVE CV LAB;  Service: Cardiovascular;  Laterality: N/A;  TPA infusion   ABDOMINAL AORTOGRAM W/LOWER EXTREMITY Bilateral 11/20/2021   Procedure: ABDOMINAL AORTOGRAM W/LOWER EXTREMITY;  Surgeon: Magda Debby SAILOR, MD;  Location: [facility];   Service: Cardiovascular;  Laterality: Bilateral;   AMPUTATION Left 08/04/2020   Procedure: LEFT PARTIAL GREAT TOE AMPUTATION;  Surgeon: Gretta Lonni PARAS, MD;  Location: [facility] OR;  Service: Vascular;  Laterality: Left;   ANGIOPLASTY Left 11/23/2021   Procedure: ANGIOPLASTY with BOVINE PATCH 1cmx6cm;  Surgeon: Gretta Lonni PARAS, MD;  Location: [facility] OR;  Service: Vascular;  Laterality: Left;   APPLICATION OF WOUND VAC Left 08/04/2020   Procedure: LEFT GROIN DEBRIDEMENT WITH APPLICATION OF WOUND VAC;  Surgeon: Gretta Lonni PARAS, MD;  Location: [facility] OR;  Service: Vascular;  Laterality: Left;   CORONARY ARTERY BYPASS GRAFT N/A 06/12/2018   Procedure: CORONARY ARTERY BYPASS GRAFTING (CABG) x 3 WITH ENDOSCOPIC HARVESTING OF RIGHT GREATER SAPHENOUS VEIN. LIMA TO LAD. SVG TO PD. SVG TO DIAGONAL.;  Surgeon: Fleeta Hanford Coy, MD;  Location: [facility] OR;  Service: Open Heart Surgery;  Laterality: N/A;   ENDARTERECTOMY FEMORAL Left 11/23/2021   Procedure: ENDARTERECTOMY FEMORAL;  Surgeon: Gretta Lonni PARAS, MD;  Location: [facility] OR;  Service: Vascular;  Laterality: Left;   FEMORAL-POPLITEAL BYPASS GRAFT Left 07/10/2020   Procedure: REDO EXPOSURE OF LEFT COMMON FEMORAL ARTERY LEFT COMMON FEMORAL TO POSTERIOR TIBIAL COMPOSITE BYPASS GRAFT;  Surgeon: Gretta Lonni PARAS, MD;  Location: [facility] OR;  Service: Vascular;  Laterality: Left;   FEMORAL-TIBIAL BYPASS GRAFT Left 08/21/2018   Procedure: BYPASS GRAFT LEFT FEMORAL TO POSTERIOR TIBIAL ARTERY USING LEFT REVERSED GREAT SAPHENOUS VEIN;  Surgeon: Gretta Lonni PARAS, MD;  Location: [facility] OR;  Service: Vascular;  Laterality: Left;   FEMORAL-TIBIAL BYPASS GRAFT Left 08/04/2020   Procedure: REVISION FEMORAL-DISTAL BYPASS WITH BIOLOGICAL BOVINE PATCH;  Surgeon: Gretta Lonni PARAS, MD;  Location: [facility] OR;  Service: Vascular;  Laterality: Left;   FEMORAL-TIBIAL BYPASS GRAFT Left 11/23/2021   Procedure: REDO LEFT FEMORAL-TIBIAL ARTERY BYPASS GRAFT USING PROPATEN 6mm VASCULAR GRAFT REMOVABLE  RING;  Surgeon: Gretta Lonni PARAS, MD;  Location: [facility] OR;  Service: Vascular;  Laterality: Left;  Insert arterial line   KNEE ARTHROSCOPY     LOWER EXTREMITY ANGIOGRAM Left 08/04/2020   Procedure: LEFT ILIAC ANGIOGRAM, ANGIOPLASTY OF LEFT ILIAC STENT WITH DRUG COATED BALLOON;  Surgeon: Gretta Lonni PARAS, MD;  [facility];  Service: Vascular;  Laterality: Left;   LOWER EXTREMITY ANGIOGRAPHY Left 01/17/2020   Procedure: Lower Extremity Angiography;  Surgeon: Gretta Lonni PARAS, MD;  Location: [facility] INVASIVE CV LAB;  Service: Cardiovascular;  Laterality: Left;   LOWER EXTREMITY ANGIOGRAPHY N/A 01/07/2021   Procedure: LOWER EXTREMITY ANGIOGRAPHY;  Surgeon: Magda Debby SAILOR, MD;  [facility];  Service: Cardiovascular;  Laterality: N/A;   PERIPHERAL VASCULAR BALLOON ANGIOPLASTY Left 01/17/2020   Procedure: PERIPHERAL VASCULAR BALLOON ANGIOPLASTY;  Surgeon: Gretta Lonni PARAS, MD;  [facility];  Service: Cardiovascular;  Laterality: Left;  pt   PERIPHERAL VASCULAR INTERVENTION Left 06/21/2018   Procedure: PERIPHERAL VASCULAR INTERVENTION;  Surgeon: Gretta Lonni PARAS, MD;  [facility];  Service: Cardiovascular;  Laterality: Left;   PERIPHERAL VASCULAR INTERVENTION Left 01/08/2021   Procedure: PERIPHERAL VASCULAR INTERVENTION;  Surgeon: Gretta Lonni PARAS, MD;  [facility];  Service: Cardiovascular;  Laterality: Left;   PERIPHERAL VASCULAR THROMBECTOMY  Left 01/16/2020   Procedure: PERIPHERAL VASCULAR THROMBECTOMY;  Surgeon: Gretta Lonni PARAS, MD;  Location: [facility] INVASIVE CV LAB;  Service: Cardiovascular;  Laterality: Left;  Lytic Catheter Placement left fem-pop bypass   PERIPHERAL VASCULAR THROMBECTOMY Left 01/17/2020   Procedure: PERIPHERAL VASCULAR THROMBECTOMY;  Surgeon: Gretta Lonni PARAS, MD;  [facility];  Service: Cardiovascular;  Laterality: Left;  fem-pt bypass   PERIPHERAL VASCULAR THROMBECTOMY Left  07/10/2020   Procedure: lysis recheck;  Surgeon: Gretta Lonni PARAS, MD;  Location: [facility] INVASIVE CV LAB;  Service: Cardiovascular;  Laterality: Left;   PULMONARY THROMBECTOMY N/A 01/08/2021   Procedure: LYSIS RECHECK;  Surgeon: Gretta Lonni PARAS, MD;  [facility];  Service: Cardiovascular;  Laterality: N/A;   RIGHT/LEFT HEART CATH AND CORONARY ANGIOGRAPHY N/A 06/05/2018   Procedure: RIGHT/LEFT HEART CATH AND CORONARY ANGIOGRAPHY;  Surgeon: Claudene Pacific, MD;  [facility];  Service: Cardiovascular;  Laterality: N/A;   SPINE SURGERY     TEE WITHOUT CARDIOVERSION N/A 06/12/2018   Procedure: TRANSESOPHAGEAL ECHOCARDIOGRAM (TEE);  Surgeon: Fleeta Ochoa, Maude, MD;  Location: [facility] OR;  Service: Open Heart Surgery;  Laterality: N/A;   teeth extractions     THROMBECTOMY FEMORAL ARTERY Left 08/04/2020   Procedure: THROMBECTOMY OF LEFT FEMORAL TP COMPOSTITE BYPASS;  Surgeon: Gretta Lonni PARAS, MD;  Location[facility];  Service: Vascular;  Laterality: Left;   THROMBECTOMY ILIAC ARTERY Left 07/10/2020   Procedure: THROMBECTOMY OF LEFT EXTERNAL ILIAC AND LEFT COMMON FEMORAL AND LEFT POSTERIOR TIBIAL ARTERIES;  Surgeon: Gretta Lonni PARAS, MD;  Location: [facility] OR;  Service: Vascular;  Laterality: Left;   ULTRASOUND GUIDANCE FOR VASCULAR ACCESS Bilateral 11/23/2021   Procedure: ULTRASOUND GUIDANCE FOR VASCULAR ACCESS;  Surgeon: Gretta Lonni PARAS, MD;  Location: [facility] OR;  Service: Vascular;  Laterality: Bilateral;   VEIN HARVEST Left 08/21/2018   Procedure: VEIN HARVEST LEFT GREAT SAPHENOUS VEIN;  Surgeon: Gretta Lonni PARAS, MD;  Location: [facility] OR;  Service: Vascular;  Laterality: Left;   VEIN HARVEST Right 11/23/2021   Procedure: VEIN HARVEST;  Surgeon: Gretta Lonni PARAS, MD;  Location: [facility] OR;  Service: Vascular;  Laterality: Right;    Family History  Problem Relation Age of Onset   Diabetes Mother    Lung disease Mother    Cancer Father    Heart disease Father     Social  History   Socioeconomic History   Marital status: Single    Spouse name: Not on file   Number of children: 1   Years of education: Not on file   Highest education level: 10th grade  Occupational History   Not on file  Tobacco Use   Smoking status: Some Days    Current packs/day: 1.00    Average packs/day: 1 pack/day for 50.0 years (50.0 ttl pk-yrs)    Types: Cigarettes   Smokeless tobacco: Never   Tobacco comments:    1PPD x 50 years. 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   Vaping Use   Vaping status: Never Used  Substance and Sexual Activity   Alcohol use: Yes    Comment: occasional   Drug use: Yes    Types: Marijuana    Comment: Smokes Marijuana once a week   Sexual activity: Not on file  Other Topics Concern   Not on file  Social History Narrative   Not on file   Social Drivers of Health   Financial Resource Strain: Low Risk  (01/10/2023)   Overall Physicist, medical Strain (  CARDIA)    Difficulty of Paying Living Expenses: Not hard at all  Food Insecurity: No Food Insecurity (08/31/2021)   Hunger Vital Sign    Worried About Running Out of Food in the Last Year: Never true    Ran Out of Food in the Last Year: Never true  Transportation Needs: No Transportation Needs (01/10/2023)   PRAPARE - Administrator, Civil Service (Medical): No    Lack of Transportation (Non-Medical): No  Physical Activity: Inactive (01/10/2023)   Exercise Vital Sign    Days of Exercise per Week: 0 days    Minutes of Exercise per Session: 0 min  Stress: No Stress Concern Present (01/10/2023)   [employer] of Occupational Health - Occupational Stress Questionnaire    Feeling of Stress : Only a little  Recent Concern: Stress - Stress Concern Present (01/10/2023)   [employer] of Occupational Health - Occupational Stress Questionnaire    Feeling of Stress : To some extent  Social Connections: Not on file    Allergies  Allergen Reactions   Lisinopril  Other (See  Comments)    Syncope   Codeine Rash    Outpatient Medications Prior to Visit  Medication Sig Dispense Refill   aspirin  81 MG EC tablet Take 1 tablet (81 mg total) by mouth daily. Swallow whole.     Blood Glucose Monitoring Suppl (ONETOUCH VERIO) w/Device KIT Use to test blood sugars up to 4 times daily as directed. 1 kit 0   cetirizine  (ZYRTEC ) 10 MG tablet Take 1 tablet (10 mg total) by mouth daily. 30 tablet 1   glucose blood (ONETOUCH VERIO) test strip Use to test blood sugars up to 4 times daily as directed. 100 each 2   Insulin  Pen Needle (TRUEPLUS 5-BEVEL PEN NEEDLES) 32G X 4 MM MISC Use as directed with insulin  pen 200 each 2   Insulin  Syringe-Needle U-100 30G X 5/16 1 ML MISC Use to inject insulin  twice daily. 100 each 3   Lancets (ONETOUCH DELICA PLUS LANCET33G) MISC Use to test blood sugars up to 4 times daily as directed. 100 each 2   Multiple Vitamin (MULTIVITAMIN) tablet Take 1 tablet by mouth daily.     oxymetazoline  (AFRIN NASAL SPRAY) 0.05 % nasal spray Place 1 spray into both nostrils 2 (two) times daily. 30 mL 1   albuterol  (VENTOLIN  HFA) 108 (90 Base) MCG/ACT inhaler Inhale 2 puffs into the lungs every 6 hours as needed for wheezing or shortness of breath. 6.7 g 1   atorvastatin  (LIPITOR ) 80 MG tablet Take 1 tablet (80 mg total) by mouth at bedtime. 90 tablet 1   carvedilol  (COREG ) 6.25 MG tablet Take 1 tablet (6.25 mg total) by mouth 2 (two) times daily with a meal. 180 tablet 1   Dulaglutide  (TRULICITY ) 3 MG/0.5ML SOAJ Inject 3 mg as directed once a week. 2 mL 6   furosemide  (LASIX ) 40 MG tablet Take 1 tablet (40 mg total) by mouth daily. 90 tablet 1   gabapentin  (NEURONTIN ) 300 MG capsule Take 2 capsules (600 mg total) by mouth at bedtime. 60 capsule 6   insulin  glargine (LANTUS  SOLOSTAR) 100 UNIT/ML Solostar Pen Inject 42 Units into the skin daily. 30 mL 6   insulin  lispro (HUMALOG  KWIKPEN) 100 UNIT/ML KwikPen Inject 5 Units into the skin daily before supper. 9 mL 0    losartan  (COZAAR ) 25 MG tablet Take 0.5 tablets (12.5 mg total) by mouth at bedtime. 45 tablet 3   metFORMIN  (GLUCOPHAGE ) 500 MG  tablet Take 2 tablets (1,000 mg total) by mouth 2 (two) times daily with a meal. 360 tablet 1   potassium chloride  (KLOR-CON ) 10 MEQ tablet Take 1 tablet (10 mEq total) by mouth daily. 90 tablet 1   spironolactone  (ALDACTONE ) 25 MG tablet Take 0.5 tablets (12.5 mg total) by mouth daily. 45 tablet 1   umeclidinium bromide  (INCRUSE ELLIPTA ) 62.5 MCG/ACT AEPB Inhale 1 puff into the lungs daily. 30 each 6   No facility-administered medications prior to visit.     ROS Review of Systems  Constitutional:  Negative for activity change and appetite change.  HENT:  Negative for sinus pressure and sore throat.   Respiratory:  Negative for chest tightness, shortness of breath and wheezing.   Cardiovascular:  Negative for chest pain and palpitations.  Gastrointestinal:  Negative for abdominal distention, abdominal pain and constipation.  Genitourinary: Negative.   Musculoskeletal: Negative.   Neurological:  Positive for numbness.  Psychiatric/Behavioral:  Negative for behavioral problems and dysphoric mood.     Objective:  BP 133/68   Pulse 79   Ht 5' 9 (1.753 m)   Wt 203 lb 6.4 oz (92.3 kg)   SpO2 96%   BMI 30.04 kg/m      04/24/2024    8:56 AM 07/19/2023    2:12 PM 12/09/2022   11:20 AM  BP/Weight  Systolic BP 133 136 136  Diastolic BP 68 77 82  Wt. (Lbs) 203.4 231.6   BMI 30.04 kg/m2 34.2 kg/m2     Wt Readings from Last 3 Encounters:  04/24/24 203 lb 6.4 oz (92.3 kg)  07/19/23 231 lb 9.6 oz (105.1 kg)  12/09/22 227 lb 12.8 oz (103.3 kg)      Physical Exam Constitutional:      Appearance: He is well-developed.   Cardiovascular:     Rate and Rhythm: Normal rate.     Heart sounds: Normal heart sounds. No murmur heard. Pulmonary:     Effort: Pulmonary effort is normal.     Breath sounds: Normal breath sounds. No wheezing or rales.  Chest:      Chest wall: No tenderness.  Abdominal:     General: Bowel sounds are normal. There is no distension.     Palpations: Abdomen is soft. There is no mass.     Tenderness: There is no abdominal tenderness.   Musculoskeletal:        General: Normal range of motion.     Right lower leg: No edema.     Left lower leg: No edema.   Neurological:     Mental Status: He is alert and oriented to person, place, and time.   Psychiatric:        Mood and Affect: Mood normal.    Diabetic Foot Exam - Simple   Simple Foot Form Diabetic Foot exam was performed with the following findings: Yes 04/24/2024  9:14 AM  Visual Inspection Sensation Testing Pulse Check Comments Left great toe amputation. No calluses or ulcers on both feet. Decreased monofilament testing in left foot, right is normal Nonpalpable posterior tibialis and dorsalis pedis bilaterally.        Latest Ref Rng & Units 07/19/2023    4:45 PM 12/09/2022   11:25 AM 09/07/2022    9:27 AM  CMP  Glucose 70 - 99 mg/dL 864  869  860   BUN 8 - 27 mg/dL 15  28  16    Creatinine 0.76 - 1.27 mg/dL 8.78  8.93  8.71   Sodium  134 - 144 mmol/L 137  138  139   Potassium 3.5 - 5.2 mmol/L 4.9  5.3  5.1   Chloride 96 - 106 mmol/L 101  102  98   CO2 20 - 29 mmol/L 19  19  22    Calcium  8.6 - 10.2 mg/dL 9.7  9.6  89.8   Total Protein 6.0 - 8.5 g/dL 7.5  7.6  8.2   Total Bilirubin 0.0 - 1.2 mg/dL 0.7  0.6  0.8   Alkaline Phos 44 - 121 IU/L 76  75  80   AST 0 - 40 IU/L 17  15  17    ALT 0 - 44 IU/L 22  21  19      Lipid Panel     Component Value Date/Time   CHOL 122 09/07/2022 0927   TRIG 87 09/07/2022 0927   HDL 32 (L) 09/07/2022 0927   CHOLHDL 3.8 11/24/2021 0219   VLDL 20 11/24/2021 0219   LDLCALC 73 09/07/2022 0927    CBC    Component Value Date/Time   WBC 16.8 (H) 06/07/2022 0804   RBC 4.25 06/07/2022 0804   HGB 11.8 (L) 06/07/2022 0804   HCT 36.9 (L) 06/07/2022 0804   PLT 278 06/07/2022 0804   MCV 86.8 06/07/2022 0804   MCH 27.8  06/07/2022 0804   MCHC 32.0 06/07/2022 0804   RDW 16.7 (H) 06/07/2022 0804   LYMPHSABS 1.4 06/05/2022 1259   MONOABS 0.4 06/05/2022 1259   EOSABS 0.3 06/05/2022 1259   BASOSABS 0.1 06/05/2022 1259    Lab Results  Component Value Date   HGBA1C 6.2 04/24/2024    Lab Results  Component Value Date   HGBA1C 6.2 04/24/2024   HGBA1C 7.7 (A) 07/19/2023   HGBA1C 9.3 (A) 12/09/2022      1. Type 2 diabetes mellitus with other specified complication, with long-term current use of insulin  (HCC) (Primary) Controlled with A1c of 6.2 Continue current regimen Counseled on Diabetic diet, my plate method, 849 minutes of moderate intensity exercise/week Blood sugar logs with fasting goals of 80-120 mg/dl, random of less than 819 and in the event of sugars less than 60 mg/dl or greater than 599 mg/dl encouraged to notify the clinic. Advised on the need for annual eye exams, annual foot exams, Pneumonia vaccine. - POCT glycosylated hemoglobin (Hb A1C) - CMP14+EGFR - LP+Non-HDL Cholesterol - Dulaglutide  (TRULICITY ) 3 MG/0.5ML SOAJ; Inject 3 mg as directed once a week.  Dispense: 2 mL; Refill: 6 - insulin  glargine (LANTUS  SOLOSTAR) 100 UNIT/ML Solostar Pen; Inject 42 Units into the skin daily.  Dispense: 30 mL; Refill: 6 - insulin  lispro (HUMALOG  KWIKPEN) 100 UNIT/ML KwikPen; Inject 5 Units into the skin daily before supper.  Dispense: 9 mL; Refill: 6 - metFORMIN  (GLUCOPHAGE ) 500 MG tablet; Take 2 tablets (1,000 mg total) by mouth 2 (two) times daily with a meal.  Dispense: 360 tablet; Refill: 1  2. Pulmonary emphysema, unspecified emphysema type (HCC) Improved since initiation of Incruse Advised that smoking cessation will be beneficial in retarding progression - albuterol  (VENTOLIN  HFA) 108 (90 Base) MCG/ACT inhaler; Inhale 2 puffs into the lungs every 6 hours as needed for wheezing or shortness of breath.  Dispense: 6.7 g; Refill: 1 - umeclidinium bromide  (INCRUSE ELLIPTA ) 62.5 MCG/ACT AEPB;  Inhale 1 puff into the lungs daily.  Dispense: 30 each; Refill: 6  3. Coronary artery disease involving coronary bypass graft of native heart without angina pectoris Status post CABG Asymptomatic Risk factor modification including smoking cessation, blood pressure  and blood sugar control Continue high intensity statin - atorvastatin  (LIPITOR ) 80 MG tablet; Take 1 tablet (80 mg total) by mouth at bedtime.  Dispense: 90 tablet; Refill: 1 - carvedilol  (COREG ) 6.25 MG tablet; Take 1 tablet (6.25 mg total) by mouth 2 (two) times daily with a meal.  Dispense: 180 tablet; Refill: 1  4. PAD (peripheral artery disease) (HCC) Status post intervention Advised that ongoing smoking is detrimental towards his PAD but he is nonchalant about this - furosemide  (LASIX ) 40 MG tablet; Take 1 tablet (40 mg total) by mouth daily.  Dispense: 90 tablet; Refill: 1 - potassium chloride  (KLOR-CON ) 10 MEQ tablet; Take 1 tablet (10 mEq total) by mouth daily.  Dispense: 90 tablet; Refill: 1  5. Other diabetic neurological complication associated with type 2 diabetes mellitus (HCC) Uncontrolled due to running out of gabapentin  which I have refilled - gabapentin  (NEURONTIN ) 300 MG capsule; Take 2 capsules (600 mg total) by mouth at bedtime.  Dispense: 180 capsule; Refill: 1  6. Hyperlipidemia due to type 2 diabetes mellitus (HCC) Controlled Continue statin  7. Hypertension associated with diabetes (HCC) Controlled Counseled on blood pressure goal of less than 130/80, low-sodium, DASH diet, medication compliance, 150 minutes of moderate intensity exercise per week. Discussed medication compliance, adverse effects. - losartan  (COZAAR ) 25 MG tablet; Take 0.5 tablets (12.5 mg total) by mouth at bedtime.  Dispense: 45 tablet; Refill: 3 - spironolactone  (ALDACTONE ) 25 MG tablet; Take 0.5 tablets (12.5 mg total) by mouth daily.  Dispense: 45 tablet; Refill: 1  8. Smoking greater than 20 pack years Spent 3 minutes  counseling on smoking cessation and adverse effect of smoking but he is not ready to quit - CT CHEST LUNG CANCER SCREENING LOW DOSE WO CONTRAST; Future  9. Screening for prostate cancer - PSA, total and free  10. Screening for colon cancer - Ambulatory referral to Gastroenterology   Meds ordered this encounter  Medications   albuterol  (VENTOLIN  HFA) 108 (90 Base) MCG/ACT inhaler    Sig: Inhale 2 puffs into the lungs every 6 hours as needed for wheezing or shortness of breath.    Dispense:  6.7 g    Refill:  1   atorvastatin  (LIPITOR ) 80 MG tablet    Sig: Take 1 tablet (80 mg total) by mouth at bedtime.    Dispense:  90 tablet    Refill:  1   carvedilol  (COREG ) 6.25 MG tablet    Sig: Take 1 tablet (6.25 mg total) by mouth 2 (two) times daily with a meal.    Dispense:  180 tablet    Refill:  1   Dulaglutide  (TRULICITY ) 3 MG/0.5ML SOAJ    Sig: Inject 3 mg as directed once a week.    Dispense:  2 mL    Refill:  6    Dose increase   furosemide  (LASIX ) 40 MG tablet    Sig: Take 1 tablet (40 mg total) by mouth daily.    Dispense:  90 tablet    Refill:  1   gabapentin  (NEURONTIN ) 300 MG capsule    Sig: Take 2 capsules (600 mg total) by mouth at bedtime.    Dispense:  180 capsule    Refill:  1   insulin  glargine (LANTUS  SOLOSTAR) 100 UNIT/ML Solostar Pen    Sig: Inject 42 Units into the skin daily.    Dispense:  30 mL    Refill:  6   insulin  lispro (HUMALOG  KWIKPEN) 100 UNIT/ML KwikPen    Sig: Inject  5 Units into the skin daily before supper.    Dispense:  9 mL    Refill:  6   losartan  (COZAAR ) 25 MG tablet    Sig: Take 0.5 tablets (12.5 mg total) by mouth at bedtime.    Dispense:  45 tablet    Refill:  3   metFORMIN  (GLUCOPHAGE ) 500 MG tablet    Sig: Take 2 tablets (1,000 mg total) by mouth 2 (two) times daily with a meal.    Dispense:  360 tablet    Refill:  1   potassium chloride  (KLOR-CON ) 10 MEQ tablet    Sig: Take 1 tablet (10 mEq total) by mouth daily.     Dispense:  90 tablet    Refill:  1    Discontinue 20 meq   spironolactone  (ALDACTONE ) 25 MG tablet    Sig: Take 0.5 tablets (12.5 mg total) by mouth daily.    Dispense:  45 tablet    Refill:  1   umeclidinium bromide  (INCRUSE ELLIPTA ) 62.5 MCG/ACT AEPB    Sig: Inhale 1 puff into the lungs daily.    Dispense:  30 each    Refill:  6    Follow-up: Return in about 6 months (around 10/24/2024) for Chronic medical conditions.       Corrina Sabin, MD, FAAFP. [facility] [place], [place] [phone]   04/24/2024, 9:39 AM

## 2024-04-24 NOTE — Patient Instructions (Signed)
 VISIT SUMMARY:  You had a follow-up appointment to review your chronic conditions, including diabetes, COPD, and peripheral arterial disease. Your diabetes management has improved significantly, and you have lost 28 pounds over the past nine months. We discussed your current medications, smoking habits, and the need for additional screenings and tests.  YOUR PLAN:  -TYPE 2 DIABETES MELLITUS: Your diabetes is well-controlled with an A1c of 6.2, thanks to your weight loss and current medication regimen. Continue taking Lantus  42 units daily, Humalog  5 units before supper, Metformin  1000 mg twice daily, and Dulaglutide  1.5 mg weekly. Keep monitoring your blood glucose levels regularly.  -DIABETIC NEUROPATHY: Diabetic neuropathy is nerve damage caused by high blood sugar levels, leading to decreased sensation in your legs. We have refilled your gabapentin  prescription to help manage the pain.  -CHRONIC OBSTRUCTIVE PULMONARY DISEASE (COPD): COPD is a chronic lung disease that makes it hard to breathe. Continue using your Incruse inhaler daily. We have ordered a full pulmonary function test (PFT) and a CT scan for lung cancer screening to assess your lung health.  -TOBACCO USE DISORDER: Smoking can worsen your COPD and peripheral artery disease. We discussed the importance of quitting smoking completely to improve your health.  -PERIPHERAL ARTERY DISEASE: Peripheral artery disease is a circulation problem in your legs. It's important to quit smoking to prevent worsening of this condition. Monitor for any new pain or symptoms in your legs.  -GENERAL HEALTH MAINTENANCE: You need a colonoscopy for colon cancer screening. We will assist you with the financial assistance application for healthcare services.  INSTRUCTIONS:  Schedule a follow-up appointment in six months. Bernarda will call you with your lab results after your blood work is done.

## 2024-04-25 ENCOUNTER — Ambulatory Visit: Payer: Self-pay | Admitting: Family Medicine

## 2024-04-25 LAB — LP+NON-HDL CHOLESTEROL
Cholesterol, Total: 150 mg/dL (ref 100–199)
HDL: 29 mg/dL — ABNORMAL LOW (ref >39)
LDL Chol Calc (NIH): 105 mg/dL — ABNORMAL HIGH (ref 0–99)
Total Non-HDL-Chol (LDL+VLDL): 121 mg/dL (ref 0–129)
Triglycerides: 81 mg/dL (ref 0–149)
VLDL Cholesterol Cal: 16 mg/dL (ref 5–40)

## 2024-04-25 LAB — CMP14+EGFR
ALT: 27 IU/L (ref 0–44)
AST: 27 IU/L (ref 0–40)
Albumin: 4.3 g/dL (ref 3.9–4.9)
Alkaline Phosphatase: 69 IU/L (ref 44–121)
BUN/Creatinine Ratio: 12 (ref 10–24)
BUN: 14 mg/dL (ref 8–27)
Bilirubin Total: 0.6 mg/dL (ref 0.0–1.2)
CO2: 15 mmol/L — ABNORMAL LOW (ref 20–29)
Calcium: 9.5 mg/dL (ref 8.6–10.2)
Chloride: 105 mmol/L (ref 96–106)
Creatinine, Ser: 1.17 mg/dL (ref 0.76–1.27)
Globulin, Total: 3.4 g/dL (ref 1.5–4.5)
Glucose: 96 mg/dL (ref 70–99)
Potassium: 4.5 mmol/L (ref 3.5–5.2)
Sodium: 140 mmol/L (ref 134–144)
Total Protein: 7.7 g/dL (ref 6.0–8.5)
eGFR: 69 mL/min/{1.73_m2} (ref >59)

## 2024-04-25 LAB — PSA, TOTAL AND FREE
PSA, Free Pct: 38 %
PSA, Free: 0.19 ng/mL
Prostate Specific Ag, Serum: 0.5 ng/mL (ref 0.0–4.0)

## 2024-05-07 ENCOUNTER — Other Ambulatory Visit: Payer: Self-pay

## 2024-05-08 ENCOUNTER — Other Ambulatory Visit: Payer: Self-pay

## 2024-05-10 ENCOUNTER — Other Ambulatory Visit: Payer: Self-pay

## 2024-05-17 ENCOUNTER — Ambulatory Visit (HOSPITAL_BASED_OUTPATIENT_CLINIC_OR_DEPARTMENT_OTHER): Payer: Self-pay

## 2024-05-24 ENCOUNTER — Other Ambulatory Visit (HOSPITAL_COMMUNITY): Payer: Self-pay

## 2024-06-29 ENCOUNTER — Inpatient Hospital Stay (HOSPITAL_COMMUNITY)
Admission: EM | Admit: 2024-06-29 | Discharge: 2024-07-13 | DRG: 252 | Disposition: A | Discharge status: Home / Self Care | Payer: MEDICAID | Attending: Vascular Surgery | Admitting: Vascular Surgery

## 2024-06-29 ENCOUNTER — Encounter (HOSPITAL_COMMUNITY)
Admission: EM | Disposition: A | Discharge status: Home / Self Care | Payer: Self-pay | Source: Home / Self Care | Attending: Vascular Surgery

## 2024-06-29 ENCOUNTER — Encounter (HOSPITAL_COMMUNITY): Payer: Self-pay

## 2024-06-29 ENCOUNTER — Emergency Department (HOSPITAL_COMMUNITY): Payer: Self-pay

## 2024-06-29 ENCOUNTER — Emergency Department (HOSPITAL_COMMUNITY): Payer: Self-pay | Admitting: Anesthesiology

## 2024-06-29 ENCOUNTER — Other Ambulatory Visit: Payer: Self-pay

## 2024-06-29 ENCOUNTER — Emergency Department (HOSPITAL_COMMUNITY): Payer: MEDICAID | Admitting: Anesthesiology

## 2024-06-29 DIAGNOSIS — Z79899 Other long term (current) drug therapy: Secondary | ICD-10-CM

## 2024-06-29 DIAGNOSIS — Z23 Encounter for immunization: Secondary | ICD-10-CM

## 2024-06-29 DIAGNOSIS — E785 Hyperlipidemia, unspecified: Secondary | ICD-10-CM | POA: Diagnosis present

## 2024-06-29 DIAGNOSIS — I252 Old myocardial infarction: Secondary | ICD-10-CM

## 2024-06-29 DIAGNOSIS — Z89412 Acquired absence of left great toe: Secondary | ICD-10-CM

## 2024-06-29 DIAGNOSIS — F17210 Nicotine dependence, cigarettes, uncomplicated: Secondary | ICD-10-CM | POA: Diagnosis present

## 2024-06-29 DIAGNOSIS — Z6832 Body mass index (BMI) 32.0-32.9, adult: Secondary | ICD-10-CM

## 2024-06-29 DIAGNOSIS — N179 Acute kidney failure, unspecified: Secondary | ICD-10-CM | POA: Diagnosis not present

## 2024-06-29 DIAGNOSIS — Z833 Family history of diabetes mellitus: Secondary | ICD-10-CM

## 2024-06-29 DIAGNOSIS — T82868A Thrombosis of vascular prosthetic devices, implants and grafts, initial encounter: Principal | ICD-10-CM

## 2024-06-29 DIAGNOSIS — S75092A Other specified injury of femoral artery, left leg, initial encounter: Secondary | ICD-10-CM | POA: Diagnosis present

## 2024-06-29 DIAGNOSIS — M7989 Other specified soft tissue disorders: Secondary | ICD-10-CM | POA: Diagnosis not present

## 2024-06-29 DIAGNOSIS — N183 Chronic kidney disease, stage 3 unspecified: Secondary | ICD-10-CM | POA: Diagnosis not present

## 2024-06-29 DIAGNOSIS — I2510 Atherosclerotic heart disease of native coronary artery without angina pectoris: Secondary | ICD-10-CM | POA: Diagnosis present

## 2024-06-29 DIAGNOSIS — Z794 Long term (current) use of insulin: Secondary | ICD-10-CM

## 2024-06-29 DIAGNOSIS — T82392A Other mechanical complication of femoral arterial graft (bypass), initial encounter: Secondary | ICD-10-CM

## 2024-06-29 DIAGNOSIS — J439 Emphysema, unspecified: Secondary | ICD-10-CM | POA: Diagnosis present

## 2024-06-29 DIAGNOSIS — Z5900 Homelessness unspecified: Secondary | ICD-10-CM

## 2024-06-29 DIAGNOSIS — S81812A Laceration without foreign body, left lower leg, initial encounter: Principal | ICD-10-CM

## 2024-06-29 DIAGNOSIS — I130 Hypertensive heart and chronic kidney disease with heart failure and stage 1 through stage 4 chronic kidney disease, or unspecified chronic kidney disease: Secondary | ICD-10-CM | POA: Diagnosis not present

## 2024-06-29 DIAGNOSIS — K219 Gastro-esophageal reflux disease without esophagitis: Secondary | ICD-10-CM | POA: Diagnosis present

## 2024-06-29 DIAGNOSIS — S8992XA Unspecified injury of left lower leg, initial encounter: Secondary | ICD-10-CM | POA: Diagnosis present

## 2024-06-29 DIAGNOSIS — E1140 Type 2 diabetes mellitus with diabetic neuropathy, unspecified: Secondary | ICD-10-CM | POA: Diagnosis present

## 2024-06-29 DIAGNOSIS — I5032 Chronic diastolic (congestive) heart failure: Secondary | ICD-10-CM | POA: Diagnosis present

## 2024-06-29 DIAGNOSIS — S85182A Other specified injury of posterior tibial artery, left leg, initial encounter: Secondary | ICD-10-CM | POA: Diagnosis present

## 2024-06-29 DIAGNOSIS — I5023 Acute on chronic systolic (congestive) heart failure: Secondary | ICD-10-CM | POA: Diagnosis not present

## 2024-06-29 DIAGNOSIS — Z7984 Long term (current) use of oral hypoglycemic drugs: Secondary | ICD-10-CM

## 2024-06-29 DIAGNOSIS — R451 Restlessness and agitation: Secondary | ICD-10-CM | POA: Diagnosis not present

## 2024-06-29 DIAGNOSIS — Z9889 Other specified postprocedural states: Secondary | ICD-10-CM

## 2024-06-29 DIAGNOSIS — Z7982 Long term (current) use of aspirin: Secondary | ICD-10-CM

## 2024-06-29 DIAGNOSIS — S71112A Laceration without foreign body, left thigh, initial encounter: Secondary | ICD-10-CM

## 2024-06-29 DIAGNOSIS — Z8249 Family history of ischemic heart disease and other diseases of the circulatory system: Secondary | ICD-10-CM

## 2024-06-29 DIAGNOSIS — Z555 Less than a high school diploma: Secondary | ICD-10-CM

## 2024-06-29 DIAGNOSIS — Z885 Allergy status to narcotic agent status: Secondary | ICD-10-CM

## 2024-06-29 DIAGNOSIS — Z723 Lack of physical exercise: Secondary | ICD-10-CM

## 2024-06-29 DIAGNOSIS — I110 Hypertensive heart disease with heart failure: Secondary | ICD-10-CM | POA: Diagnosis present

## 2024-06-29 DIAGNOSIS — Y828 Other medical devices associated with adverse incidents: Secondary | ICD-10-CM | POA: Diagnosis present

## 2024-06-29 DIAGNOSIS — Z951 Presence of aortocoronary bypass graft: Secondary | ICD-10-CM

## 2024-06-29 DIAGNOSIS — E1151 Type 2 diabetes mellitus with diabetic peripheral angiopathy without gangrene: Secondary | ICD-10-CM | POA: Diagnosis present

## 2024-06-29 DIAGNOSIS — Z888 Allergy status to other drugs, medicaments and biological substances status: Secondary | ICD-10-CM

## 2024-06-29 DIAGNOSIS — E66811 Obesity, class 1: Secondary | ICD-10-CM | POA: Diagnosis present

## 2024-06-29 DIAGNOSIS — Z95820 Peripheral vascular angioplasty status with implants and grafts: Secondary | ICD-10-CM

## 2024-06-29 HISTORY — PX: FEMORAL-POPLITEAL BYPASS GRAFT: SHX937

## 2024-06-29 HISTORY — PX: APPLICATION OF WOUND VAC: SHX5189

## 2024-06-29 HISTORY — PX: INCISION AND DRAINAGE OF WOUND: SHX1803

## 2024-06-29 LAB — COMPREHENSIVE METABOLIC PANEL WITH GFR
ALT: 17 U/L (ref 0–44)
AST: 15 U/L (ref 15–41)
Albumin: 2.8 g/dL — ABNORMAL LOW (ref 3.5–5.0)
Alkaline Phosphatase: 52 U/L (ref 38–126)
Anion gap: 14 (ref 5–15)
BUN: 13 mg/dL (ref 8–23)
CO2: 15 mmol/L — ABNORMAL LOW (ref 22–32)
Calcium: 7.1 mg/dL — ABNORMAL LOW (ref 8.9–10.3)
Chloride: 109 mmol/L (ref 98–111)
Creatinine, Ser: 0.84 mg/dL (ref 0.61–1.24)
GFR, Estimated: >60 mL/min (ref >60)
Glucose, Bld: 204 mg/dL — ABNORMAL HIGH (ref 70–99)
Potassium: 3.6 mmol/L (ref 3.5–5.1)
Sodium: 138 mmol/L (ref 135–145)
Total Bilirubin: 0.5 mg/dL (ref 0.0–1.2)
Total Protein: 5.8 g/dL — ABNORMAL LOW (ref 6.5–8.1)

## 2024-06-29 LAB — CBC WITH DIFFERENTIAL/PLATELET
Abs Immature Granulocytes: 0.05 K/uL (ref 0.00–0.07)
Basophils Absolute: 0.1 K/uL (ref 0.0–0.1)
Basophils Relative: 1 %
Eosinophils Absolute: 0.7 K/uL — ABNORMAL HIGH (ref 0.0–0.5)
Eosinophils Relative: 7 %
HCT: 35.1 % — ABNORMAL LOW (ref 39.0–52.0)
Hemoglobin: 11.2 g/dL — ABNORMAL LOW (ref 13.0–17.0)
Immature Granulocytes: 1 %
Lymphocytes Relative: 16 %
Lymphs Abs: 1.7 K/uL (ref 0.7–4.0)
MCH: 28.3 pg (ref 26.0–34.0)
MCHC: 31.9 g/dL (ref 30.0–36.0)
MCV: 88.6 fL (ref 80.0–100.0)
Monocytes Absolute: 0.8 K/uL (ref 0.1–1.0)
Monocytes Relative: 7 %
Neutro Abs: 7.5 K/uL (ref 1.7–7.7)
Neutrophils Relative %: 68 %
Platelets: 289 K/uL (ref 150–400)
RBC: 3.96 MIL/uL — ABNORMAL LOW (ref 4.22–5.81)
RDW: 15.9 % — ABNORMAL HIGH (ref 11.5–15.5)
WBC: 10.8 K/uL — ABNORMAL HIGH (ref 4.0–10.5)
nRBC: 0 % (ref 0.0–0.2)

## 2024-06-29 LAB — TYPE AND SCREEN
ABO/RH(D): O NEG
Antibody Screen: NEGATIVE

## 2024-06-29 LAB — I-STAT CHEM 8, ED
BUN: 13 mg/dL (ref 8–23)
Calcium, Ion: 0.81 mmol/L — CL (ref 1.15–1.40)
Chloride: 115 mmol/L — ABNORMAL HIGH (ref 98–111)
Creatinine, Ser: 0.6 mg/dL — ABNORMAL LOW (ref 0.61–1.24)
Glucose, Bld: 193 mg/dL — ABNORMAL HIGH (ref 70–99)
HCT: 32 % — ABNORMAL LOW (ref 39.0–52.0)
Hemoglobin: 10.9 g/dL — ABNORMAL LOW (ref 13.0–17.0)
Potassium: 3.4 mmol/L — ABNORMAL LOW (ref 3.5–5.1)
Sodium: 143 mmol/L (ref 135–145)
TCO2: 15 mmol/L — ABNORMAL LOW (ref 22–32)

## 2024-06-29 LAB — GLUCOSE, CAPILLARY
Glucose-Capillary: 179 mg/dL — ABNORMAL HIGH (ref 70–99)
Glucose-Capillary: 154 mg/dL — ABNORMAL HIGH (ref 70–99)

## 2024-06-29 MED ORDER — OXYCODONE HCL 5 MG PO TABS
5.0000 mg | ORAL_TABLET | ORAL | Status: DC | PRN
  Administered 2024-06-30 – 2024-07-04 (×11): 10 mg via ORAL
  Administered 2024-07-04: 5 mg via ORAL
  Administered 2024-07-04: 10 mg via ORAL
  Administered 2024-07-05: 5 mg via ORAL
  Administered 2024-07-06 – 2024-07-10 (×9): 10 mg via ORAL
  Filled 2024-06-29 (×5): qty 2
  Filled 2024-06-29: qty 1
  Filled 2024-06-29 (×12): qty 2
  Filled 2024-06-29: qty 1
  Filled 2024-06-29 (×5): qty 2

## 2024-06-29 MED ORDER — ROCURONIUM BROMIDE 10 MG/ML (PF) SYRINGE
PREFILLED_SYRINGE | INTRAVENOUS | Status: AC
  Filled 2024-06-29: qty 10

## 2024-06-29 MED ORDER — LACTATED RINGERS IV SOLN: INTRAVENOUS | Status: DC

## 2024-06-29 MED ORDER — SENNOSIDES-DOCUSATE SODIUM 8.6-50 MG PO TABS: 1.0000 | ORAL_TABLET | Freq: Every evening | ORAL | Status: DC | PRN

## 2024-06-29 MED ORDER — HEPARIN 6000 UNIT IRRIGATION SOLUTION
Status: AC
Start: 2024-06-29 — End: 2024-06-29
  Filled 2024-06-29: qty 500

## 2024-06-29 MED ORDER — TETANUS-DIPHTH-ACELL PERTUSSIS 5-2.5-18.5 LF-MCG/0.5 IM SUSY
0.5000 mL | PREFILLED_SYRINGE | Freq: Once | INTRAMUSCULAR | Status: AC
  Administered 2024-06-29: 0.5 mL via INTRAMUSCULAR

## 2024-06-29 MED ORDER — 0.9 % SODIUM CHLORIDE (POUR BTL) OPTIME
TOPICAL | Status: DC | PRN
  Administered 2024-06-29: 2000 mL

## 2024-06-29 MED ORDER — CARVEDILOL 6.25 MG PO TABS
6.2500 mg | ORAL_TABLET | Freq: Two times a day (BID) | ORAL | Status: DC
  Administered 2024-06-30 – 2024-07-13 (×27): 6.25 mg via ORAL
  Filled 2024-06-29 (×27): qty 1

## 2024-06-29 MED ORDER — PANTOPRAZOLE SODIUM 40 MG PO TBEC
40.0000 mg | DELAYED_RELEASE_TABLET | Freq: Every day | ORAL | Status: DC
  Administered 2024-06-30 – 2024-07-13 (×14): 40 mg via ORAL
  Filled 2024-06-29 (×14): qty 1

## 2024-06-29 MED ORDER — DEXAMETHASONE SODIUM PHOSPHATE 10 MG/ML IJ SOLN
INTRAMUSCULAR | Status: AC
  Filled 2024-06-29: qty 1

## 2024-06-29 MED ORDER — ALUM & MAG HYDROXIDE-SIMETH 200-200-20 MG/5ML PO SUSP: 15.0000 mL | ORAL | Status: DC | PRN

## 2024-06-29 MED ORDER — ACETAMINOPHEN 325 MG PO TABS
325.0000 mg | ORAL_TABLET | ORAL | Status: DC | PRN
  Administered 2024-07-04: 650 mg via ORAL
  Filled 2024-06-29 (×2): qty 2

## 2024-06-29 MED ORDER — METFORMIN HCL 500 MG PO TABS
1000.0000 mg | ORAL_TABLET | Freq: Two times a day (BID) | ORAL | Status: DC
  Administered 2024-07-01 – 2024-07-13 (×25): 1000 mg via ORAL
  Filled 2024-06-29 (×25): qty 2

## 2024-06-29 MED ORDER — CHLORHEXIDINE GLUCONATE 0.12 % MT SOLN
OROMUCOSAL | Status: AC
  Filled 2024-06-29: qty 15

## 2024-06-29 MED ORDER — MIDAZOLAM HCL 5 MG/5ML IJ SOLN
INTRAMUSCULAR | Status: DC | PRN
  Administered 2024-06-29: 2 mg via INTRAVENOUS

## 2024-06-29 MED ORDER — LABETALOL HCL 5 MG/ML IV SOLN: 10.0000 mg | INTRAVENOUS | Status: DC | PRN

## 2024-06-29 MED ORDER — PROPOFOL 10 MG/ML IV BOLUS
INTRAVENOUS | Status: AC
  Filled 2024-06-29: qty 20

## 2024-06-29 MED ORDER — PHENYLEPHRINE HCL (PRESSORS) 10 MG/ML IV SOLN
INTRAVENOUS | Status: DC | PRN
  Administered 2024-06-29: 160 ug via INTRAVENOUS

## 2024-06-29 MED ORDER — SUCCINYLCHOLINE CHLORIDE 200 MG/10ML IV SOSY
PREFILLED_SYRINGE | INTRAVENOUS | Status: AC
  Filled 2024-06-29: qty 10

## 2024-06-29 MED ORDER — CEFAZOLIN SODIUM 1 G IJ SOLR
INTRAMUSCULAR | Status: AC
  Filled 2024-06-29: qty 30

## 2024-06-29 MED ORDER — FENTANYL CITRATE (PF) 100 MCG/2ML IJ SOLN
INTRAMUSCULAR | Status: DC | PRN
  Administered 2024-06-29 (×2): 50 ug via INTRAVENOUS
  Administered 2024-06-29: 25 ug via INTRAVENOUS
  Administered 2024-06-29: 50 ug via INTRAVENOUS

## 2024-06-29 MED ORDER — FENTANYL CITRATE (PF) 250 MCG/5ML IJ SOLN
INTRAMUSCULAR | Status: AC
  Filled 2024-06-29: qty 5

## 2024-06-29 MED ORDER — HYDRALAZINE HCL 20 MG/ML IJ SOLN: 5.0000 mg | INTRAMUSCULAR | Status: DC | PRN

## 2024-06-29 MED ORDER — PROPOFOL 10 MG/ML IV BOLUS
INTRAVENOUS | Status: DC | PRN
  Administered 2024-06-29: 90 mg via INTRAVENOUS

## 2024-06-29 MED ORDER — SUGAMMADEX SODIUM 200 MG/2ML IV SOLN
INTRAVENOUS | Status: DC | PRN
  Administered 2024-06-29: 300 mg via INTRAVENOUS

## 2024-06-29 MED ORDER — CEFAZOLIN SODIUM-DEXTROSE 2-4 GM/100ML-% IV SOLN
2.0000 g | Freq: Three times a day (TID) | INTRAVENOUS | Status: AC
  Administered 2024-06-30 – 2024-07-01 (×6): 2 g via INTRAVENOUS
  Filled 2024-06-29 (×6): qty 100

## 2024-06-29 MED ORDER — ONDANSETRON HCL 4 MG/2ML IJ SOLN: 4.0000 mg | Freq: Four times a day (QID) | INTRAMUSCULAR | Status: DC | PRN

## 2024-06-29 MED ORDER — EPHEDRINE 5 MG/ML INJ
INTRAVENOUS | Status: AC
  Filled 2024-06-29: qty 5

## 2024-06-29 MED ORDER — CEFAZOLIN SODIUM-DEXTROSE 2-3 GM-%(50ML) IV SOLR
INTRAVENOUS | Status: DC | PRN
  Administered 2024-06-29: 2 g via INTRAVENOUS

## 2024-06-29 MED ORDER — LIDOCAINE HCL (CARDIAC) PF 100 MG/5ML IV SOSY
PREFILLED_SYRINGE | INTRAVENOUS | Status: DC | PRN
  Administered 2024-06-29: 100 mg via INTRAVENOUS

## 2024-06-29 MED ORDER — ONDANSETRON HCL 4 MG/2ML IJ SOLN
INTRAMUSCULAR | Status: AC
  Filled 2024-06-29: qty 2

## 2024-06-29 MED ORDER — SPIRONOLACTONE 12.5 MG HALF TABLET
12.5000 mg | ORAL_TABLET | Freq: Every day | ORAL | Status: DC
  Administered 2024-06-30 – 2024-07-04 (×5): 12.5 mg via ORAL
  Filled 2024-06-29 (×5): qty 1

## 2024-06-29 MED ORDER — ATORVASTATIN CALCIUM 80 MG PO TABS
80.0000 mg | ORAL_TABLET | Freq: Every day | ORAL | Status: DC
  Administered 2024-06-30 – 2024-07-12 (×14): 80 mg via ORAL
  Filled 2024-06-29 (×14): qty 1

## 2024-06-29 MED ORDER — ACETAMINOPHEN 650 MG RE SUPP: 325.0000 mg | RECTAL | Status: DC | PRN

## 2024-06-29 MED ORDER — ACETAMINOPHEN 10 MG/ML IV SOLN
INTRAVENOUS | Status: DC | PRN
  Administered 2024-06-29: 1000 mg via INTRAVENOUS

## 2024-06-29 MED ORDER — SODIUM CHLORIDE 0.9% FLUSH: 3.0000 mL | INTRAVENOUS | Status: DC | PRN

## 2024-06-29 MED ORDER — LIDOCAINE 2% (20 MG/ML) 5 ML SYRINGE
INTRAMUSCULAR | Status: AC
  Filled 2024-06-29: qty 5

## 2024-06-29 MED ORDER — ALBUMIN HUMAN 5 % IV SOLN: INTRAVENOUS | Status: DC | PRN

## 2024-06-29 MED ORDER — POTASSIUM CHLORIDE CRYS ER 20 MEQ PO TBCR
20.0000 meq | EXTENDED_RELEASE_TABLET | Freq: Once | ORAL | Status: AC
  Administered 2024-06-30: 20 meq via ORAL
  Filled 2024-06-29: qty 1

## 2024-06-29 MED ORDER — PHENOL 1.4 % MT LIQD: 1.0000 | OROMUCOSAL | Status: DC | PRN

## 2024-06-29 MED ORDER — METOPROLOL TARTRATE 5 MG/5ML IV SOLN: 2.0000 mg | INTRAVENOUS | Status: DC | PRN

## 2024-06-29 MED ORDER — GABAPENTIN 300 MG PO CAPS
600.0000 mg | ORAL_CAPSULE | Freq: Every day | ORAL | Status: DC
  Administered 2024-06-30 – 2024-07-12 (×14): 600 mg via ORAL
  Filled 2024-06-29 (×14): qty 2

## 2024-06-29 MED ORDER — HYDROMORPHONE HCL 1 MG/ML IJ SOLN
0.5000 mg | INTRAMUSCULAR | Status: DC | PRN
  Administered 2024-06-30 – 2024-07-01 (×3): 1 mg via INTRAVENOUS
  Administered 2024-07-01: 0.5 mg via INTRAVENOUS
  Administered 2024-07-03 – 2024-07-12 (×4): 1 mg via INTRAVENOUS
  Filled 2024-06-29 (×9): qty 1

## 2024-06-29 MED ORDER — ONDANSETRON HCL 4 MG/2ML IJ SOLN
INTRAMUSCULAR | Status: DC | PRN
  Administered 2024-06-29: 4 mg via INTRAVENOUS

## 2024-06-29 MED ORDER — SODIUM CHLORIDE 0.9 % IV SOLN
250.0000 mL | INTRAVENOUS | Status: AC | PRN
Start: 2024-06-29 — End: 2024-06-30

## 2024-06-29 MED ORDER — MIDAZOLAM HCL 2 MG/2ML IJ SOLN
INTRAMUSCULAR | Status: AC
  Filled 2024-06-29: qty 2

## 2024-06-29 MED ORDER — VASHE WOUND IRRIGATION OPTIME
TOPICAL | Status: DC | PRN
  Administered 2024-06-29: 34 [oz_av]

## 2024-06-29 MED ORDER — INSULIN ASPART 100 UNIT/ML IJ SOLN
0.0000 [IU] | Freq: Three times a day (TID) | INTRAMUSCULAR | Status: DC
  Administered 2024-06-30: 5 [IU] via SUBCUTANEOUS
  Administered 2024-06-30: 3 [IU] via SUBCUTANEOUS
  Administered 2024-06-30 – 2024-07-01 (×2): 5 [IU] via SUBCUTANEOUS
  Administered 2024-07-01 (×2): 3 [IU] via SUBCUTANEOUS
  Administered 2024-07-02 (×2): 2 [IU] via SUBCUTANEOUS
  Administered 2024-07-02 – 2024-07-03 (×2): 3 [IU] via SUBCUTANEOUS
  Administered 2024-07-03: 2 [IU] via SUBCUTANEOUS
  Administered 2024-07-03 – 2024-07-04 (×2): 3 [IU] via SUBCUTANEOUS
  Administered 2024-07-05 – 2024-07-08 (×4): 2 [IU] via SUBCUTANEOUS
  Administered 2024-07-10: 3 [IU] via SUBCUTANEOUS
  Administered 2024-07-11: 2 [IU] via SUBCUTANEOUS

## 2024-06-29 MED ORDER — INSULIN ASPART 100 UNIT/ML IJ SOLN
INTRAMUSCULAR | Status: AC
  Administered 2024-06-29: 2 [IU] via SUBCUTANEOUS
  Filled 2024-06-29: qty 1

## 2024-06-29 MED ORDER — CHLORHEXIDINE GLUCONATE 0.12 % MT SOLN: 15.0000 mL | Freq: Once | OROMUCOSAL | Status: AC

## 2024-06-29 MED ORDER — HEPARIN SODIUM (PORCINE) 5000 UNIT/ML IJ SOLN
5000.0000 [IU] | Freq: Three times a day (TID) | INTRAMUSCULAR | Status: DC
  Administered 2024-06-30 – 2024-07-13 (×35): 5000 [IU] via SUBCUTANEOUS
  Filled 2024-06-29 (×35): qty 1

## 2024-06-29 MED ORDER — DOCUSATE SODIUM 100 MG PO CAPS
100.0000 mg | ORAL_CAPSULE | Freq: Two times a day (BID) | ORAL | Status: DC
  Administered 2024-06-30 – 2024-07-12 (×25): 100 mg via ORAL
  Filled 2024-06-29 (×28): qty 1

## 2024-06-29 MED ORDER — GUAIFENESIN-DM 100-10 MG/5ML PO SYRP: 15.0000 mL | ORAL_SOLUTION | ORAL | Status: DC | PRN

## 2024-06-29 MED ORDER — PHENYLEPHRINE HCL-NACL 20-0.9 MG/250ML-% IV SOLN
INTRAVENOUS | Status: DC | PRN
  Administered 2024-06-29: 40 ug/min via INTRAVENOUS

## 2024-06-29 MED ORDER — ASPIRIN 81 MG PO TBEC
81.0000 mg | DELAYED_RELEASE_TABLET | Freq: Every day | ORAL | Status: DC
  Administered 2024-06-30 – 2024-07-13 (×14): 81 mg via ORAL
  Filled 2024-06-29 (×14): qty 1

## 2024-06-29 MED ORDER — ROCURONIUM BROMIDE 100 MG/10ML IV SOLN
INTRAVENOUS | Status: DC | PRN
  Administered 2024-06-29: 20 mg via INTRAVENOUS
  Administered 2024-06-29: 60 mg via INTRAVENOUS

## 2024-06-29 MED ORDER — ORAL CARE MOUTH RINSE: 15.0000 mL | Freq: Once | OROMUCOSAL | Status: AC

## 2024-06-29 MED ORDER — BISACODYL 5 MG PO TBEC: 5.0000 mg | DELAYED_RELEASE_TABLET | Freq: Every day | ORAL | Status: DC | PRN

## 2024-06-29 MED ORDER — SODIUM CHLORIDE 0.9% FLUSH
3.0000 mL | Freq: Two times a day (BID) | INTRAVENOUS | Status: DC
  Administered 2024-06-30 – 2024-07-13 (×28): 3 mL via INTRAVENOUS

## 2024-06-29 MED ORDER — CHLORHEXIDINE GLUCONATE 0.12 % MT SOLN
OROMUCOSAL | Status: AC
  Administered 2024-06-29: 15 mL via OROMUCOSAL
  Filled 2024-06-29: qty 15

## 2024-06-29 MED ORDER — INSULIN ASPART 100 UNIT/ML IJ SOLN
0.0000 [IU] | Freq: Every day | INTRAMUSCULAR | Status: DC
  Administered 2024-07-02: 2 [IU] via SUBCUTANEOUS

## 2024-06-29 MED ORDER — INSULIN ASPART 100 UNIT/ML IJ SOLN
0.0000 [IU] | INTRAMUSCULAR | Status: AC | PRN
  Administered 2024-06-29: 2 [IU] via SUBCUTANEOUS
  Filled 2024-06-29: qty 1

## 2024-06-29 MED ORDER — PHENYLEPHRINE 80 MCG/ML (10ML) SYRINGE FOR IV PUSH (FOR BLOOD PRESSURE SUPPORT)
PREFILLED_SYRINGE | INTRAVENOUS | Status: AC
  Filled 2024-06-29: qty 10

## 2024-06-29 MED ORDER — LOSARTAN POTASSIUM 25 MG PO TABS
12.5000 mg | ORAL_TABLET | Freq: Every day | ORAL | Status: DC
  Administered 2024-06-30 – 2024-07-12 (×14): 12.5 mg via ORAL
  Filled 2024-06-29 (×15): qty 0.5

## 2024-06-29 MED ORDER — IOHEXOL 350 MG/ML SOLN
120.0000 mL | Freq: Once | INTRAVENOUS | Status: AC | PRN
  Administered 2024-06-29: 120 mL via INTRAVENOUS

## 2024-06-29 SURGICAL SUPPLY — 1 items: VALVULOTOME HEAD CUTTING LEMTR (VASCULAR PRODUCTS) IMPLANT

## 2024-06-29 NOTE — ED Notes (Signed)
 Patient transported to CT
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
## 2024-06-29 NOTE — ED Notes (Signed)
 EDP at bedside with doppler Left posterior tibial pulse present, left dorsalis pulse absent.

## 2024-06-29 NOTE — Transfer of Care (Signed)
 Immediate Anesthesia Transfer of Care Note  Patient: Robert Sanchez  Procedure(s) Performed: IRRIGATION AND EXCISIONAL DEBRIDEMENT OF TRAUMATIC WOUNDS OF LEFT LOWER EXTREMITY (Left: Leg Lower) PARTIAL EXCISION OF BYPASS GRAFT  LEFT FEMORAL-ANTERIOR TIBIAL ARTERY (Left: Leg Upper) APPLICATION, WOUND VAC TO LEFT LOWER EXTREMITY (Left: Leg Upper)  Patient Location: PACU  Anesthesia Type:General  Level of Consciousness: awake, alert , and oriented  Airway & Oxygen Therapy: Patient Spontanous Breathing and Patient connected to nasal cannula oxygen  Post-op Assessment: Report given to RN, Post -op Vital signs reviewed and stable, and Patient moving all extremities  Post vital signs: Reviewed and stable  Last Vitals:  Vitals Value Taken Time  BP 165/76 06/29/24 21:00  Temp 36.1 C 06/29/24 20:51  Pulse 73 06/29/24 21:05  Resp 16 06/29/24 21:05  SpO2 94 % 06/29/24 21:05  Vitals shown include unfiled device data.  Last Pain:  Vitals:   06/29/24 2051  TempSrc:   PainSc: Asleep         Complications: No notable events documented.

## 2024-06-29 NOTE — Progress Notes (Addendum)
 Received report from [place], [employer] fund[employer]. Patient has been in PACU for 10 minutes when I took over. CBG 124, not transferring from machine for some reason. Called Dr. Keneth for PACU orders, says he will put them in when he can. Rokoshi,CRNA still at bedside and gave 50mcg fentanyl .
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
## 2024-06-29 NOTE — Anesthesia Procedure Notes (Signed)
 Procedure Name: Intubation Date/Time: 06/29/2024 7:38 PM  Performed by: Eulalah Rupert T, CRNAPre-anesthesia Checklist: Patient identified, Emergency Drugs available, Suction available and Patient being monitored Patient Re-evaluated:Patient Re-evaluated prior to induction Oxygen Delivery Method: Circle system utilized Preoxygenation: Pre-oxygenation with 100% oxygen Induction Type: IV induction Ventilation: Mask ventilation without difficulty Laryngoscope Size: Mac and 4 Grade View: Grade II Tube type: Oral Tube size: 7.5 mm Number of attempts: 1 Airway Equipment and Method: Stylet and Oral airway Placement Confirmation: ETT inserted through vocal cords under direct vision, positive ETCO2 and breath sounds checked- equal and bilateral Secured at: 22 cm Tube secured with: Tape Dental Injury: Teeth and Oropharynx as per pre-operative assessment
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
## 2024-06-30 ENCOUNTER — Encounter (HOSPITAL_COMMUNITY): Payer: Self-pay | Admitting: Vascular Surgery

## 2024-06-30 ENCOUNTER — Other Ambulatory Visit (HOSPITAL_COMMUNITY): Payer: Self-pay

## 2024-06-30 DIAGNOSIS — Z48812 Encounter for surgical aftercare following surgery on the circulatory system: Secondary | ICD-10-CM

## 2024-06-30 LAB — CBC
HCT: 41.4 % (ref 39.0–52.0)
Hemoglobin: 13.4 g/dL (ref 13.0–17.0)
MCH: 28.2 pg (ref 26.0–34.0)
MCHC: 32.4 g/dL (ref 30.0–36.0)
MCV: 87.2 fL (ref 80.0–100.0)
Platelets: 311 K/uL (ref 150–400)
RBC: 4.75 MIL/uL (ref 4.22–5.81)
RDW: 16.3 % — ABNORMAL HIGH (ref 11.5–15.5)
WBC: 11.3 K/uL — ABNORMAL HIGH (ref 4.0–10.5)
nRBC: 0 % (ref 0.0–0.2)

## 2024-06-30 LAB — CREATININE, SERUM
Creatinine, Ser: 1.11 mg/dL (ref 0.61–1.24)
GFR, Estimated: >60 mL/min (ref >60)

## 2024-06-30 LAB — GLUCOSE, CAPILLARY
Glucose-Capillary: 124 mg/dL — ABNORMAL HIGH (ref 70–99)
Glucose-Capillary: 220 mg/dL — ABNORMAL HIGH (ref 70–99)
Glucose-Capillary: 196 mg/dL — ABNORMAL HIGH (ref 70–99)
Glucose-Capillary: 226 mg/dL — ABNORMAL HIGH (ref 70–99)
Glucose-Capillary: 165 mg/dL — ABNORMAL HIGH (ref 70–99)

## 2024-06-30 LAB — HEMOGLOBIN A1C
Hgb A1c MFr Bld: 6.7 % — ABNORMAL HIGH (ref 4.8–5.6)
Mean Plasma Glucose: 145.59 mg/dL

## 2024-06-30 MED ORDER — OXYCODONE HCL 5 MG PO TABS
5.0000 mg | ORAL_TABLET | Freq: Four times a day (QID) | ORAL | 0 refills | Status: AC | PRN
  Filled 2024-06-30: qty 30, 8d supply, fill #0

## 2024-06-30 NOTE — Progress Notes (Signed)
 Patient scheduled for discharge today, patient  felt like he was not stable to go home today, states he does not know where he will live, he has a friend but is unsure he will let him stay with him, wound vac last night was not draining properly, changed today and canister is draining 50 ml, discussed with MD, he states patient can stay another night if it will help his social situation.

## 2024-06-30 NOTE — Plan of Care (Signed)
  Problem: Education: Goal: Knowledge of General Education information will improve Description: Including pain rating scale, medication(s)/side effects and non-pharmacologic comfort measures 06/30/2024 0810 by Billee Marybelle Ragena SHAUNNA, RN Outcome: Progressing 06/30/2024 0810 by Billee Marybelle Ragena SHAUNNA, RN Outcome: Progressing   Problem: Health Behavior/Discharge Planning: Goal: Ability to manage health-related needs will improve Outcome: Progressing   Problem: Clinical Measurements: Goal: Ability to maintain clinical measurements within normal limits will improve Outcome: Progressing Goal: Will remain free from infection Outcome: Progressing Goal: Diagnostic test results will improve Outcome: Progressing Goal: Respiratory complications will improve Outcome: Progressing Goal: Cardiovascular complication will be avoided Outcome: Progressing   Problem: Activity: Goal: Risk for activity intolerance will decrease Outcome: Progressing   Problem: Nutrition: Goal: Adequate nutrition will be maintained Outcome: Progressing   Problem: Coping: Goal: Level of anxiety will decrease Outcome: Progressing   Problem: Elimination: Goal: Will not experience complications related to bowel motility Outcome: Progressing Goal: Will not experience complications related to urinary retention Outcome: Progressing   Problem: Pain Managment: Goal: General experience of comfort will improve and/or be controlled Outcome: Progressing   Problem: Safety: Goal: Ability to remain free from injury will improve Outcome: Progressing   Problem: Skin Integrity: Goal: Risk for impaired skin integrity will decrease Outcome: Progressing   Problem: Education: Goal: Ability to describe self-care measures that may prevent or decrease complications (Diabetes Survival Skills Education) will improve Outcome: Progressing Goal: Individualized Educational Video(s) Outcome: Progressing   Problem:  Coping: Goal: Ability to adjust to condition or change in health will improve Outcome: Progressing   Problem: Fluid Volume: Goal: Ability to maintain a balanced intake and output will improve Outcome: Progressing   Problem: Health Behavior/Discharge Planning: Goal: Ability to identify and utilize available resources and services will improve Outcome: Progressing Goal: Ability to manage health-related needs will improve Outcome: Progressing   Problem: Metabolic: Goal: Ability to maintain appropriate glucose levels will improve Outcome: Progressing   Problem: Nutritional: Goal: Maintenance of adequate nutrition will improve Outcome: Progressing Goal: Progress toward achieving an optimal weight will improve Outcome: Progressing   Problem: Skin Integrity: Goal: Risk for impaired skin integrity will decrease Outcome: Progressing   Problem: Tissue Perfusion: Goal: Adequacy of tissue perfusion will improve Outcome: Progressing

## 2024-06-30 NOTE — TOC Transition Note (Signed)
 Transition of Care [facility]) - Discharge Note   Patient Details  Name: Robert Sanchez MRN: [id number] Date of Birth: [birth year]  Transition of Care [facility]) CM/SW Contact:  Robynn Eileen Hoose, RN Phone Number: 06/30/2024, 11:07 AM   Clinical Narrative:   Patient is being discharged. Patient without housing option as of 07/02/2024. Without shelter and access to electrical source, pt unable to get wound vac. Per provider, pt can change to wet to dry dressing.    Final next level of care: Home/Self Care Barriers to Discharge: Barriers Resolved (Patient homeless as of 9/1, unable to obtain wound vac due to pending homeless situation. No electrical source or shelter available.)   Patient Goals and CMS Choice            Discharge Placement                       Discharge Plan and Services Additional resources added to the After Visit Summary for                                       Social Drivers of Health (SDOH) Interventions SDOH Screenings   Food Insecurity: No Food Insecurity (06/30/2024)  Housing: Low Risk  (06/30/2024)  Transportation Needs: No Transportation Needs (06/30/2024)  Utilities: Not At Risk (06/30/2024)  Alcohol Screen: Low Risk  (01/10/2023)  Depression (PHQ2-9): Low Risk  (04/24/2024)  Financial Resource Strain: Low Risk  (01/10/2023)  Physical Activity: Inactive (01/10/2023)  Social Connections: Patient Declined (06/30/2024)  Stress: No Stress Concern Present (01/10/2023)  Recent Concern: Stress - Stress Concern Present (01/10/2023)  Tobacco Use: High Risk (06/30/2024)     Readmission Risk Interventions    06/07/2022    2:19 PM  Readmission Risk Prevention Plan  Transportation Screening Complete  PCP or Specialist Appt within 3-5 Days Complete  HRI or Home Care Consult Complete  Social Work Consult for Recovery Care Planning/Counseling Complete  Palliative Care Screening Not Applicable  Medication Review [employer]) Complete

## 2024-06-30 NOTE — Progress Notes (Addendum)
 Progress Note    06/30/2024 10:18 AM 1 Day Post-Op  Subjective:  no major complaints. He is in process of having to move by 9/1 and does not have a place to discharge to currently.    Vitals:   06/30/24 0422 06/30/24 0917  BP:  122/67  Pulse:  88  Resp:  17  Temp:  98.1 F (36.7 C)  SpO2: 96% 95%   Physical Exam: Cardiac:  regular Lungs:  non labored Incisions:  left lateral leg wounds with VAC to suction Extremities:  left leg remains well perfused and warm  Neurologic: alert and oriented  CBC    Component Value Date/Time   WBC 11.3 (H) 06/30/2024 0721   RBC 4.75 06/30/2024 0721   HGB 13.4 06/30/2024 0721   HCT 41.4 06/30/2024 0721   PLT 311 06/30/2024 0721   MCV 87.2 06/30/2024 0721   MCH 28.2 06/30/2024 0721   MCHC 32.4 06/30/2024 0721   RDW 16.3 (H) 06/30/2024 0721   LYMPHSABS 1.7 06/29/2024 1215   MONOABS 0.8 06/29/2024 1215   EOSABS 0.7 (H) 06/29/2024 1215   BASOSABS 0.1 06/29/2024 1215    BMET    Component Value Date/Time   NA 143 06/29/2024 1244   NA 140 04/24/2024 0928   K 3.4 (L) 06/29/2024 1244   CL 115 (H) 06/29/2024 1244   CO2 15 (L) 06/29/2024 1215   GLUCOSE 193 (H) 06/29/2024 1244   BUN 13 06/29/2024 1244   BUN 14 04/24/2024 0928   CREATININE 1.11 06/30/2024 0721   CREATININE 0.81 08/14/2014 1126   CALCIUM  7.1 (L) 06/29/2024 1215   GFRNONAA >60 06/30/2024 0721   GFRNONAA >89 08/14/2014 1126   GFRAA 58 (L) 08/05/2020 0524   GFRAA >89 08/14/2014 1126    INR    Component Value Date/Time   INR 1.3 (H) 06/05/2022 1259     Intake/Output Summary (Last 24 hours) at 06/30/2024 1018 Last data filed at 06/30/2024 0620 Gross per 24 hour  Intake 1050 ml  Output 500 ml  Net 550 ml     Assessment/Plan:  68 y.o. male is s/p 1) partial excision of left common femoral - anterior tibial artery bypass (CPT [phone]) 2) sharp excisional debridement of left lower extremity (CPT 11043) 3) initiation of wound vac therapy to wounds of left lower  extremity (CPT [phone])   1 Day Post-Op   Left leg remains well perfused and warm Left lateral leg wounds with VAC to suction Patient has a tricky disposition as he has to be out of his current house by 9/1 and does not have anywhere to go at this time If we can get approval for a home VAC we will discharge today with VAC if not will transition to wet to dry dressings Will arrange close outpatient follow up next week in our office for wound check   Teretha Damme, PA-C [facility] [phone] 06/30/2024 10:18 AM  VASCULAR STAFF ADDENDUM: I have independently interviewed and examined the patient. I agree with the above.  Socially disadvantaged situation.  Needs local wound care to the legs, which he will likely have to do himself until his living situation settles out.  Will make appointment for him next week to be evaluated in the clinic.  He needs to leave this weekend.  Will ask bedside nurse to instruct him in wet-to-dry dressing changes.  He can follow-up with me on Tuesday.  Debby SAILOR. Magda, MD FACS [facility] Phone Number: [phone] 06/30/2024 11:31 AM

## 2024-07-01 DIAGNOSIS — S75092A Other specified injury of femoral artery, left leg, initial encounter: Secondary | ICD-10-CM | POA: Diagnosis present

## 2024-07-01 DIAGNOSIS — E66811 Obesity, class 1: Secondary | ICD-10-CM | POA: Diagnosis present

## 2024-07-01 DIAGNOSIS — Z8249 Family history of ischemic heart disease and other diseases of the circulatory system: Secondary | ICD-10-CM | POA: Diagnosis not present

## 2024-07-01 DIAGNOSIS — Z833 Family history of diabetes mellitus: Secondary | ICD-10-CM | POA: Diagnosis not present

## 2024-07-01 DIAGNOSIS — S81812A Laceration without foreign body, left lower leg, initial encounter: Secondary | ICD-10-CM | POA: Diagnosis present

## 2024-07-01 DIAGNOSIS — I2510 Atherosclerotic heart disease of native coronary artery without angina pectoris: Secondary | ICD-10-CM | POA: Diagnosis present

## 2024-07-01 DIAGNOSIS — I5032 Chronic diastolic (congestive) heart failure: Secondary | ICD-10-CM | POA: Diagnosis present

## 2024-07-01 DIAGNOSIS — Z794 Long term (current) use of insulin: Secondary | ICD-10-CM | POA: Diagnosis not present

## 2024-07-01 DIAGNOSIS — Z23 Encounter for immunization: Secondary | ICD-10-CM | POA: Diagnosis present

## 2024-07-01 DIAGNOSIS — S71112A Laceration without foreign body, left thigh, initial encounter: Secondary | ICD-10-CM | POA: Diagnosis present

## 2024-07-01 DIAGNOSIS — E785 Hyperlipidemia, unspecified: Secondary | ICD-10-CM | POA: Diagnosis present

## 2024-07-01 DIAGNOSIS — I110 Hypertensive heart disease with heart failure: Secondary | ICD-10-CM | POA: Diagnosis present

## 2024-07-01 DIAGNOSIS — F17210 Nicotine dependence, cigarettes, uncomplicated: Secondary | ICD-10-CM | POA: Diagnosis present

## 2024-07-01 DIAGNOSIS — I252 Old myocardial infarction: Secondary | ICD-10-CM | POA: Diagnosis not present

## 2024-07-01 DIAGNOSIS — Z5900 Homelessness unspecified: Secondary | ICD-10-CM | POA: Diagnosis not present

## 2024-07-01 DIAGNOSIS — N179 Acute kidney failure, unspecified: Secondary | ICD-10-CM | POA: Diagnosis not present

## 2024-07-01 DIAGNOSIS — Z951 Presence of aortocoronary bypass graft: Secondary | ICD-10-CM | POA: Diagnosis not present

## 2024-07-01 DIAGNOSIS — S85182A Other specified injury of posterior tibial artery, left leg, initial encounter: Secondary | ICD-10-CM | POA: Diagnosis present

## 2024-07-01 DIAGNOSIS — M7989 Other specified soft tissue disorders: Secondary | ICD-10-CM | POA: Diagnosis not present

## 2024-07-01 DIAGNOSIS — E1140 Type 2 diabetes mellitus with diabetic neuropathy, unspecified: Secondary | ICD-10-CM | POA: Diagnosis present

## 2024-07-01 DIAGNOSIS — Z95820 Peripheral vascular angioplasty status with implants and grafts: Secondary | ICD-10-CM | POA: Diagnosis not present

## 2024-07-01 DIAGNOSIS — Z7984 Long term (current) use of oral hypoglycemic drugs: Secondary | ICD-10-CM | POA: Diagnosis not present

## 2024-07-01 DIAGNOSIS — J439 Emphysema, unspecified: Secondary | ICD-10-CM | POA: Diagnosis present

## 2024-07-01 DIAGNOSIS — Y828 Other medical devices associated with adverse incidents: Secondary | ICD-10-CM | POA: Diagnosis present

## 2024-07-01 DIAGNOSIS — T82868A Thrombosis of vascular prosthetic devices, implants and grafts, initial encounter: Secondary | ICD-10-CM | POA: Diagnosis present

## 2024-07-01 DIAGNOSIS — E1151 Type 2 diabetes mellitus with diabetic peripheral angiopathy without gangrene: Secondary | ICD-10-CM | POA: Diagnosis present

## 2024-07-01 LAB — GLUCOSE, CAPILLARY
Glucose-Capillary: 243 mg/dL — ABNORMAL HIGH (ref 70–99)
Glucose-Capillary: 191 mg/dL — ABNORMAL HIGH (ref 70–99)
Glucose-Capillary: 186 mg/dL — ABNORMAL HIGH (ref 70–99)
Glucose-Capillary: 172 mg/dL — ABNORMAL HIGH (ref 70–99)

## 2024-07-01 NOTE — Progress Notes (Addendum)
  Progress Note    07/01/2024 10:14 AM 2 Days Post-Op  Subjective:  left leg painful, more challenging then he anticipated with ambulation   Vitals:   07/01/24 0422 07/01/24 0759  BP: 117/60 110/70  Pulse: 77 82  Resp: 17   Temp: 98.8 F (37.1 C) 98 F (36.7 C)  SpO2: 93% 93%   Physical Exam: Cardiac:  regular Lungs:  non labored Incisions:  left lateral leg wounds with VAC to suction Extremities:  LLE remains well perfused and warm  Neurologic: alert and oriented  CBC    Component Value Date/Time   WBC 11.3 (H) 06/30/2024 0721   RBC 4.75 06/30/2024 0721   HGB 13.4 06/30/2024 0721   HCT 41.4 06/30/2024 0721   PLT 311 06/30/2024 0721   MCV 87.2 06/30/2024 0721   MCH 28.2 06/30/2024 0721   MCHC 32.4 06/30/2024 0721   RDW 16.3 (H) 06/30/2024 0721   LYMPHSABS 1.7 06/29/2024 1215   MONOABS 0.8 06/29/2024 1215   EOSABS 0.7 (H) 06/29/2024 1215   BASOSABS 0.1 06/29/2024 1215    BMET    Component Value Date/Time   NA 143 06/29/2024 1244   NA 140 04/24/2024 0928   K 3.4 (L) 06/29/2024 1244   CL 115 (H) 06/29/2024 1244   CO2 15 (L) 06/29/2024 1215   GLUCOSE 193 (H) 06/29/2024 1244   BUN 13 06/29/2024 1244   BUN 14 04/24/2024 0928   CREATININE 1.11 06/30/2024 0721   CREATININE 0.81 08/14/2014 1126   CALCIUM  7.1 (L) 06/29/2024 1215   GFRNONAA >60 06/30/2024 0721   GFRNONAA >89 08/14/2014 1126   GFRAA 58 (L) 08/05/2020 0524   GFRAA >89 08/14/2014 1126    INR    Component Value Date/Time   INR 1.3 (H) 06/05/2022 1259     Intake/Output Summary (Last 24 hours) at 07/01/2024 1014 Last data filed at 07/01/2024 9173 Gross per 24 hour  Intake 940 ml  Output 2000 ml  Net -1060 ml     Assessment/Plan:  68 y.o. male is s/p 1) partial excision of left common femoral - anterior tibial artery bypass (CPT [phone]) 2) sharp excisional debridement of left lower extremity (CPT 11043) 3) initiation of wound vac therapy to wounds of left lower extremity (CPT 97605) 2  Days Post-Op   LLE remains well perfused VAC to suction on lateral leg wounds Continue Pain control as needed Currently patient has challenging social situation and does not have place to d/c to Unable to get approval for home VAC due to insurance, etc Will keep him in hospital to maintain VAC on left leg and hopefully get SW to assist with dispo planning   Teretha Damme, PA-C [facility] [phone] 07/01/2024 10:14 AM  VASCULAR STAFF ADDENDUM: I have independently interviewed and examined the patient. I agree with the above.  Social situation makes discharge impossible over the weekend. Will keep him in house until Tuesday when social work can help him with housing assistance and home health therapies.  Debby SAILOR. Magda, MD [facility] Phone Number: [phone] 07/01/2024 1:11 PM

## 2024-07-02 LAB — GLUCOSE, CAPILLARY
Glucose-Capillary: 169 mg/dL — ABNORMAL HIGH (ref 70–99)
Glucose-Capillary: 194 mg/dL — ABNORMAL HIGH (ref 70–99)
Glucose-Capillary: 121 mg/dL — ABNORMAL HIGH (ref 70–99)
Glucose-Capillary: 225 mg/dL — ABNORMAL HIGH (ref 70–99)

## 2024-07-02 NOTE — Progress Notes (Signed)
 Transition of Care [facility]) - CAGE-AID Screening   Patient Details  Name: Robert Sanchez MRN: [id number] Date of Birth: [birth year]  Transition of Care [facility]) CM/SW Contact:    Sallyanne MALVA Mettle, RN Phone Number: 07/02/2024, 8:36 PM   Clinical Narrative: Denies drug and alcohol use, no resources indicated.   CAGE-AID Screening:    Have You Ever Felt You Ought to Cut Down on Your Drinking or Drug Use?: No Have People Annoyed You By Critizing Your Drinking Or Drug Use?: No Have You Felt Bad Or Guilty About Your Drinking Or Drug Use?: No Have You Ever Had a Drink or Used Drugs First Thing In The Morning to Steady Your Nerves or to Get Rid of a Hangover?: No CAGE-AID Score: 0  Substance Abuse Education Offered: No

## 2024-07-02 NOTE — Progress Notes (Addendum)
  Progress Note    07/02/2024 10:28 AM 3 Days Post-Op  Subjective: left leg is sore   Vitals:   07/02/24 0444 07/02/24 0811  BP: 121/61 (!) 114/59  Pulse: 75 84  Resp: 16 18  Temp: 97.8 F (36.6 C) 98.1 F (36.7 C)  SpO2: 92% 93%   Physical Exam: Cardiac:  regular Lungs:  non labored Incisions:  left lateral leg wounds with VAC to suction Extremities:  LLE well perfused and warm  Neurologic: alert and oriented  CBC    Component Value Date/Time   WBC 11.3 (H) 06/30/2024 0721   RBC 4.75 06/30/2024 0721   HGB 13.4 06/30/2024 0721   HCT 41.4 06/30/2024 0721   PLT 311 06/30/2024 0721   MCV 87.2 06/30/2024 0721   MCH 28.2 06/30/2024 0721   MCHC 32.4 06/30/2024 0721   RDW 16.3 (H) 06/30/2024 0721   LYMPHSABS 1.7 06/29/2024 1215   MONOABS 0.8 06/29/2024 1215   EOSABS 0.7 (H) 06/29/2024 1215   BASOSABS 0.1 06/29/2024 1215    BMET    Component Value Date/Time   NA 143 06/29/2024 1244   NA 140 04/24/2024 0928   K 3.4 (L) 06/29/2024 1244   CL 115 (H) 06/29/2024 1244   CO2 15 (L) 06/29/2024 1215   GLUCOSE 193 (H) 06/29/2024 1244   BUN 13 06/29/2024 1244   BUN 14 04/24/2024 0928   CREATININE 1.11 06/30/2024 0721   CREATININE 0.81 08/14/2014 1126   CALCIUM  7.1 (L) 06/29/2024 1215   GFRNONAA >60 06/30/2024 0721   GFRNONAA >89 08/14/2014 1126   GFRAA 58 (L) 08/05/2020 0524   GFRAA >89 08/14/2014 1126    INR    Component Value Date/Time   INR 1.3 (H) 06/05/2022 1259     Intake/Output Summary (Last 24 hours) at 07/02/2024 1028 Last data filed at 07/02/2024 0950 Gross per 24 hour  Intake 720 ml  Output 800 ml  Net -80 ml     Assessment/Plan:  68 y.o. male is s/p 1) partial excision of left common femoral - anterior tibial artery bypass (CPT [phone]) 2) sharp excisional debridement of left lower extremity (CPT 11043) 3) initiation of wound vac therapy to wounds of left lower extremity (CPT 97605)  3 Days Post-Op   LLE remains well perfused and warm VAC to  suction on lateral left leg wounds Plan to change vac tomorrow  Mobilize as tolerated Hopefully SW can see patient tomorrow and assist with dispo planning   Teretha Charlyne RIGGERS [facility] [phone] 07/02/2024 10:28 AM  VASCULAR STAFF ADDENDUM: I have independently interviewed and examined the patient. I agree with the above.   Debby SAILOR. Magda, MD [facility] Phone Number: [phone] 07/02/2024 11:24 AM

## 2024-07-02 NOTE — Progress Notes (Signed)
 CSW met with pt regarding homelessness.  Pt confirms that he does not have anywhere to go.  Reports he lived in his current house 14 years but the owner is going to see it.  Pt also lost his job 5 months ago and is behind on rent anyway.  Pt reports no family except one sister in [place], who cannot assist.  Pt has one friend in [place] who said he could sleep in his shed, but no electricity or heat.  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  Discussed options: [employer] and coordinated shelter entry in [place].  Pt open to [facility].  His phone is currently shut off but he thinks he can get it turned back on by tomorrow. Cathlyn Ferry, MSW, LCSW 9/1/202512:28 PM

## 2024-07-02 NOTE — Plan of Care (Signed)

## 2024-07-03 ENCOUNTER — Encounter (HOSPITAL_COMMUNITY): Payer: Self-pay | Admitting: Vascular Surgery

## 2024-07-03 DIAGNOSIS — Z48812 Encounter for surgical aftercare following surgery on the circulatory system: Secondary | ICD-10-CM

## 2024-07-03 LAB — GLUCOSE, CAPILLARY
Glucose-Capillary: 151 mg/dL — ABNORMAL HIGH (ref 70–99)
Glucose-Capillary: 159 mg/dL — ABNORMAL HIGH (ref 70–99)
Glucose-Capillary: 139 mg/dL — ABNORMAL HIGH (ref 70–99)
Glucose-Capillary: 115 mg/dL — ABNORMAL HIGH (ref 70–99)

## 2024-07-03 MED ORDER — INSULIN GLARGINE 100 UNIT/ML ~~LOC~~ SOLN
15.0000 [IU] | Freq: Every day | SUBCUTANEOUS | Status: DC
  Administered 2024-07-03 – 2024-07-12 (×10): 15 [IU] via SUBCUTANEOUS
  Filled 2024-07-03 (×11): qty 0.15

## 2024-07-03 NOTE — Consult Note (Signed)
 WOC Nurse Consult Note: Reason for Consult: replace NPWT dressing LLE Wound type: surgical x 2 Pressure Injury POA: NA Measurement: proximal wound 5cm x 6cm x 1.0cm Distal wound 8.5cm x 8.0cm x 1.0cm  Wound bed: Proximal with exposed vessel and muscle Distal with exposed vessels and muscle Drainage (amount, consistency, odor) serosanguinous in canister Periwound: intact Dressing procedure/placement/frequency: Removed saline moist dressings from each wound that had been placed this am after NPWT dressings were removed by VVS staff Protected skin between the two wounds with strip of VAC drape. 1pc of black foam used as a bridge between wounds 1pc of black foam used for each of the open wounds (2) total. Sealed @ .  Patient required PO pain meds. Very painful with removal, will plan to add Mepitel next dressing change.   WOC will follow along for Q Tuesday/Friday NPWT dressing changes  Robert Sanchez [facility], CNS, CWON-AP [phone]

## 2024-07-03 NOTE — Evaluation (Signed)
 Occupational Therapy Evaluation Patient Details Name: Robert Sanchez MRN: [id number] DOB: Apr 25, 1956 Today's Date: 07/03/2024   History of Present Illness   Robert Sanchez  68 y.o. male who presented with a LLE wound from a boat propeller. 8/29 washout of wounds and partial excision of prosthetic bypass. PMH includes tobacco use, COPD, DM, neuropathy, CAD, HLD, MI, PVD, arthritis, HOH.     Clinical Impressions Robert Sanchez was evaluated s/p the above admission list. He is indep at baseline. Upon evaluation the pt was limited by LLE pain, wound vac and limited ROM, decreased activity tolerance and impaired balance. Overall he was mod I for bed mobility and needed up to superivsion A for transfers and mobility with a RW. Due to the deficits listed below the pt also needs up to min A for LB ADLs and is mod I for UB ADLs. Pt will benefit from continued acute OT services to discharge without need for follow up OT.       If plan is discharge home, recommend the following:   A little help with walking and/or transfers;A little help with bathing/dressing/bathroom;Assistance with cooking/housework;Help with stairs or ramp for entrance;Assist for transportation     Functional Status Assessment   Patient has had a recent decline in their functional status and demonstrates the ability to make significant improvements in function in a reasonable and predictable amount of time.     Equipment Recommendations   Other (comment) (RW)      Precautions/Restrictions   Precautions Precautions: Fall Recall of Precautions/Restrictions: Intact Precaution/Restrictions Comments: wound vac Restrictions Weight Bearing Restrictions Per Provider Order: Yes LLE Weight Bearing Per Provider Order: Weight bearing as tolerated     Mobility Bed Mobility Overal bed mobility: Needs Assistance Bed Mobility: Supine to Sit, Sit to Supine     Supine to sit: Modified independent (Device/Increase  time) Sit to supine: Modified independent (Device/Increase time)        Transfers Overall transfer level: Needs assistance Equipment used: Rolling walker (2 wheels) Transfers: Sit to/from Stand Sit to Stand: Supervision                  Balance Overall balance assessment: Needs assistance Sitting-balance support: Feet supported Sitting balance-Leahy Scale: Good     Standing balance support: No upper extremity supported, During functional activity Standing balance-Leahy Scale: Fair               ADL either performed or assessed with clinical judgement   ADL Overall ADL's : Needs assistance/impaired Eating/Feeding: Independent   Grooming: Supervision/safety;Standing   Upper Body Bathing: Modified independent   Lower Body Bathing: Minimal assistance;Sit to/from stand   Upper Body Dressing : Modified independent   Lower Body Dressing: Minimal assistance;Sit to/from stand   Toilet Transfer: Supervision/safety;Ambulation;Rolling walker (2 wheels);Regular Toilet   Toileting- Clothing Manipulation and Hygiene: Modified independent       Functional mobility during ADLs: Supervision/safety;Rolling walker (2 wheels) General ADL Comments: assist for management of LLE and wound vac     Vision Baseline Vision/History: 0 No visual deficits Vision Assessment?: No apparent visual deficits     Perception Perception: Not tested       Praxis Praxis: Not tested       Pertinent Vitals/Pain Pain Assessment Pain Assessment: Faces Faces Pain Scale: Hurts a little bit Pain Location: LLE Pain Descriptors / Indicators: Discomfort Pain Intervention(s): Limited activity within patient's tolerance, Monitored during session     Extremity/Trunk Assessment Upper Extremity Assessment Upper Extremity Assessment:  Overall [facility] for tasks assessed   Lower Extremity Assessment Lower Extremity Assessment: Defer to PT evaluation   Cervical / Trunk Assessment Cervical /  Trunk Assessment: Normal   Communication Communication Communication: No apparent difficulties   Cognition Arousal: Alert Behavior During Therapy: WFL for tasks assessed/performed Cognition: No apparent impairments           Following commands: Intact       Cueing  General Comments      VSS on RA           Home Living Family/patient expects to be discharged to:: Shelter/Homeless Living Arrangements: Alone             Additional Comments: Lost housing 9/1, was planning to go to the street 9/1. Pt states he was wearing O2 sometimes at night      Prior Functioning/Environment Prior Level of Function : Independent/Modified Independent             Mobility Comments: indep      OT Problem List: Decreased strength;Decreased range of motion;Decreased activity tolerance;Impaired balance (sitting and/or standing);Decreased safety awareness;Decreased knowledge of precautions;Decreased knowledge of use of DME or AE   OT Treatment/Interventions: Self-care/ADL training;Therapeutic exercise;Therapeutic activities;Patient/family education;Balance training      OT Goals(Current goals can be found in the care plan section)   Acute Rehab OT Goals Patient Stated Goal: to get better OT Goal Formulation: With patient Time For Goal Achievement: 07/17/24 Potential to Achieve Goals: Good ADL Goals Pt Will Perform Lower Body Bathing: with modified independence Pt Will Perform Lower Body Dressing: with modified independence;sit to/from stand Pt Will Transfer to Toilet: with modified independence   OT Frequency:  Min 2X/week       AM-PAC OT 6 Clicks Daily Activity     Outcome Measure Help from another person eating meals?: None Help from another person taking care of personal grooming?: A Little Help from another person toileting, which includes using toliet, bedpan, or urinal?: A Little Help from another person bathing (including washing, rinsing, drying)?: A  Little Help from another person to put on and taking off regular upper body clothing?: A Little Help from another person to put on and taking off regular lower body clothing?: A Little 6 Click Score: 19   End of Session Equipment Utilized During Treatment: Rolling walker (2 wheels) Nurse Communication: Mobility status  Activity Tolerance: Patient tolerated treatment well Patient left: in bed;with bed alarm set;with call bell/phone within reach  OT Visit Diagnosis: Unsteadiness on feet (R26.81);Other abnormalities of gait and mobility (R26.89);Muscle weakness (generalized) (M62.81);Pain                Time: 8849-8785 OT Time Calculation (min): 24 min Charges:  OT General Charges $OT Visit: 1 Visit OT Evaluation $OT Eval Moderate Complexity: 1 Mod OT Treatments $Therapeutic Activity: 8-22 mins  Robert Sanchez, OTR/L Acute Rehabilitation Services Office [phone] Secure Chat Communication Preferred   Robert Sanchez 07/03/2024, 1:24 PM

## 2024-07-03 NOTE — Progress Notes (Signed)
 Patient seated at the edge of the bed being restless and sweating,he kept on taking off his oxygen and said he is also experiencing shortness of breath.Vitals taken were as follows:temp 99.9,Bp 114/57,map 71,P-117,R-20 and spO2 91.Charge nurse informed who came and we helped patient back to bed,raised the head of the bed and put him back on 2L.patient felt better afterwards.

## 2024-07-03 NOTE — Progress Notes (Addendum)
  Progress Note    07/03/2024 8:38 AM 4 Days Post-Op  Subjective:  pain around surgical sites   Vitals:   07/03/24 0454 07/03/24 0757  BP: 126/65 (!) 118/52  Pulse: 76 78  Resp: 18 17  Temp: 97.7 F (36.5 C) 98 F (36.7 C)  SpO2: 98% 93%   Physical Exam: Lungs:  non labored Incisions:  L lateral thigh and lower leg without purulence; minimal granulation tissue Extremities: feet symmetricall warm Neurologic: A&O  CBC    Component Value Date/Time   WBC 11.3 (H) 06/30/2024 0721   RBC 4.75 06/30/2024 0721   HGB 13.4 06/30/2024 0721   HCT 41.4 06/30/2024 0721   PLT 311 06/30/2024 0721   MCV 87.2 06/30/2024 0721   MCH 28.2 06/30/2024 0721   MCHC 32.4 06/30/2024 0721   RDW 16.3 (H) 06/30/2024 0721   LYMPHSABS 1.7 06/29/2024 1215   MONOABS 0.8 06/29/2024 1215   EOSABS 0.7 (H) 06/29/2024 1215   BASOSABS 0.1 06/29/2024 1215    BMET    Component Value Date/Time   NA 143 06/29/2024 1244   NA 140 04/24/2024 0928   K 3.4 (L) 06/29/2024 1244   CL 115 (H) 06/29/2024 1244   CO2 15 (L) 06/29/2024 1215   GLUCOSE 193 (H) 06/29/2024 1244   BUN 13 06/29/2024 1244   BUN 14 04/24/2024 0928   CREATININE 1.11 06/30/2024 0721   CREATININE 0.81 08/14/2014 1126   CALCIUM  7.1 (L) 06/29/2024 1215   GFRNONAA >60 06/30/2024 0721   GFRNONAA >89 08/14/2014 1126   GFRAA 58 (L) 08/05/2020 0524   GFRAA >89 08/14/2014 1126    INR    Component Value Date/Time   INR 1.3 (H) 06/05/2022 1259     Intake/Output Summary (Last 24 hours) at 07/03/2024 9161 Last data filed at 07/03/2024 0600 Gross per 24 hour  Intake 720 ml  Output 800 ml  Net -80 ml     Assessment/Plan:  68 y.o. male is s/p  1) partial excision of left common femoral - anterior tibial artery bypass (CPT [phone]) 2) sharp excisional debridement of left lower extremity (CPT 11043) 3) initiation of wound vac therapy to wounds of left lower extremity (CPT 97605) 4 Days Post-Op   LLE remains well perfused VAC removed from  wounds; no purulence noted; WOC RN consulted to replace wound vacs OOB with therapy teams SW consulted for home vac and dispo planning   Donnice Sender, PA-C [facility] [phone] 07/03/2024 8:38 AM  VASCULAR STAFF ADDENDUM: I have independently interviewed and examined the patient. I agree with the above.  Difficult disposition situation. Appreciate social work help. Patient is medically stable for discharge as soon as safe discharge plan can be arranged.   Debby SAILOR. Magda, MD [facility] of [facility] Phone Number: [phone] 07/03/2024 1:03 PM

## 2024-07-04 LAB — BASIC METABOLIC PANEL WITH GFR
Anion gap: 11 (ref 5–15)
BUN: 44 mg/dL — ABNORMAL HIGH (ref 8–23)
CO2: 23 mmol/L (ref 22–32)
Calcium: 8.7 mg/dL — ABNORMAL LOW (ref 8.9–10.3)
Chloride: 99 mmol/L (ref 98–111)
Creatinine, Ser: 1.76 mg/dL — ABNORMAL HIGH (ref 0.61–1.24)
GFR, Estimated: 42 mL/min — ABNORMAL LOW (ref >60)
Glucose, Bld: 115 mg/dL — ABNORMAL HIGH (ref 70–99)
Potassium: 5.7 mmol/L — ABNORMAL HIGH (ref 3.5–5.1)
Sodium: 133 mmol/L — ABNORMAL LOW (ref 135–145)

## 2024-07-04 LAB — GLUCOSE, CAPILLARY
Glucose-Capillary: 103 mg/dL — ABNORMAL HIGH (ref 70–99)
Glucose-Capillary: 188 mg/dL — ABNORMAL HIGH (ref 70–99)
Glucose-Capillary: 107 mg/dL — ABNORMAL HIGH (ref 70–99)
Glucose-Capillary: 146 mg/dL — ABNORMAL HIGH (ref 70–99)

## 2024-07-04 MED ORDER — SODIUM CHLORIDE 0.9 % IV SOLN: INTRAVENOUS | Status: DC

## 2024-07-04 MED ORDER — HALOPERIDOL LACTATE 5 MG/ML IJ SOLN
2.0000 mg | Freq: Four times a day (QID) | INTRAMUSCULAR | Status: DC | PRN
  Administered 2024-07-04: 2 mg via INTRAVENOUS
  Filled 2024-07-04: qty 1

## 2024-07-04 NOTE — Evaluation (Signed)
 Physical Therapy Evaluation Patient Details Name: Robert Sanchez MRN: [id number] DOB: 05/18/1956 Today's Date: 07/04/2024  History of Present Illness  Robert Sanchez  68 y.o. male who presented with a LLE wound from a boat propeller. 8/29 washout of wounds and partial excision of prosthetic bypass. PMH includes tobacco use, COPD, DM, neuropathy, CAD, HLD, MI, PVD, arthritis, HOH.   Clinical Impression  Pt admitted with above diagnosis. PTA, pt was independent with functional mobility, ADLs, and IADLs. He reports intermittently using SPC and/or RW as needed. Pt is unhoused. It is unclear if he is in possession of DME. Pt currently with functional limitations due to the deficits listed below (see PT Problem List). He performed sit<>stand using RW with supervision and required CGA for gait using RW. Pt demonstrated cognitive impairments, unsure if this is baseline. Moderate cues throughout functional mobility for improved safety awareness. Pt will benefit from acute skilled PT to increase his independence and safety with mobility to allow discharge. Recommend continued inpatient follow up therapy, <3 hours/day.    If plan is discharge home, recommend the following: A little help with walking and/or transfers;A little help with bathing/dressing/bathroom;Assistance with cooking/housework;Assist for transportation;Help with stairs or ramp for entrance   Can travel by private vehicle        Equipment Recommendations Rolling walker (2 wheels)  Recommendations for Other Services       Functional Status Assessment Patient has had a recent decline in their functional status and demonstrates the ability to make significant improvements in function in a reasonable and predictable amount of time.     Precautions / Restrictions Precautions Precautions: Fall Recall of Precautions/Restrictions: Intact Precaution/Restrictions Comments: Wound Vac Restrictions Weight Bearing Restrictions Per  Provider Order: Yes LLE Weight Bearing Per Provider Order: Weight bearing as tolerated      Mobility  Bed Mobility Overal bed mobility: Needs Assistance             General bed mobility comments: Not assessed. Pt greeted seated EOB and returned there at end of session.    Transfers Overall transfer level: Needs assistance Equipment used: Rolling walker (2 wheels) Transfers: Sit to/from Stand Sit to Stand: Supervision           General transfer comment: Pt stood from lowest bed height. Cued proper hand placement using RW. Powered up without physical assist. Supervision for safety. Good eccentric control.    Ambulation/Gait Ambulation/Gait assistance: Contact guard assist Gait Distance (Feet): 250 Feet Assistive device: Rolling walker (2 wheels) Gait Pattern/deviations: Step-through pattern, Decreased stride length, Antalgic Gait velocity: decreased Gait velocity interpretation: <1.8 ft/sec, indicate of risk for recurrent falls   General Gait Details: Educated pt on proper and safe use of RW. Demonstrated proper sequencing. Cues for improved technqiue. Pt ambulated with a reciprocal gait pattern. He demonstrated a fwd lean with arms fully extend infront of him. Cues for proximity to RW. Pt intermittently lifted AD off floor. Instructed pt to maintain all four points in contact with the ground at all times. Pt had slight difficulty advancing LLE d/t pain and c/o the wound vac weighing down the RW. Cues for improved safety awareness while navigating hallway. No LOB.  Stairs            Wheelchair Mobility     Tilt Bed    Modified Rankin (Stroke Patients Only)       Balance Overall balance assessment: Needs assistance Sitting-balance support: Feet supported Sitting balance-Leahy Scale: Good  Standing balance support: Bilateral upper extremity supported, During functional activity, Reliant on assistive device for balance Standing balance-Leahy Scale:  Poor Standing balance comment: Pt dependent on RW                             Pertinent Vitals/Pain Pain Assessment Pain Assessment: Faces Faces Pain Scale: Hurts a little bit Pain Location: LLE Pain Descriptors / Indicators: Discomfort Pain Intervention(s): Monitored during session, Limited activity within patient's tolerance    Home Living Family/patient expects to be discharged to:: Shelter/Homeless Living Arrangements: Alone                 Additional Comments: Lost housing 9/1, was planning to go to the street 9/1. Pt states he was wearing O2 sometimes at night    Prior Function Prior Level of Function : Independent/Modified Independent             Mobility Comments: Ambulates without AD. Pt reports intermittently using SPC and/or RW, when he needs it. Unclear if he still has the DME. Pt denies fall history. ADLs Comments: Indep with ADLs/IADLs.     Extremity/Trunk Assessment   Upper Extremity Assessment Upper Extremity Assessment: Defer to OT evaluation    Lower Extremity Assessment Lower Extremity Assessment: LLE deficits/detail (RLE overall WFL for tasks assessed) LLE Deficits / Details: Pt demonstrated limited hip/knee AROM d/t pain. Grossly 3/5 strength. Ankle AROM and strength WFL. LLE: Unable to fully assess due to pain LLE Sensation: WNL LLE Coordination: decreased gross motor    Cervical / Trunk Assessment Cervical / Trunk Assessment: Normal  Communication   Communication Communication: Impaired Factors Affecting Communication: Hearing impaired    Cognition Arousal: Alert Behavior During Therapy: WFL for tasks assessed/performed   PT - Cognitive impairments: No family/caregiver present to determine baseline, Orientation   Orientation impairments: Place, Time (Pt stated he was in some kind of rehab. He could not provide city, state, or pick hospital when given three options. Pt did not know the date.)                    PT - Cognition Comments: Pt A,Ox2. He reported he was tired of being asked so many questions since being in the hospital and stopped responding. Following commands: Impaired Following commands impaired: Follows one step commands with increased time, Follows multi-step commands with increased time     Cueing Cueing Techniques: Verbal cues, Gestural cues, Tactile cues, Visual cues     General Comments General comments (skin integrity, edema, etc.): VSS on RA    Exercises     Assessment/Plan    PT Assessment Patient needs continued PT services  PT Problem List Decreased strength;Decreased range of motion;Decreased activity tolerance;Decreased balance;Decreased mobility;Decreased knowledge of use of DME;Decreased safety awareness       PT Treatment Interventions DME instruction;Gait training;Stair training;Functional mobility training;Therapeutic activities;Therapeutic exercise;Balance training;Patient/family education    PT Goals (Current goals can be found in the Care Plan section)  Acute Rehab PT Goals Patient Stated Goal: Regain independence and not need an AD PT Goal Formulation: With patient Time For Goal Achievement: 07/18/24 Potential to Achieve Goals: Good    Frequency Min 2X/week     Co-evaluation               AM-PAC PT 6 Clicks Mobility  Outcome Measure Help needed turning from your back to your side while in a flat bed without using bedrails?: A Little Help  needed moving from lying on your back to sitting on the side of a flat bed without using bedrails?: A Little Help needed moving to and from a bed to a chair (including a wheelchair)?: A Little Help needed standing up from a chair using your arms (e.g., wheelchair or bedside chair)?: A Little Help needed to walk in hospital room?: A Little Help needed climbing 3-5 steps with a railing? : A Lot 6 Click Score: 17    End of Session Equipment Utilized During Treatment: Gait belt Activity Tolerance:  Patient tolerated treatment well Patient left: in bed;with call bell/phone within reach;with bed alarm set Nurse Communication: Mobility status PT Visit Diagnosis: Difficulty in walking, not elsewhere classified (R26.2);Unsteadiness on feet (R26.81)    Time: 1209-1225 PT Time Calculation (min) (ACUTE ONLY): 16 min   Charges:   PT Evaluation $PT Eval Moderate Complexity: 1 Mod   PT General Charges $$ ACUTE PT VISIT: 1 Visit         Randall SAUNDERS, PT, DPT Acute Rehabilitation Services Office: [phone] Secure Chat Preferred  [name] 07/04/2024, 1:19 PM

## 2024-07-04 NOTE — TOC Initial Note (Signed)
 Transition of Care [facility]) - Initial/Assessment Note    Patient Details  Name: Robert Sanchez MRN: [id number] Date of Birth: 29-Dec-1955  [facility] CM/SW Contact:    Bridget Cordella Simmonds, LCSW Phone Number: 07/04/2024, 4:13 PM  Clinical Narrative:      Follow up conversation related to DC plan.  Pt now with PT recommendation for SNF.  Uninsured, per pt, has not signed up for medicare.  Discussed progress since several days ago: pt was visited by a former coworker at [employer], [employer] [phone].  She has paid to get his phone turned back on and said she may be able to assist with housing.  Permission given to call her.  CSW unable to reach by phone, voicemail full, message sent.               Expected Discharge Plan:  (TBD) Barriers to Discharge: Homeless with medical needs   Patient Goals and CMS Choice            Expected Discharge Plan and Services In-house Referral: Clinical Social Work     Living arrangements for the past 2 months: Apartment Expected Discharge Date: 06/30/24                                    Prior Living Arrangements/Services Living arrangements for the past 2 months: Apartment Lives with:: Self Patient language and need for interpreter reviewed:: Yes Do you feel safe going back to the place where you live?: No   currently homeless  Need for Family Participation in Patient Care: Yes (Comment) Care giver support system in place?: Yes (comment) Current home services: Other (comment) (none) Criminal Activity/Legal Involvement Pertinent to Current Situation/Hospitalization: No - Comment as needed  Activities of Daily Living   ADL Screening (condition at time of admission) Independently performs ADLs?: No Does the patient have a NEW difficulty with bathing/dressing/toileting/self-feeding that is expected to last >3 days?: Yes (Initiates electronic notice to provider for possible OT consult) Does the patient  have a NEW difficulty with getting in/out of bed, walking, or climbing stairs that is expected to last >3 days?: Yes (Initiates electronic notice to provider for possible PT consult) Does the patient have a NEW difficulty with communication that is expected to last >3 days?: No Is the patient deaf or have difficulty hearing?: No Does the patient have difficulty seeing, even when wearing glasses/contacts?: No Does the patient have difficulty concentrating, remembering, or making decisions?: No  Permission Sought/Granted                  Emotional Assessment Appearance:: Appears older than stated age Attitude/Demeanor/Rapport: Engaged Affect (typically observed): Appropriate Orientation: : Oriented to Self, Oriented to Place, Oriented to Situation      Admission diagnosis:  Laceration of multiple sites of left lower extremity, initial encounter [D18.187J] Status post surgery [Z98.890] Injury of left lower extremity [S89.92XA] Patient Active Problem List   Diagnosis Date Noted   Status post surgery 06/29/2024   Injury of left lower extremity 06/29/2024   Emphysema lung (HCC) 04/24/2024   COPD with acute exacerbation (HCC) 06/06/2022   Class 1 obesity    CKD (chronic kidney disease) stage 2, GFR 60-89 ml/min    Pneumonia due to COVID-19 virus 06/05/2022   CHF (congestive heart failure) (HCC) 08/28/2021   Acute hypoxemic respiratory failure (HCC) 08/28/2021   Hyperglycemia 08/28/2021  AKI (acute kidney injury) (HCC) 08/28/2021   Elevated troponin 08/28/2021   Hypertension associated with diabetes (HCC) 02/19/2021   Critical limb ischemia with history of revascularization of same extremity (HCC) 01/06/2021   Acute venous embolism and thrombosis of deep vessels of proximal lower extremity (HCC) 07/16/2020   Cellulitis of foot 01/15/2020   Critical ischemia of extremity with history of revascularization of same extremity (HCC) 01/15/2020   Leukocytosis 01/15/2020   CKD (chronic  kidney disease), stage III (HCC) 01/15/2020   Acute on chronic systolic CHF (congestive heart failure) (HCC) 05/04/2019   Mild renal insufficiency 05/04/2019   LFT elevation 05/04/2019   PAD (peripheral artery disease) (HCC) 08/21/2018   PVD (peripheral vascular disease) (HCC) 07/18/2018   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, [address] [place] [address] Phone: [phone] Fax: [phone]     Social Drivers of Health (SDOH) Social History: SDOH Screenings   Food Insecurity: No Food Insecurity (06/30/2024)  Housing: Low Risk  (06/30/2024)  Transportation Needs: No Transportation Needs (06/30/2024)  Utilities: Not At Risk (06/30/2024)  Alcohol Screen: Low Risk  (01/10/2023)  Depression (PHQ2-9): Low Risk  (04/24/2024)  Financial Resource Strain: Low Risk  (01/10/2023)  Physical Activity: Inactive (01/10/2023)  Social Connections: Patient Declined (06/30/2024)  Stress: No Stress Concern Present (01/10/2023)  Recent Concern: Stress - Stress Concern Present (01/10/2023)  Tobacco Use: High Risk (06/30/2024)   SDOH Interventions:     Readmission Risk Interventions    06/07/2022    2:19 PM  Readmission Risk Prevention Plan  Transportation Screening Complete  PCP or Specialist Appt within 3-5 Days Complete  HRI or Home Care Consult Complete  Social Work Consult for Recovery Care Planning/Counseling Complete  Palliative Care Screening Not Applicable  Medication Review Oceanographer)  Complete

## 2024-07-04 NOTE — Progress Notes (Signed)
 The patient is increasingly getting confusion and exhibiting bizarre behavior started on day shift per RN on day shift. He has just pulled his telemetry off and disconnected his IV infusion  to be dripping on the floor. He's pulling the wound vac  cord while the machine is attached to the bed. He's refusing assistance from staff  to walk around. He will not allow this RN to apply the tele back on . Notified Dr. Serene and received order for Haldol   2 mg IV and bilateral upper and lower restraints.  He is expressing racist remarks towards this RN. Saying, I'm white and you're black. You know I'm better than you. He was saying and laughing about it while I was on the phone with provider getting orders.

## 2024-07-04 NOTE — Plan of Care (Signed)
  Problem: Education: Goal: Knowledge of General Education information will improve Description: Including pain rating scale, medication(s)/side effects and non-pharmacologic comfort measures Outcome: Progressing   Problem: Clinical Measurements: Goal: Will remain free from infection Outcome: Progressing   Problem: Elimination: Goal: Will not experience complications related to urinary retention Outcome: Progressing   Problem: Skin Integrity: Goal: Risk for impaired skin integrity will decrease Outcome: Not Progressing   Problem: Skin Integrity: Goal: Risk for impaired skin integrity will decrease Outcome: Not Progressing

## 2024-07-04 NOTE — Anesthesia Postprocedure Evaluation (Signed)
 Anesthesia Post Note  Patient: Robert Sanchez  Procedure(s) Performed: IRRIGATION AND EXCISIONAL DEBRIDEMENT OF TRAUMATIC WOUNDS OF LEFT LOWER EXTREMITY (Left: Leg Lower) PARTIAL EXCISION OF BYPASS GRAFT  LEFT FEMORAL-ANTERIOR TIBIAL ARTERY (Left: Leg Upper) APPLICATION, WOUND VAC TO LEFT LOWER EXTREMITY (Left: Leg Upper)     Patient location during evaluation: PACU Anesthesia Type: General Level of consciousness: awake and alert Pain management: pain level controlled Vital Signs Assessment: post-procedure vital signs reviewed and stable Respiratory status: spontaneous breathing, nonlabored ventilation, respiratory function stable and patient connected to nasal cannula oxygen Cardiovascular status: blood pressure returned to baseline and stable Postop Assessment: no apparent nausea or vomiting Anesthetic complications: no   No notable events documented.            Robert Sanchez

## 2024-07-04 NOTE — Progress Notes (Addendum)
  Progress Note    07/04/2024 8:23 AM 5 Days Post-Op  Subjective:  no complaints   Vitals:   07/04/24 0430 07/04/24 0741  BP: 123/67 101/61  Pulse: 71 78  Resp: 15   Temp: 98.4 F (36.9 C) 98 F (36.7 C)  SpO2: 100% 93%   Physical Exam Lungs:  non labored Incisions:  wound vac x2 with good seal Extremities:  feet symmetrically warm Neurologic: A&O  CBC    Component Value Date/Time   WBC 11.3 (H) 06/30/2024 0721   RBC 4.75 06/30/2024 0721   HGB 13.4 06/30/2024 0721   HCT 41.4 06/30/2024 0721   PLT 311 06/30/2024 0721   MCV 87.2 06/30/2024 0721   MCH 28.2 06/30/2024 0721   MCHC 32.4 06/30/2024 0721   RDW 16.3 (H) 06/30/2024 0721   LYMPHSABS 1.7 06/29/2024 1215   MONOABS 0.8 06/29/2024 1215   EOSABS 0.7 (H) 06/29/2024 1215   BASOSABS 0.1 06/29/2024 1215    BMET    Component Value Date/Time   NA 133 (L) 07/04/2024 0417   NA 140 04/24/2024 0928   K 5.7 (H) 07/04/2024 0417   CL 99 07/04/2024 0417   CO2 23 07/04/2024 0417   GLUCOSE 115 (H) 07/04/2024 0417   BUN 44 (H) 07/04/2024 0417   BUN 14 04/24/2024 0928   CREATININE 1.76 (H) 07/04/2024 0417   CREATININE 0.81 08/14/2014 1126   CALCIUM  8.7 (L) 07/04/2024 0417   GFRNONAA 42 (L) 07/04/2024 0417   GFRNONAA >89 08/14/2014 1126   GFRAA 58 (L) 08/05/2020 0524   GFRAA >89 08/14/2014 1126    INR    Component Value Date/Time   INR 1.3 (H) 06/05/2022 1259     Intake/Output Summary (Last 24 hours) at 07/04/2024 9176 Last data filed at 07/04/2024 0636 Gross per 24 hour  Intake 1078 ml  Output 200 ml  Net 878 ml     Assessment/Plan:  68 y.o. male is s/p   1) partial excision of left common femoral - anterior tibial artery bypass (CPT [phone]) 2) sharp excisional debridement of left lower extremity (CPT 11043) 3) initiation of wound vac therapy to wounds of left lower extremity (CPT 97605)  5 Days Post-Op   LLE warm and well perfused VAC replaced yesterday by WOC Unable to obtain home vac and as patient is  homeless; Hopeful SW can assist with discharge planning   Robert Sender, PA-C [facility] [phone] 07/04/2024 8:23 AM  VASCULAR STAFF ADDENDUM: I have independently interviewed and examined the patient. I agree with the above.  Remains medically stable for discharge.  Robert Sanchez. Magda, MD [facility] Phone Number: [phone] 07/04/2024 4:30 PM

## 2024-07-05 LAB — BASIC METABOLIC PANEL WITH GFR
Anion gap: 12 (ref 5–15)
BUN: 40 mg/dL — ABNORMAL HIGH (ref 8–23)
CO2: 20 mmol/L — ABNORMAL LOW (ref 22–32)
Calcium: 8.3 mg/dL — ABNORMAL LOW (ref 8.9–10.3)
Chloride: 100 mmol/L (ref 98–111)
Creatinine, Ser: 1.55 mg/dL — ABNORMAL HIGH (ref 0.61–1.24)
GFR, Estimated: 49 mL/min — ABNORMAL LOW (ref >60)
Glucose, Bld: 108 mg/dL — ABNORMAL HIGH (ref 70–99)
Potassium: 4.4 mmol/L (ref 3.5–5.1)
Sodium: 132 mmol/L — ABNORMAL LOW (ref 135–145)

## 2024-07-05 LAB — GLUCOSE, CAPILLARY
Glucose-Capillary: 108 mg/dL — ABNORMAL HIGH (ref 70–99)
Glucose-Capillary: 151 mg/dL — ABNORMAL HIGH (ref 70–99)
Glucose-Capillary: 133 mg/dL — ABNORMAL HIGH (ref 70–99)
Glucose-Capillary: 140 mg/dL — ABNORMAL HIGH (ref 70–99)
Glucose-Capillary: 111 mg/dL — ABNORMAL HIGH (ref 70–99)

## 2024-07-05 NOTE — Progress Notes (Addendum)
  Progress Note    07/05/2024 8:30 AM 6 Days Post-Op  Subjective:  agitated overnight    Vitals:   07/05/24 0405 07/05/24 0800  BP: (!) 115/56 119/64  Pulse: 73 77  Resp: 15 16  Temp: 98.7 F (37.1 C) 98.4 F (36.9 C)  SpO2: 94% 95%   Physical Exam: Lungs:  non labored Incisions:  LLE dressing left in place; vacs with good seal Neurologic: A&O  CBC    Component Value Date/Time   WBC 11.3 (H) 06/30/2024 0721   RBC 4.75 06/30/2024 0721   HGB 13.4 06/30/2024 0721   HCT 41.4 06/30/2024 0721   PLT 311 06/30/2024 0721   MCV 87.2 06/30/2024 0721   MCH 28.2 06/30/2024 0721   MCHC 32.4 06/30/2024 0721   RDW 16.3 (H) 06/30/2024 0721   LYMPHSABS 1.7 06/29/2024 1215   MONOABS 0.8 06/29/2024 1215   EOSABS 0.7 (H) 06/29/2024 1215   BASOSABS 0.1 06/29/2024 1215    BMET    Component Value Date/Time   NA 132 (L) 07/05/2024 0410   NA 140 04/24/2024 0928   K 4.4 07/05/2024 0410   CL 100 07/05/2024 0410   CO2 20 (L) 07/05/2024 0410   GLUCOSE 108 (H) 07/05/2024 0410   BUN 40 (H) 07/05/2024 0410   BUN 14 04/24/2024 0928   CREATININE 1.55 (H) 07/05/2024 0410   CREATININE 0.81 08/14/2014 1126   CALCIUM  8.3 (L) 07/05/2024 0410   GFRNONAA 49 (L) 07/05/2024 0410   GFRNONAA >89 08/14/2014 1126   GFRAA 58 (L) 08/05/2020 0524   GFRAA >89 08/14/2014 1126    INR    Component Value Date/Time   INR 1.3 (H) 06/05/2022 1259     Intake/Output Summary (Last 24 hours) at 07/05/2024 0830 Last data filed at 07/05/2024 0700 Gross per 24 hour  Intake 2261.99 ml  Output 1400 ml  Net 861.99 ml     Assessment/Plan:  68 y.o. male is s/p  1) partial excision of left common femoral - anterior tibial artery bypass (CPT [phone]) 2) sharp excisional debridement of left lower extremity (CPT 11043) 3) initiation of wound vac therapy to wounds of left lower extremity (CPT 97605) 6 Days Post-Op   Feeling better this morning.  Unaware of agitation overnight. Wound VAC to the left leg with good  seal.  WOC RN VAC change tomorrow. Homeless and uninsured.  Hopeful for SNF placement. Cr bumped yesterday.  Spironolactone  held; gentle IV hydration ordered; Cr improving today; check daily BMP   Donnice Sender, PA-C [facility] [phone] 07/05/2024 8:30 AM  VASCULAR STAFF ADDENDUM: I agree with the above.   Debby SAILOR. Magda, MD [facility] Phone Number: [phone] 07/05/2024 1:03 PM

## 2024-07-05 NOTE — Progress Notes (Signed)
 Haldol  IV was effective so restraints were not applied. Patient is resting well without any more issues. Still refusing the tele. Will continue to monitor.

## 2024-07-05 NOTE — Plan of Care (Signed)
  Problem: Clinical Measurements: Goal: Respiratory complications will improve Outcome: Progressing   Problem: Elimination: Goal: Will not experience complications related to urinary retention Outcome: Progressing   Problem: Education: Goal: Knowledge of General Education information will improve Description: Including pain rating scale, medication(s)/side effects and non-pharmacologic comfort measures Outcome: Not Progressing   Problem: Pain Managment: Goal: General experience of comfort will improve and/or be controlled Outcome: Not Progressing   Problem: Skin Integrity: Goal: Risk for impaired skin integrity will decrease Outcome: Not Progressing

## 2024-07-06 LAB — CBC
HCT: 34.7 % — ABNORMAL LOW (ref 39.0–52.0)
Hemoglobin: 11.4 g/dL — ABNORMAL LOW (ref 13.0–17.0)
MCH: 28.6 pg (ref 26.0–34.0)
MCHC: 32.9 g/dL (ref 30.0–36.0)
MCV: 87.2 fL (ref 80.0–100.0)
Platelets: 306 K/uL (ref 150–400)
RBC: 3.98 MIL/uL — ABNORMAL LOW (ref 4.22–5.81)
RDW: 16 % — ABNORMAL HIGH (ref 11.5–15.5)
WBC: 10.2 K/uL (ref 4.0–10.5)
nRBC: 0 % (ref 0.0–0.2)

## 2024-07-06 LAB — BASIC METABOLIC PANEL WITH GFR
Anion gap: 7 (ref 5–15)
BUN: 30 mg/dL — ABNORMAL HIGH (ref 8–23)
CO2: 21 mmol/L — ABNORMAL LOW (ref 22–32)
Calcium: 8.6 mg/dL — ABNORMAL LOW (ref 8.9–10.3)
Chloride: 107 mmol/L (ref 98–111)
Creatinine, Ser: 1.24 mg/dL (ref 0.61–1.24)
GFR, Estimated: >60 mL/min (ref >60)
Glucose, Bld: 121 mg/dL — ABNORMAL HIGH (ref 70–99)
Potassium: 4.6 mmol/L (ref 3.5–5.1)
Sodium: 135 mmol/L (ref 135–145)

## 2024-07-06 LAB — GLUCOSE, CAPILLARY
Glucose-Capillary: 112 mg/dL — ABNORMAL HIGH (ref 70–99)
Glucose-Capillary: 130 mg/dL — ABNORMAL HIGH (ref 70–99)
Glucose-Capillary: 102 mg/dL — ABNORMAL HIGH (ref 70–99)
Glucose-Capillary: 129 mg/dL — ABNORMAL HIGH (ref 70–99)
Glucose-Capillary: 130 mg/dL — ABNORMAL HIGH (ref 70–99)

## 2024-07-06 NOTE — TOC Progression Note (Addendum)
 Transition of Care [facility]) - Progression Note    Patient Details  Name: Robert Sanchez MRN: [id number] Date of Birth: 10-14-1956  Transition of Care [facility] CM/SW Contact  Bridget Cordella Simmonds, LCSW Phone Number: 07/06/2024, 2:05 PM  Clinical Narrative:   CSW spoke with Michaela/[facility]: she confirmed that Welfare reform can assist with hotel funding for homeless person. [facility] does not provide case management services, but once pt DC from the hospital, he can call back to get on list for permanent housing.    1300CSW spoke with Welfare reform, was transferred to [employer], left message.   1345: TC Jesse/ICM supervisor: discussed situation, she will look into SNF options for uninsured pt.  She is in agreement with APS report being made and also suggested referral to [employer].   1415: Message left with [facility] DHHS/APS to make report. 1520: Second attempt to call [place] APS, still getting voicemail.   1530: TC Latosha/Welfare reform.  They fund hotels if there is a permanent housing plan and the hotel is needed as a bridge to this permanent hours.  If pt does not have a permanent plan, they cannot just fund a hotel stay.  They do assist with housing deposits for permanent housing.   Expected Discharge Plan:  (TBD) Barriers to Discharge: Homeless with medical needs               Expected Discharge Plan and Services In-house Referral: Clinical Social Work     Living arrangements for the past 2 months: Apartment Expected Discharge Date: 06/30/24                                     Social Drivers of Health (SDOH) Interventions SDOH Screenings   Food Insecurity: No Food Insecurity (06/30/2024)  Housing: Low Risk  (06/30/2024)  Transportation Needs: No Transportation Needs (06/30/2024)  Utilities: Not At Risk (06/30/2024)  Alcohol Screen: Low Risk  (01/10/2023)  Depression (PHQ2-9): Low Risk   (04/24/2024)  Financial Resource Strain: Low Risk  (01/10/2023)  Physical Activity: Inactive (01/10/2023)  Social Connections: Patient Declined (06/30/2024)  Stress: No Stress Concern Present (01/10/2023)  Recent Concern: Stress - Stress Concern Present (01/10/2023)  Tobacco Use: High Risk (06/30/2024)    Readmission Risk Interventions    06/07/2022    2:19 PM  Readmission Risk Prevention Plan  Transportation Screening Complete  PCP or Specialist Appt within 3-5 Days Complete  HRI or Home Care Consult Complete  Social Work Consult for Recovery Care Planning/Counseling Complete  Palliative Care Screening Not Applicable  Medication Review Oceanographer) Complete

## 2024-07-06 NOTE — Progress Notes (Addendum)
  Progress Note    07/06/2024 7:55 AM 7 Days Post-Op  Subjective:  no complaints   Vitals:   07/05/24 1941 07/06/24 0423  BP: (!) 131/59 (!) 117/47  Pulse: 67 66  Resp:  15  Temp: 97.7 F (36.5 C) 97.6 F (36.4 C)  SpO2: 97% 90%   Physical Exam: Lungs:  non labored Incisions:  vacs with good seal LLE Extremities:  L foot warm to touch Neurologic: A&O  CBC    Component Value Date/Time   WBC 10.2 07/06/2024 0432   RBC 3.98 (L) 07/06/2024 0432   HGB 11.4 (L) 07/06/2024 0432   HCT 34.7 (L) 07/06/2024 0432   PLT 306 07/06/2024 0432   MCV 87.2 07/06/2024 0432   MCH 28.6 07/06/2024 0432   MCHC 32.9 07/06/2024 0432   RDW 16.0 (H) 07/06/2024 0432   LYMPHSABS 1.7 06/29/2024 1215   MONOABS 0.8 06/29/2024 1215   EOSABS 0.7 (H) 06/29/2024 1215   BASOSABS 0.1 06/29/2024 1215    BMET    Component Value Date/Time   NA 135 07/06/2024 0432   NA 140 04/24/2024 0928   K 4.6 07/06/2024 0432   CL 107 07/06/2024 0432   CO2 21 (L) 07/06/2024 0432   GLUCOSE 121 (H) 07/06/2024 0432   BUN 30 (H) 07/06/2024 0432   BUN 14 04/24/2024 0928   CREATININE 1.24 07/06/2024 0432   CREATININE 0.81 08/14/2014 1126   CALCIUM  8.6 (L) 07/06/2024 0432   GFRNONAA >60 07/06/2024 0432   GFRNONAA >89 08/14/2014 1126   GFRAA 58 (L) 08/05/2020 0524   GFRAA >89 08/14/2014 1126    INR    Component Value Date/Time   INR 1.3 (H) 06/05/2022 1259     Intake/Output Summary (Last 24 hours) at 07/06/2024 0755 Last data filed at 07/06/2024 0300 Gross per 24 hour  Intake 480 ml  Output 2225 ml  Net -1745 ml     Assessment/Plan:  68 y.o. male is s/p 1) partial excision of left common femoral - anterior tibial artery bypass (CPT [phone]) 2) sharp excisional debridement of left lower extremity (CPT 11043) 3) initiation of wound vac therapy to wounds of left lower extremity (CPT 97605)  7 Days Post-Op   LLE well perfused Vac change today with WOC RN; pre-medicate as needed TOC re-consulted for  possible SNF placement   Donnice Sender, PA-C [facility] [phone] 07/06/2024 7:55 AM  VASCULAR STAFF ADDENDUM: I have independently interviewed and examined the patient. I agree with the above.  Medically stable for discharge. Awaiting safe discharge plan.  Debby SAILOR. Magda, MD [facility] Phone Number: [phone] 07/06/2024 2:57 PM

## 2024-07-06 NOTE — TOC Progression Note (Addendum)
 Transition of Care [facility]) - Progression Note    Patient Details  Name: Robert Sanchez MRN: [id number] Date of Birth: 10/29/1956  Transition of Care [facility]) CM/SW Contact  Bridget Cordella Simmonds, LCSW Phone Number: 07/06/2024, 1:59 PM  Clinical Narrative:   CSW spoke with pt, his phone is back on.  He was irritable today, told CSW to not call Tonya at work, doesn't want to get her in trouble.   1400CSW spoke with [name] Brazen, who confirms that pt she is able to provide some support.  Pt worked for [employer] for 40 years and [employer] actually provided the funds to turn pt phone back on.  Tonya is open to having pt stay with her on temporary basis.  She does have to clean out a room for him to use and this is going to take her through the weekend.    1500: Message from [name]: she spoke with her landlord and he said pt can only stay with her 2 weeks due to not being on the lease.  She also spoke with [facility] and the advised her to call Welfare reform liaison project about possible hotel funding.      Expected Discharge Plan:  (TBD) Barriers to Discharge: Homeless with medical needs               Expected Discharge Plan and Services In-house Referral: Clinical Social Work     Living arrangements for the past 2 months: Apartment Expected Discharge Date: 06/30/24                                     Social Drivers of Health (SDOH) Interventions SDOH Screenings   Food Insecurity: No Food Insecurity (06/30/2024)  Housing: Low Risk  (06/30/2024)  Transportation Needs: No Transportation Needs (06/30/2024)  Utilities: Not At Risk (06/30/2024)  Alcohol Screen: Low Risk  (01/10/2023)  Depression (PHQ2-9): Low Risk  (04/24/2024)  Financial Resource Strain: Low Risk  (01/10/2023)  Physical Activity: Inactive (01/10/2023)  Social Connections: Patient Declined (06/30/2024)  Stress: No Stress Concern Present (01/10/2023)  Recent Concern: Stress - Stress  Concern Present (01/10/2023)  Tobacco Use: High Risk (06/30/2024)    Readmission Risk Interventions    06/07/2022    2:19 PM  Readmission Risk Prevention Plan  Transportation Screening Complete  PCP or Specialist Appt within 3-5 Days Complete  HRI or Home Care Consult Complete  Social Work Consult for Recovery Care Planning/Counseling Complete  Palliative Care Screening Not Applicable  Medication Review Oceanographer) Complete

## 2024-07-06 NOTE — Consult Note (Signed)
 WOC Nurse wound follow up Wound type: surgical  S/P 1) partial excision of left common femoral - anterior tibial artery bypass.  2) sharp excisional debridement of left lower extremity  Measurement: distal/lateral LLE 8cm x 6.5cm x 0.5cm. proximal LLE latera thigh 3cm x 4.5cm x 0.5cm  Wound bed: early granulation tissue, exposed vasculature  Drainage (amount, consistency, odor) minimal in VAC canister, serosanguinous  Periwound:intact  Dressing procedure/placement/frequency: Removed old NPWT dressing (3pc black) Cleansed wound with normal saline Covered each wound with  _1__ piece of Mepitel, __1__ piece of black foam (2) total  Skin protected between two wounds with VAC drape for bridge between each wound  Sealed NPWT dressing at HG Patient received PO pain medication per bedside nurse prior to dressing change Patient tolerated procedure well    WOC nurse will continue to provide NPWT dressing changed due to the complexity of the dressing change.    Supplies in the room [employer], CNS, MAINE [phone]

## 2024-07-06 NOTE — Progress Notes (Signed)
 Physical Therapy Treatment Patient Details Name: Robert Sanchez MRN: [id number] DOB: 07-Oct-1956 Today's Date: 07/06/2024   History of Present Illness Robert Sanchez  68 y.o. male who presented with a LLE wound from a boat propeller. 8/29 washout of wounds and partial excision of prosthetic bypass. PMH includes tobacco use, COPD, DM, neuropathy, CAD, HLD, MI, PVD, arthritis, HOH.    PT Comments  Pt greeted supine in bed, pleasant and agreeable to PT session. He is making steady progress towards his acute PT goals. Pt engaged in stair training. Educated pt to ascend with RLE and descend with LLE. He completed two flights of stairs with unilateral UE on handrail and PT managing wound vac. Gait distance limited d/t fatigue following stairs. Will continue to follow acutely and advance appropriately.      If plan is discharge home, recommend the following: A little help with walking and/or transfers;A little help with bathing/dressing/bathroom;Assistance with cooking/housework;Assist for transportation;Help with stairs or ramp for entrance   Can travel by private vehicle        Equipment Recommendations  Rolling walker (2 wheels)    Recommendations for Other Services       Precautions / Restrictions Precautions Precautions: Fall Recall of Precautions/Restrictions: Intact Precaution/Restrictions Comments: Wound Vac Restrictions Weight Bearing Restrictions Per Provider Order: Yes LLE Weight Bearing Per Provider Order: Weight bearing as tolerated     Mobility  Bed Mobility Overal bed mobility: Modified Independent             General bed mobility comments: Pt performed supine<>sit with HOB elevated and use of bedrails.    Transfers Overall transfer level: Needs assistance Equipment used: Rolling walker (2 wheels) Transfers: Sit to/from Stand Sit to Stand: Supervision           General transfer comment: Pt stood from lowest bed height. Cued proper hand placement.  No assist to power up. Good eccentric control.    Ambulation/Gait Ambulation/Gait assistance: Contact guard assist Gait Distance (Feet): 40 Feet Assistive device: Rolling walker (2 wheels) Gait Pattern/deviations: Step-through pattern, Decreased stride length Gait velocity: decreased Gait velocity interpretation: <1.8 ft/sec, indicate of risk for recurrent falls   General Gait Details: Pt ambulated with a reciprocal gait pattern. He demonstrated good proximity to RW and navigated room/hallway well. Pt benefitted from PT holding wound vac so it didn't make it difficulty to mobilize RW, weighing down one side. Distance limited d/t pt's fatigue following stairs.   Stairs Stairs: Yes Stairs assistance: Contact guard assist Stair Management: One rail Right, Forwards, Step to pattern Number of Stairs: 20 General stair comments: Educated pt to ascend with RLE and descend with LLE. Pt completed 2x10 steps with a standing rest break at the top. No LOB. Unilateral UE on rail at all times. PT held wound vac throughout.   Wheelchair Mobility     Tilt Bed    Modified Rankin (Stroke Patients Only)       Balance Overall balance assessment: Needs assistance Sitting-balance support: Feet supported Sitting balance-Leahy Scale: Normal     Standing balance support: Bilateral upper extremity supported, During functional activity, Reliant on assistive device for balance Standing balance-Leahy Scale: Poor Standing balance comment: Pt dependent on RW or railing                            Communication Communication Communication: Impaired Factors Affecting Communication: Hearing impaired  Cognition Arousal: Alert Behavior During Therapy: [facility] for tasks  assessed/performed   PT - Cognitive impairments: No family/caregiver present to determine baseline                         Following commands: Impaired Following commands impaired: Follows one step commands with  increased time, Follows multi-step commands with increased time    Cueing Cueing Techniques: Verbal cues  Exercises      General Comments General comments (skin integrity, edema, etc.): VSS on RA      Pertinent Vitals/Pain Pain Assessment Pain Assessment: Faces Faces Pain Scale: Hurts a little bit Pain Location: LLE Pain Descriptors / Indicators: Discomfort Pain Intervention(s): Monitored during session, Limited activity within patient's tolerance, Repositioned    Home Living                          Prior Function            PT Goals (current goals can now be found in the care plan section) Progress towards PT goals: Progressing toward goals    Frequency    Min 1X/week      PT Plan      Co-evaluation              AM-PAC PT 6 Clicks Mobility   Outcome Measure  Help needed turning from your back to your side while in a flat bed without using bedrails?: None Help needed moving from lying on your back to sitting on the side of a flat bed without using bedrails?: None Help needed moving to and from a bed to a chair (including a wheelchair)?: A Little Help needed standing up from a chair using your arms (e.g., wheelchair or bedside chair)?: A Little Help needed to walk in hospital room?: A Little Help needed climbing 3-5 steps with a railing? : A Little 6 Click Score: 20    End of Session Equipment Utilized During Treatment: Gait belt Activity Tolerance: Patient tolerated treatment well;Patient limited by fatigue Patient left: in bed;with call bell/phone within reach Nurse Communication: Mobility status PT Visit Diagnosis: Difficulty in walking, not elsewhere classified (R26.2);Unsteadiness on feet (R26.81)     Time: 8383-8371 PT Time Calculation (min) (ACUTE ONLY): 12 min  Charges:    $Gait Training: 8-22 mins PT General Charges $$ ACUTE PT VISIT: 1 Visit                     Randall SAUNDERS, PT, DPT Acute Rehabilitation Services Office:  [phone] Secure Chat Preferred  Robert Sanchez 07/06/2024, 5:57 PM

## 2024-07-06 NOTE — Progress Notes (Signed)
 Occupational Therapy Treatment Patient Details Name: JESIAH GRISMER MRN: [id number] DOB: 1956-08-04 Today's Date: 07/06/2024   History of present illness SAYAN ALDAVA II  68 y.o. male who presented with a LLE wound from a boat propeller. 8/29 washout of wounds and partial excision of prosthetic bypass. PMH includes tobacco use, COPD, DM, neuropathy, CAD, HLD, MI, PVD, arthritis, HOH.   OT comments  Pt is making great progress towards their acute OT goals. He tolerated OOB ADLs with mod I to superivsion A only. He mobilized with a RW and no overt LOB or safety concerns. Pt reported mild increase in LLE pain with walking. OT to continue to follow acutely to facilitate progress towards established goals. Base on pt's functional performance, he does not need follow up OT.         If plan is discharge home, recommend the following:  A little help with walking and/or transfers;A little help with bathing/dressing/bathroom;Assistance with cooking/housework;Help with stairs or ramp for entrance;Assist for transportation   Equipment Recommendations  Other (comment)       Precautions / Restrictions Precautions Precautions: Fall Recall of Precautions/Restrictions: Intact Precaution/Restrictions Comments: Wound Vac Restrictions Weight Bearing Restrictions Per Provider Order: Yes LLE Weight Bearing Per Provider Order: Weight bearing as tolerated       Mobility Bed Mobility Overal bed mobility: Modified Independent                  Transfers Overall transfer level: Needs assistance Equipment used: Rolling walker (2 wheels) Transfers: Sit to/from Stand Sit to Stand: Supervision           General transfer comment: STS without AD, ambulated >household distance with RW     Balance Overall balance assessment: Needs assistance Sitting-balance support: Feet supported Sitting balance-Leahy Scale: Normal     Standing balance support: No upper extremity supported, During  functional activity Standing balance-Leahy Scale: Fair Standing balance comment: donned shirt in standing without LOB or safety concern                           ADL either performed or assessed with clinical judgement   ADL Overall ADL's : Needs assistance/impaired Eating/Feeding: Independent   Grooming: Supervision/safety;Standing           Upper Body Dressing : Modified independent;Standing       Toilet Transfer: Supervision/safety;Ambulation;Rolling walker (2 wheels)           Functional mobility during ADLs: Supervision/safety;Rolling walker (2 wheels) General ADL Comments: no physical assist required throughout, RW used for pain management of LLE    Extremity/Trunk Assessment Upper Extremity Assessment Upper Extremity Assessment: Overall WFL for tasks assessed   Lower Extremity Assessment Lower Extremity Assessment: Defer to PT evaluation        Vision   Vision Assessment?: No apparent visual deficits   Perception Perception Perception: Not tested   Praxis Praxis Praxis: Not tested   Communication Communication Communication: Impaired Factors Affecting Communication: Hearing impaired   Cognition Arousal: Alert Behavior During Therapy: WFL for tasks assessed/performed Cognition: No apparent impairments                               Following commands: Impaired Following commands impaired: Follows one step commands with increased time, Follows multi-step commands with increased time      Cueing   Cueing Techniques: Verbal cues  General Comments VSS on RA    Pertinent Vitals/ Pain       Pain Assessment Pain Assessment: Faces Faces Pain Scale: Hurts a little bit Pain Location: LLE Pain Descriptors / Indicators: Discomfort Pain Intervention(s): Limited activity within patient's tolerance, Monitored during session   Frequency  Min 2X/week        Progress Toward Goals  OT Goals(current goals can now be  found in the care plan section)  Progress towards OT goals: Progressing toward goals  Acute Rehab OT Goals Patient Stated Goal: to get better OT Goal Formulation: With patient Time For Goal Achievement: 07/17/24 Potential to Achieve Goals: Good ADL Goals Pt Will Perform Lower Body Bathing: with modified independence Pt Will Perform Lower Body Dressing: with modified independence;sit to/from stand Pt Will Transfer to Toilet: with modified independence   AM-PAC OT 6 Clicks Daily Activity     Outcome Measure   Help from another person eating meals?: None Help from another person taking care of personal grooming?: A Little Help from another person toileting, which includes using toliet, bedpan, or urinal?: A Little Help from another person bathing (including washing, rinsing, drying)?: A Little Help from another person to put on and taking off regular upper body clothing?: A Little Help from another person to put on and taking off regular lower body clothing?: A Little 6 Click Score: 19    End of Session Equipment Utilized During Treatment: Rolling walker (2 wheels)  OT Visit Diagnosis: Unsteadiness on feet (R26.81);Other abnormalities of gait and mobility (R26.89);Muscle weakness (generalized) (M62.81);Pain   Activity Tolerance Patient tolerated treatment well   Patient Left in bed;with bed alarm set;with call bell/phone within reach   Nurse Communication Mobility status        Time: 8872-8854 OT Time Calculation (min): 18 min  Charges: OT General Charges $OT Visit: 1 Visit OT Treatments $Therapeutic Activity: 8-22 mins  Lucie Kendall, OTR/L Acute Rehabilitation Services Office [phone] Secure Chat Communication Preferred   [name] 07/06/2024, 11:55 AM

## 2024-07-07 ENCOUNTER — Other Ambulatory Visit (HOSPITAL_COMMUNITY): Payer: Self-pay

## 2024-07-07 LAB — BASIC METABOLIC PANEL WITH GFR
Anion gap: 11 (ref 5–15)
BUN: 24 mg/dL — ABNORMAL HIGH (ref 8–23)
CO2: 23 mmol/L (ref 22–32)
Calcium: 8.9 mg/dL (ref 8.9–10.3)
Chloride: 104 mmol/L (ref 98–111)
Creatinine, Ser: 1.05 mg/dL (ref 0.61–1.24)
GFR, Estimated: >60 mL/min (ref >60)
Glucose, Bld: 98 mg/dL (ref 70–99)
Potassium: 4.6 mmol/L (ref 3.5–5.1)
Sodium: 138 mmol/L (ref 135–145)

## 2024-07-07 LAB — GLUCOSE, CAPILLARY
Glucose-Capillary: 100 mg/dL — ABNORMAL HIGH (ref 70–99)
Glucose-Capillary: 117 mg/dL — ABNORMAL HIGH (ref 70–99)
Glucose-Capillary: 85 mg/dL (ref 70–99)
Glucose-Capillary: 140 mg/dL — ABNORMAL HIGH (ref 70–99)

## 2024-07-07 NOTE — Progress Notes (Signed)
  Progress Note    07/07/2024 8:48 AM 8 Days Post-Op  Subjective:  no complaints, worried about his walking ability and discharge disposition   Vitals:   07/07/24 0500 07/07/24 0805  BP: (!) 129/58 119/60  Pulse: 62 70  Resp: 16 16  Temp: 97.7 F (36.5 C) 98.3 F (36.8 C)  SpO2: 93% 99%   Physical Exam: Lungs:  non labored Incisions:  vacs with good seal LLE   CBC    Component Value Date/Time   WBC 10.2 07/06/2024 0432   RBC 3.98 (L) 07/06/2024 0432   HGB 11.4 (L) 07/06/2024 0432   HCT 34.7 (L) 07/06/2024 0432   PLT 306 07/06/2024 0432   MCV 87.2 07/06/2024 0432   MCH 28.6 07/06/2024 0432   MCHC 32.9 07/06/2024 0432   RDW 16.0 (H) 07/06/2024 0432   LYMPHSABS 1.7 06/29/2024 1215   MONOABS 0.8 06/29/2024 1215   EOSABS 0.7 (H) 06/29/2024 1215   BASOSABS 0.1 06/29/2024 1215    BMET    Component Value Date/Time   NA 138 07/07/2024 0638   NA 140 04/24/2024 0928   K 4.6 07/07/2024 0638   CL 104 07/07/2024 0638   CO2 23 07/07/2024 0638   GLUCOSE 98 07/07/2024 0638   BUN 24 (H) 07/07/2024 0638   BUN 14 04/24/2024 0928   CREATININE 1.05 07/07/2024 0638   CREATININE 0.81 08/14/2014 1126   CALCIUM  8.9 07/07/2024 0638   GFRNONAA >60 07/07/2024 0638   GFRNONAA >89 08/14/2014 1126   GFRAA 58 (L) 08/05/2020 0524   GFRAA >89 08/14/2014 1126    INR    Component Value Date/Time   INR 1.3 (H) 06/05/2022 1259     Intake/Output Summary (Last 24 hours) at 07/07/2024 0848 Last data filed at 07/06/2024 1300 Gross per 24 hour  Intake 360 ml  Output --  Net 360 ml     Assessment/Plan:  68 y.o. male is s/p 1) partial excision of left common femoral - anterior tibial artery bypass (CPT [phone]) 2) sharp excisional debridement of left lower extremity (CPT 11043) 3) initiation of wound vac therapy to wounds of left lower extremity (CPT 97605)  8 Days Post-Op   VAC change performed yesterday. Medically stable for discharge awaiting safe discharge plan, appreciate help  from social work and case management  Norman GORMAN Serve MD [facility] Phone Number: [phone] 07/07/2024 8:48 AM

## 2024-07-07 NOTE — Plan of Care (Signed)
  Problem: Education: Goal: Knowledge of General Education information will improve Description: Including pain rating scale, medication(s)/side effects and non-pharmacologic comfort measures Outcome: Progressing   Problem: Health Behavior/Discharge Planning: Goal: Ability to manage health-related needs will improve Outcome: Progressing   Problem: Clinical Measurements: Goal: Ability to maintain clinical measurements within normal limits will improve Outcome: Progressing   Problem: Activity: Goal: Risk for activity intolerance will decrease Outcome: Progressing   Problem: Elimination: Goal: Will not experience complications related to bowel motility Outcome: Progressing   

## 2024-07-07 NOTE — Progress Notes (Signed)
 Mobility Specialist: Progress Note   07/07/24 1557  Mobility  Activity Ambulated with assistance  Level of Assistance Standby assist, set-up cues, supervision of patient - no hands on  Assistive Device Front wheel walker  Distance Ambulated (ft) 750 ft  LLE Weight Bearing Per Provider Order WBAT  Activity Response Tolerated well  Mobility Referral Yes  Mobility visit 1 Mobility  Mobility Specialist Start Time (ACUTE ONLY) 1155  Mobility Specialist Stop Time (ACUTE ONLY) 1222  Mobility Specialist Time Calculation (min) (ACUTE ONLY) 27 min    Pt received on EOB, agreeable to mobility session. SV throughout. C/o L knee soreness and fatigue. Returned to room without fault. Left on EOB with all needs met, call bell in reach.   [name] Mobility Specialist Please contact via SecureChat or Rehab office at [phone]

## 2024-07-08 LAB — GLUCOSE, CAPILLARY
Glucose-Capillary: 119 mg/dL — ABNORMAL HIGH (ref 70–99)
Glucose-Capillary: 121 mg/dL — ABNORMAL HIGH (ref 70–99)
Glucose-Capillary: 83 mg/dL (ref 70–99)
Glucose-Capillary: 100 mg/dL — ABNORMAL HIGH (ref 70–99)

## 2024-07-08 NOTE — Progress Notes (Signed)
  Progress Note    07/08/2024 8:17 AM 9 Days Post-Op  Subjective:  L knee soreness   Vitals:   07/07/24 2104 07/08/24 0527  BP: 138/65 (!) 137/54  Pulse: 63 65  Resp: 17 17  Temp: 97.8 F (36.6 C) 98.1 F (36.7 C)  SpO2: 97% 98%   Physical Exam: Lungs:  non labored Incisions:  vacs with good seal LLE   CBC    Component Value Date/Time   WBC 10.2 07/06/2024 0432   RBC 3.98 (L) 07/06/2024 0432   HGB 11.4 (L) 07/06/2024 0432   HCT 34.7 (L) 07/06/2024 0432   PLT 306 07/06/2024 0432   MCV 87.2 07/06/2024 0432   MCH 28.6 07/06/2024 0432   MCHC 32.9 07/06/2024 0432   RDW 16.0 (H) 07/06/2024 0432   LYMPHSABS 1.7 06/29/2024 1215   MONOABS 0.8 06/29/2024 1215   EOSABS 0.7 (H) 06/29/2024 1215   BASOSABS 0.1 06/29/2024 1215    BMET    Component Value Date/Time   NA 138 07/07/2024 0638   NA 140 04/24/2024 0928   K 4.6 07/07/2024 0638   CL 104 07/07/2024 0638   CO2 23 07/07/2024 0638   GLUCOSE 98 07/07/2024 0638   BUN 24 (H) 07/07/2024 0638   BUN 14 04/24/2024 0928   CREATININE 1.05 07/07/2024 0638   CREATININE 0.81 08/14/2014 1126   CALCIUM  8.9 07/07/2024 0638   GFRNONAA >60 07/07/2024 0638   GFRNONAA >89 08/14/2014 1126   GFRAA 58 (L) 08/05/2020 0524   GFRAA >89 08/14/2014 1126    INR    Component Value Date/Time   INR 1.3 (H) 06/05/2022 1259     Intake/Output Summary (Last 24 hours) at 07/08/2024 0817 Last data filed at 07/07/2024 2106 Gross per 24 hour  Intake 720 ml  Output 400 ml  Net 320 ml     Assessment/Plan:  68 y.o. 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

## 2024-07-08 NOTE — Progress Notes (Signed)
 Mobility Specialist: Progress Note   07/08/24 1500  Mobility  Activity Ambulated with assistance  Level of Assistance Standby assist, set-up cues, supervision of patient - no hands on  Assistive Device Front wheel walker  Distance Ambulated (ft) 1100 ft  LLE Weight Bearing Per Provider Order WBAT  Activity Response Tolerated well  Mobility Referral Yes  Mobility visit 1 Mobility  Mobility Specialist Start Time (ACUTE ONLY) 1250  Mobility Specialist Stop Time (ACUTE ONLY) 1304  Mobility Specialist Time Calculation (min) (ACUTE ONLY) 14 min    Pt received in bed, agreeable to mobility session. SV throughout. C/o L foot swelling and soreness in his L calf. Ambulated two laps around the floor and returned to room without fault. Left on EOB with all needs met, call bell in reach.   [name] Mobility Specialist Please contact via SecureChat or Rehab office at [phone]

## 2024-07-08 NOTE — Plan of Care (Signed)

## 2024-07-09 ENCOUNTER — Other Ambulatory Visit (HOSPITAL_COMMUNITY): Payer: Self-pay

## 2024-07-09 LAB — GLUCOSE, CAPILLARY
Glucose-Capillary: 85 mg/dL (ref 70–99)
Glucose-Capillary: 104 mg/dL — ABNORMAL HIGH (ref 70–99)
Glucose-Capillary: 97 mg/dL (ref 70–99)
Glucose-Capillary: 100 mg/dL — ABNORMAL HIGH (ref 70–99)

## 2024-07-09 NOTE — Plan of Care (Signed)
   Problem: Education: Goal: Knowledge of General Education information will improve Description: Including pain rating scale, medication(s)/side effects and non-pharmacologic comfort measures Outcome: Progressing   Problem: Health Behavior/Discharge Planning: Goal: Ability to manage health-related needs will improve Outcome: Progressing   Problem: Clinical Measurements: Goal: Ability to maintain clinical measurements within normal limits will improve Outcome: Progressing   Problem: Activity: Goal: Risk for activity intolerance will decrease Outcome: Progressing   Problem: Nutrition: Goal: Adequate nutrition will be maintained Outcome: Progressing   Problem: Coping: Goal: Level of anxiety will decrease Outcome: Progressing   Problem: Pain Managment: Goal: General experience of comfort will improve and/or be controlled Outcome: Progressing

## 2024-07-09 NOTE — TOC Progression Note (Addendum)
 Transition of Care [facility]) - Progression Note    Patient Details  Name: Robert Sanchez MRN: [id number] Date of Birth: 1956-09-29  [facility]) CM/SW Contact  Bridget Cordella Simmonds, LCSW Phone Number: 07/09/2024, 3:35 PM  Clinical Narrative:    Report made with Ryan Cardinal,  [facility] APS.  Email from [place]/Financial counseling: pt does qualify for  medicaid and application was completed today.   CSW spoke with [name] Brazen: updated her that hotel through [employer] is not an option.  She is planning on pt staying with her at discharge and will figure things out with her landlord.  Additional info provided: she and pt have made multiple attempts on the social security website to sign him up for retirement benefits, have not been successful.    CSW spoke with pt, discussed the above.  He made a FF appt with  social security for Oct 17, which was first available.    Wound vac issue for uninsured pt remains, Jon CONCH will look into charity option.  Per MD Magda, would be best to continue wound vac but could look at other option.      Expected Discharge Plan:  (TBD) Barriers to Discharge: Homeless with medical needs               Expected Discharge Plan and Services In-house Referral: Clinical Social Work     Living arrangements for the past 2 months: Apartment Expected Discharge Date: 06/30/24                                     Social Drivers of Health (SDOH) Interventions SDOH Screenings   Food Insecurity: No Food Insecurity (06/30/2024)  Housing: Low Risk  (06/30/2024)  Transportation Needs: No Transportation Needs (06/30/2024)  Utilities: Not At Risk (06/30/2024)  Alcohol Screen: Low Risk  (01/10/2023)  Depression (PHQ2-9): Low Risk  (04/24/2024)  Financial Resource Strain: Low Risk  (01/10/2023)  Physical Activity: Inactive (01/10/2023)  Social Connections: Patient Declined (06/30/2024)  Stress: No Stress Concern Present (01/10/2023)   Recent Concern: Stress - Stress Concern Present (01/10/2023)  Tobacco Use: High Risk (06/30/2024)    Readmission Risk Interventions    06/07/2022    2:19 PM  Readmission Risk Prevention Plan  Transportation Screening Complete  PCP or Specialist Appt within 3-5 Days Complete  HRI or Home Care Consult Complete  Social Work Consult for Recovery Care Planning/Counseling Complete  Palliative Care Screening Not Applicable  Medication Review Oceanographer) Complete

## 2024-07-09 NOTE — Progress Notes (Addendum)
  Progress Note    07/09/2024 9:52 AM 10 Days Post-Op  Subjective:  no complaints   Vitals:   07/09/24 0528 07/09/24 0809  BP: (!) 116/51 (!) 117/55  Pulse: 60 63  Resp: 15 16  Temp: 97.8 F (36.6 C) (!) 97.4 F (36.3 C)  SpO2: 96% 95%   Physical Exam: Lungs:  non labored Incisions:  dressing left in place Extremities:  L foot warm Neurologic: A&O  CBC    Component Value Date/Time   WBC 10.2 07/06/2024 0432   RBC 3.98 (L) 07/06/2024 0432   HGB 11.4 (L) 07/06/2024 0432   HCT 34.7 (L) 07/06/2024 0432   PLT 306 07/06/2024 0432   MCV 87.2 07/06/2024 0432   MCH 28.6 07/06/2024 0432   MCHC 32.9 07/06/2024 0432   RDW 16.0 (H) 07/06/2024 0432   LYMPHSABS 1.7 06/29/2024 1215   MONOABS 0.8 06/29/2024 1215   EOSABS 0.7 (H) 06/29/2024 1215   BASOSABS 0.1 06/29/2024 1215    BMET    Component Value Date/Time   NA 138 07/07/2024 0638   NA 140 04/24/2024 0928   K 4.6 07/07/2024 0638   CL 104 07/07/2024 0638   CO2 23 07/07/2024 0638   GLUCOSE 98 07/07/2024 0638   BUN 24 (H) 07/07/2024 0638   BUN 14 04/24/2024 0928   CREATININE 1.05 07/07/2024 0638   CREATININE 0.81 08/14/2014 1126   CALCIUM  8.9 07/07/2024 0638   GFRNONAA >60 07/07/2024 0638   GFRNONAA >89 08/14/2014 1126   GFRAA 58 (L) 08/05/2020 0524   GFRAA >89 08/14/2014 1126    INR    Component Value Date/Time   INR 1.3 (H) 06/05/2022 1259     Intake/Output Summary (Last 24 hours) at 07/09/2024 9047 Last data filed at 07/09/2024 0900 Gross per 24 hour  Intake 840 ml  Output 1900 ml  Net -1060 ml     Assessment/Plan:  68 y.o. 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  Plan for bedside vac change with WOC RN tomorrow.  TOC team working on placement.   Medically ready for discharge.   Donnice Sender, PA-C [facility] [phone] 07/09/2024 9:52 AM  VASCULAR STAFF ADDENDUM: I have independently interviewed and examined the patient. I agree with the above.  Disposition problem. Homeless with VAC need. Remains medically stable for discharge.  Debby SAILOR. Magda, MD [facility] Phone Number: [phone] 07/09/2024 1:04 PM

## 2024-07-09 NOTE — Plan of Care (Signed)
 Problem: Education: Goal: Knowledge of General Education information will improve Description: Including pain rating scale, medication(s)/side effects and non-pharmacologic comfort measures 07/09/2024 0535 by Myrtie Knee, RN Outcome: Progressing 07/09/2024 0535 by Myrtie Knee, RN Outcome: Progressing   Problem: Health Behavior/Discharge Planning: Goal: Ability to manage health-related needs will improve 07/09/2024 0535 by Myrtie Knee, RN Outcome: Progressing 07/09/2024 0535 by Myrtie Knee, RN Outcome: Progressing   Problem: Clinical Measurements: Goal: Ability to maintain clinical measurements within normal limits will improve 07/09/2024 0535 by Myrtie Knee, RN Outcome: Progressing 07/09/2024 0535 by Myrtie Knee, RN Outcome: Progressing Goal: Will remain free from infection 07/09/2024 0535 by Myrtie Knee, RN Outcome: Progressing 07/09/2024 0535 by Myrtie Knee, RN Outcome: Progressing Goal: Diagnostic test results will improve 07/09/2024 0535 by Myrtie Knee, RN Outcome: Progressing 07/09/2024 0535 by Myrtie Knee, RN Outcome: Progressing Goal: Respiratory complications will improve 07/09/2024 0535 by Myrtie Knee, RN Outcome: Progressing 07/09/2024 0535 by Myrtie Knee, RN Outcome: Progressing Goal: Cardiovascular complication will be avoided 07/09/2024 0535 by Myrtie Knee, RN Outcome: Progressing 07/09/2024 0535 by Myrtie Knee, RN Outcome: Progressing   Problem: Activity: Goal: Risk for activity intolerance will decrease 07/09/2024 0535 by Myrtie Knee, RN Outcome: Progressing 07/09/2024 0535 by Myrtie Knee, RN Outcome: Progressing   Problem: Nutrition: Goal: Adequate nutrition will be maintained 07/09/2024 0535 by Myrtie Knee, RN Outcome: Progressing 07/09/2024 0535 by Myrtie Knee, RN Outcome: Progressing   Problem: Coping: Goal: Level of anxiety will decrease 07/09/2024 0535 by Myrtie Knee, RN Outcome: Progressing 07/09/2024 0535 by Myrtie Knee,  RN Outcome: Progressing   Problem: Elimination: Goal: Will not experience complications related to bowel motility 07/09/2024 0535 by Myrtie Knee, RN Outcome: Progressing 07/09/2024 0535 by Myrtie Knee, RN Outcome: Progressing Goal: Will not experience complications related to urinary retention 07/09/2024 0535 by Myrtie Knee, RN Outcome: Progressing 07/09/2024 0535 by Myrtie Knee, RN Outcome: Progressing   Problem: Pain Managment: Goal: General experience of comfort will improve and/or be controlled 07/09/2024 0535 by Myrtie Knee, RN Outcome: Progressing 07/09/2024 0535 by Myrtie Knee, RN Outcome: Progressing   Problem: Safety: Goal: Ability to remain free from injury will improve 07/09/2024 0535 by Myrtie Knee, RN Outcome: Progressing 07/09/2024 0535 by Myrtie Knee, RN Outcome: Progressing   Problem: Skin Integrity: Goal: Risk for impaired skin integrity will decrease 07/09/2024 0535 by Myrtie Knee, RN Outcome: Progressing 07/09/2024 0535 by Myrtie Knee, RN Outcome: Progressing   Problem: Education: Goal: Ability to describe self-care measures that may prevent or decrease complications (Diabetes Survival Skills Education) will improve 07/09/2024 0535 by Myrtie Knee, RN Outcome: Progressing 07/09/2024 0535 by Myrtie Knee, RN Outcome: Progressing Goal: Individualized Educational Video(s) 07/09/2024 0535 by Myrtie Knee, RN Outcome: Progressing 07/09/2024 0535 by Myrtie Knee, RN Outcome: Progressing   Problem: Coping: Goal: Ability to adjust to condition or change in health will improve 07/09/2024 0535 by Myrtie Knee, RN Outcome: Progressing 07/09/2024 0535 by Myrtie Knee, RN Outcome: Progressing   Problem: Fluid Volume: Goal: Ability to maintain a balanced intake and output will improve 07/09/2024 0535 by Myrtie Knee, RN Outcome: Progressing 07/09/2024 0535 by Myrtie Knee, RN Outcome: Progressing   Problem: Health Behavior/Discharge Planning: Goal: Ability  to identify and utilize available resources and services will improve 07/09/2024 0535 by Myrtie Knee, RN Outcome: Progressing 07/09/2024 0535 by Myrtie Knee, RN Outcome: Progressing Goal: Ability to manage health-related needs will improve 07/09/2024 0535 by Myrtie Knee, RN Outcome: Progressing 07/09/2024 0535 by Myrtie Knee, RN Outcome: Progressing   Problem: Metabolic: Goal: Ability to maintain appropriate glucose  levels will improve 07/09/2024 0535 by Myrtie Knee, RN Outcome: Progressing 07/09/2024 0535 by Myrtie Knee, RN Outcome: Progressing   Problem: Nutritional: Goal: Maintenance of adequate nutrition will improve 07/09/2024 0535 by Myrtie Knee, RN Outcome: Progressing 07/09/2024 0535 by Myrtie Knee, RN Outcome: Progressing Goal: Progress toward achieving an optimal weight will improve Outcome: Progressing   Problem: Skin Integrity: Goal: Risk for impaired skin integrity will decrease Outcome: Progressing   Problem: Tissue Perfusion: Goal: Adequacy of tissue perfusion will improve Outcome: Progressing   Problem: Safety: Goal: Non-violent Restraint(s) Outcome: Progressing

## 2024-07-09 NOTE — Progress Notes (Signed)
 Mobility Specialist Progress Note:    07/09/24 1209  Mobility  Activity Ambulated with assistance  Level of Assistance Contact guard assist, steadying assist  Assistive Device Front wheel walker  Distance Ambulated (ft) 250 ft (x5)  LLE Weight Bearing Per Provider Order WBAT  Activity Response Tolerated well  Mobility Referral Yes  Mobility visit 1 Mobility  Mobility Specialist Start Time (ACUTE ONLY) 1025  Mobility Specialist Stop Time (ACUTE ONLY) 1039  Mobility Specialist Time Calculation (min) (ACUTE ONLY) 14 min   Received pt in bed having no complaints and agreeable to mobility. Took x5 standing rest breaks d/t fatigue, otherwise tolerated well. Returned to room w/o fault. Left in bed w/ call bell in reach and all needs met. Requested pain medicine at [facility], RN aware.   Thersia Minder Mobility Specialist  Please contact vis Secure Chat or  Rehab Office [phone]

## 2024-07-10 ENCOUNTER — Inpatient Hospital Stay (HOSPITAL_COMMUNITY): Payer: MEDICAID

## 2024-07-10 DIAGNOSIS — M7989 Other specified soft tissue disorders: Secondary | ICD-10-CM

## 2024-07-10 LAB — GLUCOSE, CAPILLARY
Glucose-Capillary: 99 mg/dL (ref 70–99)
Glucose-Capillary: 92 mg/dL (ref 70–99)
Glucose-Capillary: 171 mg/dL — ABNORMAL HIGH (ref 70–99)
Glucose-Capillary: 87 mg/dL (ref 70–99)
Glucose-Capillary: 92 mg/dL (ref 70–99)

## 2024-07-10 NOTE — Progress Notes (Signed)
 OT Cancellation Note  Patient Details Name: Robert Sanchez MRN: [id number] DOB: 09-02-1956   Cancelled Treatment:    Reason Eval/Treat Not Completed: Patient at procedure or test/ unavailable (OTF, vascular lab.)  [name] 07/10/2024, 11:47 AM

## 2024-07-10 NOTE — TOC Progression Note (Signed)
 Transition of Care [facility]) - Progression Note    Patient Details  Name: Robert Sanchez MRN: [id number] Date of Birth: 30-Jan-1956  Transition of Care [facility]) CM/SW Contact  Bridget Cordella Simmonds, LCSW Phone Number: 07/10/2024, 2:26 PM  Clinical Narrative:   [facility] [facility]: report from yesterday not accepted for any services.  Pt will need to contact social security office directly to apply for benefits.      Expected Discharge Plan: Home/Self Care Barriers to Discharge: Continued Medical Work up               Expected Discharge Plan and Services In-house Referral: Clinical Social Work     Living arrangements for the past 2 months: Apartment Expected Discharge Date: 06/30/24               DME Arranged: [place] DME Agency: Other - Comment [place]) Date DME Agency Contacted: 07/10/24 Time DME Agency Contacted: [phone] Representative spoke with at DME Agency: Randine             Social Drivers of Health (SDOH) Interventions SDOH Screenings   Food Insecurity: No Food Insecurity (06/30/2024)  Housing: Low Risk  (06/30/2024)  Transportation Needs: No Transportation Needs (06/30/2024)  Utilities: Not At Risk (06/30/2024)  Alcohol Screen: Low Risk  (01/10/2023)  Depression (PHQ2-9): Low Risk  (04/24/2024)  Financial Resource Strain: Low Risk  (01/10/2023)  Physical Activity: Inactive (01/10/2023)  Social Connections: Patient Declined (06/30/2024)  Stress: No Stress Concern Present (01/10/2023)  Recent Concern: Stress - Stress Concern Present (01/10/2023)  Tobacco Use: High Risk (06/30/2024)    Readmission Risk Interventions    06/07/2022    2:19 PM  Readmission Risk Prevention Plan  Transportation Screening Complete  PCP or Specialist Appt within 3-5 Days Complete  HRI or Home Care Consult Complete  Social Work Consult for Recovery Care Planning/Counseling Complete  Palliative Care Screening Not Applicable  Medication Review Oceanographer) Complete

## 2024-07-10 NOTE — TOC Progression Note (Addendum)
 Transition of Care [facility]) - Progression Note    Patient Details  Name: Robert Sanchez MRN: [id number] Date of Birth: Nov 03, 1955  Transition of Care [facility]) CM/SW Contact  Rosalva Jon Bloch, RN Phone Number: 07/10/2024, 1:17 PM  Clinical Narrative:    Pt with home wound vac need.Pt without insurance. Medicaid pending. Referral made with Randine [phone]) @ [employer] for charity care wound vac. Charity care application completed by pt and emailed to [place], approval pending. Referral made with Arna with [facility] for [facility] ( charity care) , acceptance pending. [facility] ( Inpatient Care Management) following and will assist with needs....  Expected Discharge Plan: Home/Self Care Barriers to Discharge: Continued Medical Work up               Expected Discharge Plan and Services In-house Referral: Clinical Social Work     Living arrangements for the past 2 months: Apartment Expected Discharge Date: 06/30/24               DME Arranged: [place] DME Agency: Other - Comment [place]) Date DME Agency Contacted: 07/10/24 Time DME Agency Contacted: [phone] Representative spoke with at DME Agency: Randine             Social Drivers of Health (SDOH) Interventions SDOH Screenings   Food Insecurity: No Food Insecurity (06/30/2024)  Housing: Low Risk  (06/30/2024)  Transportation Needs: No Transportation Needs (06/30/2024)  Utilities: Not At Risk (06/30/2024)  Alcohol Screen: Low Risk  (01/10/2023)  Depression (PHQ2-9): Low Risk  (04/24/2024)  Financial Resource Strain: Low Risk  (01/10/2023)  Physical Activity: Inactive (01/10/2023)  Social Connections: Patient Declined (06/30/2024)  Stress: No Stress Concern Present (01/10/2023)  Recent Concern: Stress - Stress Concern Present (01/10/2023)  Tobacco Use: High Risk (06/30/2024)    Readmission Risk Interventions    06/07/2022    2:19 PM  Readmission Risk Prevention Plan  Transportation Screening Complete  PCP or Specialist  Appt within 3-5 Days Complete  HRI or Home Care Consult Complete  Social Work Consult for Recovery Care Planning/Counseling Complete  Palliative Care Screening Not Applicable  Medication Review Oceanographer) Complete

## 2024-07-10 NOTE — Plan of Care (Signed)
  Problem: Pain Managment: Goal: General experience of comfort will improve and/or be controlled Outcome: Progressing   Problem: Safety: Goal: Ability to remain free from injury will improve Outcome: Progressing

## 2024-07-10 NOTE — Consult Note (Signed)
 WOC Nurse Consult Note: Reason for Consult: Foam exchange NPWT dressing LLE Wound type: two surgical open sites Measurement: proximal wound 5 cm x 3 cm x 0.75 D Distal wound 8 cm x 8 cm x 0.5 cm D Wound bed: red,moist, some non-viable tissue noted at base, no malodor, more shallow when compared to prior image Proximal with exposed vessel and muscle; silicone contact layer applied over vessel to upper wound Distal with exposed vessels and muscle Drainage serosanguinous in canister Periwound: intact, no erythema, no macerated tissue Dressing procedure/placement/frequency:twice a week change. Removed black foams cleansed with saline gauze, image taken Protected skin between the two wounds with strip of VAC drape. 2 pc of black foam used as a bridge between wounds 1pc of black foam used for each of the open wounds (2) total. Resumed neg continuous pressure @ .  Label applied to tubing, patient requested ACE bandage (2) wrapped around LLE. Patient did not want primary nurse to get pain meds for him prior to treatment. Patient stated just go ahead, its fine. Patient stated the dressing felt more comfortable. Coordinated care with primary nurse.    WOC will follow along for Q Tuesday/Friday NPWT dressing changes.  LLE 07/10/24    Please reconsult if wound worsens in condition and notify provider. Nursing staff to follow policy and procedure regarding VAC therapy.  Sherrilyn Hals MSN RN CWOCN WOC [facility]  [phone] (Available from 7-3 pm Mon-Friday)

## 2024-07-10 NOTE — Progress Notes (Signed)
 LLE venous duplex has been completed.   Results can be found under chart review under CV PROC. 07/10/2024 12:58 PM Steele Stracener RVT, RDMS

## 2024-07-10 NOTE — Progress Notes (Signed)
 [facility] OF [place] PROGRESS NOTE  ASSESSMENT / PLAN: Robert Sanchez is a 69 y.o. 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 Complaints of left leg swelling today. Will check venous duplex. Continue VAC changes. Continues to be medically stable for discharge.   SUBJECTIVE: Left leg swollen. No other complaints.  OBJECTIVE: BP (!) 148/57 (BP Location: Left Arm)   Pulse 61   Temp 98.5 F (36.9 C) (Oral)   Resp 18   Ht 5' 8 (1.727 m)   Wt 96.7 kg   SpO2 97%   BMI 32.41 kg/m   Intake/Output Summary (Last 24 hours) at 07/10/2024 0821 Last data filed at 07/09/2024 1700 Gross per 24 hour  Intake 960 ml  Output 1150 ml  Net -190 ml    No acute distress Regular rate and rhythm Unlabored breathing Left leg mildly edematous VAC in place with good seal     Latest Ref Rng & Units 07/06/2024    4:32 AM 06/30/2024    7:21 AM 06/29/2024   12:44 PM  CBC  WBC 4.0 - 10.5 K/uL 10.2  11.3    Hemoglobin 13.0 - 17.0 g/dL 88.5  86.5  89.0   Hematocrit 39.0 - 52.0 % 34.7  41.4  32.0   Platelets 150 - 400 K/uL 306  311          Latest Ref Rng & Units 07/07/2024    6:38 AM 07/06/2024    4:32 AM 07/05/2024    4:10 AM  CMP  Glucose 70 - 99 mg/dL 98  878  891   BUN 8 - 23 mg/dL 24  30  40   Creatinine 0.61 - 1.24 mg/dL 8.94  8.75  8.44   Sodium 135 - 145 mmol/L 138  135  132   Potassium 3.5 - 5.1 mmol/L 4.6  4.6  4.4   Chloride 98 - 111 mmol/L 104  107  100   CO2 22 - 32 mmol/L 23  21  20    Calcium  8.9 - 10.3 mg/dL 8.9  8.6  8.3     Estimated Creatinine Clearance: 78 mL/min (by C-G formula based on SCr of 1.05 mg/dL).  Debby SAILOR. Magda, MD [facility] of [facility] Phone Number: [phone] 07/10/2024 8:21 AM

## 2024-07-11 LAB — GLUCOSE, CAPILLARY
Glucose-Capillary: 96 mg/dL (ref 70–99)
Glucose-Capillary: 121 mg/dL — ABNORMAL HIGH (ref 70–99)
Glucose-Capillary: 111 mg/dL — ABNORMAL HIGH (ref 70–99)
Glucose-Capillary: 88 mg/dL (ref 70–99)

## 2024-07-11 NOTE — TOC Progression Note (Signed)
 Transition of Care [facility]) - Progression Note    Patient Details  Name: Robert Sanchez MRN: [id number] Date of Birth: [birth year]  Transition of Care [facility]) CM/SW Contact  Rosalva Jon Bloch, RN Phone Number: 07/11/2024, 11:29 AM  Clinical Narrative:    Approval received for wound vac, NCM made MD aware. Inpatient Care Management  (ICM)following and will continue assisting with needs...  Expected Discharge Plan: Home/Self Care Barriers to Discharge: Continued Medical Work up               Expected Discharge Plan and Services In-house Referral: Clinical Social Work     Living arrangements for the past 2 months: Apartment Expected Discharge Date: 06/30/24               DME Arranged: [place] DME Agency: Other - Comment [place]) Date DME Agency Contacted: 07/10/24 Time DME Agency Contacted: [phone] Representative spoke with at DME Agency: Randine             Social Drivers of Health (SDOH) Interventions SDOH Screenings   Food Insecurity: No Food Insecurity (06/30/2024)  Housing: Low Risk  (06/30/2024)  Transportation Needs: No Transportation Needs (06/30/2024)  Utilities: Not At Risk (06/30/2024)  Alcohol Screen: Low Risk  (01/10/2023)  Depression (PHQ2-9): Low Risk  (04/24/2024)  Financial Resource Strain: Low Risk  (01/10/2023)  Physical Activity: Inactive (01/10/2023)  Social Connections: Patient Declined (06/30/2024)  Stress: No Stress Concern Present (01/10/2023)  Recent Concern: Stress - Stress Concern Present (01/10/2023)  Tobacco Use: High Risk (06/30/2024)    Readmission Risk Interventions    06/07/2022    2:19 PM  Readmission Risk Prevention Plan  Transportation Screening Complete  PCP or Specialist Appt within 3-5 Days Complete  HRI or Home Care Consult Complete  Social Work Consult for Recovery Care Planning/Counseling Complete  Palliative Care Screening Not Applicable  Medication Review Oceanographer) Complete

## 2024-07-11 NOTE — Progress Notes (Signed)
 Mobility Specialist Progress Note:    07/11/24 1359  Mobility  Activity Ambulated with assistance (In hallway)  Level of Assistance Standby assist, set-up cues, supervision of patient - no hands on  Assistive Device Front wheel walker  Distance Ambulated (ft) 200 ft (x5)  LLE Weight Bearing Per Provider Order WBAT  Activity Response Tolerated well  Mobility Referral Yes  Mobility visit 1 Mobility  Mobility Specialist Start Time (ACUTE ONLY) 1234  Mobility Specialist Stop Time (ACUTE ONLY) 1248  Mobility Specialist Time Calculation (min) (ACUTE ONLY) 14 min   Received pt EOB and agreeable to mobility. No physical assistance needed. C/o LLE discomfort, otherwise tolerated well. Returned to room without fault. Left pt in bed. Personal belongings and call light within reach. All needs met.  Lavanda Pollack Mobility Specialist  Please contact via [employer] or  Rehab Office [phone]

## 2024-07-11 NOTE — Progress Notes (Signed)
 Physical Therapy Treatment Patient Details Name: Robert Sanchez MRN: [id number] DOB: Oct 01, 1956 Today's Date: 07/11/2024   History of Present Illness Robert Sanchez  68 y.o. male who presented with a LLE wound from a boat propeller. 8/29 washout of wounds and partial excision of prosthetic bypass. PMH includes tobacco use, COPD, DM, neuropathy, CAD, HLD, MI, PVD, arthritis, HOH.    PT Comments  Today's session focused on the pt's ability to maintain walking balance while responding to different task demands, through various dynamic conditions. He completed the DGI and scored 18/24, which is just below the cut-off for increased fall risk (<19/24). Pt requires use of RW for improved stability and safety awareness. He demonstrated advanced functional mobility with decreased physical assistance. Overall, pt was supervision to modI. He appears to be close to his baseline function, no follow-up PT needs. Will continue to follow acutely and advance appropriately.      If plan is discharge home, recommend the following: A little help with walking and/or transfers;A little help with bathing/dressing/bathroom;Assistance with cooking/housework;Assist for transportation;Help with stairs or ramp for entrance   Can travel by private vehicle        Equipment Recommendations  Rolling walker (2 wheels)    Recommendations for Other Services       Precautions / Restrictions Precautions Precautions: Fall Recall of Precautions/Restrictions: Intact Precaution/Restrictions Comments: Wound Vac Restrictions Weight Bearing Restrictions Per Provider Order: Yes LLE Weight Bearing Per Provider Order: Weight bearing as tolerated     Mobility  Bed Mobility Overal bed mobility: Modified Independent             General bed mobility comments: Pt performed supine<>sit with increased time and HOB elevated.    Transfers Overall transfer level: Modified independent Equipment used: Rolling walker (2  wheels) Transfers: Sit to/from Stand             General transfer comment: Pt performed sit<>stand from lowest bed height. He demonstrated proper hand placement using RW. Powered up without physical assist. Good eccentric control with sitting.    Ambulation/Gait Ambulation/Gait assistance: Supervision Gait Distance (Feet): 1250 Feet Assistive device: Rolling walker (2 wheels) Gait Pattern/deviations: Step-through pattern, Decreased stride length Gait velocity: reduced Gait velocity interpretation: 1.31 - 2.62 ft/sec, indicative of limited community ambulator   General Gait Details: Pt ambulated with a reciprocal gait pattern, even weight shift, and good foot clearence. PT held wound vac throughout mobility. He stayed within RW and had no overt LOB. Pt unsteady without AD.   Stairs Stairs: Yes Stairs assistance: Supervision Stair Management: Two rails, Forwards, Step to pattern Number of Stairs: 5 (x2) General stair comments: Pt ascended leading with RLE and descended leading with LLE. He maintained BUE support on railings. PT managed wound vac. No overt LOB.   Wheelchair Mobility     Tilt Bed    Modified Rankin (Stroke Patients Only)       Balance                                 Standardized Balance Assessment Standardized Balance Assessment : Dynamic Gait Index   Dynamic Gait Index Level Surface: Mild Impairment Change in Gait Speed: Mild Impairment Gait with Horizontal Head Turns: Mild Impairment Gait with Vertical Head Turns: Mild Impairment Gait and Pivot Turn: Normal Step Over Obstacle: Normal Step Around Obstacles: Normal Steps: Moderate Impairment Total Score: 18      Communication Communication  Communication: Impaired Factors Affecting Communication: Hearing impaired  Cognition Arousal: Alert Behavior During Therapy: WFL for tasks assessed/performed   PT - Cognitive impairments: No family/caregiver present to determine baseline                          Following commands: Intact      Cueing Cueing Techniques: Verbal cues  Exercises      General Comments General comments (skin integrity, edema, etc.): VSS on RA      Pertinent Vitals/Pain Pain Assessment Pain Assessment: No/denies pain    Home Living                          Prior Function            PT Goals (current goals can now be found in the care plan section) Acute Rehab PT Goals Patient Stated Goal: Regain independence and not need an AD Progress towards PT goals: Progressing toward goals    Frequency    Min 1X/week      PT Plan      Co-evaluation              AM-PAC PT 6 Clicks Mobility   Outcome Measure  Help needed turning from your back to your side while in a flat bed without using bedrails?: None Help needed moving from lying on your back to sitting on the side of a flat bed without using bedrails?: None Help needed moving to and from a bed to a chair (including a wheelchair)?: None Help needed standing up from a chair using your arms (e.g., wheelchair or bedside chair)?: None Help needed to walk in hospital room?: A Little Help needed climbing 3-5 steps with a railing? : A Little 6 Click Score: 22    End of Session Equipment Utilized During Treatment: Gait belt Activity Tolerance: Patient tolerated treatment well Patient left: in bed;with call bell/phone within reach Nurse Communication: Mobility status PT Visit Diagnosis: Difficulty in walking, not elsewhere classified (R26.2);Unsteadiness on feet (R26.81)     Time: 8448-8394 PT Time Calculation (min) (ACUTE ONLY): 14 min  Charges:      PT General Charges $$ ACUTE PT VISIT: 1 Visit                     Randall SAUNDERS, PT, DPT Acute Rehabilitation Services Office: [phone] Secure Chat Preferred  [name] 07/11/2024, 4:47 PM

## 2024-07-11 NOTE — Progress Notes (Addendum)
  Progress Note    07/11/2024 8:37 AM 12 Days Post-Op  Subjective:  no complaints   Vitals:   07/11/24 0344 07/11/24 0751  BP: (!) 128/55 (!) 129/56  Pulse: 75 64  Resp:  15  Temp: 97.8 F (36.6 C) 97.6 F (36.4 C)  SpO2: 95% 96%   Physical Exam: Lungs:  non labored Incisions:  LLE dressing left in place Extremities:  L foot warm Neurologic: A&O  CBC    Component Value Date/Time   WBC 10.2 07/06/2024 0432   RBC 3.98 (L) 07/06/2024 0432   HGB 11.4 (L) 07/06/2024 0432   HCT 34.7 (L) 07/06/2024 0432   PLT 306 07/06/2024 0432   MCV 87.2 07/06/2024 0432   MCH 28.6 07/06/2024 0432   MCHC 32.9 07/06/2024 0432   RDW 16.0 (H) 07/06/2024 0432   LYMPHSABS 1.7 06/29/2024 1215   MONOABS 0.8 06/29/2024 1215   EOSABS 0.7 (H) 06/29/2024 1215   BASOSABS 0.1 06/29/2024 1215    BMET    Component Value Date/Time   NA 138 07/07/2024 0638   NA 140 04/24/2024 0928   K 4.6 07/07/2024 0638   CL 104 07/07/2024 0638   CO2 23 07/07/2024 0638   GLUCOSE 98 07/07/2024 0638   BUN 24 (H) 07/07/2024 0638   BUN 14 04/24/2024 0928   CREATININE 1.05 07/07/2024 0638   CREATININE 0.81 08/14/2014 1126   CALCIUM  8.9 07/07/2024 0638   GFRNONAA >60 07/07/2024 0638   GFRNONAA >89 08/14/2014 1126   GFRAA 58 (L) 08/05/2020 0524   GFRAA >89 08/14/2014 1126    INR    Component Value Date/Time   INR 1.3 (H) 06/05/2022 1259     Intake/Output Summary (Last 24 hours) at 07/11/2024 0837 Last data filed at 07/11/2024 0400 Gross per 24 hour  Intake 240 ml  Output 1250 ml  Net -1010 ml     Assessment/Plan:  68 y.o. male is s/p debridement of left leg after trauma 12 Days Post-Op   LLE venous duplex negative for DVT WOC RN changed vac yesterday; next change on Friday Ready for discharge when placement secured   Donnice Sender, PA-C [facility] [phone] 07/11/2024 8:37 AM  VASCULAR STAFF ADDENDUM: I have independently interviewed and examined the patient. I  agree with the above.   Debby SAILOR. Magda, MD [facility] Phone Number: [phone] 07/11/2024 2:31 PM

## 2024-07-12 ENCOUNTER — Other Ambulatory Visit (HOSPITAL_BASED_OUTPATIENT_CLINIC_OR_DEPARTMENT_OTHER): Payer: Self-pay

## 2024-07-12 ENCOUNTER — Other Ambulatory Visit: Payer: Self-pay

## 2024-07-12 LAB — GLUCOSE, CAPILLARY
Glucose-Capillary: 92 mg/dL (ref 70–99)
Glucose-Capillary: 175 mg/dL — ABNORMAL HIGH (ref 70–99)
Glucose-Capillary: 112 mg/dL — ABNORMAL HIGH (ref 70–99)
Glucose-Capillary: 111 mg/dL — ABNORMAL HIGH (ref 70–99)
Glucose-Capillary: 91 mg/dL (ref 70–99)

## 2024-07-12 NOTE — Progress Notes (Signed)
  Progress Note    07/12/2024 11:11 AM 13 Days Post-Op  Subjective:  no complaints.  Wish everyone would quit telling him that smoking causes problems.  Wound vac alarming.  He says he is going to a friend's place to stay when he leaves.   afebrile  Vitals:   07/11/24 1900 07/12/24 0400  BP: (!) 121/57 124/66  Pulse: 73 75  Resp:  16  Temp: 98.4 F (36.9 C) 97.9 F (36.6 C)  SpO2: 96% 96%    Physical Exam: General:  no distress Lungs:  non labored Incisions:    Right lateral leg    Right lateral calf    CBC    Component Value Date/Time   WBC 10.2 07/06/2024 0432   RBC 3.98 (L) 07/06/2024 0432   HGB 11.4 (L) 07/06/2024 0432   HCT 34.7 (L) 07/06/2024 0432   PLT 306 07/06/2024 0432   MCV 87.2 07/06/2024 0432   MCH 28.6 07/06/2024 0432   MCHC 32.9 07/06/2024 0432   RDW 16.0 (H) 07/06/2024 0432   LYMPHSABS 1.7 06/29/2024 1215   MONOABS 0.8 06/29/2024 1215   EOSABS 0.7 (H) 06/29/2024 1215   BASOSABS 0.1 06/29/2024 1215    BMET    Component Value Date/Time   NA 138 07/07/2024 0638   NA 140 04/24/2024 0928   K 4.6 07/07/2024 0638   CL 104 07/07/2024 0638   CO2 23 07/07/2024 0638   GLUCOSE 98 07/07/2024 0638   BUN 24 (H) 07/07/2024 0638   BUN 14 04/24/2024 0928   CREATININE 1.05 07/07/2024 0638   CREATININE 0.81 08/14/2014 1126   CALCIUM  8.9 07/07/2024 0638   GFRNONAA >60 07/07/2024 0638   GFRNONAA >89 08/14/2014 1126   GFRAA 58 (L) 08/05/2020 0524   GFRAA >89 08/14/2014 1126    INR    Component Value Date/Time   INR 1.3 (H) 06/05/2022 1259     Intake/Output Summary (Last 24 hours) at 07/12/2024 1111 Last data filed at 07/12/2024 0700 Gross per 24 hour  Intake 720 ml  Output 670 ml  Net 50 ml      Assessment/Plan:  68 y.o. male is s/p:  debridement of left leg thrombosed prosthetic bypass 06/29/24 after traumatic injury.   13 Days Post-Op   -wound vac without seal-changed and pictured as above.  Used y connector and changed canister.   -home once he has a place to go-says he is going to a friend's place upon discharge tomorrow.    Lucie Apt, PA-C [facility] [phone] 07/12/2024 11:11 AM

## 2024-07-12 NOTE — Plan of Care (Signed)

## 2024-07-12 NOTE — Consult Note (Signed)
 WOC followup: Vascular team changed Vac dressing today; refer to their progress notes for assessment and plan of care.  Next Vac change will be performed by [facility] team on Mon if patient is still in the hospital at that time.     Thank-you,  Stephane Fought MSN, RN, CWOCN, CWCN-AP, CNS Contact Mon-Fri 0700-1500: [phone]

## 2024-07-12 NOTE — Progress Notes (Signed)
 Mobility Specialist Progress Note:    07/12/24 1112  Mobility  Activity Ambulated with assistance (In hallway)  Level of Assistance Standby assist, set-up cues, supervision of patient - no hands on  Assistive Device Front wheel walker  Distance Ambulated (ft) 250 ft (x5)  LLE Weight Bearing Per Provider Order WBAT  Activity Response Tolerated well  Mobility Referral Yes  Mobility visit 1 Mobility  Mobility Specialist Start Time (ACUTE ONLY) [id number]  Mobility Specialist Stop Time (ACUTE ONLY) 1005  Mobility Specialist Time Calculation (min) (ACUTE ONLY) 22 min   Received pt in bed and agreeable to mobility. No physical assistance needed. C/o LLE discomfort, otherwise tolerated well. Returned to room without fault. Left pt in bed. Personal belongings and call light within reach. All needs met.  Lavanda Pollack Mobility Specialist  Please contact via [employer] or  Rehab Office [phone]

## 2024-07-13 ENCOUNTER — Other Ambulatory Visit: Payer: Self-pay

## 2024-07-13 LAB — GLUCOSE, CAPILLARY: Glucose-Capillary: 112 mg/dL — ABNORMAL HIGH (ref 70–99)

## 2024-07-13 NOTE — Progress Notes (Addendum)
  Progress Note    07/13/2024 7:53 AM 14 Days Post-Op  Subjective:  no complaints   Vitals:   07/12/24 2038 07/13/24 0444  BP: (!) 124/49 (!) 146/60  Pulse: 63 73  Resp: 16 16  Temp: (!) 97.5 F (36.4 C) (!) 97.5 F (36.4 C)  SpO2: 94% 96%   Physical Exam: Lungs:  non labored Incisions:  L leg incisions with wound vac Extremities:  feet warm and well perfused Neurologic: A&O  CBC    Component Value Date/Time   WBC 10.2 07/06/2024 0432   RBC 3.98 (L) 07/06/2024 0432   HGB 11.4 (L) 07/06/2024 0432   HCT 34.7 (L) 07/06/2024 0432   PLT 306 07/06/2024 0432   MCV 87.2 07/06/2024 0432   MCH 28.6 07/06/2024 0432   MCHC 32.9 07/06/2024 0432   RDW 16.0 (H) 07/06/2024 0432   LYMPHSABS 1.7 06/29/2024 1215   MONOABS 0.8 06/29/2024 1215   EOSABS 0.7 (H) 06/29/2024 1215   BASOSABS 0.1 06/29/2024 1215    BMET    Component Value Date/Time   NA 138 07/07/2024 0638   NA 140 04/24/2024 0928   K 4.6 07/07/2024 0638   CL 104 07/07/2024 0638   CO2 23 07/07/2024 0638   GLUCOSE 98 07/07/2024 0638   BUN 24 (H) 07/07/2024 0638   BUN 14 04/24/2024 0928   CREATININE 1.05 07/07/2024 0638   CREATININE 0.81 08/14/2014 1126   CALCIUM  8.9 07/07/2024 0638   GFRNONAA >60 07/07/2024 0638   GFRNONAA >89 08/14/2014 1126   GFRAA 58 (L) 08/05/2020 0524   GFRAA >89 08/14/2014 1126    INR    Component Value Date/Time   INR 1.3 (H) 06/05/2022 1259     Intake/Output Summary (Last 24 hours) at 07/13/2024 0753 Last data filed at 07/12/2024 2030 Gross per 24 hour  Intake 480 ml  Output 700 ml  Net -220 ml     Assessment/Plan:  68 y.o. male is s/p debridement of left leg thrombosed prosthetic bypass 06/29/24 after traumatic injury.  14 Days Post-Op   Wound vacs with good seal Plan is for discharge home today with his friend once home vac has been delivered Office will arrange follow up   Donnice Sender, PA-C [facility] [phone] 07/13/2024 7:53  AM  VASCULAR STAFF ADDENDUM: I have independently interviewed and examined the patient. I agree with the above.   Debby SAILOR. Magda, MD [facility] Phone Number: [phone] 07/13/2024 9:50 AM

## 2024-07-13 NOTE — Progress Notes (Signed)
 Occupational Therapy Treatment Patient Details Name: Robert Sanchez MRN: [id number] DOB: 03/10/1956 Today's Date: 07/13/2024   History of present illness Robert Sanchez  68 y.o. male who presented with a LLE wound from a boat propeller. 8/29 washout of wounds and partial excision of prosthetic bypass. PMH includes tobacco use, COPD, DM, neuropathy, CAD, HLD, MI, PVD, arthritis, HOH.   OT comments  Pt. Seen for skilled OT treatment session.  Pt. Reports majority of bathing completed prior to my arrival.  Pt. Able to complete LB bathing and dressing with set up/S sit/stand with no lob or safety concerns noted.  Reports he has been through skilled therapies many times with many previous sx. And he has no questions or concerns for management and completion of ADLs. Note d/c likely later today.        If plan is discharge home, recommend the following:  A little help with walking and/or transfers;A little help with bathing/dressing/bathroom;Assistance with cooking/housework;Help with stairs or ramp for entrance;Assist for transportation   Equipment Recommendations       Recommendations for Other Services      Precautions / Restrictions Precautions Precautions: Fall Recall of Precautions/Restrictions: Intact Precaution/Restrictions Comments: Wound Vac Restrictions LLE Weight Bearing Per Provider Order: Weight bearing as tolerated       Mobility Bed Mobility               General bed mobility comments: seated eob at beg and end of session    Transfers                         Balance                                           ADL either performed or assessed with clinical judgement   ADL Overall ADL's : Needs assistance/impaired             Lower Body Bathing: Set up;Sitting/lateral leans Lower Body Bathing Details (indicate cue type and reason): figure 4 to reach feet for washing     Lower Body Dressing: Supervision/safety;Sit  to/from stand;Sitting/lateral leans Lower Body Dressing Details (indicate cue type and reason): seated figure four to don pants, S sit/stand to pull up                    Extremity/Trunk Assessment              Vision       Perception     Praxis     Communication     Cognition Arousal: Alert Behavior During Therapy: WFL for tasks assessed/performed Cognition: No apparent impairments                                        Cueing      Exercises      Shoulder Instructions       General Comments  Retired Nutritional therapist    Pertinent Vitals/ Pain       Pain Assessment Pain Assessment: No/denies pain  Home Living  Prior Functioning/Environment              Frequency  Min 2X/week        Progress Toward Goals  OT Goals(current goals Sanchez now be found in the care plan section)  Progress towards OT goals: Progressing toward goals     Plan      Co-evaluation                 AM-PAC OT 6 Clicks Daily Activity     Outcome Measure   Help from another person eating meals?: None Help from another person taking care of personal grooming?: A Little Help from another person toileting, which includes using toliet, bedpan, or urinal?: A Little Help from another person bathing (including washing, rinsing, drying)?: A Little Help from another person to put on and taking off regular upper body clothing?: A Little Help from another person to put on and taking off regular lower body clothing?: A Little 6 Click Score: 19    End of Session    OT Visit Diagnosis: Unsteadiness on feet (R26.81);Other abnormalities of gait and mobility (R26.89);Muscle weakness (generalized) (M62.81);Pain   Activity Tolerance Patient tolerated treatment well   Patient Left Other (comment);with call bell/phone within reach (seated eob with staff member in the room with him)   Nurse Communication  Other (comment) (rn states ok to work with pt., reviewed with rn pt. had taken apart wound vac for lb b/d and reattached it after pants on)        Time: 9040-8985 OT Time Calculation (min): 15 min  Charges: OT General Charges $OT Visit: 1 Visit OT Treatments $Self Care/Home Management : 8-22 mins  Randall, COTA/L Acute Rehabilitation [phone]   CHRISTELLA Nest Lorraine-COTA/L  07/13/2024, 10:25 AM

## 2024-07-13 NOTE — TOC Progression Note (Addendum)
 Transition of Care [facility]) - Progression Note    Patient Details  Name: Robert Sanchez MRN: [id number] Date of Birth: 11-24-1955  [facility] CM/SW Contact  Rosalva Jon Bloch, RN Phone Number: 07/13/2024, 10:25 AM  Clinical Narrative:    -  s/p debridement of left leg thrombosed prosthetic bypass 06/29/24  Wound vac delivered to pt's bedside. Vac delivery form signed by pt. Pt will transition to a friend's place of residence. Per MD d/c plan today. Provider to manage drsg changes in office. TOC pharmacy to provide RX med for pt pickup prior to d/c. Pt without RX med concerns. Post hospital f/u noted on AVS. Friend to provide transportation to home.  Inpatient Care Management will continue to monitor until d/c.  07/13/2024 @ 1434, [name] Brazen (Friend)Emergency Contact [phone], [phone]   Expected Discharge Plan: Home/Self Care Barriers to Discharge: Continued Medical Work up               Expected Discharge Plan and Services In-house Referral: Clinical Social Work     Living arrangements for the past 2 months: Apartment Expected Discharge Date: 06/30/24               DME Arranged: [place] DME Agency: Other - Comment [place]) Date DME Agency Contacted: 07/10/24 Time DME Agency Contacted: [phone] Representative spoke with at DME Agency: Randine             Social Drivers of Health (SDOH) Interventions SDOH Screenings   Food Insecurity: No Food Insecurity (06/30/2024)  Housing: Low Risk  (06/30/2024)  Transportation Needs: No Transportation Needs (06/30/2024)  Utilities: Not At Risk (06/30/2024)  Alcohol Screen: Low Risk  (01/10/2023)  Depression (PHQ2-9): Low Risk  (04/24/2024)  Financial Resource Strain: Low Risk  (01/10/2023)  Physical Activity: Inactive (01/10/2023)  Social Connections: Patient Declined (06/30/2024)  Stress: No Stress Concern Present (01/10/2023)  Recent Concern: Stress - Stress Concern Present (01/10/2023)   Tobacco Use: High Risk (06/30/2024)    Readmission Risk Interventions    06/07/2022    2:19 PM  Readmission Risk Prevention Plan  Transportation Screening Complete  PCP or Specialist Appt within 3-5 Days Complete  HRI or Home Care Consult Complete  Social Work Consult for Recovery Care Planning/Counseling Complete  Palliative Care Screening Not Applicable  Medication Review Oceanographer) Complete

## 2024-07-14 NOTE — Discharge Summary (Signed)
 Discharge Summary  Patient ID: Robert Sanchez [id number] 68 y.o. 1956-10-28  Admit date: 06/29/2024  Discharge date and time: 07/13/2024 12:50 PM   Admitting Physician: Debby LOISE Robertson, MD   Discharge Physician: same  Admission Diagnoses: Laceration of multiple sites of left lower extremity, initial encounter [S81.812A] Status post surgery [Z98.890] Injury of left lower extremity [S89.92XA]  Discharge Diagnoses: same  Admission Condition: good  Discharged Condition: good  Indication for Admission: post op care; homeless  Hospital Course: Robert Sanchez is a 68 year old male who sustained an injury to his left leg and was brought to the operating room for partial excision of left leg bypass and wound VAC placement by Dr. Robertson on 06/29/2024.  He had a prolonged hospital stay due to being homeless and uninsured.  He received wound VAC changes twice weekly.  At the time of discharge a charity home VAC was arranged.  Plan will be to change his wound VAC on a weekly basis in our office.  He was prescribed 2 to 3 days of narcotic pain medication for continued postoperative pain control.  He was discharged home in stable condition.  Consults: None  Treatments: surgery:  1) partial excision of left common femoral - anterior tibial artery bypass (CPT [phone]) 2) sharp excisional debridement of left lower extremity (CPT 11043) 3) initiation of wound vac therapy to wounds of left lower extremity (CPT 02394) By Dr. Robertson on 06/29/24  Discharge Exam: See progress note 07/13/24 Vitals:   07/13/24 0444 07/13/24 0758  BP: (!) 146/60 131/66  Pulse: 73 67  Resp: 16 16  Temp: (!) 97.5 F (36.4 C) 98.1 F (36.7 C)  SpO2: 96% 99%     Disposition: Discharge disposition: 01-Home or Self Care       Patient Instructions:  Allergies as of 07/13/2024       Reactions   Lisinopril  Other (See Comments)   Syncope   Codeine Rash        Medication List     TAKE these medications     albuterol  108 (90 Base) MCG/ACT inhaler Commonly known as: VENTOLIN  HFA Inhale 2 puffs into the lungs every 6 hours as needed for wheezing or shortness of breath.   aspirin  EC 81 MG tablet Take 1 tablet (81 mg total) by mouth daily. Swallow whole.   atorvastatin  80 MG tablet Commonly known as: LIPITOR  Take 1 tablet (80 mg total) by mouth at bedtime.   Basaglar  KwikPen 100 UNIT/ML Inject 42 Units into the skin daily.   carvedilol  6.25 MG tablet Commonly known as: COREG  Take 1 tablet (6.25 mg total) by mouth 2 (two) times daily with a meal.   furosemide  40 MG tablet Commonly known as: LASIX  Take 1 tablet (40 mg total) by mouth daily.   gabapentin  300 MG capsule Commonly known as: NEURONTIN  Take 2 capsules (600 mg total) by mouth at bedtime.   HumaLOG  KwikPen 100 UNIT/ML KwikPen Generic drug: insulin  lispro Inject 5 Units into the skin daily before supper.   losartan  25 MG tablet Commonly known as: COZAAR  Take 0.5 tablets (12.5 mg total) by mouth at bedtime.   metFORMIN  500 MG tablet Commonly known as: GLUCOPHAGE  Take 2 tablets (1,000 mg total) by mouth 2 (two) times daily with a meal.   multivitamin tablet Take 1 tablet by mouth daily.   OneTouch Delica Plus Lancet33G Misc Use to test blood sugars up to 4 times daily as directed.   OneTouch Verio test strip Generic drug: glucose blood Use  to test blood sugars up to 4 times daily as directed.   OneTouch Verio w/Device Kit Use to test blood sugars up to 4 times daily as directed.   oxyCODONE  5 MG immediate release tablet Commonly known as: Oxy IR/ROXICODONE  Take 1 tablet (5 mg total) by mouth every 6 (six) hours as needed for severe pain (pain score 7-10).   potassium chloride  10 MEQ tablet Commonly known as: KLOR-CON  Take 1 tablet (10 mEq total) by mouth daily.   spironolactone  25 MG tablet Commonly known as: ALDACTONE  Take 0.5 tablets (12.5 mg total) by mouth daily.   TRUEplus 5-Bevel Pen Needles 32G X  4 MM Misc Generic drug: Insulin  Pen Needle Use as directed with insulin  pen   TRUEplus Insulin  Syringe 30G X 5/16 1 ML Misc Generic drug: Insulin  Syringe-Needle U-100 Use to inject insulin  twice daily.   Trulicity  3 MG/0.5ML Soaj Generic drug: Dulaglutide  Inject 3 mg as directed once a week.   umeclidinium bromide  62.5 MCG/ACT Aepb Commonly known as: INCRUSE ELLIPTA  Inhale 1 puff into the lungs daily.               Discharge Care Instructions  (From admission, onward)           Start     Ordered   06/30/24 0000  Discharge wound care:       Comments: Okay to shower and wash wounds with mild soap and water, pat dry. DAILY place saline moistened gauze in wound bed followed by dry gauze and a dry wrap or take to keep gauze in place. Change if becomes saturated.   06/30/24 1034           Activity: activity as tolerated Diet: regular diet Wound Care: wound vac changes weekly in office  Follow-up with [facility] in 1 week.  Signed: Donnice Sender, PA-C 07/14/2024 8:24 AM [facility] Office: [phone]

## 2024-07-16 ENCOUNTER — Other Ambulatory Visit: Payer: Self-pay

## 2024-07-16 ENCOUNTER — Telehealth: Payer: Self-pay | Admitting: Physician Assistant

## 2024-07-16 ENCOUNTER — Telehealth: Payer: Self-pay

## 2024-07-16 DIAGNOSIS — I739 Peripheral vascular disease, unspecified: Secondary | ICD-10-CM

## 2024-07-16 MED ORDER — FUROSEMIDE 40 MG PO TABS
40.0000 mg | ORAL_TABLET | Freq: Every day | ORAL | 1 refills | Status: AC
  Filled 2024-07-16: qty 90, 90d supply, fill #0

## 2024-07-16 NOTE — Transitions of Care (Post Inpatient/ED Visit) (Signed)
 07/16/2024  Name: Robert Sanchez MRN: [id number] DOB: April 06, 1956  Today's TOC FU Call Status: Today's TOC FU Call Status:: Successful TOC FU Call Completed TOC FU Call Complete Date: 07/16/24 Patient's Name and Date of Birth confirmed.  Transition Care Management Follow-up Telephone Call Date of Discharge: 07/13/24 Discharge Facility: [facility]) Type of Discharge: Inpatient Admission Primary Inpatient Discharge Diagnosis:: laceration multiple sites LLE How have you been since you were released from the hospital?: Better Any questions or concerns?: Yes Patient Questions/Concerns:: He is homeless and currently staying with a friend.   He stated that his friend recevied approval from her landlord for him to stay with her for 2 weeks. He is not sure what will happen after that and if he will be allowed to stay with her any longer. He has a wound VAC and needs to have electricity to charge the [facility].  He said he lost his job of 25 +years and he lost his apartment on labor day weekend. He currently has no income. He stated that his friend has helped him contact area shelters and the housing authority;  but he is not having any luck with housing  because he has no income.  He said he has been in contact with [employer] and is waiting to receive his social security retirement benefits.  I asked if he has applied for Medicare and he said he has.  He is 68 yo and if he has not been working and has not applied for Medicare Part B, he will have to pay a penalty.  He was also assisted with applying for Medicaid while he was in the hospital.  He stated that he does not have all of his medications because he can't afford them and he has not spoken to the [employer] about patient assistance programs. I instructed him to bring all of the medications he has to his appointment at [facility] this week for provider to review. Patient Questions/Concerns Addressed: Other:, Notified Provider of Patient  Questions/Concerns (I gave him the phone number for the [facility] and also encourged him to go to the [facility] and meet with one of the housing case workers.  I notified Greig Drones, NP of the status of his meds.)  Items Reviewed: Did you receive and understand the discharge instructions provided?: Yes Medications obtained,verified, and reconciled?: Yes (Medications Reviewed) (He said he is not taking some of the medications on the list because he doesn't have the money for them. He said he has not discussed the need for financial assist for obtaining meds with the pharmacy.  He confirmed her has a glucometer.) Any new allergies since your discharge?: No Dietary orders reviewed?: Yes Type of Diet Ordered:: low sodium, heart healthy, diabetic Do you have support at home?: Yes People in Home [RPT]: friend(s) Name of Support/Comfort Primary Source: he is currently staying with a friend  Medications Reviewed Today: Medications Reviewed Today     Reviewed by Marvis Bradley, RN (Case Manager) on 07/16/24 at 1653  Med List Status: <None>   Medication Order Taking? Sig Documenting Provider Last Dose Status Informant  albuterol  (VENTOLIN  HFA) 108 (90 Base) MCG/ACT inhaler [id number]  Inhale 2 puffs into the lungs every 6 hours as needed for wheezing or shortness of breath. Newlin, Enobong, MD  Active Self, Pharmacy Records  aspirin  81 MG EC tablet [id number]  Take 1 tablet (81 mg total) by mouth daily. Swallow whole. Raenelle Donalda HERO, MD  Active Self, Pharmacy Records  atorvastatin  (  LIPITOR ) 80 MG tablet [id number]  Take 1 tablet (80 mg total) by mouth at bedtime.  Patient not taking: Reported on 07/16/2024   Newlin, Enobong, MD  Active Self, Pharmacy Records  Blood Glucose Monitoring Suppl [facility] VERIO) w/Device KIT [id number]  Use to test blood sugars up to 4 times daily as directed. Newlin, Enobong, MD  Active Self, Pharmacy Records  carvedilol  (COREG ) 6.25 MG tablet [id number]  Take 1 tablet (6.25 mg total)  by mouth 2 (two) times daily with a meal. Delbert Clam, MD  Active Self, Pharmacy Records  Dulaglutide  (TRULICITY ) 3 MG/0.5ML SOAJ [id number]  Inject 3 mg as directed once a week.  Patient not taking: Reported on 07/16/2024   Newlin, Enobong, MD  Active Self, Pharmacy Records  furosemide  (LASIX ) 40 MG tablet [id number]  Take 1 tablet (40 mg total) by mouth daily. Newlin, Enobong, MD  Active   gabapentin  (NEURONTIN ) 300 MG capsule [id number]  Take 2 capsules (600 mg total) by mouth at bedtime.  Patient not taking: Reported on 07/16/2024   Newlin, Enobong, MD  Active Self, Pharmacy Records           Med Note [facility] Robertson, [place] A   Fri Jun 29, 2024  3:12 PM) PRN  glucose blood [facility] VERIO) test strip [id number]  Use to test blood sugars up to 4 times daily as directed. Newlin, Enobong, MD  Active Self, Pharmacy Records  insulin  glargine (LANTUS  SOLOSTAR) 100 UNIT/ML Solostar Pen [id number]  Inject 42 Units into the skin daily. Newlin, Enobong, MD  Active Self, Pharmacy Records  insulin  lispro (HUMALOG  KWIKPEN) 100 UNIT/ML KwikPen [id number]  Inject 5 Units into the skin daily before supper. Newlin, Enobong, MD  Active Self, Pharmacy Records  Insulin  Pen Needle (TRUEPLUS 5-BEVEL PEN NEEDLES) 32G X 4 MM MISC [id number]  Use as directed with insulin  pen Delbert Clam, MD  Active Self, Pharmacy Records  Insulin  Syringe-Needle U-100 30G X 5/16 1 ML MISC [id number]  Use to inject insulin  twice daily. Newlin, Enobong, MD  Active Self, Pharmacy Records  Lancets [facility] DELICA PLUS [place]) [place] [id number]  Use to test blood sugars up to 4 times daily as directed. Newlin, Enobong, MD  Active Self, Pharmacy Records  losartan  (COZAAR ) 25 MG tablet [id number]  Take 0.5 tablets (12.5 mg total) by mouth at bedtime.  Patient not taking: Reported on 07/16/2024   Newlin, Enobong, MD  Active Self, Pharmacy Records  metFORMIN  (GLUCOPHAGE ) 500 MG tablet [id number]  Take 2 tablets (1,000 mg total) by mouth 2 (two)  times daily with a meal. Delbert Clam, MD  Active Self, Pharmacy Records  Multiple Vitamin (MULTIVITAMIN) tablet [id number]  Take 1 tablet by mouth daily. [provider]  Active Self, Pharmacy Records  oxyCODONE  (OXY IR/ROXICODONE ) 5 MG immediate release tablet [id number]  Take 1 tablet (5 mg total) by mouth every 6 (six) hours as needed for severe pain (pain score 7-10). Baglia, Corrina, PA-C  Active   potassium chloride  (KLOR-CON ) 10 MEQ tablet [id number]  Take 1 tablet (10 mEq total) by mouth daily.  Patient not taking: Reported on 07/16/2024   Newlin, Enobong, MD  Active Self, Pharmacy Records    Discontinued 12/10/22 1050 (Reorder)   spironolactone  (ALDACTONE ) 25 MG tablet [id number]  Take 0.5 tablets (12.5 mg total) by mouth daily. Newlin, Enobong, MD  Active Self, Pharmacy Records  umeclidinium bromide  (INCRUSE ELLIPTA ) 62.5 MCG/ACT AEPB [id number]  Inhale 1 puff into the lungs daily.  Patient not taking: Reported on 07/16/2024   Newlin, Enobong,  MD  Active Self, Pharmacy Records  Med List Note Rolene Evern Devere DELENA Bishop 06/29/24 1516): Pt non-compliant with medications, stated that he does not take them as prescribed            Home Care and Equipment/Supplies: Were Home Health Services Ordered?: No Any new equipment or medical supplies ordered?: Yes Name of Medical supply agency?: [employer]- wound VAC Were you able to get the equipment/medical supplies?: Yes Do you have any questions related to the use of the equipment/supplies?: Yes What questions do you have?: he does not have any questions about the use of the machine and understands he needs to call [facility] with any questions and to schedule a VAC dressing change  Functional Questionnaire: Do you need assistance with bathing/showering or dressing?: No Do you need assistance with meal preparation?: No Do you need assistance with eating?: No Do you have difficulty maintaining continence: No Do you need assistance  with getting out of bed/getting out of a chair/moving?: No (He stated he is ambulating without an assistive device.) Do you have difficulty managing or taking your medications?: No  Follow up appointments reviewed: PCP Follow-up appointment confirmed?: Yes Date of PCP follow-up appointment?: 07/19/24 Follow-up Provider: Greig Drones, NP Specialist Hospital Follow-up appointment confirmed?: No Reason Specialist Follow-Up Not Confirmed: Patient has Specialist Provider Number and will Call for Appointment (He has hte number to call [facility]. He said they called him today  to schedule an appointment this week but he needs to check with his friend to see when she can take him. He said he hopes to go Fri, because it has been a week since his VAC was changed.) Do you need transportation to your follow-up appointment?: No (At this time, he said he asks his friend to drive him to his appointments so he has to arrange the appointments according to her schedule) Do you understand care options if your condition(s) worsen?: Yes-patient verbalized understanding  SDOH Interventions Today    Flowsheet Row Most Recent Value  SDOH Interventions   Housing Interventions --  [provided the patient with the contact information for [facility]]  Transportation Interventions Other (Comment)  [he said he relies on his friend to provide the rides to appointments.]  Financial Strain Interventions Other (Comment)  [He currently has no income. He said he has been in contact with [employer] and is waiting on receiving his social security benefits.]    SIGNATURE Slater Diesel, RN

## 2024-07-16 NOTE — Telephone Encounter (Signed)
 Amy- FYI  I see he has a hospital follow up with you on 07/19/2024.  I told him to make sure that he brings all of his medications with him to the appt.  We reviewed the med list and he doesn't have everything because he said he can't afford them.  He has never met with the pharmacy staff to see if he qualifies for patient assistance.   He also said he has applied for Medicaid and Medicare; but has not been approved yet.

## 2024-07-17 NOTE — Telephone Encounter (Signed)
 Noted

## 2024-07-18 ENCOUNTER — Encounter: Payer: Self-pay | Admitting: Vascular Surgery

## 2024-07-18 ENCOUNTER — Ambulatory Visit: Payer: MEDICAID | Attending: Vascular Surgery | Admitting: Vascular Surgery

## 2024-07-18 VITALS — BP 148/75 | HR 76 | Temp 98.1°F | Ht 68.0 in | Wt 205.0 lb

## 2024-07-18 DIAGNOSIS — I739 Peripheral vascular disease, unspecified: Secondary | ICD-10-CM

## 2024-07-18 NOTE — Telephone Encounter (Signed)
 Pt appt scheduled

## 2024-07-18 NOTE — Progress Notes (Signed)
     Subjective:     Patient ID: Robert Sanchez, male   DOB: 1956-10-25, 68 y.o.   MRN: [id number]  HPI 68 year old male recently underwent partial excision of left common femoral to anterior tibial artery bypass ultimately had wound VAC therapy was discharged home 5 days ago.  He states that the wound VAC malfunctioned and he is now here for change.   Review of Systems Wound VAC malfunction    Objective:   Physical Exam Vitals:   07/18/24 1441  BP: (!) 148/75  Pulse: 76  Temp: 98.1 F (36.7 C)  SpO2: 96%          Assessment/plan     68 year old male presented for wound VAC change today.  Wounds pictured above.  Follow-up next week for repeat change, hopefully can ultimately transition to wet-to-dry dressings.     Devarion Mcclanahan C. Sheree, MD [facility] Office: [phone] Pager: [phone]

## 2024-07-19 ENCOUNTER — Encounter: Payer: Self-pay | Admitting: Family

## 2024-07-19 ENCOUNTER — Other Ambulatory Visit: Payer: Self-pay

## 2024-07-19 ENCOUNTER — Telehealth: Payer: Self-pay | Admitting: Family

## 2024-07-19 ENCOUNTER — Telehealth: Payer: Self-pay | Admitting: *Deleted

## 2024-07-19 ENCOUNTER — Ambulatory Visit (INDEPENDENT_AMBULATORY_CARE_PROVIDER_SITE_OTHER): Payer: Self-pay | Admitting: Family

## 2024-07-19 VITALS — BP 115/62 | HR 69 | Temp 97.9°F | Resp 18 | Ht 68.0 in | Wt 208.8 lb

## 2024-07-19 DIAGNOSIS — Z599 Problem related to housing and economic circumstances, unspecified: Secondary | ICD-10-CM

## 2024-07-19 DIAGNOSIS — Z09 Encounter for follow-up examination after completed treatment for conditions other than malignant neoplasm: Secondary | ICD-10-CM

## 2024-07-19 DIAGNOSIS — Z9889 Other specified postprocedural states: Secondary | ICD-10-CM

## 2024-07-19 DIAGNOSIS — S8992XD Unspecified injury of left lower leg, subsequent encounter: Secondary | ICD-10-CM

## 2024-07-19 DIAGNOSIS — Z139 Encounter for screening, unspecified: Secondary | ICD-10-CM

## 2024-07-19 DIAGNOSIS — F418 Other specified anxiety disorders: Secondary | ICD-10-CM | POA: Diagnosis not present

## 2024-07-19 DIAGNOSIS — F32A Depression, unspecified: Secondary | ICD-10-CM

## 2024-07-19 DIAGNOSIS — S81812D Laceration without foreign body, left lower leg, subsequent encounter: Secondary | ICD-10-CM

## 2024-07-19 DIAGNOSIS — Z5900 Homelessness unspecified: Secondary | ICD-10-CM | POA: Diagnosis not present

## 2024-07-19 MED ORDER — FLUOXETINE HCL 10 MG PO CAPS
10.0000 mg | ORAL_CAPSULE | Freq: Every day | ORAL | 0 refills | Status: AC
  Filled 2024-07-19: qty 90, 90d supply, fill #0

## 2024-07-19 NOTE — Telephone Encounter (Signed)
 Spoke to [employer]. Pharm Tech regarding questions about patient's account at pharmacy. Information was relayed to patient.   Patient aware that his balance is $115 with $91 of that being past due, so if he can put at least $10 towards it to keep the account open that would be good. Patient states he has $1.46.     Advised of a note that the pharmacy has been telling him of his balance since last year.    Advised he will have to discuss with the pharmacy. Since his balance is barely over $100 they should work with him but he will have to talk to them. The scripts should be ready after 12.

## 2024-07-19 NOTE — Progress Notes (Signed)
 Patient ID: Robert Sanchez, male    DOB: Aug 13, 1956  MRN: [id number]  CC: Hospital Discharge Follow-Up  Subjective: Robert Sanchez is a 68 y.o. male who presents for hospital discharge follow-up.   His concerns today include:  Patient seen on 06/29/2024 - 07/13/2024 (14 days) at [facility] for laceration of multiple sites of left lower extremity initial and additional diagnoses. Today patient reports feeling stable however having some pain of left lower extremity states due to he was seen by Vascular & Vein specialist on yesterday who cleaned his wound. States he is unable to afford his medications due to being unemployed. Anxiety depression related to being unemployed and homeless (states he is living with a friend but has to be out by next week). He denies thoughts of self-harm, suicidal ideations, homicidal ideations.  Patient Active Problem List   Diagnosis Date Noted   Status post surgery 06/29/2024   Injury of left lower extremity 06/29/2024   Emphysema lung (HCC) 04/24/2024   COPD with acute exacerbation (HCC) 06/06/2022   Class 1 obesity    CKD (chronic kidney disease) stage 2, GFR 60-89 ml/min    Pneumonia due to COVID-19 virus 06/05/2022   CHF (congestive heart failure) (HCC) 08/28/2021   Acute hypoxemic respiratory failure (HCC) 08/28/2021   Hyperglycemia 08/28/2021   AKI (acute kidney injury) (HCC) 08/28/2021   Elevated troponin 08/28/2021   Hypertension associated with diabetes (HCC) 02/19/2021   Critical limb ischemia with history of revascularization of same extremity (HCC) 01/06/2021   Acute venous embolism and thrombosis of deep vessels of proximal lower extremity (HCC) 07/16/2020   Cellulitis of foot 01/15/2020   Critical ischemia of extremity with history of revascularization of same extremity (HCC) 01/15/2020   Leukocytosis 01/15/2020   CKD (chronic kidney disease), stage III (HCC) 01/15/2020   Acute on chronic systolic CHF (congestive heart failure)  (HCC) 05/04/2019   Mild renal insufficiency 05/04/2019   LFT elevation 05/04/2019   PAD (peripheral artery disease) (HCC) 08/21/2018   PVD (peripheral vascular disease) (HCC) 07/18/2018   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   CAD (coronary artery disease) 06/05/2018   Pulmonary edema 06/03/2018   DKA, type 2 (HCC) 06/03/2018   Diabetic acidosis without coma (HCC)    Sebaceous cyst 08/23/2016   Acute pain of right knee 08/23/2016   Hydradenitis 01/16/2016   Right hip pain 08/14/2014   Type 2 diabetes mellitus with hyperlipidemia (HCC) 11/27/2013   Arthritis 11/27/2013   Neuropathy in diabetes (HCC) 11/27/2013     Current Outpatient Medications on File Prior to Visit  Medication Sig Dispense Refill   Blood Glucose Monitoring Suppl (ONETOUCH VERIO) w/Device KIT Use to test blood sugars up to 4 times daily as directed. 1 kit 0   carvedilol  (COREG ) 6.25 MG tablet Take 1 tablet (6.25 mg total) by mouth 2 (two) times daily with a meal. 180 tablet 1   glucose blood (ONETOUCH VERIO) test strip Use to test blood sugars up to 4 times daily as directed. 100 each 2   insulin  glargine (LANTUS  SOLOSTAR) 100 UNIT/ML Solostar Pen Inject 42 Units into the skin daily. 30 mL 6   insulin  lispro (HUMALOG  KWIKPEN) 100 UNIT/ML KwikPen Inject 5 Units into the skin daily before supper. 9 mL 6   Insulin  Pen Needle (TRUEPLUS 5-BEVEL PEN NEEDLES) 32G X 4 MM MISC Use as directed with insulin  pen 200 each 2   Insulin  Syringe-Needle U-100 30G X 5/16 1 ML MISC Use to inject insulin   twice daily. 100 each 3   Lancets (ONETOUCH DELICA PLUS LANCET33G) MISC Use to test blood sugars up to 4 times daily as directed. 100 each 2   metFORMIN  (GLUCOPHAGE ) 500 MG tablet Take 2 tablets (1,000 mg total) by mouth 2 (two) times daily with a meal. 360 tablet 1   oxyCODONE  (OXY IR/ROXICODONE ) 5 MG immediate release tablet Take 1 tablet (5 mg total) by mouth every 6 (six) hours as needed for severe pain (pain score 7-10). 30 tablet 0    spironolactone  (ALDACTONE ) 25 MG tablet Take 0.5 tablets (12.5 mg total) by mouth daily. 45 tablet 1   albuterol  (VENTOLIN  HFA) 108 (90 Base) MCG/ACT inhaler Inhale 2 puffs into the lungs every 6 hours as needed for wheezing or shortness of breath. (Patient not taking: Reported on 07/19/2024) 6.7 g 1   aspirin  81 MG EC tablet Take 1 tablet (81 mg total) by mouth daily. Swallow whole. (Patient not taking: Reported on 07/19/2024)     atorvastatin  (LIPITOR ) 80 MG tablet Take 1 tablet (80 mg total) by mouth at bedtime. (Patient not taking: Reported on 07/19/2024) 90 tablet 1   Dulaglutide  (TRULICITY ) 3 MG/0.5ML SOAJ Inject 3 mg as directed once a week. (Patient not taking: Reported on 07/19/2024) 2 mL 6   furosemide  (LASIX ) 40 MG tablet Take 1 tablet (40 mg total) by mouth daily. (Patient not taking: Reported on 07/19/2024) 90 tablet 1   gabapentin  (NEURONTIN ) 300 MG capsule Take 2 capsules (600 mg total) by mouth at bedtime. (Patient not taking: Reported on 07/19/2024) 180 capsule 1   losartan  (COZAAR ) 25 MG tablet Take 0.5 tablets (12.5 mg total) by mouth at bedtime. 45 tablet 3   Multiple Vitamin (MULTIVITAMIN) tablet Take 1 tablet by mouth daily. (Patient not taking: Reported on 07/19/2024)     potassium chloride  (KLOR-CON ) 10 MEQ tablet Take 1 tablet (10 mEq total) by mouth daily. (Patient not taking: Reported on 07/19/2024) 90 tablet 1   umeclidinium bromide  (INCRUSE ELLIPTA ) 62.5 MCG/ACT AEPB Inhale 1 puff into the lungs daily. (Patient not taking: Reported on 07/19/2024) 30 each 6   [DISCONTINUED] potassium chloride  SA (KLOR-CON  M) 20 MEQ tablet Take 1 tablet (20 mEq total) by mouth daily. 30 tablet 6   No current facility-administered medications on file prior to visit.    Allergies  Allergen Reactions   Lisinopril  Other (See Comments)    Syncope   Codeine Rash    Social History   Socioeconomic History   Marital status: Single    Spouse name: Not on file   Number of children: 1   Years of  education: Not on file   Highest education level: 10th grade  Occupational History   Not on file  Tobacco Use   Smoking status: Some Days    Current packs/day: 1.00    Average packs/day: 1 pack/day for 50.0 years (50.0 ttl pk-yrs)    Types: Cigarettes   Smokeless tobacco: Never   Tobacco comments:    1PPD x 50 years. 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   Vaping Use   Vaping status: Never Used  Substance and Sexual Activity   Alcohol use: Yes    Comment: occasional   Drug use: Yes    Types: Marijuana    Comment: Smokes Marijuana once a week   Sexual activity: Not on file  Other Topics Concern   Not on file  Social History Narrative   Not on file   Social Drivers of Health   Financial  Resource Strain: High Risk (07/16/2024)   Overall Financial Resource Strain (CARDIA)    Difficulty of Paying Living Expenses: Very hard  Food Insecurity: No Food Insecurity (06/30/2024)   Hunger Vital Sign    Worried About Running Out of Food in the Last Year: Never true    Ran Out of Food in the Last Year: Never true  Transportation Needs: Unmet Transportation Needs (07/16/2024)   PRAPARE - Transportation    Lack of Transportation (Medical): Yes    Lack of Transportation (Non-Medical): Yes  Physical Activity: Inactive (01/10/2023)   Exercise Vital Sign    Days of Exercise per Week: 0 days    Minutes of Exercise per Session: 0 min  Stress: No Stress Concern Present (01/10/2023)   [employer] of Occupational Health - Occupational Stress Questionnaire    Feeling of Stress : Only a little  Recent Concern: Stress - Stress Concern Present (01/10/2023)   [employer] of Occupational Health - Occupational Stress Questionnaire    Feeling of Stress : To some extent  Social Connections: Patient Declined (06/30/2024)   Social Connection and Isolation Panel    Frequency of Communication with Friends and Family: Patient declined    Frequency of Social Gatherings with Friends and Family:  Patient declined    Attends Religious Services: Patient declined    Active Member of Clubs or Organizations: Patient declined    Attends Banker Meetings: Patient declined    Marital Status: Patient declined  Intimate Partner Violence: Not At Risk (06/30/2024)   Humiliation, Afraid, Rape, and Kick questionnaire    Fear of Current or Ex-Partner: No    Emotionally Abused: No    Physically Abused: No    Sexually Abused: No    Family History  Problem Relation Age of Onset   Diabetes Mother    Lung disease Mother    Cancer Father    Heart disease Father     Past Surgical History:  Procedure Laterality Date   ABDOMINAL AORTOGRAM W/LOWER EXTREMITY N/A 06/21/2018   Procedure: ABDOMINAL AORTOGRAM W/LOWER EXTREMITY;  Surgeon: Gretta Lonni PARAS, MD;  Location: [facility];  Service: Cardiovascular;  Laterality: N/A;   ABDOMINAL AORTOGRAM W/LOWER EXTREMITY Left 01/16/2020   Procedure: ABDOMINAL AORTOGRAM W/LOWER EXTREMITY;  Surgeon: Gretta Lonni PARAS, MD;  Location: [facility];  Service: Cardiovascular;  Laterality: Left;   ABDOMINAL AORTOGRAM W/LOWER EXTREMITY N/A 07/09/2020   Procedure: ABDOMINAL AORTOGRAM W/LOWER EXTREMITY;  Surgeon: Gretta Lonni PARAS, MD;  Location: [facility];  Service: Cardiovascular;  Laterality: N/A;  TPA infusion   ABDOMINAL AORTOGRAM W/LOWER EXTREMITY Bilateral 11/20/2021   Procedure: ABDOMINAL AORTOGRAM W/LOWER EXTREMITY;  Surgeon: Magda Debby SAILOR, MD;  Location: [facility];  Service: Cardiovascular;  Laterality: Bilateral;   AMPUTATION Left 08/04/2020   Procedure: LEFT PARTIAL GREAT TOE AMPUTATION;  Surgeon: Gretta Lonni PARAS, MD;  Location: [facility] OR;  Service: Vascular;  Laterality: Left;   ANGIOPLASTY Left 11/23/2021   Procedure: ANGIOPLASTY with BOVINE PATCH 1cmx6cm;  Surgeon: Gretta Lonni PARAS, MD;  Location: [facility] OR;  Service: Vascular;  Laterality: Left;   APPLICATION OF WOUND VAC Left 08/04/2020   Procedure:  LEFT GROIN DEBRIDEMENT WITH APPLICATION OF WOUND VAC;  Surgeon: Gretta Lonni PARAS, MD;  Location: [facility] OR;  Service: Vascular;  Laterality: Left;   APPLICATION OF WOUND VAC Left 06/29/2024   Procedure: APPLICATION, WOUND VAC TO LEFT LOWER EXTREMITY;  Surgeon: Magda Debby SAILOR, MD;  Location: [facility] OR;  Service: Vascular;  Laterality: Left;   CORONARY ARTERY BYPASS GRAFT N/A 06/12/2018   Procedure: CORONARY ARTERY BYPASS GRAFTING (CABG) x 3 WITH ENDOSCOPIC HARVESTING OF RIGHT GREATER SAPHENOUS VEIN. LIMA TO LAD. SVG TO PD. SVG TO DIAGONAL.;  Surgeon: Fleeta Hanford Coy, MD;  Location: [facility] OR;  Service: Open Heart Surgery;  Laterality: N/A;   ENDARTERECTOMY FEMORAL Left 11/23/2021   Procedure: ENDARTERECTOMY FEMORAL;  Surgeon: Gretta Lonni PARAS, MD;  Location: [facility] OR;  Service: Vascular;  Laterality: Left;   FEMORAL-POPLITEAL BYPASS GRAFT Left 07/10/2020   Procedure: REDO EXPOSURE OF LEFT COMMON FEMORAL ARTERY LEFT COMMON FEMORAL TO POSTERIOR TIBIAL COMPOSITE BYPASS GRAFT;  Surgeon: Gretta Lonni PARAS, MD;  Location: [facility] OR;  Service: Vascular;  Laterality: Left;   FEMORAL-POPLITEAL BYPASS GRAFT Left 06/29/2024   Procedure: PARTIAL EXCISION OF BYPASS GRAFT  LEFT FEMORAL-ANTERIOR TIBIAL ARTERY;  Surgeon: Magda Debby SAILOR, MD;  Location: [facility] OR;  Service: Vascular;  Laterality: Left;   FEMORAL-TIBIAL BYPASS GRAFT Left 08/21/2018   Procedure: BYPASS GRAFT LEFT FEMORAL TO POSTERIOR TIBIAL ARTERY USING LEFT REVERSED GREAT SAPHENOUS VEIN;  Surgeon: Gretta Lonni PARAS, MD;  Location: [facility];  Service: Vascular;  Laterality: Left;   FEMORAL-TIBIAL BYPASS GRAFT Left 08/04/2020   Procedure: REVISION FEMORAL-DISTAL BYPASS WITH BIOLOGICAL BOVINE PATCH;  Surgeon: Gretta Lonni PARAS, MD;  Location: [facility] OR;  Service: Vascular;  Laterality: Left;   FEMORAL-TIBIAL BYPASS GRAFT Left 11/23/2021   Procedure: REDO LEFT FEMORAL-TIBIAL ARTERY BYPASS GRAFT USING PROPATEN 6mm VASCULAR GRAFT REMOVABLE RING;  Surgeon: Gretta Lonni PARAS, MD;  Location: [facility] OR;  Service: Vascular;  Laterality: Left;  Insert arterial line   INCISION AND DRAINAGE OF WOUND Left 06/29/2024   Procedure: IRRIGATION AND EXCISIONAL DEBRIDEMENT OF TRAUMATIC WOUNDS OF LEFT LOWER EXTREMITY;  Surgeon: Magda Debby SAILOR, MD;  Location: [facility] OR;  Service: Vascular;  Laterality: Left;   KNEE ARTHROSCOPY     LOWER EXTREMITY ANGIOGRAM Left 08/04/2020   Procedure: LEFT ILIAC ANGIOGRAM, ANGIOPLASTY OF LEFT ILIAC STENT WITH DRUG COATED BALLOON;  Surgeon: Gretta Lonni PARAS, MD;  Location: [facility];  Service: Vascular;  Laterality: Left;   LOWER EXTREMITY ANGIOGRAPHY Left 01/17/2020   Procedure: Lower Extremity Angiography;  Surgeon: Gretta Lonni PARAS, MD;  Location: [facility] INVASIVE CV LAB;  Service: Cardiovascular;  Laterality: Left;   LOWER EXTREMITY ANGIOGRAPHY N/A 01/07/2021   Procedure: LOWER EXTREMITY ANGIOGRAPHY;  Surgeon: Magda Debby SAILOR, MD;  [facility]: [facility];  Service: Cardiovascular;  Laterality: N/A;   PERIPHERAL VASCULAR BALLOON ANGIOPLASTY Left 01/17/2020   Procedure: PERIPHERAL VASCULAR BALLOON ANGIOPLASTY;  Surgeon: Gretta Lonni PARAS, MD;  [facility];  Service: Cardiovascular;  Laterality: Left;  pt   PERIPHERAL VASCULAR INTERVENTION Left 06/21/2018   Procedure: PERIPHERAL VASCULAR INTERVENTION;  Surgeon: Gretta Lonni PARAS, MD;  [facility];  Service: Cardiovascular;  Laterality: Left;   PERIPHERAL VASCULAR INTERVENTION Left 01/08/2021   Procedure: PERIPHERAL VASCULAR INTERVENTION;  Surgeon: Gretta Lonni PARAS, MD;  [facility];  Service: Cardiovascular;  Laterality: Left;   PERIPHERAL VASCULAR THROMBECTOMY Left 01/16/2020   Procedure: PERIPHERAL VASCULAR THROMBECTOMY;  Surgeon: Gretta Lonni PARAS, MD;  [facility];  Service: Cardiovascular;  Laterality: Left;  Lytic Catheter Placement left fem-pop bypass   PERIPHERAL VASCULAR THROMBECTOMY Left 01/17/2020   Procedure:  PERIPHERAL VASCULAR THROMBECTOMY;  Surgeon: Gretta Lonni PARAS, MD;  [facility];  Service: Cardiovascular;  Laterality: Left;  fem-pt bypass   PERIPHERAL VASCULAR THROMBECTOMY Left 07/10/2020  Procedure: lysis recheck;  Surgeon: Gretta Lonni PARAS, MD;  Location: [facility] INVASIVE CV LAB;  Service: Cardiovascular;  Laterality: Left;   PULMONARY THROMBECTOMY N/A 01/08/2021   Procedure: LYSIS RECHECK;  Surgeon: Gretta Lonni PARAS, MD;  [facility];  Service: Cardiovascular;  Laterality: N/A;   RIGHT/LEFT HEART CATH AND CORONARY ANGIOGRAPHY N/A 06/05/2018   Procedure: RIGHT/LEFT HEART CATH AND CORONARY ANGIOGRAPHY;  Surgeon: Claudene Pacific, MD;  [facility]: [facility];  Service: Cardiovascular;  Laterality: N/A;   SPINE SURGERY     TEE WITHOUT CARDIOVERSION N/A 06/12/2018   Procedure: TRANSESOPHAGEAL ECHOCARDIOGRAM (TEE);  Surgeon: Fleeta Ochoa, Maude, MD;  Location: [facility] OR;  Service: Open Heart Surgery;  Laterality: N/A;   teeth extractions     THROMBECTOMY FEMORAL ARTERY Left 08/04/2020   Procedure: THROMBECTOMY OF LEFT FEMORAL TP COMPOSTITE BYPASS;  Surgeon: Gretta Lonni PARAS, MD;  [facility] OR;  Service: Vascular;  Laterality: Left;   THROMBECTOMY ILIAC ARTERY Left 07/10/2020   Procedure: THROMBECTOMY OF LEFT EXTERNAL ILIAC AND LEFT COMMON FEMORAL AND LEFT POSTERIOR TIBIAL ARTERIES;  Surgeon: Gretta Lonni PARAS, MD;  Location: [facility] OR;  Service: Vascular;  Laterality: Left;   ULTRASOUND GUIDANCE FOR VASCULAR ACCESS Bilateral 11/23/2021   Procedure: ULTRASOUND GUIDANCE FOR VASCULAR ACCESS;  Surgeon: Gretta Lonni PARAS, MD;  Location: [facility] OR;  Service: Vascular;  Laterality: Bilateral;   VEIN HARVEST Left 08/21/2018   Procedure: VEIN HARVEST LEFT GREAT SAPHENOUS VEIN;  Surgeon: Gretta Lonni PARAS, MD;  Location: [facility] OR;  Service: Vascular;  Laterality: Left;   VEIN HARVEST Right 11/23/2021   Procedure: VEIN HARVEST;  Surgeon: Gretta Lonni PARAS, MD;  Location:  [facility] OR;  Service: Vascular;  Laterality: Right;    ROS: Review of Systems Negative except as stated above  PHYSICAL EXAM: BP 115/62   Pulse 69   Temp 97.9 F (36.6 C) (Oral)   Resp 18   Ht 5' 8 (1.727 m)   Wt 208 lb 12.8 oz (94.7 kg)   SpO2 95%   BMI 31.75 kg/m   Physical Exam HENT:     Head: Normocephalic and atraumatic.     Nose: Nose normal.     Mouth/Throat:     Mouth: Mucous membranes are moist.     Pharynx: Oropharynx is clear.  Eyes:     Extraocular Movements: Extraocular movements intact.     Conjunctiva/sclera: Conjunctivae normal.     Pupils: Pupils are equal, round, and reactive to light.  Cardiovascular:     Rate and Rhythm: Normal rate and regular rhythm.     Pulses: Normal pulses.     Heart sounds: Normal heart sounds.  Pulmonary:     Effort: Pulmonary effort is normal.     Breath sounds: Normal breath sounds.  Musculoskeletal:        General: Normal range of motion.     Cervical back: Normal range of motion and neck supple.     Right hip: Normal.     Left hip: Normal.     Right upper leg: Normal.     Left upper leg: Normal.     Right knee: Normal.     Left knee: Normal.     Right lower leg: Normal.     Left lower leg: Normal.     Right ankle: Normal.     Left ankle: Normal.     Right foot: Normal.     Left foot: Normal.     Comments: Wound vac in place, clean/dry/intact.  Neurological:     General: No focal deficit present.     Mental Status: He is alert and oriented to person, place, and time.  Psychiatric:        Mood and Affect: Mood normal.        Behavior: Behavior normal.    ASSESSMENT AND PLAN: 1. Hospital discharge follow-up (Primary) - Reviewed hospital course, current medications, ensured proper follow-up in place, and addressed concerns.   2. Laceration of multiple sites of left lower extremity, subsequent encounter 3. Injury of left lower extremity, subsequent encounter 4. Status post surgery - Continue present management.  Keep all scheduled appointments with established Vascular & Vein.  5. Anxiety and depression - Patient denies thoughts of self-harm, suicidal ideations, homicidal ideations. - Fluoxetine  as prescribed. Counseled on medication adherence/adverse effects.  - Patient declined referral to Psychiatry.  - Follow-up with primary provider in 4 weeks or sooner if needed.  - FLUoxetine  (PROZAC ) 10 MG capsule; Take 1 capsule (10 mg total) by mouth daily.  Dispense: 90 capsule; Refill: 0  6. Homeless - Message sent to RN case manager for community resources and assistance.  7. Financial difficulties - Patient states he is unable to afford his medications due to being unemployed.  - Message sent to clinical pharmacist for assistance.  8. Encounter for screening involving social determinants of health (SDoH) - Referral to [facility] Care Management for community resources and assistance. - AMB Referral [facility] Care Management   Patient was given the opportunity to ask questions.  Patient verbalized understanding of the plan and was able to repeat key elements of the plan. Patient was given clear instructions to go to Emergency Department or return to medical center if symptoms don't improve, worsen, or new problems develop.The patient verbalized understanding.   Orders Placed This Encounter  Procedures   AMB Referral [facility] Care Management     Requested Prescriptions   Signed Prescriptions Disp Refills   FLUoxetine  (PROZAC ) 10 MG capsule 90 capsule 0    Sig: Take 1 capsule (10 mg total) by mouth daily.    Return for Follow-Up or next available with Enobong Newlin, MD.  Greig JINNY Drones, NP

## 2024-07-19 NOTE — Progress Notes (Signed)
 Hospital follow up?

## 2024-07-19 NOTE — Telephone Encounter (Signed)
 I called the patient and had to leave a message requesting a call back.    Regarding housing - on 07/16/2024, I gave him the phone number for the [facility] and also encourged him to go to the [facility] and meet with one of the housing case workers.  I need to see if he has been in contact with anyone from the [facility].   I will also ask him if he wants me to have Aloysius Ford, RN/ Congregational Nurse call him about possible shelter/ housing options.

## 2024-07-20 ENCOUNTER — Other Ambulatory Visit: Payer: Self-pay

## 2024-07-20 NOTE — Patient Outreach (Signed)
 Complex Care Management   Visit Note  07/20/2024  Name:  Robert Sanchez MRN: [id number] DOB: 1956/05/11  Situation: Referral received for Complex Care Management related to SDOH Barriers:  Housing   and left lower extremity wounds I obtained verbal consent from Patient.  Visit completed with Patient  on the phone  Background:   Past Medical History:  Diagnosis Date   Acute pulmonary edema (HCC) 06/05/2018   Arthritis    CHF (congestive heart failure) (HCC)    COPD (chronic obstructive pulmonary disease) (HCC)     beginning stages  per pt   Coronary artery disease    Diabetes mellitus without complication (HCC)    type 2   Diabetic neuropathy (HCC)    Diabetic neuropathy (HCC)    Emphysema/COPD (HCC)     beginning stages   GERD (gastroesophageal reflux disease)    HLD (hyperlipidemia)    HOH (hard of hearing)    no hearing aids   Myocardial infarction [facility])    Peripheral vascular disease (HCC)     Assessment: Patient Reported Symptoms:  Cognitive Cognitive Status: Able to follow simple commands, Alert and oriented to person, place, and time, Normal speech and language skills Cognitive/Intellectual Conditions Management [RPT]: None reported or documented in medical history or problem list      Neurological Neurological Review of Symptoms: Other:, Weakness Oher Neurological Symptoms/Conditions [RPT]: Neuropathy Neurological Management Strategies: Medication therapy, Routine screening  HEENT HEENT Symptoms Reported: No symptoms reported      Cardiovascular Cardiovascular Symptoms Reported: Swelling in legs or feet (Left leg swelling due to wounds) Does patient have uncontrolled Hypertension?: No Cardiovascular Management Strategies: Medication therapy Cardiovascular Comment: Patient reports ongoing issues with LLE wounds due to PAD  Respiratory Respiratory Symptoms Reported: No symptoms reported Additional Respiratory Details: Patient reports he has smoked most  of his life. Denies desire to quit smoking. He reports he is not compliant with maintenance inhaler. Respiratory Management Strategies: Routine screening  Endocrine Endocrine Symptoms Reported: No symptoms reported Is patient diabetic?: Yes Is patient checking blood sugars at home?: Yes List most recent blood sugar readings, include date and time of day: Couple times a day and sometimes not at all, it just depends on how I feel 125-175 typically    Gastrointestinal Gastrointestinal Symptoms Reported: No symptoms reported Additional Gastrointestinal Details: Patient reports a good appetite. He reports regular BMs.      Genitourinary Genitourinary Symptoms Reported: No symptoms reported    Integumentary Integumentary Symptoms Reported: Wound Additional Integumentary Details: Two wounds LLE, wound vac in place. Patient is going once a week to vascular provider to get wound vac changed. Note per most recent visit, they are hopeful he will be able to change to wet-to-dry dressings. Patient reports following next week when he no longer has housing, he will have to transition to wet-to-dry dressings as he will not have access to electricity. Skin Management Strategies: Routine screening, Medical device, Dressing changes Skin Comment: Patient reports he was discharged with pain medication but feels like he has built up a tolerance. He reports pain is constant.  Musculoskeletal Musculoskelatal Symptoms Reviewed: Other, Unsteady gait, Weakness Other Musculoskeletal Symptoms: L great toe amputation years ago Additional Musculoskeletal Details: Patient reports he is unable to walk very far. He reports he just gets tired. Does not use a walker or a cane. Musculoskeletal Management Strategies: Adequate rest, Coping strategies Falls in the past year?: Yes Number of falls in past year: 2 or more Was there an injury  with Fall?: No Fall Risk Category Calculator: 2 Patient Fall Risk Level: Moderate Fall  Risk Patient at Risk for Falls Due to: History of fall(s), Impaired balance/gait Fall risk Follow up: Falls evaluation completed, Education provided, Falls prevention discussed  Psychosocial Psychosocial Symptoms Reported: Anxiety - if selected complete GAD Additional Psychological Details: Patient reports he was prescribed Prozac  by PCP. He started medication yesterday.   Major Change/Loss/Stressor/Fears (CP): Resources Quality of Family Relationships: non-existent (Sister in [place] but they are not close) Do you feel physically threatened by others?: No    07/20/2024    PHQ2-9 Depression Screening   Little interest or pleasure in doing things Not at all  Feeling down, depressed, or hopeless Several days  PHQ-2 - Total Score 1  Trouble falling or staying asleep, or sleeping too much    Feeling tired or having little energy    Poor appetite or overeating     Feeling bad about yourself - or that you are a failure or have let yourself or your family down    Trouble concentrating on things, such as reading the newspaper or watching television    Moving or speaking so slowly that other people could have noticed.  Or the opposite - being so fidgety or restless that you have been moving around a lot more than usual    Thoughts that you would be better off dead, or hurting yourself in some way    PHQ2-9 Total Score    If you checked off any problems, how difficult have these problems made it for you to do your work, take care of things at home, or get along with other people    Depression Interventions/Treatment      There were no vitals filed for this visit.  Medications Reviewed Today     Reviewed by Arno Rosaline SQUIBB, RN (Registered Nurse) on 07/20/24 at 1451  Med List Status: <None>   Medication Order Taking? Sig Documenting Provider Last Dose Status Informant  albuterol  (VENTOLIN  HFA) 108 (90 Base) MCG/ACT inhaler [id number]  Inhale 2 puffs into the lungs every 6 hours as needed  for wheezing or shortness of breath.  Patient not taking: Reported on 07/20/2024   Newlin, Enobong, MD  Active Self, Pharmacy Records  aspirin  81 MG EC tablet [id number]  Take 1 tablet (81 mg total) by mouth daily. Swallow whole.  Patient not taking: Reported on 07/20/2024   Raenelle Donalda HERO, MD  Active Self, Pharmacy Records  atorvastatin  (LIPITOR ) 80 MG tablet [id number] Yes Take 1 tablet (80 mg total) by mouth at bedtime. Newlin, Enobong, MD  Active Self, Pharmacy Records  Blood Glucose Monitoring Suppl [facility] VERIO) w/Device KIT [id number] Yes Use to test blood sugars up to 4 times daily as directed. Newlin, Enobong, MD  Active Self, Pharmacy Records  carvedilol  (COREG ) 6.25 MG tablet [id number] Yes Take 1 tablet (6.25 mg total) by mouth 2 (two) times daily with a meal. Delbert Clam, MD  Active Self, Pharmacy Records  Dulaglutide  (TRULICITY ) 3 MG/0.5ML SOAJ [id number] Yes Inject 3 mg as directed once a week. Newlin, Enobong, MD  Active Self, Pharmacy Records  FLUoxetine  (PROZAC ) 10 MG capsule [id number] Yes Take 1 capsule (10 mg total) by mouth daily. Lorren Greig PARAS, NP  Active   furosemide  (LASIX ) 40 MG tablet [id number] Yes Take 1 tablet (40 mg total) by mouth daily. Newlin, Enobong, MD  Active   gabapentin  (NEURONTIN ) 300 MG capsule [id number]  Take 2 capsules (600 mg total) by mouth at bedtime.  Patient not taking: Reported on 07/20/2024   Newlin, Enobong, MD  Active Self, Pharmacy Records           Med Note [facility] [place], [place]   Fri Jun 29, 2024  3:12 PM) PRN  glucose blood [facility] VERIO) test strip [id number] Yes Use to test blood sugars up to 4 times daily as directed. Newlin, Enobong, MD  Active Self, Pharmacy Records  insulin  glargine (LANTUS  SOLOSTAR) 100 UNIT/ML Solostar Pen [id number] Yes Inject 42 Units into the skin daily. Newlin, Enobong, MD  Active Self, Pharmacy Records  insulin  lispro (HUMALOG  KWIKPEN) 100 UNIT/ML KwikPen [id number] Yes Inject 5 Units into the skin daily  before supper. Newlin, Enobong, MD  Active Self, Pharmacy Records  Insulin  Pen Needle (TRUEPLUS 5-BEVEL PEN NEEDLES) 32G X 4 MM MISC [id number] Yes Use as directed with insulin  pen Newlin, Enobong, MD  Active Self, Pharmacy Records  Insulin  Syringe-Needle U-100 30G X 5/16 1 ML MISC [id number] Yes Use to inject insulin  twice daily. Newlin, Enobong, MD  Active Self, Pharmacy Records  Lancets [facility] DELICA PLUS [place]) [place] [id number] Yes Use to test blood sugars up to 4 times daily as directed. Newlin, Enobong, MD  Active Self, Pharmacy Records  losartan  (COZAAR ) 25 MG tablet [id number] Yes Take 0.5 tablets (12.5 mg total) by mouth at bedtime. Newlin, Enobong, MD  Active Self, Pharmacy Records  metFORMIN  (GLUCOPHAGE ) 500 MG tablet [id number] Yes Take 2 tablets (1,000 mg total) by mouth 2 (two) times daily with a meal. Delbert Clam, MD  Active Self, Pharmacy Records  Multiple Vitamin (MULTIVITAMIN) tablet [id number] Yes Take 1 tablet by mouth daily. [provider]  Active Self, Pharmacy Records  oxyCODONE  (OXY IR/ROXICODONE ) 5 MG immediate release tablet [id number] Yes Take 1 tablet (5 mg total) by mouth every 6 (six) hours as needed for severe pain (pain score 7-10). Baglia, Corrina, PA-C  Active   potassium chloride  (KLOR-CON ) 10 MEQ tablet [id number] Yes Take 1 tablet (10 mEq total) by mouth daily. Newlin, Enobong, MD  Active Self, Pharmacy Records    Discontinued 12/10/22 1050 (Reorder)   spironolactone  (ALDACTONE ) 25 MG tablet [id number] Yes Take 0.5 tablets (12.5 mg total) by mouth daily. Newlin, Enobong, MD  Active Self, Pharmacy Records  umeclidinium bromide  (INCRUSE ELLIPTA ) 62.5 MCG/ACT AEPB [id number]  Inhale 1 puff into the lungs daily.  Patient not taking: Reported on 07/20/2024   Newlin, Enobong, MD  Active Self, Pharmacy Records  Med List Note Rolene Evern Devere DELENA Bishop 06/29/24 1516): Pt non-compliant with medications, stated that he does not take them as prescribed             Recommendation:   Continue Current Plan of Care Continue to work with pharmacy to obtain all medications Work with [employer] regarding housing instability and financial strain  Follow Up Plan:   Telephone follow-up within 14 days  Rosaline Finlay, RN MSN [place]  VBCI Population Health RN Care Manager Direct Dial : [phone]  Fax: [phone]

## 2024-07-20 NOTE — Patient Outreach (Signed)
 BSW called patient to schedule appt for initial outreach. Pt confirmed he is available to speak on Monday 09/22 at 3pm. BSW will be giving him a call at 3pm but advised it may be earlier if his 1pm patient does not answer. Patient stated that's fine call me whenever.

## 2024-07-20 NOTE — Telephone Encounter (Signed)
 noted

## 2024-07-20 NOTE — Telephone Encounter (Signed)
 Noted

## 2024-07-20 NOTE — Patient Instructions (Signed)
 Visit Information  Thank you for taking time to visit with me today. Please don't hesitate to contact me if I can be of assistance to you before our next scheduled appointment.  Our next appointment is by telephone within 14 days Please call the care guide team at [phone] if you need to cancel or reschedule your appointment.   Following is a copy of your care plan:   Goals Addressed             This Visit's Progress    VBCI RN Care Plan   On track    Problems:  Care Coordination needs related to Financial Strain  and Housing  Chronic Disease Management support and education needs related to left lower extremity wounds  Goal: Over the next 30 days the Patient will demonstrate Ongoing adherence to prescribed treatment plan for left lower extremity wound as evidenced by attending all provider appointment, following wound care instructions, and monitoring for symptoms of infection take all medications exactly as prescribed and will call provider for medication related questions as evidenced by patient report of medication adherence    work with pharmacist to address Medication procurement related to financial strain as evidenced by review of electronic medical record and patient or pharmacist report    work with Child psychotherapist to address Housing barriers related to the management of financial strain as evidenced by review of electronic medical record and patient or social worker report      Interventions:   Evaluation of current treatment plan related to left lower extremity wound, Financial constraints related to loss of employment, Housing barriers, and Medication procurement self-management and patient's adherence to plan as established by provider. Discussed plans with patient for ongoing care management follow up and provided patient with direct contact information for care management team Reviewed medications with patient and discussed importance of medication  compliance Collaborated with [place] Munoz-Lopez, BSW, regarding risk for homelessness. Requested BSW reach out to patient as soon as schedule allows to provide resources (referral has already been placed by PCP) Discussed plans with patient for ongoing care management follow up and provided patient with direct contact information for care management team Screening for signs and symptoms of depression related to chronic disease state  Assessed social determinant of health barriers  Patient Self-Care Activities:  Attend all scheduled provider appointments Call provider office for new concerns or questions  Take medications as prescribed   Work with the social worker to address care coordination needs and will continue to work with the clinical team to address health care and disease management related needs Work with the pharmacist to address medication management needs and will continue to work with the clinical team to address health care and disease management related needs Continue to attend all provider visits and follow instructions for wound care  Plan:  The care management team will reach out to the patient again over the next 14 days.             Please call the Suicide and Crisis Lifeline: 988 call 1-800-273-TALK (toll free, 24 hour hotline) if you are experiencing a Mental Health or Behavioral Health Crisis or need someone to talk to.  The patient verbalized understanding of instructions, educational materials, and care plan provided today and DECLINED offer to receive copy of patient instructions, educational materials, and care plan.   Robert Finlay, RN MSN [place]  [facility] Health RN Care Manager Direct Dial : [phone]  Fax: [phone]

## 2024-07-23 ENCOUNTER — Other Ambulatory Visit: Payer: Self-pay

## 2024-07-23 NOTE — Patient Instructions (Signed)
 Visit Information  Thank you for taking time to visit with me today. Please don't hesitate to contact me if I can be of assistance to you before our next scheduled appointment.  Our next appointment is by telephone on 07/31/2024 at 2pm Please call the care guide team at [phone] if you need to cancel or reschedule your appointment.   Following is a copy of your care plan:   Goals Addressed             This Visit's Progress    BSW VBCI Social Work Care Plan   On track    Problems:   Financial Strain , Housing , and Transportation  CSW Clinical Goal(s):   Over the next 2 weeks the Patient will work with Child psychotherapist to address concerns related to housing, financial strain, and transportation.  Interventions:  BSW will provided resources for SDOH needs.  Patient Goals/Self-Care Activities:  Secure housing or get into a shelter and avoid being on the street.   Plan:   Telephone follow up appointment with care management team member scheduled for:  07/31/2024 at 2pm.         Please call the Suicide and Crisis Lifeline: 988 go to [facility]gent [facility] [address] [place] [phone]) call 911 if you are experiencing a Mental Health or Behavioral Health Crisis or need someone to talk to.  Patient verbalizes understanding of instructions and care plan provided today and agrees to view in MyChart. Active MyChart status and patient understanding of how to access instructions and care plan via MyChart confirmed with patient.     Laymon Doll, BSW [place]/VBCI - [employer] Social Worker [phone]

## 2024-07-23 NOTE — Telephone Encounter (Signed)
 Noted

## 2024-07-23 NOTE — Patient Outreach (Signed)
 Complex Care Management   Visit Note  07/23/2024  Name:  Robert Sanchez MRN: [id number] DOB: [birth year]  Situation: Referral received for Complex Care Management related to SDOH Barriers:  Transportation Housing homelessness. Financial Resource Strain I obtained verbal consent from Patient.  Visit completed with Patient  on the phone  Background:   Past Medical History:  Diagnosis Date   Acute pulmonary edema (HCC) 06/05/2018   Arthritis    CHF (congestive heart failure) (HCC)    COPD (chronic obstructive pulmonary disease) (HCC)     beginning stages  per pt   Coronary artery disease    Diabetes mellitus without complication (HCC)    type 2   Diabetic neuropathy (HCC)    Diabetic neuropathy (HCC)    Emphysema/COPD     beginning stages   GERD (gastroesophageal reflux disease)    HLD (hyperlipidemia)    HOH (hard of hearing)    no hearing aids   Myocardial infarction [facility])    Peripheral vascular disease     Assessment: BSW held initial outreach with patient. Patient was alert and cognitive. SDOH needs were assessed and the following needs were confirmed by pt: housing (homelessness), financial strain, and transportation. Pt reports losing his home after being laid off from work and then suffered an a leg injury that currently prevents him from working. Pt states he has an appt with [facility] office in October but cannot get anything sooner. Pt's main concern is housing. Pt is currently staying with friend but believes he will have to leave this Friday. Pt does not have money to pay for hotel or anywhere to go. BSW and patient called several shelters and were not able to locate a bed placement at this time. Pt was put on wait list with [facility] shelter in [place] and instructed to call back in a week. [facility] also [facility] [place] and will be reaching out to shelter director Andrew Pochaet via email. BSW also called [facility], however patient  is not currently eligible for coordinated entry due to not being homeless as defined by HUD (living in a car, street, or abandoned building with no power or water). No other resources were provided/requested at this time.   SDOH Interventions    Flowsheet Row Patient Outreach Telephone from 07/20/2024 in [place] POPULATION HEALTH DEPARTMENT Telephone from 07/16/2024 in [facility] Comm Health [place] - A Dept Of [place]. [facility] Office Visit from 01/10/2023 in [facility] Health Comm Health [place] - A Dept Of [place]. [facility] ED to Hosp-Admission (Discharged) from 08/27/2021 in  HOSPITAL 3E HF PCU  SDOH Interventions      Food Insecurity Interventions Intervention Not Indicated -- -- Intervention Not Indicated  Housing Interventions AMB Referral  [Urgent referral has been placed by PCP. message sent to BSW] --  [provided the patient with the contact information for IRC] Intervention Not Indicated Intervention Not Indicated  Transportation Interventions Patient Resources (Friends/Family) Other (Comment)  [he said he relies on his friend to provide the rides to appointments.] Intervention Not Indicated Intervention Not Indicated  Utilities Interventions AMB Referral -- Intervention Not Indicated --  Alcohol Usage Interventions -- -- Intervention Not Indicated (Score <7) --  Financial Strain Interventions -- Other (Comment)  [He currently has no income. He said he has been in contact with [facility] and is waiting on receiving his social security benefits.] Intervention Not Indicated Intervention Not Indicated  Physical Activity  Interventions -- -- Intervention Not Indicated --  Stress Interventions -- -- Intervention Not Indicated --      Recommendation:   Call shelters in the morning to confirm bed availability.   Follow Up Plan:   Telephone follow up appointment date/time:  07/31/2024  Laymon Doll, BSW [place]/VBCI - [facility] Social  Worker [phone]

## 2024-07-23 NOTE — Progress Notes (Unsigned)
 VASCULAR AND VEIN SPECIALISTS OF [place] PROGRESS NOTE  ASSESSMENT / PLAN: Robert Sanchez is a 68 y.o. 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  Some social issues making postoperative discharge difficult.  He ultimately has been maintained on wound VAC therapy.  He has achieved a healthy granulation bed, and the wound is flat.  We will now transition to wet-to-dry dressings.  I counseled the patient regarding how to care for these wounds.  VVS PA will see him again in a month to ensure the wounds are progressing appropriately.  SUBJECTIVE: Returns to clinic.  He is doing well overall.  He has no complaints.  His wounds appear to be healing appropriately.  OBJECTIVE: BP (!) 151/83 (BP Location: Left Arm, Patient Position: Sitting, Cuff Size: Normal)   Pulse 100   Temp 97.8 F (36.6 C)   Wt 208 lb (94.3 kg)   SpO2 96%   BMI 31.63 kg/m           Latest Ref Rng & Units 07/06/2024    4:32 AM 06/30/2024    7:21 AM 06/29/2024   12:44 PM  CBC  WBC 4.0 - 10.5 K/uL 10.2  11.3    Hemoglobin 13.0 - 17.0 g/dL 88.5  86.5  89.0   Hematocrit 39.0 - 52.0 % 34.7  41.4  32.0   Platelets 150 - 400 K/uL 306  311          Latest Ref Rng & Units 07/07/2024    6:38 AM 07/06/2024    4:32 AM 07/05/2024    4:10 AM  CMP  Glucose 70 - 99 mg/dL 98  878  891   BUN 8 - 23 mg/dL 24  30  40   Creatinine 0.61 - 1.24 mg/dL 8.94  8.75  8.44   Sodium 135 - 145 mmol/L 138  135  132   Potassium 3.5 - 5.1 mmol/L 4.6  4.6  4.4   Chloride 98 - 111 mmol/L 104  107  100   CO2 22 - 32 mmol/L 23  21  20    Calcium  8.9 - 10.3 mg/dL 8.9  8.6  8.3     Estimated Creatinine Clearance: 77.1 mL/min (by C-G formula based on SCr of 1.05 mg/dL).  Debby SAILOR. Magda, MD [facility] Vascular and Vein Specialists of [facility] Phone Number: [phone] 07/24/2024 5:01 PM

## 2024-07-24 ENCOUNTER — Encounter: Payer: Self-pay | Admitting: Vascular Surgery

## 2024-07-24 ENCOUNTER — Ambulatory Visit: Payer: Self-pay | Attending: Vascular Surgery | Admitting: Vascular Surgery

## 2024-07-24 VITALS — BP 151/83 | HR 100 | Temp 97.8°F | Wt 208.0 lb

## 2024-07-24 DIAGNOSIS — I739 Peripheral vascular disease, unspecified: Secondary | ICD-10-CM

## 2024-07-24 NOTE — Telephone Encounter (Signed)
 I spoke to the patient and he was on his way to an MD appointment.  Regarding housing, he said he thinks he has  exhausted  all of his resources and has  checked with everyone but has not had any luck finding housing/shelter. He also has no income.   I asked him if he has been to the [facility] and he said no, he has called there but has not been able to speak with anyone.  I asked him if he would like me to have Aloysius Ford, RN/ Congregational Nurse call him to see if she can put him in contact with a housing caseworker at the [facility] and he was in agreement.    Raven- can you please call him when you have a chance?  He would be very appreciative of any assistance you can offer. Thanks so much!

## 2024-07-31 ENCOUNTER — Other Ambulatory Visit: Payer: Self-pay

## 2024-07-31 NOTE — Patient Instructions (Signed)
 Visit Information  Robert Sanchez was given information about Medicaid Managed Care team care coordination services as a part of their [facility] Medicaid benefit.   If you would like to schedule transportation through your [facility] plan, please call the following number at least 2 days in advance of your appointment: [phone].   You can also use the MTM portal or MTM mobile app to manage your rides. Reimbursement for transportation is available through [facility]! For the portal, please go to [email or web address]  Call the [facility] Crisis Line at [phone], at any time, 24 hours a day, 7 days a week. If you are in danger or need immediate medical attention call 911.   The patient verbalized understanding of instructions, educational materials, and care plan provided today and DECLINED offer to receive copy of patient instructions, educational materials, and care plan.   Telephone follow up appointment with Managed Medicaid care management team member scheduled for: 08/09/2024 at 2:45pm  Laymon Doll, BSW [place]/VBCI - [facility] Social Worker [phone]   Following is a copy of your plan of care:  There are no care plans that you recently modified to display for this patient.

## 2024-07-31 NOTE — Patient Outreach (Signed)
 Complex Care Management   Visit Note  07/31/2024  Name:  Robert Sanchez MRN: [id number] DOB: [birth year]  Situation: Referral received for Complex Care Management related to SDOH Barriers:  Transportation Housing homelessness Financial Resource Strain Obtaining social security/retirement I obtained verbal consent from Patient.  Visit completed with Patient  on the phone  Background:   Past Medical History:  Diagnosis Date   Acute pulmonary edema (HCC) 06/05/2018   Arthritis    CHF (congestive heart failure) (HCC)    COPD (chronic obstructive pulmonary disease) (HCC)     beginning stages  per pt   Coronary artery disease    Diabetes mellitus without complication (HCC)    type 2   Diabetic neuropathy (HCC)    Diabetic neuropathy (HCC)    Emphysema/COPD     beginning stages   GERD (gastroesophageal reflux disease)    HLD (hyperlipidemia)    HOH (hard of hearing)    no hearing aids   Myocardial infarction [facility])    Peripheral vascular disease     Assessment: Robert Sanchez conducted f/u call with pt. Pt reports he is still staying with his longtime friend Robert Sanchez since hospital discharge. Pt reports needing to be out by last Friday, but not sure if his friend was able to get an extension for him to stay longer. Pt also reports he has another friend who has offered him a place to stay in his man-cave like building. Pt reports the place has power and a bed, but does not have heating/cooling and is worried about being there in the winter. Pt reports he plans to go there once he can no longer stay with his friend Robert Sanchez. Robert Sanchez and pt called medicaid contact center (ref #: [id number]) and confirmed pt has medicaid under [employer]. Pt was told to call DSS if he believes his name was incorrectly filed with Medicaid. Robert Sanchez provided pt that phone number. Robert Sanchez also provided pt phone number for Well Non-medical emergency transportation and instructed pt he can schedule rides for medicaid covered appts at no cost  at least 2 days before appt. Pt understood and was appreciative of support.   Robert Sanchez informed pt he received a [facility] back [facility] and was placed on a waitlist for their men shelter. Pt understood and appreciated support, but said he was not going to try anymore shelters since he had the option his friend is giving for temporarily shelter. No other resources were provided/requested at this time.   SDOH Interventions    Flowsheet Row Patient Outreach Telephone from 07/20/2024 in [place] POPULATION HEALTH DEPARTMENT Telephone from 07/16/2024 in [facility] Health Comm Health [place] - A Dept Of [place]. [facility] Office Visit from 01/10/2023 in [facility] Health Comm Health [place] - A Dept Of [place]. [facility] ED to Hosp-Admission (Discharged) from 08/27/2021 in [facility] 3E HF PCU  SDOH Interventions      Food Insecurity Interventions Intervention Not Indicated -- -- Intervention Not Indicated  Housing Interventions AMB Referral  [Urgent referral has been placed by PCP. message sent to Robert Sanchez] --  [provided the patient with the contact information for IRC] Intervention Not Indicated Intervention Not Indicated  Transportation Interventions Patient Resources (Friends/Family) Other (Comment)  [he said he relies on his friend to provide the rides to appointments.] Intervention Not Indicated Intervention Not Indicated  Utilities Interventions AMB Referral -- Intervention Not Indicated --  Alcohol Usage Interventions -- -- Intervention Not Indicated (Score <7) --  Financial Strain Interventions -- Other (Comment)  [He currently has no income. He said he has been in contact with SSA and is waiting on receiving his social security benefits.] Intervention Not Indicated Intervention Not Indicated  Physical Activity Interventions -- -- Intervention Not Indicated --  Stress Interventions -- -- Intervention Not Indicated --      Recommendation:   Continue medical  appts and call to schedule transportation in advance.   Follow Up Plan:   Telephone follow up appointment date/time:  08/09/2024 at 2:45pm   Robert Sanchez, Robert Sanchez [place]/VBCI - [facility] Social Worker [phone]

## 2024-08-01 NOTE — Patient Outreach (Signed)
 Care Coordination   08/01/2024 Name: COBEN GODSHALL MRN: [id number] DOB: [birth year]   Care Coordination Outreach Attempts:  An unsuccessful telephone outreach was attempted today to complete CCM follow-up visit.  Follow Up Plan:  Additional outreach attempts will be made to complete follow-up visit.   Encounter Outcome:  No Answer. HIPAA compliant voicemail left requesting return call.   Rosaline Finlay, RN MSN [place]  VBCI Population Health RN Care Manager Direct Dial : [phone]  Fax: [phone]

## 2024-08-02 NOTE — Patient Outreach (Signed)
 Care Coordination   08/02/2024 Name: Robert Sanchez MRN: [id number] DOB: 05-Sep-1956   Care Coordination Outreach Attempts:  A second unsuccessful outreach was attempted today to complete CCM follow-up visit.  Follow Up Plan:  Additional outreach attempts will be made to complete follow-up visit.   Encounter Outcome:  No Answer. HIPAA compliant voicemail left requesting return call.   Rosaline Finlay, RN MSN [place]  [facility] RN Care Manager Direct Dial : [phone]  Fax: [phone]

## 2024-08-06 NOTE — Patient Outreach (Signed)
 Care Coordination   08/06/2024 Name: Robert Sanchez MRN: [id number] DOB: [birth year]   Care Coordination Outreach Attempts: A third unsuccessful outreach was attempted today to complete CCM follow-up visit.   Follow Up Plan:  No further outreach attempts will be made at this time. We have been unable to contact the patient to complete follow-up visit.  Encounter Outcome:  No Answer. HIPAA compliant voicemail left requesting return call.   Rosaline Finlay, RN MSN [place]  [facility] Health RN Care Manager Direct Dial : [phone]  Fax: [phone]

## 2024-08-07 ENCOUNTER — Ambulatory Visit: Payer: Self-pay | Attending: Family Medicine

## 2024-08-09 ENCOUNTER — Other Ambulatory Visit: Payer: Self-pay

## 2024-08-09 NOTE — Patient Instructions (Signed)
 Visit Information  Robert Sanchez was given information about Medicaid Managed Care team care coordination services as a part of their [facility] Medicaid benefit.   If you would like to schedule transportation through your [facility] plan, please call the following number at least 2 days in advance of your appointment: [phone].   You can also use the MTM portal or MTM mobile app to manage your rides. Reimbursement for transportation is available through [facility]! For the portal, please go to [email or web address]  Call the [facility] Crisis Line at [phone], at any time, 24 hours a day, 7 days a week. If you are in danger or need immediate medical attention call 911.   The patient verbalized understanding of instructions, educational materials, and care plan provided today and DECLINED offer to receive copy of patient instructions, educational materials, and care plan.   Telephone follow up appointment with Managed Medicaid care management team member scheduled for: 08/27/2024 at 1:30pm  Laymon Doll, BSW [place]/VBCI - [facility] Social Worker [phone]   Following is a copy of your plan of care:  There are no care plans that you recently modified to display for this patient.

## 2024-08-09 NOTE — Patient Outreach (Signed)
 Complex Care Management   Visit Note  08/09/2024  Name:  Robert Sanchez MRN: [id number] DOB: 09-14-1956  Situation: Referral received for Complex Care Management related to SDOH Barriers:  Transportation Housing homelessness Financial Resource Strain Obtaining  social security/retirement I obtained verbal consent from Patient.  Visit completed with Patient  on the phone  Background:   Past Medical History:  Diagnosis Date   Acute pulmonary edema (HCC) 06/05/2018   Arthritis    CHF (congestive heart failure) (HCC)    COPD (chronic obstructive pulmonary disease) (HCC)     beginning stages  per pt   Coronary artery disease    Diabetes mellitus without complication (HCC)    type 2   Diabetic neuropathy (HCC)    Diabetic neuropathy (HCC)    Emphysema/COPD     beginning stages   GERD (gastroesophageal reflux disease)    HLD (hyperlipidemia)    HOH (hard of hearing)    no hearing aids   Myocardial infarction [facility])    Peripheral vascular disease     Assessment: BSW held f/u appt with pt. Pt was alert and cognitive. Pt states he continues to stay with his friend Tonya some days and then stays with his other friend a couple days. Pt reports he recently received his mail from old address and received a welcome book from [facility] along with his insurance card. BSW confirmed he will need insurance card for future use when scheduling transportation among other things. Pt understood and was informed of extra benefits he might be eligible for by calling the member services # on the back of that card. Pt understood. Pt states he is good on food right now since he has his FNS benefit. Pt reports he has an upcoming appt with SSA office in [place] next week. BSW informed pt RNCM Rosaline has been trying to reach him. Pt declined to get back on RNCM schedule and stated he had her phone number and VM and would giver her a call back. BSW communicated this to [facility] via in-basket message. No  other resources were provided/requested at this time.   SDOH Interventions    Flowsheet Row Patient Outreach Telephone from 08/09/2024 in [place] POPULATION HEALTH DEPARTMENT Patient Outreach Telephone from 07/20/2024 in [place] POPULATION HEALTH DEPARTMENT Telephone from 07/16/2024 in [facility] Health Comm Health [place] - A Dept Of [place]. [facility] Office Visit from 01/10/2023 in [facility] Health Comm Health [place] - A Dept Of [place] [facility]. [facility] ED to Hosp-Admission (Discharged) from 08/27/2021 in [place] [facility] 3E HF PCU  SDOH Interventions       Food Insecurity Interventions Intervention Not Indicated  [receive FNS benefit for now. Pt states he is good on food.] Intervention Not Indicated -- -- Intervention Not Indicated  Housing Interventions Community Resources Provided  [calls to shelters have been made and placed on waitlist. Pt is currently relying on two friends who provide him with housing for now.] AMB Referral  [Urgent referral has been placed by PCP. message sent to BSW] --  [provided the patient with the contact information for IRC] Intervention Not Indicated Intervention Not Indicated  Transportation Interventions Patient Resources (Friends/Family) Patient Resources (Friends/Family) Other (Comment)  [he said he relies on his friend to provide the rides to appointments.] Intervention Not Indicated Intervention Not Indicated  Utilities Interventions Patient Unable to Answer  [currently not paying for utilities.] AMB Referral -- Intervention Not Indicated --  Alcohol Usage Interventions -- -- -- Intervention  Not Indicated (Score <7) --  Financial Strain Interventions Other (Comment)  [Pt has an in-person appt with SSA next week.] -- Other (Comment)  [He currently has no income. He said he has been in contact with SSA and is waiting on receiving his social security benefits.] Intervention Not Indicated Intervention Not Indicated  Physical Activity Interventions --  -- -- Intervention Not Indicated --  Stress Interventions -- -- -- Intervention Not Indicated --      Recommendation:   Atttend SSA appt next week to apply for social security retirement.   Follow Up Plan:   Telephone follow up appointment date/time:  08/27/2024 at 1:30pm   Laymon Doll, BSW [place]/VBCI - [facility] Social Worker [phone]

## 2024-08-27 ENCOUNTER — Other Ambulatory Visit: Payer: Self-pay

## 2024-08-27 NOTE — Patient Instructions (Signed)
 Visit Information  Robert Sanchez was given information about Medicaid Managed Care team care coordination services as a part of their [facility] Medicaid benefit.   If you would like to schedule transportation through your [facility] plan, please call the following number at least 2 days in advance of your appointment: [phone].   You can also use the MTM portal or MTM mobile app to manage your rides. Reimbursement for transportation is available through [facility]! For the portal, please go to [email or web address]  Call the [facility] Crisis Line at [phone], at any time, 24 hours a day, 7 days a week. If you are in danger or need immediate medical attention call 911.   Care plan and visit instructions communicated with the patient verbally today. The patient DECLINED to receive copy of care plan and patient instructions in any format.   No further follow up required: patient declined further follow up calls at this time.   Laymon Doll, BSW [place]/VBCI - [employer] Social Worker [phone]   Following is a copy of your plan of care:  There are no care plans that you recently modified to display for this patient.

## 2024-08-27 NOTE — Patient Outreach (Signed)
 Social Drivers of Health  Community Resource and Care Coordination Visit Note   08/27/2024  Name: Robert Sanchez MRN: [id number] DOB:[birth year]  Situation: Referral received for [facility] needs assessment and assistance related to [employer] . I obtained verbal consent from Patient.  Visit completed with Patient on the phone.   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 Pt was alert and cognitive. Patient reports he was able to obtain his ss retirement benefit and is now fully retired. Patient states he was given his award letter along with 4 months of back pay which he confirmed he already received in his bank account. Pt states he will be getting his retirement benefit every 3rd wednesday of the month and is actively working with his bank to get direct deposit set up with his social security retirement benefit. Pt continues to stay with friend but now that he has money coming in and a SSA award letter he plans to secure housing before December. Pt declined further calls but stated he would reach out should his needs change. BSW confirmed that was okay and instructed pt he could also get connected again through his PCP for social work services. Pt understood and agreed. No additional needs or resources were provided/requested at this time.     Goals Addressed             This Visit's Progress    COMPLETED: BSW  VBCI Social Work Air Traffic Controller   On track    Problems:   Corporate Treasurer , Housing , and Transportation  CSW Clinical Goal(s):   Over the next 2 weeks the Patient will work with Child Psychotherapist to address concerns related to housing, financial strain, and transportation.  Interventions:  BSW will provided resources for SDOH needs.  Patient Goals/Self-Care Activities:  Secure housing or get into a shelter and avoid being on the street.   Plan:   Telephone follow up appointment with care management team member scheduled for:  07/31/2024 at 2pm.         Recommendation:   attend all scheduled provider appointments call for transportation assistance at least one week before appointments call the Suicide and Crisis Lifeline: 988 go to [facility] Urgent [facility] [address] [place] [phone]) call 911 if you need help  Follow Up Plan:   Patient declines further calls or assistance. [employer] will be closed. Patient has been provided contact information should new needs arise.   Robert Sanchez, BSW [place]/VBCI - [employer] Social Worker [phone]

## 2024-08-28 ENCOUNTER — Other Ambulatory Visit: Payer: Self-pay

## 2024-08-28 ENCOUNTER — Ambulatory Visit: Payer: Self-pay | Attending: Vascular Surgery | Admitting: Physician Assistant

## 2024-08-28 VITALS — BP 132/77 | HR 86 | Temp 98.0°F | Resp 18 | Ht 68.0 in | Wt 203.5 lb

## 2024-08-28 DIAGNOSIS — I739 Peripheral vascular disease, unspecified: Secondary | ICD-10-CM

## 2024-08-28 DIAGNOSIS — S81802S Unspecified open wound, left lower leg, sequela: Secondary | ICD-10-CM

## 2024-08-28 MED ORDER — GABAPENTIN 300 MG PO CAPS
600.0000 mg | ORAL_CAPSULE | Freq: Every day | ORAL | 2 refills | Status: DC
  Filled 2024-08-28: qty 60, 30d supply, fill #0

## 2024-08-28 NOTE — Progress Notes (Signed)
 POST OPERATIVE OFFICE NOTE    CC:  F/u for surgery  HPI:  Robert Sanchez is a 68 y.o. male who is here for wound check.  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 Magda on 06/29/2024.  This was done for traumatic injury to the left lower extremity resulting in transection of a previous bypass.  He returns today for follow-up.  At his last office visit he was transitioned to wet-to-dry dressing changes to his left leg wound.  His wound was healing well without signs of infection.  At follow-up today he is doing well.  He says over the past couple of days he has been leaving his wound open to air.  Prior to this he was performing daily wet-to-dry dressing changes.  He denies any signs of infection such as purulent drainage, fever, or chills.  He denies any rest pain in the left foot or tissue loss.  He does have neuropathy in the left leg, which is treated with gabapentin .  He is asking for a short refill of this medication since he has run out and will not see his PCP until the end of December.   Allergies  Allergen Reactions   Lisinopril  Other (See Comments)    Syncope   Codeine Rash    Current Outpatient Medications  Medication Sig Dispense Refill   albuterol  (VENTOLIN  HFA) 108 (90 Base) MCG/ACT inhaler Inhale 2 puffs into the lungs every 6 hours as needed for wheezing or shortness of breath. 6.7 g 1   aspirin  81 MG EC tablet Take 1 tablet (81 mg total) by mouth daily. Swallow whole.     atorvastatin  (LIPITOR ) 80 MG tablet Take 1 tablet (80 mg total) by mouth at bedtime. 90 tablet 1   Blood Glucose Monitoring Suppl (ONETOUCH VERIO) w/Device KIT Use to test blood sugars up to 4 times daily as directed. 1 kit 0   carvedilol  (COREG ) 6.25 MG tablet Take 1 tablet (6.25 mg total) by mouth 2 (two) times daily with a meal. 180 tablet 1    Dulaglutide  (TRULICITY ) 3 MG/0.5ML SOAJ Inject 3 mg as directed once a week. 2 mL 6   FLUoxetine  (PROZAC ) 10 MG capsule Take 1 capsule (10 mg total) by mouth daily. 90 capsule 0   furosemide  (LASIX ) 40 MG tablet Take 1 tablet (40 mg total) by mouth daily. 90 tablet 1   gabapentin  (NEURONTIN ) 300 MG capsule Take 2 capsules (600 mg total) by mouth at bedtime. 60 capsule 2   glucose blood (ONETOUCH VERIO) test strip Use to test blood sugars up to 4 times daily as directed. 100 each 2   insulin  glargine (LANTUS  SOLOSTAR) 100 UNIT/ML Solostar Pen Inject 42 Units into the skin daily. 30 mL 6   insulin  lispro (HUMALOG  KWIKPEN) 100 UNIT/ML KwikPen Inject 5 Units into the skin daily before supper. 9 mL 6   Insulin  Pen Needle (TRUEPLUS 5-BEVEL PEN NEEDLES) 32G X 4 MM MISC Use as directed with insulin  pen 200 each 2   Insulin  Syringe-Needle U-100 30G X 5/16 1 ML MISC Use to inject insulin  twice daily. 100 each 3   Lancets (ONETOUCH DELICA PLUS LANCET33G) MISC Use to test blood sugars up to 4 times daily as directed. 100 each 2   losartan  (COZAAR ) 25 MG tablet Take 0.5 tablets (12.5 mg total) by mouth at bedtime. 45 tablet 3  metFORMIN  (GLUCOPHAGE ) 500 MG tablet Take 2 tablets (1,000 mg total) by mouth 2 (two) times daily with a meal. 360 tablet 1   Multiple Vitamin (MULTIVITAMIN) tablet Take 1 tablet by mouth daily.     potassium chloride  (KLOR-CON ) 10 MEQ tablet Take 1 tablet (10 mEq total) by mouth daily. 90 tablet 1   spironolactone  (ALDACTONE ) 25 MG tablet Take 0.5 tablets (12.5 mg total) by mouth daily. 45 tablet 1   umeclidinium bromide  (INCRUSE ELLIPTA ) 62.5 MCG/ACT AEPB Inhale 1 puff into the lungs daily. 30 each 6   oxyCODONE  (OXY IR/ROXICODONE ) 5 MG immediate release tablet Take 1 tablet (5 mg total) by mouth every 6 (six) hours as needed for severe pain (pain score 7-10). (Patient not taking: Reported on 08/28/2024) 30 tablet 0   No current facility-administered medications for this visit.      ROS:  See HPI  Physical Exam:  Incision: Left lower leg wound healing appropriately with circumferential scabbing, no signs of infection Extremities: Brisk left PT Doppler signal Neuro: Intact motor and sensation of left lower extremity      Assessment/Plan:  This is a 68 y.o. 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  This was done for traumatic injury to the left leg resulting in transection of his bypass graft. - The patient was transitioned to wet-to-dry dressing changes at his last office visit.  He says he was doing those for the past couple of weeks up until a few days ago.  Over the last few days he has been keeping his wound clean, dry, and open to air.  He denies any purulent drainage, fevers, or chills - He denies any rest pain or tissue loss in the left lower extremity.  On exam he has a brisk left PT Doppler signal -His left lower leg wound is slowly making progress and healing.  He does have circumferential scabbing, likely due to leaving the wound open to air over the past couple of days.  There are no signs of infection.  I have encouraged the patient to continue daily wet-to-dry dressing changes until the wound becomes too small to pack. - I will send him a small refill of his gabapentin  for his neuropathy.  Once he sees his PCP at the end of December, they can resume his further refills. -He can return to clinic in 1 month for repeat wound check   Ahmed Holster, PA-C [facility] [phone]   Clinic MD:  Magda

## 2024-10-02 ENCOUNTER — Ambulatory Visit: Attending: Family Medicine

## 2024-10-16 ENCOUNTER — Other Ambulatory Visit: Payer: Self-pay

## 2024-10-16 ENCOUNTER — Other Ambulatory Visit (HOSPITAL_COMMUNITY): Payer: Self-pay

## 2024-10-18 ENCOUNTER — Other Ambulatory Visit: Payer: Self-pay

## 2024-10-24 ENCOUNTER — Other Ambulatory Visit: Payer: Self-pay

## 2024-10-24 ENCOUNTER — Ambulatory Visit: Payer: Self-pay | Attending: Family Medicine | Admitting: Family Medicine

## 2024-10-24 VITALS — BP 165/84 | HR 85 | Temp 98.0°F | Ht 68.0 in | Wt 218.8 lb

## 2024-10-24 DIAGNOSIS — F17210 Nicotine dependence, cigarettes, uncomplicated: Secondary | ICD-10-CM | POA: Diagnosis not present

## 2024-10-24 DIAGNOSIS — E1140 Type 2 diabetes mellitus with diabetic neuropathy, unspecified: Secondary | ICD-10-CM | POA: Insufficient documentation

## 2024-10-24 DIAGNOSIS — E785 Hyperlipidemia, unspecified: Secondary | ICD-10-CM | POA: Insufficient documentation

## 2024-10-24 DIAGNOSIS — E1169 Type 2 diabetes mellitus with other specified complication: Secondary | ICD-10-CM

## 2024-10-24 DIAGNOSIS — Z951 Presence of aortocoronary bypass graft: Secondary | ICD-10-CM | POA: Diagnosis present

## 2024-10-24 DIAGNOSIS — I10 Essential (primary) hypertension: Secondary | ICD-10-CM | POA: Diagnosis not present

## 2024-10-24 DIAGNOSIS — E1149 Type 2 diabetes mellitus with other diabetic neurological complication: Secondary | ICD-10-CM

## 2024-10-24 DIAGNOSIS — Z23 Encounter for immunization: Secondary | ICD-10-CM

## 2024-10-24 DIAGNOSIS — Z5982 Transportation insecurity: Secondary | ICD-10-CM | POA: Diagnosis not present

## 2024-10-24 DIAGNOSIS — H9193 Unspecified hearing loss, bilateral: Secondary | ICD-10-CM | POA: Diagnosis not present

## 2024-10-24 DIAGNOSIS — I152 Hypertension secondary to endocrine disorders: Secondary | ICD-10-CM | POA: Diagnosis not present

## 2024-10-24 DIAGNOSIS — Z89412 Acquired absence of left great toe: Secondary | ICD-10-CM | POA: Diagnosis not present

## 2024-10-24 DIAGNOSIS — E1159 Type 2 diabetes mellitus with other circulatory complications: Secondary | ICD-10-CM | POA: Insufficient documentation

## 2024-10-24 DIAGNOSIS — I2510 Atherosclerotic heart disease of native coronary artery without angina pectoris: Secondary | ICD-10-CM | POA: Diagnosis present

## 2024-10-24 DIAGNOSIS — Z9889 Other specified postprocedural states: Secondary | ICD-10-CM | POA: Insufficient documentation

## 2024-10-24 DIAGNOSIS — J439 Emphysema, unspecified: Secondary | ICD-10-CM | POA: Insufficient documentation

## 2024-10-24 DIAGNOSIS — E1151 Type 2 diabetes mellitus with diabetic peripheral angiopathy without gangrene: Secondary | ICD-10-CM | POA: Insufficient documentation

## 2024-10-24 DIAGNOSIS — I739 Peripheral vascular disease, unspecified: Secondary | ICD-10-CM | POA: Diagnosis not present

## 2024-10-24 DIAGNOSIS — Z794 Long term (current) use of insulin: Secondary | ICD-10-CM | POA: Insufficient documentation

## 2024-10-24 DIAGNOSIS — Z7985 Long-term (current) use of injectable non-insulin antidiabetic drugs: Secondary | ICD-10-CM | POA: Insufficient documentation

## 2024-10-24 DIAGNOSIS — I25810 Atherosclerosis of coronary artery bypass graft(s) without angina pectoris: Secondary | ICD-10-CM

## 2024-10-24 DIAGNOSIS — Z7984 Long term (current) use of oral hypoglycemic drugs: Secondary | ICD-10-CM | POA: Diagnosis not present

## 2024-10-24 DIAGNOSIS — J438 Other emphysema: Secondary | ICD-10-CM | POA: Diagnosis not present

## 2024-10-24 DIAGNOSIS — Z95820 Peripheral vascular angioplasty status with implants and grafts: Secondary | ICD-10-CM | POA: Insufficient documentation

## 2024-10-24 DIAGNOSIS — Z1211 Encounter for screening for malignant neoplasm of colon: Secondary | ICD-10-CM

## 2024-10-24 DIAGNOSIS — E119 Type 2 diabetes mellitus without complications: Secondary | ICD-10-CM | POA: Diagnosis present

## 2024-10-24 LAB — POCT GLYCOSYLATED HEMOGLOBIN (HGB A1C): HbA1c, POC (controlled diabetic range): 7.4 % — AB (ref 0.0–7.0)

## 2024-10-24 MED ORDER — LANTUS SOLOSTAR 100 UNIT/ML ~~LOC~~ SOPN
44.0000 [IU] | PEN_INJECTOR | Freq: Every day | SUBCUTANEOUS | 6 refills | Status: AC
  Filled 2024-10-24: qty 30, 68d supply, fill #0
  Filled 2024-11-22: qty 15, 34d supply, fill #0

## 2024-10-24 MED ORDER — GABAPENTIN 300 MG PO CAPS
600.0000 mg | ORAL_CAPSULE | Freq: Every day | ORAL | 1 refills | Status: AC
  Filled 2024-10-24: qty 180, 90d supply, fill #0

## 2024-10-24 MED ORDER — UMECLIDINIUM BROMIDE 62.5 MCG/ACT IN AEPB
1.0000 | INHALATION_SPRAY | Freq: Every day | RESPIRATORY_TRACT | 6 refills | Status: AC
  Filled 2024-10-24: qty 30, 30d supply, fill #0

## 2024-10-24 NOTE — Progress Notes (Signed)
 "  Subjective:  Patient ID: Robert Sanchez, male    DOB: 12-Sep-1956  Age: 68 y.o. MRN: [id number]  CC: Medical Management of Chronic Issues     Discussed the use of AI scribe software for clinical note transcription with the patient, who gave verbal consent to proceed.  History of Present Illness [name] is a 68 year old male with a history of Type 2 DM, CAD s/p CABG x3, PVD status post thrombectomy of left common femoral to PT composite bypass, with retrograde iliac angioplasty and revision of distal bypass to below the knee popliteal artery/TP trunk with a bovine pericardial patch angioplasty, subsequent thrombosis and angioplasty of distal anastomosis, partial L big toe amputation in 07/2020.  In 11/2021 he did undergo left femoral endarterectomy with profundoplasty bovine patch and a left common femoral to anterior tibial bypass with composite PTFE and great saphenous vein, tobacco abuse (1 pack/day x 50 years), on 3L oxygen (at night ), Emphysema who presents for a six-month follow-up.  He takes carvedilol  twice daily, losartan  12.5 mg daily, and spironolactone  for blood pressure, but he missed his doses this morning. He sometimes delays these medications due to stomach upset.  He is CAD stable with no chest pains. He has COPD with occasional shortness of breath and has not needed oxygen recently. He smokes small amounts of marijuana. He was not using his inhaler due to cost but now has Medicaid and Medicare, which may improve access.  His diabetes is treated with Lantus  42 units in the morning and 5 units at night plus metformin . His A1c has increased from 6.7 to 7.4, though pre-meal blood sugars have improved from 250-300 to 150-175. He has gained 10 pounds since the last visit and now weighs 218 pounds.  He has neuropathy, worse in the left leg. Gabapentin  helps, but he is currently out and needs a refill.  He was homeless for about two and a half months but now has housing  and receives tree surgeon. Prior financial constraints limited his ability to afford medications. He complains of hearing difficulties and would like to have that evaluated.    Past Medical History:  Diagnosis Date   Acute pulmonary edema (HCC) 06/05/2018   Arthritis    CHF (congestive heart failure) (HCC)    COPD (chronic obstructive pulmonary disease) (HCC)     beginning stages  per pt   Coronary artery disease    Diabetes mellitus without complication (HCC)    type 2   Diabetic neuropathy (HCC)    Diabetic neuropathy (HCC)    Emphysema/COPD     beginning stages   GERD (gastroesophageal reflux disease)    HLD (hyperlipidemia)    HOH (hard of hearing)    no hearing aids   Myocardial infarction [facility])    Peripheral vascular disease     Past Surgical History:  Procedure Laterality Date   ABDOMINAL AORTOGRAM W/LOWER EXTREMITY N/A 06/21/2018   Procedure: ABDOMINAL AORTOGRAM W/LOWER EXTREMITY;  Surgeon: Gretta Lonni PARAS, MD;  [facility];  Service: Cardiovascular;  Laterality: N/A;   ABDOMINAL AORTOGRAM W/LOWER EXTREMITY Left 01/16/2020   Procedure: ABDOMINAL AORTOGRAM W/LOWER EXTREMITY;  Surgeon: Gretta Lonni PARAS, MD;  [facility]: [facility];  Service: Cardiovascular;  Laterality: Left;   ABDOMINAL AORTOGRAM W/LOWER EXTREMITY N/A 07/09/2020   Procedure: ABDOMINAL AORTOGRAM W/LOWER EXTREMITY;  Surgeon: Gretta Lonni PARAS, MD;  [facility]: [facility];  Service: Cardiovascular;  Laterality: N/A;  TPA infusion  ABDOMINAL AORTOGRAM W/LOWER EXTREMITY Bilateral 11/20/2021   Procedure: ABDOMINAL AORTOGRAM W/LOWER EXTREMITY;  Surgeon: Magda Debby SAILOR, MD;  [facility]: [facility];  Service: Cardiovascular;  Laterality: Bilateral;   AMPUTATION Left 08/04/2020   Procedure: LEFT PARTIAL GREAT TOE AMPUTATION;  Surgeon: Gretta Lonni PARAS, MD;  Location: [facility] OR;  Service: Vascular;  Laterality: Left;   ANGIOPLASTY Left 11/23/2021   Procedure:  ANGIOPLASTY with BOVINE PATCH 1cmx6cm;  Surgeon: Gretta Lonni PARAS, MD;  Location: [facility] OR;  Service: Vascular;  Laterality: Left;   APPLICATION OF WOUND VAC Left 08/04/2020   Procedure: LEFT GROIN DEBRIDEMENT WITH APPLICATION OF WOUND VAC;  Surgeon: Gretta Lonni PARAS, MD;  Location: [facility] OR;  Service: Vascular;  Laterality: Left;   APPLICATION OF WOUND VAC Left 06/29/2024   Procedure: APPLICATION, WOUND VAC TO LEFT LOWER EXTREMITY;  Surgeon: Magda Debby SAILOR, MD;  [facility];  Service: Vascular;  Laterality: Left;   CORONARY ARTERY BYPASS GRAFT N/A 06/12/2018   Procedure: CORONARY ARTERY BYPASS GRAFTING (CABG) x 3 WITH ENDOSCOPIC HARVESTING OF RIGHT GREATER SAPHENOUS VEIN. LIMA TO LAD. SVG TO PD. SVG TO DIAGONAL.;  Surgeon: Fleeta Hanford Coy, MD;  Location: [facility] OR;  Service: Open Heart Surgery;  Laterality: N/A;   ENDARTERECTOMY FEMORAL Left 11/23/2021   Procedure: ENDARTERECTOMY FEMORAL;  Surgeon: Gretta Lonni PARAS, MD;  Location: [facility] OR;  Service: Vascular;  Laterality: Left;   FEMORAL-POPLITEAL BYPASS GRAFT Left 07/10/2020   Procedure: REDO EXPOSURE OF LEFT COMMON FEMORAL ARTERY LEFT COMMON FEMORAL TO POSTERIOR TIBIAL COMPOSITE BYPASS GRAFT;  Surgeon: Gretta Lonni PARAS, MD;  [facility];  Service: Vascular;  Laterality: Left;   FEMORAL-POPLITEAL BYPASS GRAFT Left 06/29/2024   Procedure: PARTIAL EXCISION OF BYPASS GRAFT  LEFT FEMORAL-ANTERIOR TIBIAL ARTERY;  Surgeon: Magda Debby SAILOR, MD;  [facility]: [facility];  Service: Vascular;  Laterality: Left;   FEMORAL-TIBIAL BYPASS GRAFT Left 08/21/2018   Procedure: BYPASS GRAFT LEFT FEMORAL TO POSTERIOR TIBIAL ARTERY USING LEFT REVERSED GREAT SAPHENOUS VEIN;  Surgeon: Gretta Lonni PARAS, MD;  [facility];  Service: Vascular;  Laterality: Left;   FEMORAL-TIBIAL BYPASS GRAFT Left 08/04/2020   Procedure: REVISION FEMORAL-DISTAL BYPASS WITH BIOLOGICAL BOVINE PATCH;  Surgeon: Gretta Lonni PARAS, MD;  Location: [facility] OR;  Service: Vascular;  Laterality:  Left;   FEMORAL-TIBIAL BYPASS GRAFT Left 11/23/2021   Procedure: REDO LEFT FEMORAL-TIBIAL ARTERY BYPASS GRAFT USING PROPATEN 6mm VASCULAR GRAFT REMOVABLE RING;  Surgeon: Gretta Lonni PARAS, MD;  Location: [facility] OR;  Service: Vascular;  Laterality: Left;  Insert arterial line   INCISION AND DRAINAGE OF WOUND Left 06/29/2024   Procedure: IRRIGATION AND EXCISIONAL DEBRIDEMENT OF TRAUMATIC WOUNDS OF LEFT LOWER EXTREMITY;  Surgeon: Magda Debby SAILOR, MD;  Location: [facility] OR;  Service: Vascular;  Laterality: Left;   KNEE ARTHROSCOPY     LOWER EXTREMITY ANGIOGRAM Left 08/04/2020   Procedure: LEFT ILIAC ANGIOGRAM, ANGIOPLASTY OF LEFT ILIAC STENT WITH DRUG COATED BALLOON;  Surgeon: Gretta Lonni PARAS, MD;  [facility];  Service: Vascular;  Laterality: Left;   LOWER EXTREMITY ANGIOGRAPHY Left 01/17/2020   Procedure: Lower Extremity Angiography;  Surgeon: Gretta Lonni PARAS, MD;  Location: [facility] INVASIVE CV LAB;  Service: Cardiovascular;  Laterality: Left;   LOWER EXTREMITY ANGIOGRAPHY N/A 01/07/2021   Procedure: LOWER EXTREMITY ANGIOGRAPHY;  Surgeon: Magda Debby SAILOR, MD;  [facility];  Service: Cardiovascular;  Laterality: N/A;   PERIPHERAL VASCULAR BALLOON ANGIOPLASTY Left 01/17/2020   Procedure: PERIPHERAL VASCULAR BALLOON ANGIOPLASTY;  Surgeon: Gretta Lonni PARAS, MD;  [facility];  Service: Cardiovascular;  Laterality: Left;  pt   PERIPHERAL VASCULAR INTERVENTION Left 06/21/2018   Procedure: PERIPHERAL VASCULAR INTERVENTION;  Surgeon: Gretta Lonni PARAS, MD;  [facility];  Service: Cardiovascular;  Laterality: Left;   PERIPHERAL VASCULAR INTERVENTION Left 01/08/2021   Procedure: PERIPHERAL VASCULAR INTERVENTION;  Surgeon: Gretta Lonni PARAS, MD;  [facility];  Service: Cardiovascular;  Laterality: Left;   PERIPHERAL VASCULAR THROMBECTOMY Left 01/16/2020   Procedure: PERIPHERAL VASCULAR THROMBECTOMY;  Surgeon: Gretta Lonni PARAS, MD;   [facility];  Service: Cardiovascular;  Laterality: Left;  Lytic Catheter Placement left fem-pop bypass   PERIPHERAL VASCULAR THROMBECTOMY Left 01/17/2020   Procedure: PERIPHERAL VASCULAR THROMBECTOMY;  Surgeon: Gretta Lonni PARAS, MD;  [facility];  Service: Cardiovascular;  Laterality: Left;  fem-pt bypass   PERIPHERAL VASCULAR THROMBECTOMY Left 07/10/2020   Procedure: lysis recheck;  Surgeon: Gretta Lonni PARAS, MD;  Location: [facility] INVASIVE CV LAB;  Service: Cardiovascular;  Laterality: Left;   PULMONARY THROMBECTOMY N/A 01/08/2021   Procedure: LYSIS RECHECK;  Surgeon: Gretta Lonni PARAS, MD;  [facility];  Service: Cardiovascular;  Laterality: N/A;   RIGHT/LEFT HEART CATH AND CORONARY ANGIOGRAPHY N/A 06/05/2018   Procedure: RIGHT/LEFT HEART CATH AND CORONARY ANGIOGRAPHY;  Surgeon: Claudene Pacific, MD;  [facility];  Service: Cardiovascular;  Laterality: N/A;   SPINE SURGERY     TEE WITHOUT CARDIOVERSION N/A 06/12/2018   Procedure: TRANSESOPHAGEAL ECHOCARDIOGRAM (TEE);  Surgeon: Fleeta Ochoa, Maude, MD;  Location: [facility] OR;  Service: Open Heart Surgery;  Laterality: N/A;   teeth extractions     THROMBECTOMY FEMORAL ARTERY Left 08/04/2020   Procedure: THROMBECTOMY OF LEFT FEMORAL TP COMPOSTITE BYPASS;  Surgeon: Gretta Lonni PARAS, MD;  [facility];  Service: Vascular;  Laterality: Left;   THROMBECTOMY ILIAC ARTERY Left 07/10/2020   Procedure: THROMBECTOMY OF LEFT EXTERNAL ILIAC AND LEFT COMMON FEMORAL AND LEFT POSTERIOR TIBIAL ARTERIES;  Surgeon: Gretta Lonni PARAS, MD;  [facility];  Service: Vascular;  Laterality: Left;   ULTRASOUND GUIDANCE FOR VASCULAR ACCESS Bilateral 11/23/2021   Procedure: ULTRASOUND GUIDANCE FOR VASCULAR ACCESS;  Surgeon: Gretta Lonni PARAS, MD;  Location: [facility] OR;  Service: Vascular;  Laterality: Bilateral;   VEIN HARVEST Left 08/21/2018   Procedure: VEIN HARVEST LEFT GREAT SAPHENOUS VEIN;  Surgeon:  Gretta Lonni PARAS, MD;  [facility];  Service: Vascular;  Laterality: Left;   VEIN HARVEST Right 11/23/2021   Procedure: VEIN HARVEST;  Surgeon: Gretta Lonni PARAS, MD;  Location: [facility] OR;  Service: Vascular;  Laterality: Right;    Family History  Problem Relation Age of Onset   Diabetes Mother    Lung disease Mother    Cancer Father    Heart disease Father     Social History   Socioeconomic History   Marital status: Single    Spouse name: Not on file   Number of children: 1   Years of education: Not on file   Highest education level: 10th grade  Occupational History   Not on file  Tobacco Use   Smoking status: Some Days    Current packs/day: 1.00    Average packs/day: 1 pack/day for 50.0 years (50.0 ttl pk-yrs)    Types: Cigarettes   Smokeless tobacco: Never   Tobacco comments:    1PPD x 50 years. 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   Vaping Use   Vaping status: Never Used  Substance and Sexual Activity   Alcohol use: Yes    Comment: occasional   Drug use: Yes    Types: Marijuana    Comment: Smokes Marijuana once a week   Sexual activity: Not on file  Other Topics Concern   Not on file  Social History Narrative   Not on file   Social Drivers of Health   Tobacco Use: High Risk (07/24/2024)   Patient History    Smoking Tobacco Use: Some Days    Smokeless Tobacco Use: Never    Passive Exposure: Not on file  Financial Resource Strain: High Risk (08/27/2024)   Overall Financial Resource Strain (CARDIA)    Difficulty of Paying Living Expenses: Hard  Food Insecurity: No Food Insecurity (08/27/2024)   Epic    Worried About Programme Researcher, Broadcasting/film/video in the Last Year: Never true    Ran Out of Food in the Last Year: Never true  Transportation Needs: Unmet Transportation Needs (08/27/2024)   Epic    Lack of Transportation (Medical): Yes    Lack of Transportation (Non-Medical): Yes  Physical Activity: Inactive (01/10/2023)   Exercise Vital Sign    Days of  Exercise per Week: 0 days    Minutes of Exercise per Session: 0 min  Stress: No Stress Concern Present (01/10/2023)   [employer] of [employer]ational [employer] - Occupational Stress Questionnaire    Feeling of Stress : Only a little  Recent Concern: Stress - Stress Concern Present (01/10/2023)   [employer] of Occupational [employer] - Occupational Stress Questionnaire    Feeling of Stress : To some extent  Social Connections: Patient Declined (06/30/2024)   Social Connection and Isolation Panel    Frequency of Communication with Friends and Family: Patient declined    Frequency of Social Gatherings with Friends and Family: Patient declined    Attends Religious Services: Patient declined    Active Member of Clubs or Organizations: Patient declined    Attends Banker Meetings: Patient declined    Marital Status: Patient declined  Depression (PHQ2-9): Low Risk (07/20/2024)   Depression (PHQ2-9)    PHQ-2 Score: 1  Alcohol Screen: Low Risk (01/10/2023)   Alcohol Screen    Last Alcohol Screening Score (AUDIT): 2  Housing: High Risk (08/27/2024)   Epic    Unable to Pay for Housing in the Last Year: Yes    Number of Times Moved in the Last Year: 1    Homeless in the Last Year: Yes  Utilities: Patient Unable To Answer (08/27/2024)   Epic    Threatened with loss of utilities: Patient unable to answer  Recent Concern: Utilities - At Risk (07/20/2024)   Epic    Threatened with loss of utilities: Yes  Health Literacy: Not on file    Allergies[1]  Outpatient Medications Prior to Visit  Medication Sig Dispense Refill   albuterol  (VENTOLIN  HFA) 108 (90 Base) MCG/ACT inhaler Inhale 2 puffs into the lungs every 6 hours as needed for wheezing or shortness of breath. 6.7 g 1   aspirin  81 MG EC tablet Take 1 tablet (81 mg total) by mouth daily. Swallow whole.     atorvastatin  (LIPITOR ) 80 MG tablet Take 1 tablet (80 mg total) by mouth at bedtime. 90 tablet 1   Blood Glucose Monitoring  Suppl (ONETOUCH VERIO) w/Device KIT Use to test blood sugars up to 4 times daily as directed. 1 kit 0   carvedilol  (COREG ) 6.25 MG tablet Take 1 tablet (6.25 mg total) by mouth 2 (  two) times daily with a meal. 180 tablet 1   Dulaglutide  (TRULICITY ) 3 MG/0.5ML SOAJ Inject 3 mg as directed once a week. 2 mL 6   FLUoxetine  (PROZAC ) 10 MG capsule Take 1 capsule (10 mg total) by mouth daily. 90 capsule 0   furosemide  (LASIX ) 40 MG tablet Take 1 tablet (40 mg total) by mouth daily. 90 tablet 1   gabapentin  (NEURONTIN ) 300 MG capsule Take 2 capsules (600 mg total) by mouth at bedtime. 60 capsule 2   glucose blood (ONETOUCH VERIO) test strip Use to test blood sugars up to 4 times daily as directed. 100 each 2   insulin  glargine (LANTUS  SOLOSTAR) 100 UNIT/ML Solostar Pen Inject 42 Units into the skin daily. 30 mL 6   insulin  lispro (HUMALOG  KWIKPEN) 100 UNIT/ML KwikPen Inject 5 Units into the skin daily before supper. 9 mL 6   Insulin  Pen Needle (TRUEPLUS 5-BEVEL PEN NEEDLES) 32G X 4 MM MISC Use as directed with insulin  pen 200 each 2   Insulin  Syringe-Needle U-100 30G X 5/16 1 ML MISC Use to inject insulin  twice daily. 100 each 3   Lancets (ONETOUCH DELICA PLUS LANCET33G) MISC Use to test blood sugars up to 4 times daily as directed. 100 each 2   losartan  (COZAAR ) 25 MG tablet Take 0.5 tablets (12.5 mg total) by mouth at bedtime. 45 tablet 3   metFORMIN  (GLUCOPHAGE ) 500 MG tablet Take 2 tablets (1,000 mg total) by mouth 2 (two) times daily with a meal. 360 tablet 1   Multiple Vitamin (MULTIVITAMIN) tablet Take 1 tablet by mouth daily.     oxyCODONE  (OXY IR/ROXICODONE ) 5 MG immediate release tablet Take 1 tablet (5 mg total) by mouth every 6 (six) hours as needed for severe pain (pain score 7-10). 30 tablet 0   potassium chloride  (KLOR-CON ) 10 MEQ tablet Take 1 tablet (10 mEq total) by mouth daily. 90 tablet 1   spironolactone  (ALDACTONE ) 25 MG tablet Take 0.5 tablets (12.5 mg total) by mouth daily. 45  tablet 1   umeclidinium bromide  (INCRUSE ELLIPTA ) 62.5 MCG/ACT AEPB Inhale 1 puff into the lungs daily. 30 each 6   No facility-administered medications prior to visit.     ROS Review of Systems  Constitutional:  Negative for activity change and appetite change.  HENT:  Positive for hearing loss. Negative for sinus pressure and sore throat.   Respiratory:  Negative for chest tightness, shortness of breath and wheezing.   Cardiovascular:  Negative for chest pain and palpitations.  Gastrointestinal:  Negative for abdominal distention, abdominal pain and constipation.  Genitourinary: Negative.   Musculoskeletal: Negative.   Psychiatric/Behavioral:  Negative for behavioral problems and dysphoric mood.     Objective:  BP (!) 158/63   Pulse 85   Temp 98 F (36.7 C) (Oral)   Ht 5' 8 (1.727 m)   Wt 218 lb 12.8 oz (99.2 kg)   SpO2 94%   BMI 33.27 kg/m      10/24/2024    8:38 AM 08/28/2024    9:30 AM 07/24/2024    2:32 PM  BP/Weight  Systolic BP 158 132 151  Diastolic BP 63 77 83  Wt. (Lbs) 218.8 203.48 208  BMI 33.27 kg/m2 30.94 kg/m2 31.63 kg/m2      Physical Exam Constitutional:      Appearance: He is well-developed.  HENT:     Right Ear: There is no impacted cerumen.     Left Ear: There is no impacted cerumen.  Cardiovascular:  Rate and Rhythm: Normal rate.     Heart sounds: Normal heart sounds. No murmur heard. Pulmonary:     Effort: Pulmonary effort is normal.     Breath sounds: Normal breath sounds. No wheezing or rales.  Chest:     Chest wall: No tenderness.  Abdominal:     General: Bowel sounds are normal. There is no distension.     Palpations: Abdomen is soft. There is no mass.     Tenderness: There is no abdominal tenderness.  Musculoskeletal:        General: Normal range of motion.     Right lower leg: No edema.     Left lower leg: No edema.  Neurological:     Mental Status: He is alert and oriented to person, place, and time.  Psychiatric:         Mood and Affect: Mood normal.        Latest Ref Rng & Units 07/07/2024    6:38 AM 07/06/2024    4:32 AM 07/05/2024    4:10 AM  CMP  Glucose 70 - 99 mg/dL 98  878  891   BUN 8 - 23 mg/dL 24  30  40   Creatinine 0.61 - 1.24 mg/dL 8.94  8.75  8.44   Sodium 135 - 145 mmol/L 138  135  132   Potassium 3.5 - 5.1 mmol/L 4.6  4.6  4.4   Chloride 98 - 111 mmol/L 104  107  100   CO2 22 - 32 mmol/L 23  21  20    Calcium  8.9 - 10.3 mg/dL 8.9  8.6  8.3     Lipid Panel     Component Value Date/Time   CHOL 150 04/24/2024 0928   TRIG 81 04/24/2024 0928   HDL 29 (L) 04/24/2024 0928   CHOLHDL 3.8 11/24/2021 0219   VLDL 20 11/24/2021 0219   LDLCALC 105 (H) 04/24/2024 0928    CBC    Component Value Date/Time   WBC 10.2 07/06/2024 0432   RBC 3.98 (L) 07/06/2024 0432   HGB 11.4 (L) 07/06/2024 0432   HCT 34.7 (L) 07/06/2024 0432   PLT 306 07/06/2024 0432   MCV 87.2 07/06/2024 0432   MCH 28.6 07/06/2024 0432   MCHC 32.9 07/06/2024 0432   RDW 16.0 (H) 07/06/2024 0432   LYMPHSABS 1.7 06/29/2024 1215   MONOABS 0.8 06/29/2024 1215   EOSABS 0.7 (H) 06/29/2024 1215   BASOSABS 0.1 06/29/2024 1215    Lab Results  Component Value Date   HGBA1C 7.4 (A) 10/24/2024    Lab Results  Component Value Date   HGBA1C 7.4 (A) 10/24/2024   HGBA1C 6.7 (H) 06/30/2024   HGBA1C 6.2 04/24/2024       Assessment & Plan Type 2 diabetes mellitus with diabetic neuropathy A1c increased to 7.4, indicating suboptimal glycemic control. Neuropathy symptoms present, particularly in the left leg. - Increased Lantus  to 44 units in the morning. - Continue 5 units of insulin  lispro before supper. - Refilled gabapentin  prescription. - Ordered urine microalbumin test. - Referred to eye doctor for diabetic retinopathy screening.  Pulmonary emphysema Occasional shortness of breath, no significant impact on daily activities. Has not obtained Incruse inhaler due to financial constraints. - Sent prescription for  Incruse inhaler to pharmacy.  Coronary artery disease post-CABG On carvedilol , losartan , and spironolactone . Missed doses contributed to elevated blood pressure. - Ensure adherence to carvedilol , losartan , and spironolactone . - No regimen change today as blood pressure was normal at his  last visit -Will reassess at next visit -Counseled on blood pressure goal of less than 130/80, low-sodium, DASH diet, medication compliance, 150 minutes of moderate intensity exercise per week. Discussed medication compliance, adverse effects.   PAD - Asymptomatic - Stable - Secondary risk factor modification including blood pressure, glycemic and lipid control - Continue statin   Hypertension associated with type 2 diabetes mellitus Blood pressure elevated due to missed doses of antihypertensive medications. Repeat reading still elevated. - Reassess blood pressure in 3 months.   Hyperlipidemia due to type 2 diabetes mellitus LDL slightly elevated above goal of less than 70 - Lipid panel at next visit Low-cholesterol diet - Continue statin  Bilateral hearing loss Reports difficulty hearing, possibly due to wax buildup. No wax observed. - Referred to audiologist for hearing test.  General Health Maintenance Due for colonoscopy screening for colon cancer. Screening for colon cancer- Referred for colonoscopy.      No orders of the defined types were placed in this encounter.   Follow-up: No follow-ups on file.       Corrina Sabin, MD, FAAFP. [facility] [place], [place] [phone]   10/24/2024, 8:59 AM     [1]  Allergies Allergen Reactions   Lisinopril  Other (See Comments)    Syncope   Codeine Rash   "

## 2024-10-25 LAB — MICROALBUMIN / CREATININE URINE RATIO
Creatinine, Urine: 57.8 mg/dL
Microalb/Creat Ratio: 310 mg/g{creat} — ABNORMAL HIGH (ref 0–29)
Microalbumin, Urine: 179.1 ug/mL

## 2024-10-26 ENCOUNTER — Ambulatory Visit: Payer: Self-pay | Admitting: Internal Medicine

## 2024-10-26 ENCOUNTER — Other Ambulatory Visit: Payer: Self-pay

## 2024-10-26 DIAGNOSIS — I152 Hypertension secondary to endocrine disorders: Secondary | ICD-10-CM

## 2024-10-26 MED ORDER — LOSARTAN POTASSIUM 25 MG PO TABS
25.0000 mg | ORAL_TABLET | Freq: Every day | ORAL | 1 refills | Status: AC
  Filled 2024-10-26: qty 60, 60d supply, fill #0

## 2024-11-06 ENCOUNTER — Other Ambulatory Visit: Payer: Self-pay

## 2024-11-22 ENCOUNTER — Other Ambulatory Visit: Payer: Self-pay

## 2024-11-23 ENCOUNTER — Other Ambulatory Visit: Payer: Self-pay

## 2025-01-22 ENCOUNTER — Ambulatory Visit: Payer: Self-pay | Admitting: Family Medicine
